# Patient Record
Sex: Female | Born: 1985 | Race: Black or African American | Hispanic: No | Marital: Married | Smoking: Never smoker
Health system: Southern US, Community
[De-identification: ages and names within clinical notes are randomized; demographics above are authoritative.]

## PROBLEM LIST (undated history)

## (undated) ENCOUNTER — Inpatient Hospital Stay (HOSPITAL_COMMUNITY): Payer: Medicaid Other

## (undated) DIAGNOSIS — E109 Type 1 diabetes mellitus without complications: Secondary | ICD-10-CM

## (undated) DIAGNOSIS — E11319 Type 2 diabetes mellitus with unspecified diabetic retinopathy without macular edema: Secondary | ICD-10-CM

## (undated) DIAGNOSIS — B999 Unspecified infectious disease: Secondary | ICD-10-CM

## (undated) DIAGNOSIS — I10 Essential (primary) hypertension: Secondary | ICD-10-CM

## (undated) DIAGNOSIS — T8859XA Other complications of anesthesia, initial encounter: Secondary | ICD-10-CM

## (undated) DIAGNOSIS — N390 Urinary tract infection, site not specified: Secondary | ICD-10-CM

## (undated) HISTORY — PX: OTHER SURGICAL HISTORY: SHX169

## (undated) HISTORY — DX: Type 2 diabetes mellitus with unspecified diabetic retinopathy without macular edema: E11.319

## (undated) HISTORY — DX: Urinary tract infection, site not specified: N39.0

---

## 2016-11-23 HISTORY — PX: FOOT SURGERY: SHX648

## 2017-11-23 HISTORY — PX: HAND SURGERY: SHX662

## 2019-10-26 ENCOUNTER — Other Ambulatory Visit: Payer: Self-pay

## 2019-10-26 ENCOUNTER — Encounter (HOSPITAL_COMMUNITY): Payer: Self-pay | Admitting: Emergency Medicine

## 2019-10-26 ENCOUNTER — Emergency Department (HOSPITAL_COMMUNITY)
Admission: EM | Admit: 2019-10-26 | Discharge: 2019-10-26 | Disposition: A | Discharge status: Home / Self Care | Payer: Self-pay | Attending: Emergency Medicine | Admitting: Emergency Medicine

## 2019-10-26 DIAGNOSIS — N3000 Acute cystitis without hematuria: Secondary | ICD-10-CM | POA: Insufficient documentation

## 2019-10-26 DIAGNOSIS — R739 Hyperglycemia, unspecified: Secondary | ICD-10-CM

## 2019-10-26 DIAGNOSIS — E1065 Type 1 diabetes mellitus with hyperglycemia: Secondary | ICD-10-CM | POA: Insufficient documentation

## 2019-10-26 DIAGNOSIS — R112 Nausea with vomiting, unspecified: Secondary | ICD-10-CM | POA: Insufficient documentation

## 2019-10-26 HISTORY — DX: Type 1 diabetes mellitus without complications: E10.9

## 2019-10-26 LAB — CBC
HCT: 48.7 % — ABNORMAL HIGH (ref 36.0–46.0)
Hemoglobin: 16 g/dL — ABNORMAL HIGH (ref 12.0–15.0)
MCH: 28.4 pg (ref 26.0–34.0)
MCHC: 32.9 g/dL (ref 30.0–36.0)
MCV: 86.3 fL (ref 80.0–100.0)
Platelets: 271 10*3/uL (ref 150–400)
RBC: 5.64 MIL/uL — ABNORMAL HIGH (ref 3.87–5.11)
RDW: 12.9 % (ref 11.5–15.5)
WBC: 5.4 10*3/uL (ref 4.0–10.5)
nRBC: 0 % (ref 0.0–0.2)

## 2019-10-26 LAB — URINALYSIS, ROUTINE W REFLEX MICROSCOPIC
Bilirubin Urine: NEGATIVE
Glucose, UA: >=500 mg/dL — AB
Ketones, ur: 80 mg/dL — AB
Nitrite: POSITIVE — AB
Protein, ur: 30 mg/dL — AB
Specific Gravity, Urine: 1.036 — ABNORMAL HIGH (ref 1.005–1.030)
pH: 5 (ref 5.0–8.0)

## 2019-10-26 LAB — COMPREHENSIVE METABOLIC PANEL
ALT: 13 U/L (ref 0–44)
AST: 12 U/L — ABNORMAL LOW (ref 15–41)
Albumin: 3.8 g/dL (ref 3.5–5.0)
Alkaline Phosphatase: 112 U/L (ref 38–126)
Anion gap: 14 (ref 5–15)
BUN: 12 mg/dL (ref 6–20)
CO2: 20 mmol/L — ABNORMAL LOW (ref 22–32)
Calcium: 9.2 mg/dL (ref 8.9–10.3)
Chloride: 102 mmol/L (ref 98–111)
Creatinine, Ser: 0.55 mg/dL (ref 0.44–1.00)
GFR calc Af Amer: >60 mL/min (ref >60)
GFR calc non Af Amer: >60 mL/min (ref >60)
Glucose, Bld: 386 mg/dL — ABNORMAL HIGH (ref 70–99)
Potassium: 3.8 mmol/L (ref 3.5–5.1)
Sodium: 136 mmol/L (ref 135–145)
Total Bilirubin: 0.8 mg/dL (ref 0.3–1.2)
Total Protein: 7.9 g/dL (ref 6.5–8.1)

## 2019-10-26 LAB — CBG MONITORING, ED
Glucose-Capillary: 365 mg/dL — ABNORMAL HIGH (ref 70–99)
Glucose-Capillary: 315 mg/dL — ABNORMAL HIGH (ref 70–99)
Glucose-Capillary: 263 mg/dL — ABNORMAL HIGH (ref 70–99)

## 2019-10-26 LAB — I-STAT BETA HCG BLOOD, ED (MC, WL, AP ONLY): I-stat hCG, quantitative: <5 m[IU]/mL (ref <5)

## 2019-10-26 LAB — LIPASE, BLOOD: Lipase: 17 U/L (ref 11–51)

## 2019-10-26 MED ORDER — METOCLOPRAMIDE HCL 10 MG PO TABS: 10.0000 mg | ORAL_TABLET | Freq: Four times a day (QID) | ORAL | 0 refills | Status: DC

## 2019-10-26 MED ORDER — ONDANSETRON HCL 4 MG/2ML IJ SOLN
4.0000 mg | Freq: Once | INTRAMUSCULAR | Status: AC
  Administered 2019-10-26: 4 mg via INTRAVENOUS
  Filled 2019-10-26: qty 2

## 2019-10-26 MED ORDER — METOCLOPRAMIDE HCL 5 MG/ML IJ SOLN
10.0000 mg | Freq: Once | INTRAMUSCULAR | Status: AC
  Administered 2019-10-26: 10 mg via INTRAVENOUS
  Filled 2019-10-26: qty 2

## 2019-10-26 MED ORDER — INSULIN ASPART 100 UNIT/ML ~~LOC~~ SOLN
8.0000 [IU] | Freq: Once | SUBCUTANEOUS | Status: AC
  Administered 2019-10-26: 8 [IU] via SUBCUTANEOUS

## 2019-10-26 MED ORDER — SODIUM CHLORIDE 0.9 % IV BOLUS
1000.0000 mL | Freq: Once | INTRAVENOUS | Status: AC
  Administered 2019-10-26: 1000 mL via INTRAVENOUS

## 2019-10-26 MED ORDER — CEPHALEXIN 500 MG PO CAPS: ORAL_CAPSULE | ORAL | 0 refills | Status: DC

## 2019-10-26 NOTE — ED Notes (Signed)
cbg - 263 

## 2019-10-26 NOTE — ED Triage Notes (Signed)
Pt arrives POV for eval of vomiting since last night. Pt reports she is type I diabetic and noted that she started feeling badly last night, endorses dizziness and malaise, denies pain. Reports nearly continuous vomiting since that time. CBG 365 in triage, states she takes subQ insulin, does not wear a pump

## 2019-10-26 NOTE — ED Provider Notes (Signed)
[facility] EMERGENCY DEPARTMENT Provider Note   CSN: [id number] Arrival date & time: 10/26/19  1159     History   Chief Complaint Chief Complaint  Patient presents with  . Emesis    HPI Kristin Clay is a 33 y.o. female presenting for evaluation of nausea and vomiting.   History obtained with [facility] interpretive services as patient speaks Arabic.  Patient states she has had nausea and vomiting for the past 3 days.  Initially, she was having a few episodes of emesis in the morning, but none in the afternoon.  Over the past 24 hours, she has had persistent nausea and vomiting, has not been able to tolerate any p.o.  She denies fevers, chills, chest pain, shortness breath, cough, urinary symptoms, abnormal bowel movements.  She then however states that she is urinating more frequently and does feel very thirsty.  She has not checked her blood sugar in about a day and a half, it was 250 last time she checked.  She is not taken insulin for the past couple days, as she was vomiting and not able to tolerate p.o.  She does have a history of type 1 diabetes, takes insulin for this.  She has no other medical problems, takes no other medications daily.  She denies sick contacts.     HPI  Past Medical History:  Diagnosis Date  . Type I diabetes mellitus (HCC)     There are no active problems to display for this patient.   History reviewed. No pertinent surgical history.   OB History   No obstetric history on file.      Home Medications    Prior to Admission medications   Not on File    Family History History reviewed. No pertinent family history.  Social History Social History   Tobacco Use  . Smoking status: Never Smoker  . Smokeless tobacco: Never Used  Substance Use Topics  . Alcohol use: Never    Frequency: Never  . Drug use: Never     Allergies   Patient has no known allergies.   Review of Systems Review of Systems   Gastrointestinal: Positive for nausea and vomiting.  Endocrine: Positive for polydipsia and polyuria.  All other systems reviewed and are negative.    Physical Exam Updated Vital Signs BP 133/86 (BP Location: Right Arm)   Pulse (!) 105   Temp 98.1 F (36.7 C) (Oral)   Resp 17   Ht 4\' 11"  (1.499 m)   Wt 50 kg   SpO2 100%   BMI 22.26 kg/m   Physical Exam Vitals signs and nursing note reviewed.  Constitutional:      General: She is not in acute distress.    Appearance: She is well-developed.     Comments: Appears tired, but nontoxic.  HENT:     Head: Normocephalic and atraumatic.     Mouth/Throat:     Mouth: Mucous membranes are dry.     Comments: MM dry Eyes:     Extraocular Movements: Extraocular movements intact.     Conjunctiva/sclera: Conjunctivae normal.     Pupils: Pupils are equal, round, and reactive to light.  Neck:     Musculoskeletal: Normal range of motion and neck supple.  Cardiovascular:     Rate and Rhythm: Regular rhythm. Tachycardia present.     Pulses: Normal pulses.     Comments: Tachycardic around 110 Pulmonary:     Effort: Pulmonary effort is normal. No respiratory distress.  Breath sounds: Normal breath sounds. No wheezing.  Abdominal:     General: There is no distension.     Palpations: Abdomen is soft. There is no mass.     Tenderness: There is abdominal tenderness. There is no guarding or rebound.     Comments: ttp of epigastric abd. No rigidity, guarding, distention.  Negative rebound.  No peritonitis.  Musculoskeletal: Normal range of motion.  Skin:    General: Skin is warm and dry.     Capillary Refill: Capillary refill takes less than 2 seconds.  Neurological:     Mental Status: She is alert and oriented to person, place, and time.      ED Treatments / Results  Labs (all labs ordered are listed, but only abnormal results are displayed) Labs Reviewed  COMPREHENSIVE METABOLIC PANEL - Abnormal; Notable for the following  components:      Result Value   CO2 20 (*)    Glucose, Bld 386 (*)    AST 12 (*)    All other components within normal limits  CBC - Abnormal; Notable for the following components:   RBC 5.64 (*)    Hemoglobin 16.0 (*)    HCT 48.7 (*)    All other components within normal limits  CBG MONITORING, ED - Abnormal; Notable for the following components:   Glucose-Capillary 365 (*)    All other components within normal limits  LIPASE, BLOOD  URINALYSIS, ROUTINE W REFLEX MICROSCOPIC  I-STAT BETA HCG BLOOD, ED ([facility], [facility], [facility] ONLY)    EKG None  Radiology No results found.  Procedures Procedures (including critical care time)  Medications Ordered in ED Medications  metoCLOPramide (REGLAN) injection 10 mg (has no administration in time range)  sodium chloride 0.9 % bolus 1,000 mL (1,000 mLs Intravenous New Bag/Given 10/26/19 1252)  ondansetron (ZOFRAN) injection 4 mg (4 mg Intravenous Given 10/26/19 1254)  insulin aspart (novoLOG) injection 8 Units (8 Units Subcutaneous Given 10/26/19 1432)     Initial Impression / Assessment and Plan / ED Course  I have reviewed the triage vital signs and the nursing notes.  Pertinent labs & imaging results that were available during my care of the patient were reviewed by me and considered in my medical decision making (see chart for details).        Pt presenting for evaluation of nausea and vomiting.  Physical exam shows patient who appears nontoxic.  She does appear slightly dehydrated, and is tachycardic.  As she has type 1 diabetes and has not been checking her blood sugar regularly, consider possible DKA.  Also consider HHS.  Consider pancreatitis.  Consider gastroenteritis.  Will obtain labs, give Zofran and fluids for symptom control.  As patient is without significant abdominal pain except with palpation, will hold on imaging at this time.  Labs overall reassuring.  Glucose elevated at 380, but no DKA.  No leukocytosis.  Lipase normal.  I do  not believe she needs CT imaging at this time.  On reassessment, patient continues to have emesis.  Will give Reglan and continue to hydrate.  Insulin given, will recheck cbg in 1 hour.  Repeat CBG improving, 315.  Patient reports improvement with Reglan.  Requesting something to drink, will give water for p.o. challenge.  Tolerating water without difficulty. UA pending. Will likely be able to d/c pt when urine returns and after fluid is finished.   Oncoming team made aware pt's plan for d/c pending UA. Discussed findings and plan with pt, who  agrees to plan.   Final Clinical Impressions(s) / ED Diagnoses   Final diagnoses:  None    ED Discharge Orders    None       Alveria ApleyCaccavale, Nakyiah Kuck, PA-C 10/26/19 1615    Margarita Grizzleay, Danielle, MD 10/27/19 [phone]

## 2019-10-26 NOTE — ED Provider Notes (Signed)
  Patient taken at shift handoff from PA Caccavale Here for n/v Type 1 diabetic. Awaiting urine and repeat cbg.  Patient's labs returned show urinary tract infection.  Urine sent for culture.  Patient being discharged with Reglan.  We will add Keflex for coverage.  No evidence of pyelonephritis.  Patient appears appropriate for discharge at this time.  She is having no active vomiting and feels improved.  Discussed return precautions.  Translator utilized   Margarita Mail, PA-C 10/26/19 2244    Davonna Belling, MD 10/26/19 [phone]

## 2019-10-26 NOTE — ED Notes (Signed)
Patient verbalizes understanding of discharge instructions. Opportunity for questioning and answers were provided. Armband removed by staff, pt discharged from ED ambulatory.   

## 2019-10-26 NOTE — Discharge Instructions (Addendum)
Continue taking insulin as prescribed. Use Reglan as needed for nausea and vomiting. Make sure you are staying hydrated with water. Follow-up with your primary care doctor early next week for reevaluation of your symptoms. Return to the emergency room if you develop persistent vomiting, severe worsening abdominal pain, or any new, worsening, or concerning symptoms.  Contact a health care provider if: Your symptoms do not get better after 1-2 days. Your symptoms go away and then return. Get help right away if you have: Severe pain in your back or your lower abdomen. A fever. Nausea or vomiting.

## 2019-10-27 ENCOUNTER — Other Ambulatory Visit: Payer: Self-pay

## 2019-10-27 ENCOUNTER — Inpatient Hospital Stay (HOSPITAL_COMMUNITY)
Admission: EM | Admit: 2019-10-27 | Discharge: 2019-10-29 | DRG: 638 | Disposition: A | Discharge status: Home / Self Care | Payer: Self-pay | Attending: Family Medicine | Admitting: Family Medicine

## 2019-10-27 ENCOUNTER — Encounter (HOSPITAL_COMMUNITY): Payer: Self-pay | Admitting: Student

## 2019-10-27 DIAGNOSIS — N898 Other specified noninflammatory disorders of vagina: Secondary | ICD-10-CM | POA: Diagnosis present

## 2019-10-27 DIAGNOSIS — E1110 Type 2 diabetes mellitus with ketoacidosis without coma: Secondary | ICD-10-CM | POA: Diagnosis present

## 2019-10-27 DIAGNOSIS — B9620 Unspecified Escherichia coli [E. coli] as the cause of diseases classified elsewhere: Secondary | ICD-10-CM | POA: Diagnosis present

## 2019-10-27 DIAGNOSIS — Y92009 Unspecified place in unspecified non-institutional (private) residence as the place of occurrence of the external cause: Secondary | ICD-10-CM

## 2019-10-27 DIAGNOSIS — Z23 Encounter for immunization: Secondary | ICD-10-CM

## 2019-10-27 DIAGNOSIS — Z794 Long term (current) use of insulin: Secondary | ICD-10-CM

## 2019-10-27 DIAGNOSIS — T383X6A Underdosing of insulin and oral hypoglycemic [antidiabetic] drugs, initial encounter: Secondary | ICD-10-CM | POA: Diagnosis present

## 2019-10-27 DIAGNOSIS — Z91128 Patient's intentional underdosing of medication regimen for other reason: Secondary | ICD-10-CM

## 2019-10-27 DIAGNOSIS — R55 Syncope and collapse: Secondary | ICD-10-CM | POA: Insufficient documentation

## 2019-10-27 DIAGNOSIS — E1043 Type 1 diabetes mellitus with diabetic autonomic (poly)neuropathy: Secondary | ICD-10-CM | POA: Diagnosis present

## 2019-10-27 DIAGNOSIS — Z91018 Allergy to other foods: Secondary | ICD-10-CM

## 2019-10-27 DIAGNOSIS — E860 Dehydration: Secondary | ICD-10-CM | POA: Diagnosis present

## 2019-10-27 DIAGNOSIS — R112 Nausea with vomiting, unspecified: Secondary | ICD-10-CM | POA: Diagnosis present

## 2019-10-27 DIAGNOSIS — N390 Urinary tract infection, site not specified: Secondary | ICD-10-CM | POA: Diagnosis present

## 2019-10-27 DIAGNOSIS — T3696XA Underdosing of unspecified systemic antibiotic, initial encounter: Secondary | ICD-10-CM | POA: Diagnosis present

## 2019-10-27 DIAGNOSIS — E1010 Type 1 diabetes mellitus with ketoacidosis without coma: Principal | ICD-10-CM | POA: Diagnosis present

## 2019-10-27 DIAGNOSIS — Z20828 Contact with and (suspected) exposure to other viral communicable diseases: Secondary | ICD-10-CM | POA: Diagnosis present

## 2019-10-27 DIAGNOSIS — K3184 Gastroparesis: Secondary | ICD-10-CM | POA: Diagnosis present

## 2019-10-27 LAB — POCT I-STAT EG7
Acid-base deficit: 8 mmol/L — ABNORMAL HIGH (ref 0.0–2.0)
Bicarbonate: 15.1 mmol/L — ABNORMAL LOW (ref 20.0–28.0)
Calcium, Ion: 1.07 mmol/L — ABNORMAL LOW (ref 1.15–1.40)
HCT: 46 % (ref 36.0–46.0)
Hemoglobin: 15.6 g/dL — ABNORMAL HIGH (ref 12.0–15.0)
O2 Saturation: 99 %
Potassium: 3.7 mmol/L (ref 3.5–5.1)
Sodium: 140 mmol/L (ref 135–145)
TCO2: 16 mmol/L — ABNORMAL LOW (ref 22–32)
pCO2, Ven: 25.7 mmHg — ABNORMAL LOW (ref 44.0–60.0)
pH, Ven: 7.376 (ref 7.250–7.430)
pO2, Ven: 159 mmHg — ABNORMAL HIGH (ref 32.0–45.0)

## 2019-10-27 LAB — COMPREHENSIVE METABOLIC PANEL
ALT: 14 U/L (ref 0–44)
AST: 16 U/L (ref 15–41)
Albumin: 3.8 g/dL (ref 3.5–5.0)
Alkaline Phosphatase: 106 U/L (ref 38–126)
Anion gap: 18 — ABNORMAL HIGH (ref 5–15)
BUN: 11 mg/dL (ref 6–20)
CO2: 13 mmol/L — ABNORMAL LOW (ref 22–32)
Calcium: 8.4 mg/dL — ABNORMAL LOW (ref 8.9–10.3)
Chloride: 105 mmol/L (ref 98–111)
Creatinine, Ser: 0.49 mg/dL (ref 0.44–1.00)
GFR calc Af Amer: >60 mL/min (ref >60)
GFR calc non Af Amer: >60 mL/min (ref >60)
Glucose, Bld: 289 mg/dL — ABNORMAL HIGH (ref 70–99)
Potassium: 3.7 mmol/L (ref 3.5–5.1)
Sodium: 136 mmol/L (ref 135–145)
Total Bilirubin: 1 mg/dL (ref 0.3–1.2)
Total Protein: 7.7 g/dL (ref 6.5–8.1)

## 2019-10-27 LAB — CBC WITH DIFFERENTIAL/PLATELET
Abs Immature Granulocytes: 0.09 10*3/uL — ABNORMAL HIGH (ref 0.00–0.07)
Basophils Absolute: 0 10*3/uL (ref 0.0–0.1)
Basophils Relative: 0 %
Eosinophils Absolute: 0 10*3/uL (ref 0.0–0.5)
Eosinophils Relative: 0 %
HCT: 46.2 % — ABNORMAL HIGH (ref 36.0–46.0)
Hemoglobin: 15.4 g/dL — ABNORMAL HIGH (ref 12.0–15.0)
Immature Granulocytes: 1 %
Lymphocytes Relative: 9 %
Lymphs Abs: 0.8 10*3/uL (ref 0.7–4.0)
MCH: 28.7 pg (ref 26.0–34.0)
MCHC: 33.3 g/dL (ref 30.0–36.0)
MCV: 86.2 fL (ref 80.0–100.0)
Monocytes Absolute: 0.2 10*3/uL (ref 0.1–1.0)
Monocytes Relative: 3 %
Neutro Abs: 7.6 10*3/uL (ref 1.7–7.7)
Neutrophils Relative %: 87 %
Platelets: 287 10*3/uL (ref 150–400)
RBC: 5.36 MIL/uL — ABNORMAL HIGH (ref 3.87–5.11)
RDW: 13 % (ref 11.5–15.5)
WBC: 8.7 10*3/uL (ref 4.0–10.5)
nRBC: 0 % (ref 0.0–0.2)

## 2019-10-27 LAB — URINALYSIS, ROUTINE W REFLEX MICROSCOPIC
Bilirubin Urine: NEGATIVE
Glucose, UA: >=500 mg/dL — AB
Hgb urine dipstick: NEGATIVE
Ketones, ur: 80 mg/dL — AB
Leukocytes,Ua: NEGATIVE
Nitrite: NEGATIVE
Protein, ur: 100 mg/dL — AB
Specific Gravity, Urine: 1.028 (ref 1.005–1.030)
pH: 5 (ref 5.0–8.0)

## 2019-10-27 LAB — I-STAT BETA HCG BLOOD, ED (MC, WL, AP ONLY): I-stat hCG, quantitative: <5 m[IU]/mL (ref <5)

## 2019-10-27 LAB — CBG MONITORING, ED
Glucose-Capillary: 259 mg/dL — ABNORMAL HIGH (ref 70–99)
Glucose-Capillary: 238 mg/dL — ABNORMAL HIGH (ref 70–99)
Glucose-Capillary: 216 mg/dL — ABNORMAL HIGH (ref 70–99)

## 2019-10-27 LAB — LIPASE, BLOOD: Lipase: 19 U/L (ref 11–51)

## 2019-10-27 MED ORDER — ENOXAPARIN SODIUM 40 MG/0.4ML ~~LOC~~ SOLN: 40.0000 mg | SUBCUTANEOUS | Status: DC

## 2019-10-27 MED ORDER — ENOXAPARIN SODIUM 40 MG/0.4ML ~~LOC~~ SOLN: 40.0000 mg | Freq: Every day | SUBCUTANEOUS | Status: DC

## 2019-10-27 MED ORDER — DEXTROSE 50 % IV SOLN: 0.0000 mL | INTRAVENOUS | Status: DC | PRN

## 2019-10-27 MED ORDER — FAMOTIDINE IN NACL 20-0.9 MG/50ML-% IV SOLN
20.0000 mg | Freq: Once | INTRAVENOUS | Status: AC
  Administered 2019-10-27: 20 mg via INTRAVENOUS
  Filled 2019-10-27: qty 50

## 2019-10-27 MED ORDER — SODIUM CHLORIDE 0.9 % IV SOLN: 1.0000 g | INTRAVENOUS | Status: DC

## 2019-10-27 MED ORDER — SODIUM CHLORIDE 0.9 % IV SOLN
1.0000 g | Freq: Once | INTRAVENOUS | Status: AC
  Administered 2019-10-27: 22:00:00 1 g via INTRAVENOUS
  Filled 2019-10-27: qty 10

## 2019-10-27 MED ORDER — DEXTROSE-NACL 5-0.45 % IV SOLN
INTRAVENOUS | Status: DC
  Administered 2019-10-27: 75 mL/h via INTRAVENOUS

## 2019-10-27 MED ORDER — METOCLOPRAMIDE HCL 5 MG/ML IJ SOLN
10.0000 mg | Freq: Once | INTRAMUSCULAR | Status: AC
  Administered 2019-10-27: 10 mg via INTRAVENOUS
  Filled 2019-10-27: qty 2

## 2019-10-27 MED ORDER — POTASSIUM CHLORIDE 10 MEQ/100ML IV SOLN
10.0000 meq | INTRAVENOUS | Status: AC
  Administered 2019-10-27 (×2): 10 meq via INTRAVENOUS
  Filled 2019-10-27 (×2): qty 100

## 2019-10-27 MED ORDER — ONDANSETRON HCL 4 MG/2ML IJ SOLN
4.0000 mg | Freq: Once | INTRAMUSCULAR | Status: AC
  Administered 2019-10-27: 4 mg via INTRAVENOUS
  Filled 2019-10-27: qty 2

## 2019-10-27 MED ORDER — INSULIN REGULAR(HUMAN) IN NACL 100-0.9 UT/100ML-% IV SOLN
INTRAVENOUS | Status: DC
  Administered 2019-10-27: 22:00:00 4.6 [IU]/h via INTRAVENOUS
  Filled 2019-10-27: qty 100

## 2019-10-27 MED ORDER — SODIUM CHLORIDE 0.9 % IV BOLUS
1000.0000 mL | Freq: Once | INTRAVENOUS | Status: AC
  Administered 2019-10-27: 1000 mL via INTRAVENOUS

## 2019-10-27 MED ORDER — INSULIN REGULAR(HUMAN) IN NACL 100-0.9 UT/100ML-% IV SOLN
INTRAVENOUS | Status: AC
  Administered 2019-10-28: 08:00:00 0.9 [IU]/h via INTRAVENOUS

## 2019-10-27 MED ORDER — SODIUM CHLORIDE 0.9 % IV SOLN
INTRAVENOUS | Status: DC
  Administered 2019-10-27: 22:00:00 via INTRAVENOUS

## 2019-10-27 MED ORDER — DEXTROSE-NACL 5-0.45 % IV SOLN
INTRAVENOUS | Status: DC
  Administered 2019-10-28 (×2): via INTRAVENOUS

## 2019-10-27 MED ORDER — SODIUM CHLORIDE 0.9 % IV SOLN: INTRAVENOUS | Status: DC

## 2019-10-27 NOTE — ED Provider Notes (Signed)
[place] EMERGENCY DEPARTMENT Provider Note   CSN: [id number] Arrival date & time: 10/27/19  1816     History   Chief Complaint Chief Complaint  Patient presents with  . Loss of Consciousness  . Emesis    HPI Kristin Clay is a 33 y.o. female with a hx of T1DM who presents to the ED via EMS for evaluation of N/V x 3 days with 2 syncopal episodes this evening shortly PTA. Patient states she has had TNTC episodes of emesis over the past 3 days, she has been unable to keep anything down. She reports she has some mild upper abdominal soreness with this that is worse with vomiting, no alleviating factors. Has noted increased thirst with dysuria. Seen in the ED yesterday, found to have UTI, not in DKA, discharged home with reglan & keflex. She has not filled/taken these prescriptions yet. She felt a bit better this morning then started vomiting again. She has felt generally weak/fatigued. After vomiting and feeling very nauseous she became lightheaded & had a syncopal episode. She went downstairs and began to feel lightheaded and had a second syncope episode. Each episodes of LOC were brief. Her significant other is at bedside and states he witnessed these events and she did not have any head injury. Per EMS patient was initially responsive but nonverbal, on my assessment she is communicating appropriately. She denies fever, URI sxs, cough, dyspnea, chest pain, diarrhea, melena, hematochezia, constipation, urinary frequency, or vaginal discharge.   Interpretor utilized throughout encounter   HPI  Past Medical History:  Diagnosis Date  . Type I diabetes mellitus (HCC)     There are no active problems to display for this patient.   No past surgical history on file.   OB History   No obstetric history on file.      Home Medications    Prior to Admission medications   Medication Sig Start Date End Date Taking? Authorizing Provider  cephALEXin  (KEFLEX) 500 MG capsule 2 caps po bid x 7 days 10/26/19   Margarita Mail, PA-C  metoCLOPramide (REGLAN) 10 MG tablet Take 1 tablet (10 mg total) by mouth every 6 (six) hours. 10/26/19   Caccavale, Sophia, PA-C    Family History No family history on file.  Social History Social History   Tobacco Use  . Smoking status: Never Smoker  . Smokeless tobacco: Never Used  Substance Use Topics  . Alcohol use: Never    Frequency: Never  . Drug use: Never     Allergies   Patient has no known allergies.   Review of Systems Review of Systems  Constitutional: Positive for fatigue. Negative for fever.  HENT: Negative for congestion, ear pain and sore throat.   Eyes: Negative for photophobia.  Respiratory: Negative for cough and shortness of breath.   Cardiovascular: Negative for chest pain.  Gastrointestinal: Positive for abdominal pain, nausea and vomiting. Negative for anal bleeding, blood in stool, constipation and diarrhea.  Endocrine: Positive for polydipsia.  Genitourinary: Positive for dysuria. Negative for frequency, vaginal bleeding and vaginal discharge.  Neurological: Positive for syncope, weakness (generalized) and light-headedness. Negative for seizures and headaches.  All other systems reviewed and are negative.    Physical Exam Updated Vital Signs BP (!) 144/97   Physical Exam Vitals signs and nursing note reviewed.  Constitutional:      General: She is not in acute distress.    Appearance: She is well-developed. She is ill-appearing. She is not toxic-appearing.  HENT:     Head: Normocephalic and atraumatic.     Comments: No raccoon eyes or battle sign.     Mouth/Throat:     Mouth: Mucous membranes are dry.  Eyes:     General:        Right eye: No discharge.        Left eye: No discharge.     Extraocular Movements: Extraocular movements intact.     Conjunctiva/sclera: Conjunctivae normal.     Pupils: Pupils are equal, round, and reactive to light.  Neck:      Musculoskeletal: Neck supple.  Cardiovascular:     Rate and Rhythm: Regular rhythm. Tachycardia present.  Pulmonary:     Effort: Tachypnea present. No respiratory distress.     Breath sounds: Normal breath sounds. No wheezing, rhonchi or rales.  Abdominal:     General: There is no distension.     Palpations: Abdomen is soft.     Tenderness: There is abdominal tenderness (diffuse upper abdomen, most prominent in the epigastrium). There is no guarding or rebound. Negative signs include Murphy's sign and McBurney's sign.  Skin:    General: Skin is warm and dry.     Findings: No rash.  Neurological:     Mental Status: She is alert.     Comments: Clear speech. CN III-XII grossly intact. Moving all extremities.   Psychiatric:        Behavior: Behavior normal.    ED Treatments / Results  Labs (all labs ordered are listed, but only abnormal results are displayed) Labs Reviewed  COMPREHENSIVE METABOLIC PANEL - Abnormal; Notable for the following components:      Result Value   CO2 13 (*)    Glucose, Bld 289 (*)    Calcium 8.4 (*)    Anion gap 18 (*)    All other components within normal limits  CBC WITH DIFFERENTIAL/PLATELET - Abnormal; Notable for the following components:   RBC 5.36 (*)    Hemoglobin 15.4 (*)    HCT 46.2 (*)    Abs Immature Granulocytes 0.09 (*)    All other components within normal limits  CBG MONITORING, ED - Abnormal; Notable for the following components:   Glucose-Capillary 259 (*)    All other components within normal limits  POCT I-STAT EG7 - Abnormal; Notable for the following components:   pCO2, Ven 25.7 (*)    pO2, Ven 159.0 (*)    Bicarbonate 15.1 (*)    TCO2 16 (*)    Acid-base deficit 8.0 (*)    Calcium, Ion 1.07 (*)    Hemoglobin 15.6 (*)    All other components within normal limits  URINE CULTURE  SARS CORONAVIRUS 2 (TAT 6-24 HRS)  LIPASE, BLOOD  URINALYSIS, ROUTINE W REFLEX MICROSCOPIC  BETA-HYDROXYBUTYRIC ACID  BETA-HYDROXYBUTYRIC ACID   I-STAT BETA HCG BLOOD, ED ([facility], [facility], [facility] ONLY)  CBG MONITORING, ED    EKG EKG Interpretation  Date/Time:  Friday October 27 2019 19:28:42 EST Ventricular Rate:  90 PR Interval:    QRS Duration: 86 QT Interval:  366 QTC Calculation: 448 R Axis:   54 Text Interpretation: Sinus rhythm RSR' in V1 or V2, right VCD or RVH No previous ECGs available Confirmed by Glynn Octave [phone]) on 10/27/2019 7:36:19 PM   Radiology No results found.  Procedures .Critical Care Performed by: Cherly Anderson, PA-C Authorized by: Cherly Anderson, PA-C    CRITICAL CARE Performed by: Harvie Heck   Total critical care time: 30 minutes  Critical care time was exclusive of separately billable procedures and treating other patients.  Critical care was necessary to treat or prevent imminent or life-threatening deterioration.  Critical care was time spent personally by me on the following activities: development of treatment plan with patient and/or surrogate as well as nursing, discussions with consultants, evaluation of patient's response to treatment, examination of patient, obtaining history from patient or surrogate, ordering and performing treatments and interventions, ordering and review of laboratory studies, ordering and review of radiographic studies, pulse oximetry and re-evaluation of patient's condition.    (including critical care time)  Medications Ordered in ED Medications  cefTRIAXone (ROCEPHIN) 1 g in sodium chloride 0.9 % 100 mL IVPB (1 g Intravenous New Bag/Given 10/27/19 2145)  insulin regular, human (MYXREDLIN) 100 units/ 100 mL infusion (has no administration in time range)  0.9 %  sodium chloride infusion ( Intravenous New Bag/Given 10/27/19 2152)  dextrose 5 %-0.45 % sodium chloride infusion (has no administration in time range)  dextrose 50 % solution 0-50 mL (has no administration in time range)  potassium chloride 10 mEq in 100 mL IVPB (has no  administration in time range)  sodium chloride 0.9 % bolus 1,000 mL (0 mLs Intravenous Stopped 10/27/19 2059)  famotidine (PEPCID) IVPB 20 mg premix (0 mg Intravenous Stopped 10/27/19 1956)  ondansetron (ZOFRAN) injection 4 mg (4 mg Intravenous Given 10/27/19 1921)     Initial Impression / Assessment and Plan / ED Course  I have reviewed the triage vital signs and the nursing notes.  Pertinent labs & imaging results that were available during my care of the patient were reviewed by me and considered in my medical decision making (see chart for details).   Patient returns to the ED for continued N/V & 2 syncopal episodes.  Patient is tachycardic and mildly tachypneic but does not appear in respiratory distress.  She has some epigastric abdominal tenderness without peritoneal signs.  Concern for DKA plan for labs, fluids, antiemetics, and antiacid.  CBC: No leukocytosis or leukopenia.  No anemia.  Hgb/HCT elevated likely hemoconcentration. CMP: Hyperglycemia at 289 bicarb is low at 13, anion gap is elevated at 18.  Renal function WNL.  LFTs WNL. Lipase: WNL VBG: pH is normal, however bicarb and CO2 are both low. Pregnancy test: Negative Urinalysis: Pending--> reviewed urinalysis from yesterday's visit, consistent with infection, she also had 80 ketones in her urine yesterday. EKG: No STEMI.   Given patient's hyperglycemia, low bicarb, and elevated anion gap with ketonuria yesterday there is concern for DKA.  Possibly secondary to UTI.  Will start glucose stabilizer consult hospitalist service for admission.  Findings and plan of care discussed with supervising physician Dr. Manus Gunningancour who has evaluated the patient & is in agreement.   21:49: CONSULT: Discussed with hospitalist Dr. Toniann FailKakrakandy who accepts admission.   Kristin ibrahim [place] [facility] was evaluated in Emergency Department on 10/27/2019 for the symptoms described in the history of present illness. He/she was evaluated in the context of the  global COVID-19 pandemic, which necessitated consideration that the patient might be at risk for infection with the SARS-CoV-2 virus that causes COVID-19. Institutional protocols and algorithms that pertain to the evaluation of patients at risk for COVID-19 are in a state of rapid change based on information released by regulatory bodies including the CDC and federal and state organizations. These policies and algorithms were followed during the patient's care in the ED.  Final Clinical Impressions(s) / ED Diagnoses   Final diagnoses:  Diabetic  ketoacidosis without coma associated with type 1 diabetes mellitus (HCC)  Syncope, unspecified syncope type    ED Discharge Orders    None       Cherly Anderson, PA-C 10/27/19 2200    Glynn Octave, MD 10/28/19 0126

## 2019-10-27 NOTE — H&P (Addendum)
History and Physical    Kristin StanfordFatima ibrahim H Clay WUJ:[id number]:[id number] DOB: 02/22/1986 DOA: 10/27/2019  PCP: Patient, No Pcp Per  Patient coming from: Home.  Print production plannerArabic translator used.  Chief Complaint: Nausea vomiting epigastric pain.  HPI: Kristin StanfordFatima ibrahim H Clay is a 33 y.o. female with history of diabetes mellitus type 1 has been on insulin for last 13 years presents to the ER because of persistent nausea vomiting over the last 3 days.  Come to the ER 3 days ago was diagnosed with UTI and sent home on antibiotics.  Patient has been not able to take it because of persistent vomiting.  Pain is mostly in epigastric area.  Patient states she has been recently married and over the last 2 months has been having off-and-on some vaginal discharge and last few days has been having dysuria.  No pelvic pain.  No fever chills.  Since patient has benign persistent vomiting patient has not taken her insulin last 3 days.  Denies any diarrhea.  Pain is mostly dull pain nonradiating in the epigastrium denies any chest pain or shortness of breath.  Patient states she also passed out twice after throwing up.  Did not have any seizure-like activities did not have any incontinence of urine.  ED Course: In the ER on exam patient has mild epigastric tenderness UA shows features concerning for UTI with ketosis.  Recent UA done showed cultures of E. coli.  Sensitivities are pending.  Labs show bicarb of 13 with anion gap of 18 concerning for diabetic ketoacidosis for which patient was started on IV fluids and IV insulin.  IV ceftriaxone for UTI.  Given the abdominal pain and persistent vomiting CT abdomen pelvis has been ordered along with chlamydia and gonorrhea probes.  Review of Systems: As per HPI, rest all negative.   Past Medical History:  Diagnosis Date  . Type I diabetes mellitus (HCC)     History reviewed. No pertinent surgical history.   reports that she has never smoked. She has never used smokeless  tobacco. She reports that she does not drink alcohol or use drugs.  No Known Allergies  Family History  Family history unknown: Yes    Prior to Admission medications   Medication Sig Start Date End Date Taking? Authorizing Provider  insulin detemir (LEVEMIR) 100 UNIT/ML injection Inject 20-40 Units into the skin See admin instructions. 40 units every morning and 20 units every night   Yes [provider]  metoCLOPramide (REGLAN) 10 MG tablet Take 1 tablet (10 mg total) by mouth every 6 (six) hours. 10/26/19  Yes Caccavale, Sophia, PA-C    Physical Exam: Constitutional: Moderately built and nourished.  Temperature was 97.4.  Respiration and pulse was 80/min. Vitals:   10/27/19 2215 10/27/19 2230 10/27/19 2245 10/27/19 2300  BP: (!) 144/92 124/69 134/82 (!) 139/104  Pulse:      Resp: 17 16 18 16   Temp:      TempSrc:      SpO2:       Eyes: Anicteric no pallor. ENMT: No discharge from the ears eyes nose or mouth. Neck: No mass felt.  No neck rigidity. Respiratory: No rhonchi or crepitations. Cardiovascular: S1-S2 heard. Abdomen: Soft mild epigastric tenderness no guarding or rigidity. Musculoskeletal: No edema. Skin: No rash. Neurologic: Alert awake oriented to time place and person.  Moves all extremities. Psychiatric: Appears normal per normal affect.   Labs on Admission: I have personally reviewed following labs and imaging studies  CBC: Recent Labs  Lab 10/26/19 1216 10/27/19 1922 10/27/19 1936  WBC 5.4 8.7  --   NEUTROABS  --  7.6  --   HGB 16.0* 15.4* 15.6*  HCT 48.7* 46.2* 46.0  MCV 86.3 86.2  --   PLT 271 287  --    Basic Metabolic Panel: Recent Labs  Lab 10/26/19 1216 10/27/19 1922 10/27/19 1936  NA 136 136 140  K 3.8 3.7 3.7  CL 102 105  --   CO2 20* 13*  --   GLUCOSE 386* 289*  --   BUN 12 11  --   CREATININE 0.55 0.49  --   CALCIUM 9.2 8.4*  --    GFR: Estimated Creatinine Clearance: 67.6 mL/min (by C-G formula based on SCr of 0.49  mg/dL). Liver Function Tests: Recent Labs  Lab 10/26/19 1216 10/27/19 1922  AST 12* 16  ALT 13 14  ALKPHOS 112 106  BILITOT 0.8 1.0  PROT 7.9 7.7  ALBUMIN 3.8 3.8   Recent Labs  Lab 10/26/19 1216 10/27/19 1922  LIPASE 17 19   No results for input(s): AMMONIA in the last 168 hours. Coagulation Profile: No results for input(s): INR, PROTIME in the last 168 hours. Cardiac Enzymes: No results for input(s): CKTOTAL, CKMB, CKMBINDEX, TROPONINI in the last 168 hours. BNP (last 3 results) No results for input(s): PROBNP in the last 8760 hours. HbA1C: No results for input(s): HGBA1C in the last 72 hours. CBG: Recent Labs  Lab 10/26/19 1208 10/26/19 1520 10/26/19 1625 10/27/19 1932 10/27/19 2202  GLUCAP 365* 315* 263* 259* 238*   Lipid Profile: No results for input(s): CHOL, HDL, LDLCALC, TRIG, CHOLHDL, LDLDIRECT in the last 72 hours. Thyroid Function Tests: No results for input(s): TSH, T4TOTAL, FREET4, T3FREE, THYROIDAB in the last 72 hours. Anemia Panel: No results for input(s): VITAMINB12, FOLATE, FERRITIN, TIBC, IRON, RETICCTPCT in the last 72 hours. Urine analysis:    Component Value Date/Time   COLORURINE YELLOW 10/27/2019 2142   APPEARANCEUR HAZY (A) 10/27/2019 2142   LABSPEC 1.028 10/27/2019 2142   PHURINE 5.0 10/27/2019 2142   GLUCOSEU >=500 (A) 10/27/2019 2142   HGBUR NEGATIVE 10/27/2019 2142   BILIRUBINUR NEGATIVE 10/27/2019 2142   KETONESUR 80 (A) 10/27/2019 2142   PROTEINUR 100 (A) 10/27/2019 2142   NITRITE NEGATIVE 10/27/2019 2142   LEUKOCYTESUR NEGATIVE 10/27/2019 2142   Sepsis Labs: @LABRCNTIP (procalcitonin:4,lacticidven:4) ) Recent Results (from the past 240 hour(s))  Urine culture     Status: Abnormal (Preliminary result)   Collection Time: 10/26/19  5:12 PM   Specimen: Urine, Random  Result Value Ref Range Status   Specimen Description URINE, RANDOM  Final   Special Requests Normal  Final   Culture (A)  Final    80,000 COLONIES/mL  ESCHERICHIA COLI SUSCEPTIBILITIES TO FOLLOW Performed at [facility], [address] [place], [place]    Report Status PENDING  Incomplete     Radiological Exams on Admission: No results found.  EKG: Independently reviewed.  Normal sinus rhythm.  Assessment/Plan Principal Problem:   DKA (diabetic ketoacidoses) (HCC) Active Problems:   Nausea & vomiting   Acute lower UTI    1. Diabetic ketoacidosis likely precipitated by patient not taking her medication due to vomiting.  Presently patient is on IV insulin infusion and IV fluids.  Change long-acting insulin once anion gap is corrected.  Hemoglobin A1c is pending. 2. Intractable nausea vomiting with epigastric pain likely could be either from UTI or gastroparesis.  Will follow CT abdomen pelvis. 3.  Syncope likely could be from vasovagal after vomiting.  EKG does not show any prolonged QT.  Closely monitoring telemetry. 4. UTI with dysuria and mild vaginal discharge for which I have placed patient on ceftriaxone recent urine culture done 2 days ago showed E. coli.  Sensitivities are pending.  Check chlamydia gonorrhea probe and for CAT scan.   COVID-19 test was negative.  Addendum -CT of the abdomen pelvis came unremarkable.  Discussed with on-call OB/GYN Dr. Rosana Hoes about patient's vaginal discharge and dysuria.  Since patient's CAT scan does not show any acute changes and patient is afebrile and no pelvic pain Dr. Rosana Hoes advised that patient symptoms are unlikely to be PID.  DVT prophylaxis: Lovenox. Code Status: Full code. Family Communication: Discussed with patient. Disposition Plan: Home. Consults called: Discussed with gynecologist. Admission status: Observation.   Rise Patience MD [facility] Pager [phone].  If 7PM-7AM, please contact night-coverage [email or web address] Password [email or web address]  10/27/2019, 11:22 PM

## 2019-10-27 NOTE — ED Triage Notes (Addendum)
Arrives by EMS. EMS reports pt was found on floor by neighbors. Pt was responsive but non verbal.  Pt did sit up on own and shove her finger down her throat in an attempt to make herself vomit. During transport pt became alert and began removing all monitoring devices. CBG 336 Bp: 146/80 HR 100 100% RA RR 24

## 2019-10-28 ENCOUNTER — Encounter (HOSPITAL_COMMUNITY): Payer: Self-pay | Admitting: Radiology

## 2019-10-28 ENCOUNTER — Observation Stay (HOSPITAL_COMMUNITY): Payer: Self-pay

## 2019-10-28 DIAGNOSIS — R42 Dizziness and giddiness: Secondary | ICD-10-CM

## 2019-10-28 DIAGNOSIS — R1013 Epigastric pain: Secondary | ICD-10-CM

## 2019-10-28 DIAGNOSIS — K3184 Gastroparesis: Secondary | ICD-10-CM

## 2019-10-28 DIAGNOSIS — N898 Other specified noninflammatory disorders of vagina: Secondary | ICD-10-CM

## 2019-10-28 DIAGNOSIS — E1143 Type 2 diabetes mellitus with diabetic autonomic (poly)neuropathy: Secondary | ICD-10-CM

## 2019-10-28 DIAGNOSIS — B9620 Unspecified Escherichia coli [E. coli] as the cause of diseases classified elsewhere: Secondary | ICD-10-CM

## 2019-10-28 LAB — HEMOGLOBIN A1C
Hgb A1c MFr Bld: 13 % — ABNORMAL HIGH (ref 4.8–5.6)
Mean Plasma Glucose: 326.4 mg/dL

## 2019-10-28 LAB — BASIC METABOLIC PANEL
Anion gap: 12 (ref 5–15)
Anion gap: 8 (ref 5–15)
Anion gap: 9 (ref 5–15)
Anion gap: 12 (ref 5–15)
Anion gap: 10 (ref 5–15)
BUN: 8 mg/dL (ref 6–20)
BUN: 8 mg/dL (ref 6–20)
BUN: 7 mg/dL (ref 6–20)
BUN: <5 mg/dL — ABNORMAL LOW (ref 6–20)
BUN: <5 mg/dL — ABNORMAL LOW (ref 6–20)
CO2: 14 mmol/L — ABNORMAL LOW (ref 22–32)
CO2: 17 mmol/L — ABNORMAL LOW (ref 22–32)
CO2: 18 mmol/L — ABNORMAL LOW (ref 22–32)
CO2: 18 mmol/L — ABNORMAL LOW (ref 22–32)
CO2: 19 mmol/L — ABNORMAL LOW (ref 22–32)
Calcium: 7.5 mg/dL — ABNORMAL LOW (ref 8.9–10.3)
Calcium: 6.9 mg/dL — ABNORMAL LOW (ref 8.9–10.3)
Calcium: 7.5 mg/dL — ABNORMAL LOW (ref 8.9–10.3)
Calcium: 8 mg/dL — ABNORMAL LOW (ref 8.9–10.3)
Calcium: 7.5 mg/dL — ABNORMAL LOW (ref 8.9–10.3)
Chloride: 111 mmol/L (ref 98–111)
Chloride: 113 mmol/L — ABNORMAL HIGH (ref 98–111)
Chloride: 110 mmol/L (ref 98–111)
Chloride: 106 mmol/L (ref 98–111)
Chloride: 104 mmol/L (ref 98–111)
Creatinine, Ser: 0.45 mg/dL (ref 0.44–1.00)
Creatinine, Ser: 0.38 mg/dL — ABNORMAL LOW (ref 0.44–1.00)
Creatinine, Ser: 0.45 mg/dL (ref 0.44–1.00)
Creatinine, Ser: 0.39 mg/dL — ABNORMAL LOW (ref 0.44–1.00)
Creatinine, Ser: 0.36 mg/dL — ABNORMAL LOW (ref 0.44–1.00)
GFR calc Af Amer: >60 mL/min (ref >60)
GFR calc Af Amer: >60 mL/min (ref >60)
GFR calc Af Amer: >60 mL/min (ref >60)
GFR calc Af Amer: >60 mL/min (ref >60)
GFR calc Af Amer: >60 mL/min (ref >60)
GFR calc non Af Amer: >60 mL/min (ref >60)
GFR calc non Af Amer: >60 mL/min (ref >60)
GFR calc non Af Amer: >60 mL/min (ref >60)
GFR calc non Af Amer: >60 mL/min (ref >60)
GFR calc non Af Amer: >60 mL/min (ref >60)
Glucose, Bld: 135 mg/dL — ABNORMAL HIGH (ref 70–99)
Glucose, Bld: 184 mg/dL — ABNORMAL HIGH (ref 70–99)
Glucose, Bld: 189 mg/dL — ABNORMAL HIGH (ref 70–99)
Glucose, Bld: 199 mg/dL — ABNORMAL HIGH (ref 70–99)
Glucose, Bld: 193 mg/dL — ABNORMAL HIGH (ref 70–99)
Potassium: 3.5 mmol/L (ref 3.5–5.1)
Potassium: 4.7 mmol/L (ref 3.5–5.1)
Potassium: 3.7 mmol/L (ref 3.5–5.1)
Potassium: 3.1 mmol/L — ABNORMAL LOW (ref 3.5–5.1)
Potassium: 3.4 mmol/L — ABNORMAL LOW (ref 3.5–5.1)
Sodium: 137 mmol/L (ref 135–145)
Sodium: 138 mmol/L (ref 135–145)
Sodium: 137 mmol/L (ref 135–145)
Sodium: 136 mmol/L (ref 135–145)
Sodium: 133 mmol/L — ABNORMAL LOW (ref 135–145)

## 2019-10-28 LAB — URINE CULTURE
Culture: 80000 — AB
Special Requests: NORMAL

## 2019-10-28 LAB — CBC
HCT: 45.5 % (ref 36.0–46.0)
Hemoglobin: 14.5 g/dL (ref 12.0–15.0)
MCH: 28.4 pg (ref 26.0–34.0)
MCHC: 31.9 g/dL (ref 30.0–36.0)
MCV: 89.2 fL (ref 80.0–100.0)
Platelets: 197 10*3/uL (ref 150–400)
RBC: 5.1 MIL/uL (ref 3.87–5.11)
RDW: 13.2 % (ref 11.5–15.5)
WBC: 9.3 10*3/uL (ref 4.0–10.5)
nRBC: 0 % (ref 0.0–0.2)

## 2019-10-28 LAB — CBG MONITORING, ED
Glucose-Capillary: 122 mg/dL — ABNORMAL HIGH (ref 70–99)
Glucose-Capillary: 128 mg/dL — ABNORMAL HIGH (ref 70–99)
Glucose-Capillary: 128 mg/dL — ABNORMAL HIGH (ref 70–99)
Glucose-Capillary: 148 mg/dL — ABNORMAL HIGH (ref 70–99)
Glucose-Capillary: 161 mg/dL — ABNORMAL HIGH (ref 70–99)
Glucose-Capillary: 172 mg/dL — ABNORMAL HIGH (ref 70–99)
Glucose-Capillary: 180 mg/dL — ABNORMAL HIGH (ref 70–99)
Glucose-Capillary: 231 mg/dL — ABNORMAL HIGH (ref 70–99)
Glucose-Capillary: 232 mg/dL — ABNORMAL HIGH (ref 70–99)

## 2019-10-28 LAB — GLUCOSE, CAPILLARY
Glucose-Capillary: 191 mg/dL — ABNORMAL HIGH (ref 70–99)
Glucose-Capillary: 181 mg/dL — ABNORMAL HIGH (ref 70–99)
Glucose-Capillary: 187 mg/dL — ABNORMAL HIGH (ref 70–99)
Glucose-Capillary: 208 mg/dL — ABNORMAL HIGH (ref 70–99)
Glucose-Capillary: 243 mg/dL — ABNORMAL HIGH (ref 70–99)
Glucose-Capillary: 200 mg/dL — ABNORMAL HIGH (ref 70–99)
Glucose-Capillary: 178 mg/dL — ABNORMAL HIGH (ref 70–99)
Glucose-Capillary: 173 mg/dL — ABNORMAL HIGH (ref 70–99)
Glucose-Capillary: 143 mg/dL — ABNORMAL HIGH (ref 70–99)

## 2019-10-28 LAB — PHOSPHORUS: Phosphorus: 1.3 mg/dL — ABNORMAL LOW (ref 2.5–4.6)

## 2019-10-28 LAB — HIV ANTIBODY (ROUTINE TESTING W REFLEX): HIV Screen 4th Generation wRfx: NONREACTIVE

## 2019-10-28 LAB — MAGNESIUM: Magnesium: 2 mg/dL (ref 1.7–2.4)

## 2019-10-28 LAB — MRSA PCR SCREENING: MRSA by PCR: NEGATIVE

## 2019-10-28 LAB — SARS CORONAVIRUS 2 (TAT 6-24 HRS): SARS Coronavirus 2: NEGATIVE

## 2019-10-28 LAB — BETA-HYDROXYBUTYRIC ACID
Beta-Hydroxybutyric Acid: 1.88 mmol/L — ABNORMAL HIGH (ref 0.05–0.27)
Beta-Hydroxybutyric Acid: 2.59 mmol/L — ABNORMAL HIGH (ref 0.05–0.27)
Beta-Hydroxybutyric Acid: 1.41 mmol/L — ABNORMAL HIGH (ref 0.05–0.27)

## 2019-10-28 IMAGING — CT CT ABD-PELV W/ CM
2 of 4 series · 15 of 46 positions shown, 17 images · IV contrast (omnipaque)
Comparison: None.

CLINICAL DATA: Initial evaluation for acute epigastric abdominal
pain with nausea and vomiting.

EXAM:
CT ABDOMEN AND PELVIS WITH CONTRAST
TECHNIQUE: Multidetector CT imaging of the abdomen and pelvis was performed
using the standard protocol following bolus administration of
intravenous contrast.
CONTRAST:  80mL OMNIPAQUE IOHEXOL 300 MG/ML  SOLN

[Series 3: abdomen 5.0 · axial · 0.70mm/px · z∈[+880,+1255]mm · 12 of 89 slices shown, 14 images]
[im 7/89  soft-tissue]
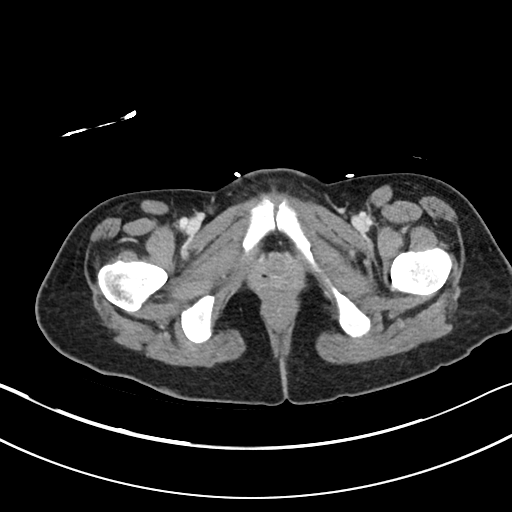
[im 7/89  bone]
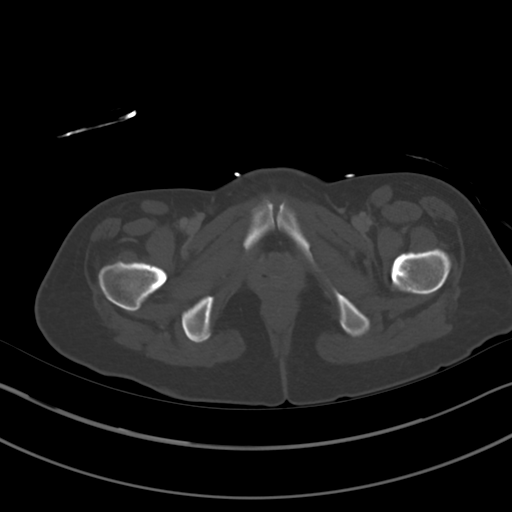
[im 13/89  soft-tissue]
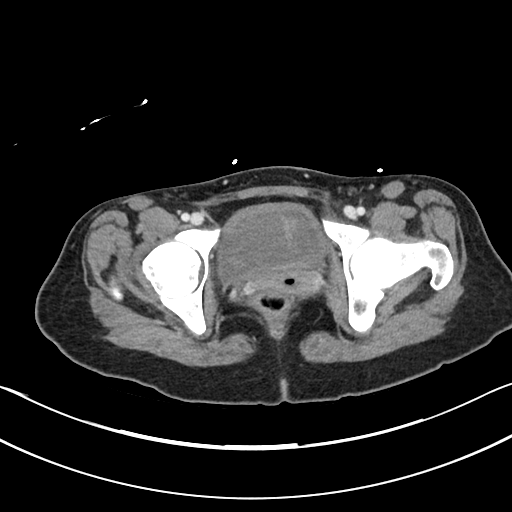
[im 19/89  soft-tissue]
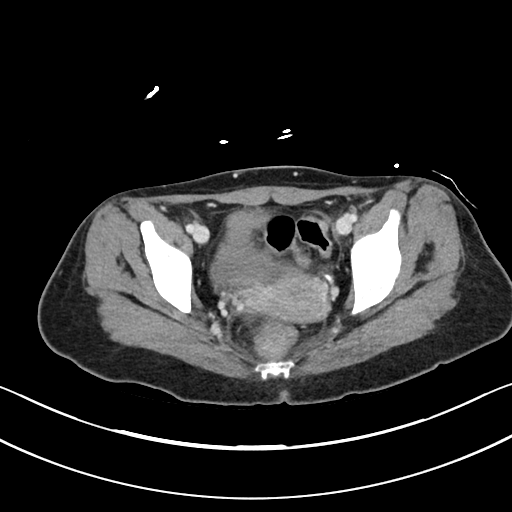
[im 26/89  soft-tissue]
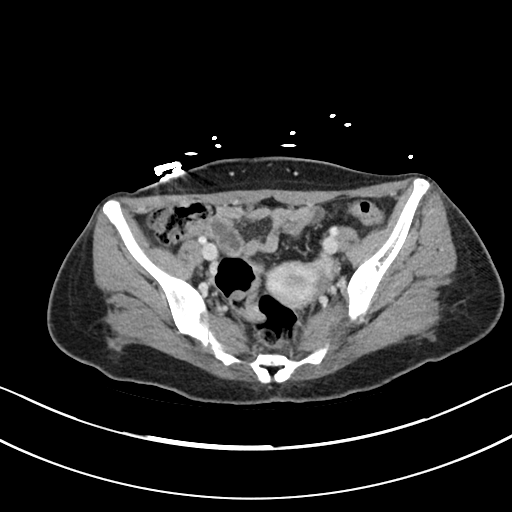
[im 32/89  soft-tissue]
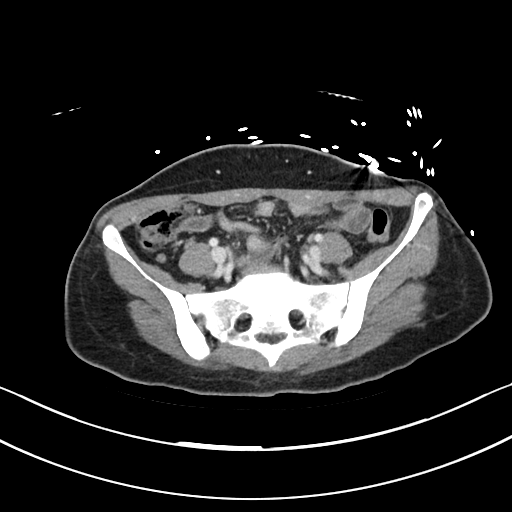
[im 38/89  soft-tissue]
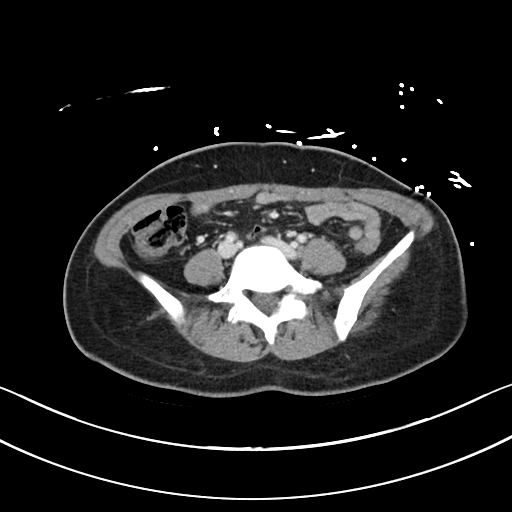
[im 51/89  soft-tissue]
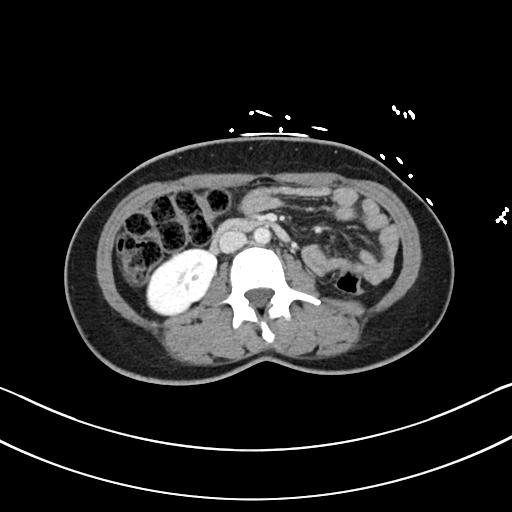
[im 57/89  soft-tissue]
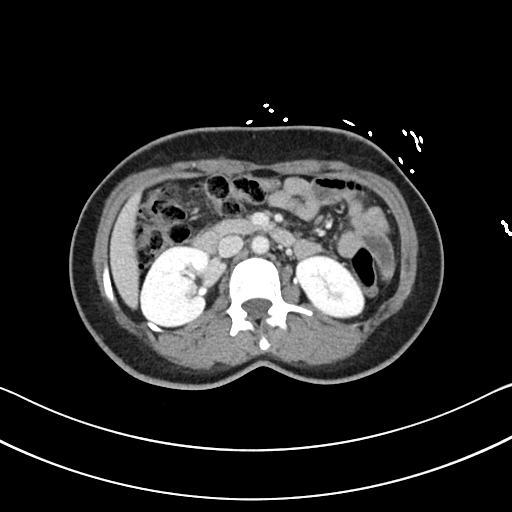
[im 63/89  soft-tissue]
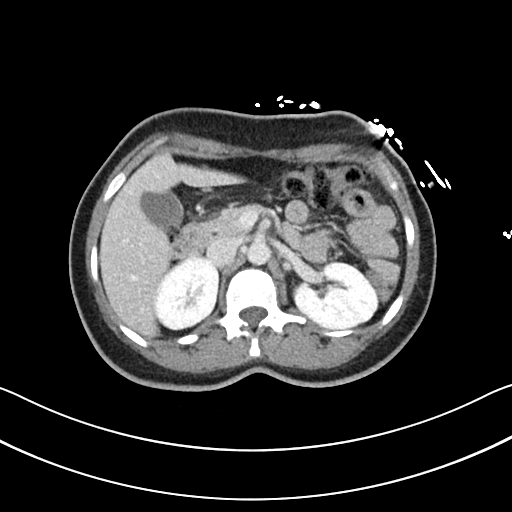
[im 63/89  bone]
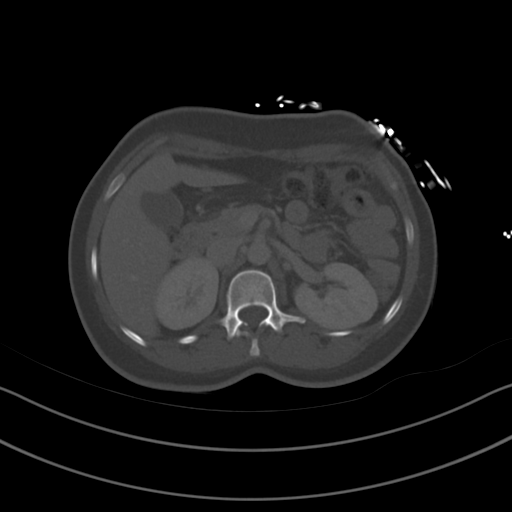
[im 70/89  soft-tissue]
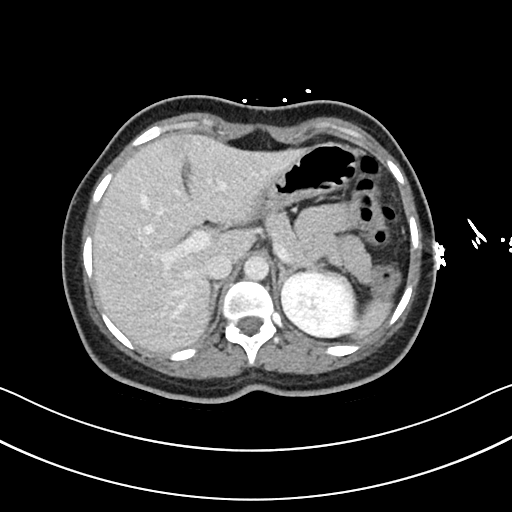
[im 76/89  soft-tissue]
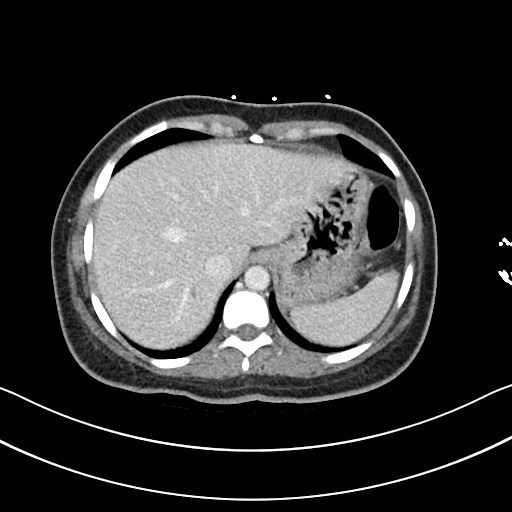
[im 82/89  soft-tissue]
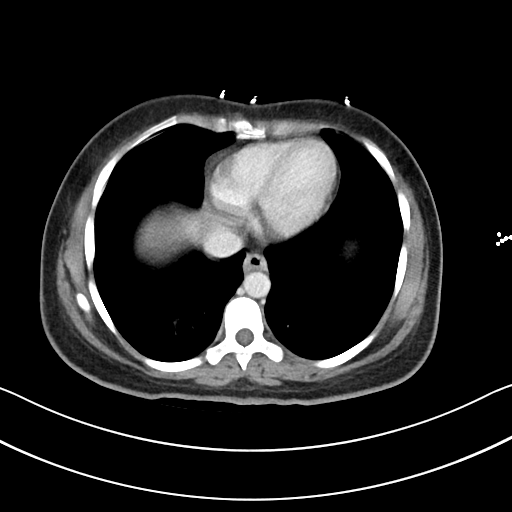

[Series 6: abdomen 3.0 mpr cor · coronal · 0.57mm/px · 3 of 68 slices shown]
[im 23/68  soft-tissue]
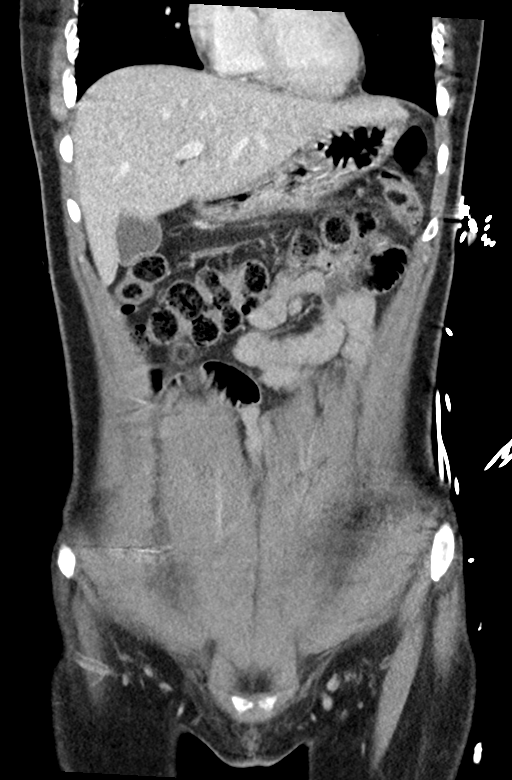
[im 30/68  soft-tissue]
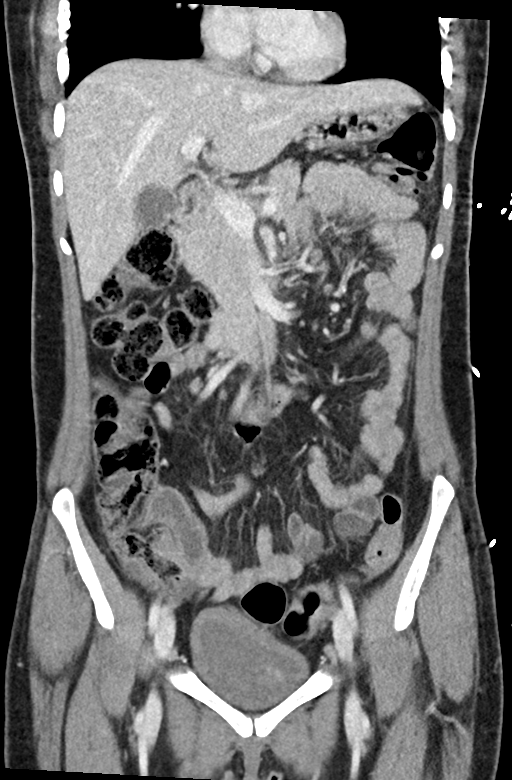
[im 38/68  soft-tissue]
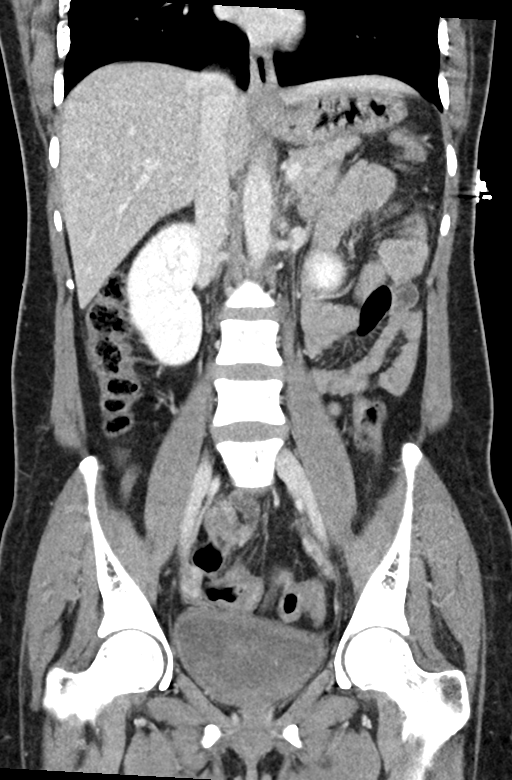

[15 of 46 positions shown; findings below may reference images not displayed]

FINDINGS: Lower chest: Partially visualized lung bases are clear.

Hepatobiliary: Subcentimeter hypodensity noted within the inferior
right hepatic lobe, too small the characterize, but could reflect a
small cyst. Finding of doubtful significance. Liver otherwise
unremarkable. Gallbladder within normal limits. No biliary
dilatation.

Pancreas: Pancreas within normal limits.

Spleen: Spleen within normal limits.

Adrenals/Urinary Tract: Adrenal glands are normal. Kidneys equal
size with symmetric enhancement. No nephrolithiasis, hydronephrosis,
or focal enhancing renal mass. No hydroureter. Partially distended
bladder within normal limits.

Stomach/Bowel: Stomach within normal limits. No evidence for bowel
obstruction. Normal appendix. No acute plan [REDACTED] changes seen
about the bowels.

Vascular/Lymphatic: Normal intravascular enhancement seen throughout
the intra-abdominal aorta. Mesenteric vessels patent proximally. No
adenopathy.

Reproductive: Uterus within normal limits. Left ovary normal. 2.3 cm
right ovarian cyst, indeterminate, but could reflect a small
physiologic follicular cyst or possibly hemorrhagic cyst.

Other: No free air or fluid.

Musculoskeletal: No acute osseous abnormality. No discrete lytic or
blastic osseous lesions.
IMPRESSION: 1. No CT evidence for acute intra-abdominal or pelvic process.
2. Normal appendix.
3. 2.3 cm right ovarian cyst, indeterminate, but could reflect a
small physiologic follicular cyst or possibly hemorrhagic cyst.
Finding could be further assessed with dedicated pelvic ultrasound
as clinically warranted.

## 2019-10-28 MED ORDER — BOOST / RESOURCE BREEZE PO LIQD CUSTOM
1.0000 | Freq: Three times a day (TID) | ORAL | Status: DC
  Administered 2019-10-28 – 2019-10-29 (×2): 1 via ORAL

## 2019-10-28 MED ORDER — PNEUMOCOCCAL VAC POLYVALENT 25 MCG/0.5ML IJ INJ
0.5000 mL | INJECTION | INTRAMUSCULAR | Status: AC
  Administered 2019-10-29: 0.5 mL via INTRAMUSCULAR
  Filled 2019-10-28: qty 0.5

## 2019-10-28 MED ORDER — INSULIN ASPART 100 UNIT/ML ~~LOC~~ SOLN
3.0000 [IU] | Freq: Three times a day (TID) | SUBCUTANEOUS | Status: DC
  Administered 2019-10-28 – 2019-10-29 (×3): 3 [IU] via SUBCUTANEOUS

## 2019-10-28 MED ORDER — INSULIN ASPART 100 UNIT/ML ~~LOC~~ SOLN
0.0000 [IU] | Freq: Three times a day (TID) | SUBCUTANEOUS | Status: DC
  Administered 2019-10-28: 3 [IU] via SUBCUTANEOUS
  Administered 2019-10-29: 5 [IU] via SUBCUTANEOUS
  Administered 2019-10-29: 8 [IU] via SUBCUTANEOUS

## 2019-10-28 MED ORDER — INFLUENZA VAC SPLIT QUAD 0.5 ML IM SUSY
0.5000 mL | PREFILLED_SYRINGE | INTRAMUSCULAR | Status: AC
  Administered 2019-10-29: 0.5 mL via INTRAMUSCULAR
  Filled 2019-10-28: qty 0.5

## 2019-10-28 MED ORDER — ALUM & MAG HYDROXIDE-SIMETH 200-200-20 MG/5ML PO SUSP
30.0000 mL | Freq: Three times a day (TID) | ORAL | Status: DC | PRN
  Administered 2019-10-28: 10:00:00 30 mL via ORAL
  Filled 2019-10-28: qty 30

## 2019-10-28 MED ORDER — POTASSIUM CHLORIDE 10 MEQ/100ML IV SOLN: 10.0000 meq | INTRAVENOUS | Status: DC

## 2019-10-28 MED ORDER — LIDOCAINE 5 % EX PTCH
1.0000 | MEDICATED_PATCH | CUTANEOUS | Status: DC
  Administered 2019-10-28 – 2019-10-29 (×2): 1 via TRANSDERMAL
  Filled 2019-10-28 (×2): qty 1

## 2019-10-28 MED ORDER — ONDANSETRON HCL 4 MG/2ML IJ SOLN
4.0000 mg | Freq: Once | INTRAMUSCULAR | Status: AC
  Administered 2019-10-28: 4 mg via INTRAVENOUS
  Filled 2019-10-28: qty 2

## 2019-10-28 MED ORDER — IOHEXOL 300 MG/ML  SOLN
80.0000 mL | Freq: Once | INTRAMUSCULAR | Status: AC | PRN
  Administered 2019-10-28: 80 mL via INTRAVENOUS

## 2019-10-28 MED ORDER — INSULIN DETEMIR 100 UNIT/ML ~~LOC~~ SOLN
12.0000 [IU] | Freq: Two times a day (BID) | SUBCUTANEOUS | Status: DC
  Administered 2019-10-28 – 2019-10-29 (×3): 12 [IU] via SUBCUTANEOUS
  Filled 2019-10-28 (×4): qty 0.12

## 2019-10-28 MED ORDER — INSULIN ASPART 100 UNIT/ML ~~LOC~~ SOLN: 0.0000 [IU] | Freq: Every day | SUBCUTANEOUS | Status: DC

## 2019-10-28 MED ORDER — PANTOPRAZOLE SODIUM 40 MG PO TBEC
40.0000 mg | DELAYED_RELEASE_TABLET | Freq: Every day | ORAL | Status: DC
  Administered 2019-10-28 – 2019-10-29 (×2): 40 mg via ORAL
  Filled 2019-10-28 (×2): qty 1

## 2019-10-28 MED ORDER — CEFAZOLIN SODIUM-DEXTROSE 1-4 GM/50ML-% IV SOLN
1.0000 g | Freq: Three times a day (TID) | INTRAVENOUS | Status: DC
  Administered 2019-10-28 – 2019-10-29 (×2): 1 g via INTRAVENOUS
  Filled 2019-10-28 (×5): qty 50

## 2019-10-28 MED ORDER — POTASSIUM CHLORIDE CRYS ER 20 MEQ PO TBCR
40.0000 meq | EXTENDED_RELEASE_TABLET | ORAL | Status: AC
  Administered 2019-10-28 (×3): 40 meq via ORAL
  Filled 2019-10-28 (×3): qty 2

## 2019-10-28 MED ORDER — POTASSIUM CHLORIDE IN NACL 20-0.9 MEQ/L-% IV SOLN
INTRAVENOUS | Status: DC
  Administered 2019-10-28: 16:00:00 via INTRAVENOUS
  Filled 2019-10-28: qty 1000

## 2019-10-28 MED ORDER — METOCLOPRAMIDE HCL 5 MG/ML IJ SOLN
5.0000 mg | Freq: Three times a day (TID) | INTRAMUSCULAR | Status: DC
  Administered 2019-10-28 – 2019-10-29 (×4): 5 mg via INTRAVENOUS
  Filled 2019-10-28 (×4): qty 2

## 2019-10-28 NOTE — Progress Notes (Signed)
Chaplain responded to consult for Muslim spiritual rites. Chaplain is working on getting appropriate spiritual care provider for [place]. Chaplain left a prayer shawl and Koran in [place] with the NT to be given to [place]. Chaplains remain available for support as needs arise.   Chaplain Resident, Evelene Croon, [place] [phone]

## 2019-10-28 NOTE — ED Notes (Signed)
ED TO INPATIENT HANDOFF REPORT  ED Nurse Name and Phone #: Caryn Bee RN  S Name/Age/Gender Kristin Clay 33 y.o. female Room/Bed: [phone]  Code Status   Code Status: Full Code  Home/SNF/Other Home Patient oriented to: self, place, time and situation Is this baseline? Yes   Triage Complete: Triage complete  Chief Complaint AMS  Triage Note Arrives by EMS. EMS reports pt was found on floor by neighbors. Pt was responsive but non verbal.  Pt did sit up on own and shove her finger down her throat in an attempt to make herself vomit. During transport pt became alert and began removing all monitoring devices. CBG 336 Bp: 146/80 HR 100 100% RA RR 24   Allergies No Known Allergies  Level of Care/Admitting Diagnosis ED Disposition    ED Disposition Condition Comment   Admit  Hospital Area: [facility] [[id number]]  Level of Care: Progressive [102]  Admit to Progressive based on following criteria: MULTISYSTEM THREATS such as stable sepsis, metabolic/electrolyte imbalance with or without encephalopathy that is responding to early treatment.  Covid Evaluation: Asymptomatic Screening Protocol (No Symptoms)  Diagnosis: DKA (diabetic ketoacidoses) [facility]) [[id number]]  Admitting Physician: Eduard Clos [phone]  Attending Physician: Almon Hercules [[id number]]  Estimated length of stay: past midnight tomorrow  Certification:: I certify this patient will need inpatient services for at least 2 midnights  PT Class (Do Not Modify): Inpatient [101]  PT Acc Code (Do Not Modify): Private [1]       B Medical/Surgery History Past Medical History:  Diagnosis Date  . Type I diabetes mellitus (HCC)    History reviewed. No pertinent surgical history.   A IV Location/Drains/Wounds Patient Lines/Drains/Airways Status   Active Line/Drains/Airways    Name:   Placement date:   Placement time:   Site:   Days:   Peripheral IV 10/27/19 Left Antecubital   10/27/19     1820    Antecubital   1          Intake/Output Last 24 hours  Intake/Output Summary (Last 24 hours) at 10/28/2019 1055 Last data filed at 10/28/2019 0754 Gross per 24 hour  Intake 2570.5 ml  Output -  Net 2570.5 ml    Labs/Imaging Results for orders placed or performed during the hospital encounter of 10/27/19 (from the past 48 hour(s))  Comprehensive metabolic panel     Status: Abnormal   Collection Time: 10/27/19  7:22 PM  Result Value Ref Range   Sodium 136 135 - 145 mmol/L   Potassium 3.7 3.5 - 5.1 mmol/L   Chloride 105 98 - 111 mmol/L   CO2 13 (L) 22 - 32 mmol/L   Glucose, Bld 289 (H) 70 - 99 mg/dL   BUN 11 6 - 20 mg/dL   Creatinine, Ser 4.01 0.44 - 1.00 mg/dL   Calcium 8.4 (L) 8.9 - 10.3 mg/dL   Total Protein 7.7 6.5 - 8.1 g/dL   Albumin 3.8 3.5 - 5.0 g/dL   AST 16 15 - 41 U/L   ALT 14 0 - 44 U/L   Alkaline Phosphatase 106 38 - 126 U/L   Total Bilirubin 1.0 0.3 - 1.2 mg/dL   GFR calc non Af Amer >60 >60 mL/min   GFR calc Af Amer >60 >60 mL/min   Anion gap 18 (H) 5 - 15    Comment: Performed at [facility], [address], [place], [place] [address]  Lipase, blood     Status: None  Collection Time: 10/27/19  7:22 PM  Result Value Ref Range   Lipase 19 11 - 51 U/L    Comment: Performed at [facility], [place] [address], [place], [place] [address]  CBC with Differential     Status: Abnormal   Collection Time: 10/27/19  7:22 PM  Result Value Ref Range   WBC 8.7 4.0 - 10.5 K/uL   RBC 5.36 (H) 3.87 - 5.11 MIL/uL   Hemoglobin 15.4 (H) 12.0 - 15.0 g/dL   HCT 46.2 (H) 36.0 - 46.0 %   MCV 86.2 80.0 - 100.0 fL   MCH 28.7 26.0 - 34.0 pg   MCHC 33.3 30.0 - 36.0 g/dL   RDW 13.0 11.5 - 15.5 %   Platelets 287 150 - 400 K/uL   nRBC 0.0 0.0 - 0.2 %   Neutrophils Relative % 87 %   Neutro Abs 7.6 1.7 - 7.7 K/uL   Lymphocytes Relative 9 %   Lymphs Abs 0.8 0.7 - 4.0 K/uL   Monocytes Relative 3 %   Monocytes Absolute 0.2 0.1 - 1.0 K/uL   Eosinophils Relative 0  %   Eosinophils Absolute 0.0 0.0 - 0.5 K/uL   Basophils Relative 0 %   Basophils Absolute 0.0 0.0 - 0.1 K/uL   Immature Granulocytes 1 %   Abs Immature Granulocytes 0.09 (H) 0.00 - 0.07 K/uL    Comment: Performed at [facility] [address] [place], [place] [address]  POC CBG, ED     Status: Abnormal   Collection Time: 10/27/19  7:32 PM  Result Value Ref Range   Glucose-Capillary 259 (H) 70 - 99 mg/dL  I-Stat beta hCG blood, ED     Status: None   Collection Time: 10/27/19  7:33 PM  Result Value Ref Range   I-stat hCG, quantitative <5.0 <5 mIU/mL   Comment 3            Comment:   GEST. AGE      CONC.  (mIU/mL)   <=1 WEEK        5 - 50     2 WEEKS       50 - 500     3 WEEKS       100 - 10,000     4 WEEKS     1,000 - 30,000        FEMALE AND NON-PREGNANT FEMALE:     LESS THAN 5 mIU/mL   POCT I-Stat EG7     Status: Abnormal   Collection Time: 10/27/19  7:36 PM  Result Value Ref Range   pH, Ven 7.376 7.250 - 7.430   pCO2, Ven 25.7 (L) 44.0 - 60.0 mmHg   pO2, Ven 159.0 (H) 32.0 - 45.0 mmHg   Bicarbonate 15.1 (L) 20.0 - 28.0 mmol/L   TCO2 16 (L) 22 - 32 mmol/L   O2 Saturation 99.0 %   Acid-base deficit 8.0 (H) 0.0 - 2.0 mmol/L   Sodium 140 135 - 145 mmol/L   Potassium 3.7 3.5 - 5.1 mmol/L   Calcium, Ion 1.07 (L) 1.15 - 1.40 mmol/L   HCT 46.0 36.0 - 46.0 %   Hemoglobin 15.6 (H) 12.0 - 15.0 g/dL   Patient temperature HIDE    Collection site BRACHIAL ARTERY    Sample type VENOUS   SARS CORONAVIRUS 2 (TAT 6-24 HRS) Nasopharyngeal Nasopharyngeal Swab     Status: None   Collection Time: 10/27/19  9:20 PM   Specimen: Nasopharyngeal Swab  Result Value Ref Range   SARS Coronavirus 2 NEGATIVE NEGATIVE    Comment: (NOTE) SARS-CoV-2 target nucleic acids are NOT DETECTED. The SARS-CoV-2 RNA is generally detectable in upper and lower respiratory specimens during the acute phase of infection. Negative results do not preclude SARS-CoV-2 infection, do not rule out co-infections  with other pathogens, and should not be used as the sole basis for treatment or other patient management decisions. Negative results must be combined with clinical observations, patient history, and epidemiological information. The expected result is Negative. Fact Sheet for Patients: HairSlick.no Fact Sheet for Healthcare Providers: quierodirigir.com This test is not yet approved or cleared by the [place] FDA and  has been authorized for detection and/or diagnosis of SARS-CoV-2 by FDA under an Emergency Use Authorization (EUA). This EUA will remain  in effect (meaning this test can be used) for the duration of the COVID-19 declaration under Section 56 4(b)(1) of the Act, 21 U.S.C. section 360bbb-3(b)(1), unless the authorization is terminated or revoked sooner. Performed at [facility], [address] [place], [place] [address]   Urinalysis, Routine w reflex microscopic     Status: Abnormal   Collection Time: 10/27/19  9:42 PM  Result Value Ref Range   Color, Urine YELLOW YELLOW   APPearance HAZY (A) CLEAR   Specific Gravity, Urine 1.028 1.005 - 1.030   pH 5.0 5.0 - 8.0   Glucose, UA >=500 (A) NEGATIVE mg/dL   Hgb urine dipstick NEGATIVE NEGATIVE   Bilirubin Urine NEGATIVE NEGATIVE   Ketones, ur 80 (A) NEGATIVE mg/dL   Protein, ur 366 (A) NEGATIVE mg/dL   Nitrite NEGATIVE NEGATIVE   Leukocytes,Ua NEGATIVE NEGATIVE   RBC / HPF 0-5 0 - 5 RBC/hpf   WBC, UA 0-5 0 - 5 WBC/hpf   Bacteria, UA RARE (A) NONE SEEN   Squamous Epithelial / LPF 0-5 0 - 5    Comment: Performed at [facility], [address] [place], [place] [address]  CBG monitoring, ED     Status: Abnormal   Collection Time: 10/27/19 10:02 PM  Result Value Ref Range   Glucose-Capillary 238 (H) 70 - 99 mg/dL  CBG monitoring, ED     Status: Abnormal   Collection Time: 10/27/19 11:05 PM  Result Value Ref Range   Glucose-Capillary 216 (H) 70 - 99  mg/dL  CBG monitoring, ED     Status: Abnormal   Collection Time: 10/28/19 12:19 AM  Result Value Ref Range   Glucose-Capillary 128 (H) 70 - 99 mg/dL  CBG monitoring, ED     Status: Abnormal   Collection Time: 10/28/19  1:36 AM  Result Value Ref Range   Glucose-Capillary 122 (H) 70 - 99 mg/dL  Beta-hydroxybutyric acid     Status: Abnormal   Collection Time: 10/28/19  2:30 AM  Result Value Ref Range   Beta-Hydroxybutyric Acid 1.88 (H) 0.05 - 0.27 mmol/L    Comment: Performed at [facility], [address], [place], [place] [address]  CBC     Status: None   Collection Time: 10/28/19  2:30 AM  Result Value Ref Range   WBC 9.3 4.0 - 10.5 K/uL   RBC 5.10 3.87 - 5.11 MIL/uL   Hemoglobin 14.5 12.0 - 15.0 g/dL   HCT 35.4 65.6 - 81.2 %   MCV 89.2 80.0 - 100.0 fL   MCH 28.4 26.0 - 34.0 pg   MCHC 31.9 30.0 - 36.0 g/dL   RDW 75.1 70.0 - 17.4 %  Platelets 197 150 - 400 K/uL    Comment: REPEATED TO VERIFY PLATELET COUNT CONFIRMED BY SMEAR SPECIMEN CHECKED FOR CLOTS    nRBC 0.0 0.0 - 0.2 %    Comment: Performed at [facility], [address], [place], [place] [address]  Basic metabolic panel     Status: Abnormal   Collection Time: 10/28/19  2:30 AM  Result Value Ref Range   Sodium 137 135 - 145 mmol/L   Potassium 3.5 3.5 - 5.1 mmol/L   Chloride 111 98 - 111 mmol/L   CO2 14 (L) 22 - 32 mmol/L   Glucose, Bld 135 (H) 70 - 99 mg/dL   BUN 8 6 - 20 mg/dL   Creatinine, Ser 1.91 0.44 - 1.00 mg/dL   Calcium 7.5 (L) 8.9 - 10.3 mg/dL   GFR calc non Af Amer >60 >60 mL/min   GFR calc Af Amer >60 >60 mL/min   Anion gap 12 5 - 15    Comment: Performed at [facility], [address], [place], [place] [address]  Hemoglobin A1c     Status: Abnormal   Collection Time: 10/28/19  2:30 AM  Result Value Ref Range   Hgb A1c MFr Bld 13.0 (H) 4.8 - 5.6 %    Comment: (NOTE) Pre diabetes:          5.7%-6.4% Diabetes:              >6.4% Glycemic control for   <7.0% adults with  diabetes    Mean Plasma Glucose 326.4 mg/dL    Comment: Performed at [facility], [address], [place], [place] [address]  CBG monitoring, ED     Status: Abnormal   Collection Time: 10/28/19  3:08 AM  Result Value Ref Range   Glucose-Capillary 128 (H) 70 - 99 mg/dL  CBG monitoring, ED     Status: Abnormal   Collection Time: 10/28/19  4:08 AM  Result Value Ref Range   Glucose-Capillary 148 (H) 70 - 99 mg/dL  Beta-hydroxybutyric acid     Status: Abnormal   Collection Time: 10/28/19  4:22 AM  Result Value Ref Range   Beta-Hydroxybutyric Acid 2.59 (H) 0.05 - 0.27 mmol/L    Comment: Performed at [facility], [address], [place], [place] [address]  Basic metabolic panel     Status: Abnormal   Collection Time: 10/28/19  4:22 AM  Result Value Ref Range   Sodium 138 135 - 145 mmol/L   Potassium 4.7 3.5 - 5.1 mmol/L    Comment: SPECIMEN HEMOLYZED. HEMOLYSIS MAY AFFECT INTEGRITY OF RESULTS.   Chloride 113 (H) 98 - 111 mmol/L   CO2 17 (L) 22 - 32 mmol/L   Glucose, Bld 184 (H) 70 - 99 mg/dL   BUN 8 6 - 20 mg/dL   Creatinine, Ser 8.46 (L) 0.44 - 1.00 mg/dL   Calcium 6.9 (L) 8.9 - 10.3 mg/dL   GFR calc non Af Amer >60 >60 mL/min   GFR calc Af Amer >60 >60 mL/min   Anion gap 8 5 - 15    Comment: Performed at [facility], [address], [place], [place] [address]  CBG monitoring, ED     Status: Abnormal   Collection Time: 10/28/19  5:30 AM  Result Value Ref Range   Glucose-Capillary 172 (H) 70 - 99 mg/dL  CBG monitoring, ED     Status: Abnormal   Collection Time: 10/28/19  6:24 AM  Result  Value Ref Range   Glucose-Capillary 232 (H) 70 - 99 mg/dL  CBG monitoring, ED     Status: Abnormal   Collection Time: 10/28/19  7:42 AM  Result Value Ref Range   Glucose-Capillary 161 (H) 70 - 99 mg/dL  CBG monitoring, ED     Status: Abnormal   Collection Time: 10/28/19  8:51 AM  Result Value Ref Range   Glucose-Capillary 180 (H) 70 - 99 mg/dL  Basic metabolic panel      Status: Abnormal   Collection Time: 10/28/19  8:59 AM  Result Value Ref Range   Sodium 137 135 - 145 mmol/L   Potassium 3.7 3.5 - 5.1 mmol/L   Chloride 110 98 - 111 mmol/L   CO2 18 (L) 22 - 32 mmol/L   Glucose, Bld 189 (H) 70 - 99 mg/dL   BUN 7 6 - 20 mg/dL   Creatinine, Ser 1.610.45 0.44 - 1.00 mg/dL   Calcium 7.5 (L) 8.9 - 10.3 mg/dL   GFR calc non Af Amer >60 >60 mL/min   GFR calc Af Amer >60 >60 mL/min   Anion gap 9 5 - 15    Comment: Performed at [facility], [address] [place], [place] [address]  CBG monitoring, ED     Status: Abnormal   Collection Time: 10/28/19 10:05 AM  Result Value Ref Range   Glucose-Capillary 231 (H) 70 - 99 mg/dL   Ct Abdomen Pelvis W Contrast  Result Date: 10/28/2019 CLINICAL DATA:  Initial evaluation for acute epigastric abdominal pain with nausea and vomiting. EXAM: CT ABDOMEN AND PELVIS WITH CONTRAST TECHNIQUE: Multidetector CT imaging of the abdomen and pelvis was performed using the standard protocol following bolus administration of intravenous contrast. CONTRAST:  80mL OMNIPAQUE IOHEXOL 300 MG/ML  SOLN COMPARISON:  None. FINDINGS: Lower chest: Partially visualized lung bases are clear. Hepatobiliary: Subcentimeter hypodensity noted within the inferior right hepatic lobe, too small the characterize, but could reflect a small cyst. Finding of doubtful significance. Liver otherwise unremarkable. Gallbladder within normal limits. No biliary dilatation. Pancreas: Pancreas within normal limits. Spleen: Spleen within normal limits. Adrenals/Urinary Tract: Adrenal glands are normal. Kidneys equal size with symmetric enhancement. No nephrolithiasis, hydronephrosis, or focal enhancing renal mass. No hydroureter. Partially distended bladder within normal limits. Stomach/Bowel: Stomach within normal limits. No evidence for bowel obstruction. Normal appendix. No acute plan a tori changes seen about the bowels. Vascular/Lymphatic: Normal intravascular enhancement  seen throughout the intra-abdominal aorta. Mesenteric vessels patent proximally. No adenopathy. Reproductive: Uterus within normal limits. Left ovary normal. 2.3 cm right ovarian cyst, indeterminate, but could reflect a small physiologic follicular cyst or possibly hemorrhagic cyst. Other: No free air or fluid. Musculoskeletal: No acute osseous abnormality. No discrete lytic or blastic osseous lesions. IMPRESSION: 1. No CT evidence for acute intra-abdominal or pelvic process. 2. Normal appendix. 3. 2.3 cm right ovarian cyst, indeterminate, but could reflect a small physiologic follicular cyst or possibly hemorrhagic cyst. Finding could be further assessed with dedicated pelvic ultrasound as clinically warranted. Electronically Signed   By: Rise MuBenjamin  McClintock M.D.   On: 10/28/2019 02:41    Pending Labs Unresulted Labs (From admission, onward)    Start     Ordered   11/03/19 0500  Creatinine, serum  (enoxaparin (LOVENOX)    CrCl >/= 30 ml/min)  Weekly,   R    Comments: while on enoxaparin therapy    10/27/19 2322   10/29/19 0500  Lipid 5am  Tomorrow morning,   R  10/28/19 1012   10/28/19 1015  Wet prep, genital  Once,   STAT    Comments: Self swab   Question:  Patient immune status  Answer:  Normal   10/28/19 1015   10/27/19 2322  Basic metabolic panel  (Diabetes Ketoacidosis (DKA))  STAT Now then every 4 hours ,   STAT     10/27/19 2322   10/27/19 2321  HIV Antibody (routine testing w rflx)  (HIV Antibody (Routine testing w reflex) panel)  Once,   STAT     10/27/19 2322   10/27/19 2038  Beta-hydroxybutyric acid  (Diabetes Ketoacidosis (DKA))  Now then every 8 hours,   STAT     10/27/19 2043   10/27/19 1858  Urine culture  ONCE - STAT,   STAT     10/27/19 1900          Vitals/Pain Today's Vitals   10/28/19 0915 10/28/19 0945 10/28/19 0959 10/28/19 1045  BP:    140/84  Pulse:      Resp: Temp:      TempSrc:      SpO2:    98%  PainSc:   Asleep     Isolation  Precautions No active isolations  Medications Medications  insulin regular, human (MYXREDLIN) 100 units/ 100 mL infusion (3.2 Units/hr Intravenous Rate/Dose Change 10/28/19 1006)  0.9 %  sodium chloride infusion ( Intravenous Canceled Entry 10/28/19 0704)  dextrose 5 %-0.45 % sodium chloride infusion ( Intravenous New Bag/Given 10/28/19 0754)  dextrose 50 % solution 0-50 mL (has no administration in time range)  enoxaparin (LOVENOX) injection 40 mg (has no administration in time range)  pantoprazole (PROTONIX) EC tablet 40 mg (40 mg Oral Given 10/28/19 1008)  metoCLOPramide (REGLAN) injection 5 mg (5 mg Intravenous Given 10/28/19 1007)  alum & mag hydroxide-simeth (MAALOX/MYLANTA) 200-200-20 MG/5ML suspension 30 mL (30 mLs Oral Given 10/28/19 1007)  lidocaine (LIDODERM) 5 % 1 patch (1 patch Transdermal Patch Applied 10/28/19 1007)  ceFAZolin (ANCEF) IVPB 1 g/50 mL premix (has no administration in time range)  sodium chloride 0.9 % bolus 1,000 mL (0 mLs Intravenous Stopped 10/27/19 2059)  famotidine (PEPCID) IVPB 20 mg premix (0 mg Intravenous Stopped 10/27/19 1956)  ondansetron (ZOFRAN) injection 4 mg (4 mg Intravenous Given 10/27/19 1921)  cefTRIAXone (ROCEPHIN) 1 g in sodium chloride 0.9 % 100 mL IVPB (0 g Intravenous Stopped 10/27/19 2215)  potassium chloride 10 mEq in 100 mL IVPB ( Intravenous Stopped 10/28/19 0017)  metoCLOPramide (REGLAN) injection 10 mg (10 mg Intravenous Given 10/27/19 2311)  iohexol (OMNIPAQUE) 300 MG/ML solution 80 mL (80 mLs Intravenous Contrast Given 10/28/19 0207)  ondansetron (ZOFRAN) injection 4 mg (4 mg Intravenous Given 10/28/19 0921)    Mobility walks Moderate fall risk   Focused Assessments Cardiac Assessment Handoff:  Cardiac Rhythm: Normal sinus rhythm No results found for: CKTOTAL, CKMB, CKMBINDEX, TROPONINI No results found for: DDIMER Does the Patient currently have chest pain? No      R Recommendations: See Admitting Provider Note  Report given  to:   Additional Notes: DKA

## 2019-10-28 NOTE — ED Notes (Signed)
SDU ordered bfast 

## 2019-10-28 NOTE — Progress Notes (Signed)
CSW called Translation Services at [phone]. They do not provide Arabic translation services. CSW will explore other ways to communicate with the patient and complete the assessment.   CSW will continue to follow and assist as needed.   Domenic Schwab, MSW, [place] Worker [facility]  [phone]

## 2019-10-28 NOTE — ED Notes (Signed)
Pt ambulated to the restroom independently without difficulty.  

## 2019-10-28 NOTE — Progress Notes (Signed)
Spoke with CM/SW [place] r/t some of pt concerns and pt perceived need for support. Informed of translation tablet available for use.

## 2019-10-28 NOTE — Progress Notes (Signed)
Pt with projectile vomiting.  Reports epigastric pain 8/10, previously 2/10.  CBG checked, 143.  Paged [place], NP, care orders given to administer scheduled Reglan.  Will continue to monitor.  Care communicated with pt using [place] and Anip, NT, who speaks pt's language.

## 2019-10-28 NOTE — Progress Notes (Addendum)
Inpatient Diabetes Program Recommendations  AACE/ADA: New Consensus Statement on Inpatient Glycemic Control (2015)  Target Ranges:  Prepandial:   less than 140 mg/dL      Peak postprandial:   less than 180 mg/dL (1-2 hours)      Critically ill patients:  140 - 180 mg/dL   Lab Results  Component Value Date   GLUCAP 231 (H) 10/28/2019   HGBA1C 13.0 (H) 10/28/2019    Review of Glycemic Control Results for KARENE, BRACKEN (MRN [id number]) as of 10/28/2019 10:28  Ref. Range 10/28/2019 07:42 10/28/2019 08:51 10/28/2019 10:05  Glucose-Capillary Latest Ref Range: 70 - 99 mg/dL 161 (H) 180 (H) 231 (H)   Diabetes history: DM 1 Outpatient Diabetes medications: Levemir 40 units AM and 20 units q PM Current orders for Inpatient glycemic control:  IV insulin/DKA order set Inpatient Diabetes Program Recommendations:    Note admit with DKA and A1C=13%.  Based on patients weight/diagnosis, Levemir doses seem high at home.   When patient is ready for transition off insulin drip, consider Levemir 10 units bid.  She will also need Novolog prescribed for home.  Consider "very sensitive" correction starting at 151 mg/dL and Novolog 3 units with each meal.  Likely needs re-education regarding DM, foods, insulin, etc.  Will attempt to see patient on 12/7 or call her using interpreter on 12/6.    Thanks  Adah Perl, RN, BC-ADM Inpatient Diabetes Coordinator Pager [phone] (8a-5p)

## 2019-10-28 NOTE — ED Notes (Signed)
Pt woke up and vomited again, MD paged and ordered zofran.

## 2019-10-28 NOTE — ED Notes (Signed)
Dr Cyndia Skeeters at bedside along with husband.

## 2019-10-28 NOTE — ED Notes (Signed)
Pt now resting

## 2019-10-28 NOTE — Progress Notes (Signed)
Communication sent to MD that Lovenox is pork derivative. Pt does not utilized pork. Another method to be determined.

## 2019-10-28 NOTE — Progress Notes (Signed)
PROGRESS NOTE  Kristin Clay GYI:[id number] DOB: 1986/10/21   PCP: Patient, No Pcp Per  Patient is from: Home  DOA: 10/27/2019 LOS: 0  Brief Narrative / Interim history: 33 year old female with history of DM-1 and recent untreated E. coli UTI presented 10/27/2019 with persistent nausea, vomiting, epigastric pain and vaginal discharge and admitted for DKA, E. coli UTI, intractable nausea and vomiting and vasovagal syncope.  Missed insulin dose this and did not take antibiotic due to nausea and vomiting.  Initial labs consistent with DKA.  Started on insulin drip and IV ceftriaxone.  Subjective: No major events overnight of this morning.  Continues to endorse nausea and emesis.  She had 2 episodes of emesis this morning.  Emesis was nonbloody.  She also reports epigastric pain, dizziness and weakness.  She describes the dizziness as things spinning around her.  She denies headache, vision change, focal weakness, numbness or tingling.  She denies diarrhea.  She states she has some dysuria prior to arrival.  Husband at bedside.  Objective: Vitals:   10/28/19 0800 10/28/19 0900 10/28/19 0915 10/28/19 0945  BP: 136/87 (!) 147/84    Pulse:      Resp: 13 (!) 21 14 15   Temp:      TempSrc:      SpO2:        Intake/Output Summary (Last 24 hours) at 10/28/2019 0955 Last data filed at 10/28/2019 0754 Gross per 24 hour  Intake 2570.5 ml  Output --  Net 2570.5 ml   There were no vitals filed for this visit.  Examination:  GENERAL: No acute distress.  Looks tired. HEENT: MMM.  Vision and hearing grossly intact.  NECK: Supple.  No apparent JVD.  RESP:  No IWOB. Good air movement bilaterally. CVS: Tachycardic to 100s.  Regular rhythm.  Heart sounds normal.  ABD/GI/GU: Bowel sounds present. Soft.  Epigastric and left middle back tenderness MSK/EXT:  No apparent deformity or edema. Moves extremities. SKIN: no apparent skin lesion or wound NEURO: Awake, alert and oriented  appropriately.  Cranial is grossly intact.  No nystagmus.  Finger-to-nose intact.  Motor light sensation intact. PSYCH: Calm. Normal affect.  Looks tired.  Procedures:  None  Assessment & Plan: DKA/poorly controlled DM-1: DKA likely due to missed insulin, dehydration and UTI.  A1c 13.0%.  Still with metabolic acidosis despite anion gap closure.  BHA up from 1.88-2.6. -Continue insulin drip, IV fluids and labs per protocol -Start Reglan for nausea and vomiting -Transition when DKA and GI symptoms resolved. -Check lipid panel -Consult diabetic coordinator  Intractable nausea/vomiting/epigastric pain: could be due to DKA, gastroparesis or GERD.  CT abdomen and pelvis reassuring. -[place] DKA as above -Start IV Reglan, Protonix and as needed GI cocktail  Vasovagal syncope: had a syncopal episode x2 after vomiting prior to arrival.  EKG without abnormal intervals.  No events on telemetry.  -Monitor GI symptoms as above -Monitor electrolytes and replenish aggressively -Continue monitoring on telemetry  Pansensitive E. coli UTI: POA.  Reports dysuria.  Urine culture on 12/3 with pansensitive E. Coli.  Seems to have left CVA tenderness but no acute finding on CT abdomen and pelvis. -Change ceftriaxone to Ancef -Follow repeat culture here. -Lidoderm patch for left lower back pain  Vaginal discharge: CT abdomen pelvis without significant finding.  Per discussion between admitting physician and Gyn low suspicion for PID.  She has no suprapubic or lower abdominal tenderness. -Follow GC/CT -Check wet prep-at high risk for yeast infection due to glucosuria.  COVID-19 test was negative.  Elevated blood pressure without diagnosis of hypertension-likely due to stress and IV fluid. -Continue monitoring  Vertigo: Likely due to DKA.  Neuro exam within normal.  -Continue hydration.                DVT prophylaxis: Subcu Lovenox Code Status: Full code Family Communication: Updated  patient's husband at bedside. Disposition Plan: Remains inpatient due to unresolved DKA and intractable nausea and vomiting Consultants: Diabetic coordinator   Microbiology summarized: 12/4-COVID-19 negative. 12/3-urine culture with pansensitive E. coli  Sch Meds:  Scheduled Meds:  enoxaparin (LOVENOX) injection  40 mg Subcutaneous Daily   lidocaine  1 patch Transdermal Q24H   metoCLOPramide (REGLAN) injection  5 mg Intravenous Q8H   pantoprazole  40 mg Oral Daily   Continuous Infusions:  sodium chloride     cefTRIAXone (ROCEPHIN)  IV     dextrose 5 % and 0.45% NaCl 100 mL/hr at 10/28/19 0754   insulin 1.9 Units/hr (10/28/19 0906)   PRN Meds:.alum & mag hydroxide-simeth, dextrose  Antimicrobials: Anti-infectives (From admission, onward)   Start     Dose/Rate Route Frequency Ordered Stop   10/28/19 2200  cefTRIAXone (ROCEPHIN) 1 g in sodium chloride 0.9 % 100 mL IVPB     1 g 200 mL/hr over 30 Minutes Intravenous Every 24 hours 10/27/19 2323     10/27/19 2045  cefTRIAXone (ROCEPHIN) 1 g in sodium chloride 0.9 % 100 mL IVPB     1 g 200 mL/hr over 30 Minutes Intravenous  Once 10/27/19 2043 10/27/19 2215       I have personally reviewed the following labs and images: CBC: Recent Labs  Lab 10/26/19 1216 10/27/19 1922 10/27/19 1936 10/28/19 0230  WBC 5.4 8.7  --  9.3  NEUTROABS  --  7.6  --   --   HGB 16.0* 15.4* 15.6* 14.5  HCT 48.7* 46.2* 46.0 45.5  MCV 86.3 86.2  --  89.2  PLT 271 287  --  197   BMP &GFR Recent Labs  Lab 10/26/19 1216 10/27/19 1922 10/27/19 1936 10/28/19 0230 10/28/19 0422  NA 136 136 140 137 138  K 3.8 3.7 3.7 3.5 4.7  CL 102 105  --  111 113*  CO2 20* 13*  --  14* 17*  GLUCOSE 386* 289*  --  135* 184*  BUN 12 11  --  8 8  CREATININE 0.55 0.49  --  0.45 0.38*  CALCIUM 9.2 8.4*  --  7.5* 6.9*   Estimated Creatinine Clearance: 67.6 mL/min (A) (by C-G formula based on SCr of 0.38 mg/dL (L)). Liver & Pancreas: Recent Labs  Lab  10/26/19 1216 10/27/19 1922  AST 12* 16  ALT 13 14  ALKPHOS 112 106  BILITOT 0.8 1.0  PROT 7.9 7.7  ALBUMIN 3.8 3.8   Recent Labs  Lab 10/26/19 1216 10/27/19 1922  LIPASE 17 19   No results for input(s): AMMONIA in the last 168 hours. Diabetic: Recent Labs    10/28/19 0230  HGBA1C 13.0*   Recent Labs  Lab 10/28/19 0408 10/28/19 0530 10/28/19 0624 10/28/19 0742 10/28/19 0851  GLUCAP 148* 172* 232* 161* 180*   Cardiac Enzymes: No results for input(s): CKTOTAL, CKMB, CKMBINDEX, TROPONINI in the last 168 hours. No results for input(s): PROBNP in the last 8760 hours. Coagulation Profile: No results for input(s): INR, PROTIME in the last 168 hours. Thyroid Function Tests: No results for input(s): TSH, T4TOTAL, FREET4, T3FREE, THYROIDAB in the last 72  hours. Lipid Profile: No results for input(s): CHOL, HDL, LDLCALC, TRIG, CHOLHDL, LDLDIRECT in the last 72 hours. Anemia Panel: No results for input(s): VITAMINB12, FOLATE, FERRITIN, TIBC, IRON, RETICCTPCT in the last 72 hours. Urine analysis:    Component Value Date/Time   COLORURINE YELLOW 10/27/2019 2142   APPEARANCEUR HAZY (A) 10/27/2019 2142   LABSPEC 1.028 10/27/2019 2142   PHURINE 5.0 10/27/2019 2142   GLUCOSEU >=500 (A) 10/27/2019 2142   HGBUR NEGATIVE 10/27/2019 2142   BILIRUBINUR NEGATIVE 10/27/2019 2142   KETONESUR 80 (A) 10/27/2019 2142   PROTEINUR 100 (A) 10/27/2019 2142   NITRITE NEGATIVE 10/27/2019 2142   LEUKOCYTESUR NEGATIVE 10/27/2019 2142   Sepsis Labs: Invalid input(s): PROCALCITONIN, [place]  Microbiology: Recent Results (from the past 240 hour(s))  Urine culture     Status: Abnormal   Collection Time: 10/26/19  5:12 PM   Specimen: Urine, Random  Result Value Ref Range Status   Specimen Description URINE, RANDOM  Final   Special Requests   Final    Normal Performed at [facility], [address] [place], [place] [address]    Culture 80,000 COLONIES/mL ESCHERICHIA COLI (A)   Final   Report Status 10/28/2019 FINAL  Final   Organism ID, Bacteria ESCHERICHIA COLI (A)  Final      Susceptibility   Escherichia coli - MIC*    AMPICILLIN <=2 SENSITIVE Sensitive     CEFAZOLIN <=4 SENSITIVE Sensitive     CEFTRIAXONE <=1 SENSITIVE Sensitive     CIPROFLOXACIN <=0.25 SENSITIVE Sensitive     GENTAMICIN <=1 SENSITIVE Sensitive     IMIPENEM <=0.25 SENSITIVE Sensitive     NITROFURANTOIN <=16 SENSITIVE Sensitive     TRIMETH/SULFA <=20 SENSITIVE Sensitive     AMPICILLIN/SULBACTAM <=2 SENSITIVE Sensitive     PIP/TAZO <=4 SENSITIVE Sensitive     Extended ESBL NEGATIVE Sensitive     * 80,000 COLONIES/mL ESCHERICHIA COLI  SARS CORONAVIRUS 2 (TAT 6-24 HRS) Nasopharyngeal Nasopharyngeal Swab     Status: None   Collection Time: 10/27/19  9:20 PM   Specimen: Nasopharyngeal Swab  Result Value Ref Range Status   SARS Coronavirus 2 NEGATIVE NEGATIVE Final    Comment: (NOTE) SARS-CoV-2 target nucleic acids are NOT DETECTED. The SARS-CoV-2 RNA is generally detectable in upper and lower respiratory specimens during the acute phase of infection. Negative results do not preclude SARS-CoV-2 infection, do not rule out co-infections with other pathogens, and should not be used as the sole basis for treatment or other patient management decisions. Negative results must be combined with clinical observations, patient history, and epidemiological information. The expected result is Negative. Fact Sheet for Patients: SugarRoll.be Fact Sheet for Healthcare Providers: https://www.woods-mathews.com/ This test is not yet approved or cleared by the [place] FDA and  has been authorized for detection and/or diagnosis of SARS-CoV-2 by FDA under an Emergency Use Authorization (EUA). This EUA will remain  in effect (meaning this test can be used) for the duration of the COVID-19 declaration under Section 56 4(b)(1) of the Act, 21 U.S.C. section  360bbb-3(b)(1), unless the authorization is terminated or revoked sooner. Performed at [facility], [place] [address] [place], [place] [address]     Radiology Studies: Ct Abdomen Pelvis W Contrast  Result Date: 10/28/2019 CLINICAL DATA:  Initial evaluation for acute epigastric abdominal pain with nausea and vomiting. EXAM: CT ABDOMEN AND PELVIS WITH CONTRAST TECHNIQUE: Multidetector CT imaging of the abdomen and pelvis was performed using the standard protocol following bolus administration  of intravenous contrast. CONTRAST:  80mL OMNIPAQUE IOHEXOL 300 MG/ML  SOLN COMPARISON:  None. FINDINGS: Lower chest: Partially visualized lung bases are clear. Hepatobiliary: Subcentimeter hypodensity noted within the inferior right hepatic lobe, too small the characterize, but could reflect a small cyst. Finding of doubtful significance. Liver otherwise unremarkable. Gallbladder within normal limits. No biliary dilatation. Pancreas: Pancreas within normal limits. Spleen: Spleen within normal limits. Adrenals/Urinary Tract: Adrenal glands are normal. Kidneys equal size with symmetric enhancement. No nephrolithiasis, hydronephrosis, or focal enhancing renal mass. No hydroureter. Partially distended bladder within normal limits. Stomach/Bowel: Stomach within normal limits. No evidence for bowel obstruction. Normal appendix. No acute plan a tori changes seen about the bowels. Vascular/Lymphatic: Normal intravascular enhancement seen throughout the intra-abdominal aorta. Mesenteric vessels patent proximally. No adenopathy. Reproductive: Uterus within normal limits. Left ovary normal. 2.3 cm right ovarian cyst, indeterminate, but could reflect a small physiologic follicular cyst or possibly hemorrhagic cyst. Other: No free air or fluid. Musculoskeletal: No acute osseous abnormality. No discrete lytic or blastic osseous lesions. IMPRESSION: 1. No CT evidence for acute intra-abdominal or pelvic process. 2. Normal  appendix. 3. 2.3 cm right ovarian cyst, indeterminate, but could reflect a small physiologic follicular cyst or possibly hemorrhagic cyst. Finding could be further assessed with dedicated pelvic ultrasound as clinically warranted. Electronically Signed   By: Rise MuBenjamin  McClintock M.D.   On: 10/28/2019 02:41    35 minutes with more than 50% spent in reviewing records, counseling patient/family and coordinating care.   Helem Reesor T. Micha Dosanjh [facility]  If 7PM-7AM, please contact night-coverage [email or web address] Password [email or web address] 10/28/2019, 9:55 AM

## 2019-10-28 NOTE — ED Notes (Signed)
Pt had episode of vomiting. MD sent a message for nausea medication .

## 2019-10-28 NOTE — Progress Notes (Signed)
Spoke with patient and husband using translation services tablet. Pt spoke about wanting to go home because she felt better, however pt still noted with some nausea. Provided benefits and risk to pt related to going/staying. Pt mentioned concerns about billing as well.

## 2019-10-29 ENCOUNTER — Telehealth: Payer: Self-pay | Admitting: Emergency Medicine

## 2019-10-29 LAB — CBC
HCT: 41.4 % (ref 36.0–46.0)
Hemoglobin: 14 g/dL (ref 12.0–15.0)
MCH: 28.5 pg (ref 26.0–34.0)
MCHC: 33.8 g/dL (ref 30.0–36.0)
MCV: 84.3 fL (ref 80.0–100.0)
Platelets: 255 10*3/uL (ref 150–400)
RBC: 4.91 MIL/uL (ref 3.87–5.11)
RDW: 12.9 % (ref 11.5–15.5)
WBC: 8.3 10*3/uL (ref 4.0–10.5)
nRBC: 0 % (ref 0.0–0.2)

## 2019-10-29 LAB — BASIC METABOLIC PANEL
Anion gap: 8 (ref 5–15)
BUN: 5 mg/dL — ABNORMAL LOW (ref 6–20)
CO2: 19 mmol/L — ABNORMAL LOW (ref 22–32)
Calcium: 7.6 mg/dL — ABNORMAL LOW (ref 8.9–10.3)
Chloride: 106 mmol/L (ref 98–111)
Creatinine, Ser: 0.42 mg/dL — ABNORMAL LOW (ref 0.44–1.00)
GFR calc Af Amer: >60 mL/min (ref >60)
GFR calc non Af Amer: >60 mL/min (ref >60)
Glucose, Bld: 272 mg/dL — ABNORMAL HIGH (ref 70–99)
Potassium: 3.9 mmol/L (ref 3.5–5.1)
Sodium: 133 mmol/L — ABNORMAL LOW (ref 135–145)

## 2019-10-29 LAB — GLUCOSE, CAPILLARY
Glucose-Capillary: 274 mg/dL — ABNORMAL HIGH (ref 70–99)
Glucose-Capillary: 205 mg/dL — ABNORMAL HIGH (ref 70–99)

## 2019-10-29 LAB — LIPID PANEL
Cholesterol: 179 mg/dL (ref 0–200)
HDL: 35 mg/dL — ABNORMAL LOW (ref >40)
LDL Cholesterol: 129 mg/dL — ABNORMAL HIGH (ref 0–99)
Total CHOL/HDL Ratio: 5.1 RATIO
Triglycerides: 77 mg/dL (ref <150)
VLDL: 15 mg/dL (ref 0–40)

## 2019-10-29 LAB — PHOSPHORUS: Phosphorus: 1.3 mg/dL — ABNORMAL LOW (ref 2.5–4.6)

## 2019-10-29 LAB — MAGNESIUM: Magnesium: 2.1 mg/dL (ref 1.7–2.4)

## 2019-10-29 LAB — HCG, SERUM, QUALITATIVE: Preg, Serum: NEGATIVE

## 2019-10-29 LAB — URINE CULTURE: Culture: NO GROWTH

## 2019-10-29 MED ORDER — K PHOS MONO-SOD PHOS DI & MONO 155-852-130 MG PO TABS
250.0000 mg | ORAL_TABLET | Freq: Once | ORAL | Status: AC
  Administered 2019-10-29: 250 mg via ORAL
  Filled 2019-10-29: qty 1

## 2019-10-29 MED ORDER — ONDANSETRON HCL 4 MG/2ML IJ SOLN
4.0000 mg | Freq: Four times a day (QID) | INTRAMUSCULAR | Status: DC | PRN
  Administered 2019-10-29: 4 mg via INTRAVENOUS
  Filled 2019-10-29: qty 2

## 2019-10-29 NOTE — Discharge Summary (Signed)
Physician Discharge Summary  Kristin Clay WUJ:[id number] DOB: [birth year] DOA: 10/27/2019  PCP: Patient, No Pcp Per  Admit date: 10/27/2019 Discharge date: 10/29/2019  Admitted From: Home Disposition: Home  Recommendations for Outpatient Follow-up:  1. Follow up with PCP in 1-2 weeks 2. Please obtain BMP/CBC in one week 3. Please follow up on the following pending results:  Home Health: None Equipment/Devices: None  Discharge Condition: Stable CODE STATUS: Full code Diet recommendation: Diabetic  Subjective: Seen and examined.  Husband at bedside.  She feels much better.  No nausea vomiting and tolerating diet.  Brief/Interim Summary: 33 year old female with history of DM-1 and recent untreated E. coli UTI presented 10/27/2019 with persistent nausea, vomiting, epigastric pain and vaginal discharge and admitted for DKA, E. coli UTI, intractable nausea and vomiting and vasovagal syncope.  Missed insulin dose and did not take antibiotic due to nausea and vomiting.  Initial labs consistent with DKA.  Started on insulin drip, IV fluids and IV ceftriaxone for UTI.  Subsequently, her DKA resolved, her insulin drip was stopped and she was started on sliding scale insulin and long-acting insulin.  Her Rocephin was switched to IV cefazolin.  Patient seen and examined by myself today for the first time.  Husband at bedside.  She has no complaints.  She has been tolerating regular diet.  No more abdominal pain.  No abdominal tenderness.  CT abdomen was done which was unremarkable.  She did not complain of any vaginal discharge either.  She has received 3 days of IV cephalosporins and her UA showed only 80,000 of E. coli.  Picture not fully impressive.  Since she has received 3 days of IV antibiotics, I do not think further antibiotics are warranted at this point in time.  She is stable and feeling to go home and her husband was at the bedside is also in agreement.  She is going to be discharged in  stable condition.  Of note, she was also started on Reglan here with resumption of gastroparesis however I think her symptoms are likely secondary to DKA which has resolved now.  She will need formal gastric emptying study as outpatient.  Discharge Diagnoses:  Principal Problem:   DKA (diabetic ketoacidoses) (HCC) Active Problems:   Nausea & vomiting   Acute lower UTI    Discharge Instructions  Discharge Instructions    Discharge patient   Complete by: As directed    Discharge disposition: 01-Home or Self Care   Discharge patient date: 10/29/2019     Allergies as of 10/29/2019      Reactions   Pork-derived Products    Pt does not want any pork products      Medication List    TAKE these medications   insulin detemir 100 UNIT/ML injection Commonly known as: LEVEMIR Inject 20-40 Units into the skin See admin instructions. 40 units every morning and 20 units every night   metoCLOPramide 10 MG tablet Commonly known as: REGLAN Take 1 tablet (10 mg total) by mouth every 6 (six) hours.       Allergies  Allergen Reactions  . Pork-Derived Products     Pt does not want any pork products    Consultations: None   Procedures/Studies: Ct Abdomen Pelvis W Contrast  Result Date: 10/28/2019 CLINICAL DATA:  Initial evaluation for acute epigastric abdominal pain with nausea and vomiting. EXAM: CT ABDOMEN AND PELVIS WITH CONTRAST TECHNIQUE: Multidetector CT imaging of the abdomen and pelvis was performed using the standard protocol following  bolus administration of intravenous contrast. CONTRAST:  80mL OMNIPAQUE IOHEXOL 300 MG/ML  SOLN COMPARISON:  None. FINDINGS: Lower chest: Partially visualized lung bases are clear. Hepatobiliary: Subcentimeter hypodensity noted within the inferior right hepatic lobe, too small the characterize, but could reflect a small cyst. Finding of doubtful significance. Liver otherwise unremarkable. Gallbladder within normal limits. No biliary dilatation.  Pancreas: Pancreas within normal limits. Spleen: Spleen within normal limits. Adrenals/Urinary Tract: Adrenal glands are normal. Kidneys equal size with symmetric enhancement. No nephrolithiasis, hydronephrosis, or focal enhancing renal mass. No hydroureter. Partially distended bladder within normal limits. Stomach/Bowel: Stomach within normal limits. No evidence for bowel obstruction. Normal appendix. No acute plan a tori changes seen about the bowels. Vascular/Lymphatic: Normal intravascular enhancement seen throughout the intra-abdominal aorta. Mesenteric vessels patent proximally. No adenopathy. Reproductive: Uterus within normal limits. Left ovary normal. 2.3 cm right ovarian cyst, indeterminate, but could reflect a small physiologic follicular cyst or possibly hemorrhagic cyst. Other: No free air or fluid. Musculoskeletal: No acute osseous abnormality. No discrete lytic or blastic osseous lesions. IMPRESSION: 1. No CT evidence for acute intra-abdominal or pelvic process. 2. Normal appendix. 3. 2.3 cm right ovarian cyst, indeterminate, but could reflect a small physiologic follicular cyst or possibly hemorrhagic cyst. Finding could be further assessed with dedicated pelvic ultrasound as clinically warranted. Electronically Signed   By: Rise Mu M.D.   On: 10/28/2019 02:41      Discharge Exam: Vitals:   10/29/19 0813 10/29/19 1116  BP: (!) 146/95 136/88  Pulse: 86 77  Resp: 17 13  Temp: 99.2 F (37.3 C) 99.5 F (37.5 C)  SpO2: 100% 100%   Vitals:   10/29/19 0604 10/29/19 0634 10/29/19 0813 10/29/19 1116  BP: (!) 148/98 (!) 162/99 (!) 146/95 136/88  Pulse: 85 72 86 77  Resp: Temp:   99.2 F (37.3 C) 99.5 F (37.5 C)  TempSrc:   Oral Oral  SpO2: 100% 100% 100% 100%  Height:        General: Pt is alert, awake, not in acute distress Cardiovascular: RRR, S1/S2 +, no rubs, no gallops Respiratory: CTA bilaterally, no wheezing, no rhonchi Abdominal: Soft, NT, ND,  bowel sounds + Extremities: no edema, no cyanosis    The results of significant diagnostics from this hospitalization (including imaging, microbiology, ancillary and laboratory) are listed below for reference.     Microbiology: Recent Results (from the past 240 hour(s))  Urine culture     Status: Abnormal   Collection Time: 10/26/19  5:12 PM   Specimen: Urine, Random  Result Value Ref Range Status   Specimen Description URINE, RANDOM  Final   Special Requests   Final    Normal Performed at [facility], [address] [place], [place] [address]    Culture 80,000 COLONIES/mL ESCHERICHIA COLI (A)  Final   Report Status 10/28/2019 FINAL  Final   Organism ID, Bacteria ESCHERICHIA COLI (A)  Final      Susceptibility   Escherichia coli - MIC*    AMPICILLIN <=2 SENSITIVE Sensitive     CEFAZOLIN <=4 SENSITIVE Sensitive     CEFTRIAXONE <=1 SENSITIVE Sensitive     CIPROFLOXACIN <=0.25 SENSITIVE Sensitive     GENTAMICIN <=1 SENSITIVE Sensitive     IMIPENEM <=0.25 SENSITIVE Sensitive     NITROFURANTOIN <=16 SENSITIVE Sensitive     TRIMETH/SULFA <=20 SENSITIVE Sensitive     AMPICILLIN/SULBACTAM <=2 SENSITIVE Sensitive     PIP/TAZO <=4 SENSITIVE Sensitive  Extended ESBL NEGATIVE Sensitive     * 80,000 COLONIES/mL ESCHERICHIA COLI  Urine culture     Status: None   Collection Time: 10/27/19  7:00 PM   Specimen: Urine, Random  Result Value Ref Range Status   Specimen Description URINE, RANDOM  Final   Special Requests NONE  Final   Culture   Final    NO GROWTH Performed at [facility], [address], [place], [place] [address]    Report Status 10/29/2019 FINAL  Final  SARS CORONAVIRUS 2 (TAT 6-24 HRS) Nasopharyngeal Nasopharyngeal Swab     Status: None   Collection Time: 10/27/19  9:20 PM   Specimen: Nasopharyngeal Swab  Result Value Ref Range Status   SARS Coronavirus 2 NEGATIVE NEGATIVE Final    Comment: (NOTE) SARS-CoV-2 target nucleic acids are NOT  DETECTED. The SARS-CoV-2 RNA is generally detectable in upper and lower respiratory specimens during the acute phase of infection. Negative results do not preclude SARS-CoV-2 infection, do not rule out co-infections with other pathogens, and should not be used as the sole basis for treatment or other patient management decisions. Negative results must be combined with clinical observations, patient history, and epidemiological information. The expected result is Negative. Fact Sheet for Patients: HairSlick.no Fact Sheet for Healthcare Providers: quierodirigir.com This test is not yet approved or cleared by the [place] FDA and  has been authorized for detection and/or diagnosis of SARS-CoV-2 by FDA under an Emergency Use Authorization (EUA). This EUA will remain  in effect (meaning this test can be used) for the duration of the COVID-19 declaration under Section 56 4(b)(1) of the Act, 21 U.S.C. section 360bbb-3(b)(1), unless the authorization is terminated or revoked sooner. Performed at [facility], [address], [place], [place] [address]   MRSA PCR Screening     Status: None   Collection Time: 10/28/19 12:07 PM   Specimen: Nasopharyngeal  Result Value Ref Range Status   MRSA by PCR NEGATIVE NEGATIVE Final    Comment:        The GeneXpert MRSA Assay (FDA approved for NASAL specimens only), is one component of a comprehensive MRSA colonization surveillance program. It is not intended to diagnose MRSA infection nor to guide or monitor treatment for MRSA infections. Performed at [facility], [address], [place], [place] [address]      Labs: BNP (last 3 results) No results for input(s): BNP in the last 8760 hours. Basic Metabolic Panel: Recent Labs  Lab 10/28/19 0422 10/28/19 0859 10/28/19 1158 10/28/19 1804 10/29/19 0243  NA 138 137 136 133* 133*  K 4.7 3.7 3.1* 3.4* 3.9  CL 113* 110  106 104 106  CO2 17* 18* 18* 19* 19*  GLUCOSE 184* 189* 199* 193* 272*  BUN 8 7 <5* <5* 5*  CREATININE 0.38* 0.45 0.39* 0.36* 0.42*  CALCIUM 6.9* 7.5* 8.0* 7.5* 7.6*  MG  --   --  2.0  --  2.1  PHOS  --   --  1.3*  --  1.3*   Liver Function Tests: Recent Labs  Lab 10/26/19 1216 10/27/19 1922  AST 12* 16  ALT 13 14  ALKPHOS 112 106  BILITOT 0.8 1.0  PROT 7.9 7.7  ALBUMIN 3.8 3.8   Recent Labs  Lab 10/26/19 1216 10/27/19 1922  LIPASE 17 19   No results for input(s): AMMONIA in the last 168 hours. CBC: Recent Labs  Lab 10/26/19 1216 10/27/19 1922 10/27/19 1936 10/28/19 0230  10/29/19 0243  WBC 5.4 8.7  --  9.3 8.3  NEUTROABS  --  7.6  --   --   --   HGB 16.0* 15.4* 15.6* 14.5 14.0  HCT 48.7* 46.2* 46.0 45.5 41.4  MCV 86.3 86.2  --  89.2 84.3  PLT 271 287  --  197 255   Cardiac Enzymes: No results for input(s): CKTOTAL, CKMB, CKMBINDEX, TROPONINI in the last 168 hours. BNP: Invalid input(s): POCBNP CBG: Recent Labs  Lab 10/28/19 1736 10/28/19 1839 10/28/19 2158 10/29/19 0747 10/29/19 1114  GLUCAP 178* 173* 143* 274* 205*   D-Dimer No results for input(s): DDIMER in the last 72 hours. Hgb A1c Recent Labs    10/28/19 0230  HGBA1C 13.0*   Lipid Profile Recent Labs    10/29/19 0243  CHOL 179  HDL 35*  LDLCALC 129*  TRIG 77  CHOLHDL 5.1   Thyroid function studies No results for input(s): TSH, T4TOTAL, T3FREE, THYROIDAB in the last 72 hours.  Invalid input(s): FREET3 Anemia work up No results for input(s): VITAMINB12, FOLATE, FERRITIN, TIBC, IRON, RETICCTPCT in the last 72 hours. Urinalysis    Component Value Date/Time   COLORURINE YELLOW 10/27/2019 2142   APPEARANCEUR HAZY (A) 10/27/2019 2142   LABSPEC 1.028 10/27/2019 2142   PHURINE 5.0 10/27/2019 2142   GLUCOSEU >=500 (A) 10/27/2019 2142   HGBUR NEGATIVE 10/27/2019 2142   BILIRUBINUR NEGATIVE 10/27/2019 2142   KETONESUR 80 (A) 10/27/2019 2142   PROTEINUR 100 (A) 10/27/2019 2142    NITRITE NEGATIVE 10/27/2019 2142   LEUKOCYTESUR NEGATIVE 10/27/2019 2142   Sepsis Labs Invalid input(s): PROCALCITONIN,  WBC,  LACTICIDVEN Microbiology Recent Results (from the past 240 hour(s))  Urine culture     Status: Abnormal   Collection Time: 10/26/19  5:12 PM   Specimen: Urine, Random  Result Value Ref Range Status   Specimen Description URINE, RANDOM  Final   Special Requests   Final    Normal Performed at [facility], [address] [place], [place] [address]    Culture 80,000 COLONIES/mL ESCHERICHIA COLI (A)  Final   Report Status 10/28/2019 FINAL  Final   Organism ID, Bacteria ESCHERICHIA COLI (A)  Final      Susceptibility   Escherichia coli - MIC*    AMPICILLIN <=2 SENSITIVE Sensitive     CEFAZOLIN <=4 SENSITIVE Sensitive     CEFTRIAXONE <=1 SENSITIVE Sensitive     CIPROFLOXACIN <=0.25 SENSITIVE Sensitive     GENTAMICIN <=1 SENSITIVE Sensitive     IMIPENEM <=0.25 SENSITIVE Sensitive     NITROFURANTOIN <=16 SENSITIVE Sensitive     TRIMETH/SULFA <=20 SENSITIVE Sensitive     AMPICILLIN/SULBACTAM <=2 SENSITIVE Sensitive     PIP/TAZO <=4 SENSITIVE Sensitive     Extended ESBL NEGATIVE Sensitive     * 80,000 COLONIES/mL ESCHERICHIA COLI  Urine culture     Status: None   Collection Time: 10/27/19  7:00 PM   Specimen: Urine, Random  Result Value Ref Range Status   Specimen Description URINE, RANDOM  Final   Special Requests NONE  Final   Culture   Final    NO GROWTH Performed at [facility], [address] [place], [place] [id number]    Report Status 10/29/2019 FINAL  Final  SARS CORONAVIRUS 2 (TAT 6-24 HRS) Nasopharyngeal Nasopharyngeal Swab     Status: None   Collection Time: 10/27/19  9:20 PM   Specimen: Nasopharyngeal Swab  Result Value Ref Range Status   SARS  Coronavirus 2 NEGATIVE NEGATIVE Final    Comment: (NOTE) SARS-CoV-2 target nucleic acids are NOT DETECTED. The SARS-CoV-2 RNA is generally detectable in upper and lower respiratory  specimens during the acute phase of infection. Negative results do not preclude SARS-CoV-2 infection, do not rule out co-infections with other pathogens, and should not be used as the sole basis for treatment or other patient management decisions. Negative results must be combined with clinical observations, patient history, and epidemiological information. The expected result is Negative. Fact Sheet for Patients: SugarRoll.be Fact Sheet for Healthcare Providers: https://www.woods-mathews.com/ This test is not yet approved or cleared by the [place] FDA and  has been authorized for detection and/or diagnosis of SARS-CoV-2 by FDA under an Emergency Use Authorization (EUA). This EUA will remain  in effect (meaning this test can be used) for the duration of the COVID-19 declaration under Section 56 4(b)(1) of the Act, 21 U.S.C. section 360bbb-3(b)(1), unless the authorization is terminated or revoked sooner. Performed at [facility], [place] [address] [place], [place] [address]   MRSA PCR Screening     Status: None   Collection Time: 10/28/19 12:07 PM   Specimen: Nasopharyngeal  Result Value Ref Range Status   MRSA by PCR NEGATIVE NEGATIVE Final    Comment:        The GeneXpert MRSA Assay (FDA approved for NASAL specimens only), is one component of a comprehensive MRSA colonization surveillance program. It is not intended to diagnose MRSA infection nor to guide or monitor treatment for MRSA infections. Performed at [facility], [place] [address] [place], [place] [address]      Time coordinating discharge: Over 30 minutes  SIGNED:   Darliss Cheney, MD  [facility] 10/29/2019, 11:44 AM  If 7PM-7AM, please contact night-coverage [email or web address] Password [email or web address]

## 2019-10-29 NOTE — Telephone Encounter (Signed)
Post ED Visit - Positive Culture Follow-up  Culture report reviewed by antimicrobial stewardship pharmacist: [place] Team []  Elenor Quinones, Pharm.D. []  Heide Guile, Pharm.D., BCPS AQ-ID []  Parks Neptune, Pharm.D., BCPS []  Alycia Rossetti, Pharm.D., BCPS []  [place], Pharm.D., BCPS, AAHIVP []  Legrand Como, Pharm.D., BCPS, AAHIVP [x]  Salome Arnt, PharmD, BCPS []  Johnnette Gourd, PharmD, BCPS []  Hughes Better, PharmD, BCPS []  Leeroy Cha, PharmD []  Laqueta Linden, PharmD, BCPS []  Albertina Parr, PharmD  [place] Team []  Leodis Sias, PharmD []  Lindell Spar, PharmD []  Royetta Asal, PharmD []  Graylin Shiver, Rph []  Rema Fendt) Glennon Mac, PharmD []  Arlyn Dunning, PharmD []  Netta Cedars, PharmD []  Dia Sitter, PharmD []  Leone Haven, PharmD []  Gretta Arab, PharmD []  Theodis Shove, PharmD []  Peggyann Juba, PharmD []  Reuel Boom, PharmD   Positive urine culture Treated with Cepahelxin, organism sensitive to the same and no further patient follow-up is required at this time.  Sandi Raveling Wilkin Lippy 10/29/2019, 1:41 PM

## 2019-10-29 NOTE — Progress Notes (Signed)
Inpatient Diabetes Program Recommendations  AACE/ADA: New Consensus Statement on Inpatient Glycemic Control (2015)  Target Ranges:  Prepandial:   less than 140 mg/dL      Peak postprandial:   less than 180 mg/dL (1-2 hours)      Critically ill patients:  140 - 180 mg/dL   Lab Results  Component Value Date   GLUCAP 205 (H) 10/29/2019   HGBA1C 13.0 (H) 10/28/2019    Review of Glycemic Control Results for Kristin Clay, Kristin Clay (MRN [id number]) as of 10/29/2019 16:15  Ref. Range 10/28/2019 17:36 10/28/2019 18:39 10/28/2019 21:58 10/29/2019 07:47 10/29/2019 11:14  Glucose-Capillary Latest Ref Range: 70 - 99 mg/dL 178 (H) 173 (H) 143 (H) 274 (H) 205 (H)   Diabetes history: DM 1 Outpatient Diabetes medications:  Novolin 70/30 60 units in the AM and 20 units in the PM Current orders for Inpatient glycemic control:  Levemir 12 units bid, Novolog 3 units Novolog moderate tid with meals and HS  Inpatient Diabetes Program Recommendations:    Called and spoke to patient using interpreter.  She states that she has only been in [place] 6 months (previously from [place] where she got her insulin).  She has had diabetes for 13 years and states that her blood sugars usually run between 250-400.  WE discussed importance of glycemic control to prevent complications.  Discussed A1C as well and goal A1C.  Patient admits that she sometimes forgets her evening dose of insulin when she is working.  We discussed importance of taking insulin consistently.  She states she has had a low blood sugar in the and usually treats with Honey.  We discussed that she can also use juice or regular soda.  Patient seems to need LOTS of education regarding DM and how to manage.  It seems that she thinks she is doing well as long as her blood sugars are 250 or so.  Tried to explain that this is not good and detrimental to her body/health.  Encouraged follow-up with MD. Also told her that 70/30 can be purchased from [facility] for 25$.   Reminded her that insulin vial is only good for 28 days and then needs a new vial.  She states she has lots of insulin at home that she got from [place].    Thanks,  Adah Perl, RN, BC-ADM Inpatient Diabetes Coordinator Pager [phone] (8a-5p)

## 2019-10-29 NOTE — Progress Notes (Signed)
CSW provided medicaid application and DV resources to the RN to provide to the patient.   CSW will continue to follow and assist with discharge planning.   Domenic Schwab, MSW, [place] Worker [facility]  [phone]

## 2019-10-29 NOTE — Progress Notes (Signed)
MD paged d/t pt wanting an influenza and pneumonia vaccination and patient stating there is a possibility of her being pregnant. Request for pregnancy test.

## 2019-10-29 NOTE — Progress Notes (Signed)
Notified provider that BP is 143/100, HR 77.  Pt resting quietly.  In no acute distress.   Neutra-Phos due at 0600 has not arrived from pharmacy yet.  Spoke to pharmacy tech who states he will check into it.  Adjusted time to 0700 so med will not be overlooked.   Nausea seems intermittent.  Stable at this time.

## 2019-10-29 NOTE — Plan of Care (Signed)
  Problem: Clinical Measurements: Goal: Diagnostic test results will improve Outcome: Progressing   Problem: Activity: Goal: Risk for activity intolerance will decrease Outcome: Progressing   Problem: Nutrition: Goal: Adequate nutrition will be maintained Outcome: Not Progressing

## 2019-10-29 NOTE — Progress Notes (Signed)
CSW met with patient, spouse and RN at bedside. CSW spoke with patient via stratus,  translation services. Patient feels safe at home. She has already started a Insurance underwriter. CSW informed her to follow up with her worker to make sure she doesn't need to submit anymore paperwork. Patient needed a PCP. CSW will arrange PCP appointment for [facility]. Patient can afford her medications at home. Patient is set to discharge today. CSW provided medicaid application and counseling resources per consult note.   CSW signing off. Please re-consult if needed.   Domenic Schwab, MSW, [place] Worker [facility]  [phone]

## 2019-10-29 NOTE — Progress Notes (Signed)
BP 163/99  Provider paged.  Pt also with vomiting that started after this last BP reading.  Provider notified of this also.   Awaiting orders.  Pt does not appear to be in distress.  Communication obtained with Anip, NT who speaks pt's language.

## 2019-10-29 NOTE — Discharge Instructions (Signed)
Diabetic Ketoacidosis °Diabetic ketoacidosis is a serious complication of diabetes. This condition develops when there is not enough insulin in the body. Insulin is an hormone that regulates blood sugar levels in the body. Normally, insulin allows glucose to enter the cells in the body. The cells break down glucose for energy. Without enough insulin, the body cannot break down glucose, so it breaks down fats instead. This leads to high blood glucose levels in the body and the production of acids that are called ketones. Ketones are poisonous at high levels. °If diabetic ketoacidosis is not treated, it can cause severe dehydration and can lead to a coma or death. °What are the causes? °This condition develops when a lack of insulin causes the body to break down fats instead of glucose. This may be triggered by: °· Stress on the body. This stress is brought on by an illness. °· Infection. °· Medicines that raise blood glucose levels. °· Not taking diabetes medicine. °· New onset of type 1 diabetes mellitus. °What are the signs or symptoms? °Symptoms of this condition include: °· Fatigue. °· Weight loss. °· Excessive thirst. °· Light-headedness. °· Fruity or sweet-smelling breath. °· Excessive urination. °· Vision changes. °· Confusion or irritability. °· Nausea. °· Vomiting. °· Rapid breathing. °· Abdominal pain. °· Feeling flushed. °How is this diagnosed? °This condition is diagnosed based on your medical history, a physical exam, and blood tests. You may also have a urine test to check for ketones. °How is this treated? °This condition may be treated with: °· Fluid replacement. This may be done to correct dehydration. °· Insulin injections. These may be given through the skin or through an IV tube. °· Electrolyte replacement. Electrolytes are minerals in your blood. Electrolytes such as potassium and sodium may be given in pill form or through an IV tube. °· Antibiotic medicines. These may be prescribed if your  condition was caused by an infection. °Diabetic ketoacidosis is a serious medical condition. You may need emergency treatment in the hospital to monitor your condition. °Follow these instructions at home: °Eating and drinking °· Drink enough fluids to keep your urine clear or pale yellow. °· If you are not able to eat, drink clear fluids in small amounts as you are able. Clear fluids include water, ice chips, fruit juice with water added (diluted), and low-calorie sports drinks. You may also have sugar-free jello or popsicles. °· If you are able to eat, follow your usual diet and drink sugar-free liquids, such as water. °Medicines °· Take over-the-counter and prescription medicines only as told by your health care provider. °· Continue to take insulin and other diabetes medicines as told by your health care provider. °· If you were prescribed an antibiotic, take it as told by your health care provider. Do not stop taking the antibiotic even if you start to feel better. °General instructions ° °· Check your urine for ketones when you are ill and as told by your health care provider. °? If your blood glucose is 240 mg/dL (13.3 mmol/L) or higher, check your urine ketones every 4-6 hours. °· Check your blood glucose every day, as often as told by your health care provider. °? If your blood glucose is high, drink plenty of fluids. This helps to flush out ketones. °? If your blood glucose is above your target for 2 tests in a row, contact your health care provider. °· Carry a medical alert card or wear medical alert jewelry that says that you have diabetes. °· Rest   and exercise only as told by your health care provider. Do not exercise when your blood glucose is high and you have ketones in your urine. °· If you get sick, call your health care provider and begin treatment quickly. Your body often needs extra insulin to fight an illness. Check your blood glucose every 4-6 hours when you are sick. °· Keep all follow-up  visits as told by your health care provider. This is important. °Contact a health care provider if: °· Your blood glucose level is higher than 240 mg/dL (13.3 mmol/L) for 2 days in a row. °· You have moderate or large ketones in your urine. °· You have a fever. °· You cannot eat or drink without vomiting. °· You have been vomiting for more than 2 hours. °· You continue to have symptoms of diabetic ketoacidosis. °· You develop new symptoms. °Get help right away if: °· Your blood glucose monitor reads “high” even when you are taking insulin. °· You faint. °· You have chest pain. °· You have trouble breathing. °· You have sudden trouble speaking or swallowing. °· You have vomiting or diarrhea that gets worse after 3 hours. °· You are unable to stay awake. °· You have trouble thinking. °· You are severely dehydrated. Symptoms of severe dehydration include: °? Extreme thirst. °? Dry mouth. °? Rapid breathing. °These symptoms may represent a serious problem that is an emergency. Do not wait to see if the symptoms will go away. Get medical help right away. Call your local emergency services (911 in the [place] Do not drive yourself to the hospital. °Summary °· Diabetic ketoacidosis is a serious complication of diabetes. This condition develops when there is not enough insulin in the body. °· This condition is diagnosed based on your medical history, a physical exam, and blood tests. You may also have a urine test to check for ketones. °· Diabetic ketoacidosis is a serious medical condition. You may need emergency treatment in the hospital to monitor your condition. °· Contact your health care provider if your blood glucose is higher than 240 mg/dl for 2 days in a row or if you have moderate or large ketones in your urine. °This information is not intended to replace advice given to you by your health care provider. Make sure you discuss any questions you have with your health care provider. °Document Released: 11/06/2000  Document Revised: 12/25/2016 Document Reviewed: 12/14/2016 °Elsevier Patient Education © 2020 [employer] ° °

## 2019-10-30 LAB — GC/CHLAMYDIA PROBE AMP (~~LOC~~) NOT AT ARMC
Chlamydia: NEGATIVE
Neisseria Gonorrhea: NEGATIVE

## 2020-01-10 ENCOUNTER — Inpatient Hospital Stay (HOSPITAL_COMMUNITY): Payer: 59

## 2020-01-10 ENCOUNTER — Other Ambulatory Visit: Payer: Self-pay

## 2020-01-10 ENCOUNTER — Inpatient Hospital Stay (HOSPITAL_COMMUNITY)
Admission: AD | Admit: 2020-01-10 | Discharge: 2020-01-10 | Disposition: A | Discharge status: Home / Self Care | Payer: 59 | Attending: Obstetrics and Gynecology | Admitting: Obstetrics and Gynecology

## 2020-01-10 ENCOUNTER — Encounter (HOSPITAL_COMMUNITY): Payer: Self-pay | Admitting: Obstetrics and Gynecology

## 2020-01-10 ENCOUNTER — Other Ambulatory Visit (INDEPENDENT_AMBULATORY_CARE_PROVIDER_SITE_OTHER): Payer: 59 | Admitting: Obstetrics and Gynecology

## 2020-01-10 DIAGNOSIS — Z3A01 Less than 8 weeks gestation of pregnancy: Secondary | ICD-10-CM | POA: Insufficient documentation

## 2020-01-10 DIAGNOSIS — O418X10 Other specified disorders of amniotic fluid and membranes, first trimester, not applicable or unspecified: Secondary | ICD-10-CM

## 2020-01-10 DIAGNOSIS — O468X1 Other antepartum hemorrhage, first trimester: Secondary | ICD-10-CM

## 2020-01-10 DIAGNOSIS — O24011 Pre-existing diabetes mellitus, type 1, in pregnancy, first trimester: Secondary | ICD-10-CM | POA: Diagnosis not present

## 2020-01-10 DIAGNOSIS — E109 Type 1 diabetes mellitus without complications: Secondary | ICD-10-CM | POA: Insufficient documentation

## 2020-01-10 DIAGNOSIS — Z789 Other specified health status: Secondary | ICD-10-CM | POA: Diagnosis present

## 2020-01-10 DIAGNOSIS — Z79899 Other long term (current) drug therapy: Secondary | ICD-10-CM | POA: Diagnosis not present

## 2020-01-10 DIAGNOSIS — Z3490 Encounter for supervision of normal pregnancy, unspecified, unspecified trimester: Secondary | ICD-10-CM

## 2020-01-10 DIAGNOSIS — Z758 Other problems related to medical facilities and other health care: Secondary | ICD-10-CM | POA: Diagnosis present

## 2020-01-10 DIAGNOSIS — O209 Hemorrhage in early pregnancy, unspecified: Secondary | ICD-10-CM | POA: Diagnosis present

## 2020-01-10 DIAGNOSIS — Z794 Long term (current) use of insulin: Secondary | ICD-10-CM | POA: Diagnosis not present

## 2020-01-10 DIAGNOSIS — O208 Other hemorrhage in early pregnancy: Secondary | ICD-10-CM | POA: Diagnosis present

## 2020-01-10 DIAGNOSIS — O418X90 Other specified disorders of amniotic fluid and membranes, unspecified trimester, not applicable or unspecified: Secondary | ICD-10-CM | POA: Diagnosis present

## 2020-01-10 DIAGNOSIS — O24019 Pre-existing diabetes mellitus, type 1, in pregnancy, unspecified trimester: Secondary | ICD-10-CM | POA: Diagnosis present

## 2020-01-10 DIAGNOSIS — N90810 Female genital mutilation status, unspecified: Secondary | ICD-10-CM | POA: Diagnosis present

## 2020-01-10 DIAGNOSIS — O468X9 Other antepartum hemorrhage, unspecified trimester: Secondary | ICD-10-CM | POA: Diagnosis present

## 2020-01-10 LAB — ABO/RH: ABO/RH(D): A NEG

## 2020-01-10 LAB — WET PREP, GENITAL
Clue Cells Wet Prep HPF POC: NONE SEEN
Sperm: NONE SEEN
Trich, Wet Prep: NONE SEEN
Yeast Wet Prep HPF POC: NONE SEEN

## 2020-01-10 LAB — HCG, QUANTITATIVE, PREGNANCY: hCG, Beta Chain, Quant, S: 5356 m[IU]/mL — ABNORMAL HIGH (ref <5)

## 2020-01-10 LAB — CBC
HCT: 42.4 % (ref 36.0–46.0)
Hemoglobin: 14.2 g/dL (ref 12.0–15.0)
MCH: 28.5 pg (ref 26.0–34.0)
MCHC: 33.5 g/dL (ref 30.0–36.0)
MCV: 85.1 fL (ref 80.0–100.0)
Platelets: 229 10*3/uL (ref 150–400)
RBC: 4.98 MIL/uL (ref 3.87–5.11)
RDW: 12.5 % (ref 11.5–15.5)
WBC: 4.3 10*3/uL (ref 4.0–10.5)
nRBC: 0 % (ref 0.0–0.2)

## 2020-01-10 LAB — URINALYSIS, ROUTINE W REFLEX MICROSCOPIC
Bacteria, UA: NONE SEEN
Bilirubin Urine: NEGATIVE
Glucose, UA: >=500 mg/dL — AB
Ketones, ur: NEGATIVE mg/dL
Nitrite: NEGATIVE
Protein, ur: NEGATIVE mg/dL
Specific Gravity, Urine: 1.032 — ABNORMAL HIGH (ref 1.005–1.030)
pH: 5 (ref 5.0–8.0)

## 2020-01-10 LAB — HEMOGLOBIN A1C
Hgb A1c MFr Bld: 10.7 % — ABNORMAL HIGH (ref 4.8–5.6)
Mean Plasma Glucose: 260.39 mg/dL

## 2020-01-10 LAB — POCT PREGNANCY, URINE: Preg Test, Ur: POSITIVE — AB

## 2020-01-10 LAB — HIV ANTIBODY (ROUTINE TESTING W REFLEX): HIV Screen 4th Generation wRfx: NONREACTIVE

## 2020-01-10 LAB — GLUCOSE, CAPILLARY: Glucose-Capillary: 292 mg/dL — ABNORMAL HIGH (ref 70–99)

## 2020-01-10 IMAGING — US US OB < 14 WEEKS - US OB TV
1 series · 15 of 28 positions shown · non-contrast
Comparison: None.

CLINICAL DATA: Positive pregnancy test with vaginal bleeding for 2
days.

EXAM:
OBSTETRIC <14 WK US AND TRANSVAGINAL OB US
TECHNIQUE: Both transabdominal and transvaginal ultrasound examinations were
performed for complete evaluation of the gestation as well as the
maternal uterus, adnexal regions, and pelvic cul-de-sac.
Transvaginal technique was performed to assess early pregnancy.

[Series 1: us ob < 14 weeks - us ob tv · 15 of 106 slices shown]
[im 1/106]
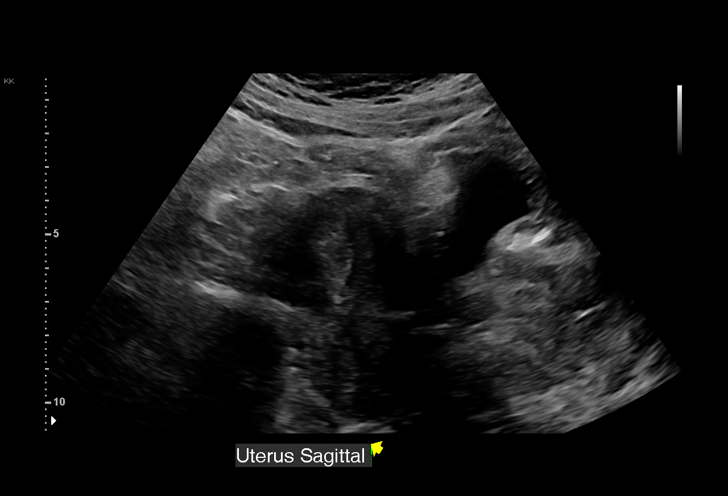
[im 8/106]
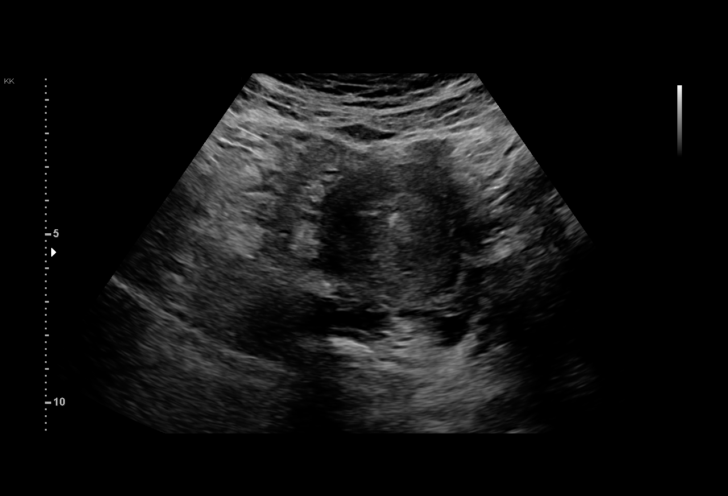
[im 16/106]
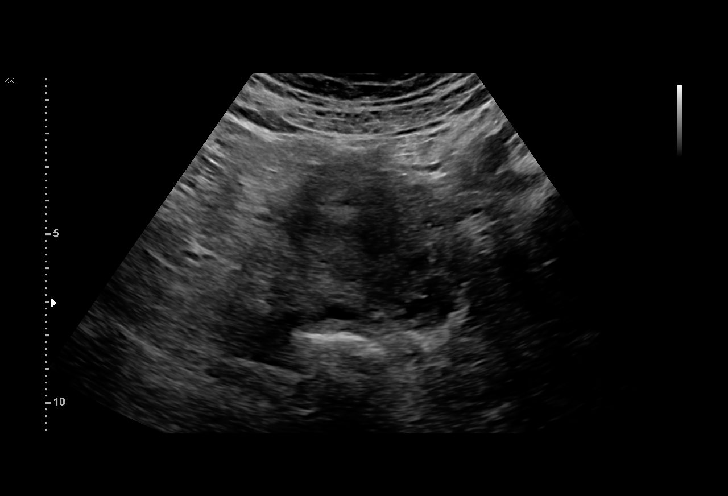
[im 24/106]
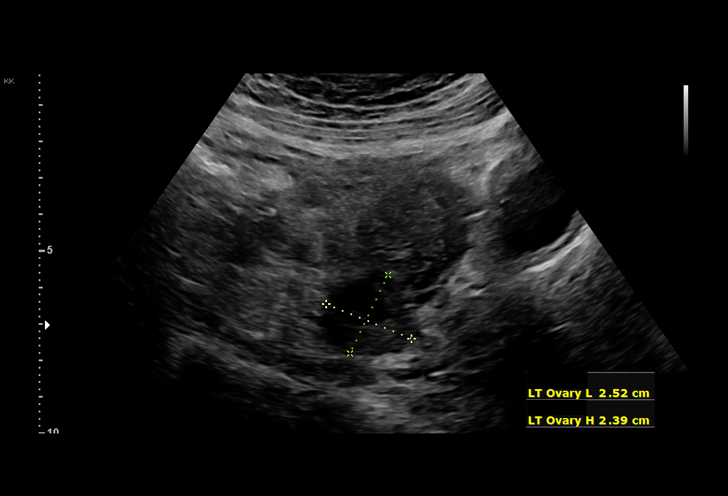
[im 32/106]
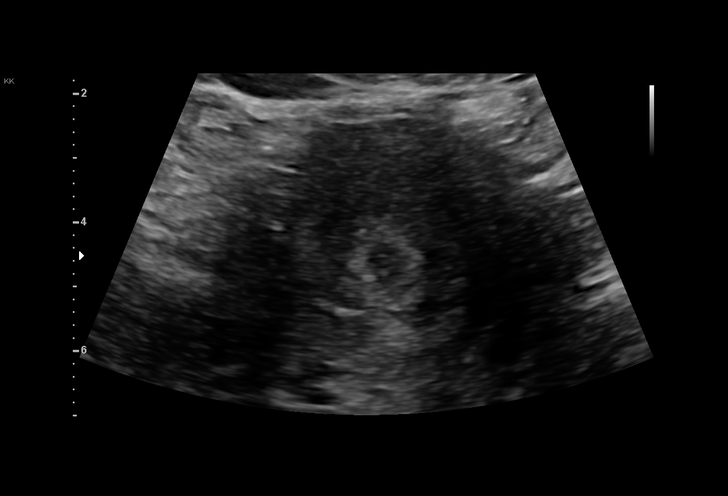
[im 39/106]
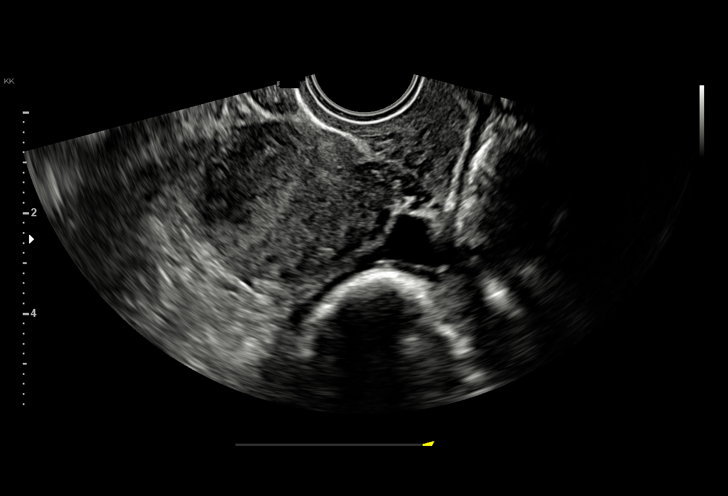
[im 47/106]
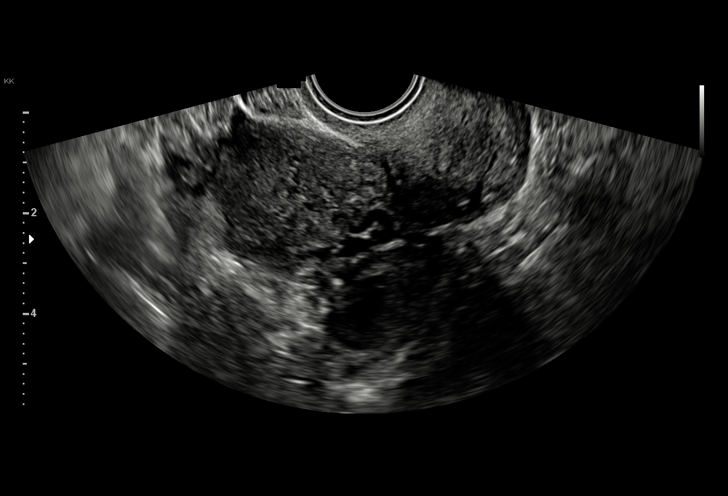
[im 55/106]
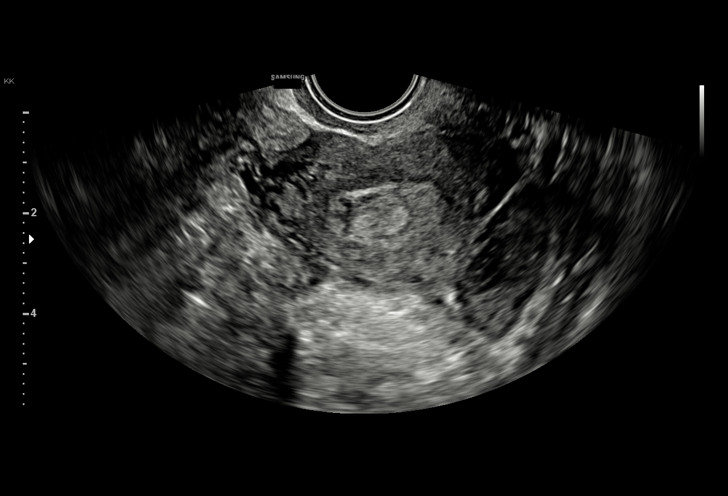
[im 59/106]
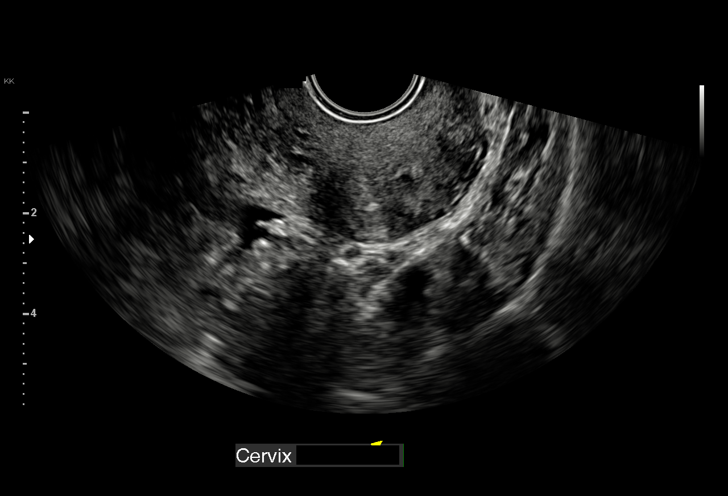
[im 67/106]
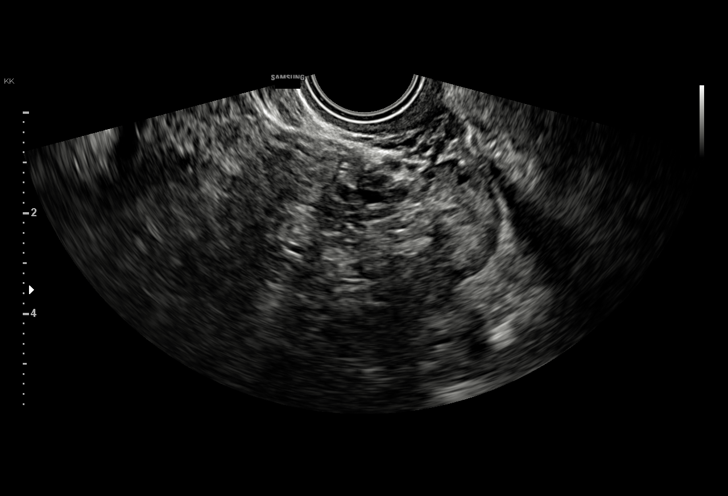
[im 74/106]
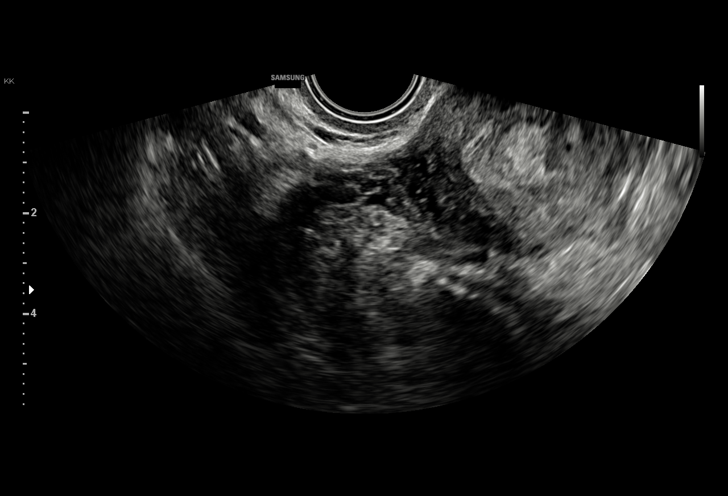
[im 82/106]
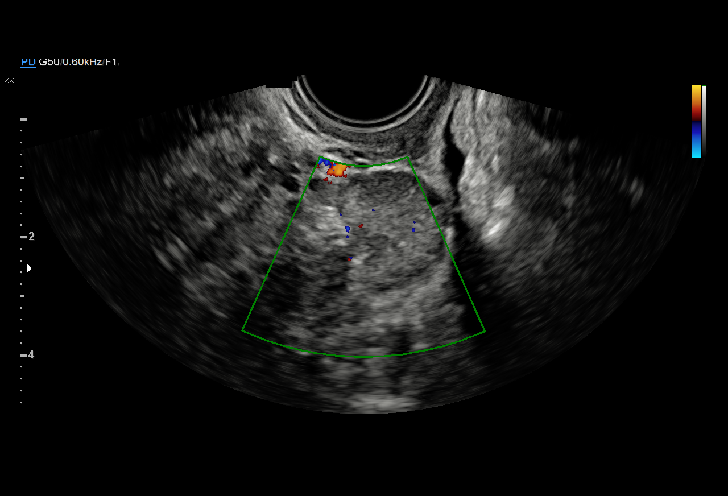
[im 90/106]
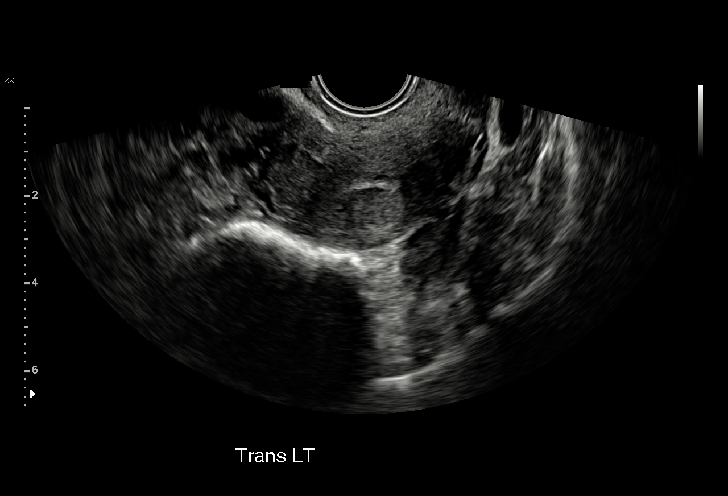
[im 98/106]
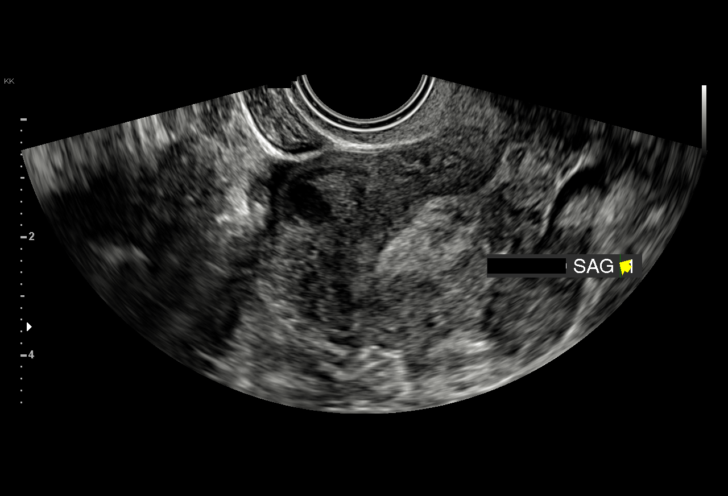
[im 106/106]
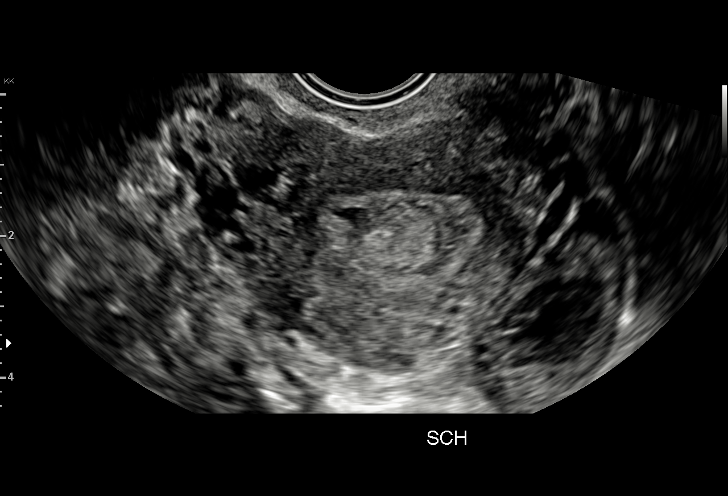

[15 of 28 positions shown; findings below may reference images not displayed]

FINDINGS: Intrauterine gestational sac: Present

Yolk sac:  Present

Embryo:  None

Cardiac Activity: N/A

Heart Rate: N/A bpm

MSD: 5.1 mm   5 w   2 d

Subchorionic hemorrhage:  Small to moderate

Maternal uterus/adnexae: Normal ovaries. Corpus luteum cyst noted on
the left.
IMPRESSION: Intrauterine gestational sac estimated at 5 weeks and 2 days
gestation. There is a yolk sac but no embryonic pole. Recommend
correlation with serial quantitative beta HCG levels and follow-up
ultrasound examination in 10 days to 2 weeks.

Small to moderate subchorionic hemorrhage is noted.

Normal ovaries.

## 2020-01-10 MED ORDER — RHO D IMMUNE GLOBULIN 1500 UNIT/2ML IJ SOSY
300.0000 ug | PREFILLED_SYRINGE | Freq: Once | INTRAMUSCULAR | Status: AC
  Administered 2020-01-10: 15:00:00 300 ug via INTRAMUSCULAR
  Filled 2020-01-10: qty 2

## 2020-01-10 NOTE — MAU Provider Note (Signed)
History     CSN: [id number]  Arrival date and time: 01/10/20 1048   First Provider Initiated Contact with Patient 01/10/20 1214      Chief Complaint  Patient presents with  . Vaginal Bleeding   HPI  Ms.  Kristin Clay is a 34 y.o. year old G1P0 female at [redacted]w[redacted]d weeks gestation by certain LMP who presents to MAU reporting (+) HPT and "worsening" vaginal bleeding. She reports that the vaginal bleeding started yesterday 01/09/2020, but it is worse today. She reports the bleeding is only with wiping after using the bathroom. She denies any pain. She is Type 1 Diabetic on insulin. She was hospitalized for DKA in December 2020. She also reports having surgery on her toes and hands, because of her diabetes.   Past Medical History:  Diagnosis Date  . Type I diabetes mellitus (HCC)     Past Surgical History:  Procedure Laterality Date  . FOOT SURGERY  2018  . HAND SURGERY  2019    Family History  Family history unknown: Yes    Social History   Tobacco Use  . Smoking status: Never Smoker  . Smokeless tobacco: Never Used  Substance Use Topics  . Alcohol use: Never  . Drug use: Never    Allergies:  Allergies  Allergen Reactions  . Pork-Derived Products     Pt does not want any pork products    Medications Prior to Admission  Medication Sig Dispense Refill Last Dose  . insulin detemir (LEVEMIR) 100 UNIT/ML injection Inject 20-40 Units into the skin See admin instructions. 40 units every morning and 20 units every night   01/09/2020 at Unknown time  . metoCLOPramide (REGLAN) 10 MG tablet Take 1 tablet (10 mg total) by mouth every 6 (six) hours. 20 tablet 0 Unknown at Unknown time    Review of Systems  Constitutional: Negative.   HENT: Negative.   Eyes: Negative.   Respiratory: Negative.   Cardiovascular: Negative.   Gastrointestinal: Negative.   Endocrine: Negative.   Genitourinary: Positive for vaginal bleeding.  Musculoskeletal: Negative.   Skin:  Negative.   Allergic/Immunologic: Negative.   Neurological: Negative.   Hematological: Negative.   Psychiatric/Behavioral: Negative.    Physical Exam   Blood pressure 136/74, pulse 81, temperature 98 F (36.7 C), temperature source Oral, resp. rate 20, weight 50.4 kg, last menstrual period 11/16/2019, SpO2 100 %.  Physical Exam  Nursing note and vitals reviewed. Constitutional: She is oriented to person, place, and time. She appears well-developed and well-nourished.  HENT:  Head: Normocephalic and atraumatic.  Eyes: Pupils are equal, round, and reactive to light.  Cardiovascular: Normal rate.  Respiratory: Effort normal.  GI: Soft.  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 Small Pederson with moderate pain level from patient, but verbal consent given from patient to continue with exam; SE: cervix is smooth, pink, no lesions, small amt of thick, red blood -- WP, GC/CT done, closed/long/firm, no CMT or friability, no adnexal tenderness    Musculoskeletal:        General: Normal range of motion.     Cervical back: Normal range of motion.  Neurological: She is alert and oriented to person, place, and time.  Skin: Skin is warm and dry.  Psychiatric: She has a normal mood and affect. Her behavior is normal. Judgment and thought content normal.    MAU Course  Procedures  MDM CCUA UPT CBC ABO/Rh HCG Rhophylac W/U Rhophylac 300 mcg IM injection  Wet Prep GC/CT -- pending HIV -- pending OB < 14 wks [place] with TV  Results for orders placed or performed during the hospital encounter of 01/10/20 (from the past 48 hour(s))  Pregnancy, urine POC     Status: Abnormal   Collection Time: 01/10/20 11:09 AM  Result Value Ref Range   Preg Test, Ur POSITIVE (A) NEGATIVE    Comment:        THE SENSITIVITY OF THIS METHODOLOGY IS >24  mIU/mL   Urinalysis, Routine w reflex microscopic     Status: Abnormal   Collection Time: 01/10/20 11:11 AM  Result Value Ref Range   Color, Urine YELLOW YELLOW   APPearance HAZY (A) CLEAR   Specific Gravity, Urine 1.032 (H) 1.005 - 1.030   pH 5.0 5.0 - 8.0   Glucose, UA >=500 (A) NEGATIVE mg/dL   Hgb urine dipstick LARGE (A) NEGATIVE   Bilirubin Urine NEGATIVE NEGATIVE   Ketones, ur NEGATIVE NEGATIVE mg/dL   Protein, ur NEGATIVE NEGATIVE mg/dL   Nitrite NEGATIVE NEGATIVE   Leukocytes,Ua MODERATE (A) NEGATIVE   RBC / HPF 0-5 0 - 5 RBC/hpf   WBC, UA 0-5 0 - 5 WBC/hpf   Bacteria, UA NONE SEEN NONE SEEN   Squamous Epithelial / LPF 0-5 0 - 5   Mucus PRESENT     Comment: Performed at [facility], [address], [place], [place] [address]  Glucose, capillary     Status: Abnormal   Collection Time: 01/10/20 12:20 PM  Result Value Ref Range   Glucose-Capillary 292 (H) 70 - 99 mg/dL  GC/Chlamydia probe amp ([place])not at [facility]     Status: None   Collection Time: 01/10/20 12:21 PM  Result Value Ref Range   Neisseria Gonorrhea Negative    Chlamydia Negative    Comment Normal Reference Ranger Chlamydia - Negative    Comment      Normal Reference Range Neisseria Gonorrhea - Negative  HIV Antibody (routine testing w rflx)     Status: None   Collection Time: 01/10/20 12:25 PM  Result Value Ref Range   HIV Screen 4th Generation wRfx NON REACTIVE NON REACTIVE    Comment: Performed at [facility], [address], [place], [place] [address]  CBC     Status: None   Collection Time: 01/10/20 12:25 PM  Result Value Ref Range   WBC 4.3 4.0 - 10.5 K/uL   RBC 4.98 3.87 - 5.11 MIL/uL   Hemoglobin 14.2 12.0 - 15.0 g/dL   HCT 97.3 53.2 - 99.2 %   MCV 85.1 80.0 - 100.0 fL   MCH 28.5 26.0 - 34.0 pg   MCHC 33.5 30.0 - 36.0 g/dL   RDW 42.6 83.4 - 19.6 %   Platelets 229 150 - 400 K/uL   nRBC 0.0 0.0 - 0.2 %    Comment: Performed at [facility], [address],  [place], [place] [address]  ABO/Rh     Status: None   Collection Time: 01/10/20 12:25 PM  Result Value Ref Range   ABO/RH(D) A NEG   hCG, quantitative, pregnancy     Status: Abnormal   Collection Time: 01/10/20 12:25 PM  Result Value Ref Range   hCG, Beta Chain, Quant, S 5,356 (H) <5 mIU/mL    Comment:          GEST. AGE  CONC.  (mIU/mL)   <=1 WEEK        5 - 50     2 WEEKS       50 - 500     3 WEEKS       100 - 10,000     4 WEEKS     1,000 - 30,000     5 WEEKS     3,500 - 115,000   6-8 WEEKS     12,000 - 270,000    12 WEEKS     15,000 - 220,000        FEMALE AND NON-PREGNANT FEMALE:     LESS THAN 5 mIU/mL Performed at [facility], [place] [address], [place], [place] [address]   Rh IG workup (includes ABO/Rh)     Status: None   Collection Time: 01/10/20 12:25 PM  Result Value Ref Range   Gestational Age(Wks) 7.6    ABO/RH(D) A NEG    Antibody Screen NEG    Unit Number [id number]    Blood Component Type RHIG    Unit division 00    Status of Unit ISSUED,FINAL    Transfusion Status      OK TO TRANSFUSE Performed at [facility], [place] [address], [place], [place] [address]   Wet prep, genital     Status: Abnormal   Collection Time: 01/10/20 12:41 PM  Result Value Ref Range   Yeast Wet Prep HPF POC NONE SEEN NONE SEEN   Trich, Wet Prep NONE SEEN NONE SEEN   Clue Cells Wet Prep HPF POC NONE SEEN NONE SEEN   WBC, Wet Prep HPF POC MANY (A) NONE SEEN   Sperm NONE SEEN     Comment: Performed at [facility], [place] [address], [place], [place] [address]  Hemoglobin A1c     Status: Abnormal   Collection Time: 01/10/20  2:33 PM  Result Value Ref Range   Hgb A1c MFr Bld 10.7 (H) 4.8 - 5.6 %    Comment: (NOTE) Pre diabetes:          5.7%-6.4% Diabetes:              >6.4% Glycemic control for   <7.0% adults with diabetes    Mean Plasma Glucose 260.39 mg/dL    Comment: Performed at [facility] [address] [place], [place] [address]    US OB LESS THAN  14 WEEKS WITH [place] TRANSVAGINAL  Result Date: 01/10/2020 CLINICAL DATA:  Positive pregnancy test with vaginal bleeding for 2 days. EXAM: OBSTETRIC <14 WK [place] AND TRANSVAGINAL OB US TECHNIQUE: Both transabdominal and transvaginal ultrasound examinations were performed for complete evaluation of the gestation as well as the maternal uterus, adnexal regions, and pelvic cul-de-sac. Transvaginal technique was performed to assess early pregnancy. COMPARISON:  None. FINDINGS: Intrauterine gestational sac: Present Yolk sac:  Present Embryo:  None Cardiac Activity: N/A Heart Rate: N/A bpm MSD: 5.1 mm   5 w   2 d Subchorionic hemorrhage:  Small to moderate Maternal uterus/adnexae: Normal ovaries. Corpus luteum cyst noted on the left. IMPRESSION: Intrauterine gestational sac estimated at 5 weeks and 2 days gestation. There is a yolk sac but no embryonic pole. Recommend correlation with serial quantitative beta HCG levels and follow-up ultrasound examination in 10 days to 2 weeks. Small to moderate subchorionic hemorrhage is noted. Normal ovaries. Electronically Signed   By: Marijo Sanes M.D.   On: 01/10/2020 13:48  Assessment and Plan  Subchorionic hematoma in first trimester, single or unspecified fetus - Information provided on [facility] and threatened miscarriage - Advised of threatened miscarriage being a higher risk - Return to MAU:  If you have heavier bleeding that soaks through more that 2 pads per hour for an hour or more  If you bleed so much that you feel like you might pass out or you do pass out  If you have significant abdominal pain that is not improved with Tylenol   If you develop a fever > 100.5  Vaginal bleeding affecting early pregnancy  - Bleeding precautions discussed as above  Intrauterine pregnancy  - Appts scheduled for DM education/nutritionist, RN Intake and NOB at [facility]  Type 1 diabetes mellitus during pregnancy in first trimester  - Advised to continue to take insulin as  instructed - Discussed increased risk for fetal loss, fetal anomalies, fetal macrosomia, and neonatal death  - Discharge patient   *AMN Language video  interpreter Samir (Unknown ID) used for results, plan of care and discharge instructions  Raelyn Mora, MSN, CNM 01/10/2020, 12:14 PM

## 2020-01-10 NOTE — Discharge Instructions (Signed)

## 2020-01-10 NOTE — MAU Note (Signed)
Pt presents with c/o VB that began yesterday and has worsened today, bleeding only with wiping.  LMP 11/16/2019.  +HPT.

## 2020-01-11 DIAGNOSIS — Z789 Other specified health status: Secondary | ICD-10-CM | POA: Diagnosis present

## 2020-01-11 LAB — RH IG WORKUP (INCLUDES ABO/RH)
ABO/RH(D): A NEG
Antibody Screen: NEGATIVE
Gestational Age(Wks): 7.6
Unit division: 0

## 2020-01-11 LAB — GC/CHLAMYDIA PROBE AMP (~~LOC~~) NOT AT ARMC
Chlamydia: NEGATIVE
Comment: NEGATIVE
Comment: NORMAL
Neisseria Gonorrhea: NEGATIVE

## 2020-01-18 ENCOUNTER — Other Ambulatory Visit: Payer: 59

## 2020-01-23 ENCOUNTER — Ambulatory Visit: Payer: 59 | Admitting: Registered"

## 2020-01-23 ENCOUNTER — Encounter: Payer: 59 | Attending: Obstetrics and Gynecology | Admitting: Registered"

## 2020-01-23 ENCOUNTER — Other Ambulatory Visit: Payer: Self-pay

## 2020-01-23 DIAGNOSIS — O24111 Pre-existing diabetes mellitus, type 2, in pregnancy, first trimester: Secondary | ICD-10-CM | POA: Diagnosis present

## 2020-01-23 NOTE — Progress Notes (Signed)
Patient was seen on 01/23/2020 for Type 1 diabetes in pregnancy education. In-person interpreter, [facility], present for visit.  EDD 08/22/20; New OB visit scheduled 02/08/20  Assessment:   Patient does not have a PCP who manages her diabetes. Patient reports she has lived in the [place] for 1 year. Patient reports diagnosis of Type 1 DM at age 34 and has been getting her insulin prescription from her doctor in [place]. Patient has not had insurance until today [facility]). RD called patient's insurance for insurance coverage on glucose meter. Representative could not give gauranteed info without CPT code but stated for in-network they need pre-authorization then 80% covered for in-network and aver $1500 deductible, 100% covered. Patient has applied for Medicaid. Next appointment will discuss options for CGM or standard glucose meter.  Current HbA1c: 10.7% Patient checks blood sugar infrequently, last week FBS 290 mg/dL  MEDICATIONS:  Patient reports current medication is Novolin 70/30. Levemir in Epic, likely from hospital admission for DKA in December.  Pt states in November she was also taking Janumet 3 mg BID. Janumet does not come in 3 mg dose. RD asked patient to bring medication to next appointment. RD suggested patient have her diabetes care moved locally where she can be assessed for the type of diabetes that she has.  Novolin 70/30 technique/storage assessment: Patient states rotation of sites, mostly in legs, is afraid to inject in stomach. Patient pulls needle out right after injection. Patient reports burning sensation. Patient reports that she keeps her opened and unopened pens in the refrigerator.  DIETARY INTAKE:  24-hr recall:  B (11 AM): small glass of milk, egg sandwich  L (2 PM): salad, beef, bread D (12 AM): fish or chicken, 1 c milk with 3 Tbs sugar, juice Snk (various times) orange Beverages: water, milk with sugar, juice   Usual physical  activity: not assessed  Intervention:  Nutrition counseling provided.  Discussed diabetes disease process and treatment options.   Discussed physiology of diabetes and the different types of diabetes.  Discussed role of medications and diet in glucose control  Reviewed patient medications.  Discussed role of medication on blood glucose  Discussed insulin storage and injection techniques.  Plan reviewed with/approved by Dr. Gigi Gin Constant:  Insulin: for improved technique leave insulin needle in skin for a count of 5-10 after injection. Continue injecting 40 units of Novolin 70/30 with first meal and 20 units with last meal.  Reduce sugar sweetened beverages and juice  Check blood sugar 4x day; Fasting and 2 hrs post prandial  Return next week for follow up visit and bring medications and blood sugar log.  We will review what insurance you have and what glucose meter is covered.  Handouts given during visit include:  none  Barriers to learning/adherence to lifestyle change: language barrier  Monitoring/Evaluation:  Dietary intake, exercise, blood sugar log, medications in 1 week.

## 2020-01-30 ENCOUNTER — Other Ambulatory Visit: Payer: Self-pay | Admitting: Obstetrics & Gynecology

## 2020-01-30 ENCOUNTER — Other Ambulatory Visit: Payer: Self-pay

## 2020-01-30 ENCOUNTER — Encounter: Payer: 59 | Admitting: Registered"

## 2020-01-30 ENCOUNTER — Ambulatory Visit: Payer: 59 | Admitting: Registered"

## 2020-01-30 VITALS — Ht 61.0 in | Wt 120.8 lb

## 2020-01-30 DIAGNOSIS — O24111 Pre-existing diabetes mellitus, type 2, in pregnancy, first trimester: Secondary | ICD-10-CM | POA: Diagnosis not present

## 2020-01-30 DIAGNOSIS — O24011 Pre-existing diabetes mellitus, type 1, in pregnancy, first trimester: Secondary | ICD-10-CM

## 2020-01-30 MED ORDER — INSULIN REGULAR HUMAN 100 UNIT/ML IJ SOLN: INTRAMUSCULAR | 11 refills | Status: DC

## 2020-01-30 MED ORDER — "INSULIN SYRINGE-NEEDLE U-100 30G X 15/64"" 0.5 ML MISC": 1.0000 | Freq: Two times a day (BID) | 5 refills | Status: DC

## 2020-01-30 MED ORDER — INSULIN NPH (HUMAN) (ISOPHANE) 100 UNIT/ML ~~LOC~~ SUSP: SUBCUTANEOUS | 3 refills | Status: DC

## 2020-01-30 MED ORDER — ONETOUCH ULTRASOFT LANCETS MISC: 12 refills | Status: DC

## 2020-01-30 MED ORDER — ONETOUCH ULTRA MINI W/DEVICE KIT: 1.0000 | PACK | Freq: Once | 0 refills | Status: DC

## 2020-01-30 MED ORDER — GLUCOSE BLOOD VI STRP: ORAL_STRIP | 12 refills | Status: DC

## 2020-01-30 NOTE — Progress Notes (Signed)
Patient was seen on 01/30/2020 for Type 1 diabetes in pregnancy education. In-person interpreter, [facility], present for visit.  EDD 08/22/20; New OB visit scheduled 02/08/20  Assessment:   Per Patient Type 1 DM diagnosis at age 34. Patient does not have a PCP who manages her diabetes.   MEDICATIONS: Novolin 70/30 (has insulin from [place])  Patient reports injections before breakfast 40 units and before dinner 20 units. Levemir in medication list in Epic, likely from hospital admission for DKA in December 2020. Patient states not taking  Pt states in November Janumet 3 mg BID was discontinued. Janumet does not come in 3 mg dose. Patient does not have bottle to confirm what she was taking. Pt reported type of medication and dose doesn't make sense because she has Type 1 DM and also Janumet does not come in 3 mg dose. It's possible patient has insulin resistance and would benefit from getting established with a PCP or endocrinologist. RD discussed with Pt first visit.  Patient has applied for Medicaid, currently has [employer]. During first visit RD called patient's insurance, Rep could not give guarantee coverage of glucometer without CPT code but stated for in-network they need pre-authorization then 80% covered for in-network and after $1500 deductible, 100% covered.  Current HbA1c: 10.7%  Patient states she has cut back on sweetened beverages, no longer adding sugar to milk.  Patient is checking her blood sugar 4 times per day; before and 2 hours after breakfast and dinner. Patterns has been consistent during the past week with Fasting and pre-meal 220-250 mg/dL; 2 hrs post prandial: 675-916 mg/dL  Intervention:  Discussed types of insulin and cost for Relion. Patient is agreeable and will pick up prescription today and return for mixing insulin instruction at [facility] tomorrow 01/31/20.  Plan reviewed with/approved by Dr. Macon Large  Insulin:  AM: Relion NPH 30 units/  Relion Regular 15 units PM: Relion NPH 20 units/Relion Regular 10 units   Return tomorrow for insulin mixing instruction  Handouts given during visit include:  none  Barriers to learning/adherence to lifestyle change: language barrier  Monitoring/Evaluation:  Dietary intake, exercise, blood sugar log, medications & insulin instruction in 1 day.

## 2020-01-31 ENCOUNTER — Ambulatory Visit (INDEPENDENT_AMBULATORY_CARE_PROVIDER_SITE_OTHER): Payer: 59 | Admitting: Obstetrics & Gynecology

## 2020-01-31 ENCOUNTER — Other Ambulatory Visit: Payer: Self-pay | Admitting: Obstetrics & Gynecology

## 2020-01-31 ENCOUNTER — Ambulatory Visit (HOSPITAL_COMMUNITY)
Admission: RE | Admit: 2020-01-31 | Discharge: 2020-01-31 | Disposition: A | Discharge status: Home / Self Care | Payer: 59 | Source: Ambulatory Visit | Attending: Obstetrics and Gynecology | Admitting: Obstetrics and Gynecology

## 2020-01-31 ENCOUNTER — Ambulatory Visit: Payer: 59 | Admitting: Registered"

## 2020-01-31 ENCOUNTER — Telehealth: Payer: Self-pay | Admitting: *Deleted

## 2020-01-31 ENCOUNTER — Other Ambulatory Visit: Payer: Self-pay | Admitting: General Practice

## 2020-01-31 DIAGNOSIS — O24011 Pre-existing diabetes mellitus, type 1, in pregnancy, first trimester: Secondary | ICD-10-CM

## 2020-01-31 DIAGNOSIS — O418X10 Other specified disorders of amniotic fluid and membranes, first trimester, not applicable or unspecified: Secondary | ICD-10-CM | POA: Diagnosis present

## 2020-01-31 DIAGNOSIS — O020 Blighted ovum and nonhydatidiform mole: Secondary | ICD-10-CM | POA: Diagnosis not present

## 2020-01-31 DIAGNOSIS — O209 Hemorrhage in early pregnancy, unspecified: Secondary | ICD-10-CM | POA: Diagnosis present

## 2020-01-31 DIAGNOSIS — Z789 Other specified health status: Secondary | ICD-10-CM

## 2020-01-31 DIAGNOSIS — O468X1 Other antepartum hemorrhage, first trimester: Secondary | ICD-10-CM | POA: Diagnosis present

## 2020-01-31 DIAGNOSIS — Z3490 Encounter for supervision of normal pregnancy, unspecified, unspecified trimester: Secondary | ICD-10-CM | POA: Insufficient documentation

## 2020-01-31 DIAGNOSIS — E109 Type 1 diabetes mellitus without complications: Secondary | ICD-10-CM

## 2020-01-31 IMAGING — US US OB TRANSVAGINAL
1 series · 16 of 28 positions shown · non-contrast
Comparison: [DATE]

CLINICAL DATA: Threatened abortion

EXAM:
TRANSVAGINAL OB ULTRASOUND
TECHNIQUE: Transvaginal ultrasound was performed for complete evaluation of the
gestation as well as the maternal uterus, adnexal regions, and
pelvic cul-de-sac.

[Series 1: us ob transvaginal · 30 acquisitions, 16 frames shown]
[im 1/30]
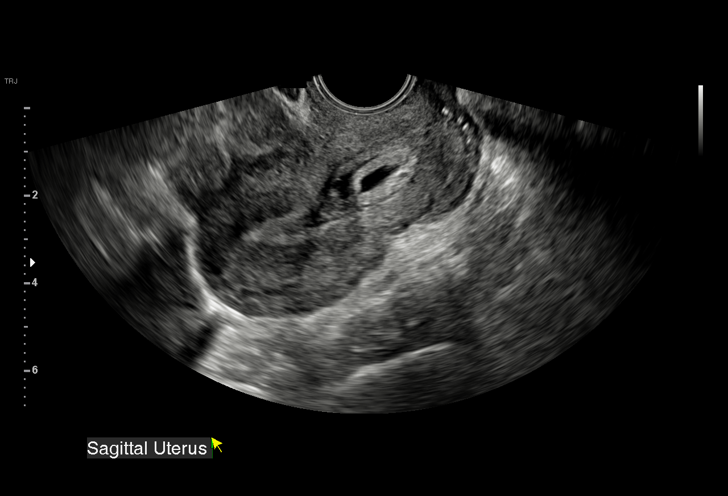
[im 3/30]
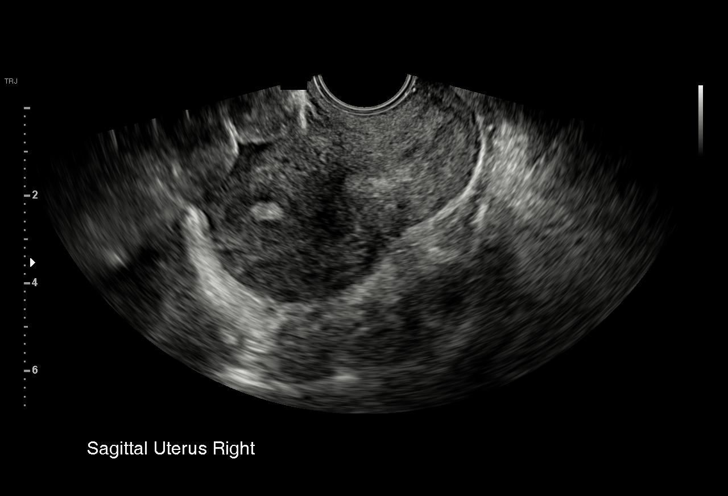
[im 5/30]
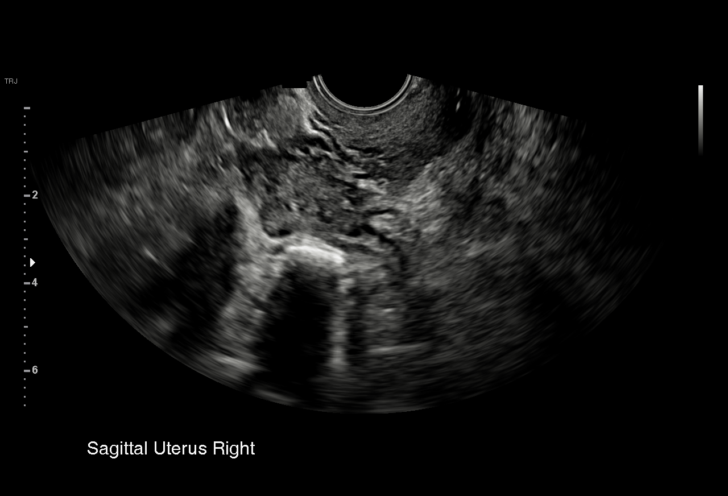
[im 7/30]
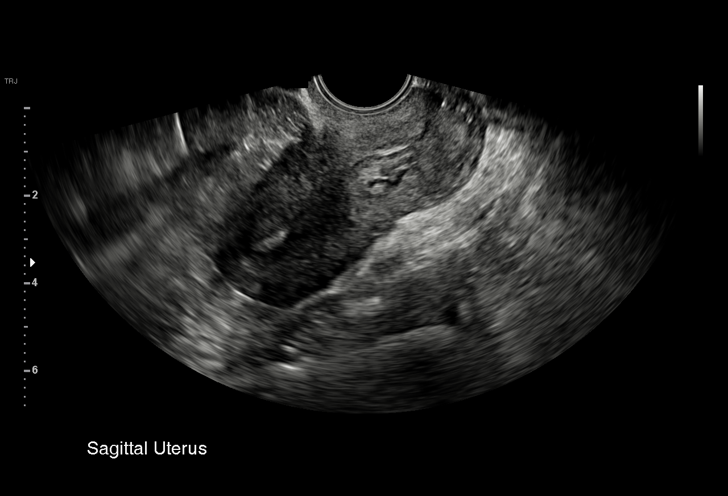
[im 8/30]
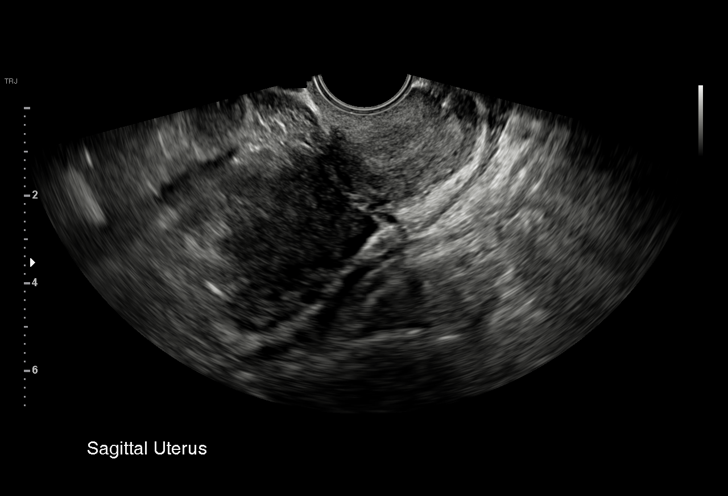
[im 10/30]
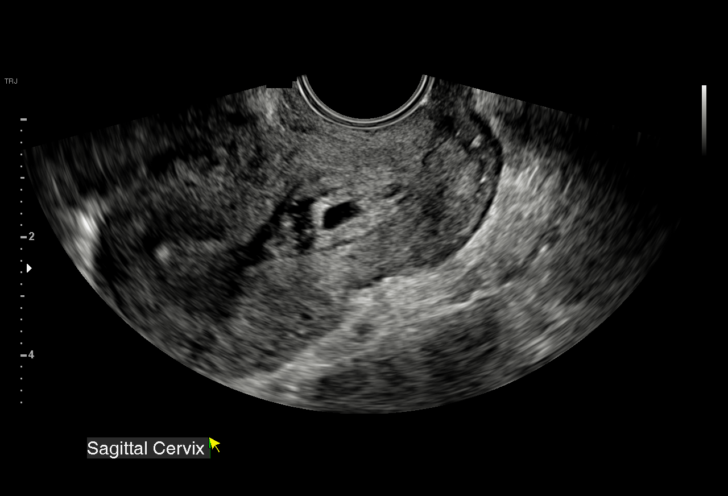
[im 12/30]
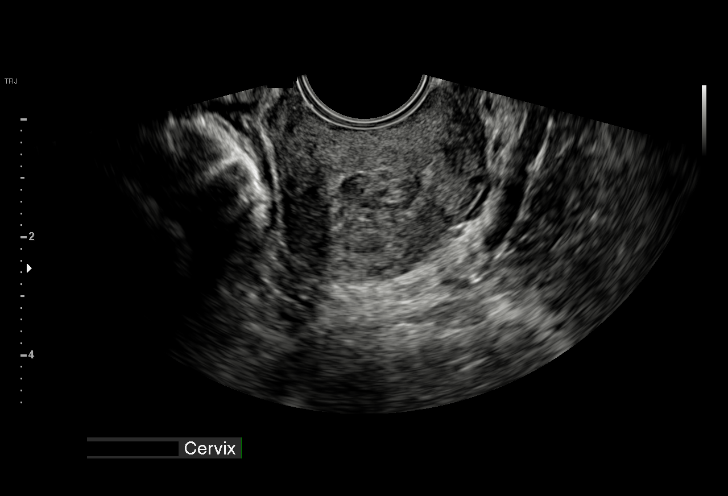
[im 14/30]
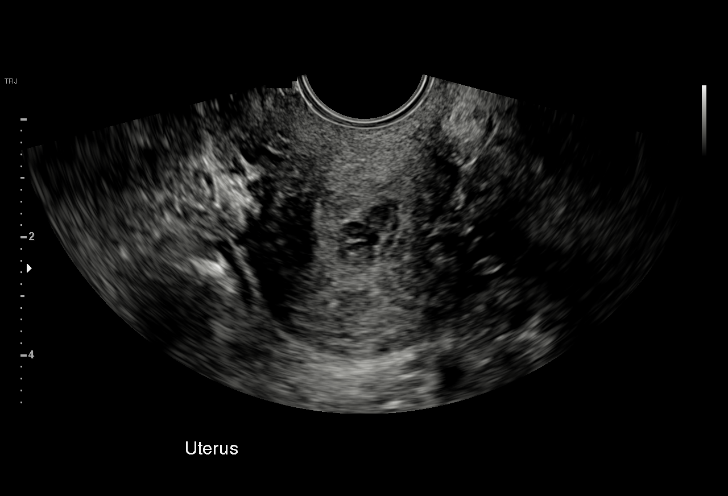
[im 16/30]
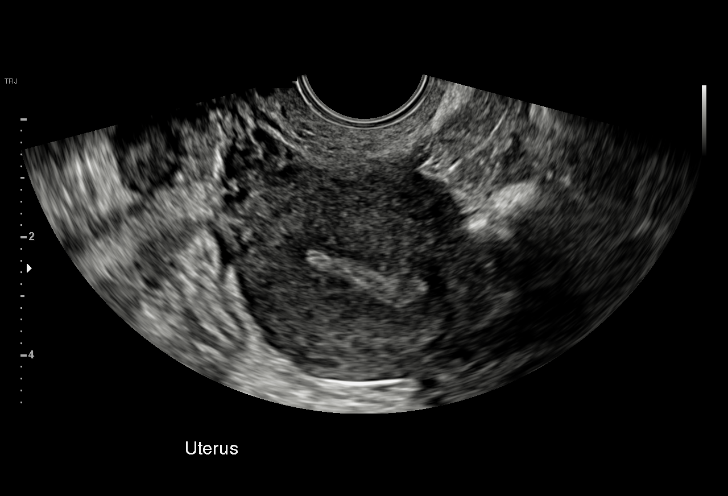
[im 18/30]
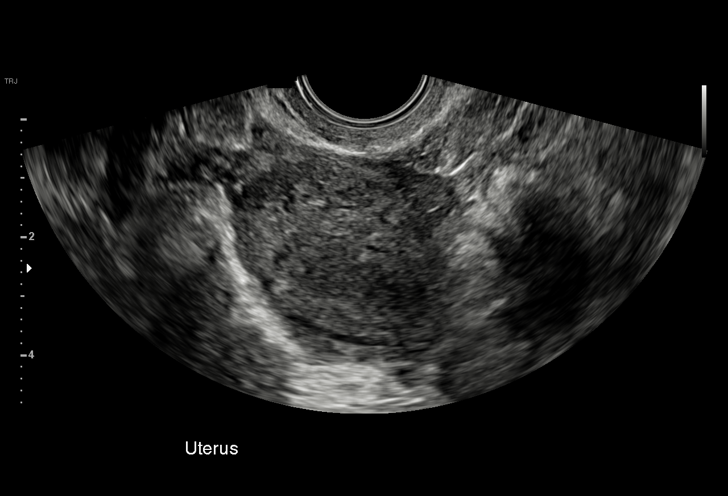
[im 20/30]
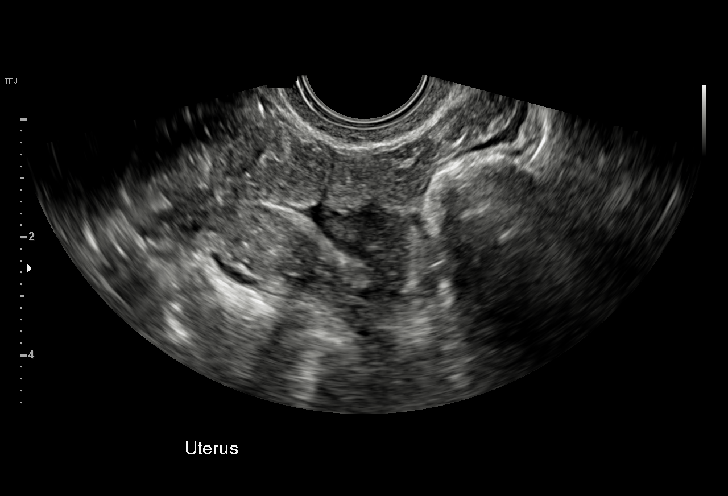
[im 22/30]
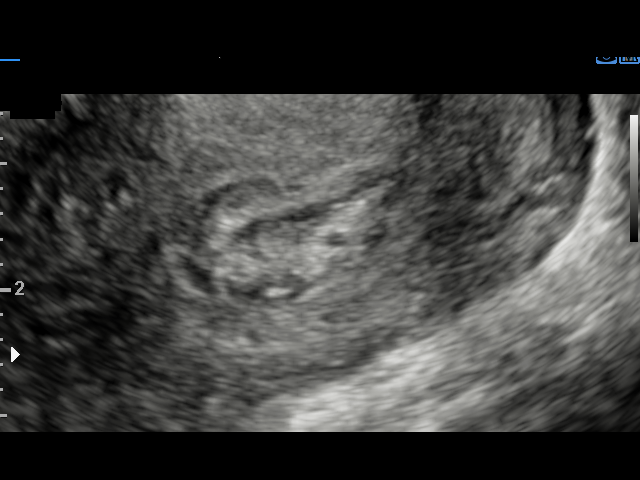
[im 23/30]
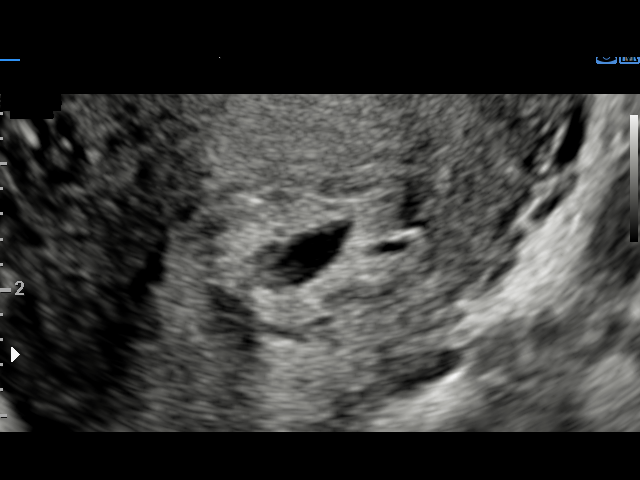
[im 25/30]
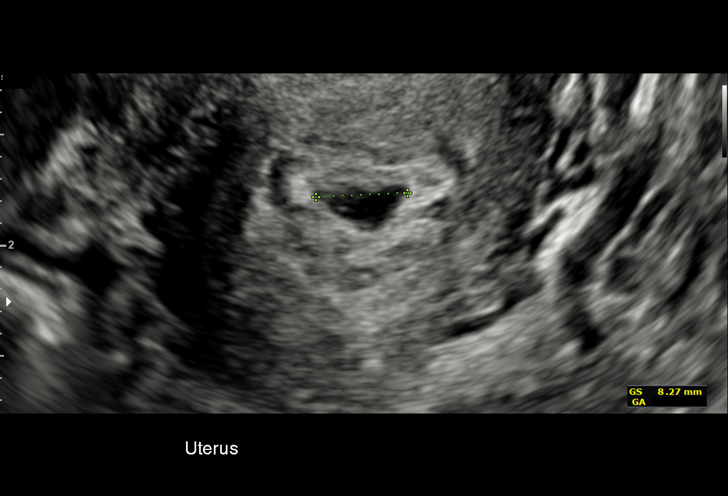
[im 27/30]
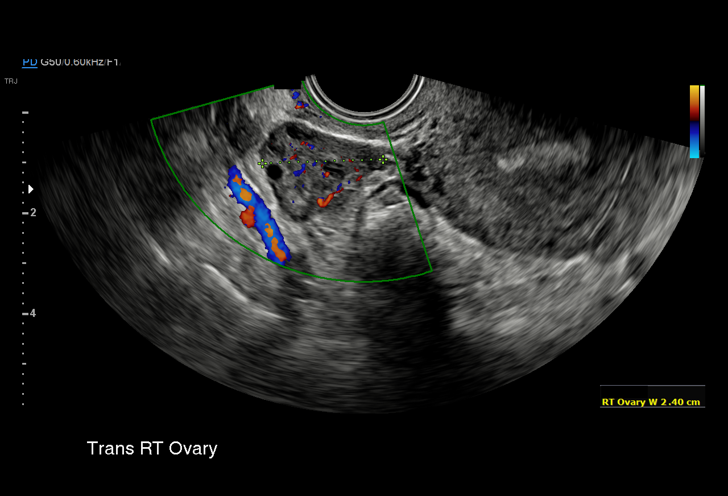
[im 30/30]
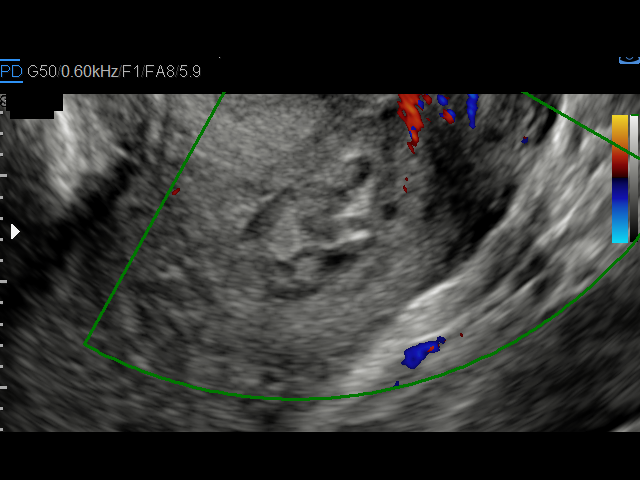

[16 of 28 positions shown; findings below may reference images not displayed]

FINDINGS: Intrauterine gestational sac: Single, in the lower uterine segment

Yolk sac:  Not visualized

Embryo:  Not visualized

Cardiac Activity: Not visualized

Heart Rate:  bpm

MSD:   mm    w     d

CRL:     mm    w  d                  US EDC:

Subchorionic hemorrhage:  None visualized.

Maternal uterus/adnexae: No adnexal mass or free fluid.
IMPRESSION: Single intrauterine gestational sac in the lower uterine segment. No
yolk sac or fetal pole visualized. Findings meet definitive criteria
for failed pregnancy. This follows SRU consensus guidelines:
Diagnostic Criteria for Nonviable Pregnancy Early in the First
Trimester. N Engl J Med [O1];[DATE].

## 2020-01-31 MED ORDER — ONETOUCH ULTRA 2 W/DEVICE KIT: 1.0000 | PACK | Freq: Once | 0 refills | Status: AC

## 2020-01-31 NOTE — Telephone Encounter (Addendum)
Notified that follow up with diabetes educator cancelled because reason for appointment was for DM in pregnancy and patient not pregnant now.  We discsussed that patient does not have a PCP here and was still getting insulin from doctor she saw in [place]. Has been in [place] about a year.  We will make a referral to [facility] for PCP . Will discuss with provider re: plan.  Discussed with Dr. Macon Large and will refer to PCP so they can manage insulin orders and refer her to Diabetes education if they feel is needed.  Ilsa Bonello,RN

## 2020-01-31 NOTE — Telephone Encounter (Addendum)
Called [place] with [employer] to discuss with her if she has a PCP and that we have made a  to referral to [facility]- if not needed she can cancel. .  I left a message we were calling with some information and will call again tomorrow.( she will need to be told to go to [facility] or call them for appointment) Jaylin Roundy,RN

## 2020-01-31 NOTE — Patient Instructions (Signed)
mic Miscarriage A miscarriage is the loss of an unborn baby (fetus) before the 20th week of pregnancy. Follow these instructions at home: Medicines   Take over-the-counter and prescription medicines only as told by your doctor.  If you were prescribed antibiotic medicine, take it as told by your doctor. Do not stop taking the antibiotic even if you start to feel better.  Do not take NSAIDs unless your doctor says that this is safe for you. NSAIDs include aspirin and ibuprofen. These medicines can cause bleeding. Activity  Rest as directed. Ask your doctor what activities are safe for you.  Have someone help you at home during this time. General instructions  Write down how many pads you use each day and how soaked they are.  Watch the amount of tissue or clumps of blood (blood clots) that you pass from your vagina. Save any large amounts of tissue for your doctor.  Do not use tampons, douche, or have sex until your doctor approves.  To help you and your partner with the process of grieving, talk with your doctor or seek counseling.  When you are ready, meet with your doctor to talk about steps you should take for your health. Also, talk with your doctor about steps to take to have a healthy pregnancy in the future.  Keep all follow-up visits as told by your doctor. This is important. Contact a doctor if:  You have a fever or chills.  You have vaginal discharge that smells bad.  You have more bleeding. Get help right away if:  You have very bad cramps or pain in your back or belly.  You pass clumps of blood that are walnut-sized or larger from your vagina.  You pass tissue that is walnut-sized or larger from your vagina.  You soak more than 1 regular pad in an hour.  You get light-headed or weak.  You faint (pass out).  You have feelings of sadness that do not go away, or you have thoughts of hurting yourself. Summary  A miscarriage is the loss of an unborn baby  before the 20th week of pregnancy.  Follow your doctor's instructions for home care. Keep all follow-up appointments.  To help you and your partner with the process of grieving, talk with your doctor or seek counseling. This information is not intended to replace advice given to you by your health care provider. Make sure you discuss any questions you have with your health care provider. Document Revised: 03/03/2019 Document Reviewed: 12/15/2016 Elsevier Patient Education  2020 [employer].

## 2020-01-31 NOTE — Progress Notes (Signed)
Here for [place] results. Placed in room for Dr. Debroah Loop to review with patient. Kristin Clay

## 2020-01-31 NOTE — Progress Notes (Signed)
Ultrasounds Results Note  SUBJECTIVE HPI:  [name] is a 34 y.o. G1P0 at 25w6dby LMP [facility]. The patient denies abdominal pain or vaginal bleeding.  Upon review of the patient's records, patient was first seen in [facility] on 2/17 for bleeding.   BHCG on that day was 5356.  Ultrasound showed gestational sac no embryo.  Repeat ultrasound was performed earlier today.   Past Medical History:  Diagnosis Date  . Type I diabetes mellitus ([facility]    Past Surgical History:  Procedure Laterality Date  . female circumcision Bilateral    clitorectomy as well  . FOOT SURGERY  2018  . HAND SURGERY  2019   Social History   Socioeconomic History  . Marital status: Married    Spouse name: Not on file  . Number of children: Not on file  . Years of education: Not on file  . Highest education level: Not on file  Occupational History  . Not on file  Tobacco Use  . Smoking status: Never Smoker  . Smokeless tobacco: Never Used  Substance and Sexual Activity  . Alcohol use: Never  . Drug use: Never  . Sexual activity: Not Currently  Other Topics Concern  . Not on file  Social History Narrative  . Not on file   Social Determinants of Health   Financial Resource Strain:   . Difficulty of Paying Living Expenses: Not on file  Food Insecurity:   . Worried About RCharity fundraiserin the Last Year: Not on file  . Ran Out of Food in the Last Year: Not on file  Transportation Needs:   . Lack of Transportation (Medical): Not on file  . Lack of Transportation (Non-Medical): Not on file  Physical Activity:   . Days of Exercise per Week: Not on file  . Minutes of Exercise per Session: Not on file  Stress:   . Feeling of Stress : Not on file  Social Connections:   . Frequency of Communication with Friends and Family: Not on file  . Frequency of Social Gatherings with Friends and Family: Not on file  . Attends  Religious Services: Not on file  . Active Member of Clubs or Organizations: Not on file  . Attends CArchivistMeetings: Not on file  . Marital Status: Not on file  Intimate Partner Violence:   . Fear of Current or Ex-Partner: Not on file  . Emotionally Abused: Not on file  . Physically Abused: Not on file  . Sexually Abused: Not on file   Current Outpatient Medications on File Prior to Visit  Medication Sig Dispense Refill  . Blood Glucose Monitoring Suppl (ONE TOUCH ULTRA MINI) w/Device KIT USE 1  TO CHECK GLUCOSE ONCE FOR 1 DOSE 1 kit 0  . folic acid (FOLVITE) 1 MG tablet Take 1 mg by mouth daily.    .Marland Kitchenglucose blood test strip Use as instructed QID 100 each 12  . insulin detemir (LEVEMIR) 100 UNIT/ML injection Inject 20-40 Units into the skin See admin instructions. 40 units every morning and 20 units every night    . insulin NPH Human (NOVOLIN N) 100 UNIT/ML injection 30 units before breakfast & 20 before dinner 10 mL 3  . insulin regular (NOVOLIN R) 100 units/mL injection 15 units before breakfast & 10 units before dinner 10 mL 11  . Insulin Syringe-Needle U-100 30G X 15/64" 0.5 ML MISC 1 Device  by Does not apply route 2 (two) times daily. 100 each 5  . Lancets (ONETOUCH ULTRASOFT) lancets Use as instructed QID 100 each 12  . metoCLOPramide (REGLAN) 10 MG tablet Take 1 tablet (10 mg total) by mouth every 6 (six) hours. 20 tablet 0   No current facility-administered medications on file prior to visit.   Allergies  Allergen Reactions  . Pork-Derived Products     Pt does not want any pork products    I have reviewed patient's Past Medical Hx, Surgical Hx, Family Hx, Social Hx, medications and allergies.   Review of Systems Review of Systems  Constitutional: Negative for fever and chills.  Gastrointestinal: Negative for nausea, vomiting, abdominal pain, diarrhea and constipation.  Genitourinary: Negative for dysuria.  Musculoskeletal: Negative for back pain.   Neurological: Negative for dizziness and weakness.    Physical Exam  LMP 11/16/2019   GENERAL: Well-developed, well-nourished female in no acute distress.  HEENT: Normocephalic, atraumatic.   LUNGS: Effort normal ABDOMEN: soft, non-tender HEART: Regular rate  SKIN: Warm, dry and without erythema PSYCH: Normal mood and affect NEURO: Alert and oriented x 4  LAB RESULTS No results found for this or any previous visit (from the past 24 hour(s)).  IMAGING US OB Transvaginal  Result Date: 01/31/2020 CLINICAL DATA:  Threatened abortion EXAM: TRANSVAGINAL OB ULTRASOUND TECHNIQUE: Transvaginal ultrasound was performed for complete evaluation of the gestation as well as the maternal uterus, adnexal regions, and pelvic cul-de-sac. COMPARISON:  01/10/2020 FINDINGS: Intrauterine gestational sac: Single, in the lower uterine segment Yolk sac:  Not visualized Embryo:  Not visualized Cardiac Activity: Not visualized Heart Rate:  bpm MSD:   mm    w     d CRL:     mm    w  d                  [place] EDC: Subchorionic hemorrhage:  None visualized. Maternal uterus/adnexae: No adnexal mass or free fluid. IMPRESSION: Single intrauterine gestational sac in the lower uterine segment. No yolk sac or fetal pole visualized. Findings meet definitive criteria for failed pregnancy. This follows SRU consensus guidelines: Diagnostic Criteria for Nonviable Pregnancy Early in the First Trimester. Kristin Clay J Med [phone]. Electronically Signed   By: Kristin Baptise M.D.   On: 01/31/2020 09:03   US OB LESS THAN 14 WEEKS WITH OB TRANSVAGINAL  Result Date: 01/10/2020 CLINICAL DATA:  Positive pregnancy test with vaginal bleeding for 2 days. EXAM: OBSTETRIC <14 WK [place] AND TRANSVAGINAL OB US TECHNIQUE: Both transabdominal and transvaginal ultrasound examinations were performed for complete evaluation of the gestation as well as the maternal uterus, adnexal regions, and pelvic cul-de-sac. Transvaginal technique was performed to assess  early pregnancy. COMPARISON:  None. FINDINGS: Intrauterine gestational sac: Present Yolk sac:  Present Embryo:  None Cardiac Activity: N/A Heart Rate: N/A bpm MSD: 5.1 mm   5 w   2 d Subchorionic hemorrhage:  Small to moderate Maternal uterus/adnexae: Normal ovaries. Corpus luteum cyst noted on the left. IMPRESSION: Intrauterine gestational sac estimated at 5 weeks and 2 days gestation. There is a yolk sac but no embryonic pole. Recommend correlation with serial quantitative beta HCG levels and follow-up ultrasound examination in 10 days to 2 weeks. Small to moderate subchorionic hemorrhage is noted. Normal ovaries. Electronically Signed   By: Kristin Sanes M.D.   On: 01/10/2020 13:48    ASSESSMENT 1. Language barrier   2. Blighted ovum     PLAN Discharge home in stable  condition Option of expectant management, Cytotec, D&C discussed with interpreter, will elected to wait and RTC 1 week, precautions given  Kristin Mode, MD  01/31/2020  9:25 AM

## 2020-02-01 ENCOUNTER — Other Ambulatory Visit: Payer: Self-pay | Admitting: Lactation Services

## 2020-02-01 MED ORDER — ONETOUCH DELICA LANCETS 33G MISC: 1.0000 | Freq: Four times a day (QID) | 2 refills | Status: DC

## 2020-02-01 NOTE — Progress Notes (Signed)
Ordered new lancets at request to [facility] to fit meter patient has. Office is working on assisting patient with follow up by PCP.

## 2020-02-02 ENCOUNTER — Other Ambulatory Visit: Payer: Self-pay

## 2020-02-06 ENCOUNTER — Encounter: Payer: Self-pay | Admitting: *Deleted

## 2020-02-06 NOTE — Telephone Encounter (Addendum)
I called Margaretmary Lombard with [employer] [phone] and we heard a message " voicemail is full and cannot accept messages. ". I will send a letter and also make a note on  Her upcoming appointment.  Tywan Siever,RN

## 2020-02-09 ENCOUNTER — Encounter: Payer: Self-pay | Admitting: Obstetrics & Gynecology

## 2020-02-09 ENCOUNTER — Encounter: Payer: 59 | Admitting: Obstetrics & Gynecology

## 2020-02-09 ENCOUNTER — Ambulatory Visit (INDEPENDENT_AMBULATORY_CARE_PROVIDER_SITE_OTHER): Payer: 59 | Admitting: Obstetrics & Gynecology

## 2020-02-09 ENCOUNTER — Other Ambulatory Visit: Payer: Self-pay

## 2020-02-09 VITALS — BP 111/78 | HR 88 | Wt 210.9 lb

## 2020-02-09 DIAGNOSIS — O020 Blighted ovum and nonhydatidiform mole: Secondary | ICD-10-CM

## 2020-02-09 DIAGNOSIS — E1069 Type 1 diabetes mellitus with other specified complication: Secondary | ICD-10-CM | POA: Diagnosis not present

## 2020-02-09 DIAGNOSIS — Z794 Long term (current) use of insulin: Secondary | ICD-10-CM

## 2020-02-09 NOTE — Progress Notes (Signed)
GYNECOLOGY OFFICE VISIT NOTE  History:   Kristin Clay is a 34 y.o. G1P0010 here today for follow up after recent diagnosis of SAB/blighted ovum.  Arabic interpreter present for this encounter.  After diagnosis on 01/31/20, patient had minimal bleeding. No abdominal pain.  She denies any abnormal vaginal discharge, bleeding, pelvic pain or other concerns.    Past Medical History:  Diagnosis Date  . Type I diabetes mellitus ([facility])     Past Surgical History:  Procedure Laterality Date  . female circumcision Bilateral    clitorectomy as well  . FOOT SURGERY  2018  . HAND SURGERY  2019    The following portions of the patient's history were reviewed and updated as appropriate: allergies, current medications, past family history, past medical history, past social history, past surgical history and problem list.    Review of Systems:  Pertinent items noted in HPI and remainder of comprehensive ROS otherwise negative.  Physical Exam:  BP 111/78   Pulse 88   Wt 210 lb 14.4 oz (95.7 kg)   BMI 39.85 kg/m  CONSTITUTIONAL: Well-developed, well-nourished female in no acute distress.  MUSCULOSKELETAL: Normal range of motion. No edema noted. NEUROLOGIC: Alert and oriented to person, place, and time. Normal muscle tone coordination. No cranial nerve deficit noted. PSYCHIATRIC: Normal mood and affect. Normal behavior. Normal judgment and thought content. CARDIOVASCULAR: Normal heart rate noted RESPIRATORY: Effort and breath sounds normal, no problems with respiration noted ABDOMEN: No masses noted. No other overt distention noted.   PELVIC: Deferred  Labs and Imaging No results found for this or any previous visit (from the past 168 hour(s)). US OB Transvaginal  Result Date: 01/31/2020 CLINICAL DATA:  Threatened abortion EXAM: TRANSVAGINAL OB ULTRASOUND TECHNIQUE: Transvaginal ultrasound was performed for complete evaluation of the gestation as well as the maternal uterus,  adnexal regions, and pelvic cul-de-sac. COMPARISON:  01/10/2020 FINDINGS: Intrauterine gestational sac: Single, in the lower uterine segment Yolk sac:  Not visualized Embryo:  Not visualized Cardiac Activity: Not visualized Heart Rate:  bpm MSD:   mm    w     d CRL:     mm    w  d                  [place] EDC: Subchorionic hemorrhage:  None visualized. Maternal uterus/adnexae: No adnexal mass or free fluid. IMPRESSION: Single intrauterine gestational sac in the lower uterine segment. No yolk sac or fetal pole visualized. Findings meet definitive criteria for failed pregnancy. This follows SRU consensus guidelines: Diagnostic Criteria for Nonviable Pregnancy Early in the First Trimester. Macy Mis J Med [phone]. Electronically Signed   By: Charlett Nose M.D.   On: 01/31/2020 09:03   US OB LESS THAN 14 WEEKS WITH OB TRANSVAGINAL  Result Date: 01/10/2020 CLINICAL DATA:  Positive pregnancy test with vaginal bleeding for 2 days. EXAM: OBSTETRIC <14 WK [place] AND TRANSVAGINAL OB US TECHNIQUE: Both transabdominal and transvaginal ultrasound examinations were performed for complete evaluation of the gestation as well as the maternal uterus, adnexal regions, and pelvic cul-de-sac. Transvaginal technique was performed to assess early pregnancy. COMPARISON:  None. FINDINGS: Intrauterine gestational sac: Present Yolk sac:  Present Embryo:  None Cardiac Activity: N/A Heart Rate: N/A bpm MSD: 5.1 mm   5 w   2 d Subchorionic hemorrhage:  Small to moderate Maternal uterus/adnexae: Normal ovaries. Corpus luteum cyst noted on the left. IMPRESSION: Intrauterine gestational sac estimated at 5 weeks and 2 days gestation. There  is a yolk sac but no embryonic pole. Recommend correlation with serial quantitative beta HCG levels and follow-up ultrasound examination in 10 days to 2 weeks. Small to moderate subchorionic hemorrhage is noted. Normal ovaries. Electronically Signed   By: Marijo Sanes M.D.   On: 01/10/2020 13:48      Assessment  and Plan:    1. Blighted ovum Unsure if patient has passed this tissue, will obtain another scan.  If still present, will offer misoprostol. Bleeding precautions reviewed. - US OB Transvaginal; Future  2. Type 1 diabetes mellitus with other specified complication (HCC) Referred for management of her T1DM. - Ambulatory referral to [facility]  Routine preventative health maintenance measures emphasized. Please refer to After Visit Summary for other counseling recommendations.   Return for any gynecologic concerns.    Total face-to-face time with patient: 10 minutes.  Over 50% of encounter was spent on counseling and coordination of care.   Verita Schneiders, MD, [place] for [employer], [place]

## 2020-02-09 NOTE — Patient Instructions (Signed)
Return to clinic for any scheduled appointments or for any gynecologic concerns as needed.   

## 2020-02-19 ENCOUNTER — Ambulatory Visit (HOSPITAL_COMMUNITY): Admission: RE | Admit: 2020-02-19 | Payer: 59 | Source: Ambulatory Visit

## 2020-02-22 ENCOUNTER — Other Ambulatory Visit: Payer: Self-pay

## 2020-02-22 ENCOUNTER — Emergency Department (HOSPITAL_COMMUNITY): Payer: 59

## 2020-02-22 ENCOUNTER — Encounter: Payer: Self-pay | Admitting: Adult Health Nurse Practitioner

## 2020-02-22 ENCOUNTER — Ambulatory Visit (INDEPENDENT_AMBULATORY_CARE_PROVIDER_SITE_OTHER): Payer: 59 | Admitting: Adult Health Nurse Practitioner

## 2020-02-22 ENCOUNTER — Emergency Department (HOSPITAL_COMMUNITY)
Admission: EM | Admit: 2020-02-22 | Discharge: 2020-02-22 | Disposition: A | Discharge status: Home / Self Care | Payer: 59 | Attending: Emergency Medicine | Admitting: Emergency Medicine

## 2020-02-22 ENCOUNTER — Encounter (HOSPITAL_COMMUNITY): Payer: Self-pay | Admitting: Emergency Medicine

## 2020-02-22 VITALS — BP 128/85 | HR 89 | Temp 98.4°F | Ht 63.0 in | Wt 121.0 lb

## 2020-02-22 DIAGNOSIS — E1010 Type 1 diabetes mellitus with ketoacidosis without coma: Secondary | ICD-10-CM

## 2020-02-22 DIAGNOSIS — R519 Headache, unspecified: Secondary | ICD-10-CM | POA: Diagnosis not present

## 2020-02-22 DIAGNOSIS — Z794 Long term (current) use of insulin: Secondary | ICD-10-CM | POA: Diagnosis not present

## 2020-02-22 DIAGNOSIS — Z758 Other problems related to medical facilities and other health care: Secondary | ICD-10-CM

## 2020-02-22 DIAGNOSIS — R112 Nausea with vomiting, unspecified: Secondary | ICD-10-CM | POA: Insufficient documentation

## 2020-02-22 DIAGNOSIS — E1065 Type 1 diabetes mellitus with hyperglycemia: Secondary | ICD-10-CM | POA: Diagnosis present

## 2020-02-22 DIAGNOSIS — Z789 Other specified health status: Secondary | ICD-10-CM | POA: Diagnosis not present

## 2020-02-22 DIAGNOSIS — R739 Hyperglycemia, unspecified: Secondary | ICD-10-CM

## 2020-02-22 HISTORY — DX: Type 1 diabetes mellitus with ketoacidosis without coma: E10.10

## 2020-02-22 LAB — BASIC METABOLIC PANEL
Anion gap: 9 (ref 5–15)
BUN: 17 mg/dL (ref 6–20)
CO2: 21 mmol/L — ABNORMAL LOW (ref 22–32)
Calcium: 9.2 mg/dL (ref 8.9–10.3)
Chloride: 105 mmol/L (ref 98–111)
Creatinine, Ser: 0.49 mg/dL (ref 0.44–1.00)
GFR calc Af Amer: >60 mL/min (ref >60)
GFR calc non Af Amer: >60 mL/min (ref >60)
Glucose, Bld: 309 mg/dL — ABNORMAL HIGH (ref 70–99)
Potassium: 4.1 mmol/L (ref 3.5–5.1)
Sodium: 135 mmol/L (ref 135–145)

## 2020-02-22 LAB — POCT URINALYSIS DIP (CLINITEK)
Bilirubin, UA: NEGATIVE
Glucose, UA: 500 mg/dL — AB
Ketones, POC UA: NEGATIVE mg/dL
Nitrite, UA: NEGATIVE
POC PROTEIN,UA: NEGATIVE
Spec Grav, UA: 1.025 (ref 1.010–1.025)
Urobilinogen, UA: 0.2 E.U./dL
pH, UA: 5.5 (ref 5.0–8.0)

## 2020-02-22 LAB — URINALYSIS, ROUTINE W REFLEX MICROSCOPIC
Bacteria, UA: NONE SEEN
Bilirubin Urine: NEGATIVE
Glucose, UA: >=500 mg/dL — AB
Ketones, ur: NEGATIVE mg/dL
Leukocytes,Ua: NEGATIVE
Nitrite: NEGATIVE
Protein, ur: NEGATIVE mg/dL
Specific Gravity, Urine: 1.028 (ref 1.005–1.030)
pH: 5 (ref 5.0–8.0)

## 2020-02-22 LAB — POCT URINE PREGNANCY: Preg Test, Ur: NEGATIVE

## 2020-02-22 LAB — CBC
HCT: 42.8 % (ref 36.0–46.0)
Hemoglobin: 13.9 g/dL (ref 12.0–15.0)
MCH: 28.1 pg (ref 26.0–34.0)
MCHC: 32.5 g/dL (ref 30.0–36.0)
MCV: 86.5 fL (ref 80.0–100.0)
Platelets: 253 10*3/uL (ref 150–400)
RBC: 4.95 MIL/uL (ref 3.87–5.11)
RDW: 12.3 % (ref 11.5–15.5)
WBC: 5.6 10*3/uL (ref 4.0–10.5)
nRBC: 0 % (ref 0.0–0.2)

## 2020-02-22 LAB — CBG MONITORING, ED
Glucose-Capillary: 311 mg/dL — ABNORMAL HIGH (ref 70–99)
Glucose-Capillary: 239 mg/dL — ABNORMAL HIGH (ref 70–99)

## 2020-02-22 LAB — GLUCOSE, POCT (MANUAL RESULT ENTRY): POC Glucose: 361 mg/dl — AB (ref 70–99)

## 2020-02-22 LAB — I-STAT BETA HCG BLOOD, ED (MC, WL, AP ONLY): I-stat hCG, quantitative: <5 m[IU]/mL (ref <5)

## 2020-02-22 IMAGING — CT CT HEAD W/O CM
4 series · 17 of 47 positions shown, 19 images · non-contrast
Comparison: None.

CLINICAL DATA: Headache

EXAM:
CT HEAD WITHOUT CONTRAST
TECHNIQUE: Contiguous axial images were obtained from the base of the skull
through the vertex without intravenous contrast.

[Series 2: head wo · axial · 0.41mm/px · z∈[+1234,+1354]mm · 7 of 32 slices shown, 9 images]
[im 4/32  brain]
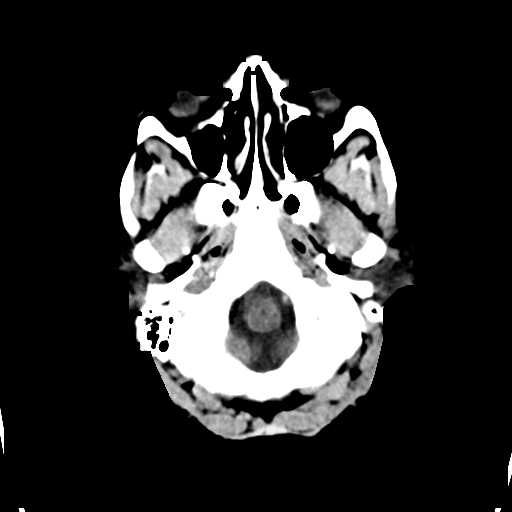
[im 4/32  bone]
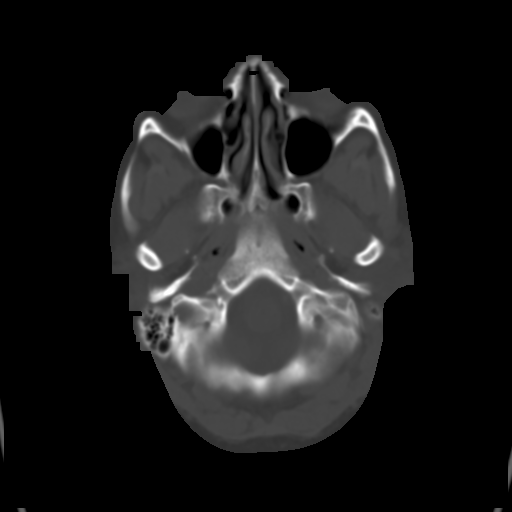
[im 8/32  brain]
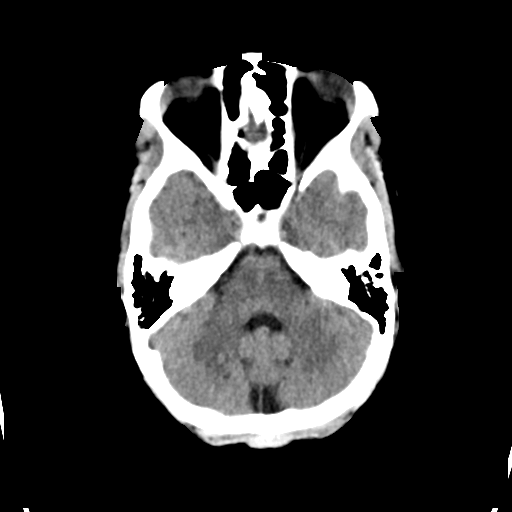
[im 12/32  brain]
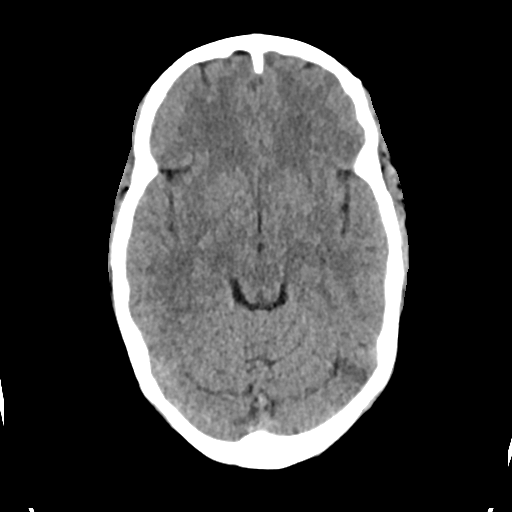
[im 16/32  brain]
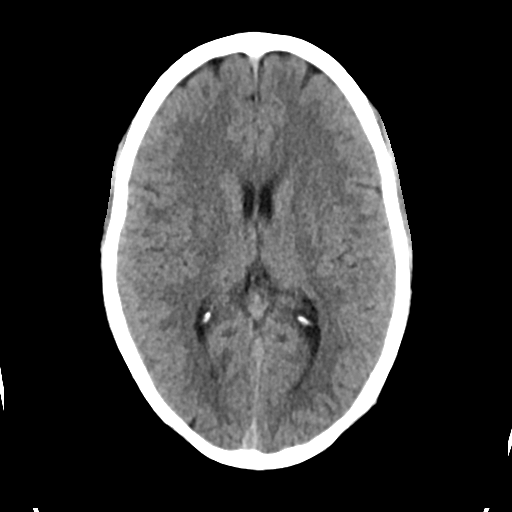
[im 20/32  brain]
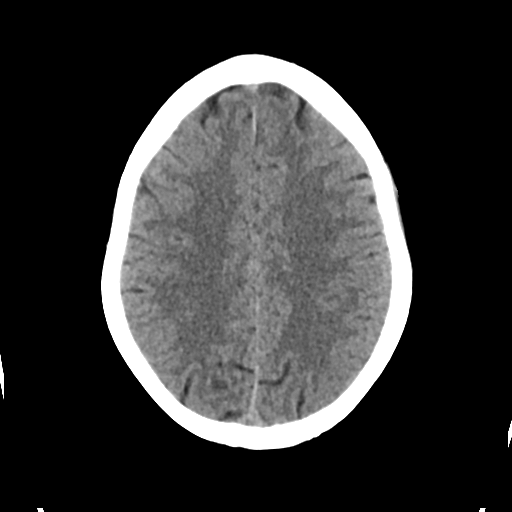
[im 20/32  bone]
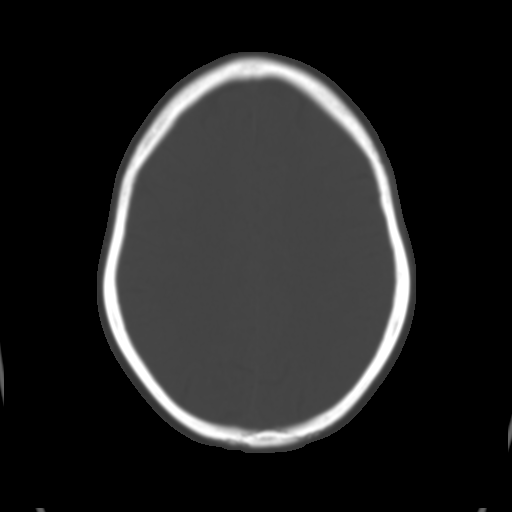
[im 24/32  brain]
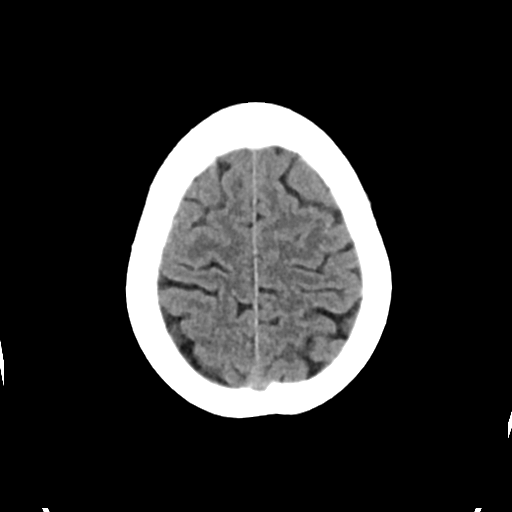
[im 28/32  brain]
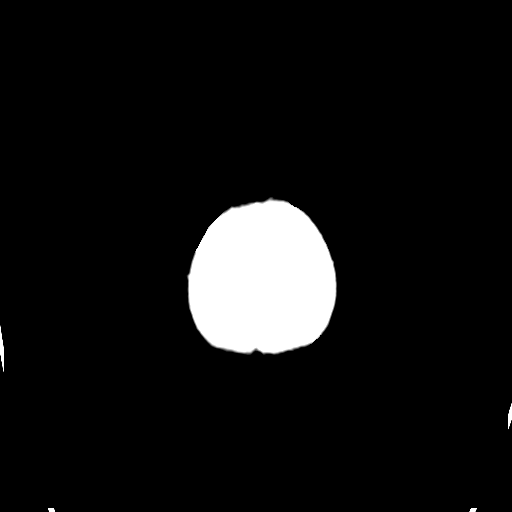

[Series 3: head bone · axial · 0.41mm/px · z∈[+1232,+1288]mm · 4 of 79 slices shown]
[im 8/79  bone]
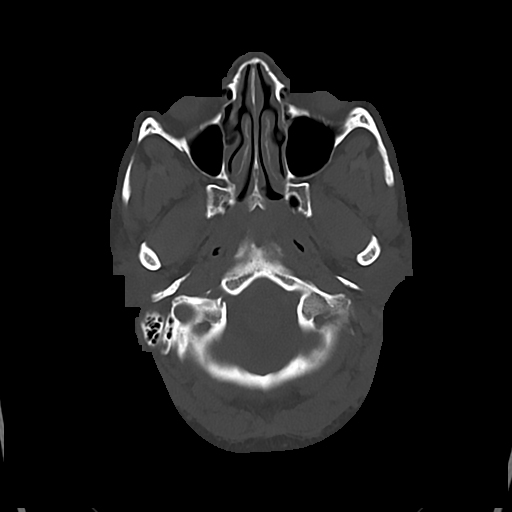
[im 16/79  bone]
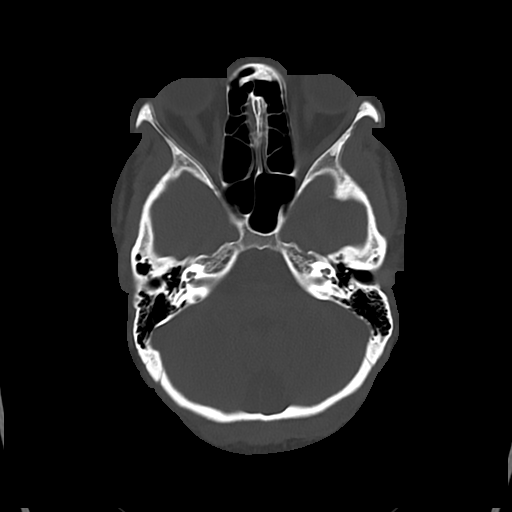
[im 24/79  bone]
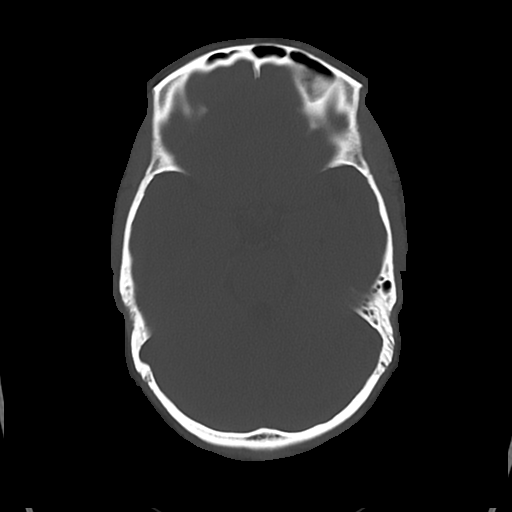
[im 36/79  bone]
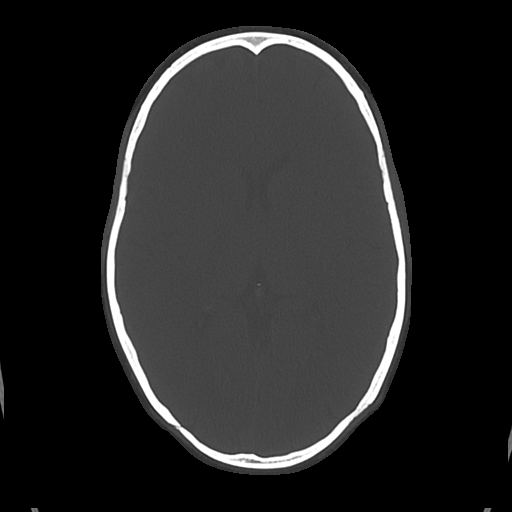

[Series 4: cor soft · coronal · 0.32mm/px · 3 of 66 slices shown]
[im 22/66  brain]
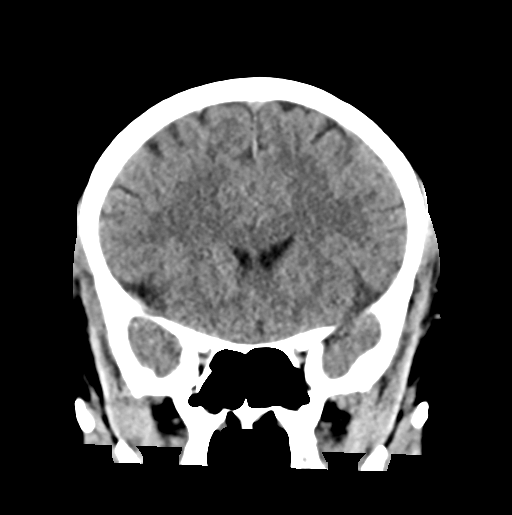
[im 29/66  brain]
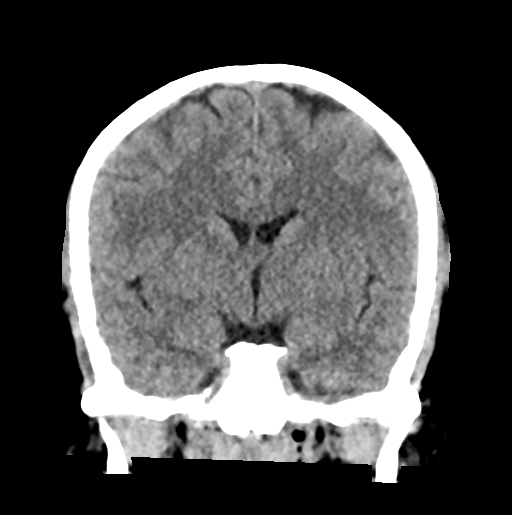
[im 37/66  brain]
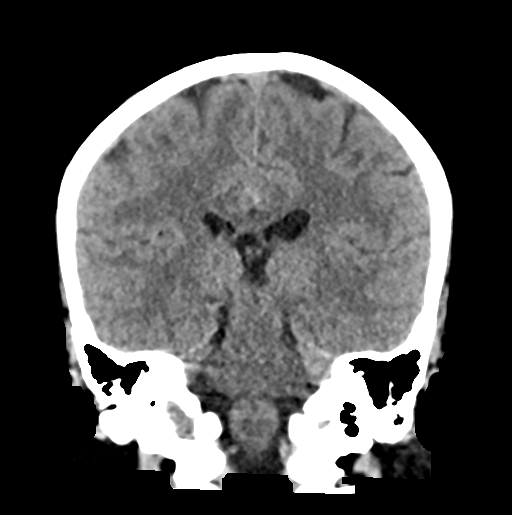

[Series 5: sag soft · sagittal · 0.32mm/px · 3 of 54 slices shown]
[im 18/54  brain]
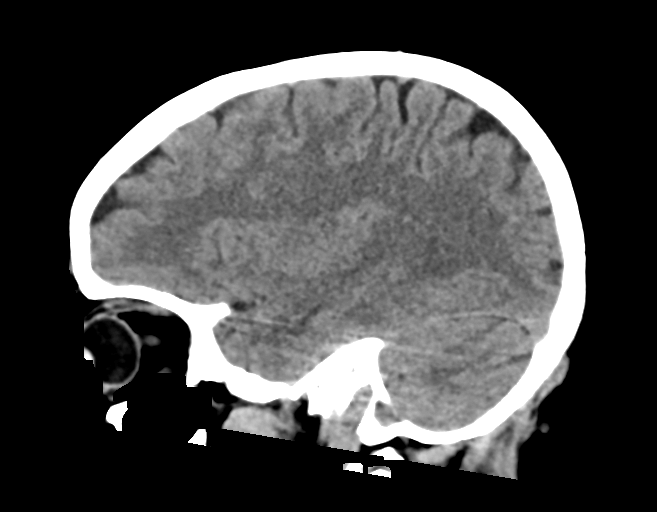
[im 27/54  brain]
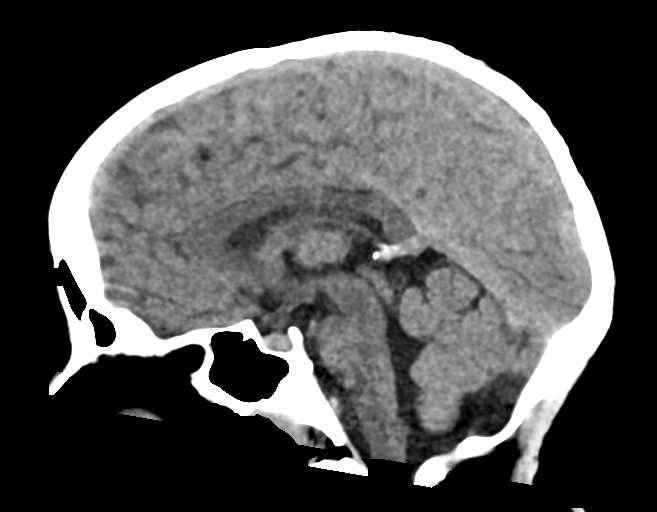
[im 36/54  brain]
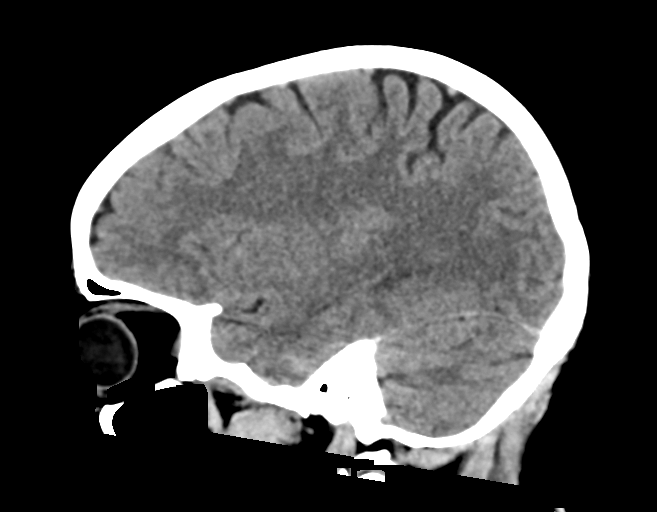

[17 of 47 positions shown; findings below may reference images not displayed]

FINDINGS: Brain: No evidence of acute infarction, hemorrhage, hydrocephalus,
extra-axial collection or mass lesion/mass effect.

Vascular: No hyperdense vessel or unexpected calcification.

Skull: Normal. Negative for fracture or focal lesion.

Sinuses/Orbits: No acute finding.

Other: None.
IMPRESSION: No acute intracranial process.

## 2020-02-22 MED ORDER — BLOOD GLUCOSE METER KIT: PACK | 0 refills | Status: DC

## 2020-02-22 MED ORDER — SODIUM CHLORIDE 0.9 % IV BOLUS
1000.0000 mL | Freq: Once | INTRAVENOUS | Status: AC
  Administered 2020-02-22: 1000 mL via INTRAVENOUS

## 2020-02-22 MED ORDER — PROMETHAZINE HCL 25 MG/ML IJ SOLN
12.5000 mg | Freq: Once | INTRAMUSCULAR | Status: AC
  Administered 2020-02-22: 12.5 mg via INTRAVENOUS
  Filled 2020-02-22: qty 1

## 2020-02-22 MED ORDER — ONDANSETRON 4 MG PO TBDP: 4.0000 mg | ORAL_TABLET | Freq: Three times a day (TID) | ORAL | 0 refills | Status: DC | PRN

## 2020-02-22 NOTE — ED Triage Notes (Signed)
Pt went to her primary MD today and told him she has been vomiting and her CBG has been running high. She states it was 300's at the MD today. He advised her to come here. Pt hasn't missed any doses of her insulin except this morning.

## 2020-02-22 NOTE — ED Notes (Signed)
Pt in CT.

## 2020-02-22 NOTE — ED Provider Notes (Signed)
[facility] EMERGENCY DEPARTMENT Provider Note   CSN: [id number] Arrival date & time: 02/22/20  1602     History Chief Complaint  Patient presents with   Hyperglycemia    Kristin Clay is a 34 y.o. female. Patient has a Optometrist at the bedside. HPI Patient presents with hyperglycemia.  Went to PCP today and found her sugar 300.  Has had nausea and vomiting.  Reported 2 to 3 days of feeling bad.  Also has a headache.  Headache came first.  Is dull and pounding over most of her head.  She is on insulin but has been out of testing supplies so not checked it.  Feels of her sugars been high for about a week however.  Denies pregnancy.  Patient has not tend to get headaches.    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   DKA (diabetic ketoacidoses) ([place]) 10/27/2019   Nausea & vomiting 10/27/2019   Acute lower UTI 10/27/2019   Syncope     Past Surgical History:  Procedure Laterality Date   female circumcision Bilateral    clitorectomy as well   FOOT SURGERY  2018   HAND SURGERY  2019     OB History    Gravida  1   Para      Term      Preterm      AB  1   Living        SAB  1   TAB      Ectopic      Multiple      Live Births              Family History  Family history unknown: Yes    Social History   Tobacco Use   Smoking status: Never Smoker   Smokeless tobacco: Never Used  Substance Use Topics   Alcohol use: Never   Drug use: Never    Home Medications Prior to Admission medications   Medication Sig Start Date End Date Taking? Authorizing Provider  folic acid (FOLVITE) 1 MG tablet Take 1 mg by mouth daily.   Yes [provider]  insulin detemir (LEVEMIR) 100  UNIT/ML injection Inject 20-40 Units into the skin See admin instructions. 40 units every morning and 20 units every night   Yes [provider]  blood glucose meter kit and supplies Dispense based on patient and insurance preference. Use up to four times daily as directed. (FOR ICD-10 E10.9, E11.9). 02/22/20   Wendall Mola, NP  glucose blood test strip Use as instructed QID 01/30/20   Anyanwu, Sallyanne Havers, MD  insulin NPH Human (NOVOLIN N) 100 UNIT/ML injection 30 units before breakfast & 20 before dinner Patient not taking: Reported on 02/22/2020 01/30/20   Anyanwu, Sallyanne Havers, MD  insulin regular (NOVOLIN R) 100 units/mL injection 15 units before breakfast & 10 units before dinner Patient not taking: Reported on 02/22/2020 01/30/20   Osborne Oman, MD  Insulin Syringe-Needle U-100 30G X 15/64" 0.5 ML MISC 1 Device by Does not apply route 2 (two) times daily. 01/30/20   Anyanwu, Sallyanne Havers, MD  metoCLOPramide (REGLAN) 10 MG tablet Take 1 tablet (10 mg total) by mouth every 6 (six) hours. Patient not taking:  Reported on 02/09/2020 10/26/19   Caccavale, Sophia, PA-C  ondansetron (ZOFRAN-ODT) 4 MG disintegrating tablet Take 1 tablet (4 mg total) by mouth every 8 (eight) hours as needed for nausea or vomiting. 02/22/20   Davonna Belling, MD  OneTouch Delica Lancets 46T MISC 1 Device by Does not apply route in the morning, at noon, in the evening, and at bedtime. 02/01/20   Osborne Oman, MD    Allergies    Pork-derived products  Review of Systems   Review of Systems  Constitutional: Negative for appetite change.  HENT: Negative for congestion.   Respiratory: Negative for shortness of breath.   Cardiovascular: Negative for chest pain.  Gastrointestinal: Positive for nausea and vomiting. Negative for abdominal pain.  Genitourinary: Negative for flank pain.  Musculoskeletal: Negative for back pain.  Skin: Negative for rash.  Neurological: Positive for headaches.  Psychiatric/Behavioral:  Negative for confusion.    Physical Exam Updated Vital Signs BP 133/84    Pulse 92    Temp 98.7 F (37.1 C) (Oral)    Resp 12    Ht '5\' 3"'  (1.6 m)    Wt 54.9 kg    SpO2 100%    BMI 21.43 kg/m   Physical Exam Vitals and nursing note reviewed.  HENT:     Head: Atraumatic.  Eyes:     Pupils: Pupils are equal, round, and reactive to light.  Cardiovascular:     Rate and Rhythm: Normal rate.  Pulmonary:     Breath sounds: Normal breath sounds.  Abdominal:     Tenderness: There is no abdominal tenderness.  Musculoskeletal:        General: No tenderness.     Cervical back: Neck supple.  Skin:    General: Skin is warm.     Capillary Refill: Capillary refill takes less than 2 seconds.  Neurological:     Mental Status: She is alert and oriented to person, place, and time.  Psychiatric:        Mood and Affect: Mood normal.     ED Results / Procedures / Treatments   Labs (all labs ordered are listed, but only abnormal results are displayed) Labs Reviewed  BASIC METABOLIC PANEL - Abnormal; Notable for the following components:      Result Value   CO2 21 (*)    Glucose, Bld 309 (*)    All other components within normal limits  URINALYSIS, ROUTINE W REFLEX MICROSCOPIC - Abnormal; Notable for the following components:   Color, Urine STRAW (*)    Glucose, UA >=500 (*)    Hgb urine dipstick SMALL (*)    All other components within normal limits  CBG MONITORING, ED - Abnormal; Notable for the following components:   Glucose-Capillary 311 (*)    All other components within normal limits  CBG MONITORING, ED - Abnormal; Notable for the following components:   Glucose-Capillary 239 (*)    All other components within normal limits  CBC  I-STAT BETA HCG BLOOD, ED ([facility], [facility], [facility] ONLY)  CBG MONITORING, ED    EKG None  Radiology CT Head Wo Contrast  Result Date: 02/22/2020 CLINICAL DATA:  Headache EXAM: CT HEAD WITHOUT CONTRAST TECHNIQUE: Contiguous axial images were obtained from the  base of the skull through the vertex without intravenous contrast. COMPARISON:  None. FINDINGS: Brain: No evidence of acute infarction, hemorrhage, hydrocephalus, extra-axial collection or mass lesion/mass effect. Vascular: No hyperdense vessel or unexpected calcification. Skull: Normal. Negative for fracture or focal lesion. Sinuses/Orbits: No  acute finding. Other: None. IMPRESSION: No acute intracranial process. Electronically Signed   By: Zerita Boers M.D.   On: 02/22/2020 18:48    Procedures Procedures (including critical care time)  Medications Ordered in ED Medications  sodium chloride 0.9 % bolus 1,000 mL (1,000 mLs Intravenous New Bag/Given 02/22/20 1751)  promethazine (PHENERGAN) injection 12.5 mg (12.5 mg Intravenous Given 02/22/20 1752)    ED Course  I have reviewed the triage vital signs and the nursing notes.  Pertinent labs & imaging results that were available during my care of the patient were reviewed by me and considered in my medical decision making (see chart for details).    MDM Rules/Calculators/A&P                     Patient with nausea vomiting headache and mild hyperglycemia.  Sugar improved.  Fluids given.  Has insulin at home and has a new meter now so she can follow her sugar.  Head CT done due to headaches nausea vomiting.  Head CT reassuring.  Feels better after treatment.  Will discharge home.  PCP follow-up Final Clinical Impression(s) / ED Diagnoses Final diagnoses:  Hyperglycemia  Nonintractable headache, unspecified chronicity pattern, unspecified headache type  Nausea and vomiting, intractability of vomiting not specified, unspecified vomiting type    Rx / DC Orders ED Discharge Orders         Ordered    ondansetron (ZOFRAN-ODT) 4 MG disintegrating tablet  Every 8 hours PRN     02/22/20 1959           Davonna Belling, MD 02/22/20 2006

## 2020-02-22 NOTE — Progress Notes (Signed)
  Chief Complaint  Patient presents with  . Establish Care    Pt needs to get under dr care to manage Diabetes and she has been vomiting for the pass 3 days.    HPI   Patient is a very pleasant 34 year old female from [place].  She works at [employer].  She has come to the [place] over the past year and has a history of type 1 diabetes diagnosed at 19.  She is married.  Recently had a miscarriage, and was hospitalized in December for DKA.  Chart review was extensive and I spent approximately 20 minutes discussing the patient's past and her current regimen with the translator.    Patient does not check her blood sugar regularly because she does not have a glucometer that was affordable.  She last took 20 units of Lantus last night before bed.  Last meal was chicken pizza at  11 am.  Last insulin dose was 20units before bed yesterday.   When questioned, she admits to headache, blurry vision, nausea, vomiting x 3 days, minimal po intake, dizziness, pain in her abdomen and flank, and extreme fatigue.  She endorses polyuria but no polydipsia.    She was hospitalized in December 2020 in DKA.  She suffered a miscarriage at that time.  She was also treated for pyelonephritis.  She does not want to go back to the hospital because she stayed so long last time.  She also has very little education or understanding surrounding her type 1 diabetes.  She lastly, she does not check her blood sugars because she does not have a glucometer.  In the context of her blood sugar, her worsening symptoms, and her lack of p.o. intake with insulin and recent hospitalization for DKA, I have advised her to go to the emergency room.  I have already made her appointment for Tuesday morning to follow-up with me if she is discharged by then but I cannot safely medically treat her in an outpatient setting on a Friday afternoon before a 4-day weekend.  She needs fluid resuscitation, and medical treatment for her blood sugar and her  vomiting.   A total of 50 minutes was spent with the patient, greater than 50% of which was in counseling/coordination of care regarding chronic medical problems, review of most recent office visit notes, review of most recent blood work results, review of all medications, review of new medication and side effects, need to follow-up with psychiatry for generalized anxiety disorder, diet and nutrition, discussion related to work note, prognosis and need for follow-up.   1. Nausea and vomiting, intractability of vomiting not specified, unspecified vomiting type   2. Diabetic ketoacidosis without coma associated with type 1 diabetes mellitus (HCC)     I have expressed my concern for her safety and to the translator will accompany her to the ER at [facility].  We will follow-up once discharged.   Patient verbalized understanding.  Elyse Jarvis, NP

## 2020-02-22 NOTE — Patient Instructions (Signed)
° ° ° °  If you have lab work done today you will be contacted with your lab results within the next 2 weeks.  If you have not heard from us then please contact us. The fastest way to get your results is to register for My Chart. ° ° °IF you received an x-ray today, you will receive an invoice from [facility]. Please contact [facility] at [phone] with questions or concerns regarding your invoice.  ° °IF you received labwork today, you will receive an invoice from [facility]. Please contact [facility] at [phone] with questions or concerns regarding your invoice.  ° °Our billing staff will not be able to assist you with questions regarding bills from these companies. ° °You will be contacted with the lab results as soon as they are available. The fastest way to get your results is to activate your My Chart account. Instructions are located on the last page of this paperwork. If you have not heard from us regarding the results in 2 weeks, please contact this office. °  ° ° ° °

## 2020-02-27 ENCOUNTER — Ambulatory Visit: Payer: 59 | Admitting: Adult Health Nurse Practitioner

## 2020-02-28 ENCOUNTER — Encounter: Payer: Self-pay | Admitting: Adult Health Nurse Practitioner

## 2020-10-23 DIAGNOSIS — N12 Tubulo-interstitial nephritis, not specified as acute or chronic: Secondary | ICD-10-CM

## 2020-10-23 DIAGNOSIS — E1110 Type 2 diabetes mellitus with ketoacidosis without coma: Secondary | ICD-10-CM

## 2020-10-23 HISTORY — DX: Tubulo-interstitial nephritis, not specified as acute or chronic: N12

## 2020-10-23 HISTORY — DX: Type 2 diabetes mellitus with ketoacidosis without coma: E11.10

## 2020-10-24 ENCOUNTER — Other Ambulatory Visit: Payer: Self-pay

## 2020-10-24 ENCOUNTER — Inpatient Hospital Stay (HOSPITAL_COMMUNITY)
Admission: EM | Admit: 2020-10-24 | Discharge: 2020-10-28 | DRG: 689 | Disposition: A | Discharge status: Home / Self Care | Payer: 59 | Attending: Internal Medicine | Admitting: Internal Medicine

## 2020-10-24 ENCOUNTER — Emergency Department (HOSPITAL_COMMUNITY): Payer: 59

## 2020-10-24 ENCOUNTER — Other Ambulatory Visit: Payer: 59

## 2020-10-24 ENCOUNTER — Encounter (HOSPITAL_COMMUNITY): Payer: Self-pay

## 2020-10-24 DIAGNOSIS — E1010 Type 1 diabetes mellitus with ketoacidosis without coma: Secondary | ICD-10-CM | POA: Diagnosis present

## 2020-10-24 DIAGNOSIS — Z794 Long term (current) use of insulin: Secondary | ICD-10-CM

## 2020-10-24 DIAGNOSIS — A419 Sepsis, unspecified organism: Secondary | ICD-10-CM

## 2020-10-24 DIAGNOSIS — R112 Nausea with vomiting, unspecified: Secondary | ICD-10-CM | POA: Diagnosis not present

## 2020-10-24 DIAGNOSIS — I10 Essential (primary) hypertension: Secondary | ICD-10-CM | POA: Diagnosis present

## 2020-10-24 DIAGNOSIS — Z20822 Contact with and (suspected) exposure to covid-19: Secondary | ICD-10-CM | POA: Diagnosis present

## 2020-10-24 DIAGNOSIS — N10 Acute tubulo-interstitial nephritis: Principal | ICD-10-CM

## 2020-10-24 DIAGNOSIS — E876 Hypokalemia: Secondary | ICD-10-CM | POA: Diagnosis present

## 2020-10-24 DIAGNOSIS — E109 Type 1 diabetes mellitus without complications: Secondary | ICD-10-CM | POA: Diagnosis present

## 2020-10-24 DIAGNOSIS — N12 Tubulo-interstitial nephritis, not specified as acute or chronic: Secondary | ICD-10-CM

## 2020-10-24 HISTORY — DX: Type 1 diabetes mellitus without complications: E10.9

## 2020-10-24 LAB — CBC WITH DIFFERENTIAL/PLATELET
Abs Immature Granulocytes: 0.03 10*3/uL (ref 0.00–0.07)
Basophils Absolute: 0 10*3/uL (ref 0.0–0.1)
Basophils Relative: 0 %
Eosinophils Absolute: 0 10*3/uL (ref 0.0–0.5)
Eosinophils Relative: 0 %
HCT: 45.5 % (ref 36.0–46.0)
Hemoglobin: 14.9 g/dL (ref 12.0–15.0)
Immature Granulocytes: 1 %
Lymphocytes Relative: 6 %
Lymphs Abs: 0.4 10*3/uL — ABNORMAL LOW (ref 0.7–4.0)
MCH: 27.8 pg (ref 26.0–34.0)
MCHC: 32.7 g/dL (ref 30.0–36.0)
MCV: 84.9 fL (ref 80.0–100.0)
Monocytes Absolute: 0.3 10*3/uL (ref 0.1–1.0)
Monocytes Relative: 5 %
Neutro Abs: 5.7 10*3/uL (ref 1.7–7.7)
Neutrophils Relative %: 88 %
Platelets: 231 10*3/uL (ref 150–400)
RBC: 5.36 MIL/uL — ABNORMAL HIGH (ref 3.87–5.11)
RDW: 12.6 % (ref 11.5–15.5)
WBC: 6.4 10*3/uL (ref 4.0–10.5)
nRBC: 0 % (ref 0.0–0.2)

## 2020-10-24 LAB — CBG MONITORING, ED
Glucose-Capillary: 343 mg/dL — ABNORMAL HIGH (ref 70–99)
Glucose-Capillary: 307 mg/dL — ABNORMAL HIGH (ref 70–99)
Glucose-Capillary: 252 mg/dL — ABNORMAL HIGH (ref 70–99)

## 2020-10-24 LAB — URINALYSIS, COMPLETE (UACMP) WITH MICROSCOPIC
Bilirubin Urine: NEGATIVE
Glucose, UA: >=500 mg/dL — AB
Ketones, ur: 80 mg/dL — AB
Nitrite: NEGATIVE
Protein, ur: 100 mg/dL — AB
Specific Gravity, Urine: 1.027 (ref 1.005–1.030)
pH: 5 (ref 5.0–8.0)

## 2020-10-24 LAB — I-STAT BETA HCG BLOOD, ED (MC, WL, AP ONLY): I-stat hCG, quantitative: <5 m[IU]/mL (ref <5)

## 2020-10-24 LAB — BLOOD GAS, VENOUS
Acid-base deficit: 8.7 mmol/L — ABNORMAL HIGH (ref 0.0–2.0)
Bicarbonate: 14.9 mmol/L — ABNORMAL LOW (ref 20.0–28.0)
O2 Saturation: 74.7 %
Patient temperature: 98.6
pCO2, Ven: 26.5 mmHg — ABNORMAL LOW (ref 44.0–60.0)
pH, Ven: 7.368 (ref 7.250–7.430)
pO2, Ven: 45.4 mmHg — ABNORMAL HIGH (ref 32.0–45.0)

## 2020-10-24 LAB — I-STAT CHEM 8, ED
BUN: 13 mg/dL (ref 6–20)
Calcium, Ion: 1.22 mmol/L (ref 1.15–1.40)
Chloride: 104 mmol/L (ref 98–111)
Creatinine, Ser: 0.2 mg/dL — ABNORMAL LOW (ref 0.44–1.00)
Glucose, Bld: 397 mg/dL — ABNORMAL HIGH (ref 70–99)
HCT: 46 % (ref 36.0–46.0)
Hemoglobin: 15.6 g/dL — ABNORMAL HIGH (ref 12.0–15.0)
Potassium: 3.8 mmol/L (ref 3.5–5.1)
Sodium: 139 mmol/L (ref 135–145)
TCO2: 20 mmol/L — ABNORMAL LOW (ref 22–32)

## 2020-10-24 LAB — RAPID URINE DRUG SCREEN, HOSP PERFORMED
Amphetamines: NOT DETECTED
Barbiturates: NOT DETECTED
Benzodiazepines: NOT DETECTED
Cocaine: NOT DETECTED
Opiates: NOT DETECTED
Tetrahydrocannabinol: NOT DETECTED

## 2020-10-24 LAB — LACTIC ACID, PLASMA: Lactic Acid, Venous: 1.4 mmol/L (ref 0.5–1.9)

## 2020-10-24 LAB — COMPREHENSIVE METABOLIC PANEL
ALT: 12 U/L (ref 0–44)
AST: 14 U/L — ABNORMAL LOW (ref 15–41)
Albumin: 3.8 g/dL (ref 3.5–5.0)
Alkaline Phosphatase: 92 U/L (ref 38–126)
Anion gap: 17 — ABNORMAL HIGH (ref 5–15)
BUN: 14 mg/dL (ref 6–20)
CO2: 18 mmol/L — ABNORMAL LOW (ref 22–32)
Calcium: 9.3 mg/dL (ref 8.9–10.3)
Chloride: 101 mmol/L (ref 98–111)
Creatinine, Ser: 0.64 mg/dL (ref 0.44–1.00)
GFR, Estimated: >60 mL/min (ref >60)
Glucose, Bld: 374 mg/dL — ABNORMAL HIGH (ref 70–99)
Potassium: 4.2 mmol/L (ref 3.5–5.1)
Sodium: 136 mmol/L (ref 135–145)
Total Bilirubin: 1 mg/dL (ref 0.3–1.2)
Total Protein: 8.5 g/dL — ABNORMAL HIGH (ref 6.5–8.1)

## 2020-10-24 LAB — LIPASE, BLOOD: Lipase: 19 U/L (ref 11–51)

## 2020-10-24 LAB — BETA-HYDROXYBUTYRIC ACID: Beta-Hydroxybutyric Acid: 4.07 mmol/L — ABNORMAL HIGH (ref 0.05–0.27)

## 2020-10-24 LAB — RESP PANEL BY RT-PCR (FLU A&B, COVID) ARPGX2
Influenza A by PCR: NEGATIVE
Influenza B by PCR: NEGATIVE
SARS Coronavirus 2 by RT PCR: NEGATIVE

## 2020-10-24 MED ORDER — INSULIN REGULAR(HUMAN) IN NACL 100-0.9 UT/100ML-% IV SOLN
INTRAVENOUS | Status: DC
  Administered 2020-10-24: 9 [IU]/h via INTRAVENOUS
  Administered 2020-10-25: 5.5 [IU]/h via INTRAVENOUS
  Filled 2020-10-24 (×2): qty 100

## 2020-10-24 MED ORDER — LACTATED RINGERS IV BOLUS
1000.0000 mL | Freq: Once | INTRAVENOUS | Status: AC
  Administered 2020-10-24: 1000 mL via INTRAVENOUS

## 2020-10-24 MED ORDER — DEXTROSE IN LACTATED RINGERS 5 % IV SOLN: INTRAVENOUS | Status: DC

## 2020-10-24 MED ORDER — DEXTROSE 50 % IV SOLN: 0.0000 mL | INTRAVENOUS | Status: DC | PRN

## 2020-10-24 MED ORDER — LACTATED RINGERS IV SOLN: INTRAVENOUS | Status: DC

## 2020-10-24 MED ORDER — METOCLOPRAMIDE HCL 5 MG/ML IJ SOLN
10.0000 mg | Freq: Once | INTRAMUSCULAR | Status: AC
  Administered 2020-10-24: 10 mg via INTRAVENOUS
  Filled 2020-10-24: qty 2

## 2020-10-24 MED ORDER — ENOXAPARIN SODIUM 40 MG/0.4ML ~~LOC~~ SOLN
40.0000 mg | SUBCUTANEOUS | Status: DC
  Administered 2020-10-25 – 2020-10-27 (×4): 40 mg via SUBCUTANEOUS
  Filled 2020-10-24 (×4): qty 0.4

## 2020-10-24 MED ORDER — SODIUM CHLORIDE 0.9 % IV SOLN
1.0000 g | INTRAVENOUS | Status: DC
  Administered 2020-10-25 – 2020-10-26 (×3): 1 g via INTRAVENOUS
  Filled 2020-10-24 (×3): qty 10

## 2020-10-24 MED ORDER — FENTANYL CITRATE (PF) 100 MCG/2ML IJ SOLN: 12.5000 ug | INTRAMUSCULAR | Status: DC | PRN

## 2020-10-24 MED ORDER — IOHEXOL 300 MG/ML  SOLN
100.0000 mL | Freq: Once | INTRAMUSCULAR | Status: AC | PRN
  Administered 2020-10-24: 100 mL via INTRAVENOUS

## 2020-10-24 MED ORDER — DIPHENHYDRAMINE HCL 50 MG/ML IJ SOLN
25.0000 mg | Freq: Once | INTRAMUSCULAR | Status: AC
  Administered 2020-10-24: 25 mg via INTRAVENOUS
  Filled 2020-10-24: qty 1

## 2020-10-24 MED ORDER — POTASSIUM CHLORIDE 10 MEQ/100ML IV SOLN
10.0000 meq | INTRAVENOUS | Status: AC
  Administered 2020-10-24 (×2): 10 meq via INTRAVENOUS
  Filled 2020-10-24 (×2): qty 100

## 2020-10-24 MED ORDER — LACTATED RINGERS IV SOLN: INTRAVENOUS | Status: AC

## 2020-10-24 NOTE — ED Provider Notes (Signed)
[facility]-EMERGENCY DEPT Provider Note   CSN: [id number] Arrival date & time: 10/24/20  1903     History Chief Complaint  Patient presents with  . Fatigue  . Emesis    Kristin Clay is a 34 y.o. female with a past medical history of DM 1 who presents today for evaluation of multiple symptoms. Patient reports that 3 days ago she began vomiting and developing fevers. She reports that before that she had diarrhea and cough however those have both improved. She states that overall her symptoms started a week ago. She has been vomiting every 15 minutes. She denies any blood in her vomit. She states that she has been unable to tolerate p.o. intake for the past 3 days and is giving herself a reduced dose of insulin. She took a Covid test this morning however does not have results back yet. She reports that for about a week she has had dysuria, denies frequency urgency. She reports that her entire back hurts from her shoulders down to her hips. Additionally she reports abdominal pain. No chest pain. She denies shortness of breath. No known sick contacts.  No alleviating symptoms noted.   She states that she has never had any prior abdominal surgeries. No abnormal vaginal discharge. History obtained through professional Arabic speaking medical interpreter.  HPI     Past Medical History:  Diagnosis Date  . Diabetes mellitus type 1 (HCC)     There are no problems to display for this patient.   Past Surgical History:  Procedure Laterality Date  . FOOT SURGERY    . HAND SURGERY       OB History   No obstetric history on file.     Family History  Family history unknown: Yes    Social History   Tobacco Use  . Smoking status: Never Smoker  Substance Use Topics  . Alcohol use: Never  . Drug use: Never    Home Medications Prior to Admission medications   Not on File    Allergies    Patient has no known allergies.  Review of Systems    Review of Systems  Constitutional: Positive for chills, fatigue and fever.  HENT: Negative for congestion.   Respiratory: Positive for cough. Negative for shortness of breath.   Cardiovascular: Negative for chest pain, palpitations and leg swelling.  Gastrointestinal: Positive for abdominal pain, diarrhea, nausea and vomiting.  Genitourinary: Positive for dysuria. Negative for flank pain, hematuria, pelvic pain, vaginal bleeding and vaginal discharge.  Musculoskeletal: Positive for arthralgias, back pain and myalgias. Negative for neck pain.  Skin: Negative for color change and rash.  Neurological: Positive for weakness. Negative for speech difficulty and headaches.  Psychiatric/Behavioral: Negative for confusion.  All other systems reviewed and are negative.   Physical Exam Updated Vital Signs BP 134/79   Pulse (!) 104   Temp 99.4 F (37.4 C) (Rectal)   Resp (!) 21   Ht  (1.6 m)   Wt 61.2 kg   LMP  (LMP Unknown)   SpO2 100%   BMI 23.91 kg/m   Physical Exam Vitals and nursing note reviewed.  Constitutional:      Appearance: She is well-developed. She is ill-appearing.     Comments: Dry heaving in room.   HENT:     Head: Normocephalic and atraumatic.  Eyes:     General: No scleral icterus.       Right eye: No discharge.  Left eye: No discharge.     Conjunctiva/sclera: Conjunctivae normal.  Cardiovascular:     Rate and Rhythm: Regular rhythm. Tachycardia present.     Pulses: Normal pulses.     Heart sounds: Normal heart sounds.  Pulmonary:     Effort: Pulmonary effort is normal. No respiratory distress.     Breath sounds: No stridor.  Abdominal:     General: There is no distension.     Tenderness: There is abdominal tenderness ([place], worst over epigastric). There is no right CVA tenderness or left CVA tenderness.  Musculoskeletal:        General: No deformity.     Cervical back: Normal range of motion and neck supple.     Right lower leg: No edema.      Left lower leg: No edema.  Skin:    General: Skin is warm and dry.  Neurological:     General: No focal deficit present.     Mental Status: She is alert.     Cranial Nerves: No cranial nerve deficit.     Motor: No abnormal muscle tone.     Coordination: Coordination normal.  Psychiatric:        Mood and Affect: Mood normal.        Behavior: Behavior normal.     ED Results / Procedures / Treatments   Labs (all labs ordered are listed, but only abnormal results are displayed) Labs Reviewed  BLOOD GAS, VENOUS - Abnormal; Notable for the following components:      Result Value   pCO2, Ven 26.5 (*)    pO2, Ven 45.4 (*)    Bicarbonate 14.9 (*)    Acid-base deficit 8.7 (*)    All other components within normal limits  COMPREHENSIVE METABOLIC PANEL - Abnormal; Notable for the following components:   CO2 18 (*)    Glucose, Bld 374 (*)    Total Protein 8.5 (*)    AST 14 (*)    Anion gap 17 (*)    All other components within normal limits  CBC WITH DIFFERENTIAL/PLATELET - Abnormal; Notable for the following components:   RBC 5.36 (*)    Lymphs Abs 0.4 (*)    All other components within normal limits  URINALYSIS, COMPLETE (UACMP) WITH MICROSCOPIC - Abnormal; Notable for the following components:   Glucose, UA >=500 (*)    Hgb urine dipstick MODERATE (*)    Ketones, ur 80 (*)    Protein, ur 100 (*)    Leukocytes,Ua SMALL (*)    Bacteria, UA RARE (*)    All other components within normal limits  BETA-HYDROXYBUTYRIC ACID - Abnormal; Notable for the following components:   Beta-Hydroxybutyric Acid 4.07 (*)    All other components within normal limits  I-STAT CHEM 8, ED - Abnormal; Notable for the following components:   Creatinine, Ser 0.20 (*)    Glucose, Bld 397 (*)    TCO2 20 (*)    Hemoglobin 15.6 (*)    All other components within normal limits  CBG MONITORING, ED - Abnormal; Notable for the following components:   Glucose-Capillary 343 (*)    All other components  within normal limits  CBG MONITORING, ED - Abnormal; Notable for the following components:   Glucose-Capillary 307 (*)    All other components within normal limits  CBG MONITORING, ED - Abnormal; Notable for the following components:   Glucose-Capillary 252 (*)    All other components within normal limits  RESP PANEL BY RT-PCR (FLU  A&B, COVID) ARPGX2  CULTURE, BLOOD (ROUTINE X 2)  CULTURE, BLOOD (ROUTINE X 2)  URINE CULTURE  LIPASE, BLOOD  LACTIC ACID, PLASMA  RAPID URINE DRUG SCREEN, HOSP PERFORMED  BASIC METABOLIC PANEL  BASIC METABOLIC PANEL  BASIC METABOLIC PANEL  BETA-HYDROXYBUTYRIC ACID  LACTIC ACID, PLASMA  I-STAT BETA HCG BLOOD, ED ([facility], [facility], [facility] ONLY)    EKG EKG Interpretation  Date/Time:  Thursday October 24 2020 20:29:45 EST Ventricular Rate:  102 PR Interval:    QRS Duration: 78 QT Interval:  328 QTC Calculation: 428 R Axis:   60 Text Interpretation: Sinus tachycardia Ventricular premature complex Right atrial enlargement Consider left ventricular hypertrophy Confirmed by Kennis Carina [id number]) on 10/24/2020 9:08:47 PM   Radiology CT Abdomen Pelvis W Contrast  Result Date: 10/24/2020 CLINICAL DATA:  34 year old female with abdominal pain, nausea vomiting. EXAM: CT ABDOMEN AND PELVIS WITH CONTRAST TECHNIQUE: Multidetector CT imaging of the abdomen and pelvis was performed using the standard protocol following bolus administration of intravenous contrast. CONTRAST:  OMNIPAQUE IOHEXOL 300 MG/ML  SOLN COMPARISON:  None. FINDINGS: Lower chest: The visualized lung bases are clear. No intra-abdominal free air or free fluid. Hepatobiliary: No focal liver abnormality is seen. No gallstones, gallbladder wall thickening, or biliary dilatation. Pancreas: Unremarkable. No pancreatic ductal dilatation or surrounding inflammatory changes. Spleen: Normal in size without focal abnormality. Adrenals/Urinary Tract: The adrenal glands unremarkable. There is heterogeneous enhancement  of the right renal parenchyma most consistent with pyelonephritis. Correlation with urinalysis recommended. No drainable fluid collection or abscess. The left kidney is unremarkable. There is no hydronephrosis on either side. The visualized ureters and urinary bladder appear unremarkable. Stomach/Bowel: There is no bowel obstruction or active inflammation. The appendix is normal. Vascular/Lymphatic: The abdominal aorta and IVC unremarkable. No portal venous gas. There is no adenopathy. Reproductive: The uterus and ovaries are grossly unremarkable. No pelvic mass. Other: None Musculoskeletal: No acute or significant osseous findings. IMPRESSION: 1. Right-sided pyelonephritis. Correlation with urinalysis recommended. No drainable fluid collection or abscess. 2. No bowel obstruction. Normal appendix. Electronically Signed   By: Elgie Collard M.D.   On: 10/24/2020 22:39   DG Chest Portable 1 View  Result Date: 10/24/2020 CLINICAL DATA:  Cough EXAM: PORTABLE CHEST 1 VIEW COMPARISON:  None. FINDINGS: The heart size and mediastinal contours are within normal limits. Both lungs are clear. The visualized skeletal structures are unremarkable. IMPRESSION: No active disease. Electronically Signed   By: Deatra Robinson M.D.   On: 10/24/2020 21:48    Procedures .Critical Care Performed by: Cristina Gong, PA-C Authorized by: Cristina Gong, PA-C   Critical care provider statement:    Critical care time (minutes):  45   Critical care time was exclusive of:  Teaching time and separately billable procedures and treating other patients   Critical care was necessary to treat or prevent imminent or life-threatening deterioration of the following conditions:  Endocrine crisis and sepsis   Critical care was time spent personally by me on the following activities:  Discussions with consultants, evaluation of patient's response to treatment, examination of patient, ordering and performing treatments and  interventions, ordering and review of laboratory studies, ordering and review of radiographic studies, pulse oximetry, re-evaluation of patient's condition, obtaining history from patient or surrogate and review of old charts   (including critical care time)  Medications Ordered in ED Medications  insulin regular, human (MYXREDLIN) 100 units/ 100 mL infusion (6.5 Units/hr Intravenous Rate/Dose Change 10/24/20 2257)  lactated ringers infusion ( Intravenous  New Bag/Given 10/24/20 2312)  dextrose 5 % in lactated ringers infusion (has no administration in time range)  dextrose 50 % solution 0-50 mL (has no administration in time range)  potassium chloride 10 mEq in 100 mL IVPB (10 mEq Intravenous New Bag/Given 10/24/20 2302)  lactated ringers infusion (has no administration in time range)  cefTRIAXone (ROCEPHIN) 1 g in sodium chloride 0.9 % 100 mL IVPB (has no administration in time range)  lactated ringers bolus 1,000 mL (0 mLs Intravenous Stopped 10/24/20 2256)  lactated ringers bolus 1,000 mL (0 mLs Intravenous Stopped 10/24/20 2257)  metoCLOPramide (REGLAN) injection 10 mg (10 mg Intravenous Given 10/24/20 2248)  diphenhydrAMINE (BENADRYL) injection 25 mg (25 mg Intravenous Given 10/24/20 2249)  iohexol (OMNIPAQUE) 300 MG/ML solution 100 mL (100 mLs Intravenous Contrast Given 10/24/20 2221)    ED Course  I have reviewed the triage vital signs and the nursing notes.  Pertinent labs & imaging results that were available during my care of the patient were reviewed by me and considered in my medical decision making (see chart for details).  Clinical Course as of Oct 25 2323  Thu Oct 24, 2020  2114 Given that patient has a significant base deficit with a bicarb of 14.9 on VBG and a PCO2 of only 26.5 with acid base deficit of 8.7 after discussions with Dr. Pilar Plate will start patient on IV insulin.   [EH]    Clinical Course User Index [EH] Cristina Gong, PA-C   MDM Rules/Calculators/A&P                          Dossie Arbour Marenda Accardi is a 34 year old Arabic speaking woman who presents today for evaluation of fatigue and emesis. She has type 1 diabetes and has been unable to reportedly tolerate p.o. intake for the past 3 days. Prior to her fatigue and emesis she had diarrhea and cough. Additionally she reports fevers.  CBC without leukocytosis, anemia or other acute abnormalities.  CMP shows hyperglycemia with a sugar of 374.  She has low CO2 at 18 with an anion gap of 17. Her VBG shows PCO2 of 26 with acid base deficit of 8.7 and a bicarb of 14.9 raising concern for acid-base abnormalities.  She is treated with Reglan and Benadryl for her nausea and vomiting.  She is given 2 L of IV fluids after which she felt slightly better.  Due to her increased anion gap she is given IV insulin.    She does report dysuria, her UDS is negative, UA shows 6-10 red cells, 6-10 white cells and rare bacteria with over 500 glucose. CT abdomen pelvis is obtained showing concern for right-sided pyelonephritis which fits with her back pain, and overall symptoms.  Her beta hydroxybutyric acid is elevated at 4.  Given concern for DKA in addition to concern for pyelonephritis and vomiting patient will require admission into the hospital.  Here Covid Covid test is negative.  I spoke with Dr. Julian Reil who will see patient for admission.  All interactions with patient performed through professional Arabic speaking medical interpreter.  Note: Portions of this report may have been transcribed using voice recognition software. Every effort was made to ensure accuracy; however, inadvertent computerized transcription errors may be present   Final Clinical Impression(s) / ED Diagnoses Final diagnoses:  Pyelonephritis  Sepsis without acute organ dysfunction, due to unspecified organism [facility])  Type 1 diabetes mellitus with ketoacidosis without coma (HCC)    Rx /  DC Orders ED Discharge Orders    None        Norman ClayHammond, Hermina Barnard W, PA-C 10/24/20 2324    Sabas SousBero, Michael M, MD 10/28/20 [phone]

## 2020-10-24 NOTE — ED Triage Notes (Signed)
Pts family member reports pt has been vomiting x3 days. Pt is very lethargic and currently vomiting.

## 2020-10-24 NOTE — H&P (Signed)
History and Physical    Kristin Clay BMW:[id number] DOB: [birth year] DOA: 10/24/2020  PCP: Patient, No Pcp Per  Patient coming from: Home  I have personally briefly reviewed patient's old medical records in [facility]  Chief Complaint: emesis, flank pain  HPI: Kristin Clay is a 34 y.o. female with medical history significant of DM1.  Pt with vomiting and poor PO intake for past 3 days.  Dysuria, R flank and back pain.  abd pain.  Symptoms persistent and worsening.  Symptoms severe.  No known sick contacts.  Reduced insulin for past 3 days due to poor PO intake.  No CP.   ED Course: COVID neg.  Has mild DKA.  Has pyelonephritis of R kidney on CT abd/pelvis.   Review of Systems: As per HPI, otherwise all review of systems negative.  Past Medical History:  Diagnosis Date  . Diabetes mellitus type 1 (HCC)     Past Surgical History:  Procedure Laterality Date  . FOOT SURGERY    . HAND SURGERY       reports that she has never smoked. She does not have any smokeless tobacco history on file. She reports that she does not drink alcohol and does not use drugs.  No Known Allergies  Family History  Family history unknown: Yes     Prior to Admission medications   Not on File    Physical Exam: Vitals:   10/24/20 2053 10/24/20 2100 10/24/20 2130 10/24/20 2200  BP:  (!) 152/102 (!) 154/96 134/79  Pulse:  (!) 108 (!) 106 (!) 104  Resp:  19 (!) 27 (!) 21  Temp: 99.4 F (37.4 C)     TempSrc: Rectal     SpO2:  100% 100% 100%  Weight:      Height:        Constitutional: Ill appearing Eyes: PERRL, lids and conjunctivae normal ENMT: Mucous membranes are moist. Posterior pharynx clear of any exudate or lesions.Normal dentition.  Neck: normal, supple, no masses, no thyromegaly Respiratory: clear to auscultation bilaterally, no wheezing, no crackles. Normal respiratory effort. No accessory muscle use.  Cardiovascular: Tachycardia,  no murmurs  / rubs / gallops. No extremity edema. 2+ pedal pulses. No carotid bruits.  Abdomen: Diffuse abd TTP. Musculoskeletal: no clubbing / cyanosis. No joint deformity upper and lower extremities. Good ROM, no contractures. Normal muscle tone.  Skin: no rashes, lesions, ulcers. No induration Neurologic: CN 2-12 grossly intact. Sensation intact, DTR normal. Strength 5/5 in all 4.  Psychiatric: Normal judgment and insight. Alert and oriented x 3. Normal mood.    Labs on Admission: I have personally reviewed following labs and imaging studies  CBC: Recent Labs  Lab 10/24/20 1954 10/24/20 2036  WBC 6.4  --   NEUTROABS 5.7  --   HGB 14.9 15.6*  HCT 45.5 46.0  MCV 84.9  --   PLT 231  --    Basic Metabolic Panel: Recent Labs  Lab 10/24/20 1954 10/24/20 2036  NA 136 139  K 4.2 3.8  CL 101 104  CO2 18*  --   GLUCOSE 374* 397*  BUN 14 13  CREATININE 0.64 0.20*  CALCIUM 9.3  --    GFR: Estimated Creatinine Clearance: 81.2 mL/min (A) (by C-G formula based on SCr of 0.2 mg/dL (L)). Liver Function Tests: Recent Labs  Lab 10/24/20 1954  AST 14*  ALT 12  ALKPHOS 92  BILITOT 1.0  PROT 8.5*  ALBUMIN 3.8   Recent Labs  Lab 10/24/20 1954  LIPASE 19   No results for input(s): AMMONIA in the last 168 hours. Coagulation Profile: No results for input(s): INR, PROTIME in the last 168 hours. Cardiac Enzymes: No results for input(s): CKTOTAL, CKMB, CKMBINDEX, TROPONINI in the last 168 hours. BNP (last 3 results) No results for input(s): PROBNP in the last 8760 hours. HbA1C: No results for input(s): HGBA1C in the last 72 hours. CBG: Recent Labs  Lab 10/24/20 2049 10/24/20 2146 10/24/20 2256  GLUCAP 343* 307* 252*   Lipid Profile: No results for input(s): CHOL, HDL, LDLCALC, TRIG, CHOLHDL, LDLDIRECT in the last 72 hours. Thyroid Function Tests: No results for input(s): TSH, T4TOTAL, FREET4, T3FREE, THYROIDAB in the last 72 hours. Anemia Panel: No results for input(s):  VITAMINB12, FOLATE, FERRITIN, TIBC, IRON, RETICCTPCT in the last 72 hours. Urine analysis:    Component Value Date/Time   COLORURINE YELLOW 10/24/2020 1954   APPEARANCEUR CLEAR 10/24/2020 1954   LABSPEC 1.027 10/24/2020 1954   PHURINE 5.0 10/24/2020 1954   GLUCOSEU >=500 (A) 10/24/2020 1954   HGBUR MODERATE (A) 10/24/2020 1954   BILIRUBINUR NEGATIVE 10/24/2020 1954   KETONESUR 80 (A) 10/24/2020 1954   PROTEINUR 100 (A) 10/24/2020 1954   NITRITE NEGATIVE 10/24/2020 1954   LEUKOCYTESUR SMALL (A) 10/24/2020 1954    Radiological Exams on Admission: CT Abdomen Pelvis W Contrast  Result Date: 10/24/2020 CLINICAL DATA:  34 year old female with abdominal pain, nausea vomiting. EXAM: CT ABDOMEN AND PELVIS WITH CONTRAST TECHNIQUE: Multidetector CT imaging of the abdomen and pelvis was performed using the standard protocol following bolus administration of intravenous contrast. CONTRAST:  OMNIPAQUE IOHEXOL 300 MG/ML  SOLN COMPARISON:  None. FINDINGS: Lower chest: The visualized lung bases are clear. No intra-abdominal free air or free fluid. Hepatobiliary: No focal liver abnormality is seen. No gallstones, gallbladder wall thickening, or biliary dilatation. Pancreas: Unremarkable. No pancreatic ductal dilatation or surrounding inflammatory changes. Spleen: Normal in size without focal abnormality. Adrenals/Urinary Tract: The adrenal glands unremarkable. There is heterogeneous enhancement of the right renal parenchyma most consistent with pyelonephritis. Correlation with urinalysis recommended. No drainable fluid collection or abscess. The left kidney is unremarkable. There is no hydronephrosis on either side. The visualized ureters and urinary bladder appear unremarkable. Stomach/Bowel: There is no bowel obstruction or active inflammation. The appendix is normal. Vascular/Lymphatic: The abdominal aorta and IVC unremarkable. No portal venous gas. There is no adenopathy. Reproductive: The uterus and  ovaries are grossly unremarkable. No pelvic mass. Other: None Musculoskeletal: No acute or significant osseous findings. IMPRESSION: 1. Right-sided pyelonephritis. Correlation with urinalysis recommended. No drainable fluid collection or abscess. 2. No bowel obstruction. Normal appendix. Electronically Signed   By: Elgie Collard M.D.   On: 10/24/2020 22:39   DG Chest Portable 1 View  Result Date: 10/24/2020 CLINICAL DATA:  Cough EXAM: PORTABLE CHEST 1 VIEW COMPARISON:  None. FINDINGS: The heart size and mediastinal contours are within normal limits. Both lungs are clear. The visualized skeletal structures are unremarkable. IMPRESSION: No active disease. Electronically Signed   By: Deatra Robinson M.D.   On: 10/24/2020 21:48    EKG: Independently reviewed.  Assessment/Plan Principal Problem:   DKA, type 1 (HCC) Active Problems:   Acute pyelonephritis    1. DKA1 - 1. DKA pathway 2. Insulin gtt 3. IVF per pathway 4. BMP Q4H 5. BHB pending 6. Has 80 keytones in urine 7. AG 17 initially 2. Acute pyelo - 1. Rocephin 2. zofran PRN N/V  DVT prophylaxis: Lovenox Code Status: Full Family  Communication: No Family in room Disposition Plan: Home after DKA resolved and pyelonephritis improved Consults called: None Admission status: Admit to inpatient  Severity of Illness: The appropriate patient status for this patient is INPATIENT. Inpatient status is judged to be reasonable and necessary in order to provide the required intensity of service to ensure the patient's safety. The patient's presenting symptoms, physical exam findings, and initial radiographic and laboratory data in the context of their chronic comorbidities is felt to place them at high risk for further clinical deterioration. Furthermore, it is not anticipated that the patient will be medically stable for discharge from the hospital within 2 midnights of admission. The following factors support the patient status of inpatient.    IP status for treatment of DKA.   * I certify that at the point of admission it is my clinical judgment that the patient will require inpatient hospital care spanning beyond 2 midnights from the point of admission due to high intensity of service, high risk for further deterioration and high frequency of surveillance required.*    Doron Shake M. DO [facility]  How to contact the [facility] Attending or Consulting provider 7A - 7P or covering provider during after hours 7P -7A, for this patient?  1. Check the care team in [facility] and look for a) attending/consulting [facility] provider listed and b) the [facility] team listed 2. Log into [email or web address]  [employer] Physician Scheduling and messaging for groups and whole hospitals  On call and physician scheduling software for group practices, residents, hospitalists and other medical providers for call, clinic, rotation and shift schedules. On[employer] is a hospital-wide system for scheduling doctors and paging doctors on call. EasyPlot is for scientific plotting and data analysis.  [email or web address]  and use [facility]'s universal password to access. If you do not have the password, please contact the hospital operator.  3. Locate the [facility] provider you are looking for under [facility] and page to a number that you can be directly reached. 4. If you still have difficulty reaching the provider, please page the [facility] (Director on Call) for the Hospitalists listed on amion for assistance.  10/24/2020, 11:28 PM

## 2020-10-24 NOTE — ED Notes (Signed)
Rectal temp was 99.4 

## 2020-10-25 ENCOUNTER — Encounter (HOSPITAL_COMMUNITY): Payer: Self-pay

## 2020-10-25 DIAGNOSIS — E1010 Type 1 diabetes mellitus with ketoacidosis without coma: Secondary | ICD-10-CM | POA: Diagnosis not present

## 2020-10-25 DIAGNOSIS — N10 Acute tubulo-interstitial nephritis: Secondary | ICD-10-CM | POA: Diagnosis not present

## 2020-10-25 LAB — BASIC METABOLIC PANEL
Anion gap: 10 (ref 5–15)
Anion gap: 11 (ref 5–15)
Anion gap: 12 (ref 5–15)
Anion gap: 15 (ref 5–15)
Anion gap: 16 — ABNORMAL HIGH (ref 5–15)
Anion gap: 9 (ref 5–15)
BUN: 11 mg/dL (ref 6–20)
BUN: 6 mg/dL (ref 6–20)
BUN: 6 mg/dL (ref 6–20)
BUN: 8 mg/dL (ref 6–20)
BUN: 8 mg/dL (ref 6–20)
BUN: <5 mg/dL — ABNORMAL LOW (ref 6–20)
CO2: 17 mmol/L — ABNORMAL LOW (ref 22–32)
CO2: 18 mmol/L — ABNORMAL LOW (ref 22–32)
CO2: 21 mmol/L — ABNORMAL LOW (ref 22–32)
CO2: 22 mmol/L (ref 22–32)
CO2: 22 mmol/L (ref 22–32)
CO2: 24 mmol/L (ref 22–32)
Calcium: 8.3 mg/dL — ABNORMAL LOW (ref 8.9–10.3)
Calcium: 8.4 mg/dL — ABNORMAL LOW (ref 8.9–10.3)
Calcium: 8.4 mg/dL — ABNORMAL LOW (ref 8.9–10.3)
Calcium: 8.5 mg/dL — ABNORMAL LOW (ref 8.9–10.3)
Calcium: 8.5 mg/dL — ABNORMAL LOW (ref 8.9–10.3)
Calcium: 8.7 mg/dL — ABNORMAL LOW (ref 8.9–10.3)
Chloride: 102 mmol/L (ref 98–111)
Chloride: 105 mmol/L (ref 98–111)
Chloride: 105 mmol/L (ref 98–111)
Chloride: 105 mmol/L (ref 98–111)
Chloride: 105 mmol/L (ref 98–111)
Chloride: 106 mmol/L (ref 98–111)
Creatinine, Ser: 0.3 mg/dL — ABNORMAL LOW (ref 0.44–1.00)
Creatinine, Ser: 0.35 mg/dL — ABNORMAL LOW (ref 0.44–1.00)
Creatinine, Ser: 0.39 mg/dL — ABNORMAL LOW (ref 0.44–1.00)
Creatinine, Ser: 0.41 mg/dL — ABNORMAL LOW (ref 0.44–1.00)
Creatinine, Ser: 0.48 mg/dL (ref 0.44–1.00)
Creatinine, Ser: 0.49 mg/dL (ref 0.44–1.00)
GFR, Estimated: >60 mL/min (ref >60)
GFR, Estimated: >60 mL/min (ref >60)
GFR, Estimated: >60 mL/min (ref >60)
GFR, Estimated: >60 mL/min (ref >60)
GFR, Estimated: >60 mL/min (ref >60)
GFR, Estimated: >60 mL/min (ref >60)
Glucose, Bld: 179 mg/dL — ABNORMAL HIGH (ref 70–99)
Glucose, Bld: 184 mg/dL — ABNORMAL HIGH (ref 70–99)
Glucose, Bld: 201 mg/dL — ABNORMAL HIGH (ref 70–99)
Glucose, Bld: 226 mg/dL — ABNORMAL HIGH (ref 70–99)
Glucose, Bld: 231 mg/dL — ABNORMAL HIGH (ref 70–99)
Glucose, Bld: 248 mg/dL — ABNORMAL HIGH (ref 70–99)
Potassium: 3.1 mmol/L — ABNORMAL LOW (ref 3.5–5.1)
Potassium: 3.1 mmol/L — ABNORMAL LOW (ref 3.5–5.1)
Potassium: 3.3 mmol/L — ABNORMAL LOW (ref 3.5–5.1)
Potassium: 3.4 mmol/L — ABNORMAL LOW (ref 3.5–5.1)
Potassium: 3.6 mmol/L (ref 3.5–5.1)
Potassium: 3.6 mmol/L (ref 3.5–5.1)
Sodium: 135 mmol/L (ref 135–145)
Sodium: 137 mmol/L (ref 135–145)
Sodium: 138 mmol/L (ref 135–145)
Sodium: 138 mmol/L (ref 135–145)
Sodium: 138 mmol/L (ref 135–145)
Sodium: 139 mmol/L (ref 135–145)

## 2020-10-25 LAB — GLUCOSE, CAPILLARY
Glucose-Capillary: 178 mg/dL — ABNORMAL HIGH (ref 70–99)
Glucose-Capillary: 219 mg/dL — ABNORMAL HIGH (ref 70–99)
Glucose-Capillary: 246 mg/dL — ABNORMAL HIGH (ref 70–99)
Glucose-Capillary: 253 mg/dL — ABNORMAL HIGH (ref 70–99)
Glucose-Capillary: 163 mg/dL — ABNORMAL HIGH (ref 70–99)
Glucose-Capillary: 167 mg/dL — ABNORMAL HIGH (ref 70–99)
Glucose-Capillary: 176 mg/dL — ABNORMAL HIGH (ref 70–99)
Glucose-Capillary: 168 mg/dL — ABNORMAL HIGH (ref 70–99)
Glucose-Capillary: 168 mg/dL — ABNORMAL HIGH (ref 70–99)
Glucose-Capillary: 179 mg/dL — ABNORMAL HIGH (ref 70–99)
Glucose-Capillary: 180 mg/dL — ABNORMAL HIGH (ref 70–99)
Glucose-Capillary: 151 mg/dL — ABNORMAL HIGH (ref 70–99)
Glucose-Capillary: 206 mg/dL — ABNORMAL HIGH (ref 70–99)
Glucose-Capillary: 196 mg/dL — ABNORMAL HIGH (ref 70–99)
Glucose-Capillary: 188 mg/dL — ABNORMAL HIGH (ref 70–99)
Glucose-Capillary: 164 mg/dL — ABNORMAL HIGH (ref 70–99)
Glucose-Capillary: 166 mg/dL — ABNORMAL HIGH (ref 70–99)
Glucose-Capillary: 182 mg/dL — ABNORMAL HIGH (ref 70–99)
Glucose-Capillary: 145 mg/dL — ABNORMAL HIGH (ref 70–99)
Glucose-Capillary: 159 mg/dL — ABNORMAL HIGH (ref 70–99)

## 2020-10-25 LAB — BETA-HYDROXYBUTYRIC ACID
Beta-Hydroxybutyric Acid: 3.44 mmol/L — ABNORMAL HIGH (ref 0.05–0.27)
Beta-Hydroxybutyric Acid: 0.11 mmol/L (ref 0.05–0.27)

## 2020-10-25 LAB — HEMOGLOBIN A1C
Hgb A1c MFr Bld: 11.9 % — ABNORMAL HIGH (ref 4.8–5.6)
Mean Plasma Glucose: 294.83 mg/dL

## 2020-10-25 LAB — MAGNESIUM: Magnesium: 1.7 mg/dL (ref 1.7–2.4)

## 2020-10-25 LAB — HIV ANTIBODY (ROUTINE TESTING W REFLEX): HIV Screen 4th Generation wRfx: NONREACTIVE

## 2020-10-25 LAB — LACTIC ACID, PLASMA: Lactic Acid, Venous: 1.6 mmol/L (ref 0.5–1.9)

## 2020-10-25 LAB — CBG MONITORING, ED
Glucose-Capillary: 136 mg/dL — ABNORMAL HIGH (ref 70–99)
Glucose-Capillary: 162 mg/dL — ABNORMAL HIGH (ref 70–99)

## 2020-10-25 LAB — PHOSPHORUS: Phosphorus: 2.1 mg/dL — ABNORMAL LOW (ref 2.5–4.6)

## 2020-10-25 LAB — MRSA PCR SCREENING: MRSA by PCR: NEGATIVE

## 2020-10-25 MED ORDER — MAGNESIUM OXIDE 400 (241.3 MG) MG PO TABS
400.0000 mg | ORAL_TABLET | Freq: Two times a day (BID) | ORAL | Status: DC
  Administered 2020-10-25 – 2020-10-28 (×6): 400 mg via ORAL
  Filled 2020-10-25 (×6): qty 1

## 2020-10-25 MED ORDER — POTASSIUM CHLORIDE CRYS ER 20 MEQ PO TBCR
40.0000 meq | EXTENDED_RELEASE_TABLET | Freq: Two times a day (BID) | ORAL | Status: AC
  Administered 2020-10-25 – 2020-10-26 (×3): 40 meq via ORAL
  Filled 2020-10-25 (×4): qty 2

## 2020-10-25 MED ORDER — ONDANSETRON HCL 4 MG/2ML IJ SOLN
4.0000 mg | Freq: Four times a day (QID) | INTRAMUSCULAR | Status: DC | PRN
  Administered 2020-10-25 – 2020-10-26 (×4): 4 mg via INTRAVENOUS
  Filled 2020-10-25 (×4): qty 2

## 2020-10-25 MED ORDER — PROCHLORPERAZINE EDISYLATE 10 MG/2ML IJ SOLN
10.0000 mg | Freq: Four times a day (QID) | INTRAMUSCULAR | Status: DC | PRN
  Administered 2020-10-25 – 2020-10-26 (×4): 10 mg via INTRAVENOUS
  Filled 2020-10-25 (×4): qty 2

## 2020-10-25 MED ORDER — POTASSIUM PHOSPHATES 15 MMOLE/5ML IV SOLN
20.0000 mmol | Freq: Once | INTRAVENOUS | Status: AC
  Administered 2020-10-25: 20 mmol via INTRAVENOUS
  Filled 2020-10-25: qty 6.67

## 2020-10-25 MED ORDER — CHLORHEXIDINE GLUCONATE CLOTH 2 % EX PADS
6.0000 | MEDICATED_PAD | Freq: Every day | CUTANEOUS | Status: DC
  Administered 2020-10-25 – 2020-10-27 (×3): 6 via TOPICAL

## 2020-10-25 MED ORDER — LABETALOL HCL 5 MG/ML IV SOLN
10.0000 mg | Freq: Once | INTRAVENOUS | Status: AC
  Administered 2020-10-25: 10 mg via INTRAVENOUS
  Filled 2020-10-25: qty 4

## 2020-10-25 MED ORDER — ENALAPRILAT 1.25 MG/ML IV SOLN
0.6250 mg | Freq: Once | INTRAVENOUS | Status: AC
  Administered 2020-10-25: 0.625 mg via INTRAVENOUS
  Filled 2020-10-25: qty 1

## 2020-10-25 MED ORDER — HYDRALAZINE HCL 25 MG PO TABS
25.0000 mg | ORAL_TABLET | Freq: Four times a day (QID) | ORAL | Status: DC | PRN
  Administered 2020-10-25: 25 mg via ORAL
  Filled 2020-10-25: qty 1

## 2020-10-25 NOTE — Progress Notes (Signed)
Inpatient Diabetes Program Recommendations  AACE/ADA: New Consensus Statement on Inpatient Glycemic Control (2015)  Target Ranges:  Prepandial:   less than 140 mg/dL      Peak postprandial:   less than 180 mg/dL (1-2 hours)      Critically ill patients:  140 - 180 mg/dL   Lab Results  Component Value Date   GLUCAP 151 (H) 10/25/2020   HGBA1C 11.9 (H) 10/25/2020    Review of Glycemic Control  Diabetes history: DM1 Outpatient Diabetes medications: Novolin 70/30 28 units in am and 14 units QPM Current orders for Inpatient glycemic control: IV insulin per [employer] for DKA  HgbA1C - 11.9% - poor glycemic control DKA is cleared.  Inpatient Diabetes Program Recommendations:    Lantus 14 units Q24H (half home basal dose) Novolog 0-9 units tidwc and hs Novolog 4 units tidwc for meal coverage insulin if pt eats > 50% meal.  Speak with pt/family about HgbA1C of 11.9%. Make certain pt has access to meter and supplies. Will need PCP to manage her diabetes.  Thank you. Ailene Ards, RD, LDN, CDE Inpatient Diabetes Coordinator [phone]

## 2020-10-25 NOTE — TOC Initial Note (Signed)
Transition of Care [facility]) - Initial/Assessment Note    Patient Details  Name: Kristin Clay MRN: [id number] Date of Birth: March 03, 1986  [facility]) CM/SW Contact:    Golda Acre, RN Phone Number: 10/25/2020, 8:18 AM  Clinical Narrative:                 34 y.o. female with medical history significant of DM1.  Pt with vomiting and poor PO intake for past 3 days.  Dysuria, R flank and back pain.  abd pain.  Symptoms persistent and worsening.  Symptoms severe.  No known sick contacts.  Reduced insulin for past 3 days due to poor PO intake.  No CP.   ED Course: COVID neg.  Has mild DKA.  Has pyelonephritis of R kidney on CT abd/pelvis. PLAN: to return to home with husband Progression: room air, iv rocephin, d5lr at 125cc/hr,iv lr at 125cc/hr, iv insulin, BHA=3.44, Anion Gap is closed at 12. bld glucose 231 Expected Discharge Plan: Home/Self Care Barriers to Discharge: No Barriers Identified   Patient Goals and CMS Choice Patient states their goals for this hospitalization and ongoing recovery are:: to go home CMS Medicare.gov Compare Post Acute Care list provided to:: Patient    Expected Discharge Plan and Services Expected Discharge Plan: Home/Self Care   Discharge Planning Services: CM Consult   Living arrangements for the past 2 months: Apartment                                      Prior Living Arrangements/Services Living arrangements for the past 2 months: Apartment Lives with:: Spouse Patient language and need for interpreter reviewed:: Yes Do you feel safe going back to the place where you live?: Yes      Need for Family Participation in Patient Care: Yes (Comment) Care giver support system in place?: Yes (comment)   Criminal Activity/Legal Involvement Pertinent to Current Situation/Hospitalization: No - Comment as needed  Activities of Daily Living      Permission Sought/Granted                  Emotional  Assessment Appearance:: Appears stated age Attitude/Demeanor/Rapport: Engaged Affect (typically observed): Calm Orientation: : Oriented to  Time, Oriented to Situation, Oriented to Place, Oriented to Self Alcohol / Substance Use: Not Applicable Psych Involvement: No (comment)  Admission diagnosis:  Pyelonephritis [N12] DKA, type 1 (HCC) [E10.10] Type 1 diabetes mellitus with ketoacidosis without coma (HCC) [E10.10] Sepsis without acute organ dysfunction, due to unspecified organism [facility]) [A41.9] Patient Active Problem List   Diagnosis Date Noted  . DKA, type 1 (HCC) 10/24/2020  . Acute pyelonephritis 10/24/2020   PCP:  Patient, No Pcp Per Pharmacy:   [id number] - [place], [place] - [address] AT [facility] [address] [place] [address] Phone: [phone] Fax: [phone]     Social Determinants of Health (SDOH) Interventions    Readmission Risk Interventions No flowsheet data found.

## 2020-10-25 NOTE — Progress Notes (Signed)
PROGRESS NOTE    Analy Clay  [id number]:[id number] DOB: 1986-03-19 DOA: 10/24/2020 PCP: Patient, No Pcp Per    Brief Narrative:  34 year old female with history of type 1 diabetes on Novolin 70/30 at home that she takes over-the-counter, no maintenance health care presents to the ER with about 1 week of not feeling well, 3 days of nausea vomiting.  In the emergency room found to have active nausea, abnormal urine, right-sided pyelonephritis and DKA.   Assessment & Plan:   Principal Problem:   DKA, type 1 (HCC) Active Problems:   Acute pyelonephritis  DKA in a patient with type 1 diabetes, probably aggravated by infection. Patient remains on insulin drip, anion gap closed but patient is still nauseated. Continue insulin infusion, change to dextrose if blood sugars less than 250.  Will challenge with clear liquid diet, if she is able to tolerate regular diet by afternoon, will resume subcu insulin. Hemoglobin A1c is 11. Patient does not have any healthcare coverage or maintenance health care.  She takes over-the-counter Novolin from [facility]. We will titrate her doses while she is in the hospital.  Case management to help with setting up PCP follow-up.  Hypokalemia: Replace and monitor levels.  Check magnesium and phosphorus.  Acute pyelonephritis: Currently on Rocephin.  No evidence of abscess.  Continue Rocephin until final cultures.   DVT prophylaxis: enoxaparin (LOVENOX) injection 40 mg Start: 10/24/20 2330   Code Status: Full code Family Communication: None Disposition Plan: Status is: Inpatient  Remains inpatient appropriate because:Inpatient level of care appropriate due to severity of illness   Dispo: The patient is from: Home              Anticipated d/c is to: Home              Anticipated d/c date is: 2 days              Patient currently is not medically stable to d/c.         Consultants:   None  Procedures:   None  Antimicrobials:    Rocephin, 12/2---   Subjective: Patient seen and examined.  No overnight events.  Still has some nausea but feels hungry and wants to try some liquids.  Blood sugars are already well controlled.  Has diffuse abdominal pain but that is very mild as compared to her admission. She is a scheduled to travel to [place] tomorrow and was nervous about not making it. Afebrile overnight.  Objective: Vitals:   10/25/20 0700 10/25/20 0800 10/25/20 0900 10/25/20 1020  BP: 140/66 (!) 145/78 125/77 (!) 146/76  Pulse: 98 95 99 94  Resp: (!) 21 19 17 18   Temp:  98.1 F (36.7 C)    TempSrc:  Oral    SpO2: 100% 100% 100% 100%  Weight:      Height:        Intake/Output Summary (Last 24 hours) at 10/25/2020 1125 Last data filed at 10/25/2020 0800 Gross per 24 hour  Intake 3189.99 ml  Output 200 ml  Net 2989.99 ml   Filed Weights   10/24/20 2032  Weight: 61.2 kg    Examination:  General exam: Appears calm and comfortable  She is fairly comfortably sitting.  She has some nausea and dry cough.  Slightly anxious. Respiratory system: Clear to auscultation. Respiratory effort normal. Cardiovascular system: S1 & S2 heard, RRR. No JVD, murmurs, rubs, gallops or clicks. No pedal edema. Gastrointestinal system: Abdomen is nondistended, soft and nontender. No  organomegaly or masses felt. Normal bowel sounds heard. Central nervous system: Alert and oriented. No focal neurological deficits. Extremities: Symmetric 5 x 5 power. Skin: No rashes, lesions or ulcers Psychiatry: Judgement and insight appear normal. Mood & affect appropriate.     Data Reviewed: I have personally reviewed following labs and imaging studies  CBC: Recent Labs  Lab 10/24/20 1954 10/24/20 2036  WBC 6.4  --   NEUTROABS 5.7  --   HGB 14.9 15.6*  HCT 45.5 46.0  MCV 84.9  --   PLT 231  --    Basic Metabolic Panel: Recent Labs  Lab 10/24/20 1954 10/24/20 2036 10/24/20 2345 10/25/20 0301 10/25/20 0921  NA 136 139  135 138  138 139  K 4.2 3.8 3.4* 3.6  3.6 3.1*  CL 101 104 102 105  105 106  CO2 18*  --  18* 21*  17* 22  GLUCOSE 374* 397* 248* 231*  226* 179*  BUN 14 13 11 8  8 6   CREATININE 0.64 0.20* 0.48 0.49  0.41* 0.30*  CALCIUM 9.3  --  8.7* 8.5*  8.5* 8.3*   GFR: Estimated Creatinine Clearance: 81.2 mL/min (A) (by C-G formula based on SCr of 0.3 mg/dL (L)). Liver Function Tests: Recent Labs  Lab 10/24/20 1954  AST 14*  ALT 12  ALKPHOS 92  BILITOT 1.0  PROT 8.5*  ALBUMIN 3.8   Recent Labs  Lab 10/24/20 1954  LIPASE 19   No results for input(s): AMMONIA in the last 168 hours. Coagulation Profile: No results for input(s): INR, PROTIME in the last 168 hours. Cardiac Enzymes: No results for input(s): CKTOTAL, CKMB, CKMBINDEX, TROPONINI in the last 168 hours. BNP (last 3 results) No results for input(s): PROBNP in the last 8760 hours. HbA1C: Recent Labs    10/25/20 0301  HGBA1C 11.9*   CBG: Recent Labs  Lab 10/25/20 0624 10/25/20 0740 10/25/20 0846 10/25/20 1000 10/25/20 1040  GLUCAP 163* 167* 176* 168* 168*   Lipid Profile: No results for input(s): CHOL, HDL, LDLCALC, TRIG, CHOLHDL, LDLDIRECT in the last 72 hours. Thyroid Function Tests: No results for input(s): TSH, T4TOTAL, FREET4, T3FREE, THYROIDAB in the last 72 hours. Anemia Panel: No results for input(s): VITAMINB12, FOLATE, FERRITIN, TIBC, IRON, RETICCTPCT in the last 72 hours. Sepsis Labs: Recent Labs  Lab 10/24/20 1954 10/24/20 2324  LATICACIDVEN 1.4 1.6    Recent Results (from the past 240 hour(s))  Culture, blood (routine x 2)     Status: None (Preliminary result)   Collection Time: 10/24/20  7:54 PM   Specimen: BLOOD  Result Value Ref Range Status   Specimen Description   Final    BLOOD RIGHT ANTECUBITAL Performed at [facility], [address], [place], [place]    Special Requests   Final    BOTTLES DRAWN AEROBIC AND ANAEROBIC Blood Culture adequate  volume Performed at [facility], [address], [place], [place]    Culture   Final    NO GROWTH < 12 HOURS Performed at [facility], [address] [place], [place]    Report Status PENDING  Incomplete  Culture, blood (routine x 2)     Status: None (Preliminary result)   Collection Time: 10/24/20  7:54 PM   Specimen: BLOOD  Result Value Ref Range Status   Specimen Description   Final    BLOOD LEFT ANTECUBITAL Performed at [facility], [address] [place],  [place] [address]    Special Requests   Final    BOTTLES DRAWN AEROBIC AND ANAEROBIC Blood Culture adequate volume Performed at [facility], [address] [place], [place] [address]    Culture   Final    NO GROWTH < 12 HOURS Performed at [facility], [address] [place], [place] [address]    Report Status PENDING  Incomplete  Resp Panel by RT-PCR (Flu A&B, Covid) Nasopharyngeal Swab     Status: None   Collection Time: 10/24/20  7:54 PM   Specimen: Nasopharyngeal Swab; Nasopharyngeal(NP) swabs in vial transport medium  Result Value Ref Range Status   SARS Coronavirus 2 by RT PCR NEGATIVE NEGATIVE Final    Comment: (NOTE) SARS-CoV-2 target nucleic acids are NOT DETECTED.  The SARS-CoV-2 RNA is generally detectable in upper respiratory specimens during the acute phase of infection. The lowest concentration of SARS-CoV-2 viral copies this assay can detect is 138 copies/mL. A negative result does not preclude SARS-Cov-2 infection and should not be used as the sole basis for treatment or other patient management decisions. A negative result may occur with  improper specimen collection/handling, submission of specimen other than nasopharyngeal swab, presence of viral mutation(s) within the areas targeted by this assay, and inadequate number of viral copies(<138 copies/mL). A negative result must be combined with clinical  observations, patient history, and epidemiological information. The expected result is Negative.  Fact Sheet for Patients:  BloggerCourse.comhttps://www.fda.gov/media/152166/download  Fact Sheet for Healthcare Providers:  SeriousBroker.ithttps://www.fda.gov/media/152162/download  This test is no t yet approved or cleared by the [place] FDA and  has been authorized for detection and/or diagnosis of SARS-CoV-2 by FDA under an Emergency Use Authorization (EUA). This EUA will remain  in effect (meaning this test can be used) for the duration of the COVID-19 declaration under Section 564(b)(1) of the Act, 21 U.S.C.section 360bbb-3(b)(1), unless the authorization is terminated  or revoked sooner.       Influenza A by PCR NEGATIVE NEGATIVE Final   Influenza B by PCR NEGATIVE NEGATIVE Final    Comment: (NOTE) The Xpert Xpress SARS-CoV-2/FLU/RSV plus assay is intended as an aid in the diagnosis of influenza from Nasopharyngeal swab specimens and should not be used as a sole basis for treatment. Nasal washings and aspirates are unacceptable for Xpert Xpress SARS-CoV-2/FLU/RSV testing.  Fact Sheet for Patients: BloggerCourse.comhttps://www.fda.gov/media/152166/download  Fact Sheet for Healthcare Providers: SeriousBroker.ithttps://www.fda.gov/media/152162/download  This test is not yet approved or cleared by the [place] FDA and has been authorized for detection and/or diagnosis of SARS-CoV-2 by FDA under an Emergency Use Authorization (EUA). This EUA will remain in effect (meaning this test can be used) for the duration of the COVID-19 declaration under Section 564(b)(1) of the Act, 21 U.S.C. section 360bbb-3(b)(1), unless the authorization is terminated or revoked.  Performed at [facility], [address] [place], [place] [address]   MRSA PCR Screening     Status: None   Collection Time: 10/25/20  2:17 AM   Specimen: Nasopharyngeal  Result Value Ref Range Status   MRSA by PCR NEGATIVE NEGATIVE Final     Comment:        The GeneXpert MRSA Assay (FDA approved for NASAL specimens only), is one component of a comprehensive MRSA colonization surveillance program. It is not intended to diagnose MRSA infection nor to guide or monitor treatment for MRSA infections. Performed at [facility], [address] [place], [place] [address]  Radiology Studies: CT Abdomen Pelvis W Contrast  Result Date: 10/24/2020 CLINICAL DATA:  34 year old female with abdominal pain, nausea vomiting. EXAM: CT ABDOMEN AND PELVIS WITH CONTRAST TECHNIQUE: Multidetector CT imaging of the abdomen and pelvis was performed using the standard protocol following bolus administration of intravenous contrast. CONTRAST:  OMNIPAQUE IOHEXOL 300 MG/ML  SOLN COMPARISON:  None. FINDINGS: Lower chest: The visualized lung bases are clear. No intra-abdominal free air or free fluid. Hepatobiliary: No focal liver abnormality is seen. No gallstones, gallbladder wall thickening, or biliary dilatation. Pancreas: Unremarkable. No pancreatic ductal dilatation or surrounding inflammatory changes. Spleen: Normal in size without focal abnormality. Adrenals/Urinary Tract: The adrenal glands unremarkable. There is heterogeneous enhancement of the right renal parenchyma most consistent with pyelonephritis. Correlation with urinalysis recommended. No drainable fluid collection or abscess. The left kidney is unremarkable. There is no hydronephrosis on either side. The visualized ureters and urinary bladder appear unremarkable. Stomach/Bowel: There is no bowel obstruction or active inflammation. The appendix is normal. Vascular/Lymphatic: The abdominal aorta and IVC unremarkable. No portal venous gas. There is no adenopathy. Reproductive: The uterus and ovaries are grossly unremarkable. No pelvic mass. Other: None Musculoskeletal: No acute or significant osseous findings. IMPRESSION: 1. Right-sided pyelonephritis. Correlation  with urinalysis recommended. No drainable fluid collection or abscess. 2. No bowel obstruction. Normal appendix. Electronically Signed   By: Elgie Collard M.D.   On: 10/24/2020 22:39   DG Chest Portable 1 View  Result Date: 10/24/2020 CLINICAL DATA:  Cough EXAM: PORTABLE CHEST 1 VIEW COMPARISON:  None. FINDINGS: The heart size and mediastinal contours are within normal limits. Both lungs are clear. The visualized skeletal structures are unremarkable. IMPRESSION: No active disease. Electronically Signed   By: Deatra Robinson M.D.   On: 10/24/2020 21:48        Scheduled Meds: . Chlorhexidine Gluconate Cloth  6 each Topical Daily  . enoxaparin (LOVENOX) injection  40 mg Subcutaneous Q24H  . potassium chloride  40 mEq Oral BID   Continuous Infusions: . cefTRIAXone (ROCEPHIN)  IV Stopped (10/25/20 0108)  . dextrose 5% lactated ringers 125 mL/hr at 10/25/20 0800  . insulin 3.6 mL/hr at 10/25/20 0800  . lactated ringers Stopped (10/25/20 0046)  . lactated ringers Stopped (10/25/20 0046)     LOS: 1 day    Time spent: 35 minutes    Dorcas Carrow, MD [facility] Pager [phone]

## 2020-10-26 ENCOUNTER — Inpatient Hospital Stay (HOSPITAL_COMMUNITY): Payer: 59

## 2020-10-26 DIAGNOSIS — E1010 Type 1 diabetes mellitus with ketoacidosis without coma: Secondary | ICD-10-CM | POA: Diagnosis not present

## 2020-10-26 DIAGNOSIS — N10 Acute tubulo-interstitial nephritis: Secondary | ICD-10-CM | POA: Diagnosis not present

## 2020-10-26 LAB — BASIC METABOLIC PANEL
Anion gap: 10 (ref 5–15)
BUN: <5 mg/dL — ABNORMAL LOW (ref 6–20)
CO2: 22 mmol/L (ref 22–32)
Calcium: 8.4 mg/dL — ABNORMAL LOW (ref 8.9–10.3)
Chloride: 104 mmol/L (ref 98–111)
Creatinine, Ser: 0.37 mg/dL — ABNORMAL LOW (ref 0.44–1.00)
GFR, Estimated: >60 mL/min (ref >60)
Glucose, Bld: 170 mg/dL — ABNORMAL HIGH (ref 70–99)
Potassium: 3.1 mmol/L — ABNORMAL LOW (ref 3.5–5.1)
Sodium: 136 mmol/L (ref 135–145)

## 2020-10-26 LAB — GLUCOSE, CAPILLARY
Glucose-Capillary: 134 mg/dL — ABNORMAL HIGH (ref 70–99)
Glucose-Capillary: 142 mg/dL — ABNORMAL HIGH (ref 70–99)
Glucose-Capillary: 150 mg/dL — ABNORMAL HIGH (ref 70–99)
Glucose-Capillary: 161 mg/dL — ABNORMAL HIGH (ref 70–99)
Glucose-Capillary: 168 mg/dL — ABNORMAL HIGH (ref 70–99)
Glucose-Capillary: 169 mg/dL — ABNORMAL HIGH (ref 70–99)
Glucose-Capillary: 180 mg/dL — ABNORMAL HIGH (ref 70–99)
Glucose-Capillary: 185 mg/dL — ABNORMAL HIGH (ref 70–99)
Glucose-Capillary: 186 mg/dL — ABNORMAL HIGH (ref 70–99)
Glucose-Capillary: 187 mg/dL — ABNORMAL HIGH (ref 70–99)
Glucose-Capillary: 187 mg/dL — ABNORMAL HIGH (ref 70–99)
Glucose-Capillary: 191 mg/dL — ABNORMAL HIGH (ref 70–99)
Glucose-Capillary: 201 mg/dL — ABNORMAL HIGH (ref 70–99)
Glucose-Capillary: 202 mg/dL — ABNORMAL HIGH (ref 70–99)
Glucose-Capillary: 203 mg/dL — ABNORMAL HIGH (ref 70–99)
Glucose-Capillary: 232 mg/dL — ABNORMAL HIGH (ref 70–99)
Glucose-Capillary: 235 mg/dL — ABNORMAL HIGH (ref 70–99)

## 2020-10-26 LAB — NOVEL CORONAVIRUS, NAA: SARS-CoV-2, NAA: NOT DETECTED

## 2020-10-26 LAB — SARS-COV-2, NAA 2 DAY TAT

## 2020-10-26 MED ORDER — PROMETHAZINE HCL 25 MG/ML IJ SOLN
12.5000 mg | INTRAMUSCULAR | Status: DC | PRN
  Administered 2020-10-26 – 2020-10-27 (×4): 12.5 mg via INTRAVENOUS
  Filled 2020-10-26 (×4): qty 1

## 2020-10-26 MED ORDER — POLYETHYLENE GLYCOL 3350 17 G PO PACK
17.0000 g | PACK | Freq: Every day | ORAL | Status: DC
  Administered 2020-10-26: 17 g via ORAL
  Filled 2020-10-26 (×2): qty 1

## 2020-10-26 MED ORDER — INSULIN (MYXREDLIN) INFUSION FOR HYPERTRIGLYCERIDEMIA
2.0000 [IU]/h | INTRAVENOUS | Status: DC
  Administered 2020-10-26: 2 [IU]/h via INTRAVENOUS
  Filled 2020-10-26: qty 100

## 2020-10-26 MED ORDER — ACETAMINOPHEN 325 MG PO TABS: 650.0000 mg | ORAL_TABLET | Freq: Four times a day (QID) | ORAL | Status: DC | PRN

## 2020-10-26 MED ORDER — HYDRALAZINE HCL 20 MG/ML IJ SOLN
10.0000 mg | Freq: Four times a day (QID) | INTRAMUSCULAR | Status: DC | PRN
  Administered 2020-10-26 – 2020-10-27 (×4): 10 mg via INTRAVENOUS
  Filled 2020-10-26 (×4): qty 1

## 2020-10-26 MED ORDER — SODIUM CHLORIDE 0.9 % IV BOLUS
1000.0000 mL | Freq: Once | INTRAVENOUS | Status: AC
  Administered 2020-10-27: 1000 mL via INTRAVENOUS

## 2020-10-26 NOTE — Progress Notes (Signed)
PROGRESS NOTE    Kristin Clay  YBO:[id number] DOB: 08/25/1986 DOA: 10/24/2020 PCP: Patient, No Pcp Per    Brief Narrative:  34 year old female with history of type 1 diabetes on Novolin 70/30 at home that she takes over-the-counter, no maintenance health care presents to the ER with about 1 week of not feeling well, 3 days of nausea vomiting.  In the emergency room found to have active nausea, abnormal urine, right-sided pyelonephritis and DKA.   Assessment & Plan:   Principal Problem:   DKA, type 1 (HCC) Active Problems:   Acute pyelonephritis  DKA in a patient with type 1 diabetes, probably aggravated by infection. Patient remains on insulin drip, anion gap closed but patient is still nauseated. Continue insulin infusion, change to dextrose if blood sugars less than 250. Patient did not tolerate advancement of diet, will keep on IV insulin today.  Hemoglobin A1c is 11. Patient does not have any healthcare coverage or maintenance health care.  She takes over-the-counter Novolin from [place]. We will titrate her doses while she is in the hospital.  Case management to help with setting up PCP follow-up.  Hypokalemia: Replace and monitor levels.  Check magnesium and phosphorus.  Acute pyelonephritis: Currently on Rocephin.  No evidence of abscess.  Continue Rocephin until final cultures.  Hypertension: Probably stress reaction.  Hydralazine as needed.  Persistent nausea and vomiting: Patient continues to have nausea vomiting.  No bowel movement for 7 days.  CT scan with no evidence of bowel obstruction.  Will treat with Phenergan, will start on bowel regimen.   DVT prophylaxis: enoxaparin (LOVENOX) injection 40 mg Start: 10/24/20 2330   Code Status: Full code Family Communication: None Disposition Plan: Status is: Inpatient  Remains inpatient appropriate because:Inpatient level of care appropriate due to severity of illness   Dispo: The patient is from: Home               Anticipated d/c is to: Home              Anticipated d/c date is: 2 days              Patient currently is not medically stable to d/c.  Patient is not yet able to take any oral intake.   Consultants:   None  Procedures:   None  Antimicrobials:   Rocephin, 12/2---   Subjective: Patient seen and examined.  Overnight continues to have nausea and she is not even tolerating liquids.  Anion gap closed.  Blood sugars better.  However she is not able to eat. Continues to have dull abdominal pain.  Passing flatus but she has not have any bowel movement for last 7 days.  Objective: Vitals:   10/26/20 0600 10/26/20 0700 10/26/20 0800 10/26/20 0818  BP: (!) 152/88 (!) 152/82 (!) 196/105   Pulse: (!) 115 97 (!) 115   Resp: 20 20 14    Temp:    98.3 F (36.8 C)  TempSrc:    Oral  SpO2: 100% 100% 100%   Weight:      Height:        Intake/Output Summary (Last 24 hours) at 10/26/2020 1121 Last data filed at 10/26/2020 0800 Gross per 24 hour  Intake 3130.85 ml  Output 500 ml  Net 2630.85 ml   Filed Weights   10/24/20 2032  Weight: 61.2 kg    Examination:  General exam: Appears calm and comfortable  She is anxious.  In mild distress. Respiratory system: Clear to auscultation.  Respiratory effort normal. Cardiovascular system: S1 & S2 heard, RRR. No JVD, murmurs, rubs, gallops or clicks. No pedal edema. Gastrointestinal system: Abdomen is nondistended, soft and nontender. No organomegaly or masses felt. Normal bowel sounds heard.  Mild tender. Central nervous system: Alert and oriented. No focal neurological deficits. Extremities: Symmetric 5 x 5 power. Skin: No rashes, lesions or ulcers Psychiatry: Judgement and insight appear normal. Mood & affect anxious.    Data Reviewed: I have personally reviewed following labs and imaging studies  CBC: Recent Labs  Lab 10/24/20 1954 10/24/20 2036  WBC 6.4  --   NEUTROABS 5.7  --   HGB 14.9 15.6*  HCT 45.5 46.0   MCV 84.9  --   PLT 231  --    Basic Metabolic Panel: Recent Labs  Lab 10/25/20 0301 10/25/20 0921 10/25/20 1229 10/25/20 1624 10/26/20 0912  NA 138  138 139 138 137 136  K 3.6  3.6 3.1* 3.1* 3.3* 3.1*  CL 105  105 106 105 105 104  CO2 21*  17* 22 24 22 22   GLUCOSE 231*  226* 179* 184* 201* 170*  BUN 8  8 6 6  <5* <5*  CREATININE 0.49  0.41* 0.30* 0.35* 0.39* 0.37*  CALCIUM 8.5*  8.5* 8.3* 8.4* 8.4* 8.4*  MG  --   --  1.7  --   --   PHOS  --   --  2.1*  --   --    GFR: Estimated Creatinine Clearance: 81.2 mL/min (A) (by C-G formula based on SCr of 0.37 mg/dL (L)). Liver Function Tests: Recent Labs  Lab 10/24/20 1954  AST 14*  ALT 12  ALKPHOS 92  BILITOT 1.0  PROT 8.5*  ALBUMIN 3.8   Recent Labs  Lab 10/24/20 1954  LIPASE 19   No results for input(s): AMMONIA in the last 168 hours. Coagulation Profile: No results for input(s): INR, PROTIME in the last 168 hours. Cardiac Enzymes: No results for input(s): CKTOTAL, CKMB, CKMBINDEX, TROPONINI in the last 168 hours. BNP (last 3 results) No results for input(s): PROBNP in the last 8760 hours. HbA1C: Recent Labs    10/25/20 0301  HGBA1C 11.9*   CBG: Recent Labs  Lab 10/26/20 0708 10/26/20 0806 10/26/20 0910 10/26/20 1011 10/26/20 1111  GLUCAP 191* 134* 161* 202* 203*   Lipid Profile: No results for input(s): CHOL, HDL, LDLCALC, TRIG, CHOLHDL, LDLDIRECT in the last 72 hours. Thyroid Function Tests: No results for input(s): TSH, T4TOTAL, FREET4, T3FREE, THYROIDAB in the last 72 hours. Anemia Panel: No results for input(s): VITAMINB12, FOLATE, FERRITIN, TIBC, IRON, RETICCTPCT in the last 72 hours. Sepsis Labs: Recent Labs  Lab 10/24/20 1954 10/24/20 2324  LATICACIDVEN 1.4 1.6    Recent Results (from the past 240 hour(s))  Culture, blood (routine x 2)     Status: None (Preliminary result)   Collection Time: 10/24/20  7:54 PM   Specimen: BLOOD  Result Value Ref Range Status   Specimen  Description   Final    BLOOD RIGHT ANTECUBITAL Performed at [facility], [address] [place], [place] [address]    Special Requests   Final    BOTTLES DRAWN AEROBIC AND ANAEROBIC Blood Culture adequate volume Performed at [facility], [address] [place], [place] [address]    Culture   Final    NO GROWTH 2 DAYS Performed at [facility], [address] [place], [place] [address]    Report Status PENDING  Incomplete  Culture, blood (routine x 2)     Status: None (Preliminary result)   Collection Time: 10/24/20  7:54 PM   Specimen: BLOOD  Result Value Ref Range Status   Specimen Description   Final    BLOOD LEFT ANTECUBITAL Performed at [facility], [address], [place], [place] [address]    Special Requests   Final    BOTTLES DRAWN AEROBIC AND ANAEROBIC Blood Culture adequate volume Performed at [facility], [address], [place], [place] [address]    Culture   Final    NO GROWTH 2 DAYS Performed at [facility], [address], [place], [place] [address]    Report Status PENDING  Incomplete  Urine culture     Status: Abnormal (Preliminary result)   Collection Time: 10/24/20  7:54 PM   Specimen: Urine, Random  Result Value Ref Range Status   Specimen Description   Final    URINE, RANDOM Performed at [facility], [address], [place], [place] [address]    Special Requests   Final    NONE Performed at [facility], [address], [place], [place] [address]    Culture 60,000 COLONIES/mL GRAM NEGATIVE RODS (A)  Final   Report Status PENDING  Incomplete  Resp Panel by RT-PCR (Flu A&B, Covid) Nasopharyngeal Swab     Status: None   Collection Time: 10/24/20  7:54 PM   Specimen: Nasopharyngeal Swab; Nasopharyngeal(NP) swabs in vial transport medium  Result Value Ref Range Status   SARS Coronavirus 2 by RT PCR NEGATIVE NEGATIVE  Final    Comment: (NOTE) SARS-CoV-2 target nucleic acids are NOT DETECTED.  The SARS-CoV-2 RNA is generally detectable in upper respiratory specimens during the acute phase of infection. The lowest concentration of SARS-CoV-2 viral copies this assay can detect is 138 copies/mL. A negative result does not preclude SARS-Cov-2 infection and should not be used as the sole basis for treatment or other patient management decisions. A negative result may occur with  improper specimen collection/handling, submission of specimen other than nasopharyngeal swab, presence of viral mutation(s) within the areas targeted by this assay, and inadequate number of viral copies(<138 copies/mL). A negative result must be combined with clinical observations, patient history, and epidemiological information. The expected result is Negative.  Fact Sheet for Patients:  BloggerCourse.com  Fact Sheet for Healthcare Providers:  SeriousBroker.it  This test is no t yet approved or cleared by the [place] FDA and  has been authorized for detection and/or diagnosis of SARS-CoV-2 by FDA under an Emergency Use Authorization (EUA). This EUA will remain  in effect (meaning this test can be used) for the duration of the COVID-19 declaration under Section 564(b)(1) of the Act, 21 U.S.C.section 360bbb-3(b)(1), unless the authorization is terminated  or revoked sooner.       Influenza A by PCR NEGATIVE NEGATIVE Final   Influenza B by PCR NEGATIVE NEGATIVE Final    Comment: (NOTE) The Xpert Xpress SARS-CoV-2/FLU/RSV plus assay is intended as an aid in the diagnosis of influenza from Nasopharyngeal swab specimens and should not be used as a sole basis for treatment. Nasal washings and aspirates are unacceptable for Xpert Xpress SARS-CoV-2/FLU/RSV testing.  Fact Sheet for Patients: BloggerCourse.com  Fact Sheet for Healthcare  Providers: SeriousBroker.it  This test is not yet approved or cleared by the [place] FDA and has been authorized for detection and/or diagnosis of SARS-CoV-2 by FDA under an Emergency Use Authorization (EUA).  This EUA will remain in effect (meaning this test can be used) for the duration of the COVID-19 declaration under Section 564(b)(1) of the Act, 21 U.S.C. section 360bbb-3(b)(1), unless the authorization is terminated or revoked.  Performed at [facility], [address] [place], [place] [address]   MRSA PCR Screening     Status: None   Collection Time: 10/25/20  2:17 AM   Specimen: Nasopharyngeal  Result Value Ref Range Status   MRSA by PCR NEGATIVE NEGATIVE Final    Comment:        The GeneXpert MRSA Assay (FDA approved for NASAL specimens only), is one component of a comprehensive MRSA colonization surveillance program. It is not intended to diagnose MRSA infection nor to guide or monitor treatment for MRSA infections. Performed at [facility], [address], [place], [place] [address]          Radiology Studies: DG Abd 1 View  Result Date: 10/26/2020 CLINICAL DATA:  Intractable nausea and vomiting EXAM: ABDOMEN - 1 VIEW COMPARISON:  10/24/2020 FINDINGS: Scattered large and small bowel gas is noted. No obstructive changes are seen. No free air is noted. No bony abnormality is seen. IMPRESSION: No acute abnormality noted. Electronically Signed   By: Alcide Clever M.D.   On: 10/26/2020 11:04   CT Abdomen Pelvis W Contrast  Result Date: 10/24/2020 CLINICAL DATA:  34 year old female with abdominal pain, nausea vomiting. EXAM: CT ABDOMEN AND PELVIS WITH CONTRAST TECHNIQUE: Multidetector CT imaging of the abdomen and pelvis was performed using the standard protocol following bolus administration of intravenous contrast. CONTRAST:  OMNIPAQUE IOHEXOL 300 MG/ML  SOLN COMPARISON:  None. FINDINGS:  Lower chest: The visualized lung bases are clear. No intra-abdominal free air or free fluid. Hepatobiliary: No focal liver abnormality is seen. No gallstones, gallbladder wall thickening, or biliary dilatation. Pancreas: Unremarkable. No pancreatic ductal dilatation or surrounding inflammatory changes. Spleen: Normal in size without focal abnormality. Adrenals/Urinary Tract: The adrenal glands unremarkable. There is heterogeneous enhancement of the right renal parenchyma most consistent with pyelonephritis. Correlation with urinalysis recommended. No drainable fluid collection or abscess. The left kidney is unremarkable. There is no hydronephrosis on either side. The visualized ureters and urinary bladder appear unremarkable. Stomach/Bowel: There is no bowel obstruction or active inflammation. The appendix is normal. Vascular/Lymphatic: The abdominal aorta and IVC unremarkable. No portal venous gas. There is no adenopathy. Reproductive: The uterus and ovaries are grossly unremarkable. No pelvic mass. Other: None Musculoskeletal: No acute or significant osseous findings. IMPRESSION: 1. Right-sided pyelonephritis. Correlation with urinalysis recommended. No drainable fluid collection or abscess. 2. No bowel obstruction. Normal appendix. Electronically Signed   By: Elgie Collard M.D.   On: 10/24/2020 22:39   DG Chest Portable 1 View  Result Date: 10/24/2020 CLINICAL DATA:  Cough EXAM: PORTABLE CHEST 1 VIEW COMPARISON:  None. FINDINGS: The heart size and mediastinal contours are within normal limits. Both lungs are clear. The visualized skeletal structures are unremarkable. IMPRESSION: No active disease. Electronically Signed   By: Deatra Robinson M.D.   On: 10/24/2020 21:48        Scheduled Meds: . Chlorhexidine Gluconate Cloth  6 each Topical Daily  . enoxaparin (LOVENOX) injection  40 mg Subcutaneous Q24H  . magnesium oxide  400 mg Oral BID  . polyethylene glycol  17 g Oral Daily  . potassium  chloride  40 mEq Oral BID   Continuous Infusions: . cefTRIAXone (ROCEPHIN)  IV Stopped (10/25/20 2322)  . dextrose 5%  lactated ringers Stopped (10/26/20 0759)  . insulin Stopped (10/26/20 0759)  . lactated ringers Stopped (10/25/20 0046)     LOS: 2 days    Time spent: 35 minutes    Dorcas Carrow, MD [facility] Pager [phone]

## 2020-10-27 DIAGNOSIS — N10 Acute tubulo-interstitial nephritis: Secondary | ICD-10-CM | POA: Diagnosis not present

## 2020-10-27 DIAGNOSIS — E1010 Type 1 diabetes mellitus with ketoacidosis without coma: Secondary | ICD-10-CM | POA: Diagnosis not present

## 2020-10-27 LAB — URINE CULTURE: Culture: 60000 — AB

## 2020-10-27 LAB — CBC WITH DIFFERENTIAL/PLATELET
Abs Immature Granulocytes: 0.04 10*3/uL (ref 0.00–0.07)
Basophils Absolute: 0 10*3/uL (ref 0.0–0.1)
Basophils Relative: 0 %
Eosinophils Absolute: 0 10*3/uL (ref 0.0–0.5)
Eosinophils Relative: 0 %
HCT: 42.3 % (ref 36.0–46.0)
Hemoglobin: 13.8 g/dL (ref 12.0–15.0)
Immature Granulocytes: 1 %
Lymphocytes Relative: 19 %
Lymphs Abs: 1 10*3/uL (ref 0.7–4.0)
MCH: 27.8 pg (ref 26.0–34.0)
MCHC: 32.6 g/dL (ref 30.0–36.0)
MCV: 85.3 fL (ref 80.0–100.0)
Monocytes Absolute: 0.5 10*3/uL (ref 0.1–1.0)
Monocytes Relative: 9 %
Neutro Abs: 3.7 10*3/uL (ref 1.7–7.7)
Neutrophils Relative %: 71 %
Platelets: 238 10*3/uL (ref 150–400)
RBC: 4.96 MIL/uL (ref 3.87–5.11)
RDW: 12.6 % (ref 11.5–15.5)
WBC: 5.2 10*3/uL (ref 4.0–10.5)
nRBC: 0 % (ref 0.0–0.2)

## 2020-10-27 LAB — COMPREHENSIVE METABOLIC PANEL
ALT: 9 U/L (ref 0–44)
AST: 10 U/L — ABNORMAL LOW (ref 15–41)
Albumin: 2.6 g/dL — ABNORMAL LOW (ref 3.5–5.0)
Alkaline Phosphatase: 69 U/L (ref 38–126)
Anion gap: 10 (ref 5–15)
BUN: <5 mg/dL — ABNORMAL LOW (ref 6–20)
CO2: 21 mmol/L — ABNORMAL LOW (ref 22–32)
Calcium: 7.9 mg/dL — ABNORMAL LOW (ref 8.9–10.3)
Chloride: 105 mmol/L (ref 98–111)
Creatinine, Ser: 0.38 mg/dL — ABNORMAL LOW (ref 0.44–1.00)
GFR, Estimated: >60 mL/min (ref >60)
Glucose, Bld: 237 mg/dL — ABNORMAL HIGH (ref 70–99)
Potassium: 3.3 mmol/L — ABNORMAL LOW (ref 3.5–5.1)
Sodium: 136 mmol/L (ref 135–145)
Total Bilirubin: 0.4 mg/dL (ref 0.3–1.2)
Total Protein: 6.2 g/dL — ABNORMAL LOW (ref 6.5–8.1)

## 2020-10-27 LAB — GLUCOSE, CAPILLARY
Glucose-Capillary: 137 mg/dL — ABNORMAL HIGH (ref 70–99)
Glucose-Capillary: 142 mg/dL — ABNORMAL HIGH (ref 70–99)
Glucose-Capillary: 177 mg/dL — ABNORMAL HIGH (ref 70–99)
Glucose-Capillary: 194 mg/dL — ABNORMAL HIGH (ref 70–99)
Glucose-Capillary: 203 mg/dL — ABNORMAL HIGH (ref 70–99)
Glucose-Capillary: 212 mg/dL — ABNORMAL HIGH (ref 70–99)
Glucose-Capillary: 220 mg/dL — ABNORMAL HIGH (ref 70–99)
Glucose-Capillary: 222 mg/dL — ABNORMAL HIGH (ref 70–99)
Glucose-Capillary: 222 mg/dL — ABNORMAL HIGH (ref 70–99)
Glucose-Capillary: 245 mg/dL — ABNORMAL HIGH (ref 70–99)
Glucose-Capillary: 271 mg/dL — ABNORMAL HIGH (ref 70–99)

## 2020-10-27 LAB — PHOSPHORUS: Phosphorus: 2.1 mg/dL — ABNORMAL LOW (ref 2.5–4.6)

## 2020-10-27 LAB — MAGNESIUM: Magnesium: 1.7 mg/dL (ref 1.7–2.4)

## 2020-10-27 MED ORDER — CEFAZOLIN SODIUM-DEXTROSE 1-4 GM/50ML-% IV SOLN
1.0000 g | Freq: Three times a day (TID) | INTRAVENOUS | Status: DC
  Administered 2020-10-27 – 2020-10-28 (×3): 1 g via INTRAVENOUS
  Filled 2020-10-27 (×3): qty 50

## 2020-10-27 MED ORDER — INSULIN ASPART 100 UNIT/ML ~~LOC~~ SOLN
0.0000 [IU] | SUBCUTANEOUS | Status: DC
  Administered 2020-10-27: 2 [IU] via SUBCUTANEOUS
  Administered 2020-10-27: 8 [IU] via SUBCUTANEOUS
  Administered 2020-10-27: 2 [IU] via SUBCUTANEOUS
  Administered 2020-10-28 (×2): 3 [IU] via SUBCUTANEOUS
  Administered 2020-10-28: 2 [IU] via SUBCUTANEOUS

## 2020-10-27 MED ORDER — INSULIN ASPART 100 UNIT/ML ~~LOC~~ SOLN
3.0000 [IU] | Freq: Three times a day (TID) | SUBCUTANEOUS | Status: DC
  Administered 2020-10-28 (×2): 3 [IU] via SUBCUTANEOUS

## 2020-10-27 MED ORDER — POTASSIUM CHLORIDE CRYS ER 20 MEQ PO TBCR
40.0000 meq | EXTENDED_RELEASE_TABLET | Freq: Two times a day (BID) | ORAL | Status: DC
  Administered 2020-10-27 – 2020-10-28 (×3): 40 meq via ORAL
  Filled 2020-10-27 (×2): qty 2

## 2020-10-27 MED ORDER — ONDANSETRON HCL 4 MG/2ML IJ SOLN
4.0000 mg | Freq: Four times a day (QID) | INTRAMUSCULAR | Status: DC | PRN
  Administered 2020-10-28: 4 mg via INTRAVENOUS
  Filled 2020-10-27: qty 2

## 2020-10-27 MED ORDER — INSULIN GLARGINE 100 UNIT/ML ~~LOC~~ SOLN
15.0000 [IU] | Freq: Every day | SUBCUTANEOUS | Status: DC
  Administered 2020-10-27 – 2020-10-28 (×2): 15 [IU] via SUBCUTANEOUS
  Filled 2020-10-27 (×2): qty 0.15

## 2020-10-27 NOTE — Progress Notes (Signed)
PROGRESS NOTE    Charna ArcherFatima Har Ibrahim Dejarnett  RUE:[id number]:[id number] DOB: 03/20/1986 DOA: 10/24/2020 PCP: Patient, No Pcp Per    Brief Narrative:  34 year old female with history of type 1 diabetes on Novolin 70/30 at home (not on med list) that she takes over-the-counter, no maintenance health care presented to the ER with about 1 week of not feeling well, 3 days of nausea vomiting.  In the emergency room, patient was found to have active nausea, abnormal urine, right-sided pyelonephritis and DKA.  Patient was then admitted to hospital for further evaluation and treatment.  Assessment & Plan:   Principal Problem:   DKA, type 1 (HCC) Active Problems:   Acute pyelonephritis  Diabetic ketoacidosis with type 1 diabetes.  Likely exacerbated by urinary tract infection infection.  On insulin drip.  Does not feel nauseated today. we will advance diet as tolerated.  Hemoglobin A1c of 11.  Patient is taking over-the-counter Novolin from [employer].  Transition of care to assist with PCP follow-up .  Blood cultures negative in 3 days.  Will add long-acting Lantus and sliding scale insulin once the insulin drip is stopped.  Communicated with the nursing staff.  Hypokalemia  Continue to replenish.  Check BMP in a.m.  Acute pyelonephritis: On IV Rocephin.  Urine culture showing 60,000 colonies of E. coli.  We will continue IV antibiotic for now.  Hypertension: Currently on as needed hydralazine.  Her blood pressure has been consistently high.  Will need oral antihypertensives on discharge.  Persistent nausea and vomiting: improved at this time.  DVT prophylaxis: enoxaparin (LOVENOX) injection 40 mg Start: 10/24/20 2330   Code Status: Full code  Family Communication:  None  Disposition Plan: Status is: Inpatient  Remains inpatient appropriate because:Inpatient level of care appropriate due to severity of illness, poor oral tolerance, on insulin drip   Dispo: The patient is from: Home               Anticipated d/c is to: Home              Anticipated d/c date is: 1 to 2 days.  Okay to transfer out of the stepdown unit once insulin drip is discontinued.              Patient currently is not medically stable to d/c.   Consultants:   None  Procedures:   None  Antimicrobials:   Rocephin, 12/2->   Subjective: Today, patient was seen and examined at bedside.  Patient states that she feels better with nausea today has not had vomiting.  Denies any abdominal pain.  Willing to eat.   Objective: Vitals:   10/27/20 0600 10/27/20 0700 10/27/20 0800 10/27/20 0811  BP: (!) 149/71 (!) 165/92 (!) 157/96   Pulse: (!) 110 (!) 102 (!) 101   Resp: 19 20 (!) 21   Temp:    99 F (37.2 C)  TempSrc:    Oral  SpO2: 100% 100% 100%   Weight:      Height:        Intake/Output Summary (Last 24 hours) at 10/27/2020 0944 Last data filed at 10/26/2020 1800 Gross per 24 hour  Intake 1267.89 ml  Output 100 ml  Net 1167.89 ml   Filed Weights   10/24/20 2032  Weight: 61.2 kg    Physical examination:  General:  Average built, not in obvious distress, alert awake and communicative HENT:   No scleral pallor or icterus noted. Oral mucosa is moist.  Chest:  Clear breath  sounds.  Diminished breath sounds bilaterally. No crackles or wheezes.  CVS: S1 &S2 heard. No murmur.  Regular rate and rhythm. Abdomen: Soft, nontender, bowel sounds are present. nontender, nondistended.  Bowel sounds are heard.   Extremities: No cyanosis, clubbing or edema.  Peripheral pulses are palpable. Psych: Alert, awake and oriented, normal mood CNS:  No cranial nerve deficits.  Power equal in all extremities.   Skin: Warm and dry.  No rashes noted.  Data Reviewed: I have personally reviewed following labs and imaging studies  CBC: Recent Labs  Lab 10/24/20 1954 10/24/20 2036 10/27/20 0302  WBC 6.4  --  5.2  NEUTROABS 5.7  --  3.7  HGB 14.9 15.6* 13.8  HCT 45.5 46.0 42.3  MCV 84.9  --  85.3  PLT 231  --   238   Basic Metabolic Panel: Recent Labs  Lab 10/25/20 0921 10/25/20 1229 10/25/20 1624 10/26/20 0912 10/27/20 0302  NA 139 138 137 136 136  K 3.1* 3.1* 3.3* 3.1* 3.3*  CL 106 105 105 104 105  CO2 22 24 22 22  21*  GLUCOSE 179* 184* 201* 170* 237*  BUN 6 6 <5* <5* <5*  CREATININE 0.30* 0.35* 0.39* 0.37* 0.38*  CALCIUM 8.3* 8.4* 8.4* 8.4* 7.9*  MG  --  1.7  --   --  1.7  PHOS  --  2.1*  --   --  2.1*   GFR: Estimated Creatinine Clearance: 81.2 mL/min (A) (by C-G formula based on SCr of 0.38 mg/dL (L)). Liver Function Tests: Recent Labs  Lab 10/24/20 1954 10/27/20 0302  AST 14* 10*  ALT 12 9  ALKPHOS 92 69  BILITOT 1.0 0.4  PROT 8.5* 6.2*  ALBUMIN 3.8 2.6*   Recent Labs  Lab 10/24/20 1954  LIPASE 19   No results for input(s): AMMONIA in the last 168 hours. Coagulation Profile: No results for input(s): INR, PROTIME in the last 168 hours. Cardiac Enzymes: No results for input(s): CKTOTAL, CKMB, CKMBINDEX, TROPONINI in the last 168 hours. BNP (last 3 results) No results for input(s): PROBNP in the last 8760 hours. HbA1C: Recent Labs    10/25/20 0301  HGBA1C 11.9*   CBG: Recent Labs  Lab 10/26/20 2229 10/27/20 0030 10/27/20 0323 10/27/20 0532 10/27/20 0810  GLUCAP 235* 245* 203* 220* 222*   Lipid Profile: No results for input(s): CHOL, HDL, LDLCALC, TRIG, CHOLHDL, LDLDIRECT in the last 72 hours. Thyroid Function Tests: No results for input(s): TSH, T4TOTAL, FREET4, T3FREE, THYROIDAB in the last 72 hours. Anemia Panel: No results for input(s): VITAMINB12, FOLATE, FERRITIN, TIBC, IRON, RETICCTPCT in the last 72 hours. Sepsis Labs: Recent Labs  Lab 10/24/20 1954 10/24/20 2324  LATICACIDVEN 1.4 1.6    Recent Results (from the past 240 hour(s))  Culture, blood (routine x 2)     Status: None (Preliminary result)   Collection Time: 10/24/20  7:54 PM   Specimen: BLOOD  Result Value Ref Range Status   Specimen Description   Final    BLOOD RIGHT  ANTECUBITAL Performed at [facility], [address] [place], [place]    Special Requests   Final    BOTTLES DRAWN AEROBIC AND ANAEROBIC Blood Culture adequate volume Performed at [facility], [address] [place], [place]    Culture   Final    NO GROWTH 3 DAYS Performed at [facility], [address], [place], [place]    Report Status PENDING  Incomplete  Culture, blood (routine x 2)     Status: None (Preliminary result)   Collection Time: 10/24/20  7:54 PM   Specimen: BLOOD  Result Value Ref Range Status   Specimen Description   Final    BLOOD LEFT ANTECUBITAL Performed at [facility], [address], [place], [place] [address]    Special Requests   Final    BOTTLES DRAWN AEROBIC AND ANAEROBIC Blood Culture adequate volume Performed at [facility], [address], [place], [place] [address]    Culture   Final    NO GROWTH 3 DAYS Performed at [facility], [address], [place], [place] [address]    Report Status PENDING  Incomplete  Urine culture     Status: Abnormal   Collection Time: 10/24/20  7:54 PM   Specimen: Urine, Random  Result Value Ref Range Status   Specimen Description   Final    URINE, RANDOM Performed at [facility], [address] [place], [place] [address]    Special Requests   Final    NONE Performed at [facility], [address], [place], [place] [address]    Culture 60,000 COLONIES/mL ESCHERICHIA COLI (A)  Final   Report Status 10/27/2020 FINAL  Final   Organism ID, Bacteria ESCHERICHIA COLI (A)  Final      Susceptibility   Escherichia coli - MIC*    AMPICILLIN <=2 SENSITIVE Sensitive     CEFAZOLIN <=4 SENSITIVE Sensitive     CEFEPIME <=0.12 SENSITIVE Sensitive     CEFTRIAXONE <=0.25 SENSITIVE Sensitive     CIPROFLOXACIN <=0.25 SENSITIVE Sensitive     GENTAMICIN <=1 SENSITIVE  Sensitive     IMIPENEM <=0.25 SENSITIVE Sensitive     NITROFURANTOIN <=16 SENSITIVE Sensitive     TRIMETH/SULFA <=20 SENSITIVE Sensitive     AMPICILLIN/SULBACTAM <=2 SENSITIVE Sensitive     PIP/TAZO <=4 SENSITIVE Sensitive     * 60,000 COLONIES/mL ESCHERICHIA COLI  Resp Panel by RT-PCR (Flu A&B, Covid) Nasopharyngeal Swab     Status: None   Collection Time: 10/24/20  7:54 PM   Specimen: Nasopharyngeal Swab; Nasopharyngeal(NP) swabs in vial transport medium  Result Value Ref Range Status   SARS Coronavirus 2 by RT PCR NEGATIVE NEGATIVE Final    Comment: (NOTE) SARS-CoV-2 target nucleic acids are NOT DETECTED.  The SARS-CoV-2 RNA is generally detectable in upper respiratory specimens during the acute phase of infection. The lowest concentration of SARS-CoV-2 viral copies this assay can detect is 138 copies/mL. A negative result does not preclude SARS-Cov-2 infection and should not be used as the sole basis for treatment or other patient management decisions. A negative result may occur with  improper specimen collection/handling, submission of specimen other than nasopharyngeal swab, presence of viral mutation(s) within the areas targeted by this assay, and inadequate number of viral copies(<138 copies/mL). A negative result must be combined with clinical observations, patient history, and epidemiological information. The expected result is Negative.  Fact Sheet for Patients:  BloggerCourse.com  Fact Sheet for Healthcare Providers:  SeriousBroker.it  This test is no t yet approved or cleared by the [place] FDA and  has been authorized for detection and/or diagnosis of SARS-CoV-2 by FDA under an Emergency Use Authorization (EUA). This EUA will remain  in effect (meaning this test can be used) for the duration of the COVID-19 declaration under Section 564(b)(1) of the Act, 21 U.S.C.section 360bbb-3(b)(1), unless the  authorization is terminated  or  revoked sooner.       Influenza A by PCR NEGATIVE NEGATIVE Final   Influenza B by PCR NEGATIVE NEGATIVE Final    Comment: (NOTE) The Xpert Xpress SARS-CoV-2/FLU/RSV plus assay is intended as an aid in the diagnosis of influenza from Nasopharyngeal swab specimens and should not be used as a sole basis for treatment. Nasal washings and aspirates are unacceptable for Xpert Xpress SARS-CoV-2/FLU/RSV testing.  Fact Sheet for Patients: BloggerCourse.com  Fact Sheet for Healthcare Providers: SeriousBroker.it  This test is not yet approved or cleared by the [place] FDA and has been authorized for detection and/or diagnosis of SARS-CoV-2 by FDA under an Emergency Use Authorization (EUA). This EUA will remain in effect (meaning this test can be used) for the duration of the COVID-19 declaration under Section 564(b)(1) of the Act, 21 U.S.C. section 360bbb-3(b)(1), unless the authorization is terminated or revoked.  Performed at [facility], [address] [place], [place] [address]   MRSA PCR Screening     Status: None   Collection Time: 10/25/20  2:17 AM   Specimen: Nasopharyngeal  Result Value Ref Range Status   MRSA by PCR NEGATIVE NEGATIVE Final    Comment:        The GeneXpert MRSA Assay (FDA approved for NASAL specimens only), is one component of a comprehensive MRSA colonization surveillance program. It is not intended to diagnose MRSA infection nor to guide or monitor treatment for MRSA infections. Performed at [facility], [address], [place], [place] [address]        Radiology Studies: DG Abd 1 View  Result Date: 10/26/2020 CLINICAL DATA:  Intractable nausea and vomiting EXAM: ABDOMEN - 1 VIEW COMPARISON:  10/24/2020 FINDINGS: Scattered large and small bowel gas is noted. No obstructive changes are seen. No free air is noted. No bony  abnormality is seen. IMPRESSION: No acute abnormality noted. Electronically Signed   By: Alcide Clever M.D.   On: 10/26/2020 11:04       Scheduled Meds: . Chlorhexidine Gluconate Cloth  6 each Topical Daily  . enoxaparin (LOVENOX) injection  40 mg Subcutaneous Q24H  . magnesium oxide  400 mg Oral BID  . polyethylene glycol  17 g Oral Daily  . potassium chloride  40 mEq Oral BID   Continuous Infusions: . cefTRIAXone (ROCEPHIN)  IV Stopped (10/26/20 2358)  . dextrose 5% lactated ringers 125 mL/hr at 10/27/20 0823  . insulin 2 Units/hr (10/26/20 1903)  . lactated ringers Stopped (10/25/20 0046)     LOS: 3 days   Joycelyn Das, MD [facility]

## 2020-10-28 DIAGNOSIS — E876 Hypokalemia: Secondary | ICD-10-CM

## 2020-10-28 DIAGNOSIS — N10 Acute tubulo-interstitial nephritis: Secondary | ICD-10-CM | POA: Diagnosis not present

## 2020-10-28 DIAGNOSIS — E1010 Type 1 diabetes mellitus with ketoacidosis without coma: Secondary | ICD-10-CM | POA: Diagnosis not present

## 2020-10-28 DIAGNOSIS — R112 Nausea with vomiting, unspecified: Secondary | ICD-10-CM

## 2020-10-28 DIAGNOSIS — I10 Essential (primary) hypertension: Secondary | ICD-10-CM

## 2020-10-28 LAB — COMPREHENSIVE METABOLIC PANEL
ALT: 9 U/L (ref 0–44)
AST: 10 U/L — ABNORMAL LOW (ref 15–41)
Albumin: 3 g/dL — ABNORMAL LOW (ref 3.5–5.0)
Alkaline Phosphatase: 74 U/L (ref 38–126)
Anion gap: 11 (ref 5–15)
BUN: <5 mg/dL — ABNORMAL LOW (ref 6–20)
CO2: 23 mmol/L (ref 22–32)
Calcium: 8.4 mg/dL — ABNORMAL LOW (ref 8.9–10.3)
Chloride: 104 mmol/L (ref 98–111)
Creatinine, Ser: 0.42 mg/dL — ABNORMAL LOW (ref 0.44–1.00)
GFR, Estimated: >60 mL/min (ref >60)
Glucose, Bld: 157 mg/dL — ABNORMAL HIGH (ref 70–99)
Potassium: 3.7 mmol/L (ref 3.5–5.1)
Sodium: 138 mmol/L (ref 135–145)
Total Bilirubin: 0.6 mg/dL (ref 0.3–1.2)
Total Protein: 6.7 g/dL (ref 6.5–8.1)

## 2020-10-28 LAB — CBC
HCT: 43.6 % (ref 36.0–46.0)
Hemoglobin: 13.9 g/dL (ref 12.0–15.0)
MCH: 27.5 pg (ref 26.0–34.0)
MCHC: 31.9 g/dL (ref 30.0–36.0)
MCV: 86.2 fL (ref 80.0–100.0)
Platelets: 272 10*3/uL (ref 150–400)
RBC: 5.06 MIL/uL (ref 3.87–5.11)
RDW: 12.7 % (ref 11.5–15.5)
WBC: 4.3 10*3/uL (ref 4.0–10.5)
nRBC: 0 % (ref 0.0–0.2)

## 2020-10-28 LAB — MAGNESIUM: Magnesium: 2 mg/dL (ref 1.7–2.4)

## 2020-10-28 LAB — GLUCOSE, CAPILLARY
Glucose-Capillary: 165 mg/dL — ABNORMAL HIGH (ref 70–99)
Glucose-Capillary: 122 mg/dL — ABNORMAL HIGH (ref 70–99)
Glucose-Capillary: 154 mg/dL — ABNORMAL HIGH (ref 70–99)

## 2020-10-28 LAB — PHOSPHORUS: Phosphorus: 3.4 mg/dL (ref 2.5–4.6)

## 2020-10-28 MED ORDER — NOVOLIN 70/30 (70-30) 100 UNIT/ML ~~LOC~~ SUSP: SUBCUTANEOUS | 3 refills | Status: DC

## 2020-10-28 MED ORDER — LISINOPRIL 20 MG PO TABS: 20.0000 mg | ORAL_TABLET | Freq: Every day | ORAL | 2 refills | Status: DC

## 2020-10-28 MED ORDER — PROMETHAZINE HCL 25 MG/ML IJ SOLN: 12.5000 mg | INTRAMUSCULAR | Status: DC | PRN

## 2020-10-28 MED ORDER — LISINOPRIL 10 MG PO TABS
20.0000 mg | ORAL_TABLET | Freq: Every day | ORAL | Status: DC
  Administered 2020-10-28: 20 mg via ORAL
  Filled 2020-10-28: qty 2

## 2020-10-28 MED ORDER — CEPHALEXIN 250 MG PO CAPS: 500.0000 mg | ORAL_CAPSULE | Freq: Three times a day (TID) | ORAL | 0 refills | Status: AC

## 2020-10-28 NOTE — TOC Progression Note (Signed)
Transition of Care [facility]) - Progression Note    Patient Details  Name: Kristin Clay MRN: [id number] Date of Birth: 1986/11/17  Transition of [facility]) CM/SW Contact  Golda Acre, RN Phone Number: 10/28/2020, 1:58 PM  Clinical Narrative:    Pt has appointment with dr. Rose Phi Clung DEcember 29,2021 at 1430pm.  Will inform patient of appointment.   Expected Discharge Plan: Home/Self Care Barriers to Discharge: No Barriers Identified  Expected Discharge Plan and Services Expected Discharge Plan: Home/Self Care   Discharge Planning Services: CM Consult   Living arrangements for the past 2 months: Apartment Expected Discharge Date: 10/28/20                                     Social Determinants of Health (SDOH) Interventions    Readmission Risk Interventions No flowsheet data found.

## 2020-10-28 NOTE — Progress Notes (Signed)
Patient has been discharged. Patient was picked up by husband at 1535. Patient received discharge instructions and paperwork and verbalized understanding.

## 2020-10-28 NOTE — Discharge Summary (Addendum)
Physician Discharge Summary  Kristin Clay YNW:[id number] DOB: [birth year] DOA: 10/24/2020  PCP: Kristin Clay, No Pcp Per  Admit date: 10/24/2020 Discharge date: 10/28/2020  Admitted From: Home  Discharge disposition: Home   Recommendations for Outpatient Follow-Up:   . Follow up with your primary care provider in one week.  . Check CBC, BMP, magnesium in the next visit . She has been increased on insulin dose and lisinopril has been initiated in the hospital.  Please adjust doses in the next visit   Discharge Diagnosis:   Principal Problem:   DKA, type 1 (HCC) Active Problems:   Acute pyelonephritis   Discharge Condition: Improved.  Diet recommendation:  Carbohydrate-modified  Wound care: None.  Code status: Full.   History of Present Illness:   34 year old female with history of type 1 diabetes on Novolin 70/30 at home (not on med list) that she takes over-the-counter, no maintenance health care presented to the ER with about 1 week of not feeling well, 3 days of nausea vomiting.  In the emergency room, Kristin Clay was found to have active nausea, abnormal urine, right-sided pyelonephritis and DKA.  Kristin Clay was then admitted to hospital for further evaluation and treatment.  Hospital Course:   Following conditions were addressed during hospitalization as listed below,  Diabetic ketoacidosis with type 1 diabetes.  Likely exacerbated by urinary tract infection/acute pyelonephritis.  Kristin Clay was initially put on insulin drip.  Gradually diet was advanced.  Initially was nauseated.  Hemoglobin A1c of 11. Kristin Clay is taking over-the-counter Novolin from [employer].    Seen by diabetic coordinator during hospitalization.  At this time dose of insulin has been increased on discharge.  Kristin Clay will need to follow-up with primary care physician after discharge.  Blood glucose levels have significantly improved at this time.  With negative in 4 days.  Hypokalemia  Replenished  and Improved.  Potassium prior to discharge was 3.7  Acute pyelonephritis:  sepsis ruled out.  Received IV Rocephin.  Urine culture showing 60,000 colonies of E. coli.  Sensitive to multiple antibiotic.  Will consider Keflex 500 mg 3 times daily for next 5 days to complete 7-day course on discharge  Hypertension:  Kristin Clay has been started on lisinopril 20 mg daily at this time.  Will need closer monitoring and adjustment of blood pressure medication with her primary care physician  Persistent nausea and vomiting: improved at this time.  Has tolerated oral diet.  Disposition.  At this time, Kristin Clay is stable for disposition home.  Spoke with the Kristin Clay's spouse at bedside regarding the plan for follow-up and need for using insulin  Medical Consultants:    None.  Procedures:    None Subjective:   Today, Kristin Clay is Referral denies any nausea vomiting abdominal pain.  Wants to go home.  Discharge Exam:   Vitals:   10/28/20 1423 10/28/20 1500  BP: (!) 164/88 (!) 185/103  Pulse:    Resp:  17  Temp:    SpO2:     Vitals:   10/28/20 1300 10/28/20 1400 10/28/20 1423 10/28/20 1500  BP: (!) 188/103 (!) 189/102 (!) 164/88 (!) 185/103  Pulse:      Resp: Temp:      TempSrc:      SpO2:      Weight:      Height:       Body mass index is 23.91 kg/m.  General: Alert awake, not in obvious distress, average built HENT: pupils equally reacting to light,  No scleral pallor or icterus noted. Oral mucosa is moist.  Chest:  Clear breath sounds.  Diminished breath sounds bilaterally. No crackles or wheezes.  CVS: S1 &S2 heard. No murmur.  Regular rate and rhythm. Abdomen: Soft, nontender, nondistended.  Bowel sounds are heard.   Extremities: No cyanosis, clubbing or edema.  Peripheral pulses are palpable. Psych: Alert, awake and oriented, normal mood CNS:  No cranial nerve deficits.  Power equal in all extremities.   Skin: Warm and dry.  No rashes noted.  The results of  significant diagnostics from this hospitalization (including imaging, microbiology, ancillary and laboratory) are listed below for reference.     Diagnostic Studies:   CT Abdomen Pelvis W Contrast  Result Date: 10/24/2020 CLINICAL DATA:  34 year old female with abdominal pain, nausea vomiting. EXAM: CT ABDOMEN AND PELVIS WITH CONTRAST TECHNIQUE: Multidetector CT imaging of the abdomen and pelvis was performed using the standard protocol following bolus administration of intravenous contrast. CONTRAST:  OMNIPAQUE IOHEXOL 300 MG/ML  SOLN COMPARISON:  None. FINDINGS: Lower chest: The visualized lung bases are clear. No intra-abdominal free air or free fluid. Hepatobiliary: No focal liver abnormality is seen. No gallstones, gallbladder wall thickening, or biliary dilatation. Pancreas: Unremarkable. No pancreatic ductal dilatation or surrounding inflammatory changes. Spleen: Normal in size without focal abnormality. Adrenals/Urinary Tract: The adrenal glands unremarkable. There is heterogeneous enhancement of the right renal parenchyma most consistent with pyelonephritis. Correlation with urinalysis recommended. No drainable fluid collection or abscess. The left kidney is unremarkable. There is no hydronephrosis on either side. The visualized ureters and urinary bladder appear unremarkable. Stomach/Bowel: There is no bowel obstruction or active inflammation. The appendix is normal. Vascular/Lymphatic: The abdominal aorta and IVC unremarkable. No portal venous gas. There is no adenopathy. Reproductive: The uterus and ovaries are grossly unremarkable. No pelvic mass. Other: None Musculoskeletal: No acute or significant osseous findings. IMPRESSION: 1. Right-sided pyelonephritis. Correlation with urinalysis recommended. No drainable fluid collection or abscess. 2. No bowel obstruction. Normal appendix. Electronically Signed   By: Elgie Collard M.D.   On: 10/24/2020 22:39   DG Chest Portable 1  View  Result Date: 10/24/2020 CLINICAL DATA:  Cough EXAM: PORTABLE CHEST 1 VIEW COMPARISON:  None. FINDINGS: The heart size and mediastinal contours are within normal limits. Both lungs are clear. The visualized skeletal structures are unremarkable. IMPRESSION: No active disease. Electronically Signed   By: Deatra Robinson M.D.   On: 10/24/2020 21:48     Labs:   Basic Metabolic Panel: Recent Labs  Lab 10/25/20 1229 10/25/20 1229 10/25/20 1624 10/25/20 1624 10/26/20 0912 10/26/20 0912 10/27/20 0302 10/28/20 0228  NA 138  --  137  --  136  --  136 138  K 3.1*   < > 3.3*   < > 3.1*   < > 3.3* 3.7  CL 105  --  105  --  104  --  105 104  CO2 24  --  22  --  22  --  21* 23  GLUCOSE 184*  --  201*  --  170*  --  237* 157*  BUN 6  --  <5*  --  <5*  --  <5* <5*  CREATININE 0.35*  --  0.39*  --  0.37*  --  0.38* 0.42*  CALCIUM 8.4*  --  8.4*  --  8.4*  --  7.9* 8.4*  MG 1.7  --   --   --   --   --  1.7 2.0  PHOS 2.1*  --   --   --   --   --  2.1* 3.4   < > = values in this interval not displayed.   GFR Estimated Creatinine Clearance: 81.2 mL/min (A) (by C-G formula based on SCr of 0.42 mg/dL (L)). Liver Function Tests: Recent Labs  Lab 10/24/20 1954 10/27/20 0302 10/28/20 0228  AST 14* 10* 10*  ALT 12 9 9   ALKPHOS 92 69 74  BILITOT 1.0 0.4 0.6  PROT 8.5* 6.2* 6.7  ALBUMIN 3.8 2.6* 3.0*   Recent Labs  Lab 10/24/20 1954  LIPASE 19   No results for input(s): AMMONIA in the last 168 hours. Coagulation profile No results for input(s): INR, PROTIME in the last 168 hours.  CBC: Recent Labs  Lab 10/24/20 1954 10/24/20 2036 10/27/20 0302 10/28/20 0228  WBC 6.4  --  5.2 4.3  NEUTROABS 5.7  --  3.7  --   HGB 14.9 15.6* 13.8 13.9  HCT 45.5 46.0 42.3 43.6  MCV 84.9  --  85.3 86.2  PLT 231  --  238 272   Cardiac Enzymes: No results for input(s): CKTOTAL, CKMB, CKMBINDEX, TROPONINI in the last 168 hours. BNP: Invalid input(s): POCBNP CBG: Recent Labs  Lab  10/27/20 2003 10/27/20 2332 10/28/20 0339 10/28/20 0739 10/28/20 1221  GLUCAP 142* 137* 165* 122* 154*   D-Dimer No results for input(s): DDIMER in the last 72 hours. Hgb A1c No results for input(s): HGBA1C in the last 72 hours. Lipid Profile No results for input(s): CHOL, HDL, LDLCALC, TRIG, CHOLHDL, LDLDIRECT in the last 72 hours. Thyroid function studies No results for input(s): TSH, T4TOTAL, T3FREE, THYROIDAB in the last 72 hours.  Invalid input(s): FREET3 Anemia work up No results for input(s): VITAMINB12, FOLATE, FERRITIN, TIBC, IRON, RETICCTPCT in the last 72 hours. Microbiology Recent Results (from the past 240 hour(s))  Culture, blood (routine x 2)     Status: None (Preliminary result)   Collection Time: 10/24/20  7:54 PM   Specimen: BLOOD  Result Value Ref Range Status   Specimen Description   Final    BLOOD RIGHT ANTECUBITAL Performed at [facility], [address], [place], [place]    Special Requests   Final    BOTTLES DRAWN AEROBIC AND ANAEROBIC Blood Culture adequate volume Performed at [facility], [address], [place], [place]    Culture   Final    NO GROWTH 4 DAYS Performed at [facility], [address], [place], [place]    Report Status PENDING  Incomplete  Culture, blood (routine x 2)     Status: None (Preliminary result)   Collection Time: 10/24/20  7:54 PM   Specimen: BLOOD  Result Value Ref Range Status   Specimen Description   Final    BLOOD LEFT ANTECUBITAL Performed at [facility], [address], [place], [place]    Special Requests   Final    BOTTLES DRAWN AEROBIC AND ANAEROBIC Blood Culture adequate volume Performed at [facility], [address], [place], [place]    Culture   Final    NO GROWTH 4 DAYS Performed at [facility], [address], [place], [place]    Report Status  PENDING  Incomplete  Urine culture     Status: Abnormal   Collection Time: 10/24/20  7:54 PM   Specimen: Urine, Random  Result Value Ref Range  Status   Specimen Description   Final    URINE, RANDOM Performed at [facility], [address] [place], [place] [address]    Special Requests   Final    NONE Performed at [facility], [address] [place], [place] [address]    Culture 60,000 COLONIES/mL ESCHERICHIA COLI (A)  Final   Report Status 10/27/2020 FINAL  Final   Organism ID, Bacteria ESCHERICHIA COLI (A)  Final      Susceptibility   Escherichia coli - MIC*    AMPICILLIN <=2 SENSITIVE Sensitive     CEFAZOLIN <=4 SENSITIVE Sensitive     CEFEPIME <=0.12 SENSITIVE Sensitive     CEFTRIAXONE <=0.25 SENSITIVE Sensitive     CIPROFLOXACIN <=0.25 SENSITIVE Sensitive     GENTAMICIN <=1 SENSITIVE Sensitive     IMIPENEM <=0.25 SENSITIVE Sensitive     NITROFURANTOIN <=16 SENSITIVE Sensitive     TRIMETH/SULFA <=20 SENSITIVE Sensitive     AMPICILLIN/SULBACTAM <=2 SENSITIVE Sensitive     PIP/TAZO <=4 SENSITIVE Sensitive     * 60,000 COLONIES/mL ESCHERICHIA COLI  Resp Panel by RT-PCR (Flu A&B, Covid) Nasopharyngeal Swab     Status: None   Collection Time: 10/24/20  7:54 PM   Specimen: Nasopharyngeal Swab; Nasopharyngeal(NP) swabs in vial transport medium  Result Value Ref Range Status   SARS Coronavirus 2 by RT PCR NEGATIVE NEGATIVE Final    Comment: (NOTE) SARS-CoV-2 target nucleic acids are NOT DETECTED.  The SARS-CoV-2 RNA is generally detectable in upper respiratory specimens during the acute phase of infection. The lowest concentration of SARS-CoV-2 viral copies this assay can detect is 138 copies/mL. A negative result does not preclude SARS-Cov-2 infection and should not be used as the sole basis for treatment or other Kristin Clay management decisions. A negative result may occur with  improper specimen collection/handling, submission of  specimen other than nasopharyngeal swab, presence of viral mutation(s) within the areas targeted by this assay, and inadequate number of viral copies(<138 copies/mL). A negative result must be combined with clinical observations, Kristin Clay history, and epidemiological information. The expected result is Negative.  Fact Sheet for Patients:  BloggerCourse.com  Fact Sheet for Healthcare Providers:  SeriousBroker.it  This test is no t yet approved or cleared by the [place] FDA and  has been authorized for detection and/or diagnosis of SARS-CoV-2 by FDA under an Emergency Use Authorization (EUA). This EUA will remain  in effect (meaning this test can be used) for the duration of the COVID-19 declaration under Section 564(b)(1) of the Act, 21 U.S.C.section 360bbb-3(b)(1), unless the authorization is terminated  or revoked sooner.       Influenza A by PCR NEGATIVE NEGATIVE Final   Influenza B by PCR NEGATIVE NEGATIVE Final    Comment: (NOTE) The Xpert Xpress SARS-CoV-2/FLU/RSV plus assay is intended as an aid in the diagnosis of influenza from Nasopharyngeal swab specimens and should not be used as a sole basis for treatment. Nasal washings and aspirates are unacceptable for Xpert Xpress SARS-CoV-2/FLU/RSV testing.  Fact Sheet for Patients: BloggerCourse.com  Fact Sheet for Healthcare Providers: SeriousBroker.it  This test is not yet approved or cleared by the [place] FDA and has been authorized for detection and/or diagnosis of SARS-CoV-2 by FDA under an Emergency Use Authorization (EUA). This EUA will remain in effect (meaning this test can be used) for the duration of the COVID-19 declaration under Section 564(b)(1) of the Act, 21 U.S.C. section 360bbb-3(b)(1), unless the authorization is terminated or revoked.  Performed at [facility], [address] [place], [place] [address]   MRSA PCR Screening     Status: None   Collection Time: 10/25/20  2:17 AM   Specimen: Nasopharyngeal  Result Value Ref Range Status   MRSA by PCR NEGATIVE NEGATIVE Final    Comment:        The GeneXpert MRSA Assay (FDA approved for NASAL specimens only), is one component of a comprehensive MRSA colonization surveillance program. It is not intended to diagnose MRSA infection nor to guide or monitor treatment for MRSA infections. Performed at [facility], [address] [place], [place] [address]      Discharge Instructions:   Discharge Instructions    Call MD for:  persistant nausea and vomiting   Complete by: As directed    Call MD for:  temperature >100.4   Complete by: As directed    Diet - low sodium heart healthy   Complete by: As directed    Diet Carb Modified   Complete by: As directed    Discharge instructions   Complete by: As directed    Take insulin as prescribed without interruption. Follow up with your primary care provider in one week.   Increase activity slowly   Complete by: As directed      Allergies as of 10/28/2020   No Known Allergies     Medication List    TAKE these medications   acetaminophen 500 MG tablet Commonly known as: TYLENOL Take 1,000 mg by mouth every 6 (six) hours as needed for mild pain.   cephALEXin 250 MG capsule Commonly known as: KEFLEX Take 2 capsules (500 mg total) by mouth 3 (three) times daily for 5 days.   lisinopril 20 MG tablet Commonly known as: ZESTRIL Take 1 tablet (20 mg total) by mouth daily.   NovoLIN 70/30 (70-30) 100 UNIT/ML injection Generic drug: insulin NPH-regular Human 42 units subq Q Am and 22 units subq Q PM          Follow-up Information    Anders SimmondsMcClung, Angela M, PA-C. Go on 11/20/2020.   Specialty: Family Medicine Why: appointment is at 230 pm on 122921/please be there 15 monutes early to fill out paperwork. Contact  information: [address] [place] [address] [phone]                Time coordinating discharge: 39 minutes  Signed:  Vasiliy Mccarry  [facility] 10/28/2020, 4:12 PM

## 2020-10-28 NOTE — Progress Notes (Signed)
Inpatient Diabetes Program Recommendations  AACE/ADA: New Consensus Statement on Inpatient Glycemic Control (2015)  Target Ranges:  Prepandial:   less than 140 mg/dL      Peak postprandial:   less than 180 mg/dL (1-2 hours)      Critically ill patients:  140 - 180 mg/dL   Lab Results  Component Value Date   GLUCAP 154 (H) 10/28/2020   HGBA1C 11.9 (H) 10/25/2020    Review of Glycemic Control  Diabetes history: Type 1 DM Outpatient Diabetes medications: Novolin 70/30 40 units in am and 20 units QPM Current orders for Inpatient glycemic control: Lantus 15 units QD, Novolog 0-15 imots Q4H + 3 units tidwc  HgbA1C - 11.9% - uncontrolled  Inpatient Diabetes Program Recommendations:     Novolin 70/30 42 units in am and 22 units QPM  Long discussion with pt regarding her diabetes management PTA. Pt states she gets her insulin from [employer] and takes 70/30 40 units in am and 20 units QPM. Pt states she checks her blood sugars several times/day and it's always 250 - 300s. Discussed HgbA1C of 11.9% and importance of getting it down to approx 7% to avoid long and short-term complications. Pt states her diet is good, eats healthy food. Weight has gone up approx 10 pounds in past year. Checks blood sugars approx 2-3x/day. Does not have PCP, needs MD to manage her diabetes. Spoke with TOC and appt being made with [facility] prior to discharge. Pt's blood sugars have been controlled for past 24H on significantly less insulin than pt is taking at home. Insists she's not eating too much and leaves off sweets. Appreciative of assistance with obtaining PCP. Pt said she never has hypos at home, discussed s/s and treatment. Reviewed diet principles and importance of exercise. Pt voices understanding.  Secure text to MD and spoke with RN and TOC regarding this pt.  Thank you. Ailene Ards, RD, LDN, CDE Inpatient Diabetes Coordinator [phone]

## 2020-10-28 NOTE — Plan of Care (Signed)
  Problem: Clinical Measurements: Goal: Cardiovascular complication will be avoided Outcome: Progressing   Problem: Nutrition: Goal: Adequate nutrition will be maintained Outcome: Progressing   Problem: Coping: Goal: Level of anxiety will decrease Outcome: Progressing   Problem: Elimination: Goal: Will not experience complications related to urinary retention Outcome: Progressing   Problem: Pain Managment: Goal: General experience of comfort will improve Outcome: Progressing   Problem: Health Behavior/Discharge Planning: Goal: Ability to manage health-related needs will improve 10/28/2020 0218 by Julio Alm, RN Outcome: Progressing 10/28/2020 0211 by Julio Alm, RN Outcome: Progressing 10/28/2020 0209 by Julio Alm, RN Outcome: Progressing   Problem: Fluid Volume: Goal: Ability to achieve a balanced intake and output will improve 10/28/2020 0218 by Julio Alm, RN Outcome: Progressing 10/28/2020 0211 by Julio Alm, RN Outcome: Progressing 10/28/2020 0209 by Julio Alm, RN Outcome: Progressing   Problem: Metabolic: Goal: Ability to maintain appropriate glucose levels will improve 10/28/2020 0218 by Julio Alm, RN Outcome: Progressing 10/28/2020 0211 by Julio Alm, RN Outcome: Progressing 10/28/2020 0209 by Julio Alm, RN Outcome: Progressing   Problem: Nutritional: Goal: Maintenance of adequate nutrition will improve 10/28/2020 0218 by Julio Alm, RN Outcome: Progressing 10/28/2020 0211 by Julio Alm, RN Outcome: Progressing

## 2020-10-29 ENCOUNTER — Encounter: Payer: Self-pay | Admitting: Adult Health Nurse Practitioner

## 2020-10-29 LAB — CULTURE, BLOOD (ROUTINE X 2)
Culture: NO GROWTH
Culture: NO GROWTH
Special Requests: ADEQUATE
Special Requests: ADEQUATE

## 2020-11-13 NOTE — Progress Notes (Deleted)
Patient ID: Kristin Clay, female   DOB: 1985-12-28, 34 y.o.   MRN: [id number]  After hospitalization 12/2-12/04/2020 with pyelonephritis   Follow up with your primary care provider in one week.   Check CBC, BMP, magnesium in the next visit  She has been increased on insulin dose and lisinopril has been initiated in the hospital.  Please adjust doses in the next visit   Diabetic ketoacidosis with type 1 diabetes. Likely exacerbated by urinary tract infection/acute pyelonephritis.  Patient was initially put on insulin drip.  Gradually diet was advanced.  Initially was nauseated.  Hemoglobin A1c of 11.Patient is taking over-the-counter Novolin from [employer].   Seen by diabetic coordinator during hospitalization.  At this time dose of insulin has been increased on discharge.  Patient will need to follow-up with primary care physician after discharge.  Blood glucose levels have significantly improved at this time.  With negative in 4 days.  Hypokalemia  Replenished and Improved.  Potassium prior to discharge was 3.7  Acute pyelonephritis: sepsis ruled out.  Received IV Rocephin. Urine culture showing 60,000 colonies of E. coli.  Sensitive to multiple antibiotic.  Will consider Keflex 500 mg 3 times daily for next 5 days to complete 7-day course on discharge  Hypertension: Patient has been started on lisinopril 20 mg daily at this time.  Will need closer monitoring and adjustment of blood pressure medication with her primary care physician  Persistent nausea and vomiting:improved at this time.  Has tolerated oral diet.

## 2020-11-20 ENCOUNTER — Inpatient Hospital Stay: Payer: 59 | Admitting: Physician Assistant

## 2020-11-23 NOTE — L&D Delivery Note (Addendum)
OB/GYN Faculty Practice Delivery Note  Kristin Clay is a 35 y.o. [phone] s/p low forceps assisted vaginal delivery at [redacted]w[redacted]d. She was admitted for cHTN with superimposed pre-eclampsia with severe features as well as type  1 diabetes and FGR  ROM: 26h 85m with clear fluid GBS Status: Positive Maximum Maternal Temperature: 97.52F  Labor Progress: Patient was transferred from antepartum to L&D for IOL for cHTN with SI pre-Eclampsia and Type 1 DM. She received 5 doses of cytotec and had a cooks balloon as well as pitocin during her induction. She ruptured on her own but then had multiple forebags that were ruptured. Ultimately she progressed to fully.   Delivery Date/Time: 1348 on 06/29/2021 Delivery: Called to room and patient was complete and pushing. Despite pushing efforts head did not deliver and fetal heart rate prolonged deceleration to the 70s and there was evidence of maternal fatigue/poor maternal effort.  Given EFW, unable to do vacuum, after SVE showed +3, direct OA.   Mother verbally consented for forceps assisted delivery with assistance of interpreter.   Foley removed and decision was made to cut a right mediolateral episiotomy and fenestrated Simpson forceps applied without difficulty after epidural bolus with anesthesia.  With next contraction, patient easily delivered baby with forceps disarticulated prior to full delivery of fetal head. No nuchal cord present. Shoulder and body delivered in usual fashion. Infant with spontaneous cry, placed on mother's abdomen, dried and stimulated. Cord clamped x 2 and cut Infant was handed over to NICU team. Cord blood drawn but unfortunately clotted so no gases could be run. Placenta delivered spontaneously with gentle cord traction. Fundus firm with massage and Pitocin. Labia, perineum, vagina, and cervix inspected.   Rectal done and 3c perineal laceration noted with a moderately long right sulcul laceration at the area of the  episiotomy. Internal anal sphincter intact. Sulcal repaired in usual fashion with 2-0 vicryl and end to end repair of external anal sphincter  with 1-0 vicryl then done. Repeat recal exam confirmatory. Also, well as a superficial abrasion above patient's previous infibulation scar which were all repaired with 2-0 and 3-0 vicryl  Placenta: delivered intact, 3V cord, sent to pathology  Lacerations: 3c perineal laceration, right mediolateral episiotomy with sulcal laceration  EBL: Analgesia: epidural   Infant: Female  APGARs 7,9  1860g  Warner Mccreedy, MD, MPH OB Fellow, Faculty Practice Center for [facility], [facility] 06/29/2021, 6:13 PM   Agree with above. I was present for the entire procedure.   Cornelia Copa MD Attending Center for [employer] [facility])

## 2021-01-14 ENCOUNTER — Encounter: Payer: Self-pay | Admitting: Adult Health Nurse Practitioner

## 2021-04-11 ENCOUNTER — Inpatient Hospital Stay (HOSPITAL_COMMUNITY)
Admission: AD | Admit: 2021-04-11 | Discharge: 2021-04-13 | Disposition: A | Discharge status: Home / Self Care | Payer: Medicaid Other | Attending: Obstetrics & Gynecology | Admitting: Obstetrics & Gynecology

## 2021-04-11 ENCOUNTER — Other Ambulatory Visit: Payer: Self-pay

## 2021-04-11 ENCOUNTER — Encounter (HOSPITAL_COMMUNITY): Payer: Self-pay | Admitting: Obstetrics & Gynecology

## 2021-04-11 ENCOUNTER — Inpatient Hospital Stay (HOSPITAL_BASED_OUTPATIENT_CLINIC_OR_DEPARTMENT_OTHER): Payer: Medicaid Other

## 2021-04-11 DIAGNOSIS — O0932 Supervision of pregnancy with insufficient antenatal care, second trimester: Secondary | ICD-10-CM

## 2021-04-11 DIAGNOSIS — E109 Type 1 diabetes mellitus without complications: Secondary | ICD-10-CM

## 2021-04-11 DIAGNOSIS — O26892 Other specified pregnancy related conditions, second trimester: Secondary | ICD-10-CM | POA: Diagnosis not present

## 2021-04-11 DIAGNOSIS — Z20822 Contact with and (suspected) exposure to covid-19: Secondary | ICD-10-CM | POA: Insufficient documentation

## 2021-04-11 DIAGNOSIS — R109 Unspecified abdominal pain: Secondary | ICD-10-CM | POA: Diagnosis not present

## 2021-04-11 DIAGNOSIS — O212 Late vomiting of pregnancy: Secondary | ICD-10-CM | POA: Insufficient documentation

## 2021-04-11 DIAGNOSIS — Z794 Long term (current) use of insulin: Secondary | ICD-10-CM | POA: Insufficient documentation

## 2021-04-11 DIAGNOSIS — O24012 Pre-existing diabetes mellitus, type 1, in pregnancy, second trimester: Secondary | ICD-10-CM | POA: Insufficient documentation

## 2021-04-11 DIAGNOSIS — Z3A24 24 weeks gestation of pregnancy: Secondary | ICD-10-CM

## 2021-04-11 DIAGNOSIS — Z363 Encounter for antenatal screening for malformations: Secondary | ICD-10-CM

## 2021-04-11 DIAGNOSIS — E1065 Type 1 diabetes mellitus with hyperglycemia: Secondary | ICD-10-CM | POA: Insufficient documentation

## 2021-04-11 DIAGNOSIS — O365920 Maternal care for other known or suspected poor fetal growth, second trimester, not applicable or unspecified: Secondary | ICD-10-CM

## 2021-04-11 HISTORY — DX: Unspecified infectious disease: B99.9

## 2021-04-11 HISTORY — DX: Other complications of anesthesia, initial encounter: T88.59XA

## 2021-04-11 LAB — CBC WITH DIFFERENTIAL/PLATELET
Abs Immature Granulocytes: 0.02 K/uL (ref 0.00–0.07)
Basophils Absolute: 0 K/uL (ref 0.0–0.1)
Basophils Relative: 0 %
Eosinophils Absolute: 0 K/uL (ref 0.0–0.5)
Eosinophils Relative: 1 %
HCT: 41.4 % (ref 36.0–46.0)
Hemoglobin: 13.7 g/dL (ref 12.0–15.0)
Immature Granulocytes: 1 %
Lymphocytes Relative: 22 %
Lymphs Abs: 0.8 K/uL (ref 0.7–4.0)
MCH: 28.4 pg (ref 26.0–34.0)
MCHC: 33.1 g/dL (ref 30.0–36.0)
MCV: 85.9 fL (ref 80.0–100.0)
Monocytes Absolute: 0.2 K/uL (ref 0.1–1.0)
Monocytes Relative: 6 %
Neutro Abs: 2.7 K/uL (ref 1.7–7.7)
Neutrophils Relative %: 70 %
Platelets: 273 K/uL (ref 150–400)
RBC: 4.82 MIL/uL (ref 3.87–5.11)
RDW: 13.6 % (ref 11.5–15.5)
WBC: 3.8 K/uL — ABNORMAL LOW (ref 4.0–10.5)
nRBC: 0 % (ref 0.0–0.2)

## 2021-04-11 LAB — BLOOD GAS, ARTERIAL
Acid-base deficit: 3.7 mmol/L — ABNORMAL HIGH (ref 0.0–2.0)
Bicarbonate: 19 mmol/L — ABNORMAL LOW (ref 20.0–28.0)
Drawn by: 12507
FIO2: 0.21
O2 Saturation: 100 %
pCO2 arterial: 29.1 mmHg — ABNORMAL LOW (ref 32.0–48.0)
pH, Arterial: 7.431 (ref 7.350–7.450)
pO2, Arterial: 103 mmHg (ref 83.0–108.0)

## 2021-04-11 LAB — COMPREHENSIVE METABOLIC PANEL WITH GFR
ALT: 9 U/L (ref 0–44)
AST: 11 U/L — ABNORMAL LOW (ref 15–41)
Albumin: 2.9 g/dL — ABNORMAL LOW (ref 3.5–5.0)
Alkaline Phosphatase: 65 U/L (ref 38–126)
Anion gap: 8 (ref 5–15)
BUN: 7 mg/dL (ref 6–20)
CO2: 20 mmol/L — ABNORMAL LOW (ref 22–32)
Calcium: 8.7 mg/dL — ABNORMAL LOW (ref 8.9–10.3)
Chloride: 107 mmol/L (ref 98–111)
Creatinine, Ser: 0.39 mg/dL — ABNORMAL LOW (ref 0.44–1.00)
GFR, Estimated: >60 mL/min
Glucose, Bld: 208 mg/dL — ABNORMAL HIGH (ref 70–99)
Potassium: 3.4 mmol/L — ABNORMAL LOW (ref 3.5–5.1)
Sodium: 135 mmol/L (ref 135–145)
Total Bilirubin: 0.6 mg/dL (ref 0.3–1.2)
Total Protein: 6.7 g/dL (ref 6.5–8.1)

## 2021-04-11 LAB — URINALYSIS, ROUTINE W REFLEX MICROSCOPIC
Bilirubin Urine: NEGATIVE
Glucose, UA: >=500 mg/dL — AB
Ketones, ur: NEGATIVE mg/dL
Nitrite: NEGATIVE
Protein, ur: 30 mg/dL — AB
Specific Gravity, Urine: 1.033 — ABNORMAL HIGH (ref 1.005–1.030)
WBC, UA: >50 WBC/hpf — ABNORMAL HIGH (ref 0–5)
pH: 5 (ref 5.0–8.0)

## 2021-04-11 LAB — GLUCOSE, CAPILLARY
Glucose-Capillary: 207 mg/dL — ABNORMAL HIGH (ref 70–99)
Glucose-Capillary: 113 mg/dL — ABNORMAL HIGH (ref 70–99)
Glucose-Capillary: 117 mg/dL — ABNORMAL HIGH (ref 70–99)
Glucose-Capillary: 124 mg/dL — ABNORMAL HIGH (ref 70–99)
Glucose-Capillary: 122 mg/dL — ABNORMAL HIGH (ref 70–99)
Glucose-Capillary: 96 mg/dL (ref 70–99)
Glucose-Capillary: 115 mg/dL — ABNORMAL HIGH (ref 70–99)
Glucose-Capillary: 123 mg/dL — ABNORMAL HIGH (ref 70–99)
Glucose-Capillary: 78 mg/dL (ref 70–99)

## 2021-04-11 LAB — RESP PANEL BY RT-PCR (FLU A&B, COVID) ARPGX2
Influenza A by PCR: NEGATIVE
Influenza B by PCR: NEGATIVE
SARS Coronavirus 2 by RT PCR: NEGATIVE

## 2021-04-11 LAB — TYPE AND SCREEN
ABO/RH(D): A NEG
Antibody Screen: NEGATIVE

## 2021-04-11 LAB — HEMOGLOBIN A1C
Hgb A1c MFr Bld: 9.8 % — ABNORMAL HIGH (ref 4.8–5.6)
Mean Plasma Glucose: 234.56 mg/dL

## 2021-04-11 LAB — LIPASE, BLOOD: Lipase: 21 U/L (ref 11–51)

## 2021-04-11 IMAGING — US US MFM OB DETAIL+14 WK
1 series · 13 of 28 positions shown · non-contrast
Comparison: none

[Series 1: us mfm ob detail+14 wk · 90 acquisitions, 13 frames shown]
[im 4/90]
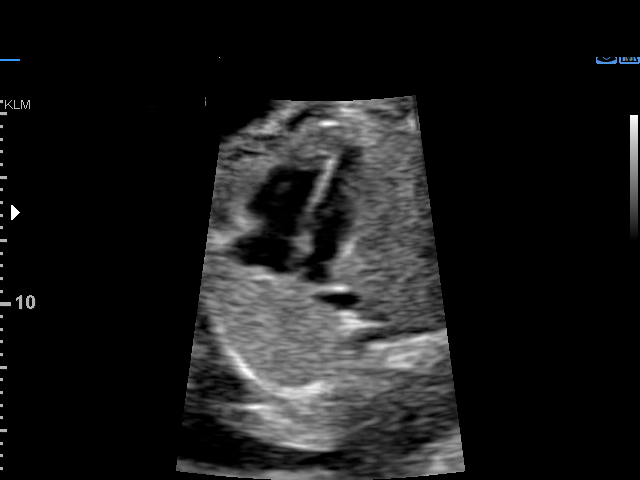
[im 10/90]
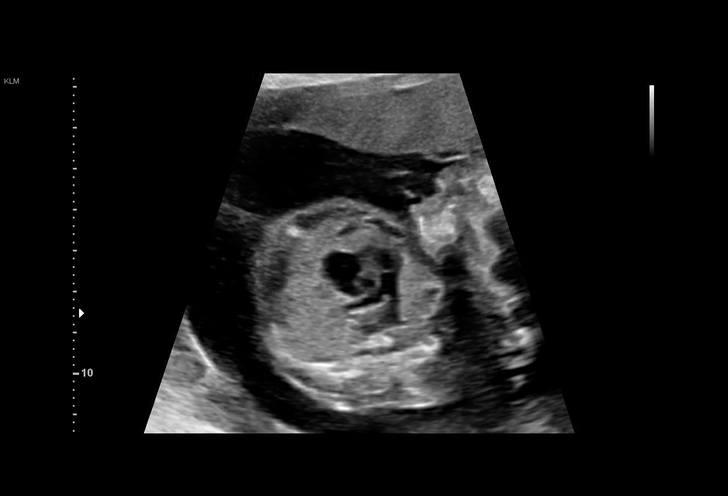
[im 17/90]
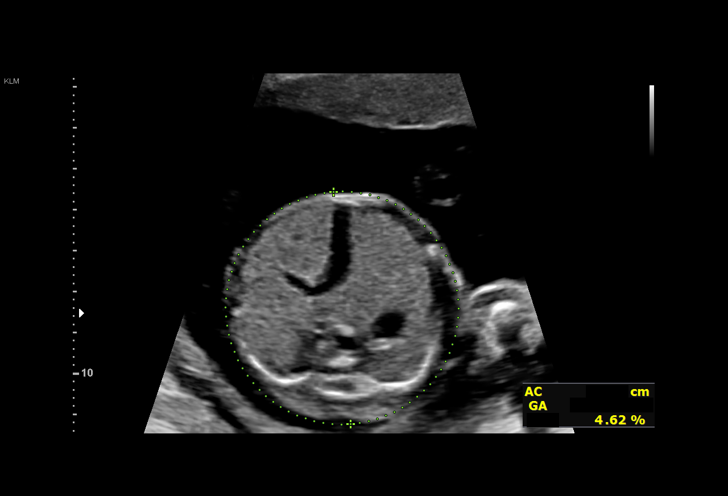
[im 24/90]
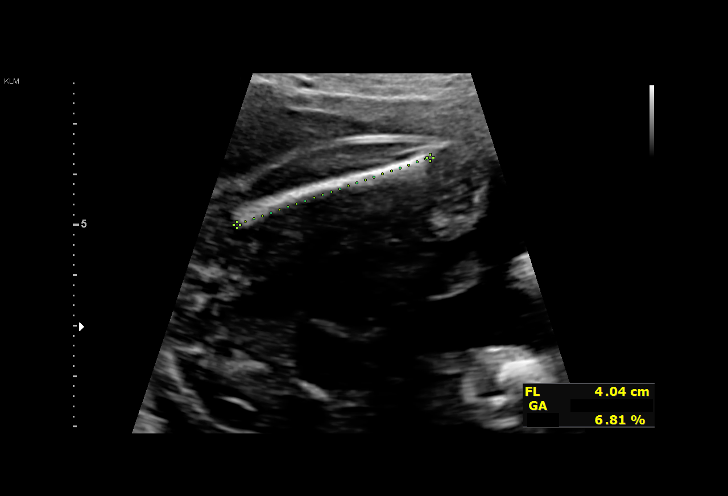
[im 30/90]
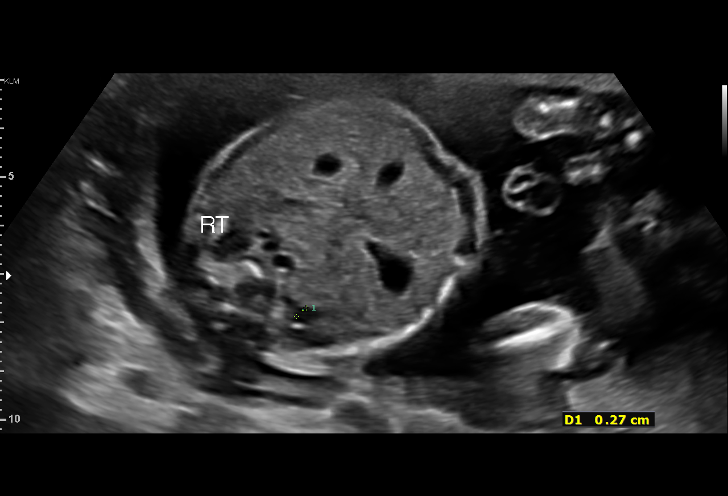
[im 37/90]
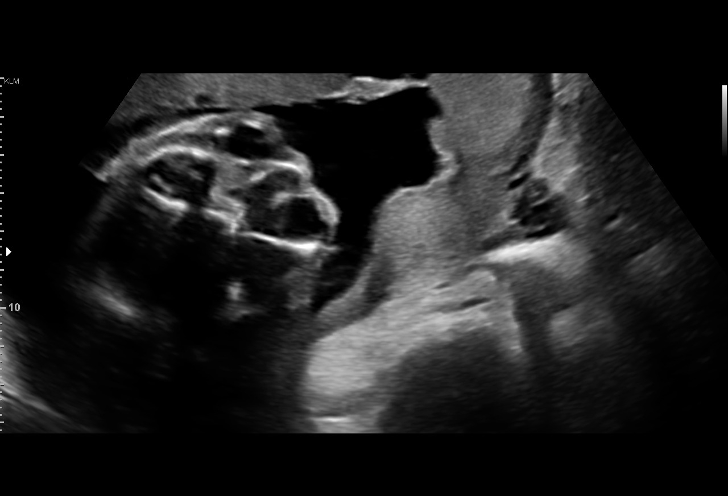
[im 47/90]
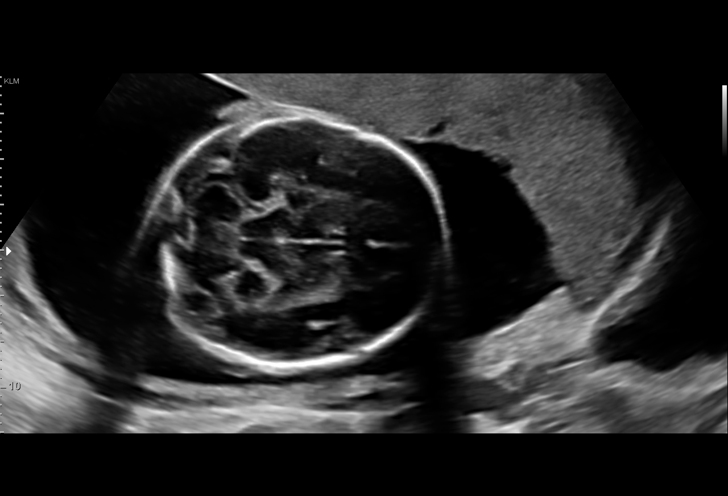
[im 53/90]
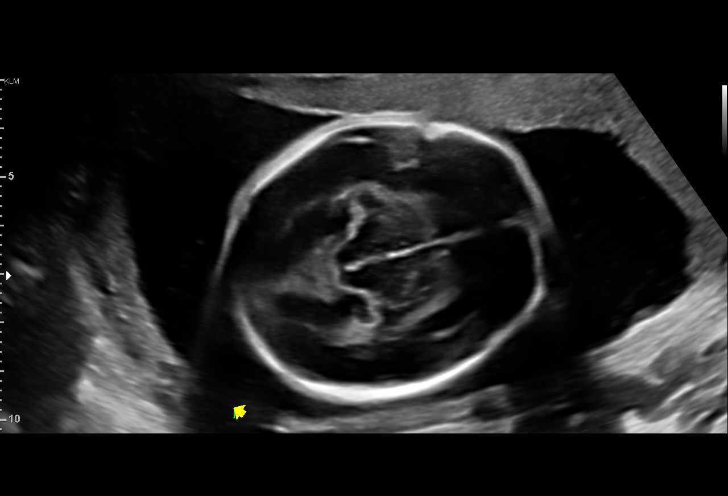
[im 60/90]
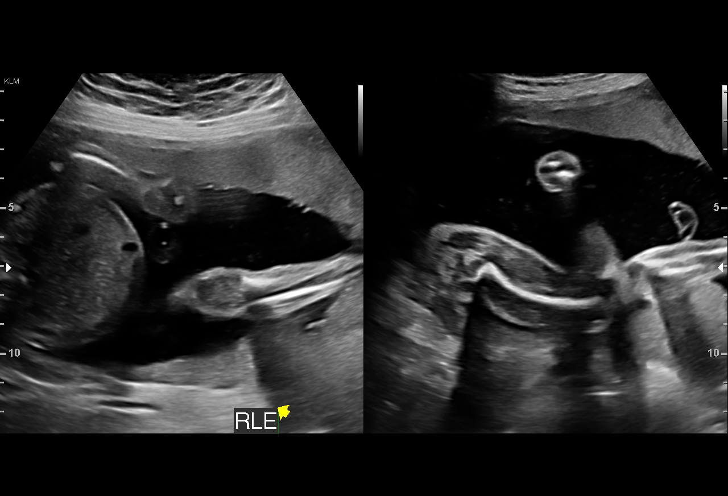
[im 66/90]
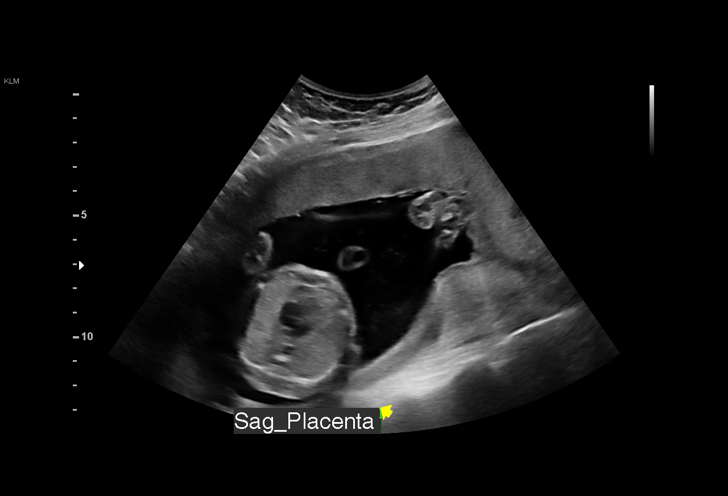
[im 73/90]
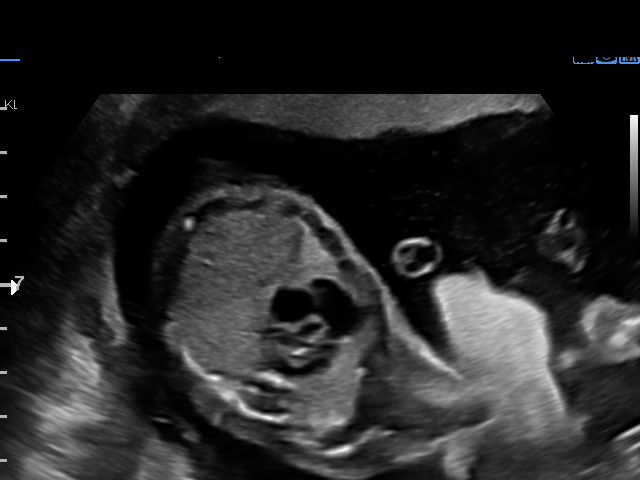
[im 80/90]
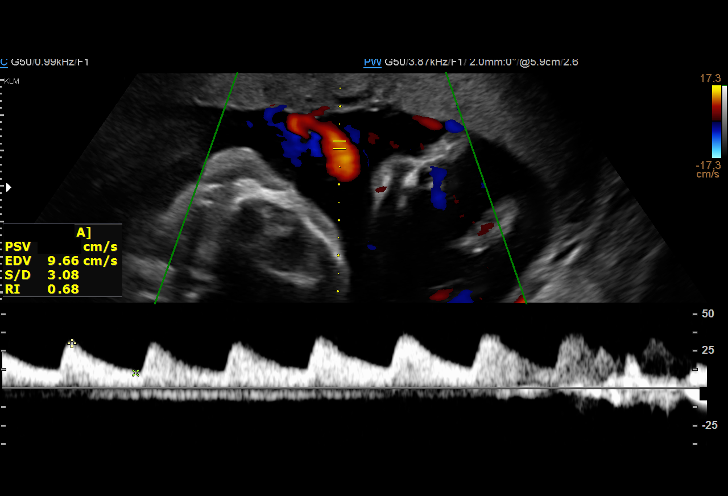
[im 86/90]
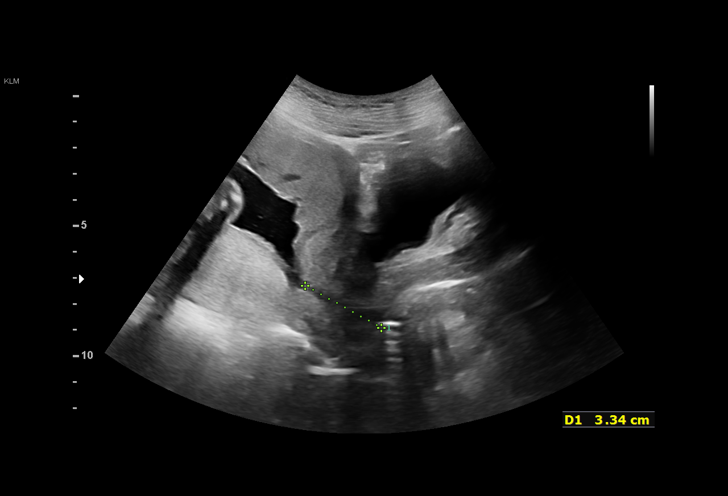

[13 of 28 positions shown; findings below may reference images not displayed]

VELES

                                                            GYN
                                                            Care
                   VELES DO                                  [HOSPITAL]

Indications

 Pre-existing diabetes, type 1, in pregnancy,   [2Y]
 second trimester
 Maternal care for known or suspected poor      [2Y]
 fetal growth, second trimester, not applicable
 or unspecified IUGR
 24 weeks gestation of pregnancy
 Encounter for antenatal screening for          [2Y]
 malformations
 Insufficient Prenatal Care (transferred from   [2Y]
 [place]
Fetal Evaluation

 Num Of Fetuses:         1
 Cardiac Activity:       Observed
 Presentation:           Cephalic
 Placenta:               Anterior
 P. Cord Insertion:      Visualized

 Amniotic Fluid
 AFI FV:      Within normal limits
Biometry

 BPD:      55.3  mm     G. Age:  22w 6d        4.2  %    CI:        76.17   %    70 - 86
                                                         FL/HC:      19.8   %    18.7 -
 HC:      200.8  mm     G. Age:  22w 2d        < 1  %    HC/AC:      1.14        1.05 -
 AC:      175.7  mm     G. Age:  22w 3d        3.1  %    FL/BPD:     72.0   %    71 - 87
 FL:       39.8  mm     G. Age:  22w 6d        4.4  %    FL/AC:      22.7   %    20 - 24
 HUM:      36.9  mm     G. Age:  22w 6d          5  %
 CER:        26  mm     G. Age:  23w 4d         35  %
 Est. FW:     519  gm      1 lb 2 oz    1.5  %
OB History

 Gravidity:    2         Term:   0        Prem:   0        SAB:   1
 TOP:          0       Ectopic:  0        Living: 0
Gestational Age

 LMP:           24w 3d        Date:  [DATE]                 EDD:   [DATE]
 U/S Today:     22w 4d                                        EDD:   [DATE]
 Best:          24w 3d     Det. By:  LMP  ([DATE])          EDD:   [DATE]
Anatomy

 Cranium:               Appears normal         LVOT:                   Appears normal
 Cavum:                 Appears normal         Aortic Arch:            Appears normal
 Ventricles:            Appears normal         Diaphragm:              Appears normal
 Choroid Plexus:        Appears normal         Stomach:                Appears normal, left
                                                                       sided
 Cerebellum:            Appears normal         Abdomen:                Appears normal
 Posterior Fossa:       Appears normal         Abdominal Wall:         Appears nml (cord
                                                                       insert, abd wall)
 Nuchal Fold:           Not applicable (>20    Cord Vessels:           Appears normal (3
                        wks GA)                                        vessel cord)
 Face:                  Appears normal         Kidneys:                Appear normal
                        (orbits and profile)
 Lips:                  Appears normal         Bladder:                Appears normal
 Thoracic:              Appears normal         Spine:                  Not well visualized
 Heart:                 Appears normal         Upper Extremities:      Appears normal
                        (4CH, axis, and
                        situs)
 RVOT:                  Appears normal         Lower Extremities:      Appears normal

 Other:  Fetus appears to be female. Heels visualized. Nasal bone visualized.
         Hands not well visualized. Technically difficult due to fetal position.
Doppler - Fetal Vessels

 Umbilical Artery
  S/D     %tile      RI    %tile      PI    %tile            ADFV    RDFV
  2.83       20    0.65       23    0.87        7               No      No

Cervix Uterus Adnexa

 Cervix
 Not visualized (advanced GA >[2Y])

 Adnexa
 No abnormality visualized.
Comments

 Single intrauterine pregnancy here for a detailed anatomy
 due late transfer of care with dating based on ultrasound from
 [place]
 Normal anatomy with measurements consistent with dates.
 Today ultrasound was 13 day difference from LMP which is
 sure.

 There is good fetal movement and amniotic fluid volume
 Suboptimal views of the fetal anatomy were obtained
 secondary to fetal position and advanced gestational age.

 The PA appears prominent which is just below the 95th% for
 this gestational age.

 At this time we recommend follow up growth and dating in 4
 weeks. Reassess the pulmonary artery if still enlarged
 consider fetal echocardiogram.

 Secondy if dates are more consistent with today's
 examination consider adjusting EDD to today's exam.

## 2021-04-11 MED ORDER — FAMOTIDINE IN NACL 20-0.9 MG/50ML-% IV SOLN
20.0000 mg | Freq: Two times a day (BID) | INTRAVENOUS | Status: DC
  Administered 2021-04-11 – 2021-04-12 (×3): 20 mg via INTRAVENOUS
  Filled 2021-04-11 (×5): qty 50

## 2021-04-11 MED ORDER — METOCLOPRAMIDE HCL 5 MG/ML IJ SOLN
10.0000 mg | Freq: Four times a day (QID) | INTRAMUSCULAR | Status: DC
  Administered 2021-04-11 – 2021-04-13 (×7): 10 mg via INTRAVENOUS
  Filled 2021-04-11 (×7): qty 2

## 2021-04-11 MED ORDER — LACTATED RINGERS IV BOLUS
1000.0000 mL | Freq: Once | INTRAVENOUS | Status: AC
  Administered 2021-04-11: 1000 mL via INTRAVENOUS

## 2021-04-11 MED ORDER — SODIUM CHLORIDE 0.9 % IV SOLN
INTRAVENOUS | Status: DC | PRN
  Administered 2021-04-11: 250 mL via INTRAVENOUS

## 2021-04-11 MED ORDER — DEXTROSE 50 % IV SOLN: 0.0000 mL | INTRAVENOUS | Status: DC | PRN

## 2021-04-11 MED ORDER — INSULIN ASPART 100 UNIT/ML IJ SOLN: 0.0000 [IU] | Freq: Three times a day (TID) | INTRAMUSCULAR | Status: DC

## 2021-04-11 MED ORDER — DEXTROSE IN LACTATED RINGERS 5 % IV SOLN: INTRAVENOUS | Status: DC

## 2021-04-11 MED ORDER — ONDANSETRON HCL 4 MG/2ML IJ SOLN
4.0000 mg | Freq: Once | INTRAMUSCULAR | Status: AC
  Administered 2021-04-11: 4 mg via INTRAVENOUS
  Filled 2021-04-11: qty 2

## 2021-04-11 MED ORDER — CALCIUM CARBONATE ANTACID 500 MG PO CHEW
2.0000 | CHEWABLE_TABLET | ORAL | Status: DC | PRN
  Administered 2021-04-11: 400 mg via ORAL
  Filled 2021-04-11: qty 2

## 2021-04-11 MED ORDER — ACETAMINOPHEN 325 MG PO TABS
650.0000 mg | ORAL_TABLET | ORAL | Status: DC | PRN
  Administered 2021-04-11: 650 mg via ORAL
  Filled 2021-04-11: qty 2

## 2021-04-11 MED ORDER — PRENATAL MULTIVITAMIN CH
1.0000 | ORAL_TABLET | Freq: Every day | ORAL | Status: DC
  Administered 2021-04-12 – 2021-04-13 (×2): 1 via ORAL
  Filled 2021-04-11 (×2): qty 1

## 2021-04-11 MED ORDER — INSULIN ASPART 100 UNIT/ML IJ SOLN
3.0000 [IU] | Freq: Three times a day (TID) | INTRAMUSCULAR | Status: DC
  Administered 2021-04-12: 3 [IU] via SUBCUTANEOUS

## 2021-04-11 MED ORDER — INSULIN NPH (HUMAN) (ISOPHANE) 100 UNIT/ML ~~LOC~~ SUSP
6.0000 [IU] | Freq: Two times a day (BID) | SUBCUTANEOUS | Status: DC
  Administered 2021-04-11 – 2021-04-13 (×4): 6 [IU] via SUBCUTANEOUS
  Filled 2021-04-11: qty 10

## 2021-04-11 MED ORDER — LACTATED RINGERS IV SOLN: INTRAVENOUS | Status: DC

## 2021-04-11 MED ORDER — ONDANSETRON HCL 4 MG/2ML IJ SOLN
4.0000 mg | Freq: Four times a day (QID) | INTRAMUSCULAR | Status: DC | PRN
  Administered 2021-04-11: 4 mg via INTRAVENOUS
  Filled 2021-04-11: qty 2

## 2021-04-11 MED ORDER — FAMOTIDINE IN NACL 20-0.9 MG/50ML-% IV SOLN
20.0000 mg | Freq: Once | INTRAVENOUS | Status: AC
  Administered 2021-04-11: 20 mg via INTRAVENOUS
  Filled 2021-04-11: qty 50

## 2021-04-11 MED ORDER — SODIUM CHLORIDE 0.9 % IV SOLN
1.0000 g | INTRAVENOUS | Status: DC
  Administered 2021-04-11 – 2021-04-12 (×2): 1 g via INTRAVENOUS
  Filled 2021-04-11 (×2): qty 1
  Filled 2021-04-11: qty 10

## 2021-04-11 MED ORDER — DOCUSATE SODIUM 100 MG PO CAPS
100.0000 mg | ORAL_CAPSULE | Freq: Every day | ORAL | Status: DC
  Administered 2021-04-12 – 2021-04-13 (×2): 100 mg via ORAL
  Filled 2021-04-11 (×2): qty 1

## 2021-04-11 MED ORDER — INSULIN REGULAR(HUMAN) IN NACL 100-0.9 UT/100ML-% IV SOLN
INTRAVENOUS | Status: DC
  Administered 2021-04-11: 0.9 [IU]/h via INTRAVENOUS
  Filled 2021-04-11 (×2): qty 100

## 2021-04-11 NOTE — H&P (Signed)
Kristin Clay is a 35 y.o. female G2P0010 _0 .3 wks by LMP w/hx of T1DM presenting with N/V x5 days and abd pain. Reports she is unable to tolerate any food or fluids. Denies fevers, diarrhea, or sick contacts. Abd pain is in the epigastric region and constant. Rates pain 7/10. Also reports back pain, lower and upper, bilateral. Endorses dysuria but denies other urinary sx. Has not taken Insulin in 5 days. Reports last CBG was 280 about 4 days ago. Was receiving prenatal care in [place]. Admitted for DKA and pyelo last December. Denies LAP, VB, or LOF.  Dating: By LMP --->  Estimated Date of Delivery: 07/29/21  Prenatal History/Complications: -[id number] - AMA - language barrier  Past Medical History: Past Medical History:  Diagnosis Date  . Complication of anesthesia    with hand surgery, local anes did not work, tried twice- had to put her to sleep  . Diabetes mellitus type 1 ([facility])   . DKA (diabetic ketoacidosis) ([facility]) 10/2020  . Infection    UTI  . Pyelonephritis 10/2020  . Type I diabetes mellitus ([facility])    dx 69yr ago    Past Surgical History: Past Surgical History:  Procedure Laterality Date  . female circumcision Bilateral    clitorectomy as well  . FOOT SURGERY  2018  . FOOT SURGERY    . HAND SURGERY  2019  . HAND SURGERY      Obstetrical History: OB History    Gravida  2   Para  0   Term  0   Preterm  0   AB  1   Living        SAB  1   IAB  0   Ectopic  0   Multiple      Live Births              Social History: Social History   Socioeconomic History  . Marital status: Married    Spouse name: Not on file  . Number of children: Not on file  . Years of education: Not on file  . Highest education level: Not on file  Occupational History  . Not on file  Tobacco Use  . Smoking status: Never Smoker  . Smokeless tobacco: Never Used  Vaping Use  . Vaping Use: Never used  Substance and Sexual  Activity  . Alcohol use: Never  . Drug use: Never  . Sexual activity: Yes    Birth control/protection: None  Other Topics Concern  . Not on file  Social History Narrative   ** Merged History Encounter **       ** Merged History Encounter **       Social Determinants of Health   Financial Resource Strain: Not on file  Food Insecurity: Not on file  Transportation Needs: Not on file  Physical Activity: Not on file  Stress: Not on file  Social Connections: Not on file    Family History: Family History  Problem Relation Age of Onset  . Hyperlipidemia Mother   . Hypertension Mother   . Kidney disease Father   . Alzheimer's disease Father     Allergies: Allergies  Allergen Reactions  . Pork-Derived Products     Pt does not want any pork products    Medications Prior to Admission  Medication Sig Dispense Refill Last Dose  . acetaminophen (TYLENOL) 500 MG tablet Take 1,000 mg by mouth every 6 (six) hours as needed  for mild pain.   Past Week at Unknown time  . Cyanocobalamin (VITAMIN B 12 PO) Take by mouth.   04/10/2021 at Unknown time  . folic acid (FOLVITE) 1 MG tablet Take 1 mg by mouth daily.   04/10/2021 at Unknown time  . metoCLOPramide (REGLAN) 10 MG tablet Take 1 tablet (10 mg total) by mouth every 6 (six) hours. 20 tablet 0 Past Month at Unknown time  . ondansetron (ZOFRAN-ODT) 4 MG disintegrating tablet Take 1 tablet (4 mg total) by mouth every 8 (eight) hours as needed for nausea or vomiting. 8 tablet 0 Past Month at Unknown time  . Prenatal Vit-Fe Fumarate-FA (PRENATAL VITAMIN PO) Take by mouth.   04/10/2021 at Unknown time  . blood glucose meter kit and supplies Dispense based on patient and insurance preference. Use up to four times daily as directed. (FOR ICD-10 E10.9, E11.9). 1 each 0   . glucose blood test strip Use as instructed QID 100 each 12   . insulin detemir (LEVEMIR) 100 UNIT/ML injection Inject 20-40 Units into the skin See admin instructions. 40 units  every morning and 20 units every night (Patient not taking: Reported on 04/11/2021)   Not Taking at Unknown time  . insulin NPH Human (NOVOLIN N) 100 UNIT/ML injection 30 units before breakfast & 20 before dinner (Patient not taking: Reported on 04/11/2021) 10 mL 3 Not Taking at Unknown time  . insulin NPH-regular Human (NOVOLIN 70/30) (70-30) 100 UNIT/ML injection 42 units subq Q Am and 22 units subq Q PM (Patient taking differently: 25units subq Q Am and 20units subq Q PM) 10 mL 3   . insulin regular (NOVOLIN R) 100 units/mL injection 15 units before breakfast & 10 units before dinner (Patient not taking: No sig reported) 10 mL 11   . Insulin Syringe-Needle U-100 30G X 15/64" 0.5 ML MISC 1 Device by Does not apply route 2 (two) times daily. 100 each 5   . lisinopril (ZESTRIL) 20 MG tablet Take 1 tablet (20 mg total) by mouth daily. 30 tablet 2   . OneTouch Delica Lancets 15A MISC 1 Device by Does not apply route in the morning, at noon, in the evening, and at bedtime. 100 each 2    Review of Systems:  All systems reviewed and negative except as stated in HPI  PE: Blood pressure 114/75, pulse 80, temperature 98.3 F (36.8 C), temperature source Oral, resp. rate 16, weight 48.3 kg, last menstrual period 10/22/2020, SpO2 100 %, unknown if currently breastfeeding. General appearance: alert, cooperative and no distress Lungs: regular rate and effort Heart: regular rate  Abdomen: soft, non-tender; no CVAT Extremities: Homans sign is negative, no sign of DVT EFM: 145 bpm, mod variability, no accels, occ variable decels Toco: none   Prenatal Transfer Tool  Maternal Diabetes: Yes:  Diabetes Type:  Pre-pregnancy Genetic Screening: n/a Maternal Ultrasounds/Referrals: none Fetal Ultrasounds or other Referrals: none Maternal Substance Abuse:  No Significant Maternal Medications:  Meds include: Other: Insulin Significant Maternal Lab Results: None  Results for orders placed or performed during the  hospital encounter of 04/11/21 (from the past 24 hour(s))  Urinalysis, Routine w reflex microscopic Urine, Clean Catch   Collection Time: 04/11/21  9:08 AM  Result Value Ref Range   Color, Urine YELLOW YELLOW   APPearance CLOUDY (A) CLEAR   Specific Gravity, Urine 1.033 (H) 1.005 - 1.030   pH 5.0 5.0 - 8.0   Glucose, UA >=500 (A) NEGATIVE mg/dL   Hgb urine dipstick SMALL (  A) NEGATIVE   Bilirubin Urine NEGATIVE NEGATIVE   Ketones, ur NEGATIVE NEGATIVE mg/dL   Protein, ur 30 (A) NEGATIVE mg/dL   Nitrite NEGATIVE NEGATIVE   Leukocytes,Ua MODERATE (A) NEGATIVE   RBC / HPF 21-50 0 - 5 RBC/hpf   WBC, UA >50 (H) 0 - 5 WBC/hpf   Bacteria, UA RARE (A) NONE SEEN   Squamous Epithelial / LPF 6-10 0 - 5   Mucus PRESENT    Budding Yeast PRESENT    Hyphae Yeast PRESENT   Glucose, capillary   Collection Time: 04/11/21  9:55 AM  Result Value Ref Range   Glucose-Capillary 207 (H) 70 - 99 mg/dL  Blood gas, arterial   Collection Time: 04/11/21 10:30 AM  Result Value Ref Range   FIO2 0.21    pH, Arterial 7.431 7.350 - 7.450   pCO2 arterial 29.1 (L) 32.0 - 48.0 mmHg   pO2, Arterial 103 83.0 - 108.0 mmHg   Bicarbonate 19.0 (L) 20.0 - 28.0 mmol/L   Acid-base deficit 3.7 (H) 0.0 - 2.0 mmol/L   O2 Saturation 100.0 %   Collection site RADIAL    Drawn by [id number]    Sample type ARTERIAL    Allens test (pass/fail) PASS PASS  CBC with Differential/Platelet   Collection Time: 04/11/21 10:49 AM  Result Value Ref Range   WBC 3.8 (L) 4.0 - 10.5 K/uL   RBC 4.82 3.87 - 5.11 MIL/uL   Hemoglobin 13.7 12.0 - 15.0 g/dL   HCT 41.4 36.0 - 46.0 %   MCV 85.9 80.0 - 100.0 fL   MCH 28.4 26.0 - 34.0 pg   MCHC 33.1 30.0 - 36.0 g/dL   RDW 13.6 11.5 - 15.5 %   Platelets 273 150 - 400 K/uL   nRBC 0.0 0.0 - 0.2 %   Neutrophils Relative % 70 %   Neutro Abs 2.7 1.7 - 7.7 K/uL   Lymphocytes Relative 22 %   Lymphs Abs 0.8 0.7 - 4.0 K/uL   Monocytes Relative 6 %   Monocytes Absolute 0.2 0.1 - 1.0 K/uL   Eosinophils  Relative 1 %   Eosinophils Absolute 0.0 0.0 - 0.5 K/uL   Basophils Relative 0 %   Basophils Absolute 0.0 0.0 - 0.1 K/uL   Immature Granulocytes 1 %   Abs Immature Granulocytes 0.02 0.00 - 0.07 K/uL  Hemoglobin A1c   Collection Time: 04/11/21 10:49 AM  Result Value Ref Range   Hgb A1c MFr Bld 9.8 (H) 4.8 - 5.6 %   Mean Plasma Glucose 234.56 mg/dL  Type and screen [facility]   Collection Time: 04/11/21 10:49 AM  Result Value Ref Range   ABO/RH(D) PENDING    Antibody Screen PENDING    Sample Expiration      04/14/2021,2359 Performed at [facility], [address] [place], [place] [address]     Patient Active Problem List   Diagnosis Date Noted  . DKA, type 1 ([facility]) 10/24/2020  . Acute pyelonephritis 10/24/2020  . Type 1 diabetes mellitus with ketoacidosis, uncontrolled ([facility]) 02/22/2020  . Language barrier 01/11/2020  . 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 DKA (diabetic ketoacidoses) 10/27/2019  . Nausea & vomiting 10/27/2019  . Acute lower UTI 10/27/2019  . Syncope     Assessment: [redacted] weeks gestation T1DM Hyperglycemia Dysuria   Plan: Admit to [facility]SC unit Insulin Rocephin Mngt per Dr. Matilde Bash, CNM  04/11/2021, 11:35 AM

## 2021-04-11 NOTE — Plan of Care (Signed)
  Problem: Education: Goal: Knowledge of General Education information will improve Description: Including pain rating scale, medication(s)/side effects and non-pharmacologic comfort measures Outcome: Completed/Met

## 2021-04-11 NOTE — MAU Note (Signed)
Not checking blood sugar, not watching diet because of vomiting, has not taken insulin in 5 days

## 2021-04-11 NOTE — Progress Notes (Addendum)
Inpatient Diabetes Program Recommendations  AACE/ADA: New Consensus Statement on Inpatient Glycemic Control (2015)  Target Ranges:  Prepandial:   less than 140 mg/dL      Peak postprandial:   less than 180 mg/dL (1-2 hours)      Critically ill patients:  140 - 180 mg/dL   Lab Results  Component Value Date   GLUCAP 207 (H) 04/11/2021   HGBA1C 9.8 (H) 04/11/2021    Review of Glycemic Control Results for Kristin Clay, Kristin Clay (MRN [id number]) as of 04/11/2021 13:26  Ref. Range 04/11/2021 09:55  Glucose-Capillary Latest Ref Range: 70 - 99 mg/dL 546 (H)   Diabetes history:  T1DM Outpatient Diabetes medications:  70/30 25 units QAM & 20 units QPM   Spoke with patient at bedside.  She states she has type 1 diabetes and was diagnosed at age 87.  She has been in [place] for the last 5 months and [place] the states 5 [place] ago.  She states she lives with her husband.  She has not taken her insulin for 5 days.  Denies difficulty obtaining insulin.  She says she stopped taking her insulin because it makes her hungry.  She told the MAU RN she stopped taking it because she was vomiting.  It is interesting that she has type 1 DM and has not had insulin in 5 days and is not in DKA.  She has a meter at home and has not been checking her blood sugar.  Current CBG is 207.   Spoke with Dr. Charlotta Newton and Ileene Patrick will be started.  This will help with recommendations on dosing for SQ insulin.    It appears she does not have insurance.  Will place [facility] consult for assistance at DC with basal bolus insulin as 70/30 is not ideal during pregnancy.  We discussed hypoglycemia.  She is aware of signs, symptoms and treatments.  States she has only been low 1-2 times that she can remember.  Educated her on diet during pregnancy.  She rotates injection site when she administers insulin.    Will check back in a few hours and add recommendations for SQ insulin.    Addendum @ 1438:  Please consider:  1-NPH 6 units BID  (give 1st dose 1-2 hours prior to discontinuing IV insulin) 2-Novolog 0-14 units TID 2 hours after meals 3-Novolog 3 units TID with meals once eating   Will continue to follow while inpatient.  Thank you, Dulce Sellar, RN, BSN Diabetes Coordinator Inpatient Diabetes Program [phone] (team pager from 8a-5p)

## 2021-04-11 NOTE — MAU Note (Signed)
preg confirmed at [facility], has paperwork with her. Will make copy and scan for record.  Pt complaining of ongoing vomiting, every time she eats, she throws up. Having pain in upper abd, burning pain- constant.  Pt is a diabetic.eports LMP of 11/30, was receiving care in [place], just arrived 5/15

## 2021-04-12 DIAGNOSIS — Z3A24 24 weeks gestation of pregnancy: Secondary | ICD-10-CM

## 2021-04-12 DIAGNOSIS — O219 Vomiting of pregnancy, unspecified: Secondary | ICD-10-CM

## 2021-04-12 DIAGNOSIS — O24012 Pre-existing diabetes mellitus, type 1, in pregnancy, second trimester: Secondary | ICD-10-CM

## 2021-04-12 DIAGNOSIS — E1065 Type 1 diabetes mellitus with hyperglycemia: Secondary | ICD-10-CM

## 2021-04-12 LAB — BASIC METABOLIC PANEL
Anion gap: 6 (ref 5–15)
BUN: <5 mg/dL — ABNORMAL LOW (ref 6–20)
CO2: 21 mmol/L — ABNORMAL LOW (ref 22–32)
Calcium: 8 mg/dL — ABNORMAL LOW (ref 8.9–10.3)
Chloride: 108 mmol/L (ref 98–111)
Creatinine, Ser: 0.37 mg/dL — ABNORMAL LOW (ref 0.44–1.00)
GFR, Estimated: >60 mL/min (ref >60)
Glucose, Bld: 107 mg/dL — ABNORMAL HIGH (ref 70–99)
Potassium: 3.2 mmol/L — ABNORMAL LOW (ref 3.5–5.1)
Sodium: 135 mmol/L (ref 135–145)

## 2021-04-12 LAB — GLUCOSE, CAPILLARY
Glucose-Capillary: 96 mg/dL (ref 70–99)
Glucose-Capillary: 107 mg/dL — ABNORMAL HIGH (ref 70–99)
Glucose-Capillary: 75 mg/dL (ref 70–99)
Glucose-Capillary: 66 mg/dL — ABNORMAL LOW (ref 70–99)
Glucose-Capillary: 68 mg/dL — ABNORMAL LOW (ref 70–99)
Glucose-Capillary: 101 mg/dL — ABNORMAL HIGH (ref 70–99)
Glucose-Capillary: 104 mg/dL — ABNORMAL HIGH (ref 70–99)

## 2021-04-12 LAB — CULTURE, OB URINE

## 2021-04-12 MED ORDER — POTASSIUM CHLORIDE CRYS ER 20 MEQ PO TBCR
20.0000 meq | EXTENDED_RELEASE_TABLET | Freq: Two times a day (BID) | ORAL | Status: DC
  Administered 2021-04-12 – 2021-04-13 (×3): 20 meq via ORAL
  Filled 2021-04-12 (×3): qty 1

## 2021-04-12 NOTE — Progress Notes (Signed)
  Consult for diet education acknowledged. RDN will complete consult when back in the hospital on Monday 5/23  [facility] M.Odis Luster LDN Neonatal Nutrition Support Specialist/RD III

## 2021-04-12 NOTE — Progress Notes (Signed)
Patient ID: Kristin Clay, female   DOB: [birth year], 35 y.o.   MRN: [id number] [facility] ANTEPARTUM COMPREHENSIVE PROGRESS NOTE  Kristin Clay is a 35 y.o. G2P0010 at [redacted]w[redacted]d  who is admitted for glycemic control and N/v.   Fetal presentation is unsure. Length of Stay:  0  Days  Subjective: Pt reports feeling better today. Still with some N/V. Tolerating a little of an ADA diet. Patient reports good fetal movement.  She reports no uterine contractions, no bleeding and no loss of fluid per vagina.  Vitals:  Blood pressure 113/77, pulse 76, temperature 98.1 F (36.7 C), temperature source Oral, resp. rate 18, weight 48.3 kg, last menstrual period 10/22/2020, SpO2 100 %, unknown if currently breastfeeding. Physical Examination: Lungs clear Heart RRR Abd soft + BS gravid Ext non tender  Fetal Monitoring:  Baseline: 130's bpm  Labs:  Results for orders placed or performed during the hospital encounter of 04/11/21 (from the past 24 hour(s))  Glucose, capillary   Collection Time: 04/11/21  1:31 PM  Result Value Ref Range   Glucose-Capillary 117 (H) 70 - 99 mg/dL  Glucose, capillary   Collection Time: 04/11/21  2:04 PM  Result Value Ref Range   Glucose-Capillary 113 (H) 70 - 99 mg/dL  Glucose, capillary   Collection Time: 04/11/21  3:12 PM  Result Value Ref Range   Glucose-Capillary 124 (H) 70 - 99 mg/dL  Glucose, capillary   Collection Time: 04/11/21  4:22 PM  Result Value Ref Range   Glucose-Capillary 122 (H) 70 - 99 mg/dL  Glucose, capillary   Collection Time: 04/11/21  5:26 PM  Result Value Ref Range   Glucose-Capillary 96 70 - 99 mg/dL  Glucose, capillary   Collection Time: 04/11/21  6:31 PM  Result Value Ref Range   Glucose-Capillary 115 (H) 70 - 99 mg/dL  Glucose, capillary   Collection Time: 04/11/21  7:37 PM  Result Value Ref Range   Glucose-Capillary 123 (H) 70 - 99 mg/dL  Glucose, capillary   Collection Time: 04/11/21 11:26 PM  Result  Value Ref Range   Glucose-Capillary 78 70 - 99 mg/dL  Basic metabolic panel   Collection Time: 04/12/21  5:17 AM  Result Value Ref Range   Sodium 135 135 - 145 mmol/L   Potassium 3.2 (L) 3.5 - 5.1 mmol/L   Chloride 108 98 - 111 mmol/L   CO2 21 (L) 22 - 32 mmol/L   Glucose, Bld 107 (H) 70 - 99 mg/dL   BUN <5 (L) 6 - 20 mg/dL   Creatinine, Ser 4.43 (L) 0.44 - 1.00 mg/dL   Calcium 8.0 (L) 8.9 - 10.3 mg/dL   GFR, Estimated >15 >40 mL/min   Anion gap 6 5 - 15  Glucose, capillary   Collection Time: 04/12/21  6:05 AM  Result Value Ref Range   Glucose-Capillary 96 70 - 99 mg/dL  Glucose, capillary   Collection Time: 04/12/21  9:21 AM  Result Value Ref Range   Glucose-Capillary 107 (H) 70 - 99 mg/dL  Glucose, capillary   Collection Time: 04/12/21 11:30 AM  Result Value Ref Range   Glucose-Capillary 75 70 - 99 mg/dL  Glucose, capillary   Collection Time: 04/12/21 12:56 PM  Result Value Ref Range   Glucose-Capillary 66 (L) 70 - 99 mg/dL    Imaging Studies:    NA   Medications:  Scheduled . docusate sodium  100 mg Oral Daily  . insulin NPH Human  6 Units Subcutaneous BID AC &  HS  . metoCLOPramide (REGLAN) injection  10 mg Intravenous Q6H  . potassium chloride  20 mEq Oral BID  . prenatal multivitamin  1 tablet Oral Q1200   I have reviewed the patient's current medications.  ASSESSMENT: IUP 24 4/7 weeks Type 1 DM, uncontrolled N/V ? UTI  PLAN: UC still pending. Continue with Rocephin. Antiemetics as needed. Adjust insulin as need for optimal control Continue routine antenatal care.   Hermina Staggers 04/12/2021,1:19 PM

## 2021-04-12 NOTE — Progress Notes (Addendum)
CSW met with MOB to complete consult for medical assistance on medication. CSW observed MOB resting in bed while RN at bedside.CSW explained role and reason for consult. MOB was pleasant, polite and engaged with CSW.  CSW provided MOB with medical assistance program resources.   Darcus Austin, MSW, LCSW-A Clinical Social Worker [phone]

## 2021-04-12 NOTE — Progress Notes (Signed)
Hypoglycemic Event  CBG: 66  Treatment: Lunch tray given  Symptoms: none  Follow-up CBG: Time:1501 CBG Result:68- MD notified, juice given Follow up CBG 101 at 1538  Possible Reasons for Event:    Comments/MD notified: Dr. Alysia Penna in unit and notified. No new orders. Will recheck CBG 2 hours post prandial.     Tacy Dura

## 2021-04-12 NOTE — Progress Notes (Signed)
ADA Standards of Care 2022 Diabetes in Pregnancy Target Glucose Ranges:  Fasting: 60 - 90 mg/dL Preprandial: 60 - 335 mg/dL 1 hr postprandial: Less than 140mg /dL (from first bite of meal) 2 hr postprandial: Less than 120 mg/dL (from first bit of meal)  Results for Kristin Clay, Kristin Clay (MRN [facility]) as of 04/12/2021 07:30  Ref. Range 04/11/2021 17:26 04/11/2021 18:31 04/11/2021 19:37 04/11/2021 23:26 04/12/2021 06:05  Glucose-Capillary Latest Ref Range: 70 - 99 mg/dL 96 04/14/2021 (H)  6 units NPH @6 :07pm 123 (H) 78 96    Diabetes history:  T1DM Outpatient DM meds: 70/30 25 units QAM & 20 units QPM   Current Orders: NPH 6 units BID      Novolog 0-16 units TID after meals      Novolog 3 units TID with meals    It appears she does not have insurance.  Will place [facility] consult for assistance at DC with basal bolus insulin as 70/30 is not ideal during pregnancy.   MD- Note pt transitioned to SQ Insulin last PM.  CBG 96 this AM  Please note that pt did NOT get NPH 6 unit dose last night at bedtime.  Since NPH held last PM, do not recommend increase of the NPH insulin yet since she does not have the full ordered dose on board.    --Will follow patient during hospitalization--  RN, MSN, CDE Diabetes Coordinator Inpatient Glycemic Control Team Team Pager: [phone] (8a-5p)

## 2021-04-13 LAB — GLUCOSE, CAPILLARY
Glucose-Capillary: 153 mg/dL — ABNORMAL HIGH (ref 70–99)
Glucose-Capillary: 82 mg/dL (ref 70–99)

## 2021-04-13 MED ORDER — INSULIN NPH (HUMAN) (ISOPHANE) 100 UNIT/ML ~~LOC~~ SUSP
6.0000 [IU] | Freq: Two times a day (BID) | SUBCUTANEOUS | 11 refills | Status: DC
Start: 2021-04-13 — End: 2021-05-06

## 2021-04-13 NOTE — Progress Notes (Signed)
Pt's fasting CBG 153. Pt states she had juice at 0330. Provider made aware.

## 2021-04-13 NOTE — Plan of Care (Signed)
  Problem: Health Behavior/Discharge Planning: Goal: Ability to manage health-related needs will improve Outcome: Adequate for Discharge   Problem: Clinical Measurements: Goal: Ability to maintain clinical measurements within normal limits will improve Outcome: Adequate for Discharge Goal: Will remain free from infection Outcome: Adequate for Discharge Goal: Diagnostic test results will improve Outcome: Adequate for Discharge Goal: Respiratory complications will improve Outcome: Adequate for Discharge Goal: Cardiovascular complication will be avoided Outcome: Adequate for Discharge   Problem: Activity: Goal: Risk for activity intolerance will decrease Outcome: Adequate for Discharge   Problem: Nutrition: Goal: Adequate nutrition will be maintained Outcome: Adequate for Discharge   Problem: Coping: Goal: Level of anxiety will decrease Outcome: Adequate for Discharge   Problem: Elimination: Goal: Will not experience complications related to bowel motility Outcome: Adequate for Discharge Goal: Will not experience complications related to urinary retention Outcome: Adequate for Discharge   Problem: Pain Managment: Goal: General experience of comfort will improve Outcome: Adequate for Discharge   Problem: Safety: Goal: Ability to remain free from injury will improve Outcome: Adequate for Discharge   Problem: Skin Integrity: Goal: Risk for impaired skin integrity will decrease Outcome: Adequate for Discharge   Problem: Education: Goal: Knowledge of disease or condition will improve Outcome: Adequate for Discharge Goal: Knowledge of the prescribed therapeutic regimen will improve Outcome: Adequate for Discharge Goal: Individualized Educational Video(s) Outcome: Adequate for Discharge   Problem: Clinical Measurements: Goal: Complications related to the disease process, condition or treatment will be avoided or minimized Outcome: Adequate for Discharge   

## 2021-04-13 NOTE — Progress Notes (Signed)
Ambulance person. Reviewed antenatal discharge instructions with patient regarding medications, when to call MD/go to [facility], signs and symptoms of pre-term labor, highs and lows of blood sugars, when to check blood sugar before giving NPH, kick counts, signs and symptoms of pre-e, and to call on Monday to schedule a one week appointment with OB. Patient verbalized understanding of antenatal discharge instructions, asked appropriate questions,  and demonstrated teach back. Patient left ambulatory in good condition with significant other.

## 2021-04-13 NOTE — Discharge Instructions (Signed)
https://www.diabeteseducator.org/docs/default-source/living-with-diabetes/conquering-the-grocery-store-v1.pdf?[email or web address]">  Carbohydrate Counting for Diabetes Mellitus, Adult Carbohydrate counting is a method of keeping track of how many carbohydrates you eat. Eating carbohydrates naturally increases the amount of sugar (glucose) in the blood. Counting how many carbohydrates you eat improves your blood glucose control, which helps you manage your diabetes. It is important to know how many carbohydrates you can safely have in each meal. This is different for every person. A dietitian can help you make a meal plan and calculate how many carbohydrates you should have at each meal and snack. What foods contain carbohydrates? Carbohydrates are found in the following foods:  Grains, such as breads and cereals.  Dried beans and soy products.  Starchy vegetables, such as potatoes, peas, and corn.  Fruit and fruit juices.  Milk and yogurt.  Sweets and snack foods, such as cake, cookies, candy, chips, and soft drinks.   How do I count carbohydrates in foods? There are two ways to count carbohydrates in food. You can read food labels or learn standard serving sizes of foods. You can use either of the methods or a combination of both. Using the Nutrition Facts label The Nutrition Facts list is included on the labels of almost all packaged foods and beverages in the [place] It includes:  The serving size.  Information about nutrients in each serving, including the grams (g) of carbohydrate per serving. To use the Nutrition Facts:  Decide how many servings you will have.  Multiply the number of servings by the number of carbohydrates per serving.  The resulting number is the total amount of carbohydrates that you will be having. Learning the standard serving sizes of foods When you eat carbohydrate foods that are not packaged or do not include Nutrition Facts on the label, you need to measure the  servings in order to count the amount of carbohydrates.  Measure the foods that you will eat with a food scale or measuring cup, if needed.  Decide how many standard-size servings you will eat.  Multiply the number of servings by 15. For foods that contain carbohydrates, one serving equals 15 g of carbohydrates. ? For example, if you eat 2 cups or 10 oz (300 g) of strawberries, you will have eaten 2 servings and 30 g of carbohydrates (2 servings x 15 g = 30 g).  For foods that have more than one food mixed, such as soups and casseroles, you must count the carbohydrates in each food that is included. The following list contains standard serving sizes of common carbohydrate-rich foods. Each of these servings has about 15 g of carbohydrates:  1 slice of bread.  1 six-inch (15 cm) tortilla.  ? cup or 2 oz (53 g) cooked rice or pasta.   cup or 3 oz (85 g) cooked or canned, drained and rinsed beans or lentils.   cup or 3 oz (85 g) starchy vegetable, such as peas, corn, or squash.   cup or 4 oz (120 g) hot cereal.   cup or 3 oz (85 g) boiled or mashed potatoes, or  or 3 oz (85 g) of a large baked potato.   cup or 4 fl oz (118 mL) fruit juice.  1 cup or 8 fl oz (237 mL) milk.  1 small or 4 oz (106 g) apple.   or 2 oz (63 g) of a medium banana.  1 cup or 5 oz (150 g) strawberries.  3 cups or 1 oz (24 g) popped popcorn. What is an example of   carbohydrate counting? To calculate the number of carbohydrates in this sample meal, follow the steps shown below. Sample meal  3 oz (85 g) chicken breast.  ? cup or 4 oz (106 g) brown rice.   cup or 3 oz (85 g) corn.  1 cup or 8 fl oz (237 mL) milk.  1 cup or 5 oz (150 g) strawberries with sugar-free whipped topping. Carbohydrate calculation 1. Identify the foods that contain carbohydrates: ? Rice. ? Corn. ? Milk. ? Strawberries. 2. Calculate how many servings you have of each food: ? 2 servings rice. ? 1 serving  corn. ? 1 serving milk. ? 1 serving strawberries. 3. Multiply each number of servings by 15 g: ? 2 servings rice x 15 g = 30 g. ? 1 serving corn x 15 g = 15 g. ? 1 serving milk x 15 g = 15 g. ? 1 serving strawberries x 15 g = 15 g. 4. Add together all of the amounts to find the total grams of carbohydrates eaten: ? 30 g + 15 g + 15 g + 15 g = 75 g of carbohydrates total. What are tips for following this plan? Shopping  Develop a meal plan and then make a shopping list.  Buy fresh and frozen vegetables, fresh and frozen fruit, dairy, eggs, beans, lentils, and whole grains.  Look at food labels. Choose foods that have more fiber and less sugar.  Avoid processed foods and foods with added sugars. Meal planning  Aim to have the same amount of carbohydrates at each meal and for each snack time.  Plan to have regular, balanced meals and snacks. Where to find more information  American Diabetes Association: www.diabetes.org  Centers for Disease Control and Prevention: [employer].br Summary  Carbohydrate counting is a method of keeping track of how many carbohydrates you eat.  Eating carbohydrates naturally increases the amount of sugar (glucose) in the blood.  Counting how many carbohydrates you eat improves your blood glucose control, which helps you manage your diabetes.  A dietitian can help you make a meal plan and calculate how many carbohydrates you should have at each meal and snack. This information is not intended to replace advice given to you by your health care provider. Make sure you discuss any questions you have with your health care provider. Document Revised: 11/09/2019 Document Reviewed: 11/10/2019 Elsevier Patient Education  2021 Elsevier Inc.   Blood Glucose Monitoring, Adult Monitoring your blood sugar (glucose) is an important part of managing your diabetes. Blood glucose monitoring involves checking your blood glucose as often as directed and keeping a log  or record of your results over time. Checking your blood glucose regularly and keeping a blood glucose log can:  Help you and your health care provider adjust your diabetes management plan as needed, including your medicines or insulin.  Help you understand how food, exercise, illnesses, and medicines affect your blood glucose.  Let you know what your blood glucose is at any time. You can quickly find out if you have low blood glucose (hypoglycemia) or high blood glucose (hyperglycemia). Your health care provider will set individualized treatment goals for you. Your goals will be based on your age, other medical conditions you have, and how you respond to diabetes treatment. Generally, the goal of treatment is to maintain the following blood glucose levels:  Before meals (preprandial): 80-130 mg/dL (2.7-7.8 mmol/L).  After meals (postprandial): below 180 mg/dL (10 mmol/L).  A1C level: less than 7%. Supplies needed:  Blood  glucose meter.  Test strips for your meter. Each meter has its own strips. You must use the strips that came with your meter.  A needle to prick your finger (lancet). Do not use a lancet more than one time.  A device that holds the lancet (lancing device).  A journal or log book to write down your results. How to check your blood glucose Checking your blood glucose 1. Wash your hands for at least 20 seconds with soap and water. 2. Prick the side of your finger (not the tip) with the lancet. Do not use the same finger consecutively. 3. Gently rub the finger until a small drop of blood appears. 4. Follow instructions that come with your meter for inserting the test strip, applying blood to the strip, and using your blood glucose meter. 5. Write down your result and any notes in your log.   Using alternative sites Some meters allow you to use areas of your body other than your finger (alternative sites) to test your blood. The most common alternative sites are the  forearm, the thigh, and the palm of your hand. Alternative sites may not be as accurate as the fingers because blood flow is slower in those areas. This means that the result you get may be delayed, and it may be different from the result that you would get from your finger. Use the finger only, and do not use alternative sites, if:  You think you have hypoglycemia.  You sometimes do not know that your blood glucose is getting low (hypoglycemia unawareness). General tips and recommendations Blood glucose log  Every time you check your blood glucose, write down your result. Also write down any notes about things that may be affecting your blood glucose, such as your diet and exercise for the day. This information can help you and your health care provider: ? Look for patterns in your blood glucose over time. ? Adjust your diabetes management plan as needed.  Check if your meter allows you to download your records to a computer or if there is an app for the meter. Most glucose meters store a record of glucose readings in the meter.   If you have type 1 diabetes:  Check your blood glucose 4 or more times a day if you are on intensive insulin therapy with multiple daily injections (MDI) or if you are using an insulin pump. Check your blood glucose: ? Before every meal and snack. ? Before bedtime.  Also check your blood glucose: ? If you have symptoms of hypoglycemia. ? After treating low blood glucose. ? Before doing activities that create a risk for injury, like driving or using machinery. ? Before and after exercise. ? Two hours after a meal. ? Occasionally between 2:00 a.m. and 3:00 a.m., as directed.  You may need to check your blood glucose more often, 6-10 times per day, if: ? You have diabetes that is not well controlled. ? You are ill. ? You have a history of severe hypoglycemia. ? You have hypoglycemia unawareness. If you have type 2 diabetes:  Check your blood glucose 2 or  more times a day if you take insulin or other diabetes medicines.  Check your blood glucose 4 or more times a day if you are on intensive insulin therapy. Occasionally, you may also need to check your glucose between 2:00 a.m. and 3:00 a.m., as directed.  Also check your blood glucose: ? Before and after exercise. ? Before doing activities that create  a risk for injury, like driving or using machinery.  You may need to check your blood glucose more often if: ? Your medicine is being adjusted. ? Your diabetes is not well controlled. ? You are ill. General tips  Make sure you always have your supplies with you.  After you use a few boxes of test strips, adjust (calibrate) your blood glucose meter by following instructions that came with your meter.  If you have questions or need help, all blood glucose meters have a 24-hour hotline phone number available that you can call. Also contact your health care provider with questions or concerns you may have. Where to find more information  The American Diabetes Association: www.diabetes.org  The Association of Diabetes Care & Education Specialists: www.diabeteseducator.org Contact a health care provider if:  Your blood glucose is at or above 240 mg/dL (34.7 mmol/L) for 2 days in a row.  You have been sick or have had a fever for 2 days or longer, and you are not getting better.  You have any of the following problems for more than 6 hours: ? You cannot eat or drink. ? You have nausea or vomiting. ? You have diarrhea. Get help right away if:  Your blood glucose is lower than 54 mg/dL (3 mmol/L).  You become confused, or you have trouble thinking clearly.  You have difficulty breathing.  You have moderate or large ketone levels in your urine. These symptoms may represent a serious problem that is an emergency. Do not wait to see if the symptoms will go away. Get medical help right away. Call your local emergency services (911 in the  [place] Do not drive yourself to the hospital. Summary  Monitoring your blood glucose is an important part of managing your diabetes.  Blood glucose monitoring involves checking your blood glucose as often as directed and keeping a log or record of your results over time.  Your health care provider will set individualized treatment goals for you. Your goals will be based on your age, other medical conditions you have, and how you respond to diabetes treatment.  Every time you check your blood glucose, write down your result. Also, write down any notes about things that may be affecting your blood glucose, such as your diet and exercise for the day. This information is not intended to replace advice given to you by your health care provider. Make sure you discuss any questions you have with your health care provider. Document Revised: 08/07/2020 Document Reviewed: 08/07/2020 Elsevier Patient Education  2021 Elsevier Inc.   Diabetes Mellitus Action Plan Following a diabetes action plan is a way for you to manage your diabetes (diabetes mellitus) symptoms. The plan is color-coded to help you understand what actions you need to take based on any symptoms you are having.  If you have symptoms in the red zone, you need medical care right away.  If you have symptoms in the yellow zone, you are having problems.  If you have symptoms in the green zone, you are doing well. Learning about and understanding diabetes can take time. Follow the plan that you develop with your health care provider. Know the target range for your blood sugar (glucose) level, and review your treatment plan with your health care provider at each visit. The target range for my blood sugar level is __________________________ mg/dL. Red zone Get medical help right away if you have any of the following symptoms:  A blood sugar test result that is below  54 mg/dL (3 mmol/L).  A blood sugar test result that is at or above 240  mg/dL (16.1 mmol/L) for 2 days in a row.  Confusion or trouble thinking clearly.  Difficulty breathing.  Sickness or a fever for 2 or more days that is not getting better.  Moderate or large ketone levels in your urine.  Feeling tired or having no energy. If you have any red zone symptoms, do not wait to see if the symptoms will go away. Get medical help right away. Call your local emergency services (911 in the [place] Do not drive yourself to the hospital. If you have severely low blood sugar (severe hypoglycemia) and you cannot eat or drink, you may need glucagon. Make sure a family member or close friend knows how to check your blood sugar and how to give you glucagon. You may need to be treated in a hospital for this condition.   Yellow zone If you have any of the following symptoms, your diabetes is not under control and you may need to make some changes:  A blood sugar test result that is at or above 240 mg/dL (09.6 mmol/L) for 2 days in a row.  Blood sugar test results that are below 70 mg/dL (3.9 mmol/L).  Other symptoms of hypoglycemia, such as: ? Shaking or feeling light-headed. ? Confusion or irritability. ? Feeling hungry. ? Having a fast heartbeat. If you have any yellow zone symptoms:  Treat your hypoglycemia by eating or drinking 15 grams of a rapid-acting carbohydrate. Follow the 15:15 rule: ? Take 15 grams of a rapid-acting carbohydrate, such as:  1 tube of glucose gel.  4 glucose pills.  4 oz (120 mL) of fruit juice.  4 oz (120 mL) of regular (not diet) soda. ? Check your blood sugar 15 minutes after you take the carbohydrate. ? If the repeat blood sugar test is still at or below 70 mg/dL (3.9 mmol/L), take 15 grams of a carbohydrate again. ? If your blood sugar does not increase above 70 mg/dL (3.9 mmol/L) after 3 tries, get medical help right away. ? After your blood sugar returns to normal, eat a meal or a snack within 1 hour.  Keep taking your daily  medicines as told by your health care provider.  Check your blood sugar more often than you normally would. ? Write down your results. ? Call your health care provider if you have trouble keeping your blood sugar in your target range.   Green zone These signs mean you are doing well and you can continue what you are doing to manage your diabetes:  Your blood sugar is within your personal target range. For most people, a blood sugar level before a meal (preprandial) should be 80-130 mg/dL (0.4-5.4 mmol/L).  You feel well, and you are able to do daily activities. If you are in the green zone, continue to manage your diabetes as told by your health care provider. To do this:  Eat a healthy diet.  Exercise regularly.  Check your blood sugar as told by your health care provider.  Take your medicines as told by your health care provider.   Where to find more information  American Diabetes Association (ADA): diabetes.org  Association of Diabetes Care & Education Specialists (ADCES): diabeteseducator.org Summary  Following a diabetes action plan is a way for you to manage your diabetes symptoms. The plan is color-coded to help you understand what actions you need to take based on any symptoms you are having.  Follow the plan that you develop with your health care provider. Make sure you know your personal target blood sugar level.  Review your treatment plan with your health care provider at each visit. This information is not intended to replace advice given to you by your health care provider. Make sure you discuss any questions you have with your health care provider. Document Revised: 05/16/2020 Document Reviewed: 05/16/2020 Elsevier Patient Education  2021 [employer].

## 2021-04-13 NOTE — Discharge Summary (Signed)
Patient ID: Kristin Clay MRN: [id number] DOB/AGE: [birth year] 35 y.o.  Admit date: 04/11/2021 Discharge date: 04/13/2021  Admission Diagnoses: IUP 24 weeks, Type 1 DM, uncontrolled  Discharge Diagnoses: SAA  Prenatal Procedures: ultrasound  Consults: Neonatology, Maternal Fetal Medicine  Hospital Course:  This is a 35 y.o. G2P0010 with IUP at 79w5dadmitted for above DX.See admit H & P for additional information Her insulin regiment was adjusted and she was seen by DM educator, Her CBG's normalized She had some problems with N/V and was started on antiemetics with a good response..Marland Kitchen She was observed, fetal heart rate monitoring remained reassuring, and she had no signs/symptoms of progressing preterm labor or other maternal-fetal concerns.    She was deemed stable for discharge to home with outpatient follow up.  Discharge Exam: Temp:  [97.9 F (36.6 C)-98.2 F (36.8 C)] 97.9 F (36.6 C) (05/22 0752) Pulse Rate:  [74-83] 83 (05/22 0752) Resp:  [17-18] 17 (05/22 0752) BP: (107-124)/(55-77) 110/70 (05/22 0752) SpO2:  [100 %] 100 % (05/22 0752)   Physical Examination: Lungs clear Heart RRR Abd soft, + BS, gravid Ext non tender  Fetal monitoring: FHR: 120-130's bpm, Variability: moderate, Accelerations: Present, Decelerations: Absent  Uterine activity: no contractions per hour  Significant Diagnostic Studies:  Results for orders placed or performed during the hospital encounter of 04/11/21 (from the past 168 hour(s))  Culture, OB Urine   Collection Time: 04/11/21  9:08 AM   Specimen: Urine, Random  Result Value Ref Range   Specimen Description URINE, RANDOM    Special Requests NONE    Culture (A)     MULTIPLE SPECIES PRESENT, SUGGEST RECOLLECTION NO GROUP B STREP (S.AGALACTIAE) ISOLATED Performed at [facility] [address] [place] [id number]   Report Status 04/12/2021 FINAL   Urinalysis, Routine w reflex microscopic Urine, Clean Catch    Collection Time: 04/11/21  9:08 AM  Result Value Ref Range   Color, Urine YELLOW YELLOW   APPearance CLOUDY (A) CLEAR   Specific Gravity, Urine 1.033 (H) 1.005 - 1.030   pH 5.0 5.0 - 8.0   Glucose, UA >=500 (A) NEGATIVE mg/dL   Hgb urine dipstick SMALL (A) NEGATIVE   Bilirubin Urine NEGATIVE NEGATIVE   Ketones, ur NEGATIVE NEGATIVE mg/dL   Protein, ur 30 (A) NEGATIVE mg/dL   Nitrite NEGATIVE NEGATIVE   Leukocytes,Ua MODERATE (A) NEGATIVE   RBC / HPF 21-50 0 - 5 RBC/hpf   WBC, UA >50 (H) 0 - 5 WBC/hpf   Bacteria, UA RARE (A) NONE SEEN   Squamous Epithelial / LPF 6-10 0 - 5   Mucus PRESENT    Budding Yeast PRESENT    Hyphae Yeast PRESENT   Glucose, capillary   Collection Time: 04/11/21  9:55 AM  Result Value Ref Range   Glucose-Capillary 207 (H) 70 - 99 mg/dL  Blood gas, arterial   Collection Time: 04/11/21 10:30 AM  Result Value Ref Range   FIO2 0.21    pH, Arterial 7.431 7.350 - 7.450   pCO2 arterial 29.1 (L) 32.0 - 48.0 mmHg   pO2, Arterial 103 83.0 - 108.0 mmHg   Bicarbonate 19.0 (L) 20.0 - 28.0 mmol/L   Acid-base deficit 3.7 (H) 0.0 - 2.0 mmol/L   O2 Saturation 100.0 %   Collection site RADIAL    Drawn by [id number]   Sample type ARTERIAL    Allens test (pass/fail) PASS PASS  CBC with Differential/Platelet   Collection Time: 04/11/21 10:49  AM  Result Value Ref Range   WBC 3.8 (L) 4.0 - 10.5 K/uL   RBC 4.82 3.87 - 5.11 MIL/uL   Hemoglobin 13.7 12.0 - 15.0 g/dL   HCT 41.4 36.0 - 46.0 %   MCV 85.9 80.0 - 100.0 fL   MCH 28.4 26.0 - 34.0 pg   MCHC 33.1 30.0 - 36.0 g/dL   RDW 13.6 11.5 - 15.5 %   Platelets 273 150 - 400 K/uL   nRBC 0.0 0.0 - 0.2 %   Neutrophils Relative % 70 %   Neutro Abs 2.7 1.7 - 7.7 K/uL   Lymphocytes Relative 22 %   Lymphs Abs 0.8 0.7 - 4.0 K/uL   Monocytes Relative 6 %   Monocytes Absolute 0.2 0.1 - 1.0 K/uL   Eosinophils Relative 1 %   Eosinophils Absolute 0.0 0.0 - 0.5 K/uL   Basophils Relative 0 %   Basophils Absolute 0.0 0.0 - 0.1 K/uL    Immature Granulocytes 1 %   Abs Immature Granulocytes 0.02 0.00 - 0.07 K/uL  Comprehensive metabolic panel   Collection Time: 04/11/21 10:49 AM  Result Value Ref Range   Sodium 135 135 - 145 mmol/L   Potassium 3.4 (L) 3.5 - 5.1 mmol/L   Chloride 107 98 - 111 mmol/L   CO2 20 (L) 22 - 32 mmol/L   Glucose, Bld 208 (H) 70 - 99 mg/dL   BUN 7 6 - 20 mg/dL   Creatinine, Ser 0.39 (L) 0.44 - 1.00 mg/dL   Calcium 8.7 (L) 8.9 - 10.3 mg/dL   Total Protein 6.7 6.5 - 8.1 g/dL   Albumin 2.9 (L) 3.5 - 5.0 g/dL   AST 11 (L) 15 - 41 U/L   ALT 9 0 - 44 U/L   Alkaline Phosphatase 65 38 - 126 U/L   Total Bilirubin 0.6 0.3 - 1.2 mg/dL   GFR, Estimated >60 >60 mL/min   Anion gap 8 5 - 15  Lipase, blood   Collection Time: 04/11/21 10:49 AM  Result Value Ref Range   Lipase 21 11 - 51 U/L  Hemoglobin A1c   Collection Time: 04/11/21 10:49 AM  Result Value Ref Range   Hgb A1c MFr Bld 9.8 (H) 4.8 - 5.6 %   Mean Plasma Glucose 234.56 mg/dL  Type and screen [place]   Collection Time: 04/11/21 10:49 AM  Result Value Ref Range   ABO/RH(D) A NEG    Antibody Screen NEG    Sample Expiration      04/14/2021,2359 Performed at [facility], [address] [place], [place] [address]   Resp Panel by RT-PCR (Flu A&B, Covid) Nasopharyngeal Swab   Collection Time: 04/11/21 12:36 PM   Specimen: Nasopharyngeal Swab; Nasopharyngeal(NP) swabs in vial transport medium  Result Value Ref Range   SARS Coronavirus 2 by RT PCR NEGATIVE NEGATIVE   Influenza A by PCR NEGATIVE NEGATIVE   Influenza B by PCR NEGATIVE NEGATIVE  Glucose, capillary   Collection Time: 04/11/21  1:31 PM  Result Value Ref Range   Glucose-Capillary 117 (H) 70 - 99 mg/dL  Glucose, capillary   Collection Time: 04/11/21  2:04 PM  Result Value Ref Range   Glucose-Capillary 113 (H) 70 - 99 mg/dL  Glucose, capillary   Collection Time: 04/11/21  3:12 PM  Result Value Ref Range   Glucose-Capillary 124 (H) 70 - 99 mg/dL   Glucose, capillary   Collection Time: 04/11/21  4:22 PM  Result Value Ref Range  Glucose-Capillary 122 (H) 70 - 99 mg/dL  Glucose, capillary   Collection Time: 04/11/21  5:26 PM  Result Value Ref Range   Glucose-Capillary 96 70 - 99 mg/dL  Glucose, capillary   Collection Time: 04/11/21  6:31 PM  Result Value Ref Range   Glucose-Capillary 115 (H) 70 - 99 mg/dL  Glucose, capillary   Collection Time: 04/11/21  7:37 PM  Result Value Ref Range   Glucose-Capillary 123 (H) 70 - 99 mg/dL  Glucose, capillary   Collection Time: 04/11/21 11:26 PM  Result Value Ref Range   Glucose-Capillary 78 70 - 99 mg/dL  Basic metabolic panel   Collection Time: 04/12/21  5:17 AM  Result Value Ref Range   Sodium 135 135 - 145 mmol/L   Potassium 3.2 (L) 3.5 - 5.1 mmol/L   Chloride 108 98 - 111 mmol/L   CO2 21 (L) 22 - 32 mmol/L   Glucose, Bld 107 (H) 70 - 99 mg/dL   BUN <5 (L) 6 - 20 mg/dL   Creatinine, Ser 0.37 (L) 0.44 - 1.00 mg/dL   Calcium 8.0 (L) 8.9 - 10.3 mg/dL   GFR, Estimated >60 >60 mL/min   Anion gap 6 5 - 15  Glucose, capillary   Collection Time: 04/12/21  6:05 AM  Result Value Ref Range   Glucose-Capillary 96 70 - 99 mg/dL  Glucose, capillary   Collection Time: 04/12/21  9:21 AM  Result Value Ref Range   Glucose-Capillary 107 (H) 70 - 99 mg/dL  Glucose, capillary   Collection Time: 04/12/21 11:30 AM  Result Value Ref Range   Glucose-Capillary 75 70 - 99 mg/dL  Glucose, capillary   Collection Time: 04/12/21 12:56 PM  Result Value Ref Range   Glucose-Capillary 66 (L) 70 - 99 mg/dL  Glucose, capillary   Collection Time: 04/12/21  3:01 PM  Result Value Ref Range   Glucose-Capillary 68 (L) 70 - 99 mg/dL  Glucose, capillary   Collection Time: 04/12/21  3:38 PM  Result Value Ref Range   Glucose-Capillary 101 (H) 70 - 99 mg/dL  Glucose, capillary   Collection Time: 04/12/21  9:32 PM  Result Value Ref Range   Glucose-Capillary 104 (H) 70 - 99 mg/dL  Glucose, capillary    Collection Time: 04/13/21  5:44 AM  Result Value Ref Range   Glucose-Capillary 153 (H) 70 - 99 mg/dL    Discharge Condition: Stable  Disposition: Discharge disposition: 01-Home or Self Care        Discharge Instructions    Discharge activity:  No Restrictions   Complete by: As directed    Discharge diet:   Complete by: As directed    ADA   Fetal Kick Count:  Lie on our left side for one hour after a meal, and count the number of times your baby kicks.  If it is less than 5 times, get up, move around and drink some juice.  Repeat the test 30 minutes later.  If it is still less than 5 kicks in an hour, notify your doctor.   Complete by: As directed    No sexual activity restrictions   Complete by: As directed    Notify physician for a general feeling that "something is not right"   Complete by: As directed    Notify physician for increase or change in vaginal discharge   Complete by: As directed    Notify physician for intestinal cramps, with or without diarrhea, sometimes described as "gas pain"   Complete by: As  directed    Notify physician for leaking of fluid   Complete by: As directed    Notify physician for low, dull backache, unrelieved by heat or Tylenol   Complete by: As directed    Notify physician for menstrual like cramps   Complete by: As directed    Notify physician for pelvic pressure   Complete by: As directed    Notify physician for uterine contractions.  These may be painless and feel like the uterus is tightening or the baby is  "balling up"   Complete by: As directed    Notify physician for vaginal bleeding   Complete by: As directed    PRETERM LABOR:  Includes any of the follwing symptoms that occur between 20 - [redacted] weeks gestation.  If these symptoms are not stopped, preterm labor can result in preterm delivery, placing your baby at risk   Complete by: As directed      Allergies as of 04/13/2021      Reactions   Pork-derived Products    Pt does not  want any pork products      Medication List    STOP taking these medications   acetaminophen 500 MG tablet Commonly known as: TYLENOL   insulin detemir 100 UNIT/ML injection Commonly known as: LEVEMIR   insulin regular 100 units/mL injection Commonly known as: NOVOLIN R   lisinopril 20 MG tablet Commonly known as: ZESTRIL   NovoLIN 70/30 (70-30) 100 UNIT/ML injection Generic drug: insulin NPH-regular Human     TAKE these medications   blood glucose meter kit and supplies Dispense based on patient and insurance preference. Use up to four times daily as directed. (FOR ICD-10 E10.9, E11.9).   folic acid 1 MG tablet Commonly known as: FOLVITE Take 1 mg by mouth daily.   glucose blood test strip Use as instructed QID   insulin NPH Human 100 UNIT/ML injection Commonly known as: NOVOLIN N Inject 0.06 mLs (6 Units total) into the skin 2 (two) times daily at 8 am and 10 pm. What changed:   how much to take  how to take this  when to take this  additional instructions   Insulin Syringe-Needle U-100 30G X 15/64" 0.5 ML Misc 1 Device by Does not apply route 2 (two) times daily.   metoCLOPramide 10 MG tablet Commonly known as: REGLAN Take 1 tablet (10 mg total) by mouth every 6 (six) hours.   ondansetron 4 MG disintegrating tablet Commonly known as: ZOFRAN-ODT Take 1 tablet (4 mg total) by mouth every 8 (eight) hours as needed for nausea or vomiting.   OneTouch Delica Lancets 48G Misc 1 Device by Does not apply route in the morning, at noon, in the evening, and at bedtime.   PRENATAL VITAMIN PO Take by mouth.   VITAMIN B 12 PO Take by mouth.       Follow-up Ashley for [facility]. Schedule an appointment as soon as possible for a visit in 1 week(s).   Specialty: Obstetrics and Gynecology Contact information: [place] [phone]               Signed: Chancy Milroy M.D. 04/13/2021, 10:01 AM

## 2021-05-01 ENCOUNTER — Ambulatory Visit (INDEPENDENT_AMBULATORY_CARE_PROVIDER_SITE_OTHER): Payer: Self-pay | Admitting: Obstetrics and Gynecology

## 2021-05-01 ENCOUNTER — Encounter: Payer: Self-pay | Admitting: Obstetrics and Gynecology

## 2021-05-01 ENCOUNTER — Other Ambulatory Visit: Payer: Self-pay

## 2021-05-01 ENCOUNTER — Other Ambulatory Visit (HOSPITAL_COMMUNITY)
Admission: RE | Admit: 2021-05-01 | Discharge: 2021-05-01 | Disposition: A | Discharge status: Home / Self Care | Payer: Medicaid Other | Source: Ambulatory Visit | Attending: Obstetrics and Gynecology | Admitting: Obstetrics and Gynecology

## 2021-05-01 ENCOUNTER — Encounter: Payer: Medicaid Other | Attending: Obstetrics and Gynecology | Admitting: Registered"

## 2021-05-01 ENCOUNTER — Ambulatory Visit: Payer: Self-pay | Admitting: Registered"

## 2021-05-01 VITALS — BP 113/71 | HR 82 | Wt 109.5 lb

## 2021-05-01 DIAGNOSIS — O24019 Pre-existing diabetes mellitus, type 1, in pregnancy, unspecified trimester: Secondary | ICD-10-CM | POA: Insufficient documentation

## 2021-05-01 DIAGNOSIS — Z8639 Personal history of other endocrine, nutritional and metabolic disease: Secondary | ICD-10-CM | POA: Insufficient documentation

## 2021-05-01 DIAGNOSIS — O0932 Supervision of pregnancy with insufficient antenatal care, second trimester: Secondary | ICD-10-CM | POA: Insufficient documentation

## 2021-05-01 DIAGNOSIS — O0990 Supervision of high risk pregnancy, unspecified, unspecified trimester: Secondary | ICD-10-CM | POA: Diagnosis not present

## 2021-05-01 DIAGNOSIS — O24013 Pre-existing diabetes mellitus, type 1, in pregnancy, third trimester: Secondary | ICD-10-CM | POA: Insufficient documentation

## 2021-05-01 DIAGNOSIS — Z6791 Unspecified blood type, Rh negative: Secondary | ICD-10-CM

## 2021-05-01 DIAGNOSIS — O24012 Pre-existing diabetes mellitus, type 1, in pregnancy, second trimester: Secondary | ICD-10-CM

## 2021-05-01 DIAGNOSIS — Z3A28 28 weeks gestation of pregnancy: Secondary | ICD-10-CM | POA: Insufficient documentation

## 2021-05-01 DIAGNOSIS — O26899 Other specified pregnancy related conditions, unspecified trimester: Secondary | ICD-10-CM | POA: Insufficient documentation

## 2021-05-01 DIAGNOSIS — Z3A27 27 weeks gestation of pregnancy: Secondary | ICD-10-CM

## 2021-05-01 DIAGNOSIS — Z789 Other specified health status: Secondary | ICD-10-CM

## 2021-05-01 DIAGNOSIS — N90810 Female genital mutilation status, unspecified: Secondary | ICD-10-CM

## 2021-05-01 DIAGNOSIS — O26893 Other specified pregnancy related conditions, third trimester: Secondary | ICD-10-CM

## 2021-05-01 HISTORY — DX: Other specified pregnancy related conditions, unspecified trimester: O26.899

## 2021-05-01 HISTORY — DX: Personal history of other endocrine, nutritional and metabolic disease: Z86.39

## 2021-05-01 HISTORY — DX: Unspecified blood type, rh negative: Z67.91

## 2021-05-01 MED ORDER — ULTRA FLO INSULIN PEN NEEDLES 32G X 4 MM MISC: 1.0000 | Freq: Three times a day (TID) | 4 refills | Status: DC

## 2021-05-01 MED ORDER — INSULIN ASPART 100 UNIT/ML FLEXPEN: 3.0000 [IU] | PEN_INJECTOR | Freq: Three times a day (TID) | SUBCUTANEOUS | 11 refills | Status: DC

## 2021-05-01 NOTE — Progress Notes (Signed)
Interpreter services provided by Tomie China from [facility]  Patient was scheduled to be seen on 05/01/2021 for Type 1 diabetes in pregnancy self management education. EDD 07/29/21; [redacted]w[redacted]d   Pt obstetric history is significant for miscarriage x 1, uncontrolled Type 1 DM and DKA.  Patient was not able to be seen for education today due to MD appointment needing additional time. Pt was not available to return later in the day. Patient states she is not currently testing blood sugar because she is out of supplies.    Meter provided to patient today:  [employer], Lot [id number] CBG: 133 mg/dL  Patient previously met with diabetes educator last pregnancy, March 2021, just before miscarriage. Pt does not remember meeting with educator.  The following learning objectives reviewed during follow-up visit:  States when to check blood glucose levels Demonstrates proper blood glucose monitoring techniques  Patient instructed to monitor glucose levels: FBS: 70 - 95 mg/dl 2 hour: <120 mg/dl  Patient received the following handouts: Blood Sugar Log  Patient will be seen for follow-up next available appointment.

## 2021-05-01 NOTE — Progress Notes (Signed)
INITIAL PRENATAL VISIT NOTE  Subjective:  Kristin Clay is a 35 y.o. G2P0010 at 23w2dby sure LMP being seen today for her initial prenatal visit.  She has an obstetric history significant for miscarriage x 1. She has a medical history significant for uncontrolled Type 1 DM and DKA.  Pt had recent hospital admission 5/20-5/22/2022 for diabetes management and insulin modification.  Diabetic counseling changed insulin to 6 umits NPH BID and 3 units novolog with meals.  Pt has not been taking the novolog.  Patient reports no complaints.  Contractions: Not present. Vag. Bleeding: None.  Movement: Present. Denies leaking of fluid.    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    clitorectomy as well   FOOT SURGERY  2018   FOOT SURGERY     HAND SURGERY  2019   HAND SURGERY      OB History  Gravida Para Term Preterm AB Living  2 0 0 0 1    SAB IAB Ectopic Multiple Live Births  1 0 0        # Outcome Date GA Lbr Len/2nd Weight Sex Delivery Anes PTL Lv  2 Current           1 SAB 12/2019            Social History   Socioeconomic History   Marital status: Married    Spouse name: Not on file   Number of children: Not on file   Years of education: Not on file   Highest education level: Not on file  Occupational History   Not on file  Tobacco Use   Smoking status: Never   Smokeless tobacco: Never  Vaping Use   Vaping Use: Never used  Substance and Sexual Activity   Alcohol use: Never   Drug use: Never   Sexual activity: Yes    Birth control/protection: None  Other Topics Concern   Not on file   Social History Narrative   ** Merged History Encounter **       ** Merged History Encounter **       Social Determinants of Health   Financial Resource Strain: Not on file  Food Insecurity: Not on file  Transportation Needs: Not on file  Physical Activity: Not on file  Stress: Not on file  Social Connections: Not on file    Family History  Problem Relation Age of Onset   Hyperlipidemia Mother    Hypertension Mother    Kidney disease Father    Alzheimer's disease Father      Current Outpatient Medications:    blood glucose meter kit and supplies, Dispense based on patient and insurance preference. Use up to four times daily as directed. (FOR ICD-10 E10.9, E11.9)., Disp: 1 each, Rfl: 0   Cyanocobalamin (VITAMIN B 12 PO), Take by mouth., Disp: , Rfl:    folic acid (FOLVITE) 1 MG tablet, Take 1 mg by mouth daily., Disp: , Rfl:    glucose blood test strip, Use as instructed QID, Disp: 100 each,  Rfl: 12   insulin aspart (NOVOLOG) 100 UNIT/ML FlexPen, Inject 3 Units into the skin 3 (three) times daily with meals., Disp: 15 mL, Rfl: 11   insulin NPH Human (NOVOLIN N) 100 UNIT/ML injection, Inject 0.06 mLs (6 Units total) into the skin 2 (two) times daily at 8 am and 10 pm., Disp: 10 mL, Rfl: 11   Insulin Pen Needle (ULTRA FLO INSULIN PEN NEEDLES) 32G X 4 MM MISC, 1 Device by Does not apply route 3 (three) times daily., Disp: 100 each, Rfl: 4   Insulin Syringe-Needle U-100 30G X 15/64" 0.5 ML MISC, 1 Device by Does not apply route 2 (two) times daily., Disp: 100 each, Rfl: 5   OneTouch Delica Lancets 35D MISC, 1 Device by Does not apply route in the morning, at noon, in the evening, and at bedtime., Disp: 100 each, Rfl: 2   Prenatal Vit-Fe Fumarate-FA (PRENATAL VITAMIN PO), Take by mouth., Disp: , Rfl:    metoCLOPramide (REGLAN) 10 MG tablet, Take 1 tablet (10 mg total) by mouth every 6 (six) hours. (Patient not taking: Reported on 05/01/2021), Disp: 20 tablet, Rfl: 0   ondansetron  (ZOFRAN-ODT) 4 MG disintegrating tablet, Take 1 tablet (4 mg total) by mouth every 8 (eight) hours as needed for nausea or vomiting. (Patient not taking: Reported on 05/01/2021), Disp: 8 tablet, Rfl: 0  Allergies  Allergen Reactions   Pork-Derived Products     Pt does not want any pork products    Review of Systems: Negative except for what is mentioned in HPI.  Objective:   Vitals:   05/01/21 1014  BP: 113/71  Pulse: 82  Weight: 109 lb 8 oz (49.7 kg)    Fetal Status: Fetal Heart Rate (bpm): 138   Movement: Present     Physical Exam: BP 113/71   Pulse 82   Wt 109 lb 8 oz (49.7 kg)   LMP 10/22/2020   BMI 19.40 kg/m  CONSTITUTIONAL: Well-developed, well-nourished female in no acute distress.  NEUROLOGIC: Alert and oriented to person, place, and time. Normal reflexes, muscle tone coordination. No cranial nerve deficit noted. PSYCHIATRIC: Normal mood and affect. Normal behavior. Normal judgment and thought content. SKIN: Skin is warm and dry. No rash noted. Not diaphoretic. No erythema. No pallor. HENT:  Normocephalic, atraumatic, External right and left ear normal. Oropharynx is clear and moist EYES: Conjunctivae and EOM are normal. Pupils are equal, round, and reactive to light. No scleral icterus.  NECK: Normal range of motion, supple, no masses CARDIOVASCULAR: Normal heart rate noted, regular rhythm RESPIRATORY: Effort and breath sounds normal, no problems with respiration noted BREASTS: symmetric, non-tender, no masses palpable ABDOMEN: Soft, nontender, nondistended, gravid. GU: abnormal external genitalia due to female circumcision,   nulliparous normal appearing cervix, scant white discharge in vagina, no lesions noted, extremely small and narrow introitis Bimanual: 24 weeks sized uterus, no adnexal tenderness or palpable lesions noted MUSCULOSKELETAL: Normal range of motion. EXT:  No edema and no tenderness. 2+ distal pulses.   Assessment and Plan:  Pregnancy: G2P0010  at [redacted]w[redacted]d by sureLMP  1. Supervision of high risk pregnancy, antepartum  - Comprehensive metabolic panel - Culture, OB Urine - Hemoglobin A1c - TSH - US Fetal Echocardiography; Future - ABO/Rh; Future - Antibody screen; Future - Hepatitis C antibody - Hepatitis B surface antigen - HIV Antibody (routine testing w rflx) - RPR - Rubella screen - Cytology - PAP( [place]) - Protein / creatinine ratio, urine - CBC - Cervicovaginal ancillary only( [facility]) - Antibody screen - ABO/Rh  2. Rh negative status during pregnancy in third trimester Rhogam at next visit in 1 week  3. [redacted] weeks gestation of pregnancy   4. 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, second trimester Growth scan in 2 weeks , also ? SGA, pt advised to document all blood sugars - insulin aspart (NOVOLOG) 100 UNIT/ML FlexPen; Inject 3 Units into the skin 3 (three) times daily with meals.  Dispense: 15 mL; Refill: 11 - Insulin Pen Needle (ULTRA FLO INSULIN PEN NEEDLES) 32G X 4 MM MISC; 1 Device by Does not apply route 3 (three) times daily.  Dispense: 100 each; Refill: 4 - Ambulatory referral to Endocrinology  7. History of diabetic ketoacidosis Monitor blood sugars closely, glucose sheet given  8. Late prenatal care affecting pregnancy in second trimester    Preterm labor symptoms and general obstetric precautions including but not limited to vaginal bleeding, contractions, leaking of fluid and fetal movement were reviewed in detail with the patient.  Please refer to After Visit Summary for other counseling recommendations.   Return in about 1 week (around 05/08/2021).  Griffin Basil 05/01/2021 1:19 PM

## 2021-05-01 NOTE — Patient Instructions (Signed)
AREA PEDIATRIC/FAMILY PRACTICE PHYSICIANS  [place] ([address]) [facility] Chambliss, MD; Eniola, MD; Hale, MD; Hensel, MD; McDiarmid, MD; McIntyer, MD; Brienna Bass, MD; Walden, MD [address] [place], [place] [address] [phone] Mon-Fri 8:30-12:30, 1:30-5:00 Providers come to see babies at [facility] Accepting Medicaid [facility] providers who accept newborns: Koirala, MD; Morrow, MD; Wolters, MD [address], [place], [place] [address] [phone] Mon-Fri 8:00-5:30 Babies seen by providers at [facility] Does NOT accept Medicaid Please call early in hospitalization for appointment (limited availability)  [facility] Mulberry, MD [address] [place], [place] [address] [phone] Mon, Tue, Thur, Fri 8:30-5:00, Wed 10:00-7:00 (closed 1-2pm) Babies seen by [facility] providers Accepting Medicaid Rubin - Pediatrician Rubin, MD [address], [place], [place] [address] [phone] Mon-Fri 8:30-5:00, Sat 8:30-12:00 Provider comes to see babies at [facility] Accepting Medicaid Must have been referred from current patients or contacted office prior to delivery [facility] ([facility]) Brown, MD; Chandler, MD; Ettefagh, MD; Grant, MD; Lester, MD; McCormick, MD; McQueen, MD; Prose, MD; Simha, MD; Stanley, MD; Stryffeler, NP; Tebben, NP [address], [place], [place] [address] [phone] Mon, Tue, Thur, Fri 8:30-5:30, Wed 9:30-5:30, Sat 8:30-12:30 Babies seen by [facility] providers Accepting Medicaid Only accepting infants of first-time parents or siblings of current patients Hospital discharge coordinator will make follow-up appointment Jack Amos [address], [place], [place]  [address] [phone]   Fax - [phone] [facility] [address], [place], [place]  [address] Phone - [phone]   Fax - [phone] Shilpa Gosrani [address], [place], [place]  [address] [phone]  [place] ([address]) [facility] Bates, MD; Brassfield, MD; Cooper, Cox, MD; MD; Davis, MD; Dovico, MD; Ettefaugh, MD; Little, MD; Lowe, MD; Keiffer, MD; Melvin, MD; Sumner, MD; Williams, MD [address], [place], [place] [address] [phone] Mon-Fri 8:30-5:00 (extended evenings Mon-Thur as needed), Sat-Sun 10:00-1:00 Providers come to see babies at [facility] Accepting Medicaid for families of first-time babies and families with all children in the household age 3 and under. Must register with office prior to making appointment (M-F only). [facility] Henson, NP; Knapp, MD; Lalonde, MD; Tysinger, PA [address] [place], [place] [address] [phone] Mon-Fri 8:00-5:00 Babies seen by providers at [facility] Does NOT accept Medicaid/Commercial Insurance Only [facility] ([facility])  Artis, MD; Barnes, MD; Bratton, MD; Coccaro, MD; Lockett Gardner, MD; Kramer, MD; Marshall, MD; Netherton, MD; Poleto, MD; Skinner, MD [address] [place], [place] [address] [phone] Mon-Fri 8:30-5:30, Sat (Oct.-Mar.) 9:00-1:00 Babies seen by providers at [facility] Accepting Medicaid  [place] ([address]) [facility] Reid, MD; Warner, MD [address], [place], [place] [address] [phone] Mon-Fri 8:30-5:00, Sat 8:30-12:00 Providers come to see babies at [facility] Does NOT accept Medicaid [facility] Becker, PA; Hagler, MD; Scifres, PA; Sun, MD; Swayne, MD [address], [place],  [address] [phone] Mon-Fri 8:00-5:00 Babies seen by providers at [facility] Does NOT accept Medicaid Only accepting babies of parents who  are patients Please call early in hospitalization for appointment (limited availability)  Pediatricians Clark, MD; Frye, MD; Kelleher, MD; Mack, NP; Miller, MD; O'Keller, MD; Patterson, NP; Pudlo, MD; Puzio, MD; Thomas, MD; Tucker, MD; Twiselton, MD [address], [place], [place] [address] [phone] Mon-Fri 8:00-5:00, Sat 9:00-12:00 Providers come to see babies at [facility] Does NOT accept Medicaid  [place] ([address]) [facility] providers accepting new patients: Brake, NP; Wharton, PA [address], [place], [place] [address] [phone] Mon-Fri 8:00-5:00 Babies seen by providers at [facility] Does NOT accept Medicaid Only accepting babies of parents who are patients Please call early in hospitalization for appointment (limited availability) [facility] Gay, MD; Quinlan, MD [address] [place], [place] [address] [phone] (press 1 to schedule appointment) Mon-Fri 8:00-5:00 Providers come to see babies at [facility] Does NOT accept Medicaid [facility] Mazer, MD [address] [place], [place] [address] [phone] Mon-Fri 8:30-5:00 (lunch 12:30-1:00), extended hours by appointment only Wed 5:00-6:30 Babies seen by [facility] providers Accepting Medicaid [facility] Banks, MD; Jordan, MD; Koberlein, MD [address], [place], [place] [address] [phone] Mon-Fri 8:00-5:00 Babies seen by [facility] providers Does NOT accept Medicaid [facility] Parker, MD; Hunter, MD; Wallace, DO [address] [place], [place] [address] [phone] Mon-Fri 8:00-5:00 Babies seen by [facility] providers Does NOT accept Medicaid [facility] Brandon, PA; Brecken, PA; Christy, NP; Dees, MD; DeClaire, MD; DeWeese, MD; Hansen, NP; Mills, NP; Parrish, NP; Smoot, NP; Summer, MD; Vapne,  MD [address] [place], [place] [address] [phone] Mon-Fri 8:30-5:00, Sat 10:00-1:00 Providers come to see babies at [facility] Does NOT accept Medicaid Free prenatal information session Tuesdays at 4:45pm [facility] Bouska, MD; Gordon, PA; Jeffery, PA; Weber, PA [address] [place] [address] [phone] Mon-Fri 7:30-5:30 Babies seen by [facility] providers [facility] [address], [place], [place]  [address] [phone]   Fax - [phone]  [place] ([address] & [address]) [facility] [place] [address] [place], [place] [address] [phone] Mon-Thur 8:00-6:00 Providers come to see babies at [facility] Accepting Medicaid [facility] Anderson, NP; Badger, MD; Beal, PA; Spencer, PA [address] [place], [place] [address] [phone] Mon-Thur 7:30-7:30, Fri 7:30-4:30 Babies seen by [facility] providers Accepting Medicaid [facility] Agbuya, MD; Klett, NP; Romgoolam, MD [address], [place], [place] [address] [phone] Mon-Fri 8:30-5:00, Sat 8:30-12:00 Providers come to see babies at [facility] Accepting Medicaid Must have "Meet & Greet" appointment at office prior to delivery [facility] ([facility]) McCord, MD; Wallace, MD; Wood, MD [address], [place], [place] [address] [phone] Mon-Wed 8:00-6:00, Thur-Fri 8:00-5:00, Sat 9:00-12:00 Providers come to see babies at [facility] Does NOT accept Medicaid Only accepting siblings of current patients [facility]  [address], [place], [place]  [address] [phone]   Fax - [phone] [facility] [address], [place], [place]  [address] [phone]   Fax -  [phone]  [place] ([address] & [address]) [facility] Cirigliano, DO; Matthews, DO [address] , [place] [address] [phone] Mon-Fri 7:00-5:00 Babies seen by [facility] providers Does NOT accept Medicaid [facility] Briscoe, MD; Howley, PA; Moreira, PA [address], [place], [place] [address] [phone] Mon-Fri 8:00-5:00 Babies seen by [facility] providers Accepting Medicaid [facility] Boyd, MD; Church, PA; Jones, NP; Osborn, PA [address], , [place] [address] [phone] Mon-Fri 8:00-5:00 Babies seen by providers at [facility] Accepting Medicaid  [place] ([address]) [facility] [place] Wendling, DO [address] [place], [place] [address] [phone] Mon-Fri 8:00-5:00 Babies seen by [facility] providers Does NOT accept Medicaid Limited availability, please call early in hospitalization to schedule follow-up [facility] Calderon, PA; Cummings, MD; Dillard, MD; Martin, PA; Olson, MD; VanDeven, PA [address], [place], [place] [address] [phone] Mon-Fri 8:30-5:00, Sat 9:00-12:00 Babies seen by providers at [facility] Accepting Medicaid Please register online then schedule online or call office [email or web address]) Hunter, NP; Kumar, MD; Martin Rogers, PA [address], [place], [place] [address] [phone] Mon-Fri 8:00-5:00 Babies seen by providers at [facility] Accepting Medicaid [facility] ([facility]) [place], MD; Kristi Fleenor, NP; West, MD [address], [place], [place] [address] [phone] Mon-Fri 8:00-5:30, Sat&Sun by appointment (phones open at  8:30) Babies seen by [facility] providers Accepting Medicaid Must be a first-time baby or sibling of current patient [facility] [place]  [address], [place], [place]  [address] [phone]   Fax - [phone]  [place] ([address]) [facility] Brown, PA; Cowen, PA; Rice, MD; Helton, PA; Spry, MD [address] [place], [place] [address] [phone] Mon-Thur 8:00-7:00, Fri 8:00-5:00, Sat 8:00-12:00, Sun 9:00-12:00 Babies seen by [facility] providers Accepting Medicaid [facility] Coe-Goins, MD; Marshall, MD; Pierre-Louis, MD [address], [place], [place] [address] [phone] Mon-Thur 8:00-5:00 Babies seen by providers at [facility] Accepting Medicaid [facility] Bratton, MD; Coe-Goins, MD; Hayes, MD; Lewis, MD; List, MD; Lott, MD; Marshall, MD; Moran, MD; O'Saliyah Gillin, MD; Pierre-Louis, MD; Pitonzo, MD; Scholer, MD; Spangle, MD [address] [place], [place] [address] [phone] Mon-Fri 8:00-5:30, Sat (Oct.-Mar.) 9:00-1:00 Babies seen by providers at [facility] Accepting Medicaid Must fill out new patient packet, available online at [email or web address] [facility] ([facility]) Friddle, NP; Harris, NP; Kelly, NP; Logan, MD; Melvin, PA; Poth, MD; Ramadoss, MD; Stanton, NP [address], [place], [place] [address] [phone] Mon-Thur 8:00-5:30, Fri 8:00-5:00 Babies seen by providers at [facility] Accepting Medicaid  [place] ([address]) [facility] Dixon, PA; [place], MD; Pickard, MD; Tapia, PA [address], [place], [place] [address] [phone] Mon-Fri 8:00-5:00 Babies seen by providers at [facility] Accepting Medicaid   [place] ([address]) [facility] [place] Masneri, DO; Meyers, MD; Nelson, PA [address], [place], [place] [address] [phone] Mon-Fri 8:00-5:00 Babies seen by providers at [facility] Does NOT accept Medicaid Limited appointment availability, please call early in hospitalization   [facility] [place] Kunedd, DO; McGowen, MD [address], [place], [place] [address] [phone] Mon-Fri 8:00-5:00 Babies seen by [facility] providers Does NOT accept Medicaid [facility] [place] Cameron, MD; MacDonald, MD; Michaels, PA; Nayak, MD [address] [place] [address], [place], [place] [address] [phone] Mon-Fri 8:00-5:00 After hours clinic ([address] [place], [place] [address]) [phone] Mon-Fri 5:00-8:00, Sat 12:00-6:00, Sun 10:00-4:00 Babies seen by [facility] providers Accepting Medicaid [facility] [place] [address], [place], [place]  [address] [phone]   Fax - [phone]  [place] ([address])  [facility] [place] [facility] Andy, MD [address], [place], [place] [address] [phone] Mon-Fri 8:00-5:00 Babies seen by [facility] providers Does NOT accept Medicaid [facility] [place] ([facility] [place]) Eksir, MD [address], [place], [place] [address] [phone] Mon-Thur 8:00-7:00, Fri 8:00-5:00, Sat 8:00-12:00 Babies seen by providers at [facility] Accepting Medicaid - but does not have vaccinations in office (must be received elsewhere) Limited availability, please call early in hospitalization  [place] ([address]) [facility]  Charlene Flemming, MD [address], [place] [address] [phone]  Fax [phone]  

## 2021-05-02 LAB — CERVICOVAGINAL ANCILLARY ONLY
Bacterial Vaginitis (gardnerella): POSITIVE — AB
Candida Glabrata: NEGATIVE
Candida Vaginitis: POSITIVE — AB
Chlamydia: NEGATIVE
Comment: NEGATIVE
Comment: NEGATIVE
Comment: NEGATIVE
Comment: NEGATIVE
Comment: NEGATIVE
Comment: NORMAL
Neisseria Gonorrhea: NEGATIVE
Trichomonas: NEGATIVE

## 2021-05-02 LAB — PROTEIN / CREATININE RATIO, URINE
Creatinine, Urine: 126.7 mg/dL
Protein, Ur: 30.9 mg/dL
Protein/Creat Ratio: 244 mg/g creat — ABNORMAL HIGH (ref 0–200)

## 2021-05-02 LAB — CBC
Hematocrit: 37.8 % (ref 34.0–46.6)
Hemoglobin: 12.2 g/dL (ref 11.1–15.9)
MCH: 28.5 pg (ref 26.6–33.0)
MCHC: 32.3 g/dL (ref 31.5–35.7)
MCV: 88 fL (ref 79–97)
Platelets: 236 10*3/uL (ref 150–450)
RBC: 4.28 x10E6/uL (ref 3.77–5.28)
RDW: 14 % (ref 11.7–15.4)
WBC: 3.7 10*3/uL (ref 3.4–10.8)

## 2021-05-02 LAB — HEPATITIS B SURFACE ANTIGEN: Hepatitis B Surface Ag: NEGATIVE

## 2021-05-02 LAB — COMPREHENSIVE METABOLIC PANEL
ALT: 6 IU/L (ref 0–32)
AST: 7 IU/L (ref 0–40)
Albumin/Globulin Ratio: 1.1 — ABNORMAL LOW (ref 1.2–2.2)
Albumin: 3.3 g/dL — ABNORMAL LOW (ref 3.8–4.8)
Alkaline Phosphatase: 69 IU/L (ref 44–121)
BUN/Creatinine Ratio: 15 (ref 9–23)
BUN: 6 mg/dL (ref 6–20)
Bilirubin Total: 0.3 mg/dL (ref 0.0–1.2)
CO2: 17 mmol/L — ABNORMAL LOW (ref 20–29)
Calcium: 8.4 mg/dL — ABNORMAL LOW (ref 8.7–10.2)
Chloride: 105 mmol/L (ref 96–106)
Creatinine, Ser: 0.39 mg/dL — ABNORMAL LOW (ref 0.57–1.00)
Globulin, Total: 2.9 g/dL (ref 1.5–4.5)
Glucose: 94 mg/dL (ref 65–99)
Potassium: 3.5 mmol/L (ref 3.5–5.2)
Sodium: 137 mmol/L (ref 134–144)
Total Protein: 6.2 g/dL (ref 6.0–8.5)
eGFR: 133 mL/min/{1.73_m2} (ref >59)

## 2021-05-02 LAB — ANTIBODY SCREEN: Antibody Screen: NEGATIVE

## 2021-05-02 LAB — HEMOGLOBIN A1C
Est. average glucose Bld gHb Est-mCnc: 194 mg/dL
Hgb A1c MFr Bld: 8.4 % — ABNORMAL HIGH (ref 4.8–5.6)

## 2021-05-02 LAB — RPR: RPR Ser Ql: NONREACTIVE

## 2021-05-02 LAB — CYTOLOGY - PAP
Chlamydia: NEGATIVE
Comment: NEGATIVE
Comment: NEGATIVE
Comment: NORMAL
Diagnosis: NEGATIVE
High risk HPV: NEGATIVE
Neisseria Gonorrhea: NEGATIVE

## 2021-05-02 LAB — ABO/RH: Rh Factor: NEGATIVE

## 2021-05-02 LAB — RUBELLA SCREEN: Rubella Antibodies, IGG: 5.64 index (ref >0.99)

## 2021-05-02 LAB — TSH: TSH: 2.03 u[IU]/mL (ref 0.450–4.500)

## 2021-05-02 LAB — HEPATITIS C ANTIBODY: Hep C Virus Ab: <0.1 s/co ratio (ref 0.0–0.9)

## 2021-05-02 LAB — HIV ANTIBODY (ROUTINE TESTING W REFLEX): HIV Screen 4th Generation wRfx: NONREACTIVE

## 2021-05-03 LAB — CULTURE, OB URINE

## 2021-05-03 LAB — URINE CULTURE, OB REFLEX

## 2021-05-05 ENCOUNTER — Telehealth (INDEPENDENT_AMBULATORY_CARE_PROVIDER_SITE_OTHER): Payer: Self-pay | Admitting: Pharmacist

## 2021-05-05 DIAGNOSIS — Z9114 Patient's other noncompliance with medication regimen: Secondary | ICD-10-CM

## 2021-05-05 DIAGNOSIS — O24012 Pre-existing diabetes mellitus, type 1, in pregnancy, second trimester: Secondary | ICD-10-CM

## 2021-05-05 DIAGNOSIS — O219 Vomiting of pregnancy, unspecified: Secondary | ICD-10-CM

## 2021-05-05 MED ORDER — METOCLOPRAMIDE HCL 10 MG PO TABS: 10.0000 mg | ORAL_TABLET | Freq: Four times a day (QID) | ORAL | 0 refills | Status: DC

## 2021-05-05 MED ORDER — PRENATAL VITAMIN 27-0.8 MG PO TABS: 1.0000 | ORAL_TABLET | Freq: Every day | ORAL | 3 refills | Status: DC

## 2021-05-05 NOTE — Progress Notes (Signed)
Medication Management Visit Note   Kristin Clay 35 y.o. female presents for a telephone visit to  evaluate of compliance to insulin regimen visit today. She is currently pregnant at 26w6dwith TP5KDcomplicated by pregnancy. Interpreter #[phone] via [employer]landfor Arabic translation.  Current Outpatient Medications:    blood glucose meter kit and supplies, Dispense based on patient and insurance preference. Use up to four times daily as directed. (FOR ICD-10 E10.9, E11.9)., Disp: 1 each, Rfl: 0   Cyanocobalamin (VITAMIN B 12 PO), Take by mouth., Disp: , Rfl:    folic acid (FOLVITE) 1 MG tablet, Take 1 mg by mouth daily., Disp: , Rfl:    glucose blood test strip, Use as instructed QID, Disp: 100 each, Rfl: 12   insulin aspart (NOVOLOG) 100 UNIT/ML FlexPen, Inject 3 Units into the skin 3 (three) times daily with meals., Disp: 15 mL, Rfl: 11   insulin NPH Human (NOVOLIN N) 100 UNIT/ML injection, Inject 0.06 mLs (6 Units total) into the skin 2 (two) times daily at 8 am and 10 pm., Disp: 10 mL, Rfl: 11   Insulin Pen Needle (ULTRA FLO INSULIN PEN NEEDLES) 32G X 4 MM MISC, 1 Device by Does not apply route 3 (three) times daily., Disp: 100 each, Rfl: 4   Insulin Syringe-Needle U-100 30G X 15/64" 0.5 ML MISC, 1 Device by Does not apply route 2 (two) times daily., Disp: 100 each, Rfl: 5   metoCLOPramide (REGLAN) 10 MG tablet, Take 1 tablet (10 mg total) by mouth every 6 (six) hours. (Patient not taking: Reported on 05/01/2021), Disp: 20 tablet, Rfl: 0   ondansetron (ZOFRAN-ODT) 4 MG disintegrating tablet, Take 1 tablet (4 mg total) by mouth every 8 (eight) hours as needed for nausea or vomiting. (Patient not taking: Reported on 05/01/2021), Disp: 8 tablet, Rfl: 0   OneTouch Delica Lancets 345YMISC, 1 Device by Does not apply route in the morning, at noon, in the evening, and at bedtime., Disp: 100 each, Rfl: 2   Prenatal Vit-Fe Fumarate-FA (PRENATAL VITAMIN PO), Take by mouth., Disp:  , Rfl:    Assessment/Plan Patient states she is taking 6 units of insulin BID as well as 3 units TIDWM. When prompted further she states that she has not picked up the Novolog Flexpen and has been using the NPH vials for both basal and mealtime insulin doses. She denies s/sx of hypoglycemia. She does endorse some nausea/vomiting up to 15 times daily.   Counseled patient extensively on the differences between basal and bolus insulin and how they are not interchangeable. Counseled patient on appropriate dosing and administration. Patient agreeable to plan.   Will also send in prescription refills for nausea and vomiting (metoclopramide) as well as prenatal vitamins as she is out of refills.  Would evaluate compliance with current insulin regimen in the upcoming weeks and if continued confusion with regimen, have patient switch to Novolin/Humulin 70/30 at 9 units BIDWM. This will allow for ease of administration and better understanding as only one insulin vial or pen to use daily. This regimen will provide 6.3 units of basal and 2.7 units of bolus with every injections. Patient has DM education on 6/21 with ALevada Dywho can assess compliance at that time. Please provide additional referral to pharmacy if continued noncompliance.  VLolita Rieger PharmD

## 2021-05-06 ENCOUNTER — Other Ambulatory Visit: Payer: Self-pay

## 2021-05-06 ENCOUNTER — Encounter: Payer: Medicaid Other | Admitting: Registered"

## 2021-05-06 ENCOUNTER — Other Ambulatory Visit: Payer: Self-pay | Admitting: *Deleted

## 2021-05-06 ENCOUNTER — Ambulatory Visit (INDEPENDENT_AMBULATORY_CARE_PROVIDER_SITE_OTHER): Payer: Self-pay

## 2021-05-06 ENCOUNTER — Encounter: Payer: Self-pay | Admitting: Registered"

## 2021-05-06 ENCOUNTER — Telehealth: Payer: Self-pay

## 2021-05-06 VITALS — BP 129/92 | HR 99 | Wt 103.8 lb

## 2021-05-06 DIAGNOSIS — O24012 Pre-existing diabetes mellitus, type 1, in pregnancy, second trimester: Secondary | ICD-10-CM

## 2021-05-06 DIAGNOSIS — O24013 Pre-existing diabetes mellitus, type 1, in pregnancy, third trimester: Secondary | ICD-10-CM

## 2021-05-06 DIAGNOSIS — R030 Elevated blood-pressure reading, without diagnosis of hypertension: Secondary | ICD-10-CM

## 2021-05-06 DIAGNOSIS — O0990 Supervision of high risk pregnancy, unspecified, unspecified trimester: Secondary | ICD-10-CM

## 2021-05-06 DIAGNOSIS — B3731 Acute candidiasis of vulva and vagina: Secondary | ICD-10-CM

## 2021-05-06 DIAGNOSIS — O24019 Pre-existing diabetes mellitus, type 1, in pregnancy, unspecified trimester: Secondary | ICD-10-CM

## 2021-05-06 DIAGNOSIS — N760 Acute vaginitis: Secondary | ICD-10-CM

## 2021-05-06 DIAGNOSIS — Z3A28 28 weeks gestation of pregnancy: Secondary | ICD-10-CM

## 2021-05-06 DIAGNOSIS — Z6791 Unspecified blood type, Rh negative: Secondary | ICD-10-CM

## 2021-05-06 DIAGNOSIS — O26893 Other specified pregnancy related conditions, third trimester: Secondary | ICD-10-CM

## 2021-05-06 LAB — POCT URINALYSIS DIP (DEVICE)
Bilirubin Urine: NEGATIVE
Glucose, UA: >=1000 mg/dL — AB
Ketones, ur: 15 mg/dL — AB
Nitrite: NEGATIVE
Protein, ur: 100 mg/dL — AB
Specific Gravity, Urine: >=1.03 (ref 1.005–1.030)
Urobilinogen, UA: 1 mg/dL (ref 0.0–1.0)
pH: 6 (ref 5.0–8.0)

## 2021-05-06 LAB — GLUCOSE, CAPILLARY: Glucose-Capillary: 164 mg/dL — ABNORMAL HIGH (ref 70–99)

## 2021-05-06 MED ORDER — INSULIN NPH (HUMAN) (ISOPHANE) 100 UNIT/ML ~~LOC~~ SUSP
7.0000 [IU] | Freq: Two times a day (BID) | SUBCUTANEOUS | 11 refills | Status: DC
Start: 2021-05-06 — End: 2021-05-13

## 2021-05-06 MED ORDER — TERCONAZOLE 0.4 % VA CREA: 1.0000 | TOPICAL_CREAM | Freq: Every day | VAGINAL | 0 refills | Status: DC

## 2021-05-06 MED ORDER — INSULIN ASPART 100 UNIT/ML FLEXPEN: 9.0000 [IU] | PEN_INJECTOR | Freq: Three times a day (TID) | SUBCUTANEOUS | 11 refills | Status: DC

## 2021-05-06 MED ORDER — RHO D IMMUNE GLOBULIN 1500 UNIT/2ML IJ SOSY: 300.0000 ug | PREFILLED_SYRINGE | Freq: Once | INTRAMUSCULAR | Status: DC

## 2021-05-06 MED ORDER — METRONIDAZOLE 500 MG PO TABS
500.0000 mg | ORAL_TABLET | Freq: Two times a day (BID) | ORAL | 0 refills | Status: DC
Start: 2021-05-06 — End: 2021-05-22

## 2021-05-06 NOTE — Telephone Encounter (Signed)
Called Pt using Arabic [employer]  Interpreter Robb Matar id# [phone], to go over test results showing that she tested positive for yeast & BV. Advised that rx Flagyl & Terconazole was sent to pharmacy on file. Pt verbalized understanding.

## 2021-05-06 NOTE — Progress Notes (Signed)
PRENATAL VISIT NOTE  Subjective:  Kristin Clay is a 35 y.o. G2P0010 at 60w0dbeing seen today for ongoing prenatal care.  She is currently monitored for the following issues for this high-risk pregnancy and has DKA (diabetic ketoacidoses); Nausea & vomiting; Acute lower UTI; Syncope; Female circumcision; Language barrier; Type 1 diabetes mellitus with ketoacidosis, uncontrolled ([place]; DKA, type 1 ([place]; Acute pyelonephritis; Supervision of high risk pregnancy, antepartum; Rh negative status during pregnancy; [redacted] weeks gestation of pregnancy; 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, second trimester; History of diabetic ketoacidosis; and Late prenatal care affecting pregnancy in second trimester on their problem list.  Patient reports nausea and vomiting, however is not taking her medication as she says she was told it was not available at the pharmacy. Tolerates water, but unable to keep down food per patient. Reports she has vomited 3 times this morning. Contractions: Not present. Vag. Bleeding: None.  Movement: Present. Denies leaking of fluid.   The following portions of the patient's history were reviewed and updated as appropriate: allergies, current medications, past family history, past medical history, past social history, past surgical history and problem list.   Objective:   Vitals:   05/06/21 1022  BP: (!) 129/92  Pulse: 99  Weight: 103 lb 12.8 oz (47.1 kg)    Fetal Status: Fetal Heart Rate (bpm): 150 Fundal Height: 25 cm Movement: Present     General:  Alert, oriented and cooperative. Patient is in no acute distress.  Skin: Skin is warm and dry. No rash noted.   Cardiovascular: Normal heart rate noted  Respiratory: Normal respiratory effort, no problems with respiration noted  Abdomen: Soft, gravid, appropriate for gestational age.  Pain/Pressure: Absent     Pelvic: Cervical exam deferred        Extremities: Normal range of motion.  Edema: None   Mental Status: Normal mood and affect. Normal behavior. Normal judgment and thought content.   Assessment and Plan:  Pregnancy: G2P0010 at 279w0d1. [redacted] weeks gestation of pregnancy   2. Supervision of high risk pregnancy, antepartum - N/V; able to tolerate water. Has water  bottle at the bedside that she has sipped on throughout entire visit with no vomiting. Has Reglan prescribed, refilled yesterday, but not taking as she has not picked it up - Patient is not taking medications as prescribed. Taking PNV and NPH as she reports they were the only ones available at the pharmacy for pick up. Correct pharmacy was verified and [facility] confirmed medications were available for pick up. - I have concern for possible noncompliance vs lack/availability of resources, although she denies. Patient is able to verbalize and has understanding on medications, use, importance of, etc., so I doubt language barrier/interpretation is the issue.   - Navigator available and able to speak with patient today and resources were provided to patient who agrees to have a case manager work with her - Patient states she will pick up prescriptions.   3. Elevated BP without diagnosis of hypertension - No history of elevated BP. Denies HA, vision changes, RUQ/CP pain; labs drawn. - Strict precautions given   - CBC - Comp Met (CMET) - Protein / creatinine ratio, urine  4. 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, third trimester - Did not bring log. Per patient, she has not checked BS this morning. Last checked it last night and it was 118. She reports a 2hr PP CBG range of 115-170s, but unable to  give a fasting range.  - CBG in office today 164 - Is not taking Novolog, only using NPH. Reports that it was not available at pharmacy. - Diabetes educator at bedside to speak with patient to ensure patient is taking insulin and using meter correctly. Patient has appt next week with diabetes  education as well   - Consulted with Dr. Dione Plover who recommends insulin regimen be changed to 7 units NPH BID and 9 units Novolog TID. We discussed at length how to properly take insulin - Discussed at length signs/symptoms requiring prompt evaluation and patient verbalizes understanding  - Has growth [place] scheduled on 6/20 - Will have patient return in 1 week with MD  5. Rh negative - No bleeding thus far. Precautions given.  - Rhogam ordered, however patient left prior to Rhogam administration. Needs at next appt!  6. Language barrier - Interpretor used   Preterm labor symptoms and general obstetric precautions including but not limited to vaginal bleeding, contractions, leaking of fluid and fetal movement were reviewed in detail with the patient. Please refer to After Visit Summary for other counseling recommendations.   Return in about 1 week (around 05/13/2021).  Future Appointments  Date Time Provider [place]  05/12/2021  7:15 AM [facility] NURSE [facility]  05/12/2021  7:30 AM [facility] US2 [facility]  05/13/2021  9:55 AM Woodroe Mode, MD [facility]  05/13/2021 10:15 AM [facility] [place], CNM 05/06/21 6:20 PM

## 2021-05-06 NOTE — Progress Notes (Signed)
Patient was scheduled for education on 05/01/2021 after initial OB appointment. Patient did not have time for full education and RD only had time to provide instruction for using Prodigy meter.   Patient rescheduled appointment for education for 05/13/21. While patient was here today for MD appointment 6/14, RD stepped into the room to check on understanding of insulin Rx and stated she is concerned that she is losing weight due to N/V. Pt states she had already thrown up 3x today. POC BG was 167 mg/dL Pt states she had injected Novolin today and ate cereal for breakfast.   Patient states she will go back to the pharmacy to pick up prescriptions for nausea, prenatal vitamins, and both types of insulin.

## 2021-05-07 ENCOUNTER — Telehealth: Payer: Self-pay | Admitting: Family Medicine

## 2021-05-07 ENCOUNTER — Telehealth: Payer: Self-pay

## 2021-05-07 LAB — CBC
Hematocrit: 47.3 % — ABNORMAL HIGH (ref 34.0–46.6)
Hemoglobin: 14.9 g/dL (ref 11.1–15.9)
MCH: 27.7 pg (ref 26.6–33.0)
MCHC: 31.5 g/dL (ref 31.5–35.7)
MCV: 88 fL (ref 79–97)
Platelets: 285 10*3/uL (ref 150–450)
RBC: 5.37 x10E6/uL — ABNORMAL HIGH (ref 3.77–5.28)
RDW: 13.8 % (ref 11.7–15.4)
WBC: 3.7 10*3/uL (ref 3.4–10.8)

## 2021-05-07 LAB — COMPREHENSIVE METABOLIC PANEL
ALT: 5 IU/L (ref 0–32)
AST: 7 IU/L (ref 0–40)
Albumin/Globulin Ratio: 1.1 — ABNORMAL LOW (ref 1.2–2.2)
Albumin: 4.1 g/dL (ref 3.8–4.8)
Alkaline Phosphatase: 95 IU/L (ref 44–121)
BUN/Creatinine Ratio: 16 (ref 9–23)
BUN: 9 mg/dL (ref 6–20)
Bilirubin Total: 0.6 mg/dL (ref 0.0–1.2)
CO2: 20 mmol/L (ref 20–29)
Calcium: 10.1 mg/dL (ref 8.7–10.2)
Chloride: 101 mmol/L (ref 96–106)
Creatinine, Ser: 0.56 mg/dL — ABNORMAL LOW (ref 0.57–1.00)
Globulin, Total: 3.7 g/dL (ref 1.5–4.5)
Glucose: 170 mg/dL — ABNORMAL HIGH (ref 65–99)
Potassium: 3.4 mmol/L — ABNORMAL LOW (ref 3.5–5.2)
Sodium: 141 mmol/L (ref 134–144)
Total Protein: 7.8 g/dL (ref 6.0–8.5)
eGFR: 122 mL/min/{1.73_m2} (ref >59)

## 2021-05-07 LAB — PROTEIN / CREATININE RATIO, URINE
Creatinine, Urine: 146.7 mg/dL
Protein, Ur: 100.3 mg/dL
Protein/Creat Ratio: 684 mg/g creat — ABNORMAL HIGH (ref 0–200)

## 2021-05-07 NOTE — Telephone Encounter (Signed)
Returned patients call with assistance of [employer], # [phone]. Called phone number [phone] and received message that phone no longer in service.   Called [phone] and no answer. LM for patient to call the office at her convenience.

## 2021-05-07 NOTE — Telephone Encounter (Signed)
She was in the hospital yesterday, she have questions about her medication

## 2021-05-07 NOTE — Telephone Encounter (Signed)
Mailed patient her GFE with a est patient letter and map.  

## 2021-05-08 ENCOUNTER — Other Ambulatory Visit: Payer: Self-pay

## 2021-05-08 MED ORDER — METOCLOPRAMIDE HCL 10 MG PO TABS: 10.0000 mg | ORAL_TABLET | Freq: Four times a day (QID) | ORAL | 0 refills | Status: DC

## 2021-05-08 NOTE — Addendum Note (Signed)
Addended by: Ed Blalock on: 05/08/2021 10:13 AM   Modules accepted: Orders

## 2021-05-08 NOTE — Telephone Encounter (Addendum)
Called patient with assistance of [employer] # [phone]. We were transferred to a [place] Interpreter, [phone].  Called [phone] and spoke with husband and he gave the phone to patient.   Patient reports she has sever vomiting and making her tired and weak. She is asking about her medication for N/V. Reglan was ordered recently but did not transmit to the pharmacy, reordered.   Patient reports she is going to the ED for care as she is feeling so bad for a week. Reviewed is not able to hold food or water down for almost a week. Informed her to go to the MAU at the [facility] (Entrance C) for evaluation and assistance.   Patient voiced understanding and will call with any other questions or concerns.

## 2021-05-12 ENCOUNTER — Other Ambulatory Visit: Payer: Self-pay

## 2021-05-12 ENCOUNTER — Encounter: Payer: Self-pay | Admitting: *Deleted

## 2021-05-12 ENCOUNTER — Ambulatory Visit: Payer: Medicaid Other | Attending: Obstetrics and Gynecology

## 2021-05-12 ENCOUNTER — Other Ambulatory Visit: Payer: Self-pay | Admitting: *Deleted

## 2021-05-12 ENCOUNTER — Ambulatory Visit: Payer: Medicaid Other | Admitting: *Deleted

## 2021-05-12 ENCOUNTER — Other Ambulatory Visit: Payer: Self-pay | Admitting: Obstetrics and Gynecology

## 2021-05-12 VITALS — BP 133/84 | HR 92

## 2021-05-12 DIAGNOSIS — O0990 Supervision of high risk pregnancy, unspecified, unspecified trimester: Secondary | ICD-10-CM | POA: Diagnosis not present

## 2021-05-12 DIAGNOSIS — O24012 Pre-existing diabetes mellitus, type 1, in pregnancy, second trimester: Secondary | ICD-10-CM

## 2021-05-12 IMAGING — US US MFM OB FOLLOW-UP
1 series · 13 of 28 positions shown · non-contrast
Comparison: none

[Series 1: us mfm ob follow-up · 68 acquisitions, 13 frames shown]
[im 3/68]
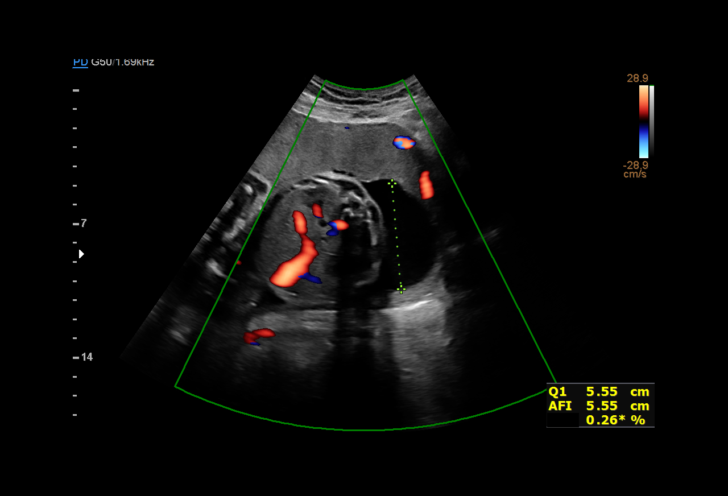
[im 8/68]
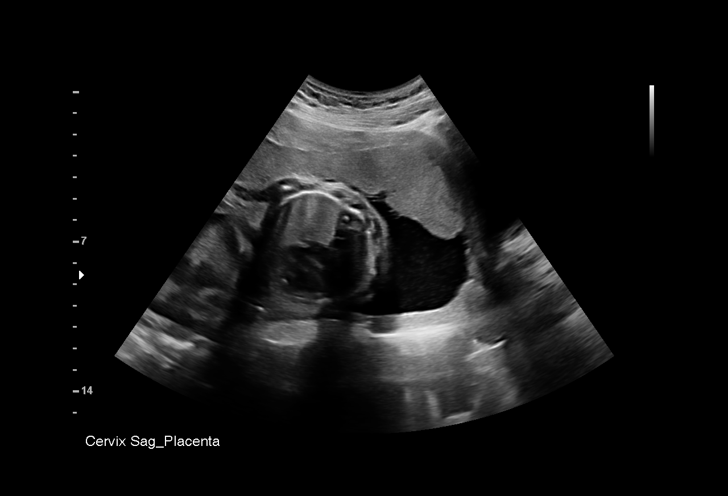
[im 13/68]
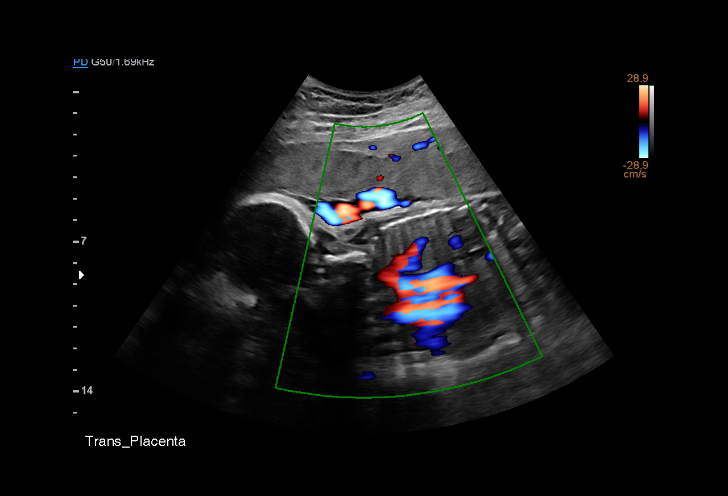
[im 18/68]
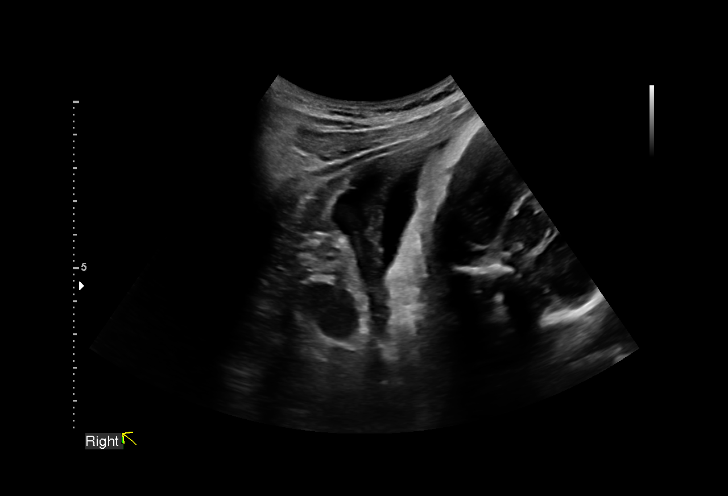
[im 23/68]
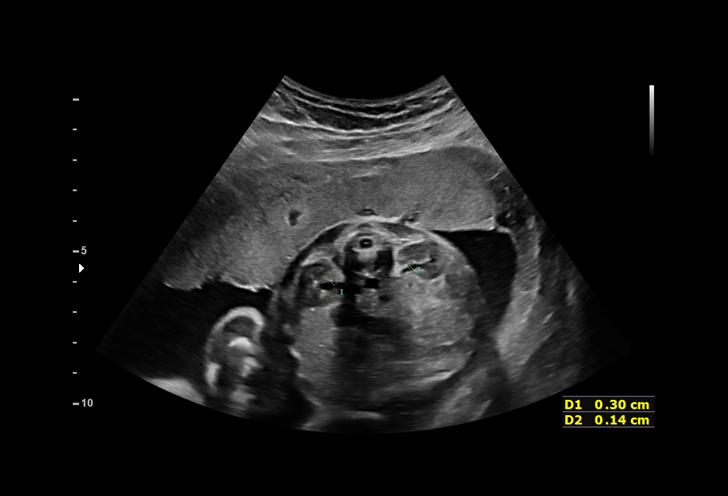
[im 28/68]
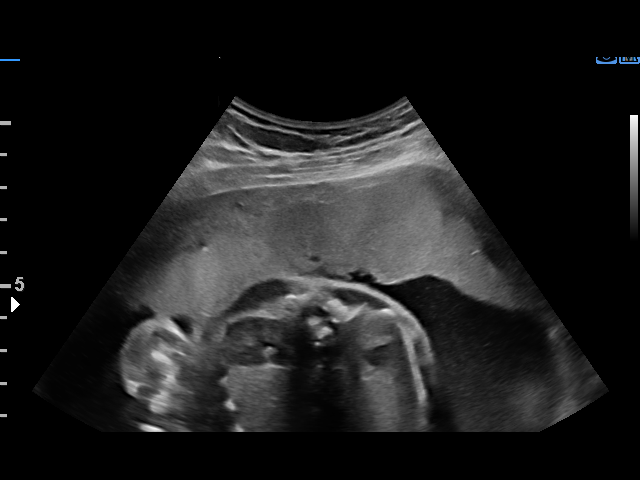
[im 35/68]
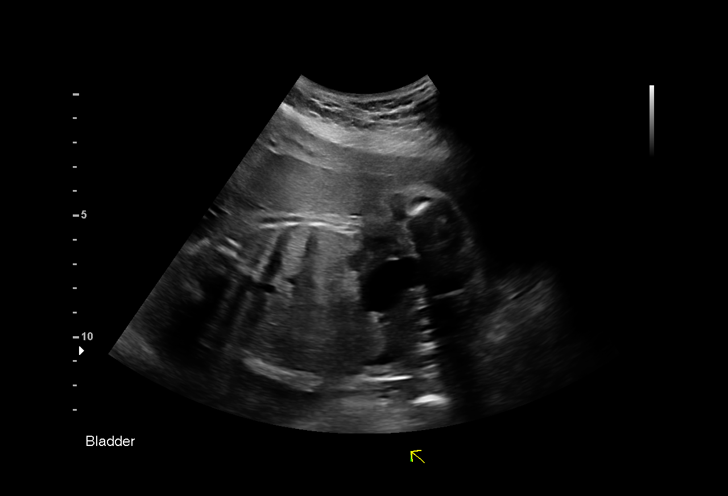
[im 40/68]
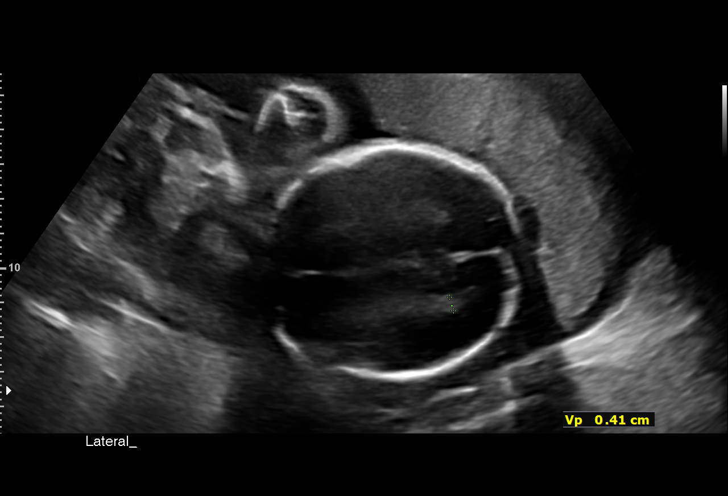
[im 45/68]
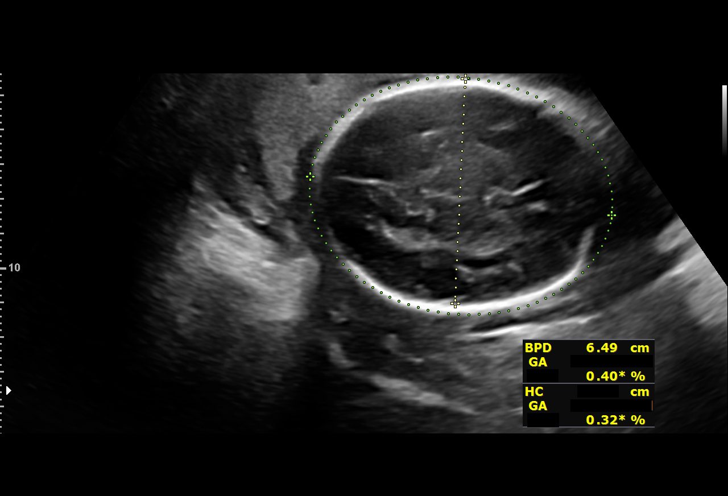
[im 50/68]
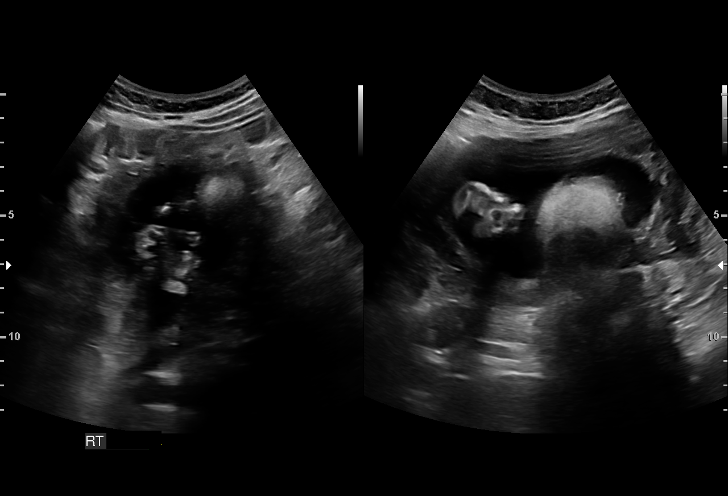
[im 55/68]
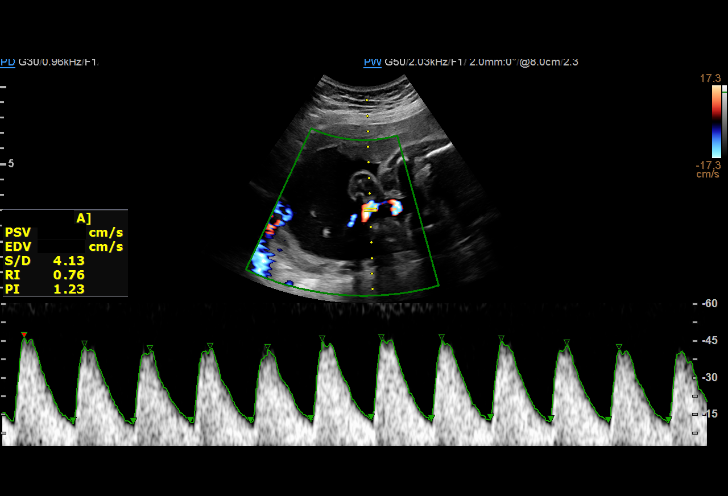
[im 60/68]
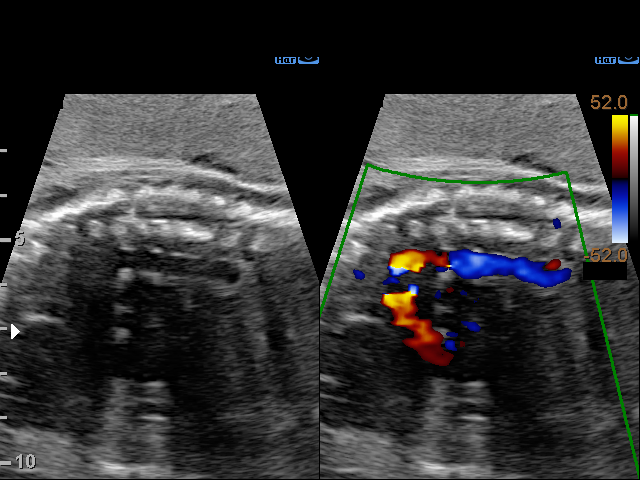
[im 65/68]
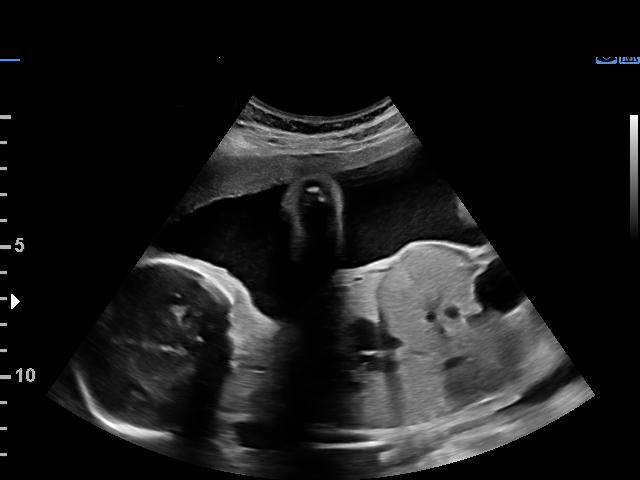

[13 of 28 positions shown; findings below may reference images not displayed]

KAPELANA

Indications

 27 weeks gestation of pregnancy
 Pre-existing diabetes, type 1, in pregnancy,   [28]
 second trimester
 Maternal care for known or suspected poor      [28]
 fetal growth, second trimester, not applicable
 or unspecified IUGR
 Insufficient Prenatal Care (transferred from   [28]
 [place])
Fetal Evaluation

 Num Of Fetuses:         1
 Fetal Heart Rate(bpm):  152
 Cardiac Activity:       Observed
 Presentation:           Cephalic
 Placenta:               Anterior
 P. Cord Insertion:      Visualized

 Amniotic Fluid
 AFI FV:      Within normal limits

 AFI Sum(cm)     %Tile       Largest Pocket(cm)
 14.6            50

 RUQ(cm)       RLQ(cm)       LUQ(cm)        LLQ(cm)

Biometry

 BPD:      65.5  mm     G. Age:  26w 3d         21  %    CI:        73.05   %    70 - 86
                                                         FL/HC:      19.8   %    18.6 -
 HC:      243.6  mm     G. Age:  26w 3d         10  %    HC/AC:      1.10        1.05 -
 AC:      222.4  mm     G. Age:  26w 5d         31  %    FL/BPD:     73.6   %    71 - 87
 FL:       48.2  mm     G. Age:  26w 1d         14  %    FL/AC:      21.7   %    20 - 24
 HUM:      44.7  mm     G. Age:  26w 4d         35  %

 LV:        4.1  mm

 Est. FW:     938  gm      2 lb 1 oz     19  %
OB History

 Gravidity:    2         Term:   0        Prem:   0        SAB:   1
 TOP:          0       Ectopic:  0        Living: 0
Gestational Age

 LMP:           28w 6d        Date:  [DATE]                 EDD:   [DATE]
 U/S Today:     26w 3d                                        EDD:   [DATE]
 Best:          27w 0d     Det. By:  U/S  ([DATE])          EDD:   [DATE]
Anatomy

 Cranium:               Appears normal         Aortic Arch:            Previously seen
 Cavum:                 Appears normal         Ductal Arch:            Appears normal
 Ventricles:            Appears normal         Diaphragm:              Appears normal
 Choroid Plexus:        Appears normal         Stomach:                Appears normal, left
                                                                       sided
 Cerebellum:            Previously seen        Abdomen:                Appears normal
 Posterior Fossa:       Previously seen        Abdominal Wall:         Previously seen
 Nuchal Fold:           Not applicable (>20    Cord Vessels:           Previously seen
                        wks GA)
 Face:                  Orbits and profile     Kidneys:                Appear normal
                        previously seen
 Lips:                  Appears normal         Bladder:                Appears normal
 Thoracic:              Appears normal         Spine:                  Appears normal
 Heart:                 Appears normal         Upper Extremities:      Previously seen
                        (4CH, axis, and
                        situs)
 RVOT:                  Previously seen        Lower Extremities:      Previously seen
 LVOT:                  Previously seen

 Other:  Female gender previously visualized. Heels previously visualized.
         Nasal bone previously visualized. Right open hand visualized.
         IVC/SVC visualized. Technically difficult due to fetal position.
Doppler - Fetal Vessels

 Umbilical Artery
  S/D     %tile      RI    %tile      PI    %tile            ADFV    RDFV
  3.69       77    0.73       78    1.16       75               No      No

Cervix Uterus Adnexa
 Cervix
 Not visualized (advanced GA >[28])

 Uterus
 No abnormality visualized.

 Right Ovary
 Not visualized.

 Left Ovary
 Within normal limits.

 Cul De Sac
 No free fluid seen.

 Adnexa
 No abnormality visualized.
Impression

 Patient returned for fetal growth assessment. Her pregnancy
 is dated by LMP date and there was a 13-day discrepancy
 (smaller biometry) on previous scan.
 Patient reports she had first-trimester ultrasound in [place] and
 has the report at home.
 She was admitted for blood glucose control last month. In
 [DATE], she had DKA that was corrected after
 admission.
 On today's ultrasound, the fetal biometry lags gestational age
 by more than 2 weeks. Amniotic fluid is normal and good fetal
 activity is seen. Umbilical artery Doppler, performed because
 of suspected fetal growth restriction, showed normal forward
 diastolic flow. Left hand could still not be assessed because
 of fetal position.
 I explained the discrepancy in fetal biometry and
 recommended that we assign her EDD based on first
 ultrasound performed at our office (EDD [DATE]).
 I advised the patient to bring her first trimester ultrasound
 report (with images if she has one) tomorrow at her prenatal
 visit (I will send a message to Dr. KAPELANA).
 If accuracy of first-trimester ultrasound can be verified, we will
 amend her EDD again. Today, we have assigned her EDD at
 [DATE] and the patient was informed of the change.
 Patient does not want a male physician to perform ultrasound.
 I explained that diabetes increases the risk of fetal congenital
 malformations and recommended fetal echocardiography. I
 had not addressed control of diabetes today.
Recommendations

 -An appointment was made for her to return in 4 weeks for
 fetal growth assessment.
 -We have requested an appointment for fetal
 echocardiography (KAPELANA).
 -Please scan her first-trimester ultrasound (if available) for us
 to review dating of her pregnancy.
                 KAPELANA

## 2021-05-13 ENCOUNTER — Ambulatory Visit (INDEPENDENT_AMBULATORY_CARE_PROVIDER_SITE_OTHER): Payer: Self-pay | Admitting: Obstetrics & Gynecology

## 2021-05-13 ENCOUNTER — Ambulatory Visit: Payer: Self-pay | Admitting: Registered"

## 2021-05-13 ENCOUNTER — Telehealth: Payer: Self-pay

## 2021-05-13 VITALS — BP 132/96 | HR 89 | Wt 106.2 lb

## 2021-05-13 DIAGNOSIS — Z3A27 27 weeks gestation of pregnancy: Secondary | ICD-10-CM

## 2021-05-13 DIAGNOSIS — O24012 Pre-existing diabetes mellitus, type 1, in pregnancy, second trimester: Secondary | ICD-10-CM

## 2021-05-13 DIAGNOSIS — Z789 Other specified health status: Secondary | ICD-10-CM

## 2021-05-13 DIAGNOSIS — E1010 Type 1 diabetes mellitus with ketoacidosis without coma: Secondary | ICD-10-CM

## 2021-05-13 DIAGNOSIS — Z6791 Unspecified blood type, Rh negative: Secondary | ICD-10-CM

## 2021-05-13 DIAGNOSIS — O0990 Supervision of high risk pregnancy, unspecified, unspecified trimester: Secondary | ICD-10-CM

## 2021-05-13 DIAGNOSIS — R112 Nausea with vomiting, unspecified: Secondary | ICD-10-CM

## 2021-05-13 DIAGNOSIS — O10919 Unspecified pre-existing hypertension complicating pregnancy, unspecified trimester: Secondary | ICD-10-CM

## 2021-05-13 DIAGNOSIS — O26893 Other specified pregnancy related conditions, third trimester: Secondary | ICD-10-CM

## 2021-05-13 MED ORDER — PANTOPRAZOLE SODIUM 40 MG PO TBEC: 40.0000 mg | DELAYED_RELEASE_TABLET | Freq: Every day | ORAL | 2 refills | Status: DC

## 2021-05-13 MED ORDER — RHO D IMMUNE GLOBULIN 1500 UNIT/2ML IJ SOSY
300.0000 ug | PREFILLED_SYRINGE | Freq: Once | INTRAMUSCULAR | Status: AC
  Administered 2021-05-13: 300 ug via INTRAMUSCULAR

## 2021-05-13 MED ORDER — INSULIN NPH (HUMAN) (ISOPHANE) 100 UNIT/ML ~~LOC~~ SUSP
10.0000 [IU] | Freq: Two times a day (BID) | SUBCUTANEOUS | 11 refills | Status: DC
Start: 2021-05-13 — End: 2021-05-22

## 2021-05-13 MED ORDER — ONDANSETRON 4 MG PO TBDP: 4.0000 mg | ORAL_TABLET | Freq: Three times a day (TID) | ORAL | 0 refills | Status: DC | PRN

## 2021-05-13 MED ORDER — LABETALOL HCL 100 MG PO TABS: 100.0000 mg | ORAL_TABLET | Freq: Two times a day (BID) | ORAL | 2 refills | Status: DC

## 2021-05-13 NOTE — Patient Instructions (Signed)
[email or web address]  ????? ?????? ?? ????? Third Trimester of Pregnancy  ???? ????? ?????? ?? ????? ?? ????? ?? ??????? 28 ??? ??????? 40. ????? ??? ?? ????? ?????? ??? ????? ??????. ?????? ?????? ?? ????? ??? ???? ??? ?????? ???? ?? ???? ??? (??????) ????? ?????. ??? ????? ????? ??????? ???? ??? ?????? 20 ???? ??????? ????6-10 ????? ???????. ??????? ????? ?? ????? ????? ?????? ?? ????? ???? ????? ?????? ?? ?????? ????? ????? ?????? ?? ?????????. ???? ?????????????? ???? ???? ???? ???? ??? ?????? ??? ????? ?????. ?????? ????? ?????? ????? ?? ???????. ????? ?? ???? ???? ?????? 25-35 ???? (11-16 ???) ?????? ?????? ??? ??? ????? ??????? ??? ????? ?????. ???? ???? ?????? ?? ??? ????? ??????? ???? ????? ???? ?????? ?????? ??? 28 ?40 ????? (?? ??? 13 ?18 ??? ???????). ??? ??? ???? ?????? ?? ????? ?????? ?????? ???? ????? ???? ?????? ?????? ??? 15 ?25 ????? (?? ??? 7 ?11 ??? ???????). ??? ???? ???? ?????? ?????? ??? ??????? ?????? ????????. ?????? ??? ?????? ??? ?????? ???? ?????. ???? ?? ????? ???? ???? ?? ????? ???? (?????). ????? ??? ?????? ????? ???? ?????? ??????. ?? ???? ???????? ?? ?????. ????? ?? ???? ??? ?????????? ????? ???? ????? ??????? ??????????? ?? ?????. ?? ????? ??? ?????? ????? ?? ????? ?????? ?? ????? ????? ?? ????? ?? ???? ???? ????? ????? ?? ??????. ??? ???? ???? ??????. ?????? ?????? ???? ???? ?? ?????? ?? ????? ?? ????????. ?????? ???? ?? ?????? ?? ???? ?? ?? ??????. ?? ?????? ????????. ?? ?????? ?????????. ?? ???? ??????? ?? ?????? ?????? (????? ??????) ?? ?????. ?? ???? ???? ????? ?? ???? ????? ?? ????? ?? ????? ?? ???????. ???? ???? ??? ????? ????? ?????? ????????? ???? ???? ??? ??????? ???????. ?? ?????? ?????? ?? ????? ?? ??????? ?? ?????? ????????? ????????. ???? ?? ?????? ???? ?? ??? ????. ?? ?????? ???? ?? ?????? ???? ???? ?????. ??????? ???? ?? ??????? ?????? ????? ??? ?????? ????? ???? ????? ????? ???  ???????. ?? ???? ???? ?????? ?? ??????? ?? ?????. ??? ???? ??? ??? ??? ????? ?????? ??? ???? ??????? ?????? ??????? ??????? ?? ?????. ?? ??????? ??? ????? "????" ?? ????? ??? ???? ????? (?????). ?? ????? ????? ?? ????????? ????????. ?? ??????? ????? ??? ???? ????? ?? ???? ?????. ????? ????????? ??????? ?? ???????: ??????? ????? ??????? ????? ??????? ?????? ????? ????? ???????. ??? ????? ?? ???? ???? ?? ?? ??? ????? ??????? ?? ????? ?????. ??? ?? ??????? ????? ????? ??? ??? ?????? ???? ??????? ??????. ?????? ????????? ?? ??? ??????? ???? ????? ??? 600 ????????? (mcg) ?? ??? ???????. ????? ?????? ????? ?????? ??????? ????? ????? ??? ????? ????? ???????? ????? ????? ?????? ???? ???????? ??? ??????? ?????? ??????? ??????? ??????? ????? ?????. ????? ?????? ?????? ???????? ??? ???????? ?? ??????? ??????? ??????. ???? ????? ????? ??? ?????? ???? ?? ???? ???? ?????. ?????? 4 ?? 5 ????? ????? ?????? ????? ?? 3 ????? ?????. ????? ??????? ??? ????? ??? ????????? ??????? ?? ???????? ?? ????? ?? ???? ?????: ????? ????? ????? ?? ??????? ???? ??? ????? ???? ??????. ?????? ??????? ?????? ???????? ??? ????????? ??????? ??????? ???????? ?????????? ???????. ???? ????? ??????? ???? ????? ??? ??? ????? ?? ?????? ????????? ??????? ??? ??????? ??????? ?? ????????. ?????? ????? ??????? ??? ??????? ???? ??????? ??????. ???? ????? ?????? ??????? ????????? ?? ????? ???? ???????? ??????? ??? ?? ????? ?????. ????? ?????? ??????? 30 ????? ??? ????? ?????? ?? 5 ???? ?? ???????. ????? ?? ?????? ???????? ??? ???? ?????? ????????? ?? ?????. ????? ?? ?????? ???????? ??? ???? ???? ?? ?????? ?? ????? ????? ?? ???? ?????. ????? ??? ??????? ???????. ?? ?????? ???????? ???????? ??? ??? ???? ????? ???? ?? ????? ???? ?? ??? ???? ?? ???? ????? ?? ??? ?????. ??? ?????? ?????? ????????? ?? ?????? ??????? ???????? ?? ?? ???? ???? ???? ??????? ?????? ??????? ??? ?????? ????. ????? ?????? ?????? ???? ???????? ??????? ??? ????? ??????? ??  ??? (?????) ?????? ??? ???? ?????? ?? ?????? ????? ?? ???? ?? ???? ?????. ??? ?????? ????? ????? ?????? ????? ?? ??????? ?????? ?? ????????. ??????? ???? ??????? ??? ???? ???? ??????? ?????? ??? ???. ????? ????? ??? ???? ??? ????? ????? ????? ?????? ???? ?? ?????. ??? ?????? ???????? ????????: ????? ??????? ??????? ??????? ??? ??????? ???? ??????? ??????. ????? ????? ????  15 ????? ?? ??? 3-4 ???? ??????. ???? ????? ?? ?????? ???????. ??????? ??????? ???? ??????? ?????? ??????? ?? ??? ????? ??????? ?????. ?? ??????? ????? ?????? ??????? ?? ??? ?????? ?? ???????. ????? ???? ?????? ???? ??? ??????? ?????? ?? ????? ????. ??????? ??????? ??????? ?????? ??????? ??? ??? ???? ??????? ??????? ????? ?? ?????? ????. ??????? ??????? ??????? ????? ????: ????? ???? ?? ??? ??????? ???? ?????? ????????? ?????? ??? ?????? ???? ??????? ??? ???. ????? ????? ???????? ???? ????? ?? ????? ???????. ????? ???? ????? ????? ????? ?? ?????? ?????? ?? ????? ????? ?????? ?? ??????. ???? ???? ????? ?? ???? ????. ????? ?? ????? ???? ??????? ?????????? ??????? ?? ???? ????? ???? ????????. ??????? ???? ????? ???????? ?? ?????? ????? ????? ??????? ????????? ?? ??? ?????. ??? ??? ??????? ???? ?????? ???? ?? ???? ???? ????? ??? ?????. ??? ??? ????? ???? ????? ?? ??? ?? ?? ???? ??? ????? ???????. ?? ??????? ???? ??????? ?? ???????? ??????? ????????. ?? ??????? ????? ?????? ???????. ?? ??????? ?? ?????? ?????? ????? ??? ????????? ?? ????? ??? ??????? ???????? ??????????? ???? ?????. ???????? ???? ??????? ?????? ??? ????? ??? ???????? ??????? ?? ???????. ?? ??????? ?????? ????? ?? ?????? ??? ??????? ?? ????? ?? ???? ???. ?????? ?? ?????????? ???????? ?? ??? ???????? ?? ??? ?????. ?? ??????? ????????? ???????? ???????. ????? ???? ???? ?? ??? ??????? ???? ????? ?????? ?? ?????. ???? ???????? ??????? ??????? ??????? (?????? ?????? ?????)? ????? ???? ??????? ?????? ??? ????? ?????? ???? ?????? ??????? ?????? ???? ???? ??????. ??????  ????? ?????? ????????. ???? ??? ???. ????? ?????? ??? ?????? ?? ?????????: ??????? ????????? ????? (American Pregnancy Association):? [email or web address] ?????? ????????? ??? ?????? ???????? [employer]):?? [email or web address] ???? ??? ?????? (Office on Women's Health):? [email or web address] ????? ???????? ??????? ?????? ??? ????? ????? ??????? ???????: ??????? ???????. ?????? ??????? ????? ??????? ?? ??? ?? ?????? ?? ??? ???? ?? ????? ??????? ?? ???? ??????. ???? ?? ?????. ??????? ?????? ????? ???????? ?? ??? ???? ???????. ???? ??? ??? ??????. ???? ???? ?? ???? ??? ?? ????? ??????. ?????? ?????? ?? ?????? ??? ???????? ?? ???? ??? ?? ???? ?????. ????? ???????? ????? ?? ??????? ???????: ???? ?????. ???? ???????? ?????? ???? ????? ??? ?? 5 ?????. ???? ????? ?? ???? ?? ??????. ?????? ???? ??? ?? ?????. ??????? ?????? ?? ??????. ?????? ???? ?? ?????. ?????? ?????? ?????. ??? ?????? ????? ????? ???????? ??? ?????? ???? ????? ??? ???? ??????? ?????? ??????? ???. ?????? ???? ???? ?? ?????? ?? ???? ?? ?????? ?? ?????? ?? ?????. ???? ???? ????? ?????? ?? ????? ????? ?? ??????? 28 ??? ??????? 40 (?? ????? ?????? ??? ????? ??????). ?? ???? ???? ?????? ?? ??????? ?? ?????. ??? ???? ??? ??? ??? ????? ?????? ??? ???? ??????? ?????? ??????? ??????? ?? ?????. ????? ???? ???? ?? ??? ??????? ???? ????? ?????? ?? ?????. ?????? ????? ?????? ????????. ???? ??? ???. ??? ????? ?? ??? ????????? ?? ???? ?????? ????????? ???? ?????? ???? ?????????????. ???? ?? ?????? ??? ????? ???? ?? ???? ?? ???? ??????? ??????.? Document Revised: 05/13/2020 Document Reviewed: 05/13/2020 Elsevier Patient Education  2022 [employer].

## 2021-05-13 NOTE — Telephone Encounter (Signed)
Mailed patient her GFE with a est patient letter and map.  

## 2021-05-13 NOTE — Progress Notes (Signed)
Pt not seen. Patient arrived at appointment time for Diabetes education but was scheduled to see MD first and not enough time left at MD visit. Pt rescheduled to be seen on a day when she could have Diabetes education prior to seeing the doctor.

## 2021-05-13 NOTE — Progress Notes (Signed)
   PRENATAL VISIT NOTE  Subjective:  Kristin Clay is a 35 y.o. G2P0010 at [redacted]w[redacted]d being seen today for ongoing prenatal care.  She is currently monitored for the following issues for this high-risk pregnancy and has DKA (diabetic ketoacidoses); Nausea & vomiting; Acute lower UTI; Syncope; Female circumcision; Language barrier; Type 1 diabetes mellitus with ketoacidosis, uncontrolled (HCC); DKA, type 1 (HCC); Acute pyelonephritis; Supervision of high risk pregnancy, antepartum; Rh negative status during pregnancy; [redacted] weeks gestation of pregnancy; 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, second trimester; History of diabetic ketoacidosis; and Late prenatal care affecting pregnancy in second trimester on their problem list.  Patient reports headache, heartburn, nausea, and vomiting.  Contractions: Not present. Vag. Bleeding: None.  Movement: Present. Denies leaking of fluid.   The following portions of the patient's history were reviewed and updated as appropriate: allergies, current medications, past family history, past medical history, past social history, past surgical history and problem list.   Objective:   Vitals:   05/13/21 1027 05/13/21 1036  BP: (!) 139/103 (!) 132/96  Pulse: 96 89  Weight: 106 lb 3.2 oz (48.2 kg)     Fetal Status: Fetal Heart Rate (bpm): 142   Movement: Present     General:  Alert, oriented and cooperative. Patient is in no acute distress.  Skin: Skin is warm and dry. No rash noted.   Cardiovascular: Normal heart rate noted  Respiratory: Normal respiratory effort, no problems with respiration noted  Abdomen: Soft, gravid, appropriate for gestational age.  Pain/Pressure: Absent     Pelvic: Cervical exam deferred        Extremities: Normal range of motion.  Edema: None  Mental Status: Normal mood and affect. Normal behavior. Normal judgment and thought content.   Assessment and Plan:  Pregnancy: G2P0010 at [redacted]w[redacted]d 1. Rh negative status  during pregnancy in third trimester  - rho (d) immune globulin (RHIG/RHOPHYLAC) injection 300 mcg  2. 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, second trimester  - insulin NPH Human (NOVOLIN N) 100 UNIT/ML injection; Inject 0.1 mLs (10 Units total) into the skin 2 (two) times daily at 8 am and 10 pm.  Dispense: 10 mL; Refill: 11 - pantoprazole (PROTONIX) 40 MG tablet; Take 1 tablet (40 mg total) by mouth daily.  Dispense: 30 tablet; Refill: 2 No record but FBS 200 and 2 hr PP 130-140's 3. Supervision of high risk pregnancy, antepartum F/u with MFM  4. [redacted] weeks gestation of pregnancy Need to improve control  5. Type 1 diabetes mellitus with ketoacidosis, uncontrolled (HCC)   6. Language barrier Arabic interpreter  7. Nausea and vomiting, intractability of vomiting not specified, unspecified vomiting type Add PPI - ondansetron (ZOFRAN-ODT) 4 MG disintegrating tablet; Take 1 tablet (4 mg total) by mouth every 8 (eight) hours as needed for nausea or vomiting.  Dispense: 8 tablet; Refill: 0 CHTN, was dx prior to pregnancy per patient but did not take the medication she was prescribed Preterm labor symptoms and general obstetric precautions including but not limited to vaginal bleeding, contractions, leaking of fluid and fetal movement were reviewed in detail with the patient. Please refer to After Visit Summary for other counseling recommendations.   Return in about 1 week (around 05/20/2021).  Future Appointments  Date Time Provider Department Center  06/09/2021  9:15 AM [facility] NURSE [facility]  06/09/2021  9:30 AM [facility] US3 [facility]  Will need to see diabetes nutritionist asap  Scheryl Darter, MD

## 2021-05-13 NOTE — Progress Notes (Signed)
PT did not bring Glucose log today. Pt also states is having a lot of nausea & vomiting.

## 2021-05-20 ENCOUNTER — Ambulatory Visit (INDEPENDENT_AMBULATORY_CARE_PROVIDER_SITE_OTHER): Payer: Self-pay | Admitting: Family Medicine

## 2021-05-20 ENCOUNTER — Other Ambulatory Visit: Payer: Self-pay

## 2021-05-20 ENCOUNTER — Encounter: Payer: Self-pay | Admitting: Family Medicine

## 2021-05-20 VITALS — BP 121/82 | HR 81 | Wt 115.6 lb

## 2021-05-20 DIAGNOSIS — Z6791 Unspecified blood type, Rh negative: Secondary | ICD-10-CM

## 2021-05-20 DIAGNOSIS — O0990 Supervision of high risk pregnancy, unspecified, unspecified trimester: Secondary | ICD-10-CM

## 2021-05-20 DIAGNOSIS — O10919 Unspecified pre-existing hypertension complicating pregnancy, unspecified trimester: Secondary | ICD-10-CM

## 2021-05-20 DIAGNOSIS — O24012 Pre-existing diabetes mellitus, type 1, in pregnancy, second trimester: Secondary | ICD-10-CM

## 2021-05-20 DIAGNOSIS — Z758 Other problems related to medical facilities and other health care: Secondary | ICD-10-CM

## 2021-05-20 DIAGNOSIS — Z789 Other specified health status: Secondary | ICD-10-CM

## 2021-05-20 DIAGNOSIS — O26893 Other specified pregnancy related conditions, third trimester: Secondary | ICD-10-CM

## 2021-05-20 DIAGNOSIS — N90810 Female genital mutilation status, unspecified: Secondary | ICD-10-CM

## 2021-05-20 NOTE — Progress Notes (Signed)
Subjective:  Kristin Clay is a 35 y.o. G2P0010 at [redacted]w[redacted]d being seen today for ongoing prenatal care.  She is currently monitored for the following issues for this high-risk pregnancy and has DKA (diabetic ketoacidoses); Nausea & vomiting; Acute lower UTI; Syncope; Female circumcision; Language barrier; Type 1 diabetes mellitus with ketoacidosis, uncontrolled (HCC); DKA, type 1 (HCC); Acute pyelonephritis; Supervision of high risk pregnancy, antepartum; Rh negative status during pregnancy; [redacted] weeks gestation of pregnancy; 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, second trimester; History of diabetic ketoacidosis; Late prenatal care affecting pregnancy in second trimester; and Chronic hypertension affecting pregnancy on their problem list.  Patient reports headache.  Contractions: Not present. Vag. Bleeding: None.  Movement: Absent. Denies leaking of fluid.   The following portions of the patient's history were reviewed and updated as appropriate: allergies, current medications, past family history, past medical history, past social history, past surgical history and problem list. Problem list updated.  Objective:   Vitals:   05/20/21 1534  BP: 121/82  Pulse: 81  Weight: 115 lb 9.6 oz (52.4 kg)    Fetal Status: Fetal Heart Rate (bpm): 140   Movement: Absent     General:  Alert, oriented and cooperative. Patient is in no acute distress.  Skin: Skin is warm and dry. No rash noted.   Cardiovascular: Normal heart rate noted  Respiratory: Normal respiratory effort, no problems with respiration noted  Abdomen: Soft, gravid, appropriate for gestational age. Pain/Pressure: Absent     Pelvic: Vag. Bleeding: None     Cervical exam deferred        Extremities: Normal range of motion.  Edema: None  Mental Status: Normal mood and affect. Normal behavior. Normal judgment and thought content.   Urinalysis:      Assessment and Plan:  Pregnancy: G2P0010 at [redacted]w[redacted]d  1.  Supervision of high risk pregnancy, antepartum BP normal, FHR normal Unfortunately reports 3 days of basically absent movement Also reports two days of headache not responsive to tylenol Given poorly controlled T1DM, DFM, and cHTN with significantly increased P:C ratio will send to MAU for NST and treatment of headache Signout given to MAU provider  2. 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, second trimester Continues to have poorly controlled sugars Massive swing in sugars between 80's-400's, though majority are in high 100's to low 200's Fastings are uniformly elevated to the low 100's Currently she reports taking NPH 10u BID correctly, but is only taking 3u novolog TID instead of 9u Reviewed lows with patient, she misunderstood and has been taking mealtime insulin even when she has not eaten Increased NPH from 10>12u and asked patient to increase mealtime insulin to 9u as prescribed, emphasized importance of not taking mealtime insulin if she is NPO but to always take NPH Follow up sugars at next visit, may need to discuss early delivery with MFM if current trend continues Last growth [place] 05/12/21, EFW 19%, EDC updated Has fetal echo scheduled early next month  3. Female circumcision Not addressed at this visit Per initial OB visit likely type 3  4. Rh negative status during pregnancy in third trimester S/p rhogam at last visit, unfortunately did not have an Ab screen at that time but was negative less than two weeks prior  5. Language barrier Patient seen with assistance of video interpreter Zahra [phone]  6. Chronic hypertension affecting pregnancy Reports headache for past two days BP normal today On labetalol 100mg  BID See above, sent to MAU  Preterm  labor symptoms and general obstetric precautions including but not limited to vaginal bleeding, contractions, leaking of fluid and fetal movement were reviewed in detail with the patient. Please refer to After Visit  Summary for other counseling recommendations.  Return in 2 weeks (on 06/03/2021) for [facility], ob visit, needs MD.   Venora Maples, MD

## 2021-05-22 ENCOUNTER — Other Ambulatory Visit: Payer: Self-pay

## 2021-05-22 ENCOUNTER — Inpatient Hospital Stay (HOSPITAL_COMMUNITY)
Admission: AD | Admit: 2021-05-22 | Discharge: 2021-05-22 | Disposition: A | Discharge status: Home / Self Care | Payer: Medicaid Other | Attending: Obstetrics & Gynecology | Admitting: Obstetrics & Gynecology

## 2021-05-22 ENCOUNTER — Encounter (HOSPITAL_COMMUNITY): Payer: Self-pay | Admitting: Obstetrics & Gynecology

## 2021-05-22 ENCOUNTER — Inpatient Hospital Stay (HOSPITAL_BASED_OUTPATIENT_CLINIC_OR_DEPARTMENT_OTHER): Payer: Medicaid Other

## 2021-05-22 DIAGNOSIS — G44209 Tension-type headache, unspecified, not intractable: Secondary | ICD-10-CM | POA: Diagnosis not present

## 2021-05-22 DIAGNOSIS — O368130 Decreased fetal movements, third trimester, not applicable or unspecified: Secondary | ICD-10-CM | POA: Diagnosis present

## 2021-05-22 DIAGNOSIS — O212 Late vomiting of pregnancy: Secondary | ICD-10-CM

## 2021-05-22 DIAGNOSIS — Z3689 Encounter for other specified antenatal screening: Secondary | ICD-10-CM

## 2021-05-22 DIAGNOSIS — Z3A28 28 weeks gestation of pregnancy: Secondary | ICD-10-CM

## 2021-05-22 DIAGNOSIS — R112 Nausea with vomiting, unspecified: Secondary | ICD-10-CM

## 2021-05-22 DIAGNOSIS — O360130 Maternal care for anti-D [Rh] antibodies, third trimester, not applicable or unspecified: Secondary | ICD-10-CM

## 2021-05-22 DIAGNOSIS — E10649 Type 1 diabetes mellitus with hypoglycemia without coma: Secondary | ICD-10-CM | POA: Insufficient documentation

## 2021-05-22 DIAGNOSIS — O10913 Unspecified pre-existing hypertension complicating pregnancy, third trimester: Secondary | ICD-10-CM | POA: Insufficient documentation

## 2021-05-22 DIAGNOSIS — O219 Vomiting of pregnancy, unspecified: Secondary | ICD-10-CM

## 2021-05-22 DIAGNOSIS — O24013 Pre-existing diabetes mellitus, type 1, in pregnancy, third trimester: Secondary | ICD-10-CM | POA: Diagnosis not present

## 2021-05-22 DIAGNOSIS — O24012 Pre-existing diabetes mellitus, type 1, in pregnancy, second trimester: Secondary | ICD-10-CM

## 2021-05-22 DIAGNOSIS — Z794 Long term (current) use of insulin: Secondary | ICD-10-CM | POA: Diagnosis not present

## 2021-05-22 DIAGNOSIS — O10919 Unspecified pre-existing hypertension complicating pregnancy, unspecified trimester: Secondary | ICD-10-CM

## 2021-05-22 DIAGNOSIS — O99353 Diseases of the nervous system complicating pregnancy, third trimester: Secondary | ICD-10-CM | POA: Diagnosis not present

## 2021-05-22 DIAGNOSIS — O26893 Other specified pregnancy related conditions, third trimester: Secondary | ICD-10-CM | POA: Diagnosis not present

## 2021-05-22 DIAGNOSIS — O09523 Supervision of elderly multigravida, third trimester: Secondary | ICD-10-CM | POA: Insufficient documentation

## 2021-05-22 DIAGNOSIS — Z79899 Other long term (current) drug therapy: Secondary | ICD-10-CM | POA: Insufficient documentation

## 2021-05-22 DIAGNOSIS — O368190 Decreased fetal movements, unspecified trimester, not applicable or unspecified: Secondary | ICD-10-CM

## 2021-05-22 DIAGNOSIS — O0990 Supervision of high risk pregnancy, unspecified, unspecified trimester: Secondary | ICD-10-CM

## 2021-05-22 DIAGNOSIS — O321XX0 Maternal care for breech presentation, not applicable or unspecified: Secondary | ICD-10-CM

## 2021-05-22 DIAGNOSIS — Z6791 Unspecified blood type, Rh negative: Secondary | ICD-10-CM

## 2021-05-22 DIAGNOSIS — E109 Type 1 diabetes mellitus without complications: Secondary | ICD-10-CM

## 2021-05-22 LAB — URINALYSIS, ROUTINE W REFLEX MICROSCOPIC
Bilirubin Urine: NEGATIVE
Glucose, UA: NEGATIVE mg/dL
Hgb urine dipstick: NEGATIVE
Ketones, ur: NEGATIVE mg/dL
Leukocytes,Ua: NEGATIVE
Nitrite: POSITIVE — AB
Protein, ur: NEGATIVE mg/dL
Specific Gravity, Urine: 1.02 (ref 1.005–1.030)
pH: 8.5 — ABNORMAL HIGH (ref 5.0–8.0)

## 2021-05-22 LAB — CBC
HCT: 36.5 % (ref 36.0–46.0)
Hemoglobin: 12.2 g/dL (ref 12.0–15.0)
MCH: 28.9 pg (ref 26.0–34.0)
MCHC: 33.4 g/dL (ref 30.0–36.0)
MCV: 86.5 fL (ref 80.0–100.0)
Platelets: 269 10*3/uL (ref 150–400)
RBC: 4.22 MIL/uL (ref 3.87–5.11)
RDW: 13.7 % (ref 11.5–15.5)
WBC: 4.4 10*3/uL (ref 4.0–10.5)
nRBC: 0 % (ref 0.0–0.2)

## 2021-05-22 LAB — WET PREP, GENITAL
Clue Cells Wet Prep HPF POC: NONE SEEN
Trich, Wet Prep: NONE SEEN
Yeast Wet Prep HPF POC: NONE SEEN

## 2021-05-22 LAB — PROTEIN / CREATININE RATIO, URINE
Creatinine, Urine: 95.98 mg/dL
Protein Creatinine Ratio: 0.19 mg/mg{Cre} — ABNORMAL HIGH (ref 0.00–0.15)
Total Protein, Urine: 18 mg/dL

## 2021-05-22 LAB — COMPREHENSIVE METABOLIC PANEL
ALT: 8 U/L (ref 0–44)
AST: 12 U/L — ABNORMAL LOW (ref 15–41)
Albumin: 2.6 g/dL — ABNORMAL LOW (ref 3.5–5.0)
Alkaline Phosphatase: 58 U/L (ref 38–126)
Anion gap: 7 (ref 5–15)
BUN: <5 mg/dL — ABNORMAL LOW (ref 6–20)
CO2: 22 mmol/L (ref 22–32)
Calcium: 8.3 mg/dL — ABNORMAL LOW (ref 8.9–10.3)
Chloride: 107 mmol/L (ref 98–111)
Creatinine, Ser: 0.35 mg/dL — ABNORMAL LOW (ref 0.44–1.00)
GFR, Estimated: >60 mL/min (ref >60)
Glucose, Bld: 65 mg/dL — ABNORMAL LOW (ref 70–99)
Potassium: 3.6 mmol/L (ref 3.5–5.1)
Sodium: 136 mmol/L (ref 135–145)
Total Bilirubin: 0.5 mg/dL (ref 0.3–1.2)
Total Protein: 5.9 g/dL — ABNORMAL LOW (ref 6.5–8.1)

## 2021-05-22 LAB — URINALYSIS, MICROSCOPIC (REFLEX)

## 2021-05-22 LAB — GLUCOSE, CAPILLARY
Glucose-Capillary: 56 mg/dL — ABNORMAL LOW (ref 70–99)
Glucose-Capillary: 72 mg/dL (ref 70–99)
Glucose-Capillary: 55 mg/dL — ABNORMAL LOW (ref 70–99)
Glucose-Capillary: 86 mg/dL (ref 70–99)

## 2021-05-22 IMAGING — US US MFM FETAL BPP W/O NON-STRESS
1 series · 13 of 26 positions shown · non-contrast
Comparison: none

[Series 1: us mfm fetal bpp w/o non-stress · 26 acquisitions, 13 frames shown]
[im 2/26]
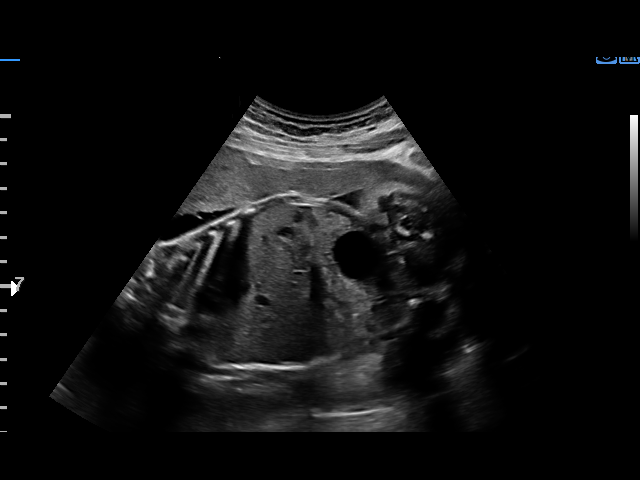
[im 4/26]
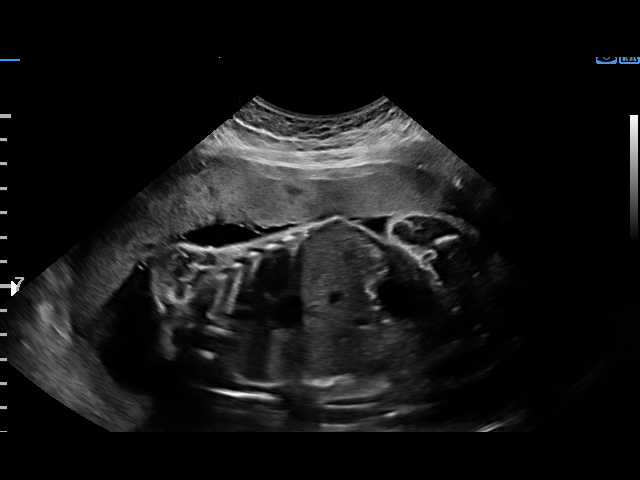
[im 6/26]
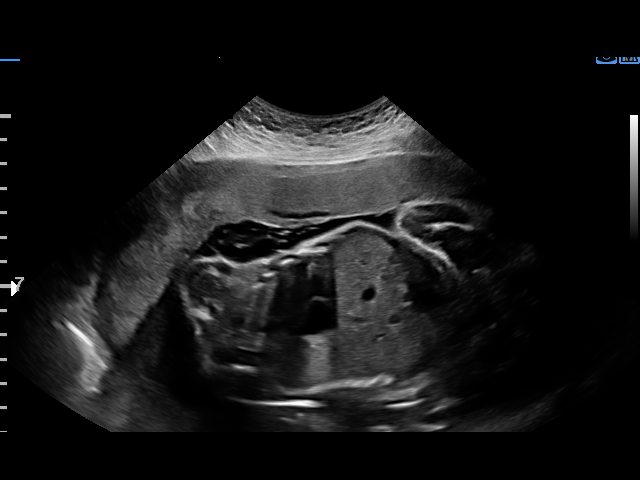
[im 8/26]
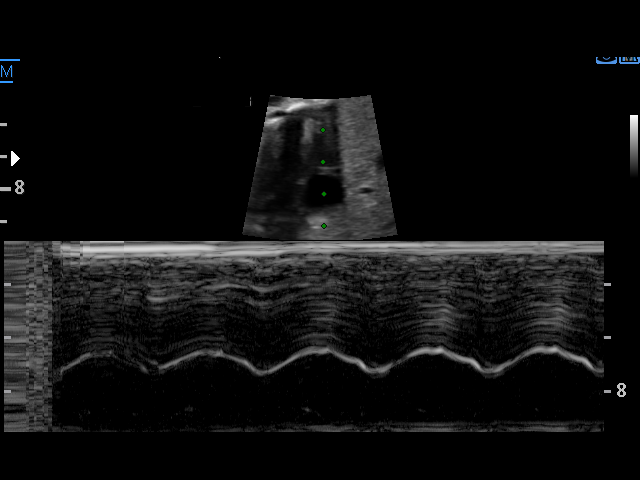
[im 10/26]
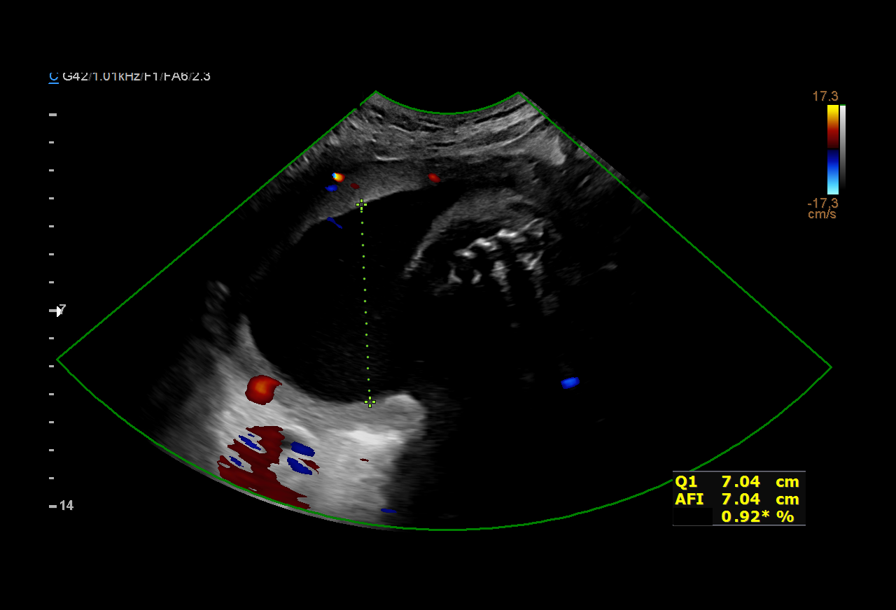
[im 12/26]
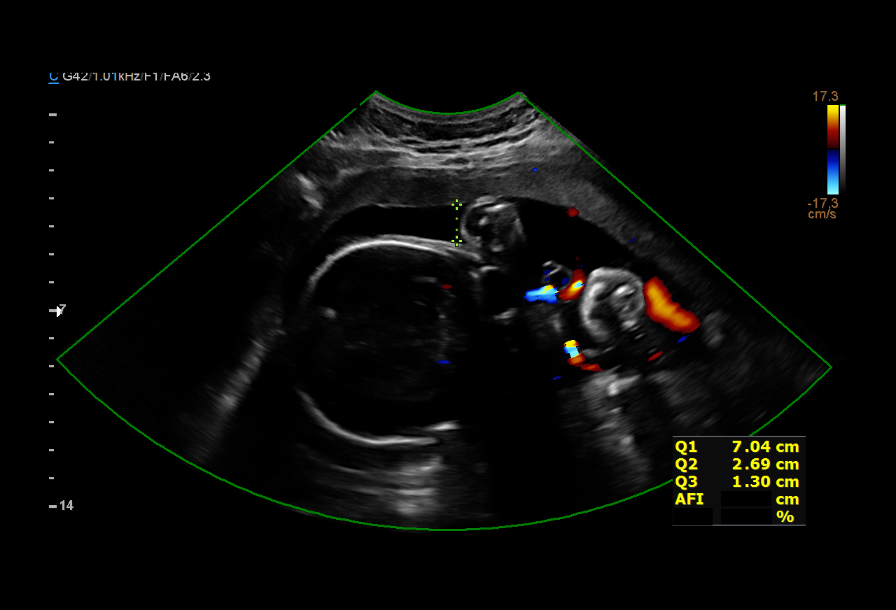
[im 14/26]
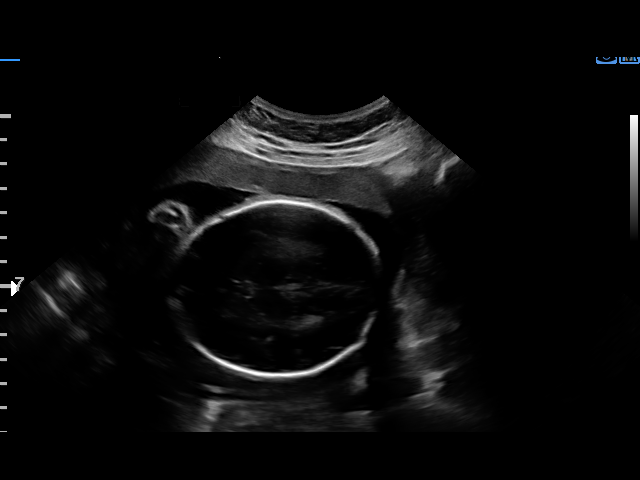
[im 16/26]
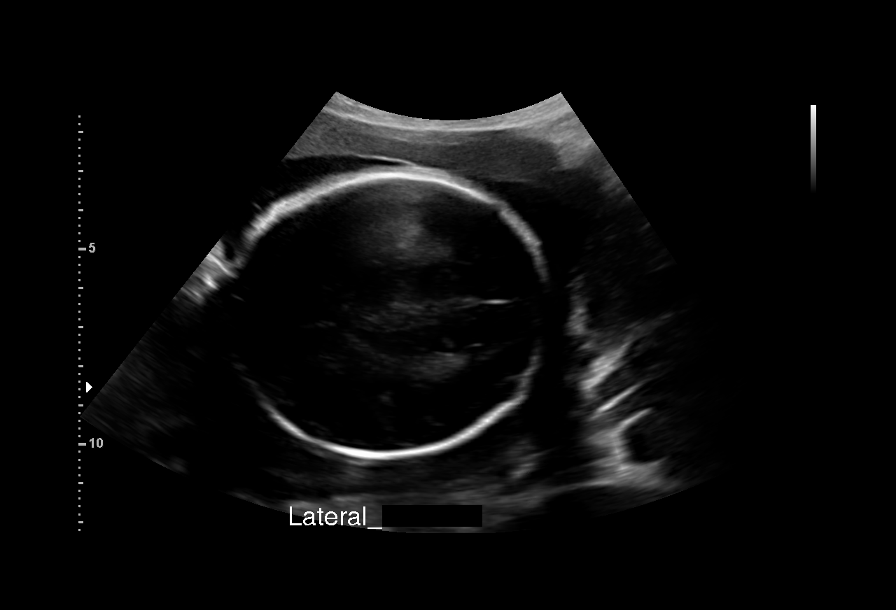
[im 18/26]
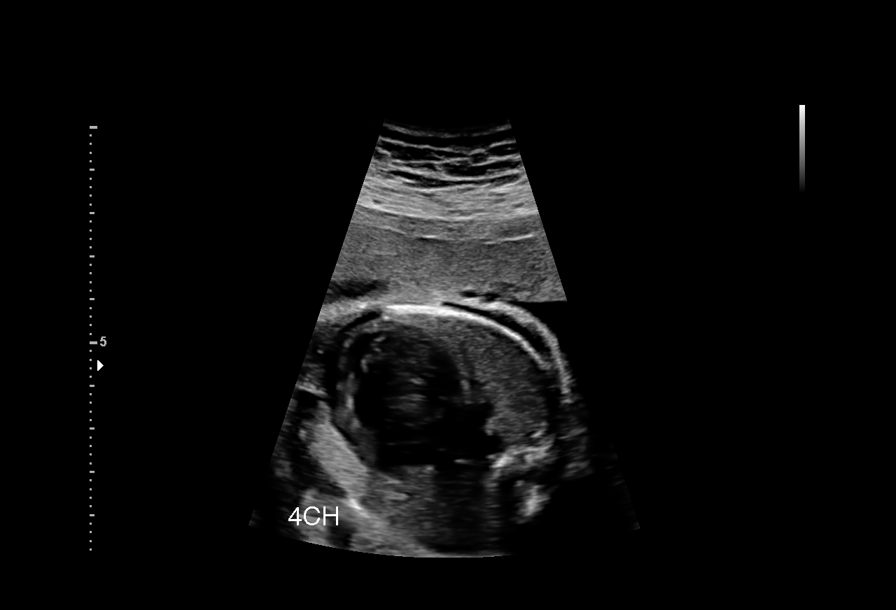
[im 20/26]
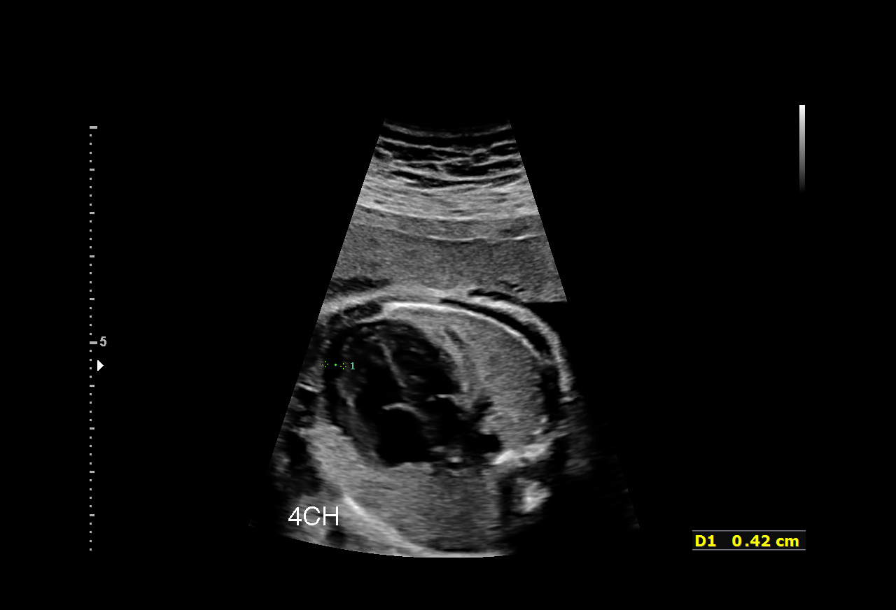
[im 22/26]
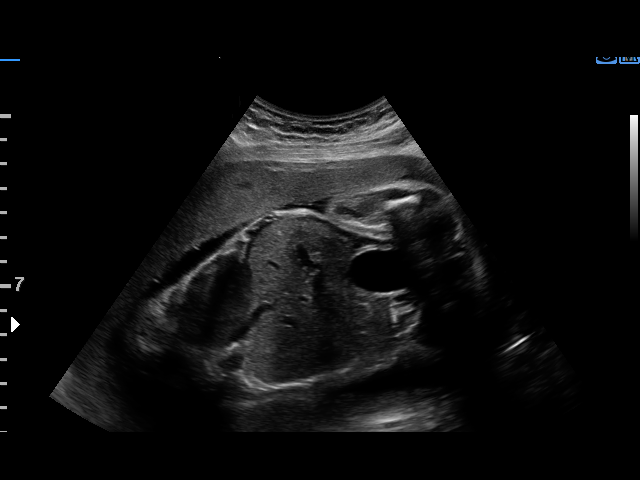
[im 24/26]
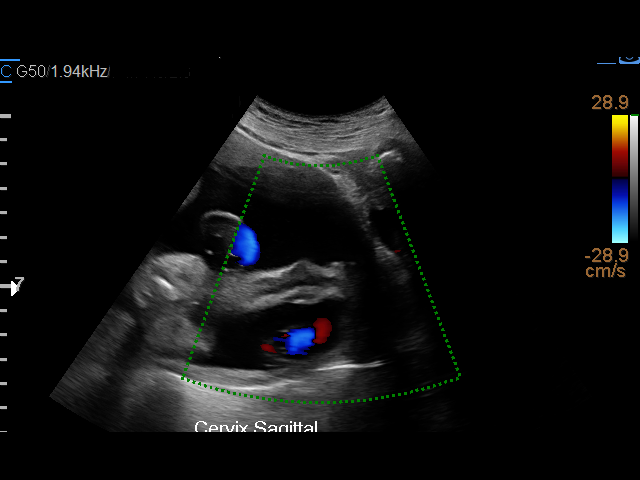
[im 26/26]
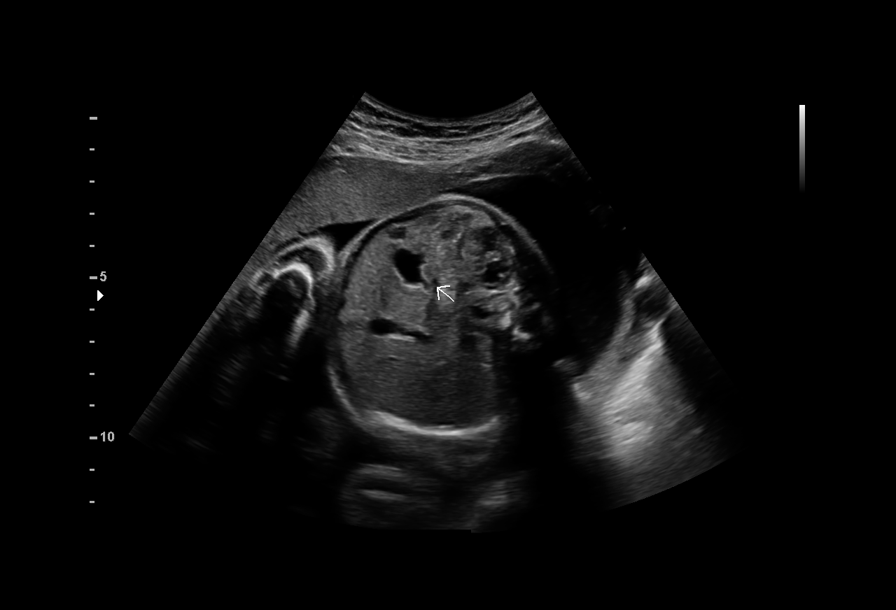

[13 of 26 positions shown; findings below may reference images not displayed]

DELGADO LOPES

 Attending:        DELGADO LOPES        Secondary Phy.:   DELGADO LOPES
                                                            DELGADO LOPES CNM
 Referred By:      [facility]U/Triage         [facility]

Indications

 28 weeks gestation of pregnancy
 Decreased fetal movements, third trimester,    [IN]
 unspecified
Fetal Evaluation

 Num Of Fetuses:         1
 Fetal Heart Rate(bpm):  135
 Cardiac Activity:       Observed
 Presentation:           Breech
 Placenta:               Anterior
 P. Cord Insertion:      Previously Visualized

 Amniotic Fluid
 AFI FV:      Within normal limits

 AFI Sum(cm)     %Tile       Largest Pocket(cm)
 16.9            63          7.

 RUQ(cm)       RLQ(cm)       LUQ(cm)        LLQ(cm)
 7
Biophysical Evaluation

 Amniotic F.V:   Pocket => 2 cm             F. Tone:        Observed
 F. Movement:    Observed                   Score:          [DATE]
 F. Breathing:   Observed
Biometry

 LV:        4.1  mm
OB History

 Gravidity:    2         Term:   0        Prem:   0        SAB:   1
 TOP:          0       Ectopic:  0        Living: 0
Gestational Age

 LMP:           30w 2d        Date:  [DATE]                 EDD:   [DATE]
 Best:          28w 3d     Det. By:  U/S  ([DATE])          EDD:   [DATE]
Impression

 Patient was evaluated for c/o decreased fetal movements .
 Amniotic fluid is normal and good fetal activity is seen
 .Antenatal testing is reassuring. BPP [DATE].
 A free loop of umbilical cord is seen overlying the cervix (no
 vasa previa). If it persists, we will perform transvaginal
 ultrasound at her next visit.
                 DELGADO LOPES

## 2021-05-22 MED ORDER — INSULIN ASPART 100 UNIT/ML FLEXPEN: 5.0000 [IU] | PEN_INJECTOR | Freq: Three times a day (TID) | SUBCUTANEOUS | 11 refills | Status: DC

## 2021-05-22 MED ORDER — INSULIN NPH (HUMAN) (ISOPHANE) 100 UNIT/ML ~~LOC~~ SUSP: 12.0000 [IU] | Freq: Two times a day (BID) | SUBCUTANEOUS | 11 refills | Status: DC

## 2021-05-22 MED ORDER — LACTATED RINGERS IV BOLUS
1000.0000 mL | Freq: Once | INTRAVENOUS | Status: AC
  Administered 2021-05-22: 1000 mL via INTRAVENOUS

## 2021-05-22 MED ORDER — DIPHENHYDRAMINE HCL 50 MG/ML IJ SOLN
25.0000 mg | Freq: Once | INTRAMUSCULAR | Status: AC
  Administered 2021-05-22: 25 mg via INTRAVENOUS
  Filled 2021-05-22: qty 1

## 2021-05-22 MED ORDER — METOCLOPRAMIDE HCL 5 MG/ML IJ SOLN
10.0000 mg | Freq: Once | INTRAMUSCULAR | Status: AC
  Administered 2021-05-22: 10 mg via INTRAVENOUS
  Filled 2021-05-22: qty 2

## 2021-05-22 NOTE — MAU Note (Signed)
Patient blood sugar 56. Gave pt apple juice.

## 2021-05-22 NOTE — MAU Note (Signed)
...  Kristin Clay is a 35 y.o. at [redacted]w[redacted]d here in MAU reporting: No fetal movement since yesterday morning. She is also endorsing pain in her middle lower back that is intermittent. No VB or LOF.   FHT: 145 initial

## 2021-05-22 NOTE — MAU Provider Note (Addendum)
History     CSN: [id number]  Arrival date and time: 05/22/21 1434   None     Chief Complaint  Patient presents with   Decreased Fetal Movement   HPI  Patient is a 35 year old female G2P0 28 weeks 3 days gestation presenting to MAU for evaluation of decreased fetal movement, headache, low back pain, and emesis. Today she states that she has not felt fetal movement for two days. Per note by Dr. Dione Plover from patient's OB visit on 6/28, patient stated that she had not felt fetal movement for three days. Denies vaginal bleeding, vaginal discharge, and leaking of fluid. Also reports 2 day history of low back pain that she describes as "menstrual pain" and rates 5/10. Denies abdominal pain and contractions. Temporal headache started 5 days ago, currently 7/10. Headache is constant and can be described as throbbing. Reports associated symptom of blurred vision and has significant history of chronic hypertension well controlled with labetalol 133m QD. Denies photophobia and has not taken any medication for pain.  Reports nausea and vomiting for 2 months. Last episode of emesis today at 0930. Denies any food/fluid intake since last emesis episode. Takes Reglan for nausea at home and states it helps her symptoms, but also states she vomits everyday, especially after she eats high calorie foods. History of type 1 diabetes that is poorly controlled with insulin. Blood sugar was as low as 65 and as high as 265 yesterday. Taking insulin as prescribed. Reports low blood sugar after novolog insulin administration. Currently feels shaky and like her blood sugar is low.    OB History     Gravida  2   Para  0   Term  0   Preterm  0   AB  1   Living         SAB  1   IAB  0   Ectopic  0   Multiple      Live Births               Past Medical History:  Diagnosis Date   Complication of anesthesia    with hand surgery, local anes did not work, tried twice- had to put her to sleep    Diabetes [place] type 1 [place]    D[place] (diabetic [place]acidosis) [place] 10/2020   Infection    UTI   Pyelonephritis 10/2020   [place]    dx [place]    Past Surgical History:  Procedure Laterality Date   female circumcision Bilateral    clitorectomy as well   FOOT SURGERY  2018   FOOT SURGERY     HAND SURGERY  2019   HAND SURGERY      Family History  Problem Relation Age of Onset   Hyperlipidemia Mother    Hypertension Mother    Kidney disease Father    Alzheimer's disease Father      Social History   Tobacco Use   Smoking status: Never   Smokeless tobacco: Never  Vaping Use   Vaping Use: Never used  Substance Use Topics   Alcohol use: Never   Drug use: Never    Allergies:  Allergies  Allergen Reactions   Pork-Derived Products     Pt does not want any pork products    Facility-Administered Medications Prior to Admission  Medication Dose Route Frequency Provider Last Rate Last Admin   rho (d) immune globulin (RHIG/RHOPHYLAC) injection 300 mcg  300 mcg Intramuscular Once  Renee Harder, CNM       Medications Prior to Admission  Medication Sig Dispense Refill Last Dose   metoCLOPramide (REGLAN) 10 MG tablet Take 1 tablet (10 mg total) by mouth every 6 (six) hours. 20 tablet 0 05/22/2021 at 0730   blood glucose meter kit and supplies Dispense based on patient and insurance preference. Use up to four times daily as directed. (FOR ICD-10 E10.9, E11.9). 1 each 0    Cyanocobalamin (VITAMIN B 12 PO) Take by mouth. (Patient not taking: No sig reported)      folic acid (FOLVITE) 1 MG tablet Take 1 mg by mouth daily. (Patient not taking: No sig reported)      glucose blood test strip Use as instructed QID 100 each 12    insulin aspart (NOVOLOG) 100 UNIT/ML FlexPen Inject 9 Units into the skin 3 (three) times daily with meals. 15 mL 11    insulin NPH Human (NOVOLIN N) 100 UNIT/ML injection Inject 0.1 mLs (10 Units total) into the skin 2 (two)  times daily at 8 am and 10 pm. 10 mL 11    Insulin Pen Needle (ULTRA FLO INSULIN PEN NEEDLES) 32G X 4 MM MISC 1 Device by Does not apply route 3 (three) times daily. 100 each 4    Insulin Syringe-Needle U-100 30G X 15/64" 0.5 ML MISC 1 Device by Does not apply route 2 (two) times daily. 100 each 5    labetalol (NORMODYNE) 100 MG tablet Take 1 tablet (100 mg total) by mouth 2 (two) times daily. 60 tablet 2    metroNIDAZOLE (FLAGYL) 500 MG tablet Take 1 tablet (500 mg total) by mouth 2 (two) times daily. 14 tablet 0    ondansetron (ZOFRAN-ODT) 4 MG disintegrating tablet Take 1 tablet (4 mg total) by mouth every 8 (eight) hours as needed for nausea or vomiting. 8 tablet 0    OneTouch Delica Lancets 51Z MISC 1 Device by Does not apply route in the morning, at noon, in the evening, and at bedtime. 100 each 2    pantoprazole (PROTONIX) 40 MG tablet Take 1 tablet (40 mg total) by mouth daily. 30 tablet 2    Prenatal Vit-Fe Fumarate-FA (PRENATAL VITAMIN) 27-0.8 MG TABS Take 1 tablet by mouth daily. (Patient not taking: No sig reported) 30 tablet 3    terconazole (TERAZOL 7) 0.4 % vaginal cream Place 1 applicator vaginally at bedtime. 45 g 0     Review of Systems  Constitutional:  Negative for chills and fever.  Eyes:  Positive for visual disturbance. Negative for photophobia.       Reports blurry vision associated with headache   Respiratory:  Negative for chest tightness and shortness of breath.   Cardiovascular:  Negative for palpitations.  Gastrointestinal:  Positive for nausea and vomiting. Negative for abdominal pain and diarrhea.  Genitourinary:  Negative for pelvic pain, vaginal bleeding and vaginal discharge.  Musculoskeletal:  Positive for back pain.  Neurological:  Positive for light-headedness and headaches. Negative for weakness and numbness.  Physical Exam   Blood pressure 105/68, resp. rate 17, last menstrual period 10/22/2020, SpO2 100 %, unknown if currently breastfeeding.  Physical  Exam Constitutional:      General: She is not in acute distress.    Appearance: Normal appearance. She is not toxic-appearing.  Eyes:     General: No visual field deficit.    Pupils: Pupils are equal, round, and reactive to light.  Cardiovascular:     Rate and Rhythm: Normal  rate.  Pulmonary:     Effort: Pulmonary effort is normal.  Abdominal:     Palpations: Abdomen is soft.  Skin:    General: Skin is warm and dry.  Neurological:     General: No focal deficit present.     Mental Status: She is alert and oriented to person, place, and time. Mental status is at baseline.     Cranial Nerves: Cranial nerves are intact. No cranial nerve deficit, dysarthria or facial asymmetry.     Sensory: Sensation is intact. No sensory deficit.     Motor: Motor function is intact. No weakness or pronator drift.     Coordination: Coordination is intact. Coordination normal.  Psychiatric:        Behavior: Behavior is cooperative.    MAU Course  Procedures  Results for orders placed or performed during the hospital encounter of 05/22/21 (from the past 24 hour(s))  Glucose, capillary     Status: Abnormal   Collection Time: 05/22/21  4:18 PM  Result Value Ref Range   Glucose-Capillary 56 (L) 70 - 99 mg/dL  Comprehensive metabolic panel     Status: Abnormal   Collection Time: 05/22/21  4:28 PM  Result Value Ref Range   Sodium 136 135 - 145 mmol/L   Potassium 3.6 3.5 - 5.1 mmol/L   Chloride 107 98 - 111 mmol/L   CO2 22 22 - 32 mmol/L   Glucose, Bld 65 (L) 70 - 99 mg/dL   BUN <5 (L) 6 - 20 mg/dL   Creatinine, Ser 0.35 (L) 0.44 - 1.00 mg/dL   Calcium 8.3 (L) 8.9 - 10.3 mg/dL   Total Protein 5.9 (L) 6.5 - 8.1 g/dL   Albumin 2.6 (L) 3.5 - 5.0 g/dL   AST 12 (L) 15 - 41 U/L   ALT 8 0 - 44 U/L   Alkaline Phosphatase 58 38 - 126 U/L   Total Bilirubin 0.5 0.3 - 1.2 mg/dL   GFR, Estimated >60 >60 mL/min   Anion gap 7 5 - 15  CBC     Status: None   Collection Time: 05/22/21  4:28 PM  Result Value  Ref Range   WBC 4.4 4.0 - 10.5 K/uL   RBC 4.22 3.87 - 5.11 MIL/uL   Hemoglobin 12.2 12.0 - 15.0 g/dL   HCT 36.5 36.0 - 46.0 %   MCV 86.5 80.0 - 100.0 fL   MCH 28.9 26.0 - 34.0 pg   MCHC 33.4 30.0 - 36.0 g/dL   RDW 13.7 11.5 - 15.5 %   Platelets 269 150 - 400 K/uL   nRBC 0.0 0.0 - 0.2 %  Wet prep, genital     Status: Abnormal   Collection Time: 05/22/21  4:40 PM   Specimen: Vaginal  Result Value Ref Range   Yeast Wet Prep HPF POC NONE SEEN NONE SEEN   Trich, Wet Prep NONE SEEN NONE SEEN   Clue Cells Wet Prep HPF POC NONE SEEN NONE SEEN   WBC, Wet Prep HPF POC MODERATE (A) NONE SEEN   Sperm PRESENT   Glucose, capillary     Status: None   Collection Time: 05/22/21  4:44 PM  Result Value Ref Range   Glucose-Capillary 72 70 - 99 mg/dL  Protein / creatinine ratio, urine     Status: Abnormal   Collection Time: 05/22/21  5:10 PM  Result Value Ref Range   Creatinine, Urine 95.98 mg/dL   Total Protein, Urine 18 mg/dL   Protein Creatinine Ratio 0.19 (  H) 0.00 - 0.15 mg/mg[Cre]  Urinalysis, Routine w reflex microscopic     Status: Abnormal   Collection Time: 05/22/21  5:10 PM  Result Value Ref Range   Color, Urine YELLOW YELLOW   APPearance HAZY (A) CLEAR   Specific Gravity, Urine 1.020 1.005 - 1.030   pH 8.5 (H) 5.0 - 8.0   Glucose, UA NEGATIVE NEGATIVE mg/dL   Hgb urine dipstick NEGATIVE NEGATIVE   Bilirubin Urine NEGATIVE NEGATIVE   Ketones, ur NEGATIVE NEGATIVE mg/dL   Protein, ur NEGATIVE NEGATIVE mg/dL   Nitrite POSITIVE (A) NEGATIVE   Leukocytes,Ua NEGATIVE NEGATIVE  Urinalysis, Microscopic (reflex)     Status: Abnormal   Collection Time: 05/22/21  5:10 PM  Result Value Ref Range   RBC / HPF 0-5 0 - 5 RBC/hpf   WBC, UA 0-5 0 - 5 WBC/hpf   Bacteria, UA MANY (A) NONE SEEN   Squamous Epithelial / LPF 6-10 0 - 5   Mucus PRESENT    Sperm, UA PRESENT   Glucose, capillary     Status: Abnormal   Collection Time: 05/22/21  6:20 PM  Result Value Ref Range   Glucose-Capillary  55 (L) 70 - 99 mg/dL  Glucose, capillary     Status: None   Collection Time: 05/22/21  6:51 PM  Result Value Ref Range   Glucose-Capillary 86 70 - 99 mg/dL   MDM Decreased Fetal Movement:  - BPP 8/8 - Patient able to feel fetal movement near time of discharge   Headache: - Benadryl 73m IV, Reglan 113mIV, and 1 liter LR fluid bolus given (decadron held d/t poorly controlled type 1 diabetes mellitus) - patient reports headache improvement post cocktail administration  Blood sugar: - CBG 52 initially, given juice, re-check 15 minutes later CBG 72 - CBG in 50s again, given juice again and patient ordered dinner tray, CBG 70s 15 minutes later - Novolog insulin changed to 5 units TID instead of 9 units TID  Urinalysis: bacteruria, nitrites in urine - urine culture sent - patient denies urinary symptoms (pain with urination and urinary frequency) - will order antibiotics if culture results positive  Assessment and Plan     ICD-10-CM   1. Supervision of high risk pregnancy, antepartum  O09.90     2. Rh negative status during pregnancy in third trimester  O26.893    Z67.91     3. Decreased fetal movement  O36.8190 [place] FETAL BPP WO NON STRESS    [place] FETAL BPP WO NON STRESS    4. [redacted] weeks gestation of pregnancy  Z3A.28     5. Chronic hypertension affecting pregnancy  O10.919     6. Type 1 diabetes mellitus during pregnancy in third trimester  O24.013     7. NST (non-stress test) reactive  Z36.89     8. Acute non intractable tension-type headache  G44.209     9. Nausea/vomiting in pregnancy  O21.9      Plan Patient discharged home in stable condition Return precautions discussed  Follow-up with routine prenatal care Prescription for Zofran (patient already has- instructed to pick up from pharmacy)   CaSantina Evans/30/2022, 4:12 PM

## 2021-05-22 NOTE — MAU Provider Note (Addendum)
History     CSN: [id number]  Arrival date and time: 05/22/21 1434   Event Date/Time   First Provider Initiated Contact with Patient 05/22/21 1625      Chief Complaint  Patient presents with   Decreased Fetal Movement   35 y.o. G2P0010 _0 .3 wks presenting with decreased FM, HA, LBP, and N/V. Reports no FM for last 2 days. Reports 2 day hx of LBP that she describes as menstrual pain, rates 5/10. Denies VB, LOF, and ctx. Denies urinary sx. HA started 5 days ago, temporal and throbbing, rates 7/10. Associated sx are blurry vision. Hx of CHTN on Labetalol. Reports N/V for the last 2 mos which she uses Reglan for but has emesis daily. States the medicine helps. Hx of T1DM poorly controlled on Insulin. Of note, she was seen in the office 2 days ago and reported decreased FM x3 days, blood sugars were noted to be high 100s to low 200s. She was instructed to come to MAU that day.   OB History     Gravida  2   Para  0   Term  0   Preterm  0   AB  1   Living         SAB  1   IAB  0   Ectopic  0   Multiple      Live Births              Past Medical History:  Diagnosis Date   Complication of anesthesia    with hand surgery, local anes did not work, tried twice- had to put her to sleep   Diabetes mellitus type 1 ([place])    DKA (diabetic ketoacidosis) ([place]) 10/2020   Infection    UTI   Pyelonephritis 10/2020   Type I diabetes mellitus ([place])    dx 52yr ago    Past Surgical History:  Procedure Laterality Date   female circumcision Bilateral    clitorectomy as well   FOOT SURGERY  2018   FOOT SURGERY     HAND SURGERY  2019   HAND SURGERY      Family History  Problem Relation Age of Onset   Hyperlipidemia Mother    Hypertension Mother    Kidney disease Father    Alzheimer's disease Father     Social History   Tobacco Use   Smoking status: Never   Smokeless tobacco: Never  Vaping Use   Vaping Use: Never used  Substance Use Topics   Alcohol use:  Never   Drug use: Never    Allergies:  Allergies  Allergen Reactions   Pork-Derived Products     Pt does not want any pork products    Facility-Administered Medications Prior to Admission  Medication Dose Route Frequency Provider Last Rate Last Admin   rho (d) immune globulin (RHIG/RHOPHYLAC) injection 300 mcg  300 mcg Intramuscular Once SRenee Harder CNM       Medications Prior to Admission  Medication Sig Dispense Refill Last Dose   metoCLOPramide (REGLAN) 10 MG tablet Take 1 tablet (10 mg total) by mouth every 6 (six) hours. 20 tablet 0 05/22/2021 at 0730   blood glucose meter kit and supplies Dispense based on patient and insurance preference. Use up to four times daily as directed. (FOR ICD-10 E10.9, E11.9). 1 each 0    Cyanocobalamin (VITAMIN B 12 PO) Take by mouth. (Patient not taking: No sig reported)      folic acid (FOLVITE) 1 MG  tablet Take 1 mg by mouth daily. (Patient not taking: No sig reported)      glucose blood test strip Use as instructed QID 100 each 12    insulin aspart (NOVOLOG) 100 UNIT/ML FlexPen Inject 9 Units into the skin 3 (three) times daily with meals. 15 mL 11    insulin NPH Human (NOVOLIN N) 100 UNIT/ML injection Inject 0.1 mLs (10 Units total) into the skin 2 (two) times daily at 8 am and 10 pm. 10 mL 11    Insulin Pen Needle (ULTRA FLO INSULIN PEN NEEDLES) 32G X 4 MM MISC 1 Device by Does not apply route 3 (three) times daily. 100 each 4    Insulin Syringe-Needle U-100 30G X 15/64" 0.5 ML MISC 1 Device by Does not apply route 2 (two) times daily. 100 each 5    labetalol (NORMODYNE) 100 MG tablet Take 1 tablet (100 mg total) by mouth 2 (two) times daily. 60 tablet 2    metroNIDAZOLE (FLAGYL) 500 MG tablet Take 1 tablet (500 mg total) by mouth 2 (two) times daily. 14 tablet 0    ondansetron (ZOFRAN-ODT) 4 MG disintegrating tablet Take 1 tablet (4 mg total) by mouth every 8 (eight) hours as needed for nausea or vomiting. 8 tablet 0    OneTouch Delica  Lancets 35W MISC 1 Device by Does not apply route in the morning, at noon, in the evening, and at bedtime. 100 each 2    pantoprazole (PROTONIX) 40 MG tablet Take 1 tablet (40 mg total) by mouth daily. 30 tablet 2    Prenatal Vit-Fe Fumarate-FA (PRENATAL VITAMIN) 27-0.8 MG TABS Take 1 tablet by mouth daily. (Patient not taking: No sig reported) 30 tablet 3    terconazole (TERAZOL 7) 0.4 % vaginal cream Place 1 applicator vaginally at bedtime. 45 g 0     Review of Systems  Eyes:  Positive for visual disturbance. Negative for photophobia.  Respiratory:  Negative for shortness of breath.   Cardiovascular:  Negative for chest pain.  Gastrointestinal:  Positive for nausea and vomiting. Negative for abdominal pain.  Genitourinary:  Negative for dysuria, frequency, hematuria, urgency, vaginal bleeding and vaginal discharge.  Musculoskeletal:  Positive for back pain.  Neurological:  Positive for light-headedness and headaches.  Physical Exam   Blood pressure 105/68, resp. rate 17, last menstrual period 10/22/2020, SpO2 100 %, unknown if currently breastfeeding. Patient Vitals for the past 24 hrs:  BP Temp src Pulse Resp SpO2  05/22/21 1757 127/81 -- 74 -- --  05/22/21 1532 105/68 -- -- -- 100 %  05/22/21 1525 123/71 Oral -- 17 100 %    Physical Exam Vitals and nursing note reviewed. Exam conducted with a chaperone present.  Constitutional:      General: She is not in acute distress.    Appearance: Normal appearance.  HENT:     Head: Normocephalic and atraumatic.  Cardiovascular:     Rate and Rhythm: Normal rate.  Pulmonary:     Effort: Pulmonary effort is normal. No respiratory distress.  Abdominal:     Palpations: Abdomen is soft.     Tenderness: There is no abdominal tenderness.  Genitourinary:    Comments: VE: closed/thick Musculoskeletal:        General: Normal range of motion.     Cervical back: Normal range of motion.  Skin:    General: Skin is warm and dry.  Neurological:      General: No focal deficit present.     Mental Status:  She is alert and oriented to person, place, and time.  Psychiatric:        Mood and Affect: Mood normal.        Behavior: Behavior normal.   EFM: 135 bpm, mod variability, + accels, no decels Toco: UI  Results for orders placed or performed during the hospital encounter of 05/22/21 (from the past 24 hour(s))  Glucose, capillary     Status: Abnormal   Collection Time: 05/22/21  4:18 PM  Result Value Ref Range   Glucose-Capillary 56 (L) 70 - 99 mg/dL  Comprehensive metabolic panel     Status: Abnormal   Collection Time: 05/22/21  4:28 PM  Result Value Ref Range   Sodium 136 135 - 145 mmol/L   Potassium 3.6 3.5 - 5.1 mmol/L   Chloride 107 98 - 111 mmol/L   CO2 22 22 - 32 mmol/L   Glucose, Bld 65 (L) 70 - 99 mg/dL   BUN <5 (L) 6 - 20 mg/dL   Creatinine, Ser 0.35 (L) 0.44 - 1.00 mg/dL   Calcium 8.3 (L) 8.9 - 10.3 mg/dL   Total Protein 5.9 (L) 6.5 - 8.1 g/dL   Albumin 2.6 (L) 3.5 - 5.0 g/dL   AST 12 (L) 15 - 41 U/L   ALT 8 0 - 44 U/L   Alkaline Phosphatase 58 38 - 126 U/L   Total Bilirubin 0.5 0.3 - 1.2 mg/dL   GFR, Estimated >60 >60 mL/min   Anion gap 7 5 - 15  CBC     Status: None   Collection Time: 05/22/21  4:28 PM  Result Value Ref Range   WBC 4.4 4.0 - 10.5 K/uL   RBC 4.22 3.87 - 5.11 MIL/uL   Hemoglobin 12.2 12.0 - 15.0 g/dL   HCT 36.5 36.0 - 46.0 %   MCV 86.5 80.0 - 100.0 fL   MCH 28.9 26.0 - 34.0 pg   MCHC 33.4 30.0 - 36.0 g/dL   RDW 13.7 11.5 - 15.5 %   Platelets 269 150 - 400 K/uL   nRBC 0.0 0.0 - 0.2 %  Wet prep, genital     Status: Abnormal   Collection Time: 05/22/21  4:40 PM   Specimen: Vaginal  Result Value Ref Range   Yeast Wet Prep HPF POC NONE SEEN NONE SEEN   Trich, Wet Prep NONE SEEN NONE SEEN   Clue Cells Wet Prep HPF POC NONE SEEN NONE SEEN   WBC, Wet Prep HPF POC MODERATE (A) NONE SEEN   Sperm PRESENT   Glucose, capillary     Status: None   Collection Time: 05/22/21  4:44 PM  Result  Value Ref Range   Glucose-Capillary 72 70 - 99 mg/dL  Protein / creatinine ratio, urine     Status: Abnormal   Collection Time: 05/22/21  5:10 PM  Result Value Ref Range   Creatinine, Urine 95.98 mg/dL   Total Protein, Urine 18 mg/dL   Protein Creatinine Ratio 0.19 (H) 0.00 - 0.15 mg/mg[Cre]  Urinalysis, Routine w reflex microscopic     Status: Abnormal   Collection Time: 05/22/21  5:10 PM  Result Value Ref Range   Color, Urine YELLOW YELLOW   APPearance HAZY (A) CLEAR   Specific Gravity, Urine 1.020 1.005 - 1.030   pH 8.5 (H) 5.0 - 8.0   Glucose, UA NEGATIVE NEGATIVE mg/dL   Hgb urine dipstick NEGATIVE NEGATIVE   Bilirubin Urine NEGATIVE NEGATIVE   Ketones, ur NEGATIVE NEGATIVE mg/dL   Protein, ur NEGATIVE  NEGATIVE mg/dL   Nitrite POSITIVE (A) NEGATIVE   Leukocytes,Ua NEGATIVE NEGATIVE  Urinalysis, Microscopic (reflex)     Status: Abnormal   Collection Time: 05/22/21  5:10 PM  Result Value Ref Range   RBC / HPF 0-5 0 - 5 RBC/hpf   WBC, UA 0-5 0 - 5 WBC/hpf   Bacteria, UA MANY (A) NONE SEEN   Squamous Epithelial / LPF 6-10 0 - 5   Mucus PRESENT    Sperm, UA PRESENT   Glucose, capillary     Status: Abnormal   Collection Time: 05/22/21  6:20 PM  Result Value Ref Range   Glucose-Capillary 55 (L) 70 - 99 mg/dL  Glucose, capillary     Status: None   Collection Time: 05/22/21  6:51 PM  Result Value Ref Range   Glucose-Capillary 86 70 - 99 mg/dL   [place] MFM FETAL BPP WO NON STRESS  Result Date: 05/22/2021 ----------------------------------------------------------------------  OBSTETRICS REPORT                       (Signed Final 05/22/2021 06:03 pm) ---------------------------------------------------------------------- Patient Info  ID #:       [id number]                          D.O.B.:  Nov 16, 1986 (34 yrs)  Name:       Kristin Clay                Visit Date: 05/22/2021 05:16 pm              [place] ---------------------------------------------------------------------- Performed By   Attending:        Tama High MD        Secondary Phy.:   Graciela Husbands CNM  Performed By:     Georgie Chard        Address:          [address]                    [place], [place]  [address]  Referred By:      [facility] MAU/Triage         [facility]                                                             [place] ---------------------------------------------------------------------- Orders  #  Description                           Code        Ordered By  1  [place] MFM FETAL BPP WO NON               76819.01    Damilola Flamm     STRESS ----------------------------------------------------------------------  #  Order #                     Accession #                Episode #  1  [id number]                   [id number]                 [id number] ---------------------------------------------------------------------- Indications  [redacted] weeks gestation of pregnancy                Z3A.28  Decreased fetal movements, third trimester,    O36.8130  unspecified ---------------------------------------------------------------------- Fetal Evaluation  Num Of Fetuses:         1  Fetal Heart Rate(bpm):  135  Cardiac Activity:       Observed  Presentation:           Breech  Placenta:               Anterior  P. Cord Insertion:      Previously Visualized  Amniotic Fluid  AFI FV:      Within normal limits  AFI Sum(cm)     %Tile       Largest Pocket(cm)  16.9            63          7.  RUQ(cm)       RLQ(cm)       LUQ(cm)        LLQ(cm)  7             5.9           2.7            1.3 ---------------------------------------------------------------------- Biophysical Evaluation  Amniotic F.V:   Pocket => 2 cm             F. Tone:        Observed  F. Movement:     Observed                   Score:          8/8  F. Breathing:   Observed ---------------------------------------------------------------------- Biometry  LV:        4.1  mm ---------------------------------------------------------------------- OB History  Gravidity:    2         Term:   0        Prem:  0        SAB:   1  TOP:          0       Ectopic:  0        Living: 0 ---------------------------------------------------------------------- Gestational Age  LMP:           30w 2d        Date:  10/22/20                 EDD:   07/29/21  Best:          Timothy Lasso 3d     Det. By:  U/S  (04/11/21)          EDD:   08/11/21 ---------------------------------------------------------------------- Impression  Patient was evaluated for c/o decreased fetal movements .  Amniotic fluid is normal and good fetal activity is seen  .Antenatal testing is reassuring. BPP 8/8.  A free loop of umbilical cord is seen overlying the cervix (no  vasa previa). If it persists, we will perform transvaginal  ultrasound at her next visit. ----------------------------------------------------------------------                  Tama High, MD Electronically Signed Final Report   05/22/2021 06:03 pm ----------------------------------------------------------------------   MAU Course  Procedures LR Reglan Benadryl  MDM Labs and [place] ordered and reviewed. Hypoglycemia x2 episodes while in MAU pt symptomatic, treated with juice and BS improved. Consult with Dr. Dione Plover, will decrease mealtime Insulin to 5 units, no change to NPH. HA improved with cocktail. Back pain improved. No signs of PTL. No signs of PEC. Recommend Zofran to better control her N/V which in turn should improve labile BS. BPP 8/8, NST reactive, feeling FM near time of discharge. UA with bacteria and nitrites, asymptomatic, UC ordered. Stable for discharge.  Assessment and Plan   1. Supervision of high risk pregnancy, antepartum   2. Rh negative status during pregnancy in third  trimester   3. Decreased fetal movement   4. [redacted] weeks gestation of pregnancy   5. Chronic hypertension affecting pregnancy   6. Type 1 diabetes mellitus during pregnancy in third trimester   7. NST (non-stress test) reactive   8. Acute non intractable tension-type headache   9. Nausea/vomiting in pregnancy   10. 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, second trimester   11. Nausea and vomiting, intractability of vomiting not specified, unspecified vomiting type    Discharge home Follow up at [facility] as scheduled Return precautions Rx Zofran (already has) PTL precautions FMCs  Allergies as of 05/22/2021       Reactions   Pork-derived Products    Pt does not want any pork products        Medication List     STOP taking these medications    folic acid 1 MG tablet Commonly known as: FOLVITE   metroNIDAZOLE 500 MG tablet Commonly known as: FLAGYL   Prenatal Vitamin 27-0.8 MG Tabs   terconazole 0.4 % vaginal cream Commonly known as: TERAZOL 7   VITAMIN B 12 PO       TAKE these medications    blood glucose meter kit and supplies Dispense based on patient and insurance preference. Use up to four times daily as directed. (FOR ICD-10 E10.9, E11.9).   glucose blood test strip Use as instructed QID   insulin aspart 100 UNIT/ML FlexPen Commonly known as: NOVOLOG Inject 5 Units into the skin 3 (three) times daily with meals. What changed: how much  to take   insulin NPH Human 100 UNIT/ML injection Commonly known as: NOVOLIN N Inject 0.12 mLs (12 Units total) into the skin 2 (two) times daily at 8 am and 10 pm. What changed: how much to take   Insulin Syringe-Needle U-100 30G X 15/64" 0.5 ML Misc 1 Device by Does not apply route 2 (two) times daily.   labetalol 100 MG tablet Commonly known as: NORMODYNE Take 1 tablet (100 mg total) by mouth 2 (two) times daily.   metoCLOPramide 10 MG tablet Commonly known as: REGLAN Take 1 tablet (10 mg total) by  mouth every 6 (six) hours.   ondansetron 4 MG disintegrating tablet Commonly known as: ZOFRAN-ODT Take 1 tablet (4 mg total) by mouth every 8 (eight) hours as needed for nausea or vomiting.   OneTouch Delica Lancets 73U Misc 1 Device by Does not apply route in the morning, at noon, in the evening, and at bedtime.   pantoprazole 40 MG tablet Commonly known as: Protonix Take 1 tablet (40 mg total) by mouth daily.   Ultra Flo Insulin Pen Needles 32G X 4 MM Misc Generic drug: Insulin Pen Needle 1 Device by Does not apply route 3 (three) times daily.       Arabic interpreter used for all interactions  Julianne Handler 05/22/2021, 5:02 PM

## 2021-05-23 LAB — GC/CHLAMYDIA PROBE AMP (~~LOC~~) NOT AT ARMC
Chlamydia: NEGATIVE
Comment: NEGATIVE
Comment: NORMAL
Neisseria Gonorrhea: NEGATIVE

## 2021-05-25 ENCOUNTER — Telehealth: Payer: Self-pay | Admitting: Certified Nurse Midwife

## 2021-05-25 DIAGNOSIS — O2343 Unspecified infection of urinary tract in pregnancy, third trimester: Secondary | ICD-10-CM

## 2021-05-25 DIAGNOSIS — O2340 Unspecified infection of urinary tract in pregnancy, unspecified trimester: Secondary | ICD-10-CM | POA: Insufficient documentation

## 2021-05-25 LAB — CULTURE, OB URINE: Culture: >=100000 — AB

## 2021-05-25 MED ORDER — NITROFURANTOIN MONOHYD MACRO 100 MG PO CAPS: 100.0000 mg | ORAL_CAPSULE | Freq: Two times a day (BID) | ORAL | 0 refills | Status: DC

## 2021-05-25 NOTE — Telephone Encounter (Signed)
Attempted to call pt regarding +UTI, no answer, message left to all back. [place] interpreter used.

## 2021-05-25 NOTE — Telephone Encounter (Signed)
Pt notified of UTI, Rx sent to pharmacy. Verbalizes understanding.

## 2021-05-28 DIAGNOSIS — O24913 Unspecified diabetes mellitus in pregnancy, third trimester: Secondary | ICD-10-CM | POA: Diagnosis not present

## 2021-05-29 ENCOUNTER — Encounter: Payer: Medicaid Other | Attending: Obstetrics and Gynecology | Admitting: Registered"

## 2021-05-29 ENCOUNTER — Other Ambulatory Visit: Payer: Self-pay

## 2021-05-29 ENCOUNTER — Ambulatory Visit: Payer: Self-pay | Admitting: Registered"

## 2021-05-29 DIAGNOSIS — O24019 Pre-existing diabetes mellitus, type 1, in pregnancy, unspecified trimester: Secondary | ICD-10-CM | POA: Insufficient documentation

## 2021-05-29 DIAGNOSIS — O24012 Pre-existing diabetes mellitus, type 1, in pregnancy, second trimester: Secondary | ICD-10-CM

## 2021-05-30 NOTE — Progress Notes (Signed)
Patient was seen for T1DM in pregnancy self-management on 05/29/2021  Start time 1315 and End time 1415   Estimated due date: 08/11/21; [redacted]w[redacted]d  Clinical: Medications: Novolin 12 u am/pm; Novolog pens taking 5 units with meals Medical History: DKA and pyelonephritis, severe N/V, miscarriage last pregnancy 01/30/20 Labs: OGTT n/a, A1c 8.4% on 6/9 and 11.9% 10/25/20  Dietary and Lifestyle History: Pt states she has applied for Medicaid but has not heard back yet. Renea Ee provided Patient with the case manager's card and asked patient to contact her to find out the status of the application.   Pt states she avoids going to doctor's appointments because she does not want to add to her debt, $16,000 bill from the hospital. Patient also stated that she paid $300 for insulin and wanted a different option. Patient has 5 Novolog pens and 1 vial Novolin left.  Patient reports that her vision has worsened.   Hyper/Hypoglycemia Pt states once recently she got a "HI" reading on glucometer and her MD in [place] told her to double meal insulin to treat so she took 10 units of insulin and 2 hrs late was shaky and BG was 93 mg/dL. Ate 1/2 pita with eggs and felt better within an hour.Pt reports one reading of 54 last week. Pt state she now only takes Novolog if she is eating.  24 hr Recall: Patient reports mostly relying on carbohydrates for her diet and during pregnancy she has had protein aversions. Only protein she tolerated is eggs.   NUTRITION INTERVENTION  Nutrition education (E-1) on the following topics:   Initial Follow-up  [x]  []  Case Manager role in financial concerns/ Medicaid  follow-up [x]  []  Macro nutrient categories, sources of protein  [x]  []  Importance of Eye exams  Patient already has a meter, is sometimes testing pre breakfast and 2 hours after each meal. Did not bring log FBS: 93 Postprandial: Breakfast: 110-115; Lunch 175-265; Dinner 119  Patient states she is very hungry at lunch and  eats more fruit at this time.  Patient received handouts: none  Patient will be seen for follow-up asin 2 weeks.

## 2021-06-02 ENCOUNTER — Telehealth: Payer: Self-pay

## 2021-06-02 NOTE — Telephone Encounter (Signed)
FETAL ECHO SCHEDULED FOR 05/28/2021.

## 2021-06-05 ENCOUNTER — Encounter: Payer: Self-pay | Admitting: Obstetrics and Gynecology

## 2021-06-05 ENCOUNTER — Other Ambulatory Visit (HOSPITAL_COMMUNITY)
Admission: RE | Admit: 2021-06-05 | Discharge: 2021-06-05 | Disposition: A | Discharge status: Home / Self Care | Payer: Medicaid Other | Source: Ambulatory Visit | Attending: Obstetrics and Gynecology | Admitting: Obstetrics and Gynecology

## 2021-06-05 ENCOUNTER — Other Ambulatory Visit: Payer: Self-pay

## 2021-06-05 ENCOUNTER — Ambulatory Visit (INDEPENDENT_AMBULATORY_CARE_PROVIDER_SITE_OTHER): Payer: Self-pay | Admitting: Obstetrics and Gynecology

## 2021-06-05 VITALS — BP 150/96 | HR 77 | Wt 121.8 lb

## 2021-06-05 DIAGNOSIS — O24013 Pre-existing diabetes mellitus, type 1, in pregnancy, third trimester: Secondary | ICD-10-CM

## 2021-06-05 DIAGNOSIS — E1010 Type 1 diabetes mellitus with ketoacidosis without coma: Secondary | ICD-10-CM

## 2021-06-05 DIAGNOSIS — O0990 Supervision of high risk pregnancy, unspecified, unspecified trimester: Secondary | ICD-10-CM

## 2021-06-05 DIAGNOSIS — Z3A30 30 weeks gestation of pregnancy: Secondary | ICD-10-CM

## 2021-06-05 DIAGNOSIS — O368130 Decreased fetal movements, third trimester, not applicable or unspecified: Secondary | ICD-10-CM | POA: Insufficient documentation

## 2021-06-05 DIAGNOSIS — O4700 False labor before 37 completed weeks of gestation, unspecified trimester: Secondary | ICD-10-CM | POA: Insufficient documentation

## 2021-06-05 DIAGNOSIS — G8929 Other chronic pain: Secondary | ICD-10-CM

## 2021-06-05 DIAGNOSIS — M25512 Pain in left shoulder: Secondary | ICD-10-CM

## 2021-06-05 DIAGNOSIS — O0932 Supervision of pregnancy with insufficient antenatal care, second trimester: Secondary | ICD-10-CM

## 2021-06-05 DIAGNOSIS — O26893 Other specified pregnancy related conditions, third trimester: Secondary | ICD-10-CM

## 2021-06-05 DIAGNOSIS — O10919 Unspecified pre-existing hypertension complicating pregnancy, unspecified trimester: Secondary | ICD-10-CM

## 2021-06-05 DIAGNOSIS — Z6791 Unspecified blood type, Rh negative: Secondary | ICD-10-CM

## 2021-06-05 DIAGNOSIS — M25511 Pain in right shoulder: Secondary | ICD-10-CM

## 2021-06-05 LAB — OB RESULTS CONSOLE GBS: GBS: POSITIVE

## 2021-06-05 MED ORDER — LABETALOL HCL 100 MG PO TABS: 200.0000 mg | ORAL_TABLET | Freq: Two times a day (BID) | ORAL | 2 refills | Status: DC

## 2021-06-05 MED ORDER — CYCLOBENZAPRINE HCL 5 MG PO TABS: 5.0000 mg | ORAL_TABLET | Freq: Every evening | ORAL | 0 refills | Status: DC | PRN

## 2021-06-05 MED ORDER — PRENATAL VITAMIN 27-0.8 MG PO TABS: 1.0000 | ORAL_TABLET | Freq: Every day | ORAL | 2 refills | Status: DC

## 2021-06-05 NOTE — Progress Notes (Signed)
PRENATAL VISIT NOTE  Subjective:  Kristin Clay is a 35 y.o. G2P0010 at [redacted]w[redacted]d being seen today for ongoing prenatal care.  She is currently monitored for the following issues for this high-risk pregnancy and has DKA (diabetic ketoacidoses); Nausea & vomiting; Acute lower UTI; Syncope; Female circumcision; Language barrier; Type 1 diabetes mellitus with ketoacidosis, uncontrolled (HCC); DKA, type 1 (HCC); Acute pyelonephritis; Supervision of high risk pregnancy, antepartum; Rh negative status during pregnancy; [redacted] weeks gestation of pregnancy; 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, second trimester; History of diabetic ketoacidosis; Late prenatal care affecting pregnancy in second trimester; Chronic hypertension affecting pregnancy; UTI (urinary tract infection) during pregnancy; and Decreased fetal movements in third trimester on their problem list.  Patient reports  concern of decreased fetal movement and intermittent tightening of abdomen .  Contractions: Not present. Vag. Bleeding: None.  Movement: (!) Decreased. Denies leaking of fluid. Reports good adherence to insulin but blood sugars continue to swing (multiple highs in addition to symptomatic lows).  The following portions of the patient's history were reviewed and updated as appropriate: allergies, current medications, past family history, past medical history, past social history, past surgical history and problem list.   Objective:   Vitals:   06/05/21 0911  BP: (!) 150/96  Pulse: 77  Weight: 121 lb 12.8 oz (55.2 kg)    Fetal Status: Fetal Heart Rate (bpm): 141   Movement: (!) Decreased     General:  Alert, oriented and cooperative. Patient is in no acute distress.  Skin: Skin is warm and dry. No rash noted.   Cardiovascular: Normal heart rate noted  Respiratory: Normal respiratory effort, no problems with respiration noted  Abdomen: Soft, gravid, appropriate for gestational age.  Pain/Pressure:  Present     Pelvic: Cervical exam performed in the presence of a chaperone. Closed/thick/high.        Extremities: Normal range of motion.  Edema: Trace  Mental Status: Normal mood and affect. Normal behavior. Normal judgment and thought content.   Non-stress test: baseline 135/moderate variability/multiple accels/no decels  Assessment and Plan:  Pregnancy: G2P0010 at [redacted]w[redacted]d 1. Supervision of high risk pregnancy, antepartum, [redacted] weeks gestational age: Pt reports decrease fetal movement in setting of uncontrolled Type 1 diabetes despite good adherence to BG checks and insulin regimen. Mild range blood pressure today. Normal FHTs and reactive NST. Appropriate fundal height. EFW 19% at [redacted]w[redacted]d. Pt very nervous of whether she will need to have a Cesarean delivery--discussed plan for vaginal delivery unless concern for fetal distress or other maternal concerns. - instructed pt to present to HD for Tdap given no current insurance - RTC in 2 weeks for f/u prenatal appt or sooner as needed for return precautions - plan for delivery by 37w or sooner per MFM  Chronic hypertension affecting pregnancy: BP 150/96 today in clinic, but improved to 141/91 on recheck. Pt reports checking blood pressure regularly at home with measurements typically in 150s systolic.  - increased labetalol to 200mg  BID - continue growth ultrasounds as noted below - strict return precautions for worsening BPs and signs/symptoms of preeclampsia  2. Type 1 diabetes mellitus with ketoacidosis, uncontrolled (HCC): Uncontrolled with notable swings from extreme highs to symptomatic lows. Pt checking BG consistently QID. Fasting BG levels range from 86 to 184; however, pt reports that when her fasting BG levels have NOT been at goal she has had a early morning snack so not a true fasting measure. Postprandial levels range from 64 to 282; often  associated with intake of fruit. Pt reports good adherence to insulin (taking NPH 12u in AM & PM with  mealtime Novolog 5u TID). S/p normal fetal echo at [facility] on 05/28/21. - continue weekly BPP with q3 week growth ultrasounds (next appt 7/18) - given concern of multiple symptomatic low BG levels, will defer increase in insulin at this time. Discussed at length importance of diet changes to eliminate sugary drinks and other foods with high glycemic index - continue NPH 12u in AM & PM, Novolog 5u TID AC (again counseled to NOT take mealtime insulin if not eating) - plan for close f/u with diabetes educator for further pt counseling - MFM follow-up on 7/18  3. Female Circumcision, Type 3: Discussed options for repair s/p vaginal delivery. - pt desires repair s/p vaginal delivery  4. Rh negative status during pregnancy in third trimester - s/p rhogam on 6/21  5. Late prenatal care affecting pregnancy in second trimester: Pt received initial prenatal care in [place].  6. Decreased Fetal Movement: Pt reports decreased fetal movement today. Reassuringly, NST reactive in clinic today. - provided written instruction for kick counts and discussed with pt - strict return precautions to present to MAU or call clinic if recurrent concerns  7. Bilateral shoulder pain: Pt reports pain as chronic. Not associated with abdominal pain. Pt reports using voltaren gel prn, previously prescribed in [place]. - discussed importance of discontinuing voltaren gel - instructed use of tylenol prn for pain - prescribed short course of flexeril 5mg  qHS prn   8. Language Barrier: in person Arabic interpreter for visit today  Preterm labor symptoms and general obstetric precautions including but not limited to vaginal bleeding, contractions, leaking of fluid and fetal movement were reviewed in detail with the patient. Please refer to After Visit Summary for other counseling recommendations.   Return in about 2 weeks (around 06/19/2021).  Future Appointments  Date Time Provider Department Center  06/09/2021  9:15 AM [facility]  NURSE [facility]  06/09/2021  9:30 AM [facility] US3 [facility]  06/12/2021  9:15 AM [facility] NST [facility]  06/12/2021 10:15 AM [facility]  06/17/2021 10:15 AM [facility] NST [facility]  06/19/2021  9:35 AM 06/21/2021, MD [facility]  06/19/2021 10:15 AM [facility]    06/21/2021, MD

## 2021-06-05 NOTE — Progress Notes (Signed)
Patient complains of shoulder pain. Patient wants to know if she will have a vaginal delivery or c-section

## 2021-06-06 ENCOUNTER — Other Ambulatory Visit: Payer: Self-pay

## 2021-06-06 LAB — PROTEIN / CREATININE RATIO, URINE
Creatinine, Urine: 84.5 mg/dL
Protein, Ur: 31.6 mg/dL
Protein/Creat Ratio: 374 mg/g creat — ABNORMAL HIGH (ref 0–200)

## 2021-06-06 LAB — COMPREHENSIVE METABOLIC PANEL
ALT: 5 IU/L (ref 0–32)
AST: 6 IU/L (ref 0–40)
Albumin/Globulin Ratio: 1.3 (ref 1.2–2.2)
Albumin: 3.3 g/dL — ABNORMAL LOW (ref 3.8–4.8)
Alkaline Phosphatase: 87 IU/L (ref 44–121)
BUN/Creatinine Ratio: 12 (ref 9–23)
BUN: 5 mg/dL — ABNORMAL LOW (ref 6–20)
Bilirubin Total: 0.3 mg/dL (ref 0.0–1.2)
CO2: 16 mmol/L — ABNORMAL LOW (ref 20–29)
Calcium: 8 mg/dL — ABNORMAL LOW (ref 8.7–10.2)
Chloride: 103 mmol/L (ref 96–106)
Creatinine, Ser: 0.42 mg/dL — ABNORMAL LOW (ref 0.57–1.00)
Globulin, Total: 2.6 g/dL (ref 1.5–4.5)
Glucose: 210 mg/dL — ABNORMAL HIGH (ref 65–99)
Potassium: 3.5 mmol/L (ref 3.5–5.2)
Sodium: 138 mmol/L (ref 134–144)
Total Protein: 5.9 g/dL — ABNORMAL LOW (ref 6.0–8.5)
eGFR: 131 mL/min/{1.73_m2} (ref >59)

## 2021-06-06 LAB — CBC
Hematocrit: 37.7 % (ref 34.0–46.6)
Hemoglobin: 12.4 g/dL (ref 11.1–15.9)
MCH: 29 pg (ref 26.6–33.0)
MCHC: 32.9 g/dL (ref 31.5–35.7)
MCV: 88 fL (ref 79–97)
Platelets: 203 10*3/uL (ref 150–450)
RBC: 4.28 x10E6/uL (ref 3.77–5.28)
RDW: 14.1 % (ref 11.7–15.4)
WBC: 3 10*3/uL — ABNORMAL LOW (ref 3.4–10.8)

## 2021-06-06 LAB — HIV ANTIBODY (ROUTINE TESTING W REFLEX): HIV Screen 4th Generation wRfx: NONREACTIVE

## 2021-06-06 LAB — CERVICOVAGINAL ANCILLARY ONLY
Bacterial Vaginitis (gardnerella): POSITIVE — AB
Candida Glabrata: NEGATIVE
Candida Vaginitis: POSITIVE — AB
Comment: NEGATIVE
Comment: NEGATIVE
Comment: NEGATIVE

## 2021-06-06 LAB — RPR: RPR Ser Ql: NONREACTIVE

## 2021-06-08 DIAGNOSIS — Z3A30 30 weeks gestation of pregnancy: Secondary | ICD-10-CM | POA: Insufficient documentation

## 2021-06-09 ENCOUNTER — Encounter: Payer: Self-pay | Admitting: Obstetrics and Gynecology

## 2021-06-09 ENCOUNTER — Telehealth: Payer: Self-pay | Admitting: *Deleted

## 2021-06-09 ENCOUNTER — Other Ambulatory Visit: Payer: Self-pay | Admitting: Obstetrics and Gynecology

## 2021-06-09 ENCOUNTER — Ambulatory Visit: Payer: Medicaid Other | Admitting: *Deleted

## 2021-06-09 ENCOUNTER — Encounter: Payer: Self-pay | Admitting: *Deleted

## 2021-06-09 ENCOUNTER — Other Ambulatory Visit: Payer: Self-pay

## 2021-06-09 ENCOUNTER — Telehealth: Payer: Self-pay | Admitting: Obstetrics and Gynecology

## 2021-06-09 ENCOUNTER — Ambulatory Visit: Payer: Medicaid Other | Attending: Obstetrics and Gynecology

## 2021-06-09 VITALS — BP 137/89 | HR 85

## 2021-06-09 DIAGNOSIS — B3731 Acute candidiasis of vulva and vagina: Secondary | ICD-10-CM

## 2021-06-09 DIAGNOSIS — R8271 Bacteriuria: Secondary | ICD-10-CM

## 2021-06-09 DIAGNOSIS — O24012 Pre-existing diabetes mellitus, type 1, in pregnancy, second trimester: Secondary | ICD-10-CM | POA: Insufficient documentation

## 2021-06-09 DIAGNOSIS — O0990 Supervision of high risk pregnancy, unspecified, unspecified trimester: Secondary | ICD-10-CM | POA: Diagnosis present

## 2021-06-09 DIAGNOSIS — B373 Candidiasis of vulva and vagina: Secondary | ICD-10-CM

## 2021-06-09 DIAGNOSIS — O10913 Unspecified pre-existing hypertension complicating pregnancy, third trimester: Secondary | ICD-10-CM

## 2021-06-09 DIAGNOSIS — E109 Type 1 diabetes mellitus without complications: Secondary | ICD-10-CM

## 2021-06-09 DIAGNOSIS — Z3A31 31 weeks gestation of pregnancy: Secondary | ICD-10-CM

## 2021-06-09 DIAGNOSIS — O113 Pre-existing hypertension with pre-eclampsia, third trimester: Secondary | ICD-10-CM

## 2021-06-09 DIAGNOSIS — O0933 Supervision of pregnancy with insufficient antenatal care, third trimester: Secondary | ICD-10-CM

## 2021-06-09 DIAGNOSIS — B9689 Other specified bacterial agents as the cause of diseases classified elsewhere: Secondary | ICD-10-CM

## 2021-06-09 DIAGNOSIS — O24013 Pre-existing diabetes mellitus, type 1, in pregnancy, third trimester: Secondary | ICD-10-CM

## 2021-06-09 DIAGNOSIS — O365930 Maternal care for other known or suspected poor fetal growth, third trimester, not applicable or unspecified: Secondary | ICD-10-CM

## 2021-06-09 DIAGNOSIS — N760 Acute vaginitis: Secondary | ICD-10-CM

## 2021-06-09 HISTORY — DX: Bacteriuria: R82.71

## 2021-06-09 LAB — CULTURE, OB URINE

## 2021-06-09 LAB — URINE CULTURE, OB REFLEX

## 2021-06-09 IMAGING — US US MFM OB FOLLOW-UP
1 series · 13 of 28 positions shown · non-contrast
Comparison: none

[Series 1: us mfm ob follow-up · 57 acquisitions, 13 frames shown]
[im 3/57]
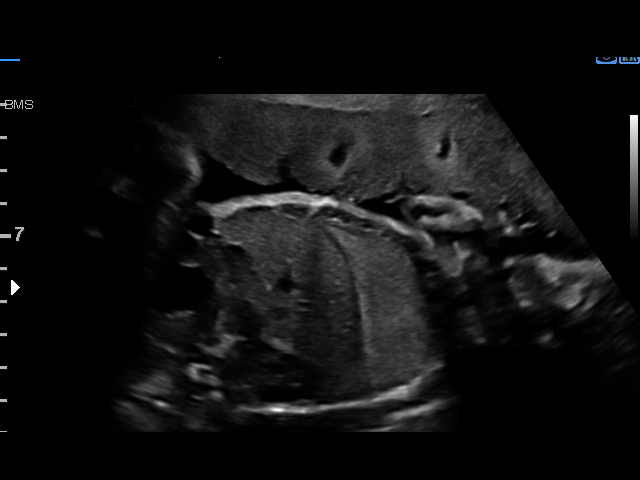
[im 7/57]
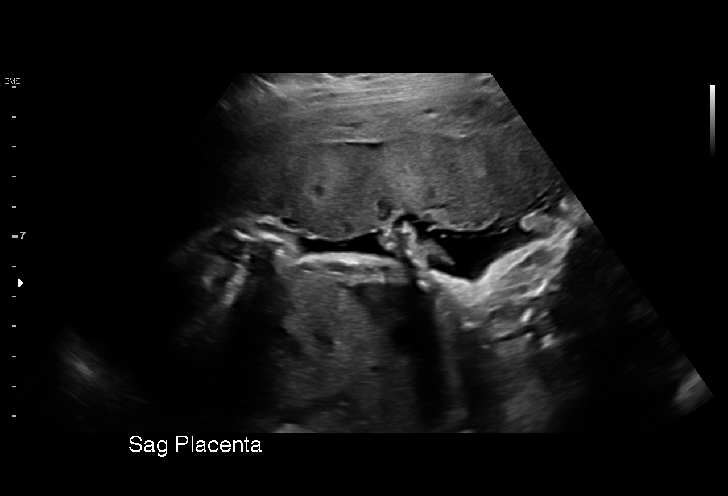
[im 11/57]
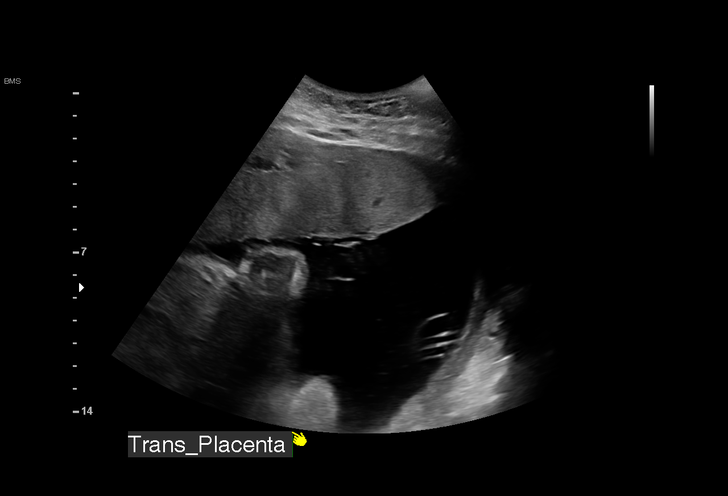
[im 15/57]
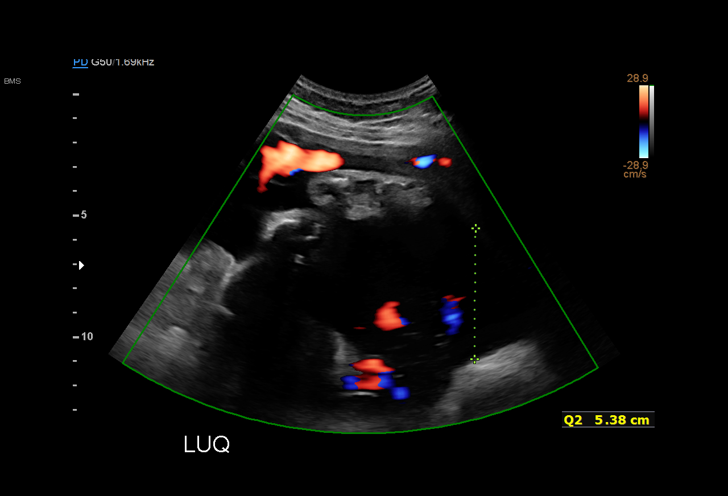
[im 19/57]
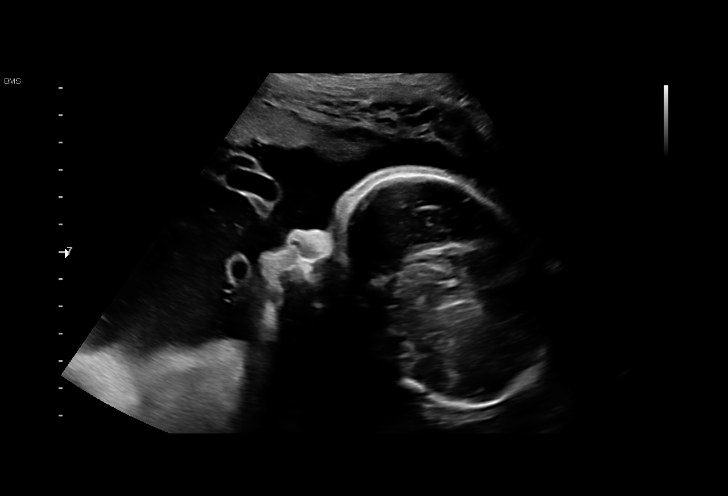
[im 23/57]
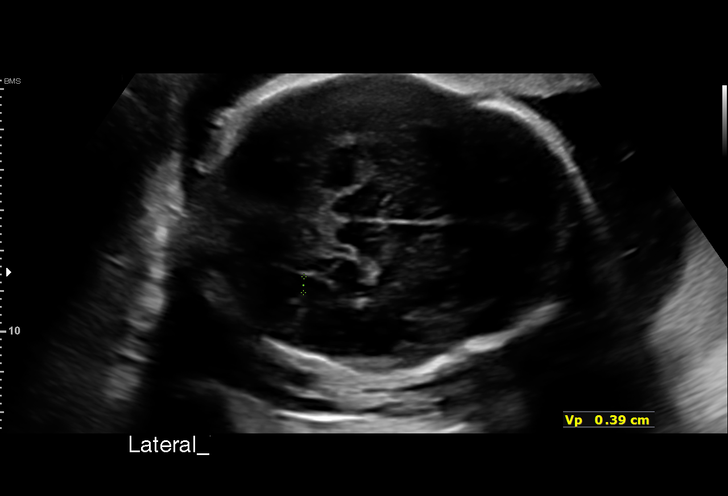
[im 30/57]
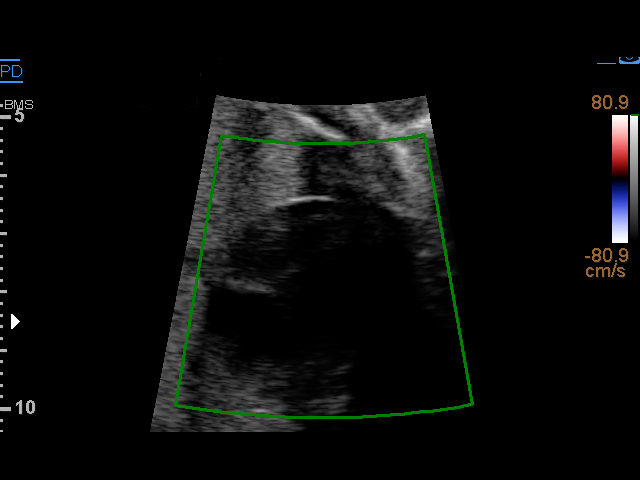
[im 34/57]
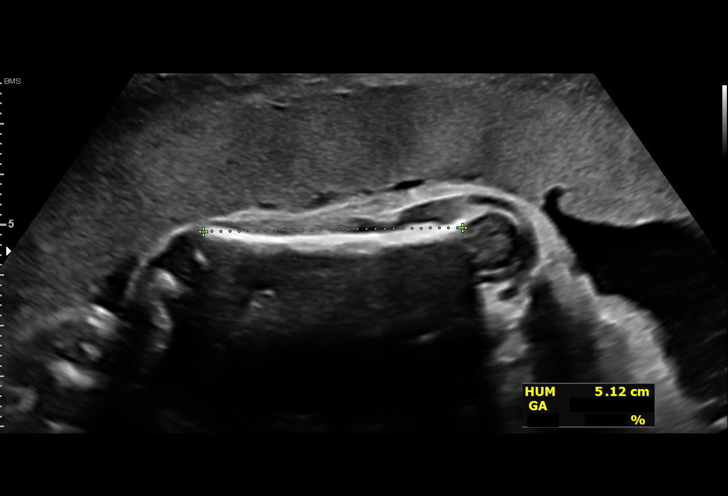
[im 38/57]
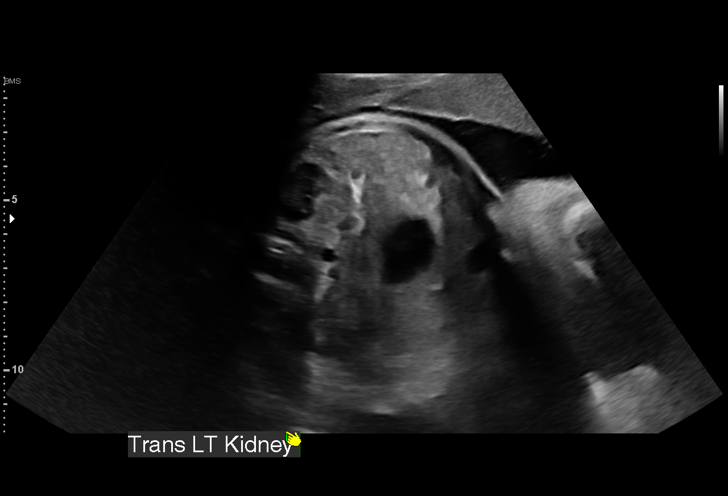
[im 42/57]
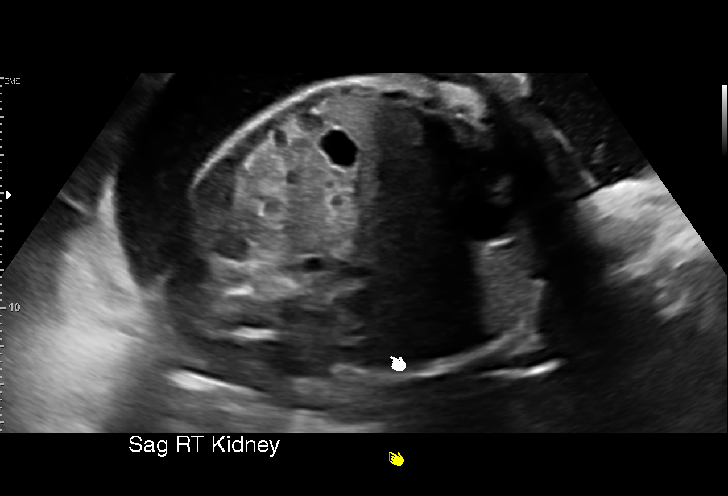
[im 46/57]
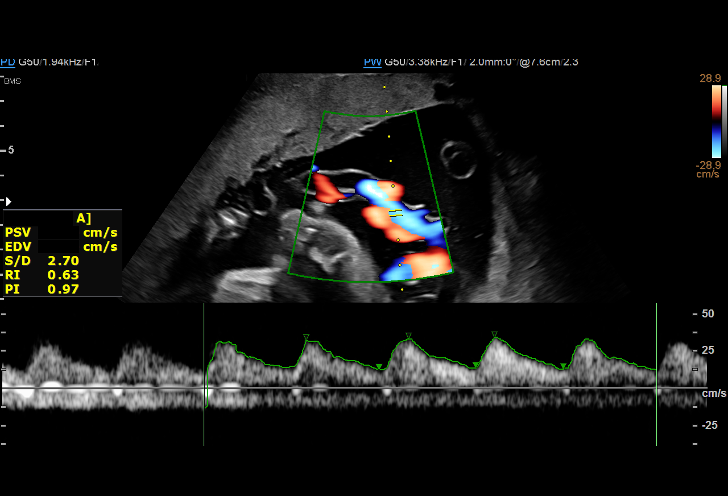
[im 50/57]
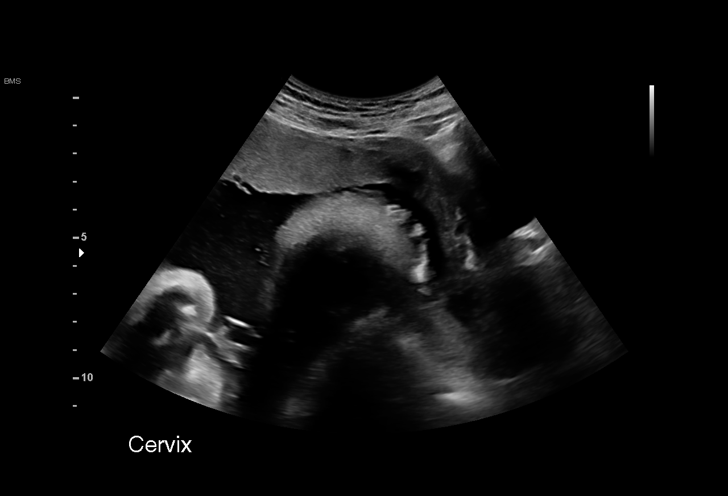
[im 54/57]
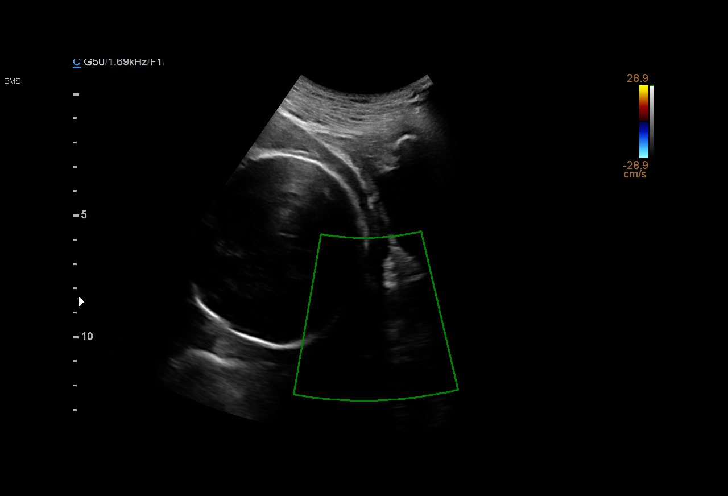

[13 of 28 positions shown; findings below may reference images not displayed]

RONLOR

 Attending:        RONLOR        Secondary Phy.:   RONLOR
                                                            RONLOR CNM

Indications

 Pre-existing diabetes, type 1, in pregnancy,   [JW]
 second trimester
 Maternal care for known or suspected poor      [JW]
 fetal growth, second trimester, not applicable
 or unspecified IUGR
 Insufficient Prenatal Care (transferred from   [JW]
 [place])
 31 weeks gestation of pregnancy
 Hypertension - Chronic with superimposed       [JW] [JW]
 preeclampsia (Labetalol)
Fetal Evaluation

 Num Of Fetuses:         1
 Fetal Heart Rate(bpm):  143
 Cardiac Activity:       Observed
 Presentation:           Cephalic
 Placenta:               Anterior
 P. Cord Insertion:      Visualized, central

 Amniotic Fluid
 AFI FV:      Within normal limits

 AFI Sum(cm)     %Tile       Largest Pocket(cm)
 21.32           83
 RUQ(cm)       RLQ(cm)       LUQ(cm)        LLQ(cm)

Biometry

 BPD:      77.6  mm     G. Age:  31w 1d         43  %    CI:        79.96   %    70 - 86
                                                         FL/HC:      20.1   %    19.3 -
 HC:      274.2  mm     G. Age:  30w 0d        3.1  %    HC/AC:      1.06        0.96 -
 AC:      257.8  mm     G. Age:  30w 0d         18  %    FL/BPD:     71.1   %    71 - 87
 FL:       55.2  mm     G. Age:  29w 1d        3.6  %    FL/AC:      21.4   %    20 - 24
 HUM:      51.2  mm     G. Age:  30w 0d         30  %

 LV:        3.9  mm

 Est. FW:    [JW]  gm      3 lb 3 oz      9  %
OB History

 Gravidity:    2         Term:   0        Prem:   0        SAB:   1
 TOP:          0       Ectopic:  0        Living: 0
Gestational Age

 LMP:           32w 6d        Date:  [DATE]                 EDD:   [DATE]
 U/S Today:     30w 1d                                        EDD:   [DATE]
 Best:          31w 0d     Det. By:  U/S  ([DATE])          EDD:   [DATE]
Anatomy

 Cranium:               Appears normal         Aortic Arch:            Previously seen
 Cavum:                 Appears normal         Ductal Arch:            Previously seen
 Ventricles:            Appears normal         Diaphragm:              Appears normal
 Choroid Plexus:        Previously seen        Stomach:                Appears normal, left
                                                                       sided
 Cerebellum:            Previously seen        Abdomen:                Previously seen
 Posterior Fossa:       Previously seen        Abdominal Wall:         Previously seen
 Nuchal Fold:           Not applicable (>20    Cord Vessels:           Previously seen
                        wks GA)
 Face:                  Orbits and profile     Kidneys:                Appear normal
                        previously seen
 Lips:                  Previously seen        Bladder:                Appears normal
 Thoracic:              Previously seen        Spine:                  Previously seen
 Heart:                 Appears normal         Upper Extremities:      Previously seen
                        (4CH, axis, and
                        situs)
 RVOT:                  Previously seen        Lower Extremities:      Previously seen
 LVOT:                  Previously seen

 Other:  Female gender previously visualized. Heels previously visualized.
         Nasal bone previously visualized. Right open hand visualized.
         IVC/SVC prev visualized. Technically difficult due to fetal position.
Doppler - Fetal Vessels

 Umbilical Artery
  S/D     %tile      RI    %tile      PI    %tile            ADFV    RDFV
  2.55       37    0.61       40    0.89       41               No      No
Cervix Uterus Adnexa

 Cervix
 Normal appearance by transabdominal scan.

 Uterus
 No abnormality visualized.
Impression

 Patient returned for fetal growth assessment.
 She has type 1 diabetes.  She takes Novolin 12 units in the
 morning and 12 units at night and NovoLog 5 units with each
 meals.  She reports her fasting levels are between 95 and
 100 mg/DL.
 Fetal echocardiography performed earlier was reported as
 normal.
 Chronic hypertension.  Well-controlled on labetalol.  Blood
 pressure today at her office is 137/89 mmHg.
 Her pregnancy is dated by first ultrasound performed at our
 office at 22 weeks gestation.
 On today's ultrasound, the estimated fetal weight is at the 9th
 percentile.  Abdominal circumference measurement is at the
 18th percentile.  Amniotic fluid is normal and good fetal
 activity seen.  Cephalic presentation.  Placenta is anterior.
 There is no free-floating umbilical cord below the presenting
 part (fetal head).
 Umbilical artery Doppler showed normal forward diastolic flow
 I explained the finding of fetal growth restriction that is difficult
 to differentiate from a constitutionally small fetus.  I explained
 our ultrasound protocol of weekly fetal monitoring.
 I encouraged the patient to bring her blood glucose log at her
 ultrasound visits.  I advised her to increase bedtime Novolin
 to 14 units.
Recommendations

 -Appointments were made for weekly BPP, UA Doppler and
 NST.
 -Fetal growth in 3 weeks.
 -Patient to bring her blood glucose log at her ultrasound visits.
                 RONLOR

## 2021-06-09 MED ORDER — MICONAZOLE NITRATE 100 MG VA SUPP: 100.0000 mg | Freq: Every day | VAGINAL | 0 refills | Status: DC

## 2021-06-09 MED ORDER — METRONIDAZOLE 500 MG PO TABS: 500.0000 mg | ORAL_TABLET | Freq: Two times a day (BID) | ORAL | 0 refills | Status: DC

## 2021-06-09 NOTE — Telephone Encounter (Signed)
Called pt's cell phone w/[employer] interpreter [phone] and she did not answer. Detailed message was left stating that 2 of her appointments in our office have been cancelled. They were on 7/21 @ 9:15 am and 7/26 @ 10:15 am. This is because her weekly ultrasounds will need to be performed on 2nd floor (MFM). Pt will be contacted to notify her of new appt dates and times. She should keep all other scheduled appts.

## 2021-06-09 NOTE — Telephone Encounter (Signed)
Scripts sent to pt's preferred pharmacy for treatment of yeast and bacterial vaginosis. Message sent to clinical pool to call pt regarding need to start medications.  Sheila Oats, MD OB Fellow, Faculty Practice 06/09/2021 10:06 PM

## 2021-06-10 ENCOUNTER — Other Ambulatory Visit: Payer: Self-pay | Admitting: *Deleted

## 2021-06-10 DIAGNOSIS — O365990 Maternal care for other known or suspected poor fetal growth, unspecified trimester, not applicable or unspecified: Secondary | ICD-10-CM

## 2021-06-11 ENCOUNTER — Other Ambulatory Visit: Payer: Self-pay | Admitting: Obstetrics and Gynecology

## 2021-06-11 ENCOUNTER — Telehealth: Payer: Self-pay | Admitting: General Practice

## 2021-06-11 NOTE — Telephone Encounter (Signed)
-----   Message from Sheila Oats, MD sent at 06/09/2021 10:00 PM EDT ----- Regarding: Please call pt re: recent lab results (needs interpreter) 1. I have sent prescriptions for medications given bacterial vaginosis and yeast to her preferred pharmacy. 2. She has a new diagnosis of preeclampsia. Please continue your labetalol as prescribed (200mg  twice daily). 3. Please continue your insulin regimen as prescribed.

## 2021-06-11 NOTE — Telephone Encounter (Signed)
Called patient with [employer] interpreter [phone] & informed her of BV and yeast infection as well as medications sent to pharmacy. Patient verbalized understanding and states her husband picked up two prescriptions yesterday, one was a cream and the other was pills and she isn't sure what this is for. Told patient that is for the two infections we just talked about. Reviewed the pills are for BV and the cream is for a yeast infection. Patient verbalized understanding. Informed her of Pre-E diagnosis and discussed importance of continuing labetalol Rx. Told patient the provider can go over this more at her next appt. Patient verbalized understanding. Reviewed with patient importance of continuing insulin regimen as prescribed as well. Patient verbalized understanding and states she has been really nauseous and throwing up the past 3 days. Patient is requesting refill on Reglan. Reglan refill sent to pharmacy per Dr Donavan Foil. Patient informed. Patient had no other questions.

## 2021-06-12 ENCOUNTER — Ambulatory Visit: Payer: Self-pay | Admitting: Registered"

## 2021-06-12 ENCOUNTER — Other Ambulatory Visit: Payer: Self-pay

## 2021-06-12 ENCOUNTER — Encounter: Payer: Medicaid Other | Admitting: Registered"

## 2021-06-12 DIAGNOSIS — O24012 Pre-existing diabetes mellitus, type 1, in pregnancy, second trimester: Secondary | ICD-10-CM

## 2021-06-12 DIAGNOSIS — O24019 Pre-existing diabetes mellitus, type 1, in pregnancy, unspecified trimester: Secondary | ICD-10-CM | POA: Diagnosis not present

## 2021-06-12 NOTE — Progress Notes (Signed)
Interpreter [place] Levo [phone] from [employer]   Patient was seen for T1DM in pregnancy self-management on 06/12/2021  Start time 0930 and End time 1000   Estimated due date: 08/11/21; [redacted]w[redacted]d  Clinical: Medications:  Novolin 12 u am/ 14 u pm;  Novolog pens 5 units with meals (including when eating during the night) Medical History: DKA and pyelonephritis, severe N/V, miscarriage last pregnancy 01/30/20 Labs: OGTT n/a, A1c 8.4% on 6/9 and 11.9% 10/25/20  Dietary and Lifestyle History: Last visit patient had financial concerns and does not know the status of her Medicaid application. [facility] navigator came into the visit to discuss options for support to help get services she needs. Phone numbers and contacts names were written down for her to reach out for more resources.   Pt states sometimes she gets hungry during the night and might eat a cheese sandwich or some corn flakes but does not inject insulin.   Patient provided glucose log. Readings for last week: FBS 98-153 mg/dL PPBG: 44-034 (1x 742) mg/dL  Breakfast and dinner look mostly WNL. Lunch is higher.   Patient states she gets hungry after lunch and often will eat fruit without taking additional insulin.   Pt states she has enough insulin at this time.  NUTRITION INTERVENTION  Nutrition education (E-1) on the following topics:   Initial Follow-up  [x]  [x]  Case Manager role in financial concerns/ Medicaid  follow-up [x]  []  Macro nutrient categories, sources of protein  [x]  []  Importance of Eye exams  []  [x]  Importance of injecting insulin with all meals or carbohydrate snacks.  Plan: Choose snacks after lunch that are higher in protein, lower in carbs. When eating before bedtime or during the night, inject 5 units of Novolog, this should get your fasting blood sugar in the target range.  Patient received handouts: none  Patient will be seen for follow-up prn.

## 2021-06-16 ENCOUNTER — Ambulatory Visit: Payer: Medicaid Other | Admitting: *Deleted

## 2021-06-16 ENCOUNTER — Other Ambulatory Visit: Payer: Self-pay

## 2021-06-16 ENCOUNTER — Ambulatory Visit: Payer: Medicaid Other | Attending: Obstetrics and Gynecology

## 2021-06-16 ENCOUNTER — Ambulatory Visit: Payer: Self-pay

## 2021-06-16 ENCOUNTER — Encounter: Payer: Self-pay | Admitting: *Deleted

## 2021-06-16 VITALS — BP 160/88 | HR 75

## 2021-06-16 DIAGNOSIS — O0990 Supervision of high risk pregnancy, unspecified, unspecified trimester: Secondary | ICD-10-CM

## 2021-06-16 DIAGNOSIS — O365990 Maternal care for other known or suspected poor fetal growth, unspecified trimester, not applicable or unspecified: Secondary | ICD-10-CM | POA: Insufficient documentation

## 2021-06-16 DIAGNOSIS — O365930 Maternal care for other known or suspected poor fetal growth, third trimester, not applicable or unspecified: Secondary | ICD-10-CM | POA: Diagnosis present

## 2021-06-16 IMAGING — US US MFM UA CORD DOPPLER
1 series · 14 of 26 positions shown · non-contrast
Comparison: none

[Series 1: us mfm ua cord doppler · 26 acquisitions, 14 frames shown]
[im 1/26]
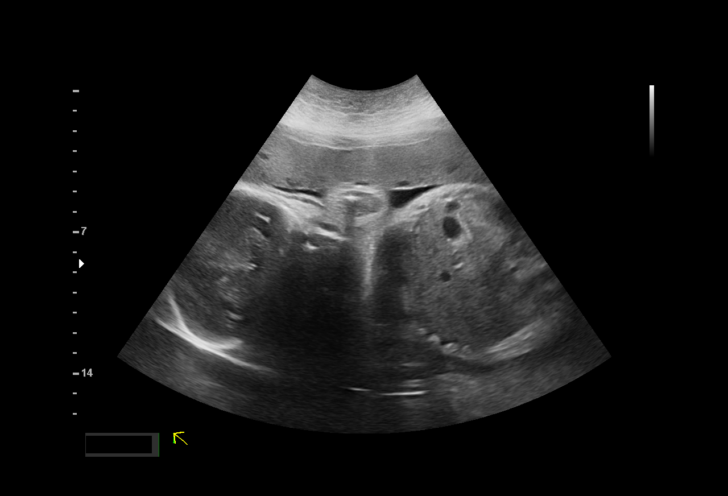
[im 3/26]
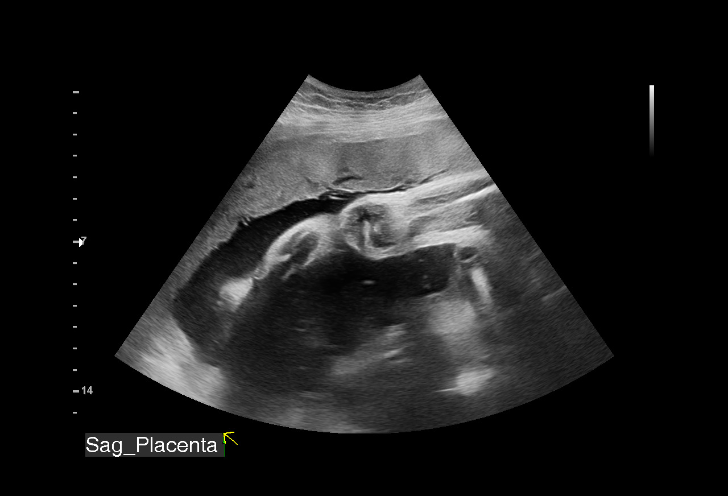
[im 5/26]
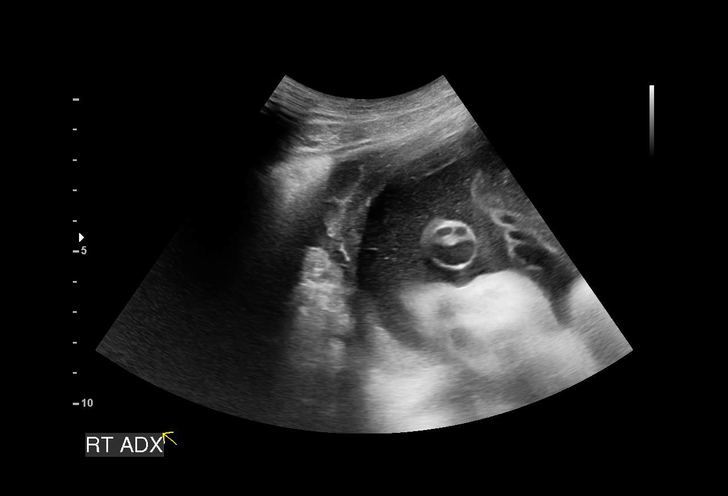
[im 7/26]
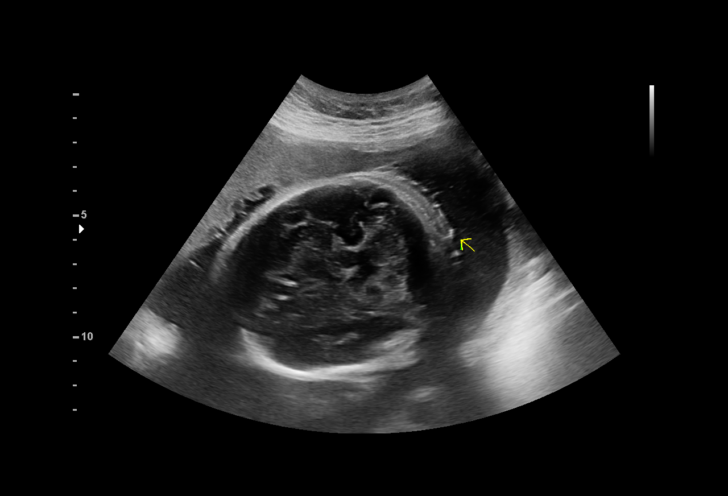
[im 9/26]
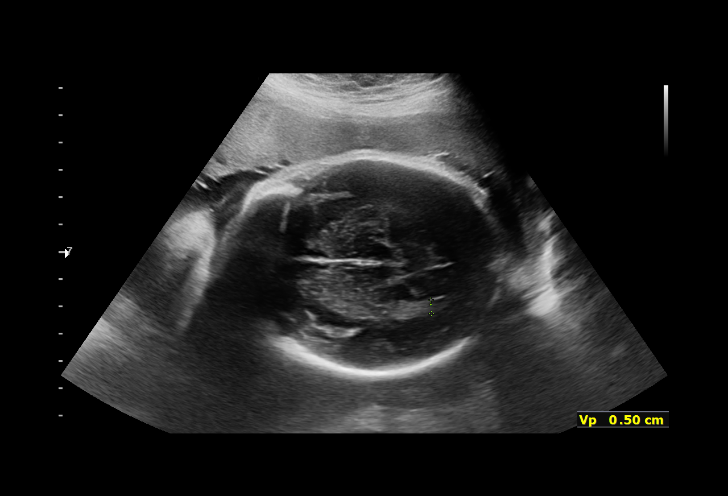
[im 11/26]
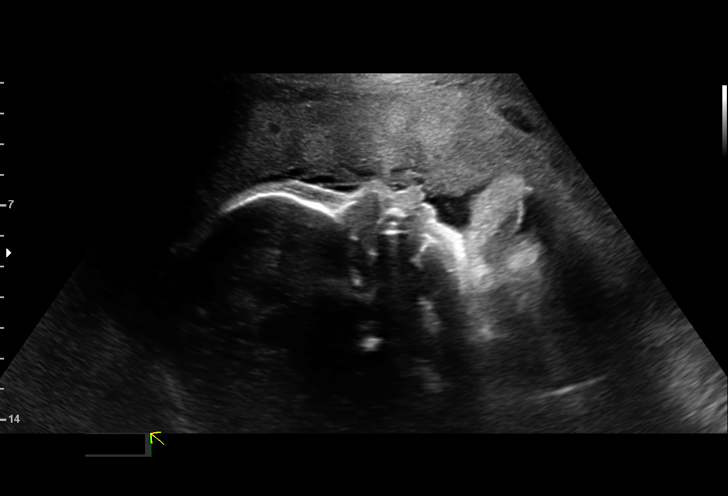
[im 13/26]
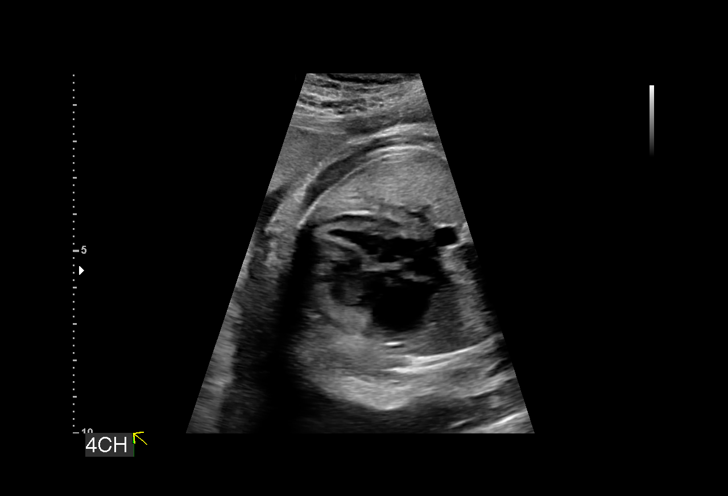
[im 14/26]
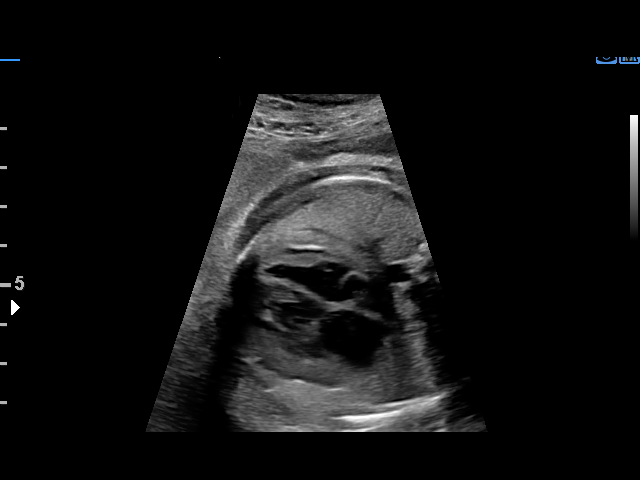
[im 16/26]
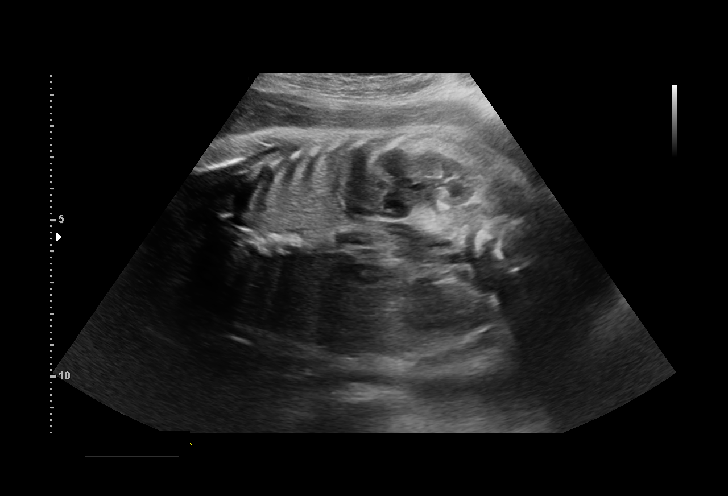
[im 18/26]
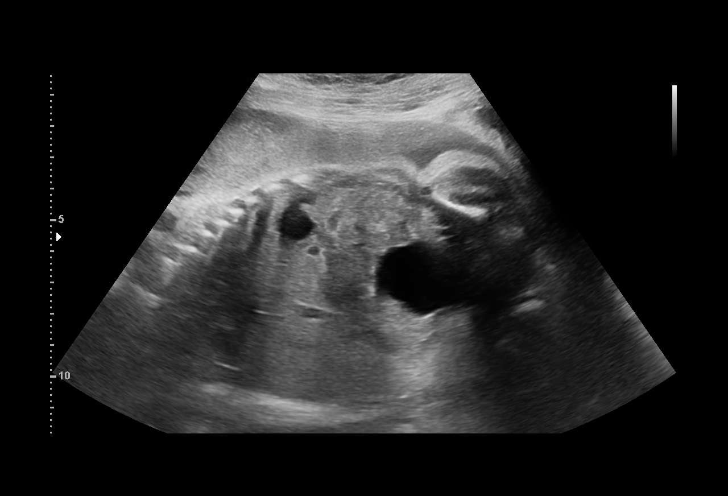
[im 20/26]
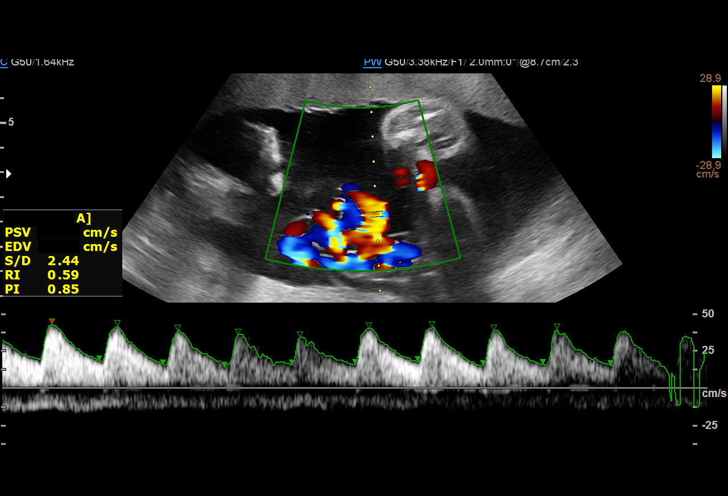
[im 22/26]
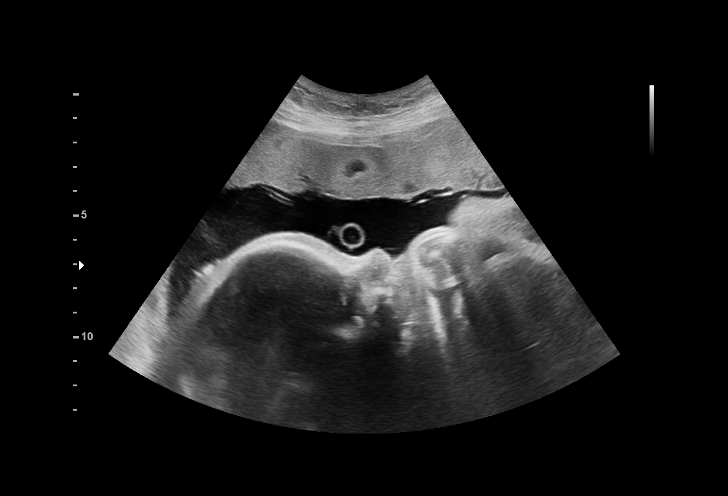
[im 24/26]
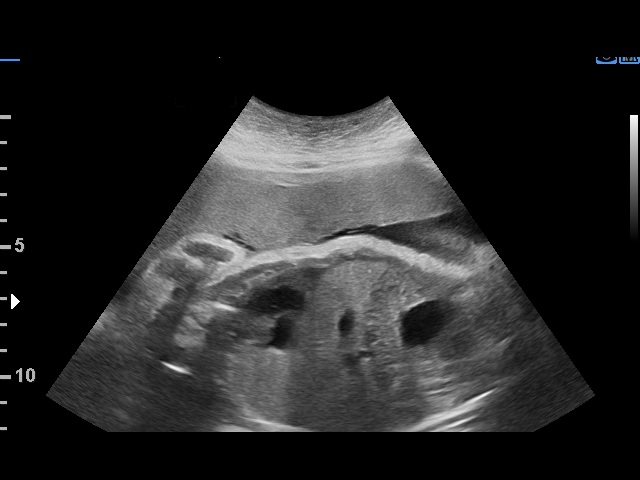
[im 26/26]
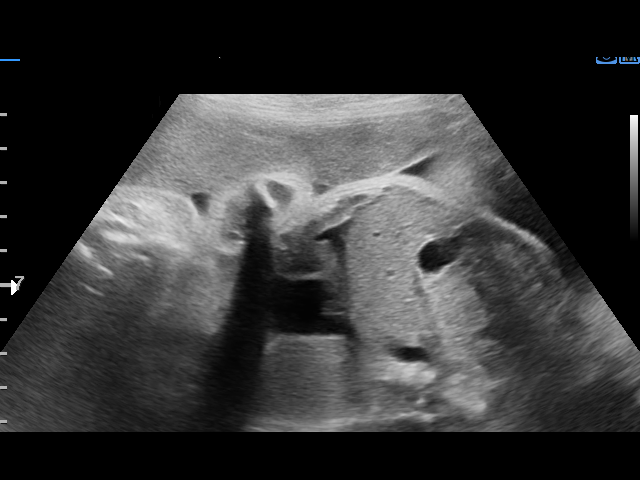

[14 of 26 positions shown; findings below may reference images not displayed]

[facility]

 Attending:        TINA        Secondary Phy.:   [facility]INA
                                                            [facility] CNM

    W/NONSTRESS

Indications

 32 weeks gestation of pregnancy
 Pre-existing diabetes, type 1, in pregnancy,   [IP]
 third trimester
 Maternal care for known or suspected poor      [IP]
 fetal growth, third trimester, not applicable or
 unspecified IUGR
 Insufficient Prenatal Care (transferred from   [IP]
 [place])
 Hypertension - Chronic with superimposed       [IP] [IP]
 preeclampsia (Labetalol)
 Advanced maternal age multigravida 35+,        [IP]
 third trimester
Fetal Evaluation

 Num Of Fetuses:         1
 Fetal Heart Rate(bpm):  138
 Cardiac Activity:       Observed
 Presentation:           Breech
 Placenta:               Anterior
 P. Cord Insertion:      Previously Visualized
 Amniotic Fluid
 AFI FV:      Within normal limits

 AFI Sum(cm)     %Tile       Largest Pocket(cm)
 22.91           89

 RUQ(cm)       RLQ(cm)       LUQ(cm)        LLQ(cm)
 5.07          5
Biophysical Evaluation

 Amniotic F.V:   Within normal limits       F. Tone:        Observed
 F. Movement:    Observed                   N.S.T:          Reactive
 F. Breathing:   Observed                   Score:          [DATE]
Biometry

 LV:          5  mm
OB History

 Gravidity:    2         Term:   0        Prem:   0        SAB:   1
 TOP:          0       Ectopic:  0        Living: 0
Gestational Age

 LMP:           33w 6d        Date:  [DATE]                 EDD:   [DATE]
 Best:          32w 0d     Det. By:  U/S  ([DATE])          EDD:   [DATE]
Anatomy

 Ventricles:            Appears normal         Kidneys:                Appear normal
 Heart:                 Appears normal         Bladder:                Appears normal
                        (4CH, axis, and
                        situs)
 Stomach:               Appears normal, left
                        sided
Doppler - Fetal Vessels

 Umbilical Artery
  S/D     %tile      RI    %tile      PI    %tile            ADFV    RDFV
  2.48       38     0.6       45    0.91       52               No      No

Cervix Uterus Adnexa

 Cervix
 Not visualized (advanced GA >[IP])

 Right Ovary
 Not visualized.

 Left Ovary
 Not visualized.
Impression

 Fetal growth restriction.  On ultrasound performed last week,
 the estimated fetal weight was at the 9th percentile.
 Patient has type 1 diabetes and takes insulin for control.
 Fetal echocardiography performed earlier was normal.
 She has chronic hypertension and takes labetalol.  Patient
 did not take her morning dose yet.  Blood pressure is today at
 her office was 160/88 mmHg and repeat 146/84 mmHg.  She
 reports she has been having intermittent headaches.  No

 Amniotic fluid is normal good fetal activity seen.  Antenatal
 testing is reassuring.  Cephalic presentation.  Umbilical artery
 Doppler showed normal forward diastolic flow.  NST is
 reactive.  BPP [DATE].
 I counseled the patient with help of language interpreter
 present in the room.  I encouraged her to go to the [facility] if
 she has severe headaches or visual disturbances or right
 upper quadrant pain.
Recommendations

 -Continue weekly BPP and UA Doppler till delivery.
                 TINA

## 2021-06-16 NOTE — Procedures (Signed)
Kristin Clay Kristin Clay Nov 19, 1986 [redacted]w[redacted]d  Fetus A Non-Stress Test Interpretation for 06/16/21  Indication: IUGR  Fetal Heart Rate A Mode: External Baseline Rate (A): 135 bpm Variability: Moderate Accelerations: 15 x 15 Decelerations: None Multiple birth?: No  Uterine Activity Mode: Palpation, Toco Contraction Frequency (min): UI Contraction Duration (sec): 10-30 Contraction Quality: Mild Resting Tone Palpated: Relaxed Resting Time: Adequate  Interpretation (Fetal Testing) Nonstress Test Interpretation: Reactive Comments: Dr. Judeth Cornfield reviewed tracing.

## 2021-06-17 ENCOUNTER — Other Ambulatory Visit: Payer: Self-pay

## 2021-06-19 ENCOUNTER — Ambulatory Visit (INDEPENDENT_AMBULATORY_CARE_PROVIDER_SITE_OTHER): Payer: Self-pay | Admitting: Family Medicine

## 2021-06-19 ENCOUNTER — Encounter: Payer: Medicaid Other | Attending: Obstetrics and Gynecology | Admitting: Registered"

## 2021-06-19 ENCOUNTER — Ambulatory Visit: Payer: Self-pay | Admitting: Registered"

## 2021-06-19 ENCOUNTER — Other Ambulatory Visit: Payer: Self-pay

## 2021-06-19 VITALS — BP 137/93 | HR 89 | Wt 118.8 lb

## 2021-06-19 DIAGNOSIS — O0990 Supervision of high risk pregnancy, unspecified, unspecified trimester: Secondary | ICD-10-CM

## 2021-06-19 DIAGNOSIS — O10919 Unspecified pre-existing hypertension complicating pregnancy, unspecified trimester: Secondary | ICD-10-CM

## 2021-06-19 DIAGNOSIS — O24019 Pre-existing diabetes mellitus, type 1, in pregnancy, unspecified trimester: Secondary | ICD-10-CM | POA: Diagnosis not present

## 2021-06-19 DIAGNOSIS — Z6791 Unspecified blood type, Rh negative: Secondary | ICD-10-CM

## 2021-06-19 DIAGNOSIS — O26893 Other specified pregnancy related conditions, third trimester: Secondary | ICD-10-CM

## 2021-06-19 DIAGNOSIS — O365939 Maternal care for other known or suspected poor fetal growth, third trimester, other fetus: Secondary | ICD-10-CM

## 2021-06-19 DIAGNOSIS — Z23 Encounter for immunization: Secondary | ICD-10-CM

## 2021-06-19 DIAGNOSIS — Z3A32 32 weeks gestation of pregnancy: Secondary | ICD-10-CM

## 2021-06-19 DIAGNOSIS — Z789 Other specified health status: Secondary | ICD-10-CM

## 2021-06-19 DIAGNOSIS — R8271 Bacteriuria: Secondary | ICD-10-CM

## 2021-06-19 DIAGNOSIS — O24012 Pre-existing diabetes mellitus, type 1, in pregnancy, second trimester: Secondary | ICD-10-CM

## 2021-06-19 HISTORY — DX: Maternal care for other known or suspected poor fetal growth, third trimester, other fetus: O36.5939

## 2021-06-19 MED ORDER — ASPIRIN 81 MG PO CHEW: 81.0000 mg | CHEWABLE_TABLET | Freq: Every day | ORAL | 1 refills | Status: DC

## 2021-06-19 NOTE — Progress Notes (Signed)
PRENATAL VISIT NOTE  Subjective:  Kristin Clay is a 35 y.o. G2P0010 at [redacted]w[redacted]d being seen today for ongoing prenatal care.  She is currently monitored for the following issues for this high-risk pregnancy and has Nausea & vomiting; Syncope; Female circumcision; Language barrier; Type 1 diabetes mellitus with ketoacidosis, uncontrolled (HCC); Acute pyelonephritis; Supervision of high risk pregnancy, antepartum; Rh negative status during pregnancy; 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, second trimester; History of diabetic ketoacidosis; Late prenatal care affecting pregnancy in second trimester; Chronic hypertension affecting pregnancy; UTI (urinary tract infection) during pregnancy; Decreased fetal movements in third trimester; GBS bacteriuria; and Poor fetal growth complicating pregnancy, antepartum, third trimester, other fetus on their problem list.  Patient reports no complaints.  Contractions: Irregular. Vag. Bleeding: None.  Movement: Present. Denies leaking of fluid.   The following portions of the patient's history were reviewed and updated as appropriate: allergies, current medications, past family history, past medical history, past social history, past surgical history and problem list.   Objective:   Vitals:   06/19/21 0946  BP: (!) 137/93  Pulse: 89  Weight: 118 lb 12.8 oz (53.9 kg)    Fetal Status: Fetal Heart Rate (bpm): 145 Fundal Height: 30 cm Movement: Present     General:  Alert, oriented and cooperative. Patient is in no acute distress.  Skin: Skin is warm and dry. No rash noted.   Cardiovascular: Normal heart rate noted  Respiratory: Normal respiratory effort, no problems with respiration noted  Abdomen: Soft, gravid, appropriate for gestational age.  Pain/Pressure: Present     Pelvic: Cervical exam deferred        Extremities: Normal range of motion.  Edema: Trace  Mental Status: Normal mood and affect. Normal behavior. Normal judgment  and thought content.   Assessment and Plan:  Pregnancy: G2P0010 at [redacted]w[redacted]d 1. Supervision of high risk pregnancy, antepartum TDaP today - Tdap vaccine greater than or equal to 7yo IM  2. Chronic hypertension affecting pregnancy BP is ok today 137/93 Continue Labetalol and ASA added - aspirin 81 MG chewable tablet; Chew 1 tablet (81 mg total) by mouth daily.  Dispense: 90 tablet; Refill: 1  3. GBS bacteriuria Will need treatment in labor  4. Language barrier Arabic interpreter: in person used  5. Rh negative status during pregnancy in third trimester S/p Rhogam  6. 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, second trimester No book, reports FBS 91-94 2 hours pp 106-111-120 Per her recall-->to see Diabetes educator today Has peripheral neuropathy and no eye exam since 2018 - Ambulatory referral to Ophthalmology - aspirin 81 MG chewable tablet; Chew 1 tablet (81 mg total) by mouth daily.  Dispense: 90 tablet; Refill: 1  7. Poor fetal growth complicating pregnancy, antepartum, third trimester, other fetus Last EFW 3 lb 3 oz (9%), vtx, AFI WNL - Nml Dopplers In testing  Preterm labor symptoms and general obstetric precautions including but not limited to vaginal bleeding, contractions, leaking of fluid and fetal movement were reviewed in detail with the patient. Please refer to After Visit Summary for other counseling recommendations.   Return in 2 weeks (on 07/03/2021) for [facility], needs MD, referral.  Future Appointments  Date Time Provider Department Center  06/23/2021  7:15 AM [facility] NURSE [facility]  06/23/2021  7:30 AM [facility] US2 [facility]  06/23/2021  8:45 AM [facility] NST [facility]  06/30/2021  7:15 AM [facility] NURSE [facility]  06/30/2021  7:30 AM [facility] US2 [facility]  06/30/2021  8:45 AM [facility] NST [facility]  07/07/2021  7:15 AM [facility] NURSE [facility]  07/07/2021  7:30 AM [facility] US2 [facility]  07/07/2021  9:45 AM [facility] NST [facility]  07/07/2021  10:35 AM Reva Bores, MD [facility]  07/17/2021  9:15 AM Constant, Gigi Gin, MD [facility]    Reva Bores, MD

## 2021-06-19 NOTE — Patient Instructions (Signed)
https://www.nichd.nih.gov/health/topics/labor-delivery/Pages/default.aspx">  Third Trimester of Pregnancy  The third trimester of pregnancy is from week 28 through week 40. This is months 7 through 9. The third trimester is a time when the unborn baby (fetus) is growing rapidly. At the end of the ninth month, the fetus is about 20inches long and weighs 6-10 pounds. Body changes during your third trimester During the third trimester, your body will continue to go through many changes.The changes vary and generally return to normal after your baby is born. Physical changes Your weight will continue to increase. You can expect to gain 25-35 pounds (11-16 kg) by the end of the pregnancy if you begin pregnancy at a normal weight. If you are underweight, you can expect to gain 28-40 lb (about 13-18 kg), and if you are overweight, you can expect to gain 15-25 lb (about 7-11 kg). You may begin to get stretch marks on your hips, abdomen, and breasts. Your breasts will continue to grow and may hurt. A yellow fluid (colostrum) may leak from your breasts. This is the first milk you are producing for your baby. You may have changes in your hair. These can include thickening of your hair, rapid growth, and changes in texture. Some people also have hair loss during or after pregnancy, or hair that feels dry or thin. Your belly button may stick out. You may notice more swelling in your hands, face, or ankles. Health changes You may have heartburn. You may have constipation. You may develop hemorrhoids. You may develop swollen, bulging veins (varicose veins) in your legs. You may have increased body aches in the pelvis, back, or thighs. This is due to weight gain and increased hormones that are relaxing your joints. You may have increased tingling or numbness in your hands, arms, and legs. The skin on your abdomen may also feel numb. You may feel short of breath because of your expanding uterus. Other  changes You may urinate more often because the fetus is moving lower into your pelvis and pressing on your bladder. You may have more problems sleeping. This may be caused by the size of your abdomen, an increased need to urinate, and an increase in your body's metabolism. You may notice the fetus "dropping," or moving lower in your abdomen (lightening). You may have increased vaginal discharge. You may notice that you have pain around your pelvic bone as your uterus distends. Follow these instructions at home: Medicines Follow your health care provider's instructions regarding medicine use. Specific medicines may be either safe or unsafe to take during pregnancy. Do not take any medicines unless approved by your health care provider. Take a prenatal vitamin that contains at least 600 micrograms (mcg) of folic acid. Eating and drinking Eat a healthy diet that includes fresh fruits and vegetables, whole grains, good sources of protein such as meat, eggs, or tofu, and low-fat dairy products. Avoid raw meat and unpasteurized juice, milk, and cheese. These carry germs that can harm you and your baby. Eat 4 or 5 small meals rather than 3 large meals a day. You may need to take these actions to prevent or treat constipation: Drink enough fluid to keep your urine pale yellow. Eat foods that are high in fiber, such as beans, whole grains, and fresh fruits and vegetables. Limit foods that are high in fat and processed sugars, such as fried or sweet foods. Activity Exercise only as directed by your health care provider. Most people can continue their usual exercise routine during pregnancy. Try   to exercise for 30 minutes at least 5 days a week. Stop exercising if you experience contractions in the uterus. Stop exercising if you develop pain or cramping in the lower abdomen or lower back. Avoid heavy lifting. Do not exercise if it is very hot or humid or if you are at a high altitude. If you choose to,  you may continue to have sex unless your health care provider tells you not to. Relieving pain and discomfort Take frequent breaks and rest with your legs raised (elevated) if you have leg cramps or low back pain. Take warm sitz baths to soothe any pain or discomfort caused by hemorrhoids. Use hemorrhoid cream if your health care provider approves. Wear a supportive bra to prevent discomfort from breast tenderness. If you develop varicose veins: Wear support hose as told by your health care provider. Elevate your feet for 15 minutes, 3-4 times a day. Limit salt in your diet. Safety Talk to your health care provider before traveling far distances. Do not use hot tubs, steam rooms, or saunas. Wear your seat belt at all times when driving or riding in a car. Talk with your health care provider if someone is verbally or physically abusive to you. Preparing for birth To prepare for the arrival of your baby: Take prenatal classes to understand, practice, and ask questions about labor and delivery. Visit the hospital and tour the maternity area. Purchase a rear-facing car seat and make sure you know how to install it in your car. Prepare the baby's room or sleeping area. Make sure to remove all pillows and stuffed animals from the baby's crib to prevent suffocation. General instructions Avoid cat litter boxes and soil used by cats. These carry germs that can cause birth defects in the baby. If you have a cat, ask someone to clean the litter box for you. Do not douche or use tampons. Do not use scented sanitary pads. Do not use any products that contain nicotine or tobacco, such as cigarettes, e-cigarettes, and chewing tobacco. If you need help quitting, ask your health care provider. Do not use any herbal remedies, illegal drugs, or medicines that were not prescribed to you. Chemicals in these products can harm your baby. Do not drink alcohol. You will have more frequent prenatal exams during the  third trimester. During a routine prenatal visit, your health care provider will do a physical exam, perform tests, and discuss your overall health. Keep all follow-up visits. This is important. Where to find more information American Pregnancy Association: americanpregnancy.org American College of Obstetricians and Gynecologists: acog.org/en/Womens%20Health/Pregnancy Office on Women's Health: womenshealth.gov/pregnancy Contact a health care provider if you have: A fever. Mild pelvic cramps, pelvic pressure, or nagging pain in your abdominal area or lower back. Vomiting or diarrhea. Bad-smelling vaginal discharge or foul-smelling urine. Pain when you urinate. A headache that does not go away when you take medicine. Visual changes or see spots in front of your eyes. Get help right away if: Your water breaks. You have regular contractions less than 5 minutes apart. You have spotting or bleeding from your vagina. You have severe abdominal pain. You have difficulty breathing. You have chest pain. You have fainting spells. You have not felt your baby move for the time period told by your health care provider. You have new or increased pain, swelling, or redness in an arm or leg. Summary The third trimester of pregnancy is from week 28 through week 40 (months 7 through 9). You may have more problems   sleeping. This can be caused by the size of your abdomen, an increased need to urinate, and an increase in your body's metabolism. You will have more frequent prenatal exams during the third trimester. Keep all follow-up visits. This is important. This information is not intended to replace advice given to you by your health care provider. Make sure you discuss any questions you have with your healthcare provider. Document Revised: 04/17/2020 Document Reviewed: 02/22/2020 Elsevier Patient Education  2022 Elsevier Inc.  

## 2021-06-19 NOTE — Progress Notes (Signed)
In-person Interpreter Enayat from [facility]   Patient was seen for T1DM in pregnancy self-management on 06/19/2021  Start time 1020 and End time 1050   Estimated due date: 08/11/21; [redacted]w[redacted]d  Clinical: Medications:  Novolin 12 u am/ 14 u pm;  Novolog pens 5 units with meals, 2 units with snacks Medical History: DKA and pyelonephritis, severe N/V, miscarriage last pregnancy 01/30/20 Labs: OGTT n/a, A1c 8.4% on 6/9 and 11.9% 10/25/20  Dietary and Lifestyle History: Last visit patient had financial concerns and does not know the status of her Medicaid application. Pt reports she went to [facility] to fill out application 06/06/21.   Pt states since last visit she has started injecting insulin when she eats during the night. Patient states stopped eating snacks after lunch even though she is hungry. RD recommended she take 2 units of Novolog if she wants to eat a snack.  Patient forgot glucose log. Reported readings for last week: FBS 91-97 mg/dL PPBG: 914-782 mg/dL  Breakfast and dinner look mostly WNL. Lunch is higher.   Hypoglycemia: Patient states uses chocolate or watermelon to raise blood sugar. Pt reports the pharmacist told her she can eat 1-2 glucose tablets to raise blood sugar.  Patient had question about surgery on hand where diabetes has affected it. Advised to ask MD at her next visit.  NUTRITION INTERVENTION  Nutrition education (E-1) on the following topics:   Initial Follow-up  [x]  [x]  Case Manager role in financial concerns/ Medicaid  follow-up [x]  []  Macro nutrient categories, sources of protein  [x]  []  Importance of Eye exams  []  [x]  Importance of injecting insulin with all meals or carbohydrate snacks. (7/21)  []  [x]  Difference between type 1 and type 2 diabetes and reason for using insulin for snacks (7/28)  Plan: Eat snacks when hungry and inject 2 units of insulin to cover. Contact me when your Medicaid is approved so we can look into getting a CGM and insulin  pump.  Patient received handouts: none  Patient will be seen for follow-up prn.

## 2021-06-23 ENCOUNTER — Encounter: Payer: Self-pay | Admitting: *Deleted

## 2021-06-23 ENCOUNTER — Other Ambulatory Visit: Payer: Self-pay

## 2021-06-23 ENCOUNTER — Encounter (HOSPITAL_COMMUNITY): Payer: Self-pay | Admitting: Obstetrics and Gynecology

## 2021-06-23 ENCOUNTER — Inpatient Hospital Stay (HOSPITAL_COMMUNITY)
Admission: AD | Admit: 2021-06-23 | Discharge: 2021-07-02 | DRG: 768 | Disposition: A | Discharge status: Home / Self Care | Payer: 59 | Attending: Obstetrics & Gynecology | Admitting: Obstetrics & Gynecology

## 2021-06-23 ENCOUNTER — Ambulatory Visit (HOSPITAL_BASED_OUTPATIENT_CLINIC_OR_DEPARTMENT_OTHER): Payer: 59 | Admitting: Obstetrics

## 2021-06-23 ENCOUNTER — Ambulatory Visit: Payer: 59 | Admitting: *Deleted

## 2021-06-23 ENCOUNTER — Ambulatory Visit (HOSPITAL_BASED_OUTPATIENT_CLINIC_OR_DEPARTMENT_OTHER): Payer: 59

## 2021-06-23 VITALS — BP 157/87

## 2021-06-23 VITALS — BP 165/95 | HR 88

## 2021-06-23 DIAGNOSIS — O1493 Unspecified pre-eclampsia, third trimester: Secondary | ICD-10-CM | POA: Insufficient documentation

## 2021-06-23 DIAGNOSIS — O2343 Unspecified infection of urinary tract in pregnancy, third trimester: Secondary | ICD-10-CM | POA: Insufficient documentation

## 2021-06-23 DIAGNOSIS — O365939 Maternal care for other known or suspected poor fetal growth, third trimester, other fetus: Secondary | ICD-10-CM | POA: Insufficient documentation

## 2021-06-23 DIAGNOSIS — Z3A33 33 weeks gestation of pregnancy: Secondary | ICD-10-CM | POA: Insufficient documentation

## 2021-06-23 DIAGNOSIS — O365990 Maternal care for other known or suspected poor fetal growth, unspecified trimester, not applicable or unspecified: Secondary | ICD-10-CM

## 2021-06-23 DIAGNOSIS — O321XX0 Maternal care for breech presentation, not applicable or unspecified: Secondary | ICD-10-CM | POA: Diagnosis present

## 2021-06-23 DIAGNOSIS — O24012 Pre-existing diabetes mellitus, type 1, in pregnancy, second trimester: Secondary | ICD-10-CM

## 2021-06-23 DIAGNOSIS — O0990 Supervision of high risk pregnancy, unspecified, unspecified trimester: Secondary | ICD-10-CM

## 2021-06-23 DIAGNOSIS — O10913 Unspecified pre-existing hypertension complicating pregnancy, third trimester: Secondary | ICD-10-CM | POA: Insufficient documentation

## 2021-06-23 DIAGNOSIS — Z6791 Unspecified blood type, Rh negative: Secondary | ICD-10-CM

## 2021-06-23 DIAGNOSIS — O114 Pre-existing hypertension with pre-eclampsia, complicating childbirth: Principal | ICD-10-CM | POA: Diagnosis present

## 2021-06-23 DIAGNOSIS — Z20822 Contact with and (suspected) exposure to covid-19: Secondary | ICD-10-CM | POA: Diagnosis present

## 2021-06-23 DIAGNOSIS — Z794 Long term (current) use of insulin: Secondary | ICD-10-CM

## 2021-06-23 DIAGNOSIS — O2402 Pre-existing diabetes mellitus, type 1, in childbirth: Secondary | ICD-10-CM | POA: Diagnosis present

## 2021-06-23 DIAGNOSIS — O368130 Decreased fetal movements, third trimester, not applicable or unspecified: Secondary | ICD-10-CM | POA: Diagnosis present

## 2021-06-23 DIAGNOSIS — N90810 Female genital mutilation status, unspecified: Secondary | ICD-10-CM | POA: Diagnosis present

## 2021-06-23 DIAGNOSIS — O1002 Pre-existing essential hypertension complicating childbirth: Secondary | ICD-10-CM | POA: Diagnosis present

## 2021-06-23 DIAGNOSIS — O99824 Streptococcus B carrier state complicating childbirth: Secondary | ICD-10-CM | POA: Diagnosis present

## 2021-06-23 DIAGNOSIS — O26893 Other specified pregnancy related conditions, third trimester: Secondary | ICD-10-CM | POA: Diagnosis present

## 2021-06-23 DIAGNOSIS — O26899 Other specified pregnancy related conditions, unspecified trimester: Secondary | ICD-10-CM

## 2021-06-23 DIAGNOSIS — O99284 Endocrine, nutritional and metabolic diseases complicating childbirth: Secondary | ICD-10-CM | POA: Diagnosis present

## 2021-06-23 DIAGNOSIS — O24113 Pre-existing diabetes mellitus, type 2, in pregnancy, third trimester: Secondary | ICD-10-CM | POA: Insufficient documentation

## 2021-06-23 DIAGNOSIS — O1490 Unspecified pre-eclampsia, unspecified trimester: Secondary | ICD-10-CM

## 2021-06-23 DIAGNOSIS — E10649 Type 1 diabetes mellitus with hypoglycemia without coma: Secondary | ICD-10-CM | POA: Diagnosis present

## 2021-06-23 DIAGNOSIS — Z7982 Long term (current) use of aspirin: Secondary | ICD-10-CM

## 2021-06-23 DIAGNOSIS — E1010 Type 1 diabetes mellitus with ketoacidosis without coma: Secondary | ICD-10-CM | POA: Diagnosis present

## 2021-06-23 DIAGNOSIS — O24013 Pre-existing diabetes mellitus, type 1, in pregnancy, third trimester: Secondary | ICD-10-CM | POA: Diagnosis present

## 2021-06-23 DIAGNOSIS — O119 Pre-existing hypertension with pre-eclampsia, unspecified trimester: Secondary | ICD-10-CM

## 2021-06-23 DIAGNOSIS — O113 Pre-existing hypertension with pre-eclampsia, third trimester: Secondary | ICD-10-CM | POA: Diagnosis not present

## 2021-06-23 DIAGNOSIS — Z789 Other specified health status: Secondary | ICD-10-CM | POA: Diagnosis present

## 2021-06-23 DIAGNOSIS — O365930 Maternal care for other known or suspected poor fetal growth, third trimester, not applicable or unspecified: Secondary | ICD-10-CM | POA: Diagnosis present

## 2021-06-23 HISTORY — DX: Pre-existing hypertension with pre-eclampsia, unspecified trimester: O11.9

## 2021-06-23 HISTORY — DX: Essential (primary) hypertension: I10

## 2021-06-23 LAB — CBC
HCT: 39.9 % (ref 36.0–46.0)
Hemoglobin: 13 g/dL (ref 12.0–15.0)
MCH: 28.4 pg (ref 26.0–34.0)
MCHC: 32.6 g/dL (ref 30.0–36.0)
MCV: 87.1 fL (ref 80.0–100.0)
Platelets: 249 10*3/uL (ref 150–400)
RBC: 4.58 MIL/uL (ref 3.87–5.11)
RDW: 14.3 % (ref 11.5–15.5)
WBC: 3.6 10*3/uL — ABNORMAL LOW (ref 4.0–10.5)
nRBC: 0 % (ref 0.0–0.2)

## 2021-06-23 LAB — URINALYSIS, ROUTINE W REFLEX MICROSCOPIC
Bilirubin Urine: NEGATIVE
Glucose, UA: 150 mg/dL — AB
Hgb urine dipstick: NEGATIVE
Ketones, ur: 5 mg/dL — AB
Leukocytes,Ua: NEGATIVE
Nitrite: NEGATIVE
Protein, ur: 100 mg/dL — AB
Specific Gravity, Urine: 1.019 (ref 1.005–1.030)
pH: 6 (ref 5.0–8.0)

## 2021-06-23 LAB — COMPREHENSIVE METABOLIC PANEL
ALT: 9 U/L (ref 0–44)
AST: 12 U/L — ABNORMAL LOW (ref 15–41)
Albumin: 2.5 g/dL — ABNORMAL LOW (ref 3.5–5.0)
Alkaline Phosphatase: 90 U/L (ref 38–126)
Anion gap: 7 (ref 5–15)
BUN: <5 mg/dL — ABNORMAL LOW (ref 6–20)
CO2: 21 mmol/L — ABNORMAL LOW (ref 22–32)
Calcium: 8.2 mg/dL — ABNORMAL LOW (ref 8.9–10.3)
Chloride: 108 mmol/L (ref 98–111)
Creatinine, Ser: 0.34 mg/dL — ABNORMAL LOW (ref 0.44–1.00)
GFR, Estimated: >60 mL/min (ref >60)
Glucose, Bld: 151 mg/dL — ABNORMAL HIGH (ref 70–99)
Potassium: 3.3 mmol/L — ABNORMAL LOW (ref 3.5–5.1)
Sodium: 136 mmol/L (ref 135–145)
Total Bilirubin: 0.6 mg/dL (ref 0.3–1.2)
Total Protein: 6.1 g/dL — ABNORMAL LOW (ref 6.5–8.1)

## 2021-06-23 LAB — GLUCOSE, CAPILLARY
Glucose-Capillary: 164 mg/dL — ABNORMAL HIGH (ref 70–99)
Glucose-Capillary: 66 mg/dL — ABNORMAL LOW (ref 70–99)
Glucose-Capillary: 68 mg/dL — ABNORMAL LOW (ref 70–99)
Glucose-Capillary: 184 mg/dL — ABNORMAL HIGH (ref 70–99)

## 2021-06-23 LAB — RESP PANEL BY RT-PCR (FLU A&B, COVID) ARPGX2
Influenza A by PCR: NEGATIVE
Influenza B by PCR: NEGATIVE
SARS Coronavirus 2 by RT PCR: NEGATIVE

## 2021-06-23 LAB — TYPE AND SCREEN
ABO/RH(D): A NEG
Antibody Screen: POSITIVE

## 2021-06-23 IMAGING — US US MFM UA CORD DOPPLER
1 series · 12 of 28 positions shown · non-contrast
Comparison: none

[Series 1: us mfm ua cord doppler · 34 acquisitions, 12 frames shown]
[im 2/34]
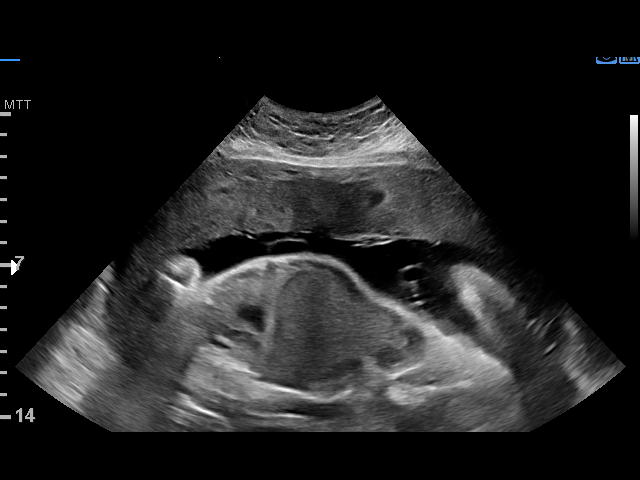
[im 4/34]
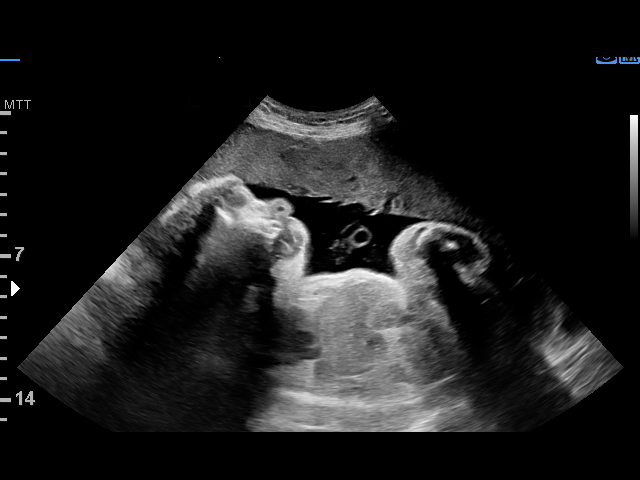
[im 7/34]
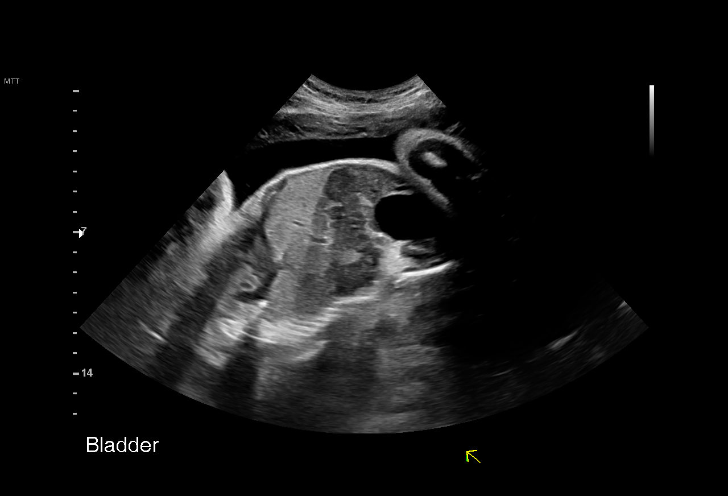
[im 10/34]
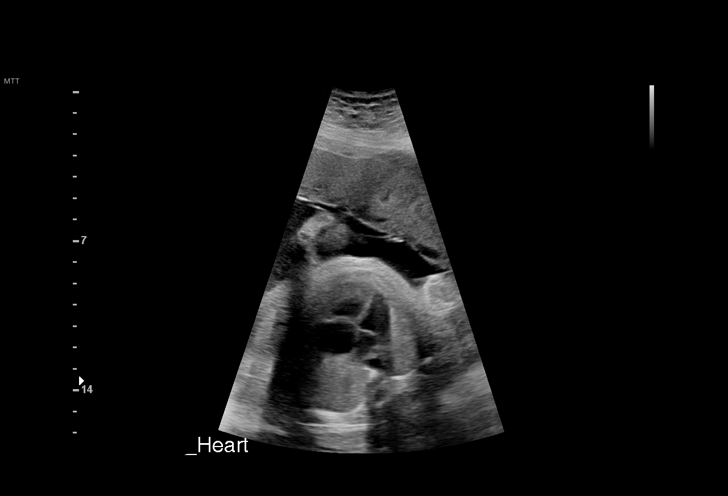
[im 13/34]
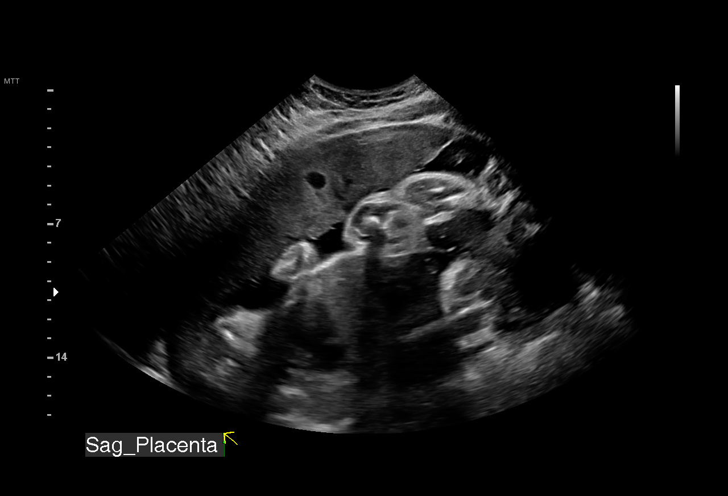
[im 15/34]
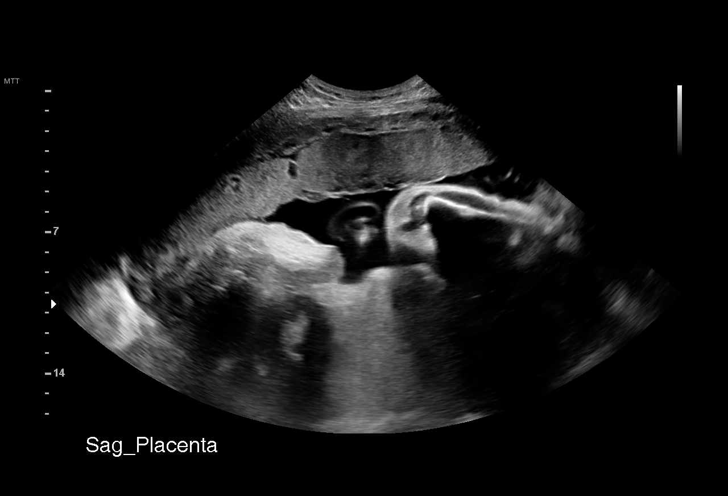
[im 19/34]
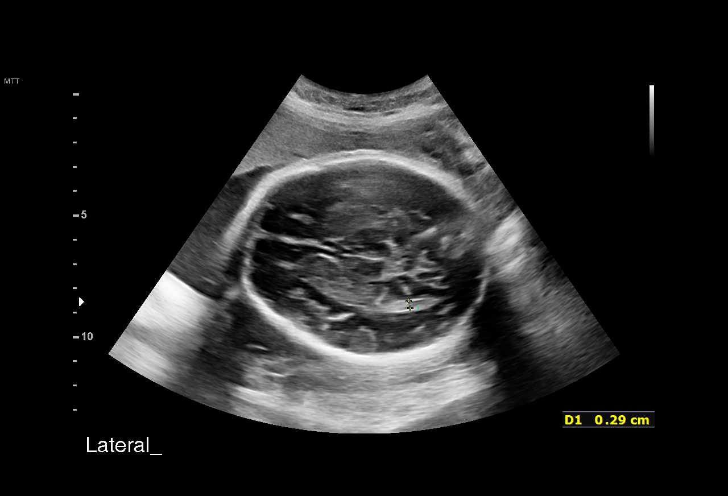
[im 21/34]
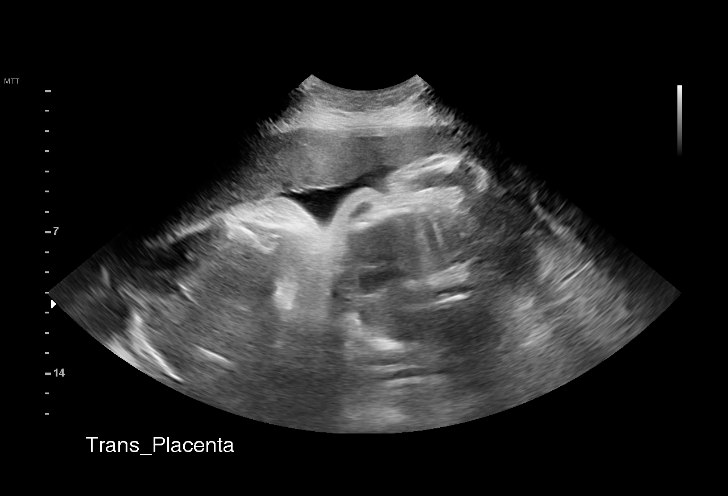
[im 24/34]
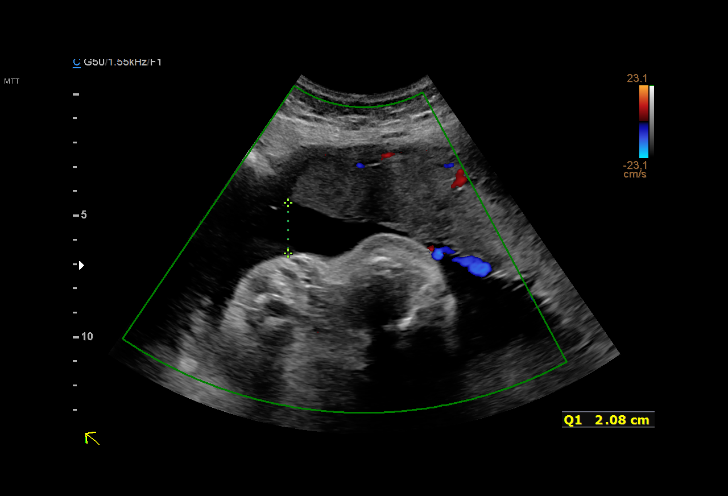
[im 27/34]
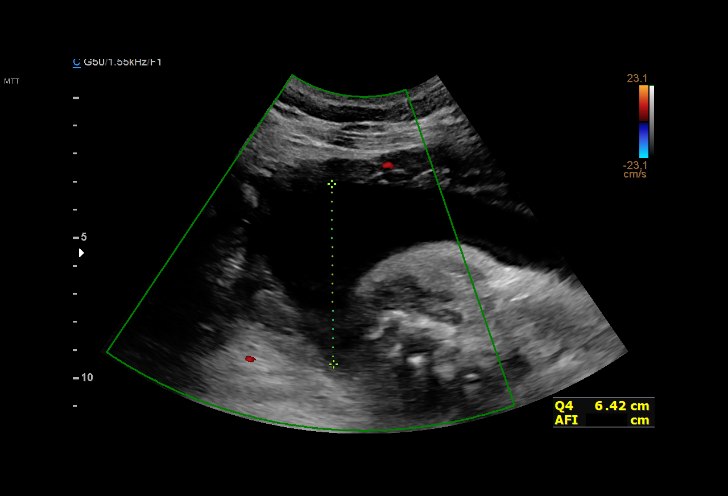
[im 30/34]
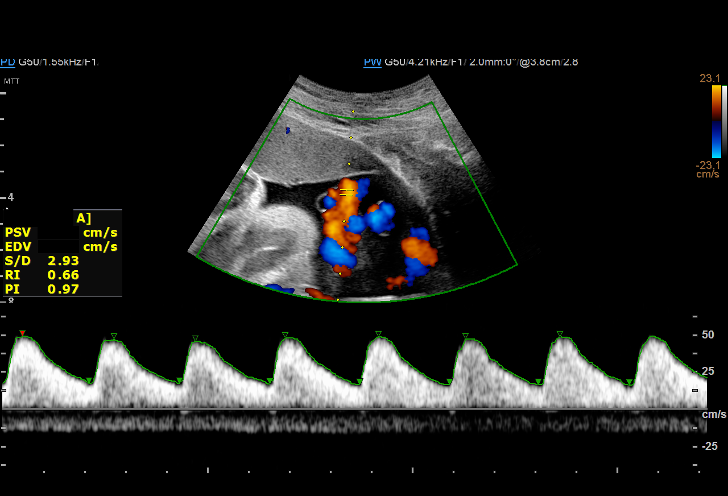
[im 32/34]
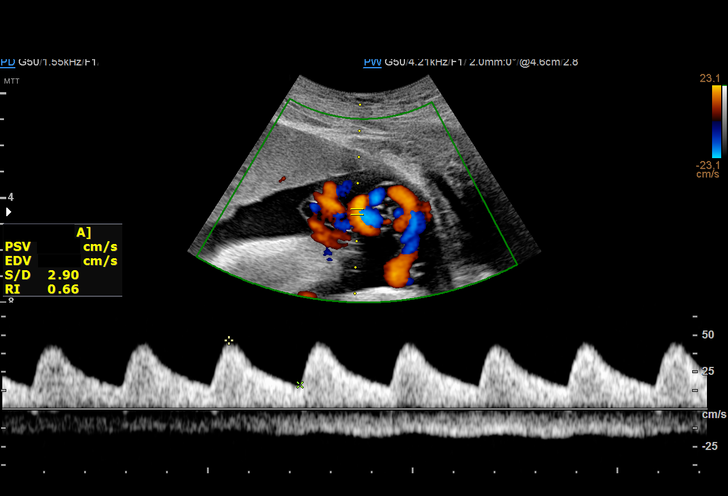

[12 of 28 positions shown; findings below may reference images not displayed]

SORIN

                                                            SORIN CNM
                   [FQ]

    W/NONSTRESS

Indications

 Maternal care for known or suspected poor      [FQ]
 fetal growth, third trimester, not applicable or
 unspecified IUGR
 Advanced maternal age multigravida 35+,        [FQ]
 third trimester
 Hypertension - Chronic with superimposed       [FQ] [FQ]
 preeclampsia (Labetalol)
 Pre-existing diabetes, type 1, in pregnancy,   [FQ]
 third trimester
 33 weeks gestation of pregnancy
 Insufficient Prenatal Care (transferred from   [FQ]
 [place])
Fetal Evaluation

 Num Of Fetuses:         1
 Fetal Heart Rate(bpm):  131
 Cardiac Activity:       Observed
 Presentation:           Breech
 Placenta:               Anterior
 P. Cord Insertion:      Previously Visualized
 Amniotic Fluid
 AFI FV:      Within normal limits

 AFI Sum(cm)     %Tile       Largest Pocket(cm)
 19.8            74

 RUQ(cm)       RLQ(cm)       LUQ(cm)        LLQ(cm)
 2.1           6.4           7.3            4
Biophysical Evaluation

 Amniotic F.V:   Pocket => 2 cm             F. Tone:        Observed
 F. Movement:    Observed                   N.S.T:          Nonreactive
 F. Breathing:   Observed                   Score:          [DATE]
OB History

 Gravidity:    2         Term:   0        Prem:   0        SAB:   1
 TOP:          0       Ectopic:  0        Living: 0
Gestational Age

 LMP:           34w 6d        Date:  [DATE]                 EDD:   [DATE]
 Best:          33w 0d     Det. By:  U/S  ([DATE])          EDD:   [DATE]
Anatomy

 Cranium:               Previously seen        Aortic Arch:            Previously seen
 Cavum:                 Previously seen        Ductal Arch:            Previously seen
 Ventricles:            Previously seen        Diaphragm:              Previously seen
 Choroid Plexus:        Previously seen        Stomach:                Previously Seen
 Cerebellum:            Previously seen        Abdomen:                Previously seen
 Posterior Fossa:       Previously seen        Abdominal Wall:         Previously seen
 Nuchal Fold:           Not applicable (>20    Cord Vessels:           Previously seen
                        wks GA)
 Face:                  Orbits and profile     Kidneys:                Previously seen
                        previously seen
 Lips:                  Previously seen        Bladder:                Previously seen
 Thoracic:              Previously seen        Spine:                  Previously seen
 Heart:                 Previously seen        Upper Extremities:      Previously seen
 RVOT:                  Previously seen        Lower Extremities:      Previously seen
 LVOT:                  Previously seen

 Other:  Female gender previously visualized. Heels, Nasal bone, Right open
         hand, and IVC/SVC prev. visualized. Technically difficult due to fetal
         position.
Doppler - Fetal Vessels

 Umbilical Artery
  S/D     %tile      RI    %tile      PI    %tile            ADFV    RDFV
  3.06       76    0.67       78    1.05       80               No      No

Cervix Uterus Adnexa
 Cervix
 Not visualized (advanced GA >[FQ])

 Uterus
 No abnormality visualized.

 Cul De Sac
 No free fluid seen.

 Adnexa
 No abnormality visualized.
Comments

 SORIN was seen for a biophysical profile due to
 chronic hypertension treated with labetalol and pregestational
 diabetes treated with insulin.  She was diagnosed with
 preeclampsia few weeks ago when her P/C ratio indicated
 new onset proteinuria.  IUGR has also been noted in her
 current pregnancy.  Her last growth scan two weeks ago
 showed an EFW of 3 pounds 3 ounces (9th percentile for her
 gestational age).

 The patient reports that she has experienced a headache and
 upper abdominal pain for the past 3 days.  Her blood
 pressures in our office today were 163/92 and 162/95.

 A biophysical profile performed today was [DATE].  She
 also had a nonreactive nonstress test making her total
 biophysical profile score [DATE].  There was normal
 amniotic fluid noted today.

 Doppler studies of the umbilical arteries performed today
 showed a normal S/D ratio of 3.06.  There were no signs of
 absent or reversed diastolic flow.

 Due to her elevated blood pressures and symptoms along
 with the fact that she has been diagnosed with preeclampsia,
 the patient was sent to the hospital for admission to receive a
 complete course of antenatal corticosteroids and
 observation/delivery.

 Due to preeclampsia with fetal growth restriction, I would
 recommend inpatient management until delivery.  She may
 continue the antihypertensive medications (labetalol) for
 blood pressure control.
 As she is complaining of a headache,I would recommend that
 she be continued on magnesium sulfate for maternal seizure
 prophylaxis until her antenatal corticosteroid course is
 completed.
 The goal for her delivery would be 34 weeks.
 Delivery prior to 34 weeks would be recommended if her
 blood pressures remain persistently greater than 160/100
 despite medication treatment, should she require IV push
 antihypertensive medication for blood pressure control,
 should she complain of a persistent severe headache, should
 her liver function tests increase or platelet counts decrease,
 or should there be nonreassuring fetal status.
 All conversations were held with the patient today with the
 help of an Arabic interpreter.
 Recommendations:
 Inpatient management until delivery
 Continue magnesium sulfate for maternal seizure prophylaxis
 until her antenatal corticosteroid course is completed
 Continue labetalol for blood pressure control
 Delivery at 34 weeks
 Delivery prior to 34 weeks would be indicated:
          Should her blood pressures be persistently greater
 than 160/100 despite treatment
          Should she require IV push medication for blood
 pressure control
          Should she complain of any signs and symptoms of
 severe preeclampsia
          Should her PIH labs be abnormal
          At any time for nonreassuring fetal status
 A total of 30 minutes was spent counseling and coordinating
 the care for this patient.

## 2021-06-23 MED ORDER — PRENATAL MULTIVITAMIN CH
1.0000 | ORAL_TABLET | Freq: Every day | ORAL | Status: DC
  Administered 2021-06-24 – 2021-06-27 (×4): 1 via ORAL
  Filled 2021-06-23 (×4): qty 1

## 2021-06-23 MED ORDER — LABETALOL HCL 200 MG PO TABS
200.0000 mg | ORAL_TABLET | Freq: Two times a day (BID) | ORAL | Status: DC
  Administered 2021-06-23 – 2021-06-24 (×3): 200 mg via ORAL
  Filled 2021-06-23 (×3): qty 1

## 2021-06-23 MED ORDER — INSULIN NPH (HUMAN) (ISOPHANE) 100 UNIT/ML ~~LOC~~ SUSP
12.0000 [IU] | Freq: Two times a day (BID) | SUBCUTANEOUS | Status: DC
  Administered 2021-06-23 – 2021-06-26 (×6): 12 [IU] via SUBCUTANEOUS
  Filled 2021-06-23: qty 10

## 2021-06-23 MED ORDER — PRENATAL MULTIVITAMIN CH: 1.0000 | ORAL_TABLET | Freq: Every day | ORAL | Status: DC

## 2021-06-23 MED ORDER — DOCUSATE SODIUM 100 MG PO CAPS
100.0000 mg | ORAL_CAPSULE | Freq: Every day | ORAL | Status: DC
  Administered 2021-06-24 – 2021-06-27 (×4): 100 mg via ORAL
  Filled 2021-06-23 (×4): qty 1

## 2021-06-23 MED ORDER — MAGNESIUM SULFATE 40 GM/1000ML IV SOLN
2.0000 g/h | INTRAVENOUS | Status: AC
  Administered 2021-06-23 – 2021-06-24 (×2): 2 g/h via INTRAVENOUS
  Filled 2021-06-23: qty 1000

## 2021-06-23 MED ORDER — MAGNESIUM SULFATE BOLUS VIA INFUSION
4.0000 g | Freq: Once | INTRAVENOUS | Status: AC
  Administered 2021-06-23: 4 g via INTRAVENOUS
  Filled 2021-06-23: qty 1000

## 2021-06-23 MED ORDER — CYCLOBENZAPRINE HCL 5 MG PO TABS
10.0000 mg | ORAL_TABLET | Freq: Once | ORAL | Status: AC
  Administered 2021-06-23: 10 mg via ORAL
  Filled 2021-06-23: qty 2

## 2021-06-23 MED ORDER — PANTOPRAZOLE SODIUM 40 MG PO TBEC
40.0000 mg | DELAYED_RELEASE_TABLET | Freq: Every day | ORAL | Status: DC
  Administered 2021-06-24 – 2021-07-02 (×8): 40 mg via ORAL
  Filled 2021-06-23 (×8): qty 1

## 2021-06-23 MED ORDER — ACETAMINOPHEN 325 MG PO TABS
650.0000 mg | ORAL_TABLET | ORAL | Status: DC | PRN
  Administered 2021-06-26: 650 mg via ORAL
  Filled 2021-06-23: qty 2

## 2021-06-23 MED ORDER — LACTATED RINGERS IV SOLN: INTRAVENOUS | Status: DC

## 2021-06-23 MED ORDER — NIFEDIPINE 10 MG PO CAPS
20.0000 mg | ORAL_CAPSULE | Freq: Once | ORAL | Status: AC
  Administered 2021-06-23: 20 mg via ORAL
  Filled 2021-06-23: qty 2

## 2021-06-23 MED ORDER — MAGNESIUM SULFATE 40 GM/1000ML IV SOLN
INTRAVENOUS | Status: AC
  Filled 2021-06-23: qty 1000

## 2021-06-23 MED ORDER — INSULIN ASPART 100 UNIT/ML ~~LOC~~ SOLN
5.0000 [IU] | Freq: Three times a day (TID) | SUBCUTANEOUS | Status: DC
  Administered 2021-06-23 – 2021-06-25 (×6): 5 [IU] via SUBCUTANEOUS

## 2021-06-23 MED ORDER — ACETAMINOPHEN 500 MG PO TABS
1000.0000 mg | ORAL_TABLET | Freq: Once | ORAL | Status: AC
  Administered 2021-06-23: 1000 mg via ORAL
  Filled 2021-06-23: qty 2

## 2021-06-23 MED ORDER — ASPIRIN 81 MG PO CHEW
81.0000 mg | CHEWABLE_TABLET | Freq: Every day | ORAL | Status: DC
  Administered 2021-06-24 – 2021-06-28 (×5): 81 mg via ORAL
  Filled 2021-06-23 (×5): qty 1

## 2021-06-23 MED ORDER — CALCIUM CARBONATE ANTACID 500 MG PO CHEW: 2.0000 | CHEWABLE_TABLET | ORAL | Status: DC | PRN

## 2021-06-23 MED ORDER — ZOLPIDEM TARTRATE 5 MG PO TABS: 5.0000 mg | ORAL_TABLET | Freq: Every evening | ORAL | Status: DC | PRN

## 2021-06-23 MED ORDER — BETAMETHASONE SOD PHOS & ACET 6 (3-3) MG/ML IJ SUSP
12.0000 mg | INTRAMUSCULAR | Status: AC
  Administered 2021-06-23 – 2021-06-24 (×2): 12 mg via INTRAMUSCULAR
  Filled 2021-06-23: qty 5

## 2021-06-23 NOTE — Procedures (Signed)
Kristin Clay Kristin Clay Kristin Clay May 02, 1986 [redacted]w[redacted]d  Fetus A Non-Stress Test Interpretation for 06/23/21  Indication: T1DM, CHTN, AMA, IUGR, late to care  Fetal Heart Rate A Mode: External Baseline Rate (A): 145 bpm Variability: Moderate Accelerations: 10 x 10 Decelerations: None Multiple birth?: No  Uterine Activity Mode: Palpation, Toco Contraction Frequency (min): UI Contraction Quality: Mild Resting Tone Palpated: Relaxed Resting Time: Adequate  Interpretation (Fetal Testing) Nonstress Test Interpretation: Non-reactive Overall Impression: Reassuring for gestational age Comments: Dr. Parke Poisson reviewed tracing.

## 2021-06-23 NOTE — H&P (Addendum)
FACULTY PRACTICE ANTEPARTUM ADMISSION HISTORY AND PHYSICAL NOTE   History of Present Illness: Kristin Clay is a 35 y.o. G2P0010 at 6w0dadmitted for chronic hypertension with superimposed preeclampsia with severe features. She was sent to MAU by MFM due to severe range BPs today. She was diagnosed with chronic hypertension during a hospital admission in 10/2020. She was diagnosed with preeclampsia last month due to new onset proteinuria. She is currently taking labetalol 200 mg BID & hasn't missed doses. Presented to MAU with a 10/10 headache that was treated successfully with oral medications. Denies any symptoms.  She is type I diabetic, has not had her insulin today.  Does have a history of DKA.  Also reports decreased fetal movement for the last month and had not felt baby move at all in the last 2 days.  Started to feel baby move somewhat in MAU.  Previously diagnosed with IUGR, last growth was 9th percentile.  Patient reports the fetal movement as decreased . Patient reports uterine contraction  activity as none. Patient reports  vaginal bleeding as none. Patient describes fluid per vagina as None. Fetal presentation is  unsure, MFM ultrasound results pending from today .  Patient Active Problem List   Diagnosis Date Noted   Poor fetal growth complicating pregnancy, antepartum, third trimester, other fetus 06/19/2021   GBS bacteriuria 06/09/2021   Decreased fetal movements in third trimester 06/05/2021   UTI (urinary tract infection) during pregnancy 05/25/2021   Chronic hypertension affecting pregnancy 05/20/2021   Supervision of high risk pregnancy, antepartum 05/01/2021   Rh negative status during pregnancy 05/01/2021   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, second trimester 05/01/2021   History of diabetic ketoacidosis 05/01/2021   Late prenatal care affecting pregnancy in second trimester 05/01/2021   Type 1 diabetes mellitus with ketoacidosis,  uncontrolled ([place] 02/22/2020   Language barrier 01/11/2020   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    Past Medical History:  Diagnosis Date   Chronic hypertension    Complication of anesthesia    with hand surgery, local anes did not work, tried twice- had to put her to sleep   Diabetes mellitus type 1 ([place]    DKA (diabetic [place]acidosis) ([place] 10/2020   Infection    UTI   Pyelonephritis 10/2020   Type I diabetes mellitus [place]    dx [place]    Past Surgical History:  Procedure Laterality Date   female circumcision Bilateral    clitorectomy as well   FOOT SURGERY  2018   HAND SURGERY  2019    OB History  Gravida Para Term Preterm AB Living  2 0 0 0 1    SAB IAB Ectopic Multiple Live Births  1 0 0        # Outcome Date GA Lbr Len/2nd Weight Sex Delivery Anes PTL Lv  2 Current           1 SAB 12/2019            Social History   Socioeconomic History   Marital status: Married    Spouse name: Not on file   Number of children: Not on file   Years of education: Not on file   Highest education level: Not on file  Occupational History   Not on file  Tobacco Use   Smoking status: Never   Smokeless tobacco: Never  Vaping Use   Vaping Use: Never used  Substance and Sexual Activity   Alcohol use: Never   Drug  use: Never   Sexual activity: Yes    Birth control/protection: None  Other Topics Concern   Not on file  Social History Narrative   ** Merged History Encounter **       ** Merged History Encounter **       Social Determinants of Health   Financial Resource Strain: High Risk   Difficulty of Paying Living Expenses: Very hard  Food Insecurity: No Food Insecurity   Worried About Charity fundraiser in the Last Year: Never true   Arboriculturist in the Last Year: Never true  Transportation Needs: No Transportation Needs   Lack of Transportation (Medical): No   Lack of Transportation (Non-Medical): No  Physical Activity: Not on file  Stress: Not on file   Social Connections: Not on file    Family History  Problem Relation Age of Onset   Hyperlipidemia Mother    Hypertension Mother    Kidney disease Father    Alzheimer's disease Father     Allergies  Allergen Reactions   Pork-Derived Products     Pt does not want any pork products    Medications Prior to Admission  Medication Sig Dispense Refill Last Dose   aspirin 81 MG chewable tablet Chew 1 tablet (81 mg total) by mouth daily. 90 tablet 1 06/22/2021   cyclobenzaprine (FLEXERIL) 5 MG tablet Take 1 tablet (5 mg total) by mouth at bedtime as needed for muscle spasms. 10 tablet 0 06/22/2021   insulin aspart (NOVOLOG) 100 UNIT/ML FlexPen Inject 5 Units into the skin 3 (three) times daily with meals. 15 mL 11 06/22/2021   insulin NPH Human (NOVOLIN N) 100 UNIT/ML injection Inject 0.12 mLs (12 Units total) into the skin 2 (two) times daily at 8 am and 10 pm. 10 mL 11 06/22/2021   labetalol (NORMODYNE) 100 MG tablet Take 2 tablets (200 mg total) by mouth 2 (two) times daily. 120 tablet 2 06/23/2021 at 0700   pantoprazole (PROTONIX) 40 MG tablet Take 1 tablet (40 mg total) by mouth daily. 30 tablet 2 06/22/2021   blood glucose meter kit and supplies Dispense based on patient and insurance preference. Use up to four times daily as directed. (FOR ICD-10 E10.9, E11.9). 1 each 0    glucose blood test strip Use as instructed QID 100 each 12    Insulin Pen Needle (ULTRA FLO INSULIN PEN NEEDLES) 32G X 4 MM MISC 1 Device by Does not apply route 3 (three) times daily. 100 each 4    Insulin Syringe-Needle U-100 30G X 15/64" 0.5 ML MISC 1 Device by Does not apply route 2 (two) times daily. 100 each 5    OneTouch Delica Lancets 24P MISC 1 Device by Does not apply route in the morning, at noon, in the evening, and at bedtime. 100 each 2    Prenatal Vit-Fe Fumarate-FA (PRENATAL VITAMIN) 27-0.8 MG TABS Take 1 tablet by mouth daily. (Patient not taking: Reported on 06/19/2021) 90 tablet 2     Review of Systems -  Negative except headache & decreased fetal movement  Vitals:  BP 109/68   Pulse 96   Temp 98.3 F (36.8 C) (Oral)   Resp 15   LMP 10/22/2020   SpO2 100%  Patient Vitals for the past 24 hrs:  BP Temp Temp src Pulse Resp SpO2  06/23/21 1330 109/68 -- -- 96 -- 100 %  06/23/21 1315 111/69 -- -- 94 -- --  06/23/21 1300 (!) 158/92 -- -- 83 -- --  06/23/21 1245 (!) 168/96 -- -- 79 -- 100 %  06/23/21 1230 (!) 162/87 -- -- 83 -- 100 %  06/23/21 1215 (!) 163/93 -- -- 85 -- --  06/23/21 1200 (!) 154/79 -- -- 76 -- --  06/23/21 1145 (!) 146/78 -- -- 78 -- 100 %  06/23/21 1130 (!) 153/79 -- -- 81 -- 100 %  06/23/21 1117 (!) 147/86 -- -- 79 -- --  06/23/21 1115 (!) 173/97 -- -- 86 -- 100 %  06/23/21 1100 (!) 156/101 -- -- 88 -- --  06/23/21 1046 (!) 145/85 -- -- 86 -- --  06/23/21 1030 122/87 -- -- 100 -- 100 %  06/23/21 1014 127/82 98.3 F (36.8 C) Oral 99 15 --    Physical Examination: CONSTITUTIONAL: Well-developed, well-nourished female in no acute distress.  HENT:  Normocephalic, atraumatic, External right and left ear normal. Oropharynx is clear and moist EYES: Conjunctivae and EOM are normal. Pupils are equal, round, and reactive to light. No scleral icterus.  NECK: Normal range of motion, supple, no masses SKIN: Skin is warm and dry. No rash noted. Not diaphoretic. No erythema. No pallor. [place]: Alert and oriented to person, place, and time. Normal reflexes, muscle tone coordination. No cranial nerve deficit noted. PSYCHIATRIC: Normal mood and affect. Normal behavior. Normal judgment and thought content. CARDIOVASCULAR: Normal heart rate noted, regular rhythm RESPIRATORY: Effort and breath sounds normal, no problems with respiration noted ABDOMEN: Soft, nontender, nondistended, gravid. MUSCULOSKELETAL: Normal range of motion. No edema and no tenderness. 2+ distal pulses.  Cervix:  exam not indicated Membranes:intact Fetal Monitoring:Baseline: 135 bpm, Variability: Good {> 6  bpm), Accelerations: Reactive, and Decelerations: Absent Tocometer: Flat  Labs:  Results for orders placed or performed during the hospital encounter of 06/23/21 (from the past 24 hour(s))  Urinalysis, Routine w reflex microscopic Urine, Clean Catch   Collection Time: 06/23/21 10:23 AM  Result Value Ref Range   Color, Urine AMBER (A) YELLOW   APPearance CLOUDY (A) CLEAR   Specific Gravity, Urine 1.019 1.005 - 1.030   pH 6.0 5.0 - 8.0   Glucose, UA 150 (A) NEGATIVE mg/dL   Hgb urine dipstick NEGATIVE NEGATIVE   Bilirubin Urine NEGATIVE NEGATIVE   Ketones, ur 5 (A) NEGATIVE mg/dL   Protein, ur 100 (A) NEGATIVE mg/dL   Nitrite NEGATIVE NEGATIVE   Leukocytes,Ua NEGATIVE NEGATIVE   RBC / HPF 0-5 0 - 5 RBC/hpf   WBC, UA 11-20 0 - 5 WBC/hpf   Bacteria, UA RARE (A) NONE SEEN   Squamous Epithelial / LPF 11-20 0 - 5   Mucus PRESENT   Glucose, capillary   Collection Time: 06/23/21 10:24 AM  Result Value Ref Range   Glucose-Capillary 164 (H) 70 - 99 mg/dL  CBC   Collection Time: 06/23/21 11:21 AM  Result Value Ref Range   WBC 3.6 (L) 4.0 - 10.5 K/uL   RBC 4.58 3.87 - 5.11 MIL/uL   Hemoglobin 13.0 12.0 - 15.0 g/dL   HCT 39.9 36.0 - 46.0 %   MCV 87.1 80.0 - 100.0 fL   MCH 28.4 26.0 - 34.0 pg   MCHC 32.6 30.0 - 36.0 g/dL   RDW 14.3 11.5 - 15.5 %   Platelets 249 150 - 400 K/uL   nRBC 0.0 0.0 - 0.2 %  Comprehensive metabolic panel   Collection Time: 06/23/21 11:21 AM  Result Value Ref Range   Sodium 136 135 - 145 mmol/L   Potassium 3.3 (L) 3.5 - 5.1 mmol/L  Chloride 108 98 - 111 mmol/L   CO2 21 (L) 22 - 32 mmol/L   Glucose, Bld 151 (H) 70 - 99 mg/dL   BUN <5 (L) 6 - 20 mg/dL   Creatinine, Ser 0.34 (L) 0.44 - 1.00 mg/dL   Calcium 8.2 (L) 8.9 - 10.3 mg/dL   Total Protein 6.1 (L) 6.5 - 8.1 g/dL   Albumin 2.5 (L) 3.5 - 5.0 g/dL   AST 12 (L) 15 - 41 U/L   ALT 9 0 - 44 U/L   Alkaline Phosphatase 90 38 - 126 U/L   Total Bilirubin 0.6 0.3 - 1.2 mg/dL   GFR, Estimated >60 >60 mL/min    Anion gap 7 5 - 15    Imaging Studies: No results found.   Assessment and Plan: 1. Chronic hypertension with superimposed preeclampsia   2. 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, second trimester   3. [redacted] weeks gestation of pregnancy   -admit to [facility] unit -Mag sulfate 4/2 for preeclampsia -BMZ  - will monitor blood sugar closely  Jorje Guild, NP 06/23/2021 1:35 PM   OB Attending Pt seen and examined. IUP 33 weeks CHTN with SPEC by severe BP Will admit for Magnesium and celestone Continue with current insulin regiment, adjust accordingly due to celestone NICU consult Delivery at 34 weeks or for maternal/fetal indications POC reviewed with pt.  Arlina Robes, MD

## 2021-06-23 NOTE — Progress Notes (Signed)
MFM Note  Kristin Clay was seen for a biophysical profile due to chronic hypertension treated with labetalol and pregestational diabetes treated with insulin.  She was diagnosed with preeclampsia few weeks ago when her P/C ratio indicated new onset proteinuria.  IUGR has also been noted in her current pregnancy.  Her last growth scan two weeks ago showed an EFW of 3 pounds 3 ounces (9th percentile for her gestational age).  The patient reports that she has experienced a headache and upper abdominal pain for the past 3 days.  Her blood pressures in our office today were 163/92 and 162/95.  A biophysical profile performed today was 8 out of 8.  She also had a nonreactive nonstress test making her total biophysical profile score 8 out of 10.  There was normal amniotic fluid noted today.   Doppler studies of the umbilical arteries performed today showed a normal S/D ratio of 3.06.  There were no signs of absent or reversed diastolic flow.  Due to her elevated blood pressures and symptoms along with the fact that she has been diagnosed with preeclampsia, the patient was sent to the hospital for admission to receive a complete course of antenatal corticosteroids and observation/delivery.  Due to preeclampsia with fetal growth restriction, I would recommend inpatient management until delivery.  She may continue the antihypertensive medications (labetalol) for blood pressure control.   As she is complaining of a headache,I would recommend that she be continued on magnesium sulfate for maternal seizure prophylaxis until her antenatal corticosteroid course is completed.  The goal for her delivery would be 34 weeks.    Delivery prior to 34 weeks would be recommended if her blood pressures remain persistently greater than 160/100 despite medication treatment, should she require IV push antihypertensive medication for blood pressure control, should she complain of a persistent severe headache, should her  liver function tests increase or platelet counts decrease, or should there be nonreassuring fetal status.  All conversations were held with the patient today with the help of an Arabic interpreter.  Recommendations:  Inpatient management until delivery Continue magnesium sulfate for maternal seizure prophylaxis until her antenatal corticosteroid course is completed Continue labetalol for blood pressure control  Delivery at 34 weeks Delivery prior to 34 weeks would be indicated:   Should her blood pressures be persistently greater than 160/100 despite treatment  Should she require IV push medication for blood pressure control  Should she complain of any signs and symptoms of severe preeclampsia  Should her PIH labs be abnormal  At any time for nonreassuring fetal status  A total of 30 minutes was spent counseling and coordinating the care for this patient.

## 2021-06-23 NOTE — MAU Note (Signed)
.  Kristin Clay is a 35 y.o. at [redacted]w[redacted]d here in MAU reporting: sent from MFM for severe range BP. Also has not felt her baby move x2 days. Having a headache she rates 7/10, has not taken any medication for relief. Took her labetalol this morning at 0700. Denies VB or LOF. Type 1 diabetic, has not checked her glucose today.   Pain score: 7 Vitals:   06/23/21 1014  BP: 127/82  Pulse: 99  Resp: 15  Temp: 98.3 F (36.8 C)     FHT:138 Lab orders placed from triage:  UA

## 2021-06-23 NOTE — Progress Notes (Signed)
Patient C/O HA, sharp epigastric pain, decreased FM. NST reassuring, but nonreactive. Patient sent to MAU by Dr. Parke Poisson for further evaluation. MAU notified.

## 2021-06-24 ENCOUNTER — Encounter (HOSPITAL_COMMUNITY): Payer: Self-pay | Admitting: Obstetrics and Gynecology

## 2021-06-24 DIAGNOSIS — O24012 Pre-existing diabetes mellitus, type 1, in pregnancy, second trimester: Secondary | ICD-10-CM

## 2021-06-24 DIAGNOSIS — O365990 Maternal care for other known or suspected poor fetal growth, unspecified trimester, not applicable or unspecified: Secondary | ICD-10-CM

## 2021-06-24 LAB — GLUCOSE, CAPILLARY
Glucose-Capillary: 189 mg/dL — ABNORMAL HIGH (ref 70–99)
Glucose-Capillary: 156 mg/dL — ABNORMAL HIGH (ref 70–99)
Glucose-Capillary: 190 mg/dL — ABNORMAL HIGH (ref 70–99)
Glucose-Capillary: 173 mg/dL — ABNORMAL HIGH (ref 70–99)
Glucose-Capillary: 175 mg/dL — ABNORMAL HIGH (ref 70–99)
Glucose-Capillary: 155 mg/dL — ABNORMAL HIGH (ref 70–99)

## 2021-06-24 MED ORDER — LACTATED RINGERS IV BOLUS
500.0000 mL | Freq: Once | INTRAVENOUS | Status: AC
  Administered 2021-06-24: 500 mL via INTRAVENOUS

## 2021-06-24 MED ORDER — INSULIN ASPART 100 UNIT/ML IJ SOLN
0.0000 [IU] | Freq: Three times a day (TID) | INTRAMUSCULAR | Status: DC
  Administered 2021-06-24 – 2021-06-25 (×5): 3 [IU] via SUBCUTANEOUS
  Administered 2021-06-26: 1 [IU] via SUBCUTANEOUS

## 2021-06-24 NOTE — Progress Notes (Signed)
Patient ID: Kristin Clay, female   DOB: 06-11-1986, 35 y.o.   MRN: [id number] ACULTY PRACTICE ANTEPARTUM COMPREHENSIVE PROGRESS NOTE  Kristin Clay is a 35 y.o. G2P0010 at [redacted]w[redacted]d  who is admitted for [facility] with SPEC.   Fetal presentation is breech. Length of Stay:  1  Days  Subjective: Denies HA or visual changes this morning Patient reports good fetal movement.  She reports no uterine contractions, no bleeding and no loss of fluid per vagina.  Vitals:  Blood pressure 119/79, pulse 89, temperature 97.7 F (36.5 C), temperature source Oral, resp. rate 16, last menstrual period 10/22/2020, SpO2 100 %, unknown if currently breastfeeding. Physical Examination: Lungs clear Heart RRR Abd soft, + BS, gravid Ext non tender  Fetal Monitoring:  Baseline: 120 bpm, + accels, no ut ctx  Labs:  Results for orders placed or performed during the hospital encounter of 06/23/21 (from the past 24 hour(s))  Urinalysis, Routine w reflex microscopic Urine, Clean Catch   Collection Time: 06/23/21 10:23 AM  Result Value Ref Range   Color, Urine AMBER (A) YELLOW   APPearance CLOUDY (A) CLEAR   Specific Gravity, Urine 1.019 1.005 - 1.030   pH 6.0 5.0 - 8.0   Glucose, UA 150 (A) NEGATIVE mg/dL   Hgb urine dipstick NEGATIVE NEGATIVE   Bilirubin Urine NEGATIVE NEGATIVE   Ketones, ur 5 (A) NEGATIVE mg/dL   Protein, ur 509 (A) NEGATIVE mg/dL   Nitrite NEGATIVE NEGATIVE   Leukocytes,Ua NEGATIVE NEGATIVE   RBC / HPF 0-5 0 - 5 RBC/hpf   WBC, UA 11-20 0 - 5 WBC/hpf   Bacteria, UA RARE (A) NONE SEEN   Squamous Epithelial / LPF 11-20 0 - 5   Mucus PRESENT   Glucose, capillary   Collection Time: 06/23/21 10:24 AM  Result Value Ref Range   Glucose-Capillary 164 (H) 70 - 99 mg/dL  CBC   Collection Time: 06/23/21 11:21 AM  Result Value Ref Range   WBC 3.6 (L) 4.0 - 10.5 K/uL   RBC 4.58 3.87 - 5.11 MIL/uL   Hemoglobin 13.0 12.0 - 15.0 g/dL   HCT 32.6 71.2 - 45.8 %   MCV 87.1 80.0 -  100.0 fL   MCH 28.4 26.0 - 34.0 pg   MCHC 32.6 30.0 - 36.0 g/dL   RDW 09.9 83.3 - 82.5 %   Platelets 249 150 - 400 K/uL   nRBC 0.0 0.0 - 0.2 %  Comprehensive metabolic panel   Collection Time: 06/23/21 11:21 AM  Result Value Ref Range   Sodium 136 135 - 145 mmol/L   Potassium 3.3 (L) 3.5 - 5.1 mmol/L   Chloride 108 98 - 111 mmol/L   CO2 21 (L) 22 - 32 mmol/L   Glucose, Bld 151 (H) 70 - 99 mg/dL   BUN <5 (L) 6 - 20 mg/dL   Creatinine, Ser 0.53 (L) 0.44 - 1.00 mg/dL   Calcium 8.2 (L) 8.9 - 10.3 mg/dL   Total Protein 6.1 (L) 6.5 - 8.1 g/dL   Albumin 2.5 (L) 3.5 - 5.0 g/dL   AST 12 (L) 15 - 41 U/L   ALT 9 0 - 44 U/L   Alkaline Phosphatase 90 38 - 126 U/L   Total Bilirubin 0.6 0.3 - 1.2 mg/dL   GFR, Estimated >97 >67 mL/min   Anion gap 7 5 - 15  Resp Panel by RT-PCR (Flu A&B, Covid) Nasopharyngeal Swab   Collection Time: 06/23/21  2:00 PM   Specimen: Nasopharyngeal Swab;  Nasopharyngeal(NP) swabs in vial transport medium  Result Value Ref Range   SARS Coronavirus 2 by RT PCR NEGATIVE NEGATIVE   Influenza A by PCR NEGATIVE NEGATIVE   Influenza B by PCR NEGATIVE NEGATIVE  Glucose, capillary   Collection Time: 06/23/21  5:43 PM  Result Value Ref Range   Glucose-Capillary 66 (L) 70 - 99 mg/dL  Glucose, capillary   Collection Time: 06/23/21  6:00 PM  Result Value Ref Range   Glucose-Capillary 68 (L) 70 - 99 mg/dL  Type and screen [facility]   Collection Time: 06/23/21  8:15 PM  Result Value Ref Range   ABO/RH(D) A NEG    Antibody Screen POS    Sample Expiration 06/26/2021,2359    Antibody Identification      PASSIVELY ACQUIRED ANTI-D Performed at [facility], [address], [place], [place] [address]   Glucose, capillary   Collection Time: 06/23/21 10:06 PM  Result Value Ref Range   Glucose-Capillary 184 (H) 70 - 99 mg/dL  Glucose, capillary   Collection Time: 06/24/21  5:11 AM  Result Value Ref Range   Glucose-Capillary 189 (H) 70 - 99 mg/dL   Glucose, capillary   Collection Time: 06/24/21  8:09 AM  Result Value Ref Range   Glucose-Capillary 156 (H) 70 - 99 mg/dL   Comment 1 Notify RN    Comment 2 Document in Chart     Imaging Studies:    Growth 9 % on 7/18   Medications:  Scheduled  aspirin  81 mg Oral Daily   betamethasone acetate-betamethasone sodium phosphate  12 mg Intramuscular Q24H   docusate sodium  100 mg Oral Daily   insulin aspart  5 Units Subcutaneous TID WC   insulin NPH Human  12 Units Subcutaneous BID AC & HS   labetalol  200 mg Oral BID   pantoprazole  40 mg Oral Daily   prenatal multivitamin  1 tablet Oral Q1200   I have reviewed the patient's current medications.  ASSESSMENT: IUP 33 1/7 weeks CHTN with SPEC by BP DM, Type 1 Malpresentation IUGR  PLAN: BP stable. Continue with Labetalol. Will complete 24 hrs magnesium today. BMZ x 2. CBG's stable. Continue with current insulin regiment. Consented for c section due to malpresentation. Delivery at 34 weeks or for worsen SPEC or fetal indications. Fetal well being reassuring at present.  Continue routine antenatal care.  The risks of cesarean section discussed with the patient included but were not limited to: bleeding which may require transfusion or reoperation; infection which may require antibiotics; injury to bowel, bladder, ureters or other surrounding organs; injury to the fetus; need for additional procedures including hysterectomy in the event of a life-threatening hemorrhage; placental abnormalities wth subsequent pregnancies, incisional problems, thromboembolic phenomenon and other postoperative/anesthesia complications. The patient concurred with the proposed plan, giving informed written consent for the procedure.     Hermina Staggers 06/24/2021,9:45 AM

## 2021-06-24 NOTE — Progress Notes (Signed)
Ordered pt food tray.

## 2021-06-24 NOTE — Progress Notes (Signed)
Fasting glucose to be taken this morning. RN did a CBG with a result of 189. Though, Patient had eaten nutrition bag overnight. Raelyn Ensign, RN

## 2021-06-24 NOTE — Progress Notes (Signed)
Inpatient Diabetes Program Recommendations  AACE/ADA: New Consensus Statement on Inpatient Glycemic Control (2015)  Target Ranges:  Prepandial:   less than 140 mg/dL      Peak postprandial:   less than 180 mg/dL (1-2 hours)      Critically ill patients:  140 - 180 mg/dL   Lab Results  Component Value Date   GLUCAP 190 (H) 06/24/2021   HGBA1C 8.4 (H) 05/01/2021    Review of Glycemic Control Results for BHUMI, GODBEY (MRN [id number]) as of 06/24/2021 11:18  Ref. Range 06/23/2021 22:06 06/24/2021 05:11 06/24/2021 08:09 06/24/2021 10:09  Glucose-Capillary Latest Ref Range: 70 - 99 mg/dL 932 (H) 671 (H) 245 (H) 190 (H)   Diabetes history: Type 1 DM Outpatient Diabetes medications: NPH 12 units BID, Novolog 5 units TID Current orders for Inpatient glycemic control: NPH 12 units BID, Novolog 5 units TID BMZ x 1 (2)  Inpatient Diabetes Program Recommendations:    Consider: -adding Novolog 0-14 units TID & HS (under Diabetes Treatment in Pregnancy order set) - In the setting of steroids, increase Novolog to 7 units TID (Assuming patient is consuming >50% of meals)  -When in transferred to L&D would consider IV insulin per Endotool  Thanks, Lujean Rave, MSN, RNC-OB Diabetes Coordinator [phone] (8a-5p)

## 2021-06-25 DIAGNOSIS — O365939 Maternal care for other known or suspected poor fetal growth, third trimester, other fetus: Secondary | ICD-10-CM | POA: Diagnosis not present

## 2021-06-25 DIAGNOSIS — Z3A33 33 weeks gestation of pregnancy: Secondary | ICD-10-CM

## 2021-06-25 DIAGNOSIS — O24013 Pre-existing diabetes mellitus, type 1, in pregnancy, third trimester: Secondary | ICD-10-CM

## 2021-06-25 DIAGNOSIS — O119 Pre-existing hypertension with pre-eclampsia, unspecified trimester: Secondary | ICD-10-CM | POA: Diagnosis not present

## 2021-06-25 LAB — COMPREHENSIVE METABOLIC PANEL
ALT: 9 U/L (ref 0–44)
AST: 12 U/L — ABNORMAL LOW (ref 15–41)
Albumin: 2.4 g/dL — ABNORMAL LOW (ref 3.5–5.0)
Alkaline Phosphatase: 78 U/L (ref 38–126)
Anion gap: 12 (ref 5–15)
BUN: 8 mg/dL (ref 6–20)
CO2: 19 mmol/L — ABNORMAL LOW (ref 22–32)
Calcium: 8 mg/dL — ABNORMAL LOW (ref 8.9–10.3)
Chloride: 104 mmol/L (ref 98–111)
Creatinine, Ser: 0.37 mg/dL — ABNORMAL LOW (ref 0.44–1.00)
GFR, Estimated: >60 mL/min (ref >60)
Glucose, Bld: 185 mg/dL — ABNORMAL HIGH (ref 70–99)
Potassium: 3.5 mmol/L (ref 3.5–5.1)
Sodium: 135 mmol/L (ref 135–145)
Total Bilirubin: 0.7 mg/dL (ref 0.3–1.2)
Total Protein: 5.7 g/dL — ABNORMAL LOW (ref 6.5–8.1)

## 2021-06-25 LAB — CBC
HCT: 36 % (ref 36.0–46.0)
Hemoglobin: 12.2 g/dL (ref 12.0–15.0)
MCH: 29.3 pg (ref 26.0–34.0)
MCHC: 33.9 g/dL (ref 30.0–36.0)
MCV: 86.3 fL (ref 80.0–100.0)
Platelets: 236 10*3/uL (ref 150–400)
RBC: 4.17 MIL/uL (ref 3.87–5.11)
RDW: 14.5 % (ref 11.5–15.5)
WBC: 5.7 10*3/uL (ref 4.0–10.5)
nRBC: 0 % (ref 0.0–0.2)

## 2021-06-25 LAB — GLUCOSE, CAPILLARY
Glucose-Capillary: 143 mg/dL — ABNORMAL HIGH (ref 70–99)
Glucose-Capillary: 152 mg/dL — ABNORMAL HIGH (ref 70–99)
Glucose-Capillary: 198 mg/dL — ABNORMAL HIGH (ref 70–99)
Glucose-Capillary: 178 mg/dL — ABNORMAL HIGH (ref 70–99)

## 2021-06-25 LAB — CULTURE, BETA STREP (GROUP B ONLY)

## 2021-06-25 MED ORDER — INSULIN ASPART 100 UNIT/ML ~~LOC~~ SOLN
7.0000 [IU] | Freq: Three times a day (TID) | SUBCUTANEOUS | Status: DC
  Administered 2021-06-25 – 2021-06-26 (×2): 7 [IU] via SUBCUTANEOUS

## 2021-06-25 MED ORDER — LABETALOL HCL 200 MG PO TABS
400.0000 mg | ORAL_TABLET | Freq: Two times a day (BID) | ORAL | Status: DC
  Administered 2021-06-25 (×2): 400 mg via ORAL
  Filled 2021-06-25 (×2): qty 2

## 2021-06-25 MED ORDER — HYDRALAZINE HCL 20 MG/ML IJ SOLN
10.0000 mg | INTRAMUSCULAR | Status: DC | PRN
  Administered 2021-06-26: 10 mg via INTRAVENOUS
  Filled 2021-06-25: qty 1

## 2021-06-25 MED ORDER — INSULIN ASPART 100 UNIT/ML ~~LOC~~ SOLN
6.0000 [IU] | Freq: Three times a day (TID) | SUBCUTANEOUS | Status: DC
  Filled 2021-06-25: qty 0.06

## 2021-06-25 MED ORDER — LABETALOL HCL 5 MG/ML IV SOLN: 40.0000 mg | INTRAVENOUS | Status: DC | PRN

## 2021-06-25 MED ORDER — LABETALOL HCL 5 MG/ML IV SOLN: 20.0000 mg | INTRAVENOUS | Status: DC | PRN

## 2021-06-25 MED ORDER — HYDRALAZINE HCL 20 MG/ML IJ SOLN
5.0000 mg | INTRAMUSCULAR | Status: DC | PRN
  Administered 2021-06-25 – 2021-06-27 (×2): 5 mg via INTRAVENOUS
  Filled 2021-06-25 (×2): qty 1

## 2021-06-25 NOTE — Progress Notes (Signed)
FACULTY PRACTICE ANTEPARTUM(COMPREHENSIVE) NOTE  Kristin Clay is a 35 y.o. G2P0010 with Estimated Date of Delivery: 08/11/21   By  best clinical estimate [redacted]w[redacted]d  who is admitted for preeclampsia with severe features.    Fetal presentation is breech. Length of Stay:  2  Days  Date of admission:06/23/2021  Subjective: Pt resting comfortably, no acute complaints Patient reports the fetal movement as active. Patient reports uterine contraction  activity as none. Patient reports  vaginal bleeding as none. Patient describes fluid per vagina as None.  Vitals:  Blood pressure (!) 150/80, pulse 97, temperature 98.7 F (37.1 C), temperature source Oral, resp. rate 16, height 5' (1.524 m), weight 49.7 kg, last menstrual period 10/22/2020, SpO2 100 %, unknown if currently breastfeeding. Vitals:   06/25/21 0313 06/25/21 0319 06/25/21 0741 06/25/21 1118  BP: (!) 152/110 (!) 148/87 (!) 162/89 (!) 150/80  Pulse: 87 75 85 97  Resp: Temp: 98.1 F (36.7 C)  98.1 F (36.7 C) 98.7 F (37.1 C)  TempSrc: Oral  Oral Oral  SpO2: 100% 100% 100% 100%  Weight:      Height:       Physical Examination:  General appearance - alert, well appearing, and in no distress Mental status - normal mood, behavior, speech, dress, motor activity, and thought processes Chest - clear to auscultation, no wheezes, rales or rhonchi, symmetric air entry Heart - normal rate and regular rhythm Abdomen - gravid, soft and non-tender Extremities - no calf tenderness Skin - warm and dry   Fetal Monitoring:  Baseline: 120 bpm, Variability: moderate, Accelerations: +15x15 accels x2, and Decelerations: Absent    reactive  Labs:  Results for orders placed or performed during the hospital encounter of 06/23/21 (from the past 24 hour(s))  Glucose, capillary   Collection Time: 06/24/21  4:11 PM  Result Value Ref Range   Glucose-Capillary 173 (H) 70 - 99 mg/dL  Culture, beta strep (group b only)    Collection Time: 06/24/21  4:32 PM   Specimen: Vaginal/Rectal; Genital  Result Value Ref Range   Specimen Description VAGINAL/RECTAL    Special Requests NONE    Culture (A)     GROUP B STREP(S.AGALACTIAE)ISOLATED TESTING AGAINST S. AGALACTIAE NOT ROUTINELY PERFORMED DUE TO PREDICTABILITY OF AMP/PEN/VAN SUSCEPTIBILITY. Performed at [facility], [address], [place], [place] [address]    Report Status 06/25/2021 FINAL   Glucose, capillary   Collection Time: 06/24/21  7:53 PM  Result Value Ref Range   Glucose-Capillary 175 (H) 70 - 99 mg/dL   Comment 1 Notify RN   Glucose, capillary   Collection Time: 06/24/21  9:28 PM  Result Value Ref Range   Glucose-Capillary 155 (H) 70 - 99 mg/dL  CBC   Collection Time: 06/25/21  5:24 AM  Result Value Ref Range   WBC 5.7 4.0 - 10.5 K/uL   RBC 4.17 3.87 - 5.11 MIL/uL   Hemoglobin 12.2 12.0 - 15.0 g/dL   HCT 54.0 98.1 - 19.1 %   MCV 86.3 80.0 - 100.0 fL   MCH 29.3 26.0 - 34.0 pg   MCHC 33.9 30.0 - 36.0 g/dL   RDW 47.8 29.5 - 62.1 %   Platelets 236 150 - 400 K/uL   nRBC 0.0 0.0 - 0.2 %  Comprehensive metabolic panel   Collection Time: 06/25/21  5:24 AM  Result Value Ref Range   Sodium 135 135 - 145 mmol/L   Potassium 3.5 3.5 - 5.1 mmol/L  Chloride 104 98 - 111 mmol/L   CO2 19 (L) 22 - 32 mmol/L   Glucose, Bld 185 (H) 70 - 99 mg/dL   BUN 8 6 - 20 mg/dL   Creatinine, Ser 9.79 (L) 0.44 - 1.00 mg/dL   Calcium 8.0 (L) 8.9 - 10.3 mg/dL   Total Protein 5.7 (L) 6.5 - 8.1 g/dL   Albumin 2.4 (L) 3.5 - 5.0 g/dL   AST 12 (L) 15 - 41 U/L   ALT 9 0 - 44 U/L   Alkaline Phosphatase 78 38 - 126 U/L   Total Bilirubin 0.7 0.3 - 1.2 mg/dL   GFR, Estimated >48 >01 mL/min   Anion gap 12 5 - 15  Glucose, capillary   Collection Time: 06/25/21  8:05 AM  Result Value Ref Range   Glucose-Capillary 143 (H) 70 - 99 mg/dL  Glucose, capillary   Collection Time: 06/25/21 11:33 AM  Result Value Ref Range   Glucose-Capillary 152 (H) 70 - 99 mg/dL     Imaging Studies:    [place] MFM FETAL BPP W/NONSTRESS  Result Date: 06/23/2021 ----------------------------------------------------------------------  OBSTETRICS REPORT                       (Signed Final 06/23/2021 05:49 pm) ---------------------------------------------------------------------- Patient Info  ID #:       [id number]                          D.O.B.:  [birth year] (35 yrs)  Name:       Kristin Clay                Visit Date: 06/23/2021 07:39 am              Kristin Clay ---------------------------------------------------------------------- Performed By  Attending:        Ma Rings MD         Secondary Phy.:   Lawernce Pitts CNM  Performed By:     Tommie Raymond BS,       Address:          [address] [place]                                                             [address]                                                             [place], [place]  [address]  Referred By:      [facility]          Location:         [facility]                                [facility]                                                             [facility]  Ref. Address:     [address]                    [place], [place]                    [address] ---------------------------------------------------------------------- Orders  #  Description                           Code        Ordered By  1  [place] MFM UA CORD DOPPLER                [id number]    RAVI SHANKAR  2  [place] MFM FETAL BPP                      [id number]     RAVI SHANKAR     W/NONSTRESS ----------------------------------------------------------------------  #  Order #                     Accession #                Episode #  1  [id number]                   [id number]                 [id number]  2  [id number]                    [id number]                 [id number] ---------------------------------------------------------------------- Indications  Maternal care for known or suspected poor      O36.5930  fetal growth, third trimester, not applicable or  unspecified IUGR  Advanced maternal age multigravida 27+,        O94.523  third trimester  Hypertension - Chronic with superimposed       O11.9 O10.919  preeclampsia (Labetalol)  Pre-existing diabetes, type 1, in pregnancy,   O24.013  third trimester  [redacted] weeks gestation of pregnancy                Z3A.33  Insufficient Prenatal Care (transferred from  O09.30  [place]) ---------------------------------------------------------------------- Fetal Evaluation  Num Of Fetuses:         1  Fetal Heart Rate(bpm):  131  Cardiac Activity:       Observed  Presentation:           Breech  Placenta:               Anterior  P. Cord Insertion:      Previously Visualized  Amniotic Fluid  AFI FV:      Within normal limits  AFI Sum(cm)     %Tile       Largest Pocket(cm)  19.8            74          7.3  RUQ(cm)       RLQ(cm)       LUQ(cm)        LLQ(cm)  2.1           6.4           7.3            4 ---------------------------------------------------------------------- Biophysical Evaluation  Amniotic F.V:   Pocket => 2 cm             F. Tone:        Observed  F. Movement:    Observed                   N.S.T:          Nonreactive  F. Breathing:   Observed                   Score:          8/10 ---------------------------------------------------------------------- OB History  Gravidity:    2         Term:   0        Prem:   0        SAB:   1  TOP:          0       Ectopic:  0        Living: 0 ---------------------------------------------------------------------- Gestational Age  LMP:           34w 6d        Date:  10/22/20                 EDD:   07/29/21  Best:          33w 0d     Det. By:  U/S  (04/11/21)          EDD:   08/11/21 ---------------------------------------------------------------------- Anatomy   Cranium:               Previously seen        Aortic Arch:            Previously seen  Cavum:                 Previously seen        Ductal Arch:            Previously seen  Ventricles:            Previously seen        Diaphragm:              Previously seen  Choroid Plexus:        Previously seen        Stomach:  Previously Seen  Cerebellum:            Previously seen        Abdomen:                Previously seen  Posterior Fossa:       Previously seen        Abdominal Wall:         Previously seen  Nuchal Fold:           Not applicable (>20    Cord Vessels:           Previously seen                         wks GA)  Face:                  Orbits and profile     Kidneys:                Previously seen                         previously seen  Lips:                  Previously seen        Bladder:                Previously seen  Thoracic:              Previously seen        Spine:                  Previously seen  Heart:                 Previously seen        Upper Extremities:      Previously seen  RVOT:                  Previously seen        Lower Extremities:      Previously seen  LVOT:                  Previously seen  Other:  Female gender previously visualized. Heels, Nasal bone, Right open          hand, and IVC/SVC prev. visualized. Technically difficult due to fetal          position. ---------------------------------------------------------------------- Doppler - Fetal Vessels  Umbilical Artery   S/D     %tile      RI    %tile      PI    %tile            ADFV    RDFV   3.06       76    0.67       78    1.05       80               No      No ---------------------------------------------------------------------- Cervix Uterus Adnexa  Cervix  Not visualized (advanced GA >24wks)  Uterus  No abnormality visualized.  Cul De Sac  No free fluid seen.  Adnexa  No abnormality visualized. ---------------------------------------------------------------------- Comments  Kristin Clay was seen for a  biophysical profile due to  chronic hypertension treated with labetalol and pregestational  diabetes treated with insulin.  She was diagnosed with  preeclampsia few weeks  ago when her P/C ratio indicated  new onset proteinuria.  IUGR has also been noted in her  current pregnancy.  Her last growth scan two weeks ago  showed an EFW of 3 pounds 3 ounces (9th percentile for her  gestational age).  The patient reports that she has experienced a headache and  upper abdominal pain for the past 3 days.  Her blood  pressures in our office today were 163/92 and 162/95.  A biophysical profile performed today was 8 out of 8.  She  also had a nonreactive nonstress test making her total  biophysical profile score 8 out of 10.  There was normal  amniotic fluid noted today.  Doppler studies of the umbilical arteries performed today  showed a normal S/D ratio of 3.06.  There were no signs of  absent or reversed diastolic flow.  Due to her elevated blood pressures and symptoms along  with the fact that she has been diagnosed with preeclampsia,  the patient was sent to the hospital for admission to receive a  complete course of antenatal corticosteroids and  observation/delivery.  Due to preeclampsia with fetal growth restriction, I would  recommend inpatient management until delivery.  She may  continue the antihypertensive medications (labetalol) for  blood pressure control.  As she is complaining of a headache,I would recommend that  she be continued on magnesium sulfate for maternal seizure  prophylaxis until her antenatal corticosteroid course is  completed.  The goal for her delivery would be 34 weeks.  Delivery prior to 34 weeks would be recommended if her  blood pressures remain persistently greater than 160/100  despite medication treatment, should she require IV push  antihypertensive medication for blood pressure control,  should she complain of a persistent severe headache, should  her liver function tests increase or  platelet counts decrease,  or should there be nonreassuring fetal status.  All conversations were held with the patient today with the  help of an Arabic interpreter.  Recommendations:  Inpatient management until delivery  Continue magnesium sulfate for maternal seizure prophylaxis  until her antenatal corticosteroid course is completed  Continue labetalol for blood pressure control  Delivery at 34 weeks  Delivery prior to 34 weeks would be indicated:           Should her blood pressures be persistently greater  than 160/100 despite treatment           Should she require IV push medication for blood  pressure control           Should she complain of any signs and symptoms of  severe preeclampsia           Should her PIH labs be abnormal           At any time for nonreassuring fetal status  A total of 30 minutes was spent counseling and coordinating  the care for this patient. ----------------------------------------------------------------------                   Ma Rings, MD Electronically Signed Final Report   06/23/2021 05:49 pm ----------------------------------------------------------------------  [place] MFM UA CORD DOPPLER  Result Date: 06/23/2021 ----------------------------------------------------------------------  OBSTETRICS REPORT                       (Signed Final 06/23/2021 05:49 pm) ---------------------------------------------------------------------- Patient Info  ID #:       [id number]  D.O.B.:  [birth year] (35 yrs)  Name:       Kristin Clay                Visit Date: 06/23/2021 07:39 am              Kristin Clay ---------------------------------------------------------------------- Performed By  Attending:        Ma Rings MD         Secondary Phy.:   Lawernce Pitts CNM  Performed By:     Tommie Raymond BS,       Address:          [address] [place]                                                              [address]                                                             [place], [place]                                                             [address]  Referred By:      [facility]          Location:         [facility]                                Fetal Care at                                                             [facility]                                                             Women  Ref. Address:     [address]  [place], [place]                    [address] ---------------------------------------------------------------------- Orders  #  Description                           Code        Ordered By  1  [place] MFM UA CORD DOPPLER                76820.02    RAVI SHANKAR  2  [place] MFM FETAL BPP                      91478.2     RAVI [facility]     W/NONSTRESS ----------------------------------------------------------------------  #  Order #                     Accession #                Episode #  1  [id number]                   [id number]                 [id number]  2  [id number]                   [id number]                 [id number] ---------------------------------------------------------------------- Indications  Maternal care for known or suspected poor      O36.5930  fetal growth, third trimester, not applicable or  unspecified IUGR  Advanced maternal age multigravida 75+,        O22.523  third trimester  Hypertension - Chronic with superimposed       O11.9 O10.919  preeclampsia (Labetalol)  Pre-existing diabetes, type 1, in pregnancy,   O24.013  third trimester  [redacted] weeks gestation of pregnancy                Z3A.33  Insufficient Prenatal Care (transferred from   O57.30  [place]) ---------------------------------------------------------------------- Fetal Evaluation  Num Of Fetuses:         1  Fetal Heart Rate(bpm):  131  Cardiac Activity:       Observed  Presentation:           Breech  Placenta:               Anterior  P. Cord Insertion:       Previously Visualized  Amniotic Fluid  AFI FV:      Within normal limits  AFI Sum(cm)     %Tile       Largest Pocket(cm)  19.8            74          7.3  RUQ(cm)       RLQ(cm)       LUQ(cm)        LLQ(cm)  2.1           6.4           7.3            4 ---------------------------------------------------------------------- Biophysical Evaluation  Amniotic F.V:   Pocket => 2 cm             F. Tone:        Observed  F. Movement:    Observed  N.S.T:          Nonreactive  F. Breathing:   Observed                   Score:          8/10 ---------------------------------------------------------------------- OB History  Gravidity:    2         Term:   0        Prem:   0        SAB:   1  TOP:          0       Ectopic:  0        Living: 0 ---------------------------------------------------------------------- Gestational Age  LMP:           34w 6d        Date:  10/22/20                 EDD:   07/29/21  Best:          33w 0d     Det. By:  U/S  (04/11/21)          EDD:   08/11/21 ---------------------------------------------------------------------- Anatomy  Cranium:               Previously seen        Aortic Arch:            Previously seen  Cavum:                 Previously seen        Ductal Arch:            Previously seen  Ventricles:            Previously seen        Diaphragm:              Previously seen  Choroid Plexus:        Previously seen        Stomach:                Previously Seen  Cerebellum:            Previously seen        Abdomen:                Previously seen  Posterior Fossa:       Previously seen        Abdominal Wall:         Previously seen  Nuchal Fold:           Not applicable (>20    Cord Vessels:           Previously seen                         wks GA)  Face:                  Orbits and profile     Kidneys:                Previously seen                         previously seen  Lips:                  Previously seen        Bladder:  Previously seen  Thoracic:               Previously seen        Spine:                  Previously seen  Heart:                 Previously seen        Upper Extremities:      Previously seen  RVOT:                  Previously seen        Lower Extremities:      Previously seen  LVOT:                  Previously seen  Other:  Female gender previously visualized. Heels, Nasal bone, Right open          hand, and IVC/SVC prev. visualized. Technically difficult due to fetal          position. ---------------------------------------------------------------------- Doppler - Fetal Vessels  Umbilical Artery   S/D     %tile      RI    %tile      PI    %tile            ADFV    RDFV   3.06       76    0.67       78    1.05       80               No      No ---------------------------------------------------------------------- Cervix Uterus Adnexa  Cervix  Not visualized (advanced GA >24wks)  Uterus  No abnormality visualized.  Cul De Sac  No free fluid seen.  Adnexa  No abnormality visualized. ---------------------------------------------------------------------- Comments  Kristin Clay was seen for a biophysical profile due to  chronic hypertension treated with labetalol and pregestational  diabetes treated with insulin.  She was diagnosed with  preeclampsia few weeks ago when her P/C ratio indicated  new onset proteinuria.  IUGR has also been noted in her  current pregnancy.  Her last growth scan two weeks ago  showed an EFW of 3 pounds 3 ounces (9th percentile for her  gestational age).  The patient reports that she has experienced a headache and  upper abdominal pain for the past 3 days.  Her blood  pressures in our office today were 163/92 and 162/95.  A biophysical profile performed today was 8 out of 8.  She  also had a nonreactive nonstress test making her total  biophysical profile score 8 out of 10.  There was normal  amniotic fluid noted today.  Doppler studies of the umbilical arteries performed today  showed a normal S/D ratio of 3.06.  There  were no signs of  absent or reversed diastolic flow.  Due to her elevated blood pressures and symptoms along  with the fact that she has been diagnosed with preeclampsia,  the patient was sent to the hospital for admission to receive a  complete course of antenatal corticosteroids and  observation/delivery.  Due to preeclampsia with fetal growth restriction, I would  recommend inpatient management until delivery.  She may  continue the antihypertensive medications (labetalol) for  blood pressure control.  As she is complaining of a headache,I would recommend that  she be continued on magnesium sulfate for maternal seizure  prophylaxis until  her antenatal corticosteroid course is  completed.  The goal for her delivery would be 34 weeks.  Delivery prior to 34 weeks would be recommended if her  blood pressures remain persistently greater than 160/100  despite medication treatment, should she require IV push  antihypertensive medication for blood pressure control,  should she complain of a persistent severe headache, should  her liver function tests increase or platelet counts decrease,  or should there be nonreassuring fetal status.  All conversations were held with the patient today with the  help of an Arabic interpreter.  Recommendations:  Inpatient management until delivery  Continue magnesium sulfate for maternal seizure prophylaxis  until her antenatal corticosteroid course is completed  Continue labetalol for blood pressure control  Delivery at 34 weeks  Delivery prior to 34 weeks would be indicated:           Should her blood pressures be persistently greater  than 160/100 despite treatment           Should she require IV push medication for blood  pressure control           Should she complain of any signs and symptoms of  severe preeclampsia           Should her PIH labs be abnormal           At any time for nonreassuring fetal status  A total of 30 minutes was spent counseling and coordinating  the care for  this patient. ----------------------------------------------------------------------                   Ma Rings, MD Electronically Signed Final Report   06/23/2021 05:49 pm ----------------------------------------------------------------------    Medications:  Scheduled  aspirin  81 mg Oral Daily   docusate sodium  100 mg Oral Daily   insulin aspart  0-14 Units Subcutaneous TID PC   insulin aspart  6 Units Subcutaneous TID WC   insulin NPH Human  12 Units Subcutaneous BID AC & HS   labetalol  400 mg Oral BID   pantoprazole  40 mg Oral Daily   prenatal multivitamin  1 tablet Oral Q1200   I have reviewed the patient's current medications.  ASSESSMENT: G2P0010 [redacted]w[redacted]d Estimated Date of Delivery: 08/11/21   Admitted for preeclampsia with severe features GDMA1 Fetal malpresentation  PLAN: 1) Preeclampsia with severe features - increased Labetalol to 400mg  bid, will continue to closely monitor -labs stable, as above, continue q 72hr or as clinically indicated  2) FWB- Cat. I Reactive NST continue q shift BPP twice weekly S/p BMZ x2 IUGR- growth last completed 7/18-1450  gm      3 lb 3 oz      9  %  3) Type 1 DM -appreciated input from DM coordinator -increased Novolog to 7u tid, continue NPH 12u bid and sliding scale  4) fetal malpresentation -scheduled for C-section @ 34wks   DISP: Continue with management as outlined above  07-31-1995 06/25/2021,3:12 PM

## 2021-06-25 NOTE — Progress Notes (Signed)
Inpatient Diabetes Program Recommendations  AACE/ADA: New Consensus Statement on Inpatient Glycemic Control (2015)  Target Ranges:  Prepandial:   less than 140 mg/dL      Peak postprandial:   less than 180 mg/dL (1-2 hours)      Critically ill patients:  140 - 180 mg/dL   Lab Results  Component Value Date   GLUCAP 152 (H) 06/25/2021   HGBA1C 8.4 (H) 05/01/2021   ADA Standards of Care 2022 Diabetes in Pregnancy Target Glucose Ranges: Results for DUSTYN, ARMBRISTER (MRN [id number]) as of 06/25/2021 14:22  Ref. Range 06/24/2021 21:28 06/25/2021 08:05 06/25/2021 11:33  Glucose-Capillary Latest Ref Range: 70 - 99 mg/dL 048 (H) 889 (H) 169 (H)   Fasting: 60 - 90 mg/dL Preprandial: 60 - 450 mg/dL 1 hr postprandial: Less than 140mg /dL (from first bite of meal) 2 hr postprandial: Less than 120 mg/dL (from first bit of meal)  Review of Glycemic Control Diabetes history: Type 1 DM Outpatient Diabetes medications: NPH 12 units BID, Novolog 5 units TID Current orders for Inpatient glycemic control: NPH 12 units BID, Novolog 5 units TID, Novolog 0-14 units tid and HS Inpatient Diabetes Program Recommendations:    Consider increasing Novolog meal coverage to 6 units tid with meals.   Thanks,  , RN, BC-ADM Inpatient Diabetes Coordinator Pager [phone]  (8a-5p)

## 2021-06-26 ENCOUNTER — Inpatient Hospital Stay (HOSPITAL_BASED_OUTPATIENT_CLINIC_OR_DEPARTMENT_OTHER): Payer: 59

## 2021-06-26 DIAGNOSIS — O09523 Supervision of elderly multigravida, third trimester: Secondary | ICD-10-CM

## 2021-06-26 DIAGNOSIS — O113 Pre-existing hypertension with pre-eclampsia, third trimester: Secondary | ICD-10-CM | POA: Diagnosis not present

## 2021-06-26 DIAGNOSIS — E109 Type 1 diabetes mellitus without complications: Secondary | ICD-10-CM

## 2021-06-26 DIAGNOSIS — O365930 Maternal care for other known or suspected poor fetal growth, third trimester, not applicable or unspecified: Secondary | ICD-10-CM

## 2021-06-26 DIAGNOSIS — Z3A33 33 weeks gestation of pregnancy: Secondary | ICD-10-CM

## 2021-06-26 DIAGNOSIS — O10913 Unspecified pre-existing hypertension complicating pregnancy, third trimester: Secondary | ICD-10-CM | POA: Diagnosis not present

## 2021-06-26 DIAGNOSIS — O24013 Pre-existing diabetes mellitus, type 1, in pregnancy, third trimester: Secondary | ICD-10-CM | POA: Diagnosis not present

## 2021-06-26 DIAGNOSIS — O0933 Supervision of pregnancy with insufficient antenatal care, third trimester: Secondary | ICD-10-CM

## 2021-06-26 LAB — GLUCOSE, CAPILLARY
Glucose-Capillary: 64 mg/dL — ABNORMAL LOW (ref 70–99)
Glucose-Capillary: 82 mg/dL (ref 70–99)
Glucose-Capillary: 40 mg/dL — CL (ref 70–99)
Glucose-Capillary: 49 mg/dL — ABNORMAL LOW (ref 70–99)
Glucose-Capillary: 73 mg/dL (ref 70–99)
Glucose-Capillary: 96 mg/dL (ref 70–99)
Glucose-Capillary: 61 mg/dL — ABNORMAL LOW (ref 70–99)
Glucose-Capillary: 74 mg/dL (ref 70–99)

## 2021-06-26 LAB — COMPREHENSIVE METABOLIC PANEL
ALT: 12 U/L (ref 0–44)
AST: 16 U/L (ref 15–41)
Albumin: 2.4 g/dL — ABNORMAL LOW (ref 3.5–5.0)
Alkaline Phosphatase: 80 U/L (ref 38–126)
Anion gap: 6 (ref 5–15)
BUN: 5 mg/dL — ABNORMAL LOW (ref 6–20)
CO2: 23 mmol/L (ref 22–32)
Calcium: 8.4 mg/dL — ABNORMAL LOW (ref 8.9–10.3)
Chloride: 107 mmol/L (ref 98–111)
Creatinine, Ser: 0.4 mg/dL — ABNORMAL LOW (ref 0.44–1.00)
GFR, Estimated: >60 mL/min (ref >60)
Glucose, Bld: 61 mg/dL — ABNORMAL LOW (ref 70–99)
Potassium: 3.2 mmol/L — ABNORMAL LOW (ref 3.5–5.1)
Sodium: 136 mmol/L (ref 135–145)
Total Bilirubin: 0.6 mg/dL (ref 0.3–1.2)
Total Protein: 5.9 g/dL — ABNORMAL LOW (ref 6.5–8.1)

## 2021-06-26 LAB — CBC
HCT: 37.2 % (ref 36.0–46.0)
Hemoglobin: 12.1 g/dL (ref 12.0–15.0)
MCH: 28.7 pg (ref 26.0–34.0)
MCHC: 32.5 g/dL (ref 30.0–36.0)
MCV: 88.4 fL (ref 80.0–100.0)
Platelets: 239 10*3/uL (ref 150–400)
RBC: 4.21 MIL/uL (ref 3.87–5.11)
RDW: 14.4 % (ref 11.5–15.5)
WBC: 5.4 10*3/uL (ref 4.0–10.5)
nRBC: 0 % (ref 0.0–0.2)

## 2021-06-26 IMAGING — US US MFM FETAL BPP W/O NON-STRESS
1 series · 15 of 28 positions shown · non-contrast
Comparison: none

[Series 1: us mfm fetal bpp w/o non-stress · 44 acquisitions, 15 frames shown]
[im 1/44]
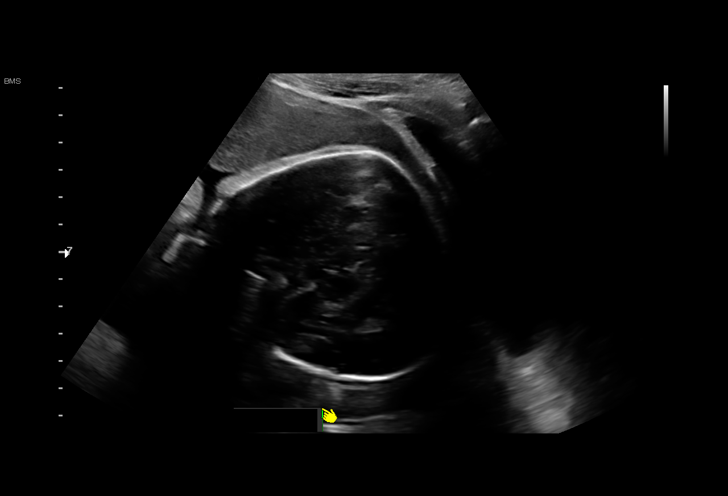
[im 4/44]
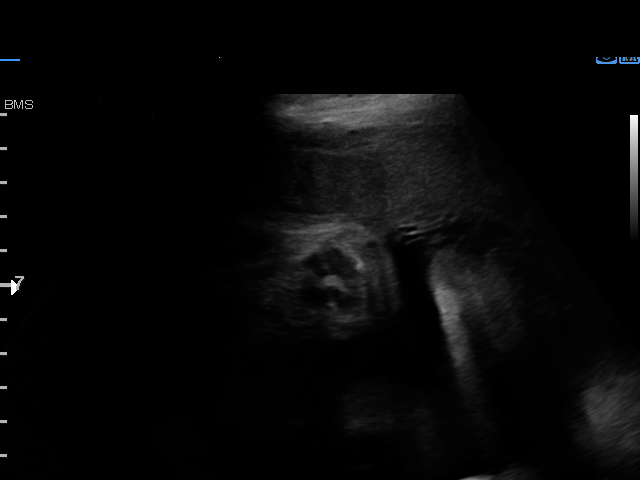
[im 7/44]
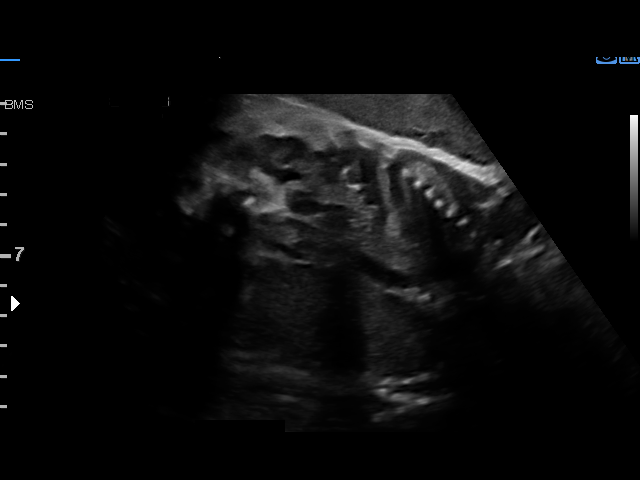
[im 10/44]
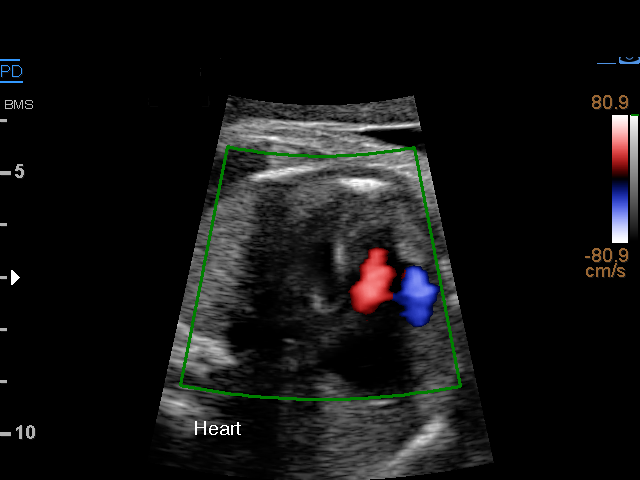
[im 13/44]
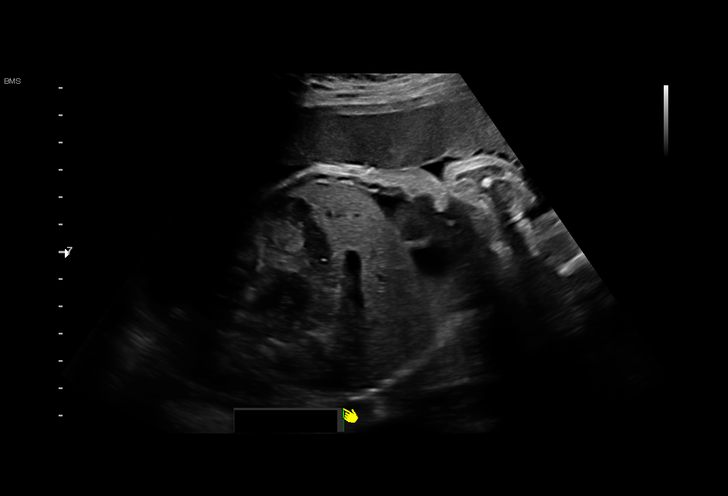
[im 16/44]
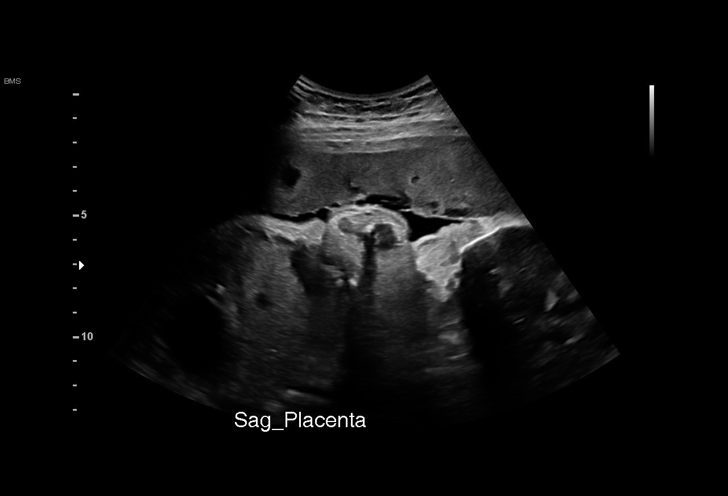
[im 20/44]
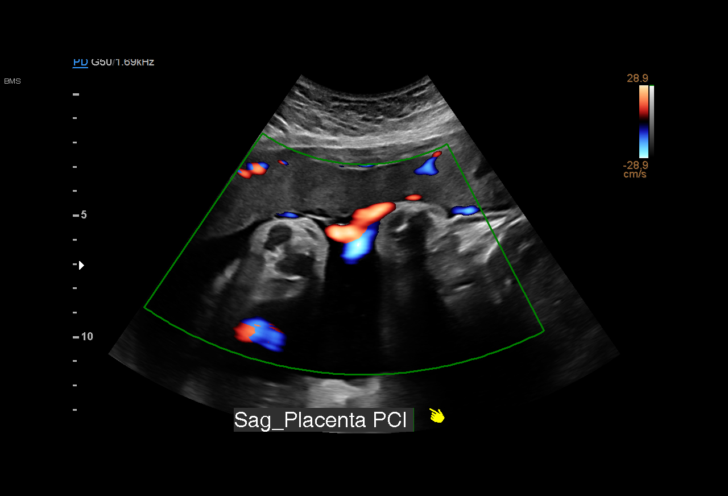
[im 23/44]
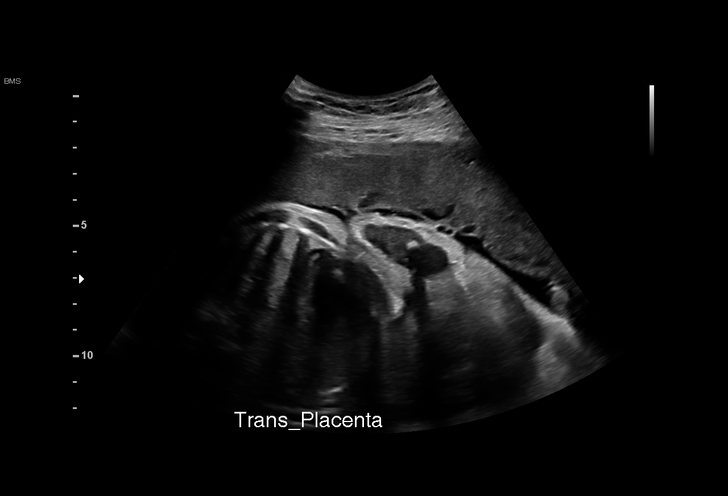
[im 24/44]
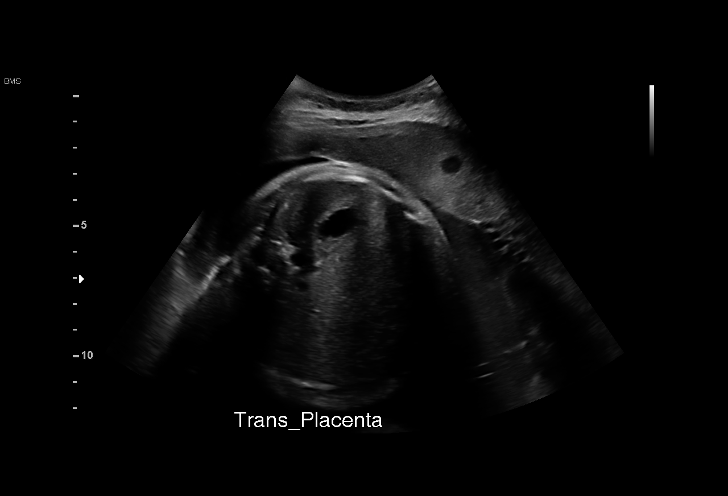
[im 28/44]
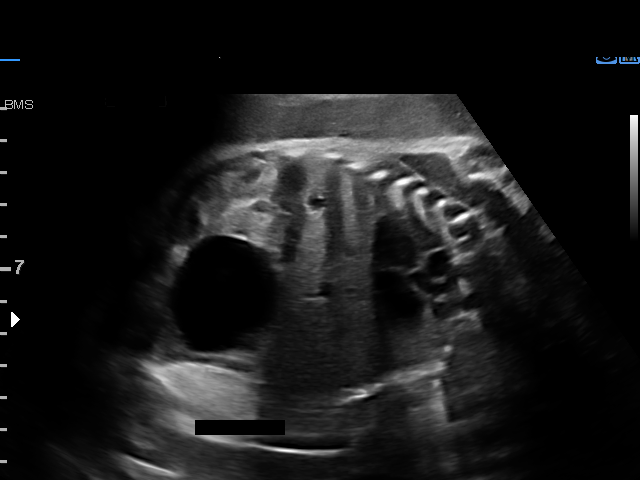
[im 31/44]
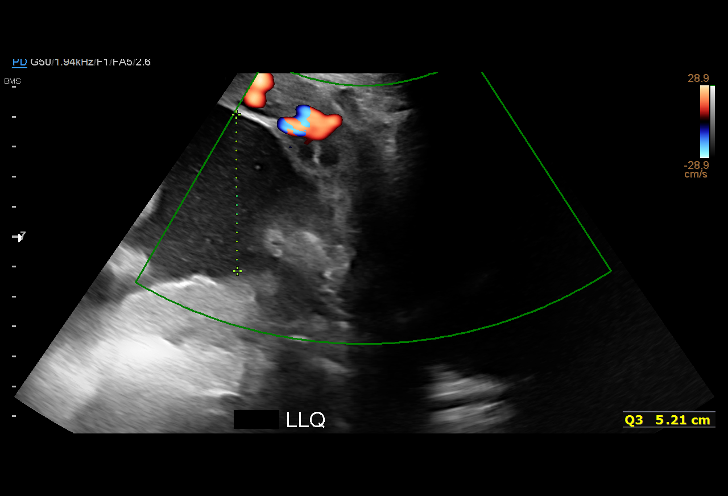
[im 34/44]
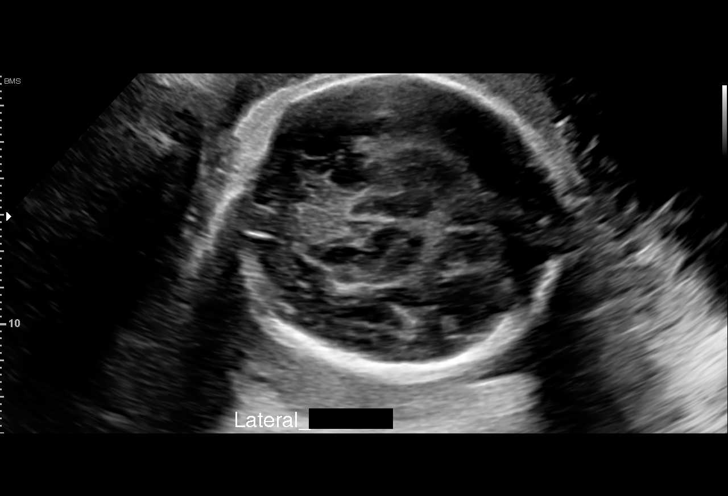
[im 37/44]
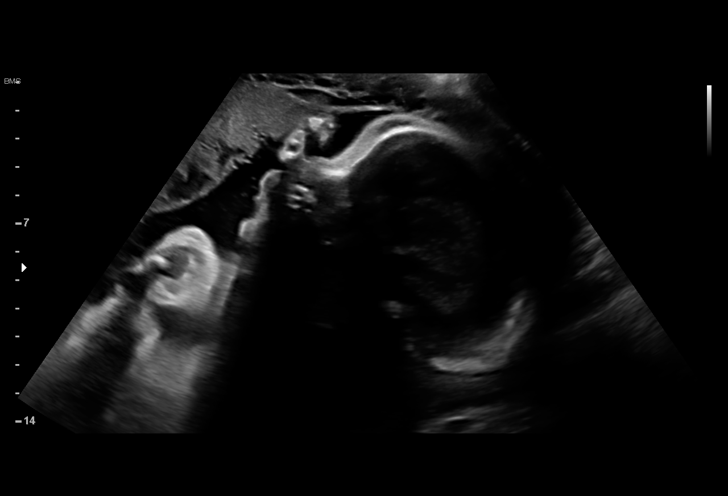
[im 40/44]
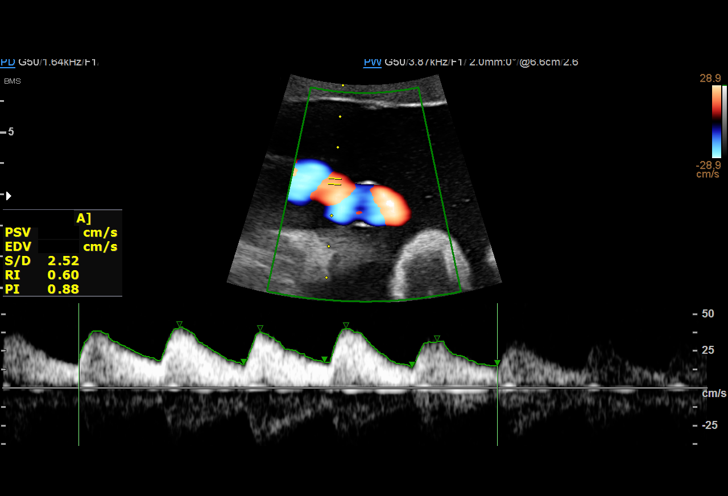
[im 44/44]
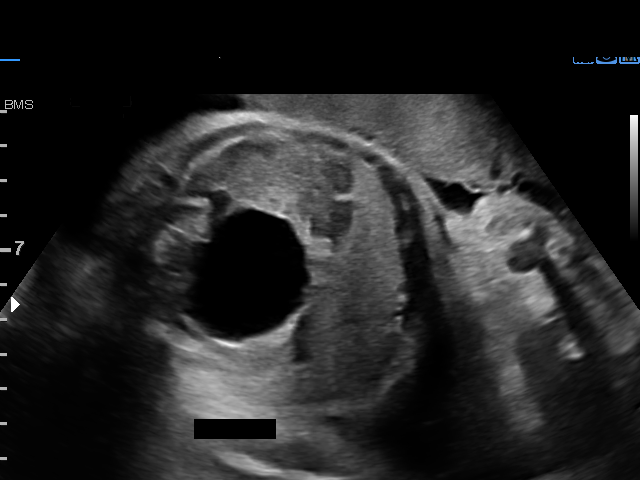

[15 of 28 positions shown; findings below may reference images not displayed]

KOMPRDA

                                                            KOMPRDA CNM
                   [6H]

Indications

 Maternal care for known or suspected poor      [6H]
 fetal growth, third trimester, not applicable or
 unspecified IUGR
 Advanced maternal age multigravida 35+,        [6H]
 third trimester
 Hypertension - Chronic with superimposed       [6H] [6H]
 preeclampsia (Labetalol)
 Pre-existing diabetes, type 1, in pregnancy,   [6H]
 third trimester
 Insufficient Prenatal Care (transferred from   [6H]
 [place])
 33 weeks gestation of pregnancy
Fetal Evaluation

 Num Of Fetuses:         1
 Fetal Heart Rate(bpm):  150
 Cardiac Activity:       Observed
 Presentation:           Cephalic
 Placenta:               Anterior
 P. Cord Insertion:      Visualized, central
 Amniotic Fluid
 AFI FV:      Within normal limits

 AFI Sum(cm)     %Tile       Largest Pocket(cm)
 12.3            35

 RUQ(cm)       RLQ(cm)       LUQ(cm)        LLQ(cm)

Biophysical Evaluation

 Amniotic F.V:   Within normal limits       F. Tone:        Observed
 F. Movement:    Observed                   Score:          [DATE]
 F. Breathing:   Observed
Biometry

 LV:        1.1  mm
OB History

 Gravidity:    2         Term:   0        Prem:   0        SAB:   1
 TOP:          0       Ectopic:  0        Living: 0
Gestational Age

 LMP:           35w 2d        Date:  [DATE]                 EDD:   [DATE]
 Best:          33w 3d     Det. By:  U/S  ([DATE])          EDD:   [DATE]
Anatomy

 Ventricles:            Appears normal         Kidneys:                Appear normal
 Diaphragm:             Appears normal         Bladder:                Appears normal
 Stomach:               Appears normal, left
                        sided
Doppler - Fetal Vessels

 Umbilical Artery
  S/D     %tile      RI    %tile      PI    %tile            ADFV    RDFV
  2.38       37    0.58       42    0.83       41               No      No

Cervix Uterus Adnexa

 Cervix
 Not visualized (advanced GA >[6H])
Comments

 This patient has been hospitalized due to IUGR and
 superimposed preeclampsia.  She has a history of chronic
 hypertension and pregestational diabetes.
 A biophysical profile performed today was [DATE].
 There was normal amniotic fluid noted on today's ultrasound
 exam.
 Doppler studies of the umbilical arteries performed today
 showed a normal S/D ratio of 2.38.  There were no signs of
 absent or reversed end-diastolic flow.
 She is already scheduled for a cesarean delivery on [REDACTED]
 [DATE].

## 2021-06-26 MED ORDER — INSULIN NPH (HUMAN) (ISOPHANE) 100 UNIT/ML ~~LOC~~ SUSP
10.0000 [IU] | Freq: Two times a day (BID) | SUBCUTANEOUS | Status: DC
  Filled 2021-06-26: qty 10

## 2021-06-26 MED ORDER — INSULIN ASPART 100 UNIT/ML ~~LOC~~ SOLN
5.0000 [IU] | Freq: Three times a day (TID) | SUBCUTANEOUS | Status: DC
  Administered 2021-06-26: 5 [IU] via SUBCUTANEOUS

## 2021-06-26 MED ORDER — INSULIN ASPART 100 UNIT/ML ~~LOC~~ SOLN: 4.0000 [IU] | Freq: Three times a day (TID) | SUBCUTANEOUS | Status: DC

## 2021-06-26 MED ORDER — LABETALOL HCL 200 MG PO TABS
400.0000 mg | ORAL_TABLET | Freq: Three times a day (TID) | ORAL | Status: DC
  Administered 2021-06-26 – 2021-06-29 (×12): 400 mg via ORAL
  Filled 2021-06-26 (×4): qty 2
  Filled 2021-06-26: qty 4
  Filled 2021-06-26 (×7): qty 2

## 2021-06-26 MED ORDER — INSULIN NPH (HUMAN) (ISOPHANE) 100 UNIT/ML ~~LOC~~ SUSP
8.0000 [IU] | Freq: Two times a day (BID) | SUBCUTANEOUS | Status: DC
  Administered 2021-06-26: 8 [IU] via SUBCUTANEOUS

## 2021-06-26 NOTE — Progress Notes (Signed)
Inpatient Diabetes Program Recommendations  ADA Standards of Care 2022 Diabetes in Pregnancy Target Glucose Ranges:  Fasting: 60 - 90 mg/dL Preprandial: 60 - 174 mg/dL 1 hr postprandial: Less than 140mg /dL (from first bite of meal) 2 hr postprandial: Less than 120 mg/dL (from first bit of meal)    Lab Results  Component Value Date   GLUCAP 82 06/26/2021   HGBA1C 8.4 (H) 05/01/2021    Review of Glycemic Control Results for DORRI, OZTURK (MRN [facility]) as of 06/26/2021 09:40  Ref. Range 06/25/2021 08:05 06/25/2021 11:33 06/25/2021 16:58 06/25/2021 22:06 06/26/2021 07:56 06/26/2021 08:18  Glucose-Capillary Latest Ref Range: 70 - 99 mg/dL 08/26/2021 (H) 638 (H) 466 (H) 178 (H) 64 (L) 82   Fasting: 60 - 90 mg/dL Preprandial: 60 - 599 mg/dL 1 hr postprandial: Less than 140mg /dL (from first bite of meal) 2 hr postprandial: Less than 120 mg/dL (from first bit of meal)  Review of Glycemic Control Diabetes history: Type 1 DM Outpatient Diabetes medications: NPH 12 units BID, Novolog 5 units TID Current orders for Inpatient glycemic control: NPH 12 units BID, Novolog 7 units TID, Novolog 0-14 units tid and HS  Inpatient Diabetes Program Recommendations:   -Consider decrease in hs NPH to 10 units Secure chat sent to Dr. 357.  Thank you, . Javen Ridings, RN, MSN, CDE  Diabetes Coordinator Inpatient Glycemic Control Team Team Pager [phone] (8am-5pm) 06/26/2021 9:41 AM

## 2021-06-26 NOTE — Progress Notes (Signed)
Hypoglycemic Event  CBG: 64 at 0756  Treatment: 4 oz juice/soda  Symptoms: Vision changes  Follow-up CBG: [id number] CBG Result:82  Possible Reasons for Event: Inadequate meal intake  Comments/MD notified:protocol followed    Tipton Ballow Joy A Karolee Meloni

## 2021-06-26 NOTE — Progress Notes (Signed)
Hypoglycemic Event  CBG: 61   Treatment: 4 oz juice/soda  Symptoms: None  Follow-up CBG: Time: 2124 CBG Result: 74  Possible Reasons for Event: Medication regimen: adjustment to insulin dosage   Comments/MD notified: Dr. Charlotta Newton,  see new orders.    Kristin Clay

## 2021-06-26 NOTE — Progress Notes (Signed)
Hypoglycemic Event  CBG: 40 at 1129  Treatment: 8 oz juice/soda  Symptoms: Sweaty  Follow-up CBG: Time:1147 CBG Result:49  Possible Reasons for Event: Inadequate meal intake and Medication regimen: scheduled insulin coverage with meals  Comments/MD notified:protocol followed  -------    CBG: 49 at 1147  Treatment: 8 oz juice/soda  Symptoms: lightheaded, weak  Follow-up CBG: Time: 1204  CBG Result: 73  Possible Reasons for Event: Inadequate meal intake and Medication regimen: scheduled insulin coverage with meals  Comments/MD notified:protocol followed   Vaani Morren Joy A Dustie Brittle

## 2021-06-26 NOTE — Progress Notes (Signed)
Patient ID: Kristin Clay, female   DOB: February 13, 1986, 36 y.o.   MRN: [id number] ACULTY PRACTICE ANTEPARTUM COMPREHENSIVE PROGRESS NOTE  Kristin Clay is a 35 y.o. G2P0010 at [redacted]w[redacted]d  who is admitted for [facility] with SPEC.   Fetal presentation is breech. Length of Stay:  3  Days  Subjective: Pt has no complaints this morning. Denies HA or visual changes Patient reports good fetal movement.  She reports no uterine contractions, no bleeding and no loss of fluid per vagina.  Vitals:  Blood pressure (!) 147/86, pulse (!) 103, temperature 97.6 F (36.4 C), temperature source Oral, resp. rate 16, height 5' (1.524 m), weight 49.7 kg, last menstrual period 10/22/2020, SpO2 100 %, unknown if currently breastfeeding. Physical Examination: Lungs clear Heart RRR Abd soft + BS gravid Ext non tender  Fetal Monitoring:  Baseline: 130-140 bpm, Variability: Good {> 6 bpm), Accelerations: Reactive, and Decelerations: Absent  Labs:  Results for orders placed or performed during the hospital encounter of 06/23/21 (from the past 24 hour(s))  Glucose, capillary   Collection Time: 06/25/21 11:33 AM  Result Value Ref Range   Glucose-Capillary 152 (H) 70 - 99 mg/dL  Glucose, capillary   Collection Time: 06/25/21  4:58 PM  Result Value Ref Range   Glucose-Capillary 198 (H) 70 - 99 mg/dL  Glucose, capillary   Collection Time: 06/25/21 10:06 PM  Result Value Ref Range   Glucose-Capillary 178 (H) 70 - 99 mg/dL  Glucose, capillary   Collection Time: 06/26/21  7:56 AM  Result Value Ref Range   Glucose-Capillary 64 (L) 70 - 99 mg/dL  Glucose, capillary   Collection Time: 06/26/21  8:18 AM  Result Value Ref Range   Glucose-Capillary 82 70 - 99 mg/dL    Imaging Studies:    UA doppler studies today   Medications:  Scheduled  aspirin  81 mg Oral Daily   docusate sodium  100 mg Oral Daily   insulin aspart  0-14 Units Subcutaneous TID PC   insulin aspart  7 Units Subcutaneous TID WC    insulin NPH Human  12 Units Subcutaneous BID AC & HS   labetalol  400 mg Oral TID   pantoprazole  40 mg Oral Daily   prenatal multivitamin  1 tablet Oral Q1200   I have reviewed the patient's current medications.  ASSESSMENT: IUP 33 3/7 weeks CHTN with SPEC, s/p celestone and Magnesium Type 1 DM Malpresentation   PLAN: BP adjusted for better control. Stable at present. CBG's decreasing since BMZ. Recommendations as per DM educator. Continue with current insulin regiment at present. Pt is scheduled for c section on Monday. Delivery earlier per maternal/fetal indications.  Continue routine antenatal care.   Hermina Staggers 06/26/2021,9:20 AM

## 2021-06-27 DIAGNOSIS — O365939 Maternal care for other known or suspected poor fetal growth, third trimester, other fetus: Secondary | ICD-10-CM | POA: Diagnosis not present

## 2021-06-27 DIAGNOSIS — O24013 Pre-existing diabetes mellitus, type 1, in pregnancy, third trimester: Secondary | ICD-10-CM | POA: Diagnosis not present

## 2021-06-27 DIAGNOSIS — O119 Pre-existing hypertension with pre-eclampsia, unspecified trimester: Secondary | ICD-10-CM | POA: Diagnosis not present

## 2021-06-27 DIAGNOSIS — Z3A33 33 weeks gestation of pregnancy: Secondary | ICD-10-CM | POA: Diagnosis not present

## 2021-06-27 LAB — COMPREHENSIVE METABOLIC PANEL
ALT: 11 U/L (ref 0–44)
AST: 15 U/L (ref 15–41)
Albumin: 2.5 g/dL — ABNORMAL LOW (ref 3.5–5.0)
Alkaline Phosphatase: 89 U/L (ref 38–126)
Anion gap: 9 (ref 5–15)
BUN: 9 mg/dL (ref 6–20)
CO2: 22 mmol/L (ref 22–32)
Calcium: 8.6 mg/dL — ABNORMAL LOW (ref 8.9–10.3)
Chloride: 105 mmol/L (ref 98–111)
Creatinine, Ser: 0.4 mg/dL — ABNORMAL LOW (ref 0.44–1.00)
GFR, Estimated: >60 mL/min (ref >60)
Glucose, Bld: 71 mg/dL (ref 70–99)
Potassium: 3.8 mmol/L (ref 3.5–5.1)
Sodium: 136 mmol/L (ref 135–145)
Total Bilirubin: 0.6 mg/dL (ref 0.3–1.2)
Total Protein: 5.9 g/dL — ABNORMAL LOW (ref 6.5–8.1)

## 2021-06-27 LAB — CBC
HCT: 38.6 % (ref 36.0–46.0)
HCT: 38.1 % (ref 36.0–46.0)
Hemoglobin: 12.8 g/dL (ref 12.0–15.0)
Hemoglobin: 12.5 g/dL (ref 12.0–15.0)
MCH: 29.1 pg (ref 26.0–34.0)
MCH: 28.7 pg (ref 26.0–34.0)
MCHC: 33.2 g/dL (ref 30.0–36.0)
MCHC: 32.8 g/dL (ref 30.0–36.0)
MCV: 87.7 fL (ref 80.0–100.0)
MCV: 87.6 fL (ref 80.0–100.0)
Platelets: 228 10*3/uL (ref 150–400)
Platelets: 242 10*3/uL (ref 150–400)
RBC: 4.4 MIL/uL (ref 3.87–5.11)
RBC: 4.35 MIL/uL (ref 3.87–5.11)
RDW: 14.3 % (ref 11.5–15.5)
RDW: 14.3 % (ref 11.5–15.5)
WBC: 5.4 10*3/uL (ref 4.0–10.5)
WBC: 5.4 10*3/uL (ref 4.0–10.5)
nRBC: 0 % (ref 0.0–0.2)
nRBC: 0 % (ref 0.0–0.2)

## 2021-06-27 LAB — GLUCOSE, CAPILLARY
Glucose-Capillary: 114 mg/dL — ABNORMAL HIGH (ref 70–99)
Glucose-Capillary: 142 mg/dL — ABNORMAL HIGH (ref 70–99)
Glucose-Capillary: 157 mg/dL — ABNORMAL HIGH (ref 70–99)
Glucose-Capillary: 66 mg/dL — ABNORMAL LOW (ref 70–99)
Glucose-Capillary: 79 mg/dL (ref 70–99)
Glucose-Capillary: 85 mg/dL (ref 70–99)
Glucose-Capillary: 90 mg/dL (ref 70–99)
Glucose-Capillary: 98 mg/dL (ref 70–99)

## 2021-06-27 LAB — TYPE AND SCREEN
ABO/RH(D): A NEG
Antibody Screen: POSITIVE

## 2021-06-27 MED ORDER — INSULIN NPH (HUMAN) (ISOPHANE) 100 UNIT/ML ~~LOC~~ SUSP: 6.0000 [IU] | Freq: Two times a day (BID) | SUBCUTANEOUS | Status: DC

## 2021-06-27 MED ORDER — MISOPROSTOL 50MCG HALF TABLET
50.0000 ug | ORAL_TABLET | ORAL | Status: DC | PRN
  Administered 2021-06-27 – 2021-06-28 (×4): 50 ug via BUCCAL
  Filled 2021-06-27 (×4): qty 1

## 2021-06-27 MED ORDER — INSULIN REGULAR(HUMAN) IN NACL 100-0.9 UT/100ML-% IV SOLN
INTRAVENOUS | Status: DC
  Administered 2021-06-27: 0.9 [IU]/h via INTRAVENOUS
  Administered 2021-06-28: 0.2 [IU]/h via INTRAVENOUS
  Administered 2021-06-28: 0.8 [IU]/h via INTRAVENOUS
  Administered 2021-06-28: 0.3 [IU]/h via INTRAVENOUS
  Administered 2021-06-28: 1 [IU]/h via INTRAVENOUS
  Administered 2021-06-28: 0.9 [IU]/h via INTRAVENOUS
  Administered 2021-06-28: 1.6 [IU]/h via INTRAVENOUS
  Filled 2021-06-27 (×2): qty 100

## 2021-06-27 MED ORDER — OXYTOCIN-SODIUM CHLORIDE 30-0.9 UT/500ML-% IV SOLN: 2.5000 [IU]/h | INTRAVENOUS | Status: DC

## 2021-06-27 MED ORDER — ACETAMINOPHEN 325 MG PO TABS
650.0000 mg | ORAL_TABLET | ORAL | Status: DC | PRN
  Administered 2021-06-28: 650 mg via ORAL
  Filled 2021-06-27: qty 2

## 2021-06-27 MED ORDER — INSULIN NPH (HUMAN) (ISOPHANE) 100 UNIT/ML ~~LOC~~ SUSP
5.0000 [IU] | Freq: Two times a day (BID) | SUBCUTANEOUS | Status: DC
  Administered 2021-06-27: 5 [IU] via SUBCUTANEOUS

## 2021-06-27 MED ORDER — MAGNESIUM SULFATE 40 GM/1000ML IV SOLN
2.0000 g/h | INTRAVENOUS | Status: AC
  Administered 2021-06-27 – 2021-06-28 (×2): 2 g/h via INTRAVENOUS
  Filled 2021-06-27 (×2): qty 1000

## 2021-06-27 MED ORDER — INSULIN ASPART 100 UNIT/ML IJ SOLN
0.0000 [IU] | INTRAMUSCULAR | Status: DC
  Administered 2021-06-27 (×2): 2 [IU] via SUBCUTANEOUS

## 2021-06-27 MED ORDER — OXYCODONE-ACETAMINOPHEN 5-325 MG PO TABS: 2.0000 | ORAL_TABLET | ORAL | Status: DC | PRN

## 2021-06-27 MED ORDER — LACTATED RINGERS IV SOLN: 500.0000 mL | INTRAVENOUS | Status: DC | PRN

## 2021-06-27 MED ORDER — MISOPROSTOL 50MCG HALF TABLET
50.0000 ug | ORAL_TABLET | ORAL | Status: DC | PRN
  Administered 2021-06-27: 50 ug via VAGINAL
  Filled 2021-06-27: qty 1

## 2021-06-27 MED ORDER — INSULIN ASPART 100 UNIT/ML ~~LOC~~ SOLN: 3.0000 [IU] | Freq: Three times a day (TID) | SUBCUTANEOUS | Status: DC

## 2021-06-27 MED ORDER — DEXTROSE 50 % IV SOLN
0.0000 mL | INTRAVENOUS | Status: DC | PRN
  Administered 2021-06-29: 20 mL via INTRAVENOUS
  Administered 2021-06-29: 15 mL via INTRAVENOUS
  Filled 2021-06-27 (×2): qty 50

## 2021-06-27 MED ORDER — OXYCODONE-ACETAMINOPHEN 5-325 MG PO TABS
1.0000 | ORAL_TABLET | ORAL | Status: DC | PRN
  Administered 2021-06-29: 1 via ORAL
  Filled 2021-06-27: qty 1

## 2021-06-27 MED ORDER — PENICILLIN G POT IN DEXTROSE 60000 UNIT/ML IV SOLN
3.0000 10*6.[IU] | INTRAVENOUS | Status: DC
  Administered 2021-06-27 – 2021-06-29 (×10): 3 10*6.[IU] via INTRAVENOUS
  Filled 2021-06-27 (×10): qty 50

## 2021-06-27 MED ORDER — LIDOCAINE HCL (PF) 1 % IJ SOLN
30.0000 mL | INTRAMUSCULAR | Status: DC | PRN
  Filled 2021-06-27: qty 30

## 2021-06-27 MED ORDER — MAGNESIUM SULFATE BOLUS VIA INFUSION
4.0000 g | Freq: Once | INTRAVENOUS | Status: AC
  Administered 2021-06-27: 4 g via INTRAVENOUS
  Filled 2021-06-27: qty 1000

## 2021-06-27 MED ORDER — DEXTROSE IN LACTATED RINGERS 5 % IV SOLN
INTRAVENOUS | Status: DC
  Administered 2021-06-28: 125 mL via INTRAVENOUS

## 2021-06-27 MED ORDER — SODIUM CHLORIDE 0.9 % IV SOLN
5.0000 10*6.[IU] | Freq: Once | INTRAVENOUS | Status: AC
  Administered 2021-06-27: 5 10*6.[IU] via INTRAVENOUS
  Filled 2021-06-27: qty 5

## 2021-06-27 MED ORDER — INSULIN ASPART 100 UNIT/ML IJ SOLN: 0.0000 [IU] | Freq: Three times a day (TID) | INTRAMUSCULAR | Status: DC

## 2021-06-27 MED ORDER — POTASSIUM CHLORIDE CRYS ER 20 MEQ PO TBCR
20.0000 meq | EXTENDED_RELEASE_TABLET | Freq: Two times a day (BID) | ORAL | Status: DC
  Administered 2021-06-27 – 2021-06-29 (×6): 20 meq via ORAL
  Filled 2021-06-27 (×8): qty 1

## 2021-06-27 MED ORDER — LACTATED RINGERS IV SOLN: INTRAVENOUS | Status: AC

## 2021-06-27 MED ORDER — SOD CITRATE-CITRIC ACID 500-334 MG/5ML PO SOLN: 30.0000 mL | ORAL | Status: DC | PRN

## 2021-06-27 MED ORDER — OXYTOCIN BOLUS FROM INFUSION
333.0000 mL | Freq: Once | INTRAVENOUS | Status: AC
  Administered 2021-06-29: 333 mL via INTRAVENOUS

## 2021-06-27 MED ORDER — ONDANSETRON HCL 4 MG/2ML IJ SOLN
4.0000 mg | Freq: Four times a day (QID) | INTRAMUSCULAR | Status: DC | PRN
  Administered 2021-06-28: 4 mg via INTRAVENOUS
  Filled 2021-06-27: qty 2

## 2021-06-27 MED ORDER — LACTATED RINGERS IV SOLN: INTRAVENOUS | Status: DC

## 2021-06-27 MED ORDER — TERBUTALINE SULFATE 1 MG/ML IJ SOLN: 0.2500 mg | Freq: Once | INTRAMUSCULAR | Status: DC | PRN

## 2021-06-27 MED ORDER — FENTANYL CITRATE (PF) 100 MCG/2ML IJ SOLN
50.0000 ug | INTRAMUSCULAR | Status: DC | PRN
  Administered 2021-06-27: 50 ug via INTRAVENOUS
  Filled 2021-06-27: qty 2

## 2021-06-27 NOTE — Progress Notes (Signed)
Inpatient Diabetes Program Recommendations  Diabetes Treatment Program Recommendations  ADA Standards of Care 2018 Diabetes in Pregnancy Target Glucose Ranges:  Fasting: 60 - 90 mg/dL Preprandial: 60 - 979 mg/dL 1 hr postprandial: Less than 140mg /dL (from first bite of meal) 2 hr postprandial: Less than 120 mg/dL (from first bite of meal)      Lab Results  Component Value Date   GLUCAP 79 06/27/2021   HGBA1C 8.4 (H) 05/01/2021    Review of Glycemic Control Results for Kristin Clay, Kristin Clay (MRN [facility]) as of 06/27/2021 07:51  Ref. Range 06/26/2021 21:01 06/26/2021 21:24 06/27/2021 05:11 06/27/2021 06:13  Glucose-Capillary Latest Ref Range: 70 - 99 mg/dL 61 (L) 74 66 (L) 79    Diabetes history: Type 1 DM Outpatient Diabetes medications: NPH 12 units BID, Novolog 5 units TID Current orders for Inpatient glycemic control: NPH 8 units BID, Novolog 3 units TID BMZ completed   Inpatient Diabetes Program Recommendations:   Noted multiple episodes of hypoglycemia and insulin adjustments. Assuming now that steroids complete, needing further decreases. Consider: -further decreasing NPH to 6 units BID -When in transferred to L&D would consider IV insulin per Endotool  Thanks, 08/27/2021, MSN, RNC-OB Diabetes Coordinator [phone] (8a-5p)

## 2021-06-27 NOTE — Progress Notes (Addendum)
Labor Progress Note Kristin Clay MARYFER TAUZIN is a 35 y.o. G2P0010 at [redacted]w[redacted]d transferring from [facility] specialty care for IOL. Patient admitted 8/1 for cHTN SI PreE w/ SF. She was started on Labetolol 400mg  TID and received betamethasone 8/1 and 8/2. She remained asymptomatic until 8/5 when she developed headaches and blurry vision with worsening pressures and was started on magnesium. She was subsequently transferred to L&D for induction.   S:  Feeling okay today, her headache has improved but is still uncomfortable. She is on board with the induction but wants something to eat and to shower. Denies blurry vision, chest pain, shortness of breath.  O:  BP 120/82   Pulse 87   Temp 98.1 F (36.7 C) (Oral)   Resp 16   Ht 5' (1.524 m)   Wt 49.7 kg   LMP 10/22/2020   SpO2 100%   BMI 21.40 kg/m  EFM: 125bpm/Moderate variability/+accels, no decels Toco: q4-5 min  CVE: Dilation: Closed Effacement (%): 50 Station: -3 Presentation: Vertex Exam by:: Dr. [email or web address]  A&P: 35 y.o. G2P0010 [redacted]w[redacted]d IOL cHTN SI PreE w/ SF. #Labor: Start with buccal Cytotec x1 #Pain: IV pain meds PRN, epidural as desired #FWB: Cat 1 #GBS positive, PCN #cHTN SI PreE w/ SF: Magnesium running, urine output good, still with headache but otherwise asymtpomatic. Pressures mainly within goal  -q72hr labs  -Continue Labetolol 400mg  TID  -Hydralazine PRN #T1DM: q2hr CBG with 5 U NPH BID and SSI q2hr  -Last CBG: 98 #Female Circumcision: Discomfort with cervical checks, will likely need deinfibulation at delivery #Rh Neg: Plan for Rh evaluation after delivery  [redacted]w[redacted]d, MD 4:37 PM  GME ATTESTATION:  I saw and evaluated the patient. I agree with the findings and the plan of care as documented in the resident's note. I have made changes to the documentation as necessary.  IOL for cHTN with SIPE with severe features. BP stable. Labs stable. On Mag. Some low blood glucose values overnight; improved today with current  dose of NPH. Continue q2hr glucose checks with corrective insulin as needed. Will consider insulin infusion as needed.  , MD OB Fellow, Faculty [facility], [facility] 06/27/2021 4:37 PM

## 2021-06-27 NOTE — Progress Notes (Signed)
Kristin Clay is a 35 y.o. G2P0010 at [redacted]w[redacted]d by   Subjective: Sleeping when CNM entered room  Objective: BP 132/89   Pulse 94   Temp 97.9 F (36.6 C) (Axillary)   Resp 16   Ht 5' (1.524 m)   Wt 49.7 kg   LMP 10/22/2020   SpO2 100%   BMI 21.40 kg/m  I/O last 3 completed shifts: In: 555 [P.O.:480; I.V.:75] Out: -  No intake/output data recorded.  FHT:  FHR: 130 bpm, variability: moderate,  accelerations:  Present,  decelerations:  Absent UC:   irregular, every 7 minutes SVE:   Dilation: Fingertip Effacement (%): 20 Station: -2 Exam by:: Mary [place] Johnson, RN  Labs: Lab Results  Component Value Date   WBC 5.4 06/27/2021   HGB 12.5 06/27/2021   HCT 38.1 06/27/2021   MCV 87.6 06/27/2021   PLT 242 06/27/2021   Patient Vitals for the past 24 hrs:  BP Temp Temp src Pulse Resp SpO2  06/27/21 2032 132/89 97.9 F (36.6 C) Axillary 94 16 --  06/27/21 1956 (!) 135/93 -- -- 92 -- --    Assessment / Plan:  LABOR --Cat I tracing --Cytotec #2 given at 1952, continue PRN --GBS treatment ordered --S/p BMZ   T1DM --Elevated CBGs this shift --D/c SSI, initiate Endotool --CNM at bedside with remote interpreter for discussion in change of treatment. Pt and FOB in agreement  CHTN SIPC --Magnesium Sulfate infusing --No signs of toxicity --Well controlled BPs, no severe symptoms  Calvert Cantor, CNM 06/27/2021, 10:31 PM

## 2021-06-27 NOTE — Consult Note (Signed)
Asked by Dr.Ervin to provide prenatal consultation for 35 y.o. G2 P0 who is now 33.[redacted] weeks EGA, with pregnancy complicated by Type 1 DM, chronic hypertension, and superimposed pre-eclampsia. She was admitted 8/1 and planned for delivery at 34 wks but has had worsening Sx and will be induced today. Received betamethasone 8/1 and 8/2, now on MgSO4.  I spoke with patient and her husband using the remote Arabic interpreter, although both of them have some proficiency with [place]. Discussed expectations for preterm infant at [redacted] weeks gestation, including possible need for DR resuscitation and usual problems with respiratory distress, feeding problems, thermoregulation, need for IV fluids/nutrition, etc. Also discussed desire to feed with mother's milk (did not discuss donor milk at this time). Projected length of stay until due date plus/minus and told them they should spend as much time with her as possible.  Patient was initially difficult to engage and seemed sleepy and distracted (Mg effect?) but later became more attentive. FOB was attentive throughout. Neither had questions but thanked me for providing information.  Thank you for consulting Neonatology.  Total time 25, face-to-face time 10 minutes.  JWimmer, MD

## 2021-06-27 NOTE — Progress Notes (Signed)
Labor Progress Note [name] MARGERIE FRAISER is a 35 y.o. G2P0010 at [redacted]w[redacted]d here for IOL due to cHTN with SIPE + severe features.  S:  Patient states she is doing well. She denies headache or vision changes at this time. She is not feeling contractions currently.  O:  BP (!) 143/93   Pulse 89   Temp 98.1 F (36.7 C) (Oral)   Resp 16   Ht 5' (1.524 m)   Wt 49.7 kg   LMP 10/22/2020   SpO2 100%   BMI 21.40 kg/m  EFM: Baseline 135 bpm, moderate variability, +accels, no decels Toco: Irregular contractions  CVE: Dilation: Fingertip Effacement (%): 20 Cervical Position: Posterior Station: -2 Presentation: Vertex Exam by:: Mary [place] Johnson, RN  A&P: 35 y.o. G2P0010 at [redacted]w[redacted]d here for IOL cHTN SI PreE w/ SF.  #Labor: Minimal change since last check and cervix remains thick and posterior. Will re-dose Cytotec 50 mcg buccal and reassess in 3-4 hours. #Pain: IV pain meds PRN, epidural as desired #FWB: Cat 1 #GBS positive, PCN #cHTN SI PreE w/ SF:  -On Mag, asymptomatic, normal to mild range BP since last check  -q72hr labs  -Continue Labetolol 400mg  TID  -Hydralazine PRN #T1DM: q2hr CBG with 5 U NPH BID and SSI q2hr  -Last CBG: 157 and received 2 units SSI  -Can transition to insulin infusion if glucose remains persistently elevated #Female Circumcision: Discomfort with cervical checks, narrow introitus, may need deinfibulation at delivery #Rh Neg: Plan for Rh evaluation after delivery  , MD Obstetrics Fellow 8:19 PM

## 2021-06-27 NOTE — Progress Notes (Signed)
Patient ID: Kristin Clay, female   DOB: Nov 23, 1986, 35 y.o.   MRN: [id number] Pt with new onset HA and blurry vision. Also one severe range BP this morning responded to protocol Due to these findings, feel delivery is indicated U/S at bedside verified vertex presentation Will check PEC labs and start magnesium Nursing, NICU and first attending notified Discussed with pt and FOB indicates for IOL of labor. They verbalized understanding

## 2021-06-27 NOTE — Progress Notes (Signed)
FACULTY PRACTICE ANTEPARTUM(COMPREHENSIVE) NOTE  Kristin Clay is a 35 y.o. G2P0010 with Estimated Date of Delivery: 08/11/21   By  best clinical estimate [redacted]w[redacted]d  who is admitted for [facility] with superimposed preeclampsia with severe features.    Fetal presentation is cephalic. Length of Stay:  4  Days  Date of admission:06/23/2021  Subjective: Pt resting comfortably.  She did note some dizziness with her low sugar, but feeling better after yogurt. Patient reports the fetal movement as active. Patient reports uterine contraction  activity as none. Patient reports  vaginal bleeding as none. Patient describes fluid per vagina as None.  Vitals:  Blood pressure (!) 144/83, pulse 90, temperature 98.5 F (36.9 C), temperature source Oral, resp. rate 16, height 5' (1.524 m), weight 49.7 kg, last menstrual period 10/22/2020, SpO2 100 %, unknown if currently breastfeeding. Vitals:   06/26/21 1929 06/26/21 2254 06/26/21 2308 06/27/21 0505  BP: (!) 152/97  (!) 141/85 (!) 144/83  Pulse: 89 89 83 90  Resp: 16  16 16   Temp: 98.2 F (36.8 C)  97.8 F (36.6 C) 98.5 F (36.9 C)  TempSrc: Oral  Oral Oral  SpO2: 100%  100% 100%  Weight:      Height:       Physical Examination:  General appearance - alert, well appearing, and in no distress Mental status - normal mood, behavior, speech, dress, motor activity, and thought processes Chest - CTAB Heart - normal rate and regular rhythm Abdomen - gravid, soft and non-tender Extremities - no edema, no calf tenderness Skin - normal coloration and turgor, no rashes, no suspicious skin lesions noted Warm and dery   Fetal Monitoring:  Baseline: 130 bpm, Variability: moderate, Accelerations: +15x15 accels, and Decelerations: Absent    reactive  Labs:  Results for orders placed or performed during the hospital encounter of 06/23/21 (from the past 24 hour(s))  Glucose, capillary   Collection Time: 06/26/21  7:56 AM  Result Value Ref Range    Glucose-Capillary 64 (L) 70 - 99 mg/dL  Glucose, capillary   Collection Time: 06/26/21  8:18 AM  Result Value Ref Range   Glucose-Capillary 82 70 - 99 mg/dL  Glucose, capillary   Collection Time: 06/26/21 11:29 AM  Result Value Ref Range   Glucose-Capillary 40 (LL) 70 - 99 mg/dL  Glucose, capillary   Collection Time: 06/26/21 11:47 AM  Result Value Ref Range   Glucose-Capillary 49 (L) 70 - 99 mg/dL  Glucose, capillary   Collection Time: 06/26/21 12:04 PM  Result Value Ref Range   Glucose-Capillary 73 70 - 99 mg/dL  Glucose, capillary   Collection Time: 06/26/21  3:17 PM  Result Value Ref Range   Glucose-Capillary 96 70 - 99 mg/dL  CBC   Collection Time: 06/26/21  8:32 PM  Result Value Ref Range   WBC 5.4 4.0 - 10.5 K/uL   RBC 4.21 3.87 - 5.11 MIL/uL   Hemoglobin 12.1 12.0 - 15.0 g/dL   HCT 08/26/21 73.2 - 20.2 %   MCV 88.4 80.0 - 100.0 fL   MCH 28.7 26.0 - 34.0 pg   MCHC 32.5 30.0 - 36.0 g/dL   RDW 54.2 70.6 - 23.7 %   Platelets 239 150 - 400 K/uL   nRBC 0.0 0.0 - 0.2 %  Comprehensive metabolic panel   Collection Time: 06/26/21  8:32 PM  Result Value Ref Range   Sodium 136 135 - 145 mmol/L   Potassium 3.2 (L) 3.5 - 5.1 mmol/L   Chloride  107 98 - 111 mmol/L   CO2 23 22 - 32 mmol/L   Glucose, Bld 61 (L) 70 - 99 mg/dL   BUN 5 (L) 6 - 20 mg/dL   Creatinine, Ser 1.44 (L) 0.44 - 1.00 mg/dL   Calcium 8.4 (L) 8.9 - 10.3 mg/dL   Total Protein 5.9 (L) 6.5 - 8.1 g/dL   Albumin 2.4 (L) 3.5 - 5.0 g/dL   AST 16 15 - 41 U/L   ALT 12 0 - 44 U/L   Alkaline Phosphatase 80 38 - 126 U/L   Total Bilirubin 0.6 0.3 - 1.2 mg/dL   GFR, Estimated >31 >54 mL/min   Anion gap 6 5 - 15  Glucose, capillary   Collection Time: 06/26/21  9:01 PM  Result Value Ref Range   Glucose-Capillary 61 (L) 70 - 99 mg/dL  Glucose, capillary   Collection Time: 06/26/21  9:24 PM  Result Value Ref Range   Glucose-Capillary 74 70 - 99 mg/dL  Glucose, capillary   Collection Time: 06/27/21  5:11 AM  Result  Value Ref Range   Glucose-Capillary 66 (L) 70 - 99 mg/dL  Type and screen [facility]   Collection Time: 06/27/21  5:22 AM  Result Value Ref Range   ABO/RH(D) A NEG    Antibody Screen POS    Sample Expiration      06/30/2021,2359 Performed at [facility], [address], [place], [place] [address]    Antibody Identification PENDING   Glucose, capillary   Collection Time: 06/27/21  6:13 AM  Result Value Ref Range   Glucose-Capillary 79 70 - 99 mg/dL    Imaging Studies:     Medications:  Scheduled  aspirin  81 mg Oral Daily   docusate sodium  100 mg Oral Daily   insulin aspart  0-14 Units Subcutaneous TID PC   insulin aspart  4 Units Subcutaneous TID WC   insulin NPH Human  8 Units Subcutaneous BID AC & HS   labetalol  400 mg Oral TID   pantoprazole  40 mg Oral Daily   prenatal multivitamin  1 tablet Oral Q1200   I have reviewed the patient's current medications.  ASSESSMENT: G2P0010 [redacted]w[redacted]d Estimated Date of Delivery: 08/11/21  Active problems:  cHTN with superimposed preeclampsia  Type 1 Diabetes PLAN: 1) cHTN with superimposed preeclampsia -BP stable -continue Labetalol 400mg  tid -currently asymptomatic -labs q 72hrs- last completed 8/4 -plan for Mag in labor  2) Type 1 DM -hypoglycemia episodes noted -NPH decreased to 8 and Novolog changed to 3 with meals   DISP: Previously breech presentation, now vertex- plan for IOL @ 34wks, will cancel C-section  10/4 06/27/2021,7:21 AM

## 2021-06-27 NOTE — Progress Notes (Signed)
Hypoglycemic Event  CBG: 66  Treatment: Yogurt   Symptoms: Dizziness   Follow-up CBG: Time: 0613 CBG Result: 79  Possible Reasons for Event: Inadequate meal intake and Medication regimen: insulin adjustment   Comments/MD notified: Dr. Charlotta Newton in department and notified     Corbin Ade

## 2021-06-28 ENCOUNTER — Inpatient Hospital Stay (HOSPITAL_COMMUNITY): Payer: 59 | Admitting: Anesthesiology

## 2021-06-28 LAB — CBC
HCT: 39.3 % (ref 36.0–46.0)
HCT: 39.7 % (ref 36.0–46.0)
Hemoglobin: 12.9 g/dL (ref 12.0–15.0)
Hemoglobin: 13.2 g/dL (ref 12.0–15.0)
MCH: 28.9 pg (ref 26.0–34.0)
MCH: 28.9 pg (ref 26.0–34.0)
MCHC: 32.8 g/dL (ref 30.0–36.0)
MCHC: 33.2 g/dL (ref 30.0–36.0)
MCV: 86.9 fL (ref 80.0–100.0)
MCV: 87.9 fL (ref 80.0–100.0)
Platelets: 231 10*3/uL (ref 150–400)
Platelets: 246 10*3/uL (ref 150–400)
RBC: 4.47 MIL/uL (ref 3.87–5.11)
RBC: 4.57 MIL/uL (ref 3.87–5.11)
RDW: 14.2 % (ref 11.5–15.5)
RDW: 14.3 % (ref 11.5–15.5)
WBC: 4.2 10*3/uL (ref 4.0–10.5)
WBC: 5.5 10*3/uL (ref 4.0–10.5)
nRBC: 0 % (ref 0.0–0.2)
nRBC: 0 % (ref 0.0–0.2)

## 2021-06-28 LAB — COMPREHENSIVE METABOLIC PANEL
ALT: 10 U/L (ref 0–44)
AST: 15 U/L (ref 15–41)
Albumin: 2.5 g/dL — ABNORMAL LOW (ref 3.5–5.0)
Alkaline Phosphatase: 91 U/L (ref 38–126)
Anion gap: 7 (ref 5–15)
BUN: 7 mg/dL (ref 6–20)
CO2: 20 mmol/L — ABNORMAL LOW (ref 22–32)
Calcium: 7.6 mg/dL — ABNORMAL LOW (ref 8.9–10.3)
Chloride: 107 mmol/L (ref 98–111)
Creatinine, Ser: 0.34 mg/dL — ABNORMAL LOW (ref 0.44–1.00)
GFR, Estimated: >60 mL/min (ref >60)
Glucose, Bld: 83 mg/dL (ref 70–99)
Potassium: 3.9 mmol/L (ref 3.5–5.1)
Sodium: 134 mmol/L — ABNORMAL LOW (ref 135–145)
Total Bilirubin: 0.6 mg/dL (ref 0.3–1.2)
Total Protein: 6.1 g/dL — ABNORMAL LOW (ref 6.5–8.1)

## 2021-06-28 LAB — GLUCOSE, CAPILLARY
Glucose-Capillary: 100 mg/dL — ABNORMAL HIGH (ref 70–99)
Glucose-Capillary: 100 mg/dL — ABNORMAL HIGH (ref 70–99)
Glucose-Capillary: 103 mg/dL — ABNORMAL HIGH (ref 70–99)
Glucose-Capillary: 109 mg/dL — ABNORMAL HIGH (ref 70–99)
Glucose-Capillary: 111 mg/dL — ABNORMAL HIGH (ref 70–99)
Glucose-Capillary: 111 mg/dL — ABNORMAL HIGH (ref 70–99)
Glucose-Capillary: 116 mg/dL — ABNORMAL HIGH (ref 70–99)
Glucose-Capillary: 117 mg/dL — ABNORMAL HIGH (ref 70–99)
Glucose-Capillary: 118 mg/dL — ABNORMAL HIGH (ref 70–99)
Glucose-Capillary: 118 mg/dL — ABNORMAL HIGH (ref 70–99)
Glucose-Capillary: 124 mg/dL — ABNORMAL HIGH (ref 70–99)
Glucose-Capillary: 128 mg/dL — ABNORMAL HIGH (ref 70–99)
Glucose-Capillary: 149 mg/dL — ABNORMAL HIGH (ref 70–99)
Glucose-Capillary: 68 mg/dL — ABNORMAL LOW (ref 70–99)
Glucose-Capillary: 82 mg/dL (ref 70–99)
Glucose-Capillary: 84 mg/dL (ref 70–99)
Glucose-Capillary: 84 mg/dL (ref 70–99)
Glucose-Capillary: 85 mg/dL (ref 70–99)
Glucose-Capillary: 85 mg/dL (ref 70–99)
Glucose-Capillary: 89 mg/dL (ref 70–99)
Glucose-Capillary: 89 mg/dL (ref 70–99)
Glucose-Capillary: 92 mg/dL (ref 70–99)
Glucose-Capillary: 94 mg/dL (ref 70–99)
Glucose-Capillary: 96 mg/dL (ref 70–99)

## 2021-06-28 LAB — RPR: RPR Ser Ql: NONREACTIVE

## 2021-06-28 MED ORDER — EPHEDRINE 5 MG/ML INJ: 10.0000 mg | INTRAVENOUS | Status: DC | PRN

## 2021-06-28 MED ORDER — FENTANYL-BUPIVACAINE-NACL 0.5-0.125-0.9 MG/250ML-% EP SOLN
12.0000 mL/h | EPIDURAL | Status: DC | PRN
  Administered 2021-06-28 – 2021-06-29 (×2): 12 mL/h via EPIDURAL
  Filled 2021-06-28 (×2): qty 250

## 2021-06-28 MED ORDER — SODIUM CHLORIDE 0.9 % IV SOLN: INTRAVENOUS | Status: DC

## 2021-06-28 MED ORDER — LACTATED RINGERS IV SOLN
500.0000 mL | Freq: Once | INTRAVENOUS | Status: AC
  Administered 2021-06-28: 500 mL via INTRAVENOUS

## 2021-06-28 MED ORDER — OXYTOCIN-SODIUM CHLORIDE 30-0.9 UT/500ML-% IV SOLN
1.0000 m[IU]/min | INTRAVENOUS | Status: DC
  Administered 2021-06-28: 1 m[IU]/min via INTRAVENOUS
  Filled 2021-06-28: qty 500

## 2021-06-28 MED ORDER — PHENYLEPHRINE 40 MCG/ML (10ML) SYRINGE FOR IV PUSH (FOR BLOOD PRESSURE SUPPORT)
80.0000 ug | PREFILLED_SYRINGE | INTRAVENOUS | Status: DC | PRN
  Filled 2021-06-28: qty 10

## 2021-06-28 MED ORDER — ACETAMINOPHEN 500 MG PO TABS
1000.0000 mg | ORAL_TABLET | Freq: Once | ORAL | Status: AC
  Administered 2021-06-28: 1000 mg via ORAL
  Filled 2021-06-28: qty 2

## 2021-06-28 MED ORDER — PHENYLEPHRINE 40 MCG/ML (10ML) SYRINGE FOR IV PUSH (FOR BLOOD PRESSURE SUPPORT): 80.0000 ug | PREFILLED_SYRINGE | INTRAVENOUS | Status: DC | PRN

## 2021-06-28 MED ORDER — LIDOCAINE HCL (PF) 1 % IJ SOLN
INTRAMUSCULAR | Status: DC | PRN
  Administered 2021-06-28: 11 mL via EPIDURAL

## 2021-06-28 MED ORDER — DIPHENHYDRAMINE HCL 50 MG/ML IJ SOLN
12.5000 mg | INTRAMUSCULAR | Status: DC | PRN
Start: 2021-06-28 — End: 2021-06-29

## 2021-06-28 MED ORDER — TERBUTALINE SULFATE 1 MG/ML IJ SOLN: 0.2500 mg | Freq: Once | INTRAMUSCULAR | Status: DC | PRN

## 2021-06-28 NOTE — Progress Notes (Signed)
Labor Progress Note Kristin Clay SUNDI SLEVIN is a 35 y.o. G2P0010 at [redacted]w[redacted]d here for IOL due to cHTN with SIPE + severe features. S:  Doing okay, does endorse moderate headache and scotomas at this time. Denies chest pain or SOB. Is in agreement that she may need an early epidural for continued management of labor. Feeling her contractions as uncomfortable pain  O:  BP (!) 139/92   Pulse 88   Temp (!) 97.5 F (36.4 C) (Axillary)   Resp 16   Ht 5' (1.524 m)   Wt 49.7 kg   LMP 10/22/2020   SpO2 100%   BMI 21.40 kg/m  EFM: 130bpm/moderate variability/+accels, no decels Toco: q3-70min  CVE: Dilation: 1 Effacement (%): 50 Cervical Position: Posterior Station: -3 Presentation: Vertex Exam by:: Dr Dorma Russell   A&P: 35 y.o. G2P0010 [redacted]w[redacted]d IOL cHTN SI PreE w/ SF #Labor: Minimal change s/p 4 doses of cytotec however now uncomfortable. Does not tolerate checks well so prior to attempting foley bulb will get early epidural #Pain: Epidural as desired #FWB: Cat 1 #GBS positive, PCN #cHTN SI PreE w/ SF:  -On Mag, normal to mild range BP since last check. Now with scotoma and headaches             -q72hr labs             -Continue Labetolol 400mg  TID             -Hydralazine PRN  -tylenol PRN #T1DM: Endotool             -Last CBG: 128 #Female Circumcision: Discomfort with cervical checks, narrow introitus, may need deinfibulation at delivery, early epidural #Rh Neg: Plan for Rh evaluation after delivery  , MD 9:35 AM

## 2021-06-28 NOTE — Progress Notes (Signed)
aysa larivee MRN: [id number]  Subjective: -Care assumed of 35 y.o. G2P0010 at [redacted]w[redacted]d who presents for IOL s/t cHTN with SIPE w/SF. Pregnancy and medical history significant for Type I Diabetic, IUGR, Female Circumcision, Rh Negative, and GBS Positive. She also is Arabic speaking. Provider to room to meet acquaintance of patient and family.  Patient resting in bed, but has visual pain and discomfort with contractions.  Through interpreter, patient states she is having lower back pain,b ut denies pelvic/rectal pain or pressure.   Epidural in place.  Patient reports some blurry vision, but none currently.  She also denies HA and RUQ pain.    Objective: BP 120/85   Pulse 72   Temp 97.8 F (36.6 C)   Resp 16   Ht 5' (1.524 m)   Wt 49.7 kg   LMP 10/22/2020   SpO2 100%   BMI 21.40 kg/m  I/O last 3 completed shifts: In: 7041.6 [P.O.:1967; I.V.:4189.2; Other:85.4; IV Piggyback:800] Out: 7400 [Urine:7400] Total I/O In: 326.7 [I.V.:302.7; Other:24] Out: 300 [Urine:300]  Fetal Monitoring: FHT: 110 bpm, Mod Var, -Decels, +10x10 Accels UC: Q2-35min, palpates moderate    Vaginal Exam: SVE:   Dilation: 3 Effacement (%): 50 Station: -3, -2 Exam by:: J.Traeson Dusza, CNM Membranes:SROM x 9 hrs Internal Monitors: None  Augmentation/Induction: Pitocin:Initiated Cytotec: S/P 5 Doses  Assessment:  IUP at 33.5 weeks Cat I FT  cHTN w/SIPE w/SF T1DM GBS Positive AFebrile Language Barrier Female Circumcision  Plan: -Cervical exam performed and discussed. -Reviewed initiation of pitocin to promote regular contractions.  -Discussed risks r/t pitocin usage. -Will start at 63mUn/min and increase by 54mUN/min. -Endotool operational and most recent BS was 96.  -PCN ordered and next dose due after 2215. -Patient had disclosed to nurse that she did not desire FOB in room for delivery. -Provider requests FOB step into hallway, which he was agreeable to. -During that time, patient clarifies  that she does desire FOB in delivery and was upset earlier when she requested that he not be present. -Patient states she has no other issues to disclose and encouraged to let provider and staff know if this changes. -Interpretations completed with assistance of virtual interpreter: Ziad [phone] -Continue other mgmt as ordered. -Dr. Marquis Lunch. Anyanwu to be updated on patient status as appropriate.    Valma Cava, CNM Advanced Practice Provider, Center for [facility] 06/28/2021, 8:41 PM

## 2021-06-28 NOTE — Anesthesia Preprocedure Evaluation (Signed)
Anesthesia Evaluation    Airway Mallampati: II  TM Distance: >3 FB Neck ROM: Full    Dental no notable dental hx.    Pulmonary    Pulmonary exam normal breath sounds clear to auscultation       Cardiovascular hypertension, Pt. on medications Normal cardiovascular exam Rhythm:Regular Rate:Normal     Neuro/Psych    GI/Hepatic   Endo/Other  diabetes  Renal/GU      Musculoskeletal   Abdominal   Peds  Hematology   Anesthesia Other Findings   Reproductive/Obstetrics (+) Pregnancy                             Anesthesia Physical Anesthesia Plan  ASA: 3  Anesthesia Plan: Epidural   Post-op Pain Management:    Induction:   PONV Risk Score and Plan:   Airway Management Planned:   Additional Equipment:   Intra-op Plan:   Post-operative Plan:   Informed Consent:   Plan Discussed with:   Anesthesia Plan Comments:         Anesthesia Quick Evaluation

## 2021-06-28 NOTE — Progress Notes (Signed)
Kristin Clay RAYGAN SKARDA is a 35 y.o. G2P0010 at [redacted]w[redacted]d  admitted for induction of labor due to [facility] with SI preE with SF.  Subjective: Patient reports she feels overall ok. Epidural helping with pain.   Objective: BP 123/83   Pulse 80   Temp 97.8 F (36.6 C) (Oral)   Resp 16   Ht 5' (1.524 m)   Wt 49.7 kg   LMP 10/22/2020   SpO2 100%   BMI 21.40 kg/m  I/O last 3 completed shifts: In: 3652.1 [P.O.:997; I.V.:2555.1; IV Piggyback:100] Out: 3410 [Urine:3410] Total I/O In: 2419.7 [P.O.:620; I.V.:1362.3; Other:37.4; IV Piggyback:400] Out: 2590 [Urine:2590]  FHT:  FHR: 140 bpm, variability: moderate,  accelerations:  Abscent,  decelerations:  Absent UC:   none SVE:   Dilation: 1 Effacement (%): 50 Station: -3 Exam by:: Dr Dorma Russell  Labs: Lab Results  Component Value Date   WBC 5.5 06/28/2021   HGB 13.2 06/28/2021   HCT 39.7 06/28/2021   MCV 86.9 06/28/2021   PLT 246 06/28/2021    Assessment / Plan: Induction of labor due to cHTN with si PreE in early stages of induction  Labor:  Cooks placed with speculum guidance, will give Cytotec #4 once reactive NST Preeclampsia:  no signs or symptoms of toxicity Fetal Wellbeing:  Category I Pain Control:  Epidural I/D:   GBS +, SROM will plan for antibiotics once more active labor Anticipated MOD:  NSVD  Warner Mccreedy, MD, MPH OB Fellow, Faculty Practice

## 2021-06-28 NOTE — Progress Notes (Addendum)
Kristin Clay is a 35 y.o. G2P0010 at [redacted]w[redacted]d    Subjective: Endorsing intermittent pressure.   Objective: BP 136/90   Pulse 88   Temp 97.9 F (36.6 C) (Axillary)   Resp 16   Ht 5' (1.524 m)   Wt 49.7 kg   LMP 10/22/2020   SpO2 100%   BMI 21.40 kg/m  I/O last 3 completed shifts: In: 555 [P.O.:480; I.V.:75] Out: -  No intake/output data recorded.  FHT:  FHR: 130 bpm, variability: moderate,  accelerations:  Present,  decelerations:  Absent UC:   regular, every 2-5 minutes SVE:   Dilation: 1 Effacement (%): 40 Station: Ballotable Exam by:: Thalia Bloodgood, CNM  Labs: Lab Results  Component Value Date   WBC 5.4 06/27/2021   HGB 12.5 06/27/2021   HCT 38.1 06/27/2021   MCV 87.6 06/27/2021   PLT 242 06/27/2021    Assessment / Plan: --Cat I tracing --GBS + --Now tight 1/40/ballotable --Vertex presentation confirmed with BSUS --Patient not a candidate for foley balloon due to hx of infibulation --Continue Cytotec  --Mild BP elevations in context of Magnesium Sulfate infusion --No severe symptoms --CBGs improving on Endotool --Anticipate vaginal delivery  Calvert Cantor, CNM 06/28/2021, 12:01 AM

## 2021-06-28 NOTE — Progress Notes (Signed)
Pt had + emesis

## 2021-06-28 NOTE — Progress Notes (Signed)
Foley bulb deflated and removed

## 2021-06-28 NOTE — Progress Notes (Signed)
Kristin Clay is a 35 y.o. G2P0010 at [redacted]w[redacted]d   Subjective: C/o headache, anterior and bilateral. Pain score 5/10.  Objective: BP 121/82   Pulse 87   Temp 97.9 F (36.6 C) (Axillary)   Resp 18   Ht 5' (1.524 m)   Wt 49.7 kg   LMP 10/22/2020   SpO2 100%   BMI 21.40 kg/m  I/O last 3 completed shifts: In: 1070.5 [P.O.:580; I.V.:490.5] Out: 450 [Urine:450] Total I/O In: 2073 [P.O.:561; I.V.:1412; IV Piggyback:100] Out: 1760 [Urine:1760]  FHT:  FHR: 125 bpm, variability: moderate,  accelerations:  Present,  decelerations:  Absent UC:   irregular, every 2-6 minutes SVE:   Dilation: 1 Effacement (%): 40 Station: Ballotable Exam by:: Thalia Bloodgood, CNM  Labs: Lab Results  Component Value Date   WBC 4.2 06/28/2021   HGB 12.9 06/28/2021   HCT 39.3 06/28/2021   MCV 87.9 06/28/2021   PLT 231 06/28/2021    Assessment / Plan: --Cat I tracing --Doing well on Endotool --New onset headache, will give 1G Tylenol and reassess --BP stable on MgS04  Calvert Cantor, CNM 06/28/2021, 4:16 AM

## 2021-06-28 NOTE — Anesthesia Procedure Notes (Signed)
Epidural Patient location during procedure: OB Start time: 06/28/2021 10:43 AM End time: 06/28/2021 11:00 AM  Staffing Anesthesiologist: Lowella Curb, MD Performed: anesthesiologist   Preanesthetic Checklist Completed: patient identified, IV checked, site marked, risks and benefits discussed, surgical consent, monitors and equipment checked, pre-op evaluation and timeout performed  Epidural Patient position: sitting Prep: ChloraPrep Patient monitoring: heart rate, cardiac monitor, continuous pulse ox and blood pressure Approach: midline Location: L2-L3 Injection technique: LOR saline  Needle:  Needle type: Tuohy  Needle gauge: 17 G Needle length: 9 cm Needle insertion depth: 5 cm Catheter type: closed end flexible Catheter size: 20 Guage Catheter at skin depth: 9 cm Test dose: negative  Assessment Events: blood not aspirated, injection not painful, no injection resistance, no paresthesia and negative IV test  Additional Notes Reason for block:procedure for pain

## 2021-06-28 NOTE — Progress Notes (Signed)
Hypoglycemic Event  CBG: 68  Treatment: 8 oz juice/soda  Symptoms: None  Follow-up CBG: Time:0404 CBG Result:85  Possible Reasons for Event: Unknown  Comments/MD notified: Provider notified and aware    Lars Pinks

## 2021-06-28 NOTE — Progress Notes (Signed)
Labor Progress Note [name] ANGEE GUPTON is a 35 y.o. G2P0010 at [redacted]w[redacted]d here for IOL due to cHTN with SIPE + severe features. S:  Called to room due to patient feeling severe pain and pressure. Patient tearful and writhing in the bed. Patient was comfortable at time of leaving room  O:  BP 125/80   Pulse 78   Temp 97.6 F (36.4 C) (Oral)   Resp 15   Ht 5' (1.524 m)   Wt 49.7 kg   LMP 10/22/2020   SpO2 100%   BMI 21.40 kg/m  EFM: 120bpm/moderate variability/+accels, no decels Toco: q55min  CVE: Dilation: 3 Effacement (%): 60 Cervical Position: Posterior Station: -2 Presentation: Vertex Exam by:: Dr Tami Ribas   A&P: 35 y.o. G2P0010 [redacted]w[redacted]d IOL cHTN SI PreE w/ SF #Labor: S/p 5 doses of cytotec and foley bulb placed around 1500. Patient with SROM @ 1100. Patient checked with foley in place and found to be a 3 with the foley low in the vaginal vault. Patient did not get any relief with decreasing foley balloon so decision was made to remove entirely. Once 4 hours since last cytotec will start pitocin. #Pain: Epidural in place, consider redosing  #FWB: Cat 1 #GBS positive, PCN #cHTN SI PreE w/ SF:  -On Mag, normal to mild range BP since last check. Asymptomatic             -q72hr labs             -Continue Labetolol 400mg  TID             -Hydralazine PRN  -tylenol PRN #T1DM: Endotool             -Last CBG: 109 #Female Circumcision: Discomfort with cervical checks, narrow introitus, may need deinfibulation at delivery, epidural in place #Rh Neg: Plan for Rh evaluation after delivery  , MD 6:03 PM

## 2021-06-28 NOTE — Progress Notes (Signed)
Discussed with patient and husband the procedure for an epidural and placement of foley catheter.  Both verbalized understanding.  Interpreter used

## 2021-06-29 ENCOUNTER — Encounter (HOSPITAL_COMMUNITY): Payer: Self-pay | Admitting: Obstetrics and Gynecology

## 2021-06-29 DIAGNOSIS — O2402 Pre-existing diabetes mellitus, type 1, in childbirth: Secondary | ICD-10-CM

## 2021-06-29 DIAGNOSIS — O114 Pre-existing hypertension with pre-eclampsia, complicating childbirth: Secondary | ICD-10-CM

## 2021-06-29 DIAGNOSIS — O365930 Maternal care for other known or suspected poor fetal growth, third trimester, not applicable or unspecified: Secondary | ICD-10-CM

## 2021-06-29 DIAGNOSIS — Z3A33 33 weeks gestation of pregnancy: Secondary | ICD-10-CM

## 2021-06-29 DIAGNOSIS — O99824 Streptococcus B carrier state complicating childbirth: Secondary | ICD-10-CM

## 2021-06-29 LAB — CBC
HCT: 38.1 % (ref 36.0–46.0)
HCT: 35.4 % — ABNORMAL LOW (ref 36.0–46.0)
Hemoglobin: 12.6 g/dL (ref 12.0–15.0)
Hemoglobin: 11.3 g/dL — ABNORMAL LOW (ref 12.0–15.0)
MCH: 29.4 pg (ref 26.0–34.0)
MCH: 28.7 pg (ref 26.0–34.0)
MCHC: 33.1 g/dL (ref 30.0–36.0)
MCHC: 31.9 g/dL (ref 30.0–36.0)
MCV: 89 fL (ref 80.0–100.0)
MCV: 89.8 fL (ref 80.0–100.0)
Platelets: 211 10*3/uL (ref 150–400)
Platelets: 211 10*3/uL (ref 150–400)
RBC: 4.28 MIL/uL (ref 3.87–5.11)
RBC: 3.94 MIL/uL (ref 3.87–5.11)
RDW: 14.5 % (ref 11.5–15.5)
RDW: 13.9 % (ref 11.5–15.5)
WBC: 4.7 10*3/uL (ref 4.0–10.5)
WBC: 5.6 10*3/uL (ref 4.0–10.5)
nRBC: 0 % (ref 0.0–0.2)
nRBC: 0 % (ref 0.0–0.2)

## 2021-06-29 LAB — GLUCOSE, CAPILLARY
Glucose-Capillary: 100 mg/dL — ABNORMAL HIGH (ref 70–99)
Glucose-Capillary: 101 mg/dL — ABNORMAL HIGH (ref 70–99)
Glucose-Capillary: 107 mg/dL — ABNORMAL HIGH (ref 70–99)
Glucose-Capillary: 112 mg/dL — ABNORMAL HIGH (ref 70–99)
Glucose-Capillary: 124 mg/dL — ABNORMAL HIGH (ref 70–99)
Glucose-Capillary: 152 mg/dL — ABNORMAL HIGH (ref 70–99)
Glucose-Capillary: 156 mg/dL — ABNORMAL HIGH (ref 70–99)
Glucose-Capillary: 162 mg/dL — ABNORMAL HIGH (ref 70–99)
Glucose-Capillary: 174 mg/dL — ABNORMAL HIGH (ref 70–99)
Glucose-Capillary: 180 mg/dL — ABNORMAL HIGH (ref 70–99)
Glucose-Capillary: 68 mg/dL — ABNORMAL LOW (ref 70–99)
Glucose-Capillary: 69 mg/dL — ABNORMAL LOW (ref 70–99)
Glucose-Capillary: 73 mg/dL (ref 70–99)
Glucose-Capillary: 74 mg/dL (ref 70–99)
Glucose-Capillary: 95 mg/dL (ref 70–99)
Glucose-Capillary: 95 mg/dL (ref 70–99)
Glucose-Capillary: 99 mg/dL (ref 70–99)

## 2021-06-29 LAB — COMPREHENSIVE METABOLIC PANEL
ALT: 12 U/L (ref 0–44)
AST: 15 U/L (ref 15–41)
Albumin: 2.4 g/dL — ABNORMAL LOW (ref 3.5–5.0)
Alkaline Phosphatase: 101 U/L (ref 38–126)
Anion gap: 9 (ref 5–15)
BUN: 6 mg/dL (ref 6–20)
CO2: 19 mmol/L — ABNORMAL LOW (ref 22–32)
Calcium: 6.4 mg/dL — CL (ref 8.9–10.3)
Chloride: 103 mmol/L (ref 98–111)
Creatinine, Ser: 0.46 mg/dL (ref 0.44–1.00)
GFR, Estimated: >60 mL/min (ref >60)
Glucose, Bld: 93 mg/dL (ref 70–99)
Potassium: 4.4 mmol/L (ref 3.5–5.1)
Sodium: 131 mmol/L — ABNORMAL LOW (ref 135–145)
Total Bilirubin: 1.2 mg/dL (ref 0.3–1.2)
Total Protein: 6.1 g/dL — ABNORMAL LOW (ref 6.5–8.1)

## 2021-06-29 MED ORDER — PRENATAL MULTIVITAMIN CH
1.0000 | ORAL_TABLET | Freq: Every day | ORAL | Status: DC
  Administered 2021-06-30 – 2021-07-01 (×2): 1 via ORAL
  Filled 2021-06-29 (×2): qty 1

## 2021-06-29 MED ORDER — LIDOCAINE-EPINEPHRINE (PF) 2 %-1:200000 IJ SOLN
INTRAMUSCULAR | Status: DC | PRN
  Administered 2021-06-29: 3 mL via EPIDURAL
  Administered 2021-06-29: 2 mL via EPIDURAL

## 2021-06-29 MED ORDER — FERROUS SULFATE 325 (65 FE) MG PO TABS: 325.0000 mg | ORAL_TABLET | Freq: Two times a day (BID) | ORAL | Status: DC

## 2021-06-29 MED ORDER — MAGNESIUM SULFATE 40 GM/1000ML IV SOLN
INTRAVENOUS | Status: AC
  Filled 2021-06-29: qty 1000

## 2021-06-29 MED ORDER — COCONUT OIL OIL
1.0000 "application " | TOPICAL_OIL | Status: DC | PRN
  Administered 2021-06-30: 1 via TOPICAL

## 2021-06-29 MED ORDER — BENZOCAINE-MENTHOL 20-0.5 % EX AERO
1.0000 "application " | INHALATION_SPRAY | CUTANEOUS | Status: DC | PRN
  Administered 2021-07-01: 1 via TOPICAL
  Filled 2021-06-29 (×2): qty 56

## 2021-06-29 MED ORDER — ONDANSETRON HCL 4 MG/2ML IJ SOLN: 4.0000 mg | INTRAMUSCULAR | Status: DC | PRN

## 2021-06-29 MED ORDER — MAGNESIUM SULFATE BOLUS VIA INFUSION
4.0000 g | Freq: Once | INTRAVENOUS | Status: DC
  Filled 2021-06-29: qty 1000

## 2021-06-29 MED ORDER — CEFAZOLIN SODIUM-DEXTROSE 2-4 GM/100ML-% IV SOLN
2.0000 g | Freq: Once | INTRAVENOUS | Status: AC
  Administered 2021-06-29: 2 g via INTRAVENOUS
  Filled 2021-06-29: qty 100

## 2021-06-29 MED ORDER — SENNOSIDES-DOCUSATE SODIUM 8.6-50 MG PO TABS
2.0000 | ORAL_TABLET | Freq: Every day | ORAL | Status: DC
  Administered 2021-06-30 – 2021-07-02 (×3): 2 via ORAL
  Filled 2021-06-29 (×3): qty 2

## 2021-06-29 MED ORDER — MAGNESIUM SULFATE 40 GM/1000ML IV SOLN
2.0000 g/h | INTRAVENOUS | Status: AC
  Administered 2021-06-29 – 2021-06-30 (×2): 2 g/h via INTRAVENOUS
  Filled 2021-06-29: qty 1000

## 2021-06-29 MED ORDER — WITCH HAZEL-GLYCERIN EX PADS: 1.0000 "application " | MEDICATED_PAD | CUTANEOUS | Status: DC | PRN

## 2021-06-29 MED ORDER — DIBUCAINE (PERIANAL) 1 % EX OINT: 1.0000 "application " | TOPICAL_OINTMENT | CUTANEOUS | Status: DC | PRN

## 2021-06-29 MED ORDER — FERROUS SULFATE 325 (65 FE) MG PO TABS
325.0000 mg | ORAL_TABLET | Freq: Every day | ORAL | Status: DC
  Administered 2021-06-30 – 2021-07-02 (×3): 325 mg via ORAL
  Filled 2021-06-29 (×3): qty 1

## 2021-06-29 MED ORDER — IBUPROFEN 600 MG PO TABS
600.0000 mg | ORAL_TABLET | Freq: Four times a day (QID) | ORAL | Status: DC
  Administered 2021-06-29 – 2021-07-02 (×10): 600 mg via ORAL
  Filled 2021-06-29 (×10): qty 1

## 2021-06-29 MED ORDER — TETANUS-DIPHTH-ACELL PERTUSSIS 5-2.5-18.5 LF-MCG/0.5 IM SUSY: 0.5000 mL | PREFILLED_SYRINGE | Freq: Once | INTRAMUSCULAR | Status: DC

## 2021-06-29 MED ORDER — ZOLPIDEM TARTRATE 5 MG PO TABS
5.0000 mg | ORAL_TABLET | Freq: Every evening | ORAL | Status: DC | PRN
  Administered 2021-07-02: 5 mg via ORAL
  Filled 2021-06-29: qty 1

## 2021-06-29 MED ORDER — POLYETHYLENE GLYCOL 3350 17 G PO PACK
17.0000 g | PACK | Freq: Every day | ORAL | Status: DC
  Administered 2021-06-29 – 2021-07-02 (×4): 17 g via ORAL
  Filled 2021-06-29 (×4): qty 1

## 2021-06-29 MED ORDER — CALCIUM CARBONATE ANTACID 500 MG PO CHEW
400.0000 mg | CHEWABLE_TABLET | Freq: Two times a day (BID) | ORAL | Status: AC
  Administered 2021-06-29 – 2021-06-30 (×3): 400 mg via ORAL
  Filled 2021-06-29 (×4): qty 2

## 2021-06-29 MED ORDER — ACETAMINOPHEN 325 MG PO TABS
650.0000 mg | ORAL_TABLET | ORAL | Status: DC | PRN
  Administered 2021-06-29 – 2021-07-02 (×4): 650 mg via ORAL
  Filled 2021-06-29 (×5): qty 2

## 2021-06-29 MED ORDER — ONDANSETRON HCL 4 MG PO TABS: 4.0000 mg | ORAL_TABLET | ORAL | Status: DC | PRN

## 2021-06-29 MED ORDER — MAGNESIUM SULFATE 2 GM/50ML IV SOLN: 2.0000 g | Freq: Once | INTRAVENOUS | Status: DC

## 2021-06-29 MED ORDER — SIMETHICONE 80 MG PO CHEW: 80.0000 mg | CHEWABLE_TABLET | ORAL | Status: DC | PRN

## 2021-06-29 MED ORDER — DIPHENHYDRAMINE HCL 25 MG PO CAPS: 25.0000 mg | ORAL_CAPSULE | Freq: Four times a day (QID) | ORAL | Status: DC | PRN

## 2021-06-29 NOTE — Lactation Note (Signed)
This note was copied from a baby's chart. Lactation Consultation Note  Patient Name: Girl rhyder bratz ELFYB'O Date: 06/29/2021 Reason for consult: Initial assessment;NICU baby;Preterm <34wks;Infant < 6lbs;Primapara;1st time breastfeeding Age:36 hours  Used [place] interpreter services for Arabic, interpreter # [phone] "Lord".  Visited with mom of 6 hours old pre-term female, she's a P1 and reports minimal breast changes during the pregnancy. LC assisted mom with hand expression (no colostrum yet) and got her initiated with pumping, she started pumping during [facility] consultation.  Reviewed pumping schedule, expectations and lactogenesis II. LC also explained to mom that she'll need a DEBP after her discharge, will send a [facility] referral to [facility]; she probably qualifies for the program.  Plan of Care:  Encouraged mom to start pumping every 3 hours Hand expression and breast massage were also encouraged prior pumping  BF brochure, BF resources and NICU booklet were reviewed. No support person at this time; all questions and concerns answered, mom will call NICU LC PRN.  Maternal Data Has patient been taught Hand Expression?: Yes Does the patient have breastfeeding experience prior to this delivery?: No  Feeding Mother's Current Feeding Choice: Breast Milk  Lactation Tools Discussed/Used Tools: Flanges;Pump Flange Size: 24 Breast pump type: Double-Electric Breast Pump Pump Education: Setup, frequency, and cleaning Reason for Pumping: pre-term infant in NICU Pumping frequency: q 3 hours (recommended) Pumped volume: 0 mL  Interventions Interventions: Breast feeding basics reviewed;Breast massage;Hand express;DEBP  Discharge Pump: DEBP WIC Program: No  Consult Status Consult Status: Follow-up Follow-up type: In-patient    Jamus Loving Venetia Constable 06/29/2021, 8:38 PM

## 2021-06-29 NOTE — Discharge Summary (Signed)
Postpartum Discharge Summary      Patient Name: Kristin Clay DOB: [birth year] MRN: [id number]  Date of admission: 06/23/2021 Delivery date:06/29/2021  Delivering provider: Aletha Halim  Date of discharge: 07/02/2021  Admitting diagnosis: Chronic hypertension with superimposed preeclampsia [O11.9] Intrauterine pregnancy: [redacted]w[redacted]d    Secondary diagnosis:  Principal Problem:   Chronic hypertension with superimposed severe preeclampsia Active Problems:   Female circumcision   Language barrier   Type 1 diabetes mellitus with ketoacidosis, uncontrolled ([place]   Supervision of high risk pregnancy, antepartum   Rh negative status during pregnancy   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 in third trimester   Poor fetal growth complicating pregnancy, antepartum, third trimester, other fetus   Vaginal delivery   Obstetric vaginal laceration with type 3c third degree perineal laceration  Additional problems: None    Discharge diagnosis: Preterm Pregnancy Delivered, Preeclampsia (severe), and Type 1 diabetes                                               Post partum procedures: Magnesium sulfate for eclampsia p Augmentation: AROM, Pitocin, Cytotec, and IP Foley Complications: None  Hospital course: Induction of Labor With Vaginal Delivery   35y.o. yo G2P0111 at 365w6das admitted to the hospital 06/23/2021 for induction of labor.  Indication for induction: Severe Preeclampsia and Type 1 diabetes.  Patient had an uncomplicated labor course as follows: Membrane Rupture Time/Date: 11:11 AM ,06/28/2021   Delivery Method:Vaginal, Forceps  Episiotomy: Right Mediolateral  Lacerations:  3rd degree;Perineal  Details of delivery can be found in separate delivery note.  Patient received intrapartum and postpartum magnesium sulfate for eclampsia prophylaxis as per protocol. BP control was obtained with Procardia XL, there were no further immediate complications. T1DM control was  initially on Endotool, transitioned to NPH and Regular insulinwith the help of DM coordinators. Otherwise, patient had a routine postpartum course. Patient is discharged home in stable condition on 07/02/2021, and will follow up in the office for BP check in one week.  Newborn Data: Birth date:06/29/2021  Birth time:1:48 PM  Gender:Female  Living status:Living  Apgars:7 ,8  Weight:1860 g   Magnesium Sulfate received: Yes: Seizure prophylaxis BMZ received: Yes Rhophylac:Yes MMR:N/A T-DaP:Given prenatally Flu: N/A Transfusion:No  Physical exam  Vitals:   07/01/21 2231 07/02/21 0623 07/02/21 0800 07/02/21 1103  BP: 136/79 138/85 138/83 137/84  Pulse: 87 91 80 87  Resp:  '17 18 18  ' Temp:  98.1 F (36.7 C) 98.1 F (36.7 C) 98.5 F (36.9 C)  TempSrc:  Oral Oral Oral  SpO2:  99% 99% 99%  Weight:      Height:       General: alert, cooperative, and no distress Lochia: appropriate Uterine Fundus: firm DVT Evaluation: No evidence of DVT seen on physical exam.  Negative Homan's sign. No cords or calf tenderness. Labs: Lab Results  Component Value Date   WBC 5.6 07/01/2021   HGB 9.1 (L) 07/01/2021   HCT 28.5 (L) 07/01/2021   MCV 89.9 07/01/2021   PLT 198 07/01/2021   CMP Latest Ref Rng & Units 07/01/2021  Glucose 70 - 99 mg/dL 141(H)  BUN 6 - 20 mg/dL 8  Creatinine 0.44 - 1.00 mg/dL 0.52  Sodium 135 - 145 mmol/L 135  Potassium 3.5 - 5.1 mmol/L 4.1  Chloride 98 - 111 mmol/L 105  CO2  22 - 32 mmol/L 23  Calcium 8.9 - 10.3 mg/dL 7.6(L)  Total Protein 6.5 - 8.1 g/dL 5.3(L)  Total Bilirubin 0.3 - 1.2 mg/dL 0.4  Alkaline Phos 38 - 126 U/L 80  AST 15 - 41 U/L 17  ALT 0 - 44 U/L 13   Edinburgh Score: No flowsheet data found.   After visit meds:  Allergies as of 07/02/2021       Reactions   Pork-derived Products    Pt does not want any pork products        Medication List     STOP taking these medications    aspirin 81 MG chewable tablet   insulin aspart 100  UNIT/ML FlexPen Commonly known as: NOVOLOG   insulin NPH Human 100 UNIT/ML injection Commonly known as: NOVOLIN N   Insulin Syringe-Needle U-100 30G X 15/64" 0.5 ML Misc   labetalol 100 MG tablet Commonly known as: NORMODYNE   pantoprazole 40 MG tablet Commonly known as: Protonix       TAKE these medications    blood glucose meter kit and supplies Dispense based on patient and insurance preference. Use up to four times daily as directed. (FOR ICD-10 E10.9, E11.9).   cyclobenzaprine 5 MG tablet Commonly known as: FLEXERIL Take 1 tablet (5 mg total) by mouth at bedtime as needed for muscle spasms.   FeroSul 325 (65 FE) MG tablet Generic drug: ferrous sulfate Take 1 tablet (325 mg total) by mouth daily with breakfast. Start taking on: July 03, 2021   glucose blood test strip Use as instructed QID   HumuLIN 70/30 KwikPen (70-30) 100 UNIT/ML KwikPen Generic drug: insulin isophane & regular human KwikPen Inject 8 Units into the skin 2 (two) times daily.   ibuprofen 600 MG tablet Commonly known as: ADVIL Take 1 tablet (600 mg total) by mouth every 6 (six) hours.   NIFEdipine 60 MG 24 hr tablet Commonly known as: ADALAT CC Take 1 tablet (60 mg total) by mouth 2 (two) times daily.   OneTouch Delica Lancets 48A Misc 1 Device by Does not apply route in the morning, at noon, in the evening, and at bedtime.   Pentips 32G X 4 MM Misc Generic drug: Insulin Pen Needle Use as directed What changed:  how much to take how to take this when to take this additional instructions   Prenatal Vitamin 27-0.8 MG Tabs Take 1 tablet by mouth daily.               Discharge Care Instructions  (From admission, onward)           Start     Ordered   07/02/21 0000  Discharge wound care:       Comments: As per discharge handout and nursing instructions   07/02/21 1054             Discharge home in stable condition Infant Feeding: Bottle and Breast Infant  Disposition:NICU Discharge instruction: per After Visit Summary and Postpartum booklet. Activity: Advance as tolerated. Pelvic rest for 6 weeks.  Diet: routine diet Future Appointments: Future Appointments  Date Time Provider [place]  07/10/2021 10:15 AM Griffin Basil, MD [facility]  07/24/2021  9:40 AM Dorna Mai, MD [facility] None  08/04/2021  8:35 AM Aletha Halim, MD [facility]   Follow up Visit:  Follow-up Information     [facility] Follow up.   Why: appointment for hospital follow up and PCP establishment on Sept 1st Thursday at  9:40 am- arrive at 9:30 am with Dr. Redmond Pulling.   phone#[phone] Contact information: [address] [place] [address]               Message sent to [facility] by Dr. Cy Blamer on 06/29/2021  Please schedule this patient for a In person postpartum visit in 4 weeks with the following provider: MD. Additional Postpartum F/U:Incision check 1 week with Dr. Ilda Basset or any MD High risk pregnancy complicated by:  preEclampsia and Type 1 diabetes Delivery mode:  Vaginal, Forceps  Anticipated Birth Control:  Unsure   07/02/2021 Verita Schneiders, MD

## 2021-06-29 NOTE — Progress Notes (Signed)
Pt found involuntarily pushing. SVE found patient to be 6cm/ 100/ +1,+2. RN assessed epidural level with ice and found to be appropriate. Provider called to room to evaluate for potential delivery.

## 2021-06-29 NOTE — Lactation Note (Signed)
This note was copied from a baby's chart. Lactation Consultation Note  Patient Name: Kristin Clay VDIXV'E Date: 06/29/2021   Age:36 hours  Attempted to visit with mom again but she was having a low sugar episode, LC will try to check in later, Day shift RN Earnest Bailey has already brought the DEBP and pump kit in the room to set it up.  [place] 06/29/2021, 7:25 PM

## 2021-06-29 NOTE — Progress Notes (Signed)
OB notified of hypoglycemic event. RN requested guidance on continuing D5 LR at 57ml/hr. pt did eat dinner. per OB continue with D5 LR. continue to monitor. follow endotool

## 2021-06-29 NOTE — Lactation Note (Signed)
This note was copied from a baby's chart. Lactation Consultation Note  Patient Name: Kristin Clay Date: 06/29/2021   Age:36 hours  Attempted to visit with mom, but L&D RN Zharia B. reported to [facility] that mom was getting a repair and she'd prefer to be seen once she's in her room on the 1st floor. Baby is already in the NICU, will F/U later today for initial assessment.    Usiel Astarita S Eh Sesay 06/29/2021, 2:18 PM

## 2021-06-29 NOTE — Progress Notes (Signed)
adell koval MRN: [id number]  Subjective: -Patient resting in bed.  Reports comfort with epidural.  Questions when delivery will occur.   Objective: BP 124/87   Pulse 76   Temp 97.8 F (36.6 C) (Oral)   Resp 16   Ht 5' (1.524 m)   Wt 49.7 kg   LMP 10/22/2020   SpO2 100%   BMI 21.40 kg/m  I/O last 3 completed shifts: In: 7041.6 [P.O.:1967; I.V.:4189.2; Other:85.4; IV Piggyback:800] Out: 7400 [Urine:7400] Total I/O In: 1524.1 [P.O.:200; I.V.:1210.1; Other:114] Out: 1750 [Urine:1750]  Fetal Monitoring: FHT: 115 bpm, Mod Var, -Decels, +Accels UC: Palpates mild    Vaginal Exam: SVE:   Dilation: 3 Effacement (%): 50 Station: -3, -2 Exam by:: J.Shanah Guimaraes, CNM Membranes:Forebag ruptured with large amt clear fluid Internal Monitors: None  Augmentation/Induction: Pitocin:61mUn/min Cytotec: S/P  Assessment:  IUP at 33.6 weeks Cat I FT  IOL Amniotomy  Plan: -Discussed AROM r/b including increased risk of infection, cord prolapse, fetal intolerance, and decreased labor time. No questions or concerns.  Patient agreeable with AROM.  -Provider informs patient that delivery can occur at anytime. -Encouraged rest until said time. -Will continue to monitor.  -Continue other mgmt as ordered   Valma Cava, CNM Advanced Practice Provider, Center for [facility] : [facility] 06/29/2021, 4:33 AM

## 2021-06-29 NOTE — Progress Notes (Signed)
Labor Progress Note Kristin Clay is a 35 y.o. G2P0010 at [redacted]w[redacted]d presented for IOL due to cHTN with SIPE + severe features S:  Called to room by RN due to patient feeling urge to push and on CVE 6/100/+2. Went to evaluate patient and got interpreter on video. Discussed with patient that she was not dilated enough to push yet and needed to breath through her contractions. Patient has adequate anesthesia so discussed not increasing so that patient would feel pressure as needed  O:  BP 131/89   Pulse 79   Temp 97.7 F (36.5 C) (Oral)   Resp 16   Ht 5' (1.524 m)   Wt 49.7 kg   LMP 10/22/2020   SpO2 100%   BMI 21.40 kg/m  EFM: 125bpm/moderate variability/+ accels, no decels Toco: q2-4 min  CVE: Dilation: 5 Effacement (%): 90, 100 Cervical Position: Posterior Station: Plus 1, Plus 2 Presentation: Vertex Exam by:: Danielle  CNM   A&P: 35 y.o. G2P0010 [redacted]w[redacted]d IOL due to cHTN with SIPE + severe features #Labor: S/p Cytotec x 5, Foley bulb and AROM and pitocin. Progressing well. Reassured patient that she will feel pressure but not to push as she is not dilated enough to safely deliver yet #Pain: Epidural in place #FWB: Cat 1 #GBS positive > PCN #cHTN SI PreE w/ SF:  -On Mag, normal to mild range BP since last check. Asymptomatic             -q shift labs             -Continue Labetolol 400mg  TID             -Hydralazine PRN             -tylenol PRN #Hypocalcemia: Ca 6.4, started Tums 400mg  elemental Ca BID for 4 doses with repeat labs tonight #T1DM: Endotool             -Last CBG: 95 #Female Circumcision: Discomfort with cervical checks, narrow introitus, may need deinfibulation at delivery, epidural in place #Rh Neg: Plan for Rh evaluation after delivery   , MD 11:39 AM

## 2021-06-29 NOTE — Progress Notes (Signed)
Kristin Clay is a 35 y.o. G2P0010 at [redacted]w[redacted]d by ultrasound admitted for induction of labor due to preE with severe features and type 1 diabetes.  Subjective: Reports feels some back pressure during contractions. Otherwise feels epidural is helping.  Objective: BP 130/87   Pulse 78   Temp 97.8 F (36.6 C) (Oral)   Resp 16   Ht 5' (1.524 m)   Wt 49.7 kg   LMP 10/22/2020   SpO2 100%   BMI 21.40 kg/m  I/O last 3 completed shifts: In: 8537.5 [P.O.:2217; I.V.:5309.1; Other:211.4; IV Piggyback:800] Out: 9400 [Urine:9400] Total I/O In: 229.3 [I.V.:185.3; Other:44] Out: 400 [Urine:400]  FHT:  FHR: 120 bpm, variability: moderate,  accelerations:  Present,  decelerations:  Absent UC:   regular, every 3-5 minutes SVE:   Dilation: 5 Effacement (%): 90 Station: -3, -2 Exam by:: Dr. Ephriam Jenkins  Labs: Lab Results  Component Value Date   WBC 4.7 06/29/2021   HGB 12.6 06/29/2021   HCT 38.1 06/29/2021   MCV 89.0 06/29/2021   PLT 211 06/29/2021    Assessment / Plan: Induction of labor due to preeclampsia and Type 1 diabetes,  progressing well on pitocin  Labor:  on Pitocin and progressing, SROM and forebag ruptured x2, placing IUPC now Preeclampsia:  on magnesium sulfate, no signs or symptoms of toxicity, and repeating labs this morning, will follow up. Fetal Wellbeing:  Category I Pain Control:  Epidural I/D:   GBS positive - on PCN Anticipated MOD:  NSVD  Warner Mccreedy, MD, MPH OB Fellow, Faculty Practice

## 2021-06-29 NOTE — Progress Notes (Signed)
Kristin Clay MRN: [id number]  Subjective: -Patient asleep in bed.  SO at bedside.  Provider did not attempt to awaken.   Objective: BP 117/82   Pulse 78   Temp 97.8 F (36.6 C) (Oral)   Resp 16   Ht 5' (1.524 m)   Wt 49.7 kg   LMP 10/22/2020   SpO2 100%   BMI 21.40 kg/m  I/O last 3 completed shifts: In: 7041.6 [P.O.:1967; I.V.:4189.2; Other:85.4; IV Piggyback:800] Out: 7400 [Urine:7400] Total I/O In: 1404 [P.O.:200; I.V.:1102; Other:102] Out: 1550 [Urine:1550]  Fetal Monitoring: FHT: 110 bpm, Mod Var, -Decels, -Accels UC: Q2-49min, palpates mild to moderate    Vaginal Exam: SVE:   Dilation: 3 Effacement (%): 50 Station: -3, -2 Exam by:: J.Overton Boggus, CNM Membranes:SROM x 13 hrs Internal Monitors: None  Augmentation/Induction: Pitocin:19mUn/min Cytotec: S/P  Assessment:  IUP at 33.6 weeks Cat I FT  IOL T1DM  Plan: -BS 101; Nurse to manage per Endotool. -Encouraged to titrate pitocin as appropriate. -Continue other mgmt as ordered.   Valma Cava, CNM Advanced Practice Provider, Center for [facility] 06/29/2021, 3:02 AM

## 2021-06-30 ENCOUNTER — Inpatient Hospital Stay (HOSPITAL_COMMUNITY): Admission: AD | Admit: 2021-06-30 | Payer: 59 | Source: Home / Self Care | Admitting: Family Medicine

## 2021-06-30 ENCOUNTER — Inpatient Hospital Stay (HOSPITAL_COMMUNITY): Payer: 59

## 2021-06-30 ENCOUNTER — Ambulatory Visit: Payer: 59

## 2021-06-30 ENCOUNTER — Encounter (HOSPITAL_COMMUNITY)
Admission: AD | Disposition: A | Discharge status: Home / Self Care | Payer: Self-pay | Source: Home / Self Care | Attending: Obstetrics and Gynecology

## 2021-06-30 ENCOUNTER — Ambulatory Visit: Payer: Self-pay

## 2021-06-30 LAB — GLUCOSE, CAPILLARY
Glucose-Capillary: 102 mg/dL — ABNORMAL HIGH (ref 70–99)
Glucose-Capillary: 122 mg/dL — ABNORMAL HIGH (ref 70–99)
Glucose-Capillary: 124 mg/dL — ABNORMAL HIGH (ref 70–99)
Glucose-Capillary: 125 mg/dL — ABNORMAL HIGH (ref 70–99)
Glucose-Capillary: 133 mg/dL — ABNORMAL HIGH (ref 70–99)
Glucose-Capillary: 133 mg/dL — ABNORMAL HIGH (ref 70–99)
Glucose-Capillary: 133 mg/dL — ABNORMAL HIGH (ref 70–99)
Glucose-Capillary: 142 mg/dL — ABNORMAL HIGH (ref 70–99)
Glucose-Capillary: 143 mg/dL — ABNORMAL HIGH (ref 70–99)
Glucose-Capillary: 147 mg/dL — ABNORMAL HIGH (ref 70–99)
Glucose-Capillary: 74 mg/dL (ref 70–99)
Glucose-Capillary: 76 mg/dL (ref 70–99)

## 2021-06-30 SURGERY — Surgical Case
Anesthesia: Regional

## 2021-06-30 MED ORDER — INSULIN ASPART 100 UNIT/ML IJ SOLN: 0.0000 [IU] | Freq: Every day | INTRAMUSCULAR | Status: DC

## 2021-06-30 MED ORDER — OXYCODONE HCL 5 MG PO TABS
5.0000 mg | ORAL_TABLET | Freq: Four times a day (QID) | ORAL | Status: DC | PRN
  Administered 2021-06-30: 5 mg via ORAL
  Filled 2021-06-30: qty 1

## 2021-06-30 MED ORDER — INSULIN ASPART 100 UNIT/ML IJ SOLN
2.0000 [IU] | Freq: Three times a day (TID) | INTRAMUSCULAR | Status: DC
  Administered 2021-06-30 – 2021-07-02 (×6): 2 [IU] via SUBCUTANEOUS

## 2021-06-30 MED ORDER — INSULIN ASPART 100 UNIT/ML IJ SOLN
0.0000 [IU] | Freq: Three times a day (TID) | INTRAMUSCULAR | Status: DC
  Administered 2021-07-01: 1 [IU] via SUBCUTANEOUS

## 2021-06-30 MED ORDER — RHO D IMMUNE GLOBULIN 1500 UNIT/2ML IJ SOSY
300.0000 ug | PREFILLED_SYRINGE | Freq: Once | INTRAMUSCULAR | Status: AC
  Administered 2021-06-30: 300 ug via INTRAVENOUS
  Filled 2021-06-30: qty 2

## 2021-06-30 MED ORDER — INSULIN NPH (HUMAN) (ISOPHANE) 100 UNIT/ML ~~LOC~~ SUSP
6.0000 [IU] | Freq: Two times a day (BID) | SUBCUTANEOUS | Status: DC
  Administered 2021-06-30 – 2021-07-02 (×4): 6 [IU] via SUBCUTANEOUS
  Filled 2021-06-30: qty 10

## 2021-06-30 NOTE — Lactation Note (Signed)
This note was copied from a baby's chart. Lactation Consultation Note  Patient Name: Kristin Clay [id number] Date: 06/30/2021 Reason for consult: Follow-up assessment;NICU baby;Primapara;1st time breastfeeding;Infant < 6lbs;Late-preterm 34-36.6wks Age:36 hours  Used [place] interpreter services for arabic, interpreter # [phone] "Bouchra"  Visited with mom of 26 hours old 34 0/7 (adjusted) NICU female, she's a P1 and has been pumping since yesterday but not consistently. There is language barrier with mom, even though this LC calls the interpreter and explains that her milk will take at least 3-5 days to come in, mom keeps making a point that she doesn't have "any milk yet".  Reviewed pumping expectations again, breastmilk storage guidelines and breast pump parts cleaning, LC assisted with washing pump parts, mom hasn't been doing so, explained to her she needs to wash after every use.  Plan of Care:   Encouraged mom to start pumping consistently every 3 hours x 15 minutes at a time Hand expression, breast massage and coconut oil were also encouraged prior pumping   No support person at this time; all questions and concerns answered, mom will call NICU LC PRN.  Maternal Data    Feeding Mother's Current Feeding Choice: Breast Milk  Lactation Tools Discussed/Used Tools: Pump;Flanges;Coconut oil Flange Size: 21 Breast pump type: Double-Electric Breast Pump Pump Education: Setup, frequency, and cleaning;Milk Storage Reason for Pumping: LPI < 5 lbs in NICU Pumping frequency: 5 times/24 hours Pumped volume: 0 mL  Interventions Interventions: Breast feeding basics reviewed;DEBP;Education;Breast massage;Hand express  Discharge Pump: DEBP  Consult Status Consult Status: Follow-up Follow-up type: In-patient    Kristin Clay 06/30/2021, 4:23 PM

## 2021-06-30 NOTE — Progress Notes (Signed)
Daily Postpartum Note  Admission Date: 06/23/2021 Current Date: 06/30/2021 7:44 AM  Kristin Clay is a 35 y.o. [id number] PPD#1 low FAVD/3c @ [redacted]w[redacted]d, admitted for severe pre-eclampsia superimposed on cHTN.  Pregnancy complicated by: Patient Active Problem List   Diagnosis Date Noted   Vaginal delivery 06/29/2021   Third degree laceration of perineum during delivery, postpartum 06/29/2021   Chronic hypertension with superimposed severe preeclampsia 06/23/2021   Poor fetal growth complicating pregnancy, antepartum, third trimester, other fetus 06/19/2021   GBS bacteriuria 06/09/2021   UTI (urinary tract infection) during pregnancy 05/25/2021   Supervision of high risk pregnancy, antepartum 05/01/2021   Rh negative status during pregnancy 05/01/2021   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 in third trimester 05/01/2021   History of diabetic ketoacidosis 05/01/2021   Late prenatal care affecting pregnancy in second trimester 05/01/2021   Type 1 diabetes mellitus with ketoacidosis, uncontrolled (HCC) 02/22/2020   Language barrier 01/11/2020   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 +voiding w/o issue. No BM. +ambulation with assistance, lochia normal.   Patient having some low back pain, she did have an epidural.   Objective:     Current Vital Signs 24h Vital Sign Ranges  T 98 F (36.7 C) Temp  Avg: 97.8 F (36.6 C)  Min: 97.5 F (36.4 C)  Max: 98 F (36.7 C)  BP 108/60 BP  Min: 96/50  Max: 154/111  HR 80 Pulse  Avg: 78.1  Min: 70  Max: 85  RR 18 Resp  Avg: 17.7  Min: 16  Max: 20  SaO2 100 % Room Air SpO2  Avg: 99.9 %  Min: 99 %  Max: 100 %       24 Hour I/O Current Shift I/O  Time Ins Outs 08/07 0701 - 08/08 0700 In: 3608.4 [P.O.:1320; I.V.:1276] Out: 4150 [Urine:3750] No intake/output data recorded.   Patient Vitals for the past 24 hrs:  BP Temp Temp src Pulse Resp SpO2  06/30/21  0500 108/60 -- -- 80 18 --  06/30/21 0429 -- -- -- -- -- 100 %  06/30/21 0428 (!) 96/50 98 F (36.7 C) Oral 84 18 --  06/30/21 0017 103/68 98 F (36.7 C) Oral 81 18 --  06/29/21 2140 101/66 98 F (36.7 C) Axillary 80 18 --  06/29/21 1920 -- -- -- -- 17 --  06/29/21 1904 -- -- -- -- -- 100 %  06/29/21 1859 -- -- -- -- -- 100 %  06/29/21 1854 -- -- -- -- -- 100 %  06/29/21 1850 -- -- -- -- 18 100 %  06/29/21 1844 -- -- -- -- -- 100 %  06/29/21 1839 -- -- -- -- -- 100 %  06/29/21 1834 -- -- -- -- -- 100 %  06/29/21 1829 -- -- -- -- -- 100 %  06/29/21 1824 -- -- -- -- -- 100 %  06/29/21 1819 -- -- -- -- -- 100 %  06/29/21 1814 -- -- -- -- -- 100 %  06/29/21 1809 -- -- -- -- -- 100 %  06/29/21 1804 -- -- -- -- -- 100 %  06/29/21 1800 -- -- -- -- -- 100 %  06/29/21 1755 130/83 -- -- 78 17 100 %  06/29/21 1659 -- -- -- -- -- 100 %  06/29/21 1658 137/90 (!) 97.5 F (36.4 C) Axillary 78 18 --  06/29/21 1630 (!) 141/95 -- -- 79 -- --  06/29/21 1615  116/70 -- -- 79 -- --  06/29/21 1600 125/79 -- -- 71 -- --  06/29/21 1545 116/79 -- -- 70 -- --  06/29/21 1530 116/78 -- -- 73 -- --  06/29/21 1515 112/75 -- -- 74 20 --  06/29/21 1500 120/81 97.6 F (36.4 C) Oral 73 -- --  06/29/21 1445 112/76 -- -- 74 -- --  06/29/21 1430 111/79 -- -- 74 -- --  06/29/21 1415 109/78 -- -- 77 -- --  06/29/21 1403 116/84 -- -- 80 18 --  06/29/21 1353 108/77 -- -- 78 -- --  06/29/21 1340 -- -- -- -- -- 99 %  06/29/21 1335 -- -- -- -- -- 100 %  06/29/21 1330 (!) 138/95 -- -- 82 -- --  06/29/21 1300 130/89 -- -- 81 20 --  06/29/21 1250 -- -- -- -- -- 99 %  06/29/21 1200 123/87 97.8 F (36.6 C) -- 84 -- --  06/29/21 1130 131/89 -- -- 79 -- --  06/29/21 1116 126/84 -- -- 80 -- --  06/29/21 1100 (!) 154/111 -- -- 85 -- --  06/29/21 1030 (!) 137/109 -- -- 83 -- --  06/29/21 1000 129/88 97.7 F (36.5 C) Oral 79 16 --  06/29/21 0930 (!) 138/94 -- -- 80 16 --  06/29/21 0900 130/87 -- -- 78 16 --  06/29/21  0830 127/86 -- -- 78 -- --  06/29/21 0800 120/86 -- -- 71 -- --  UOP: adequate  Physical exam: General: Well nourished, well developed female in no acute distress. Abdomen: nttp, firm fundus below the umbilicus Back: minimally ttp in the L4-5 area, no signs of infection.  Cardiovascular: S1, S2 normal, no murmur, rub or gallop, regular rate and rhythm Respiratory: CTAB Extremities: no clubbing, cyanosis or edema Skin: Warm and dry.   Medications: Current Facility-Administered Medications  Medication Dose Route Frequency Provider Last Rate Last Admin   acetaminophen (TYLENOL) tablet 650 mg  650 mg Oral Q4H PRN Nelson Chimes, MD   650 mg at 06/30/21 0530   benzocaine-Menthol (DERMOPLAST) 20-0.5 % topical spray 1 application  1 application Topical PRN Nelson Chimes, MD       calcium carbonate (TUMS - dosed in mg elemental calcium) chewable tablet 400 mg of elemental calcium  400 mg of elemental calcium Oral BID Nelson Chimes, MD   400 mg of elemental calcium at 06/29/21 1609   coconut oil  1 application Topical PRN Nelson Chimes, MD       dextrose 5 % in lactated ringers infusion   Intravenous Continuous Nelson Chimes, MD 75 mL/hr at 06/29/21 1733 New Bag at 06/29/21 1733   dextrose 50 % solution 0-50 mL  0-50 mL Intravenous PRN Nelson Chimes, MD   20 mL at 06/29/21 1854   witch hazel-glycerin (TUCKS) pad 1 application  1 application Topical PRN Nelson Chimes, MD       And   dibucaine (NUPERCAINAL) 1 % rectal ointment 1 application  1 application Rectal PRN Nelson Chimes, MD       diphenhydrAMINE (BENADRYL) capsule 25 mg  25 mg Oral Q6H PRN Nelson Chimes, MD       ferrous sulfate tablet 325 mg  325 mg Oral Q breakfast Nelson Chimes, MD       hydrALAZINE (APRESOLINE) injection 5 mg  5 mg Intravenous PRN Nelson Chimes, MD   5 mg at 06/27/21 5956   And   hydrALAZINE (APRESOLINE) injection 10 mg  10 mg Intravenous PRN Nelson Chimes, MD  10 mg at 06/26/21  0345   And   labetalol (NORMODYNE) injection 20 mg  20 mg Intravenous PRN Nelson Chimes, MD       And   labetalol (NORMODYNE) injection 40 mg  40 mg Intravenous PRN Nelson Chimes, MD       ibuprofen (ADVIL) tablet 600 mg  600 mg Oral Q6H Nelson Chimes, MD   600 mg at 06/30/21 0530   insulin regular, human (MYXREDLIN) 100 units/ 100 mL infusion   Intravenous Continuous Nelson Chimes, MD 1 mL/hr at 06/30/21 0718 1 Units/hr at 06/30/21 9798   lactated ringers infusion   Intravenous Continuous [place] Bing, MD 75 mL/hr at 06/29/21 1719 New Bag at 06/29/21 1719   magnesium sulfate 40 grams in SWI 1000 mL OB infusion  2 g/hr Intravenous Titrated [place] Bing, MD 50 mL/hr at 06/30/21 0616 2 g/hr at 06/30/21 0616   ondansetron (ZOFRAN) tablet 4 mg  4 mg Oral Q4H PRN Nelson Chimes, MD       Or   ondansetron [facility]) injection 4 mg  4 mg Intravenous Q4H PRN Nelson Chimes, MD       oxyCODONE (Oxy IR/ROXICODONE) immediate release tablet 5 mg  5 mg Oral Q6H PRN [place] Bing, MD       pantoprazole (PROTONIX) EC tablet 40 mg  40 mg Oral Daily Nelson Chimes, MD   40 mg at 06/28/21 1545   polyethylene glycol (MIRALAX / GLYCOLAX) packet 17 g  17 g Oral Daily Nelson Chimes, MD   17 g at 06/29/21 2209   prenatal multivitamin tablet 1 tablet  1 tablet Oral Q1200 Nelson Chimes, MD       senna-docusate (Senokot-S) tablet 2 tablet  2 tablet Oral Daily Nelson Chimes, MD       simethicone [facility]) chewable tablet 80 mg  80 mg Oral PRN Nelson Chimes, MD       Tdap Leda Min) injection 0.5 mL  0.5 mL Intramuscular Once Nelson Chimes, MD       zolpidem (AMBIEN) tablet 5 mg  5 mg Oral QHS PRN Nelson Chimes, MD        Labs:  Recent Labs  Lab 06/28/21 [phone] 06/29/21 0813 06/29/21 1457  WBC 5.5 4.7 5.6  HGB 13.2 12.6 11.3*  HCT 39.7 38.1 35.4*  PLT 246 211 211    Recent Labs  Lab 06/27/21 1224 06/28/21 0019 06/29/21 0813  NA 136 134* 131*  K 3.8 3.9 4.4  CL  105 107 103  CO2 22 20* 19*  BUN 9 7 6   CREATININE 0.40* 0.34* 0.46  CALCIUM 8.6* 7.6* 6.4*  PROT 5.9* 6.1* 6.1*  BILITOT 0.6 0.6 1.2  ALKPHOS 89 91 101  ALT 11 10 12   AST 15 15 15   GLUCOSE 71 83 93    Results for ISLAM, VILLESCAS (MRN ) as of 06/30/2021 07:48  Ref. Range 06/29/2021 23:10 06/30/2021 00:05 06/30/2021 01:16 06/30/2021 02:19 06/30/2021 03:24  Glucose-Capillary Latest Ref Range: 70 - 99 mg/dL 08/30/2021 (H) 08/30/2021 (H) 08/30/2021 (H) 124 (H) 143 (H)   Radiology:  No new imaging  Assessment & Plan:  Pt stable *PP: A NEG, rhogam ordered. Breast. D/w pt more re: BC *CV: continue Mg until early this afternoon. Pt off BP meds. Continue to follow. *3c laceration: qday miralax. Needs 7-10d f/u in person MD visit and can set up 4-6wk pelvic floor PT visit then *DM1: continue endotool and f/u DM education for transitioning off gtt today *PPx: SCDs, *FEN/GI: DM  diet.  *Dispo: likely PPD#2  Interpreter used  Cornelia Copaharlie Kayliegh Boyers, Jr MD Attending Center for [facility](Faculty Practice) GYN Consult Phone: [phone] (M-F, 0800-1700) & [phone]  (Off hours, weekends, holidays)

## 2021-06-30 NOTE — Progress Notes (Addendum)
Inpatient Diabetes Program Recommendations  AACE/ADA: New Consensus Statement on Inpatient Glycemic Control (2015)  Target Ranges:  Prepandial:   less than 140 mg/dL      Peak postprandial:   less than 180 mg/dL (1-2 hours)      Critically ill patients:  140 - 180 mg/dL   Lab Results  Component Value Date   GLUCAP 122 (H) 06/30/2021   HGBA1C 8.4 (H) 05/01/2021    Review of Glycemic Control Results for JONESHA, TSUCHIYA (MRN [id number]) as of 06/30/2021 09:07  Ref. Range 06/30/2021 02:19 06/30/2021 03:24 06/30/2021 07:16 06/30/2021 08:19  Glucose-Capillary Latest Ref Range: 70 - 99 mg/dL 357 (H) 017 (H) 793 (H) 122 (H)   Diabetes history: Type 1 DM (requires basal and meal coverage) Outpatient Diabetes medications: NPH 12 units BID, Novolog 5 units TID Current orders for Inpatient glycemic control: IV insulin   Inpatient Diabetes Program Recommendations:    When ready to transition, consider: - NPH 6 units two hours prior to discontinuation of IV insulin, then BID to follow -Novolog 0-6 units TID & HS -Novolog 2 units TID (assuming patient is consuming >50% of meals)  Addendum: Attempted to speak with patient x2, secure chat sent to RN. Will reattempt during hospitalization.  Thanks, Lujean Rave, MSN, RNC-OB Diabetes Coordinator [phone] (8a-5p)

## 2021-06-30 NOTE — Anesthesia Postprocedure Evaluation (Signed)
Anesthesia Post Note  Patient: Kristin Clay  Procedure(s) Performed: AN AD HOC LABOR EPIDURAL     Patient location during evaluation: OB High Risk Anesthesia Type: Epidural Level of consciousness: awake and alert Pain management: pain level controlled Vital Signs Assessment: post-procedure vital signs reviewed and stable Respiratory status: spontaneous breathing Cardiovascular status: stable Postop Assessment: no headache, adequate PO intake, no backache, patient able to bend at knees, able to ambulate, epidural receding and no apparent nausea or vomiting Anesthetic complications: no   No notable events documented.  Last Vitals:  Vitals:   06/30/21 0804 06/30/21 0805  BP:  (!) 101/55  Pulse:    Resp:  18  Temp:  36.6 C  SpO2: 99% 100%    Last Pain:  Vitals:   06/30/21 0834  TempSrc:   PainSc: 5    Pain Goal: Patients Stated Pain Goal: 3 (06/30/21 0055)                 Salome Arnt

## 2021-07-01 ENCOUNTER — Other Ambulatory Visit: Payer: Self-pay

## 2021-07-01 LAB — CBC
HCT: 28.5 % — ABNORMAL LOW (ref 36.0–46.0)
Hemoglobin: 9.1 g/dL — ABNORMAL LOW (ref 12.0–15.0)
MCH: 28.7 pg (ref 26.0–34.0)
MCHC: 31.9 g/dL (ref 30.0–36.0)
MCV: 89.9 fL (ref 80.0–100.0)
Platelets: 198 10*3/uL (ref 150–400)
RBC: 3.17 MIL/uL — ABNORMAL LOW (ref 3.87–5.11)
RDW: 13.9 % (ref 11.5–15.5)
WBC: 5.6 10*3/uL (ref 4.0–10.5)
nRBC: 0 % (ref 0.0–0.2)

## 2021-07-01 LAB — COMPREHENSIVE METABOLIC PANEL
ALT: 13 U/L (ref 0–44)
AST: 17 U/L (ref 15–41)
Albumin: 2 g/dL — ABNORMAL LOW (ref 3.5–5.0)
Alkaline Phosphatase: 80 U/L (ref 38–126)
Anion gap: 7 (ref 5–15)
BUN: 8 mg/dL (ref 6–20)
CO2: 23 mmol/L (ref 22–32)
Calcium: 7.6 mg/dL — ABNORMAL LOW (ref 8.9–10.3)
Chloride: 105 mmol/L (ref 98–111)
Creatinine, Ser: 0.52 mg/dL (ref 0.44–1.00)
GFR, Estimated: >60 mL/min (ref >60)
Glucose, Bld: 141 mg/dL — ABNORMAL HIGH (ref 70–99)
Potassium: 4.1 mmol/L (ref 3.5–5.1)
Sodium: 135 mmol/L (ref 135–145)
Total Bilirubin: 0.4 mg/dL (ref 0.3–1.2)
Total Protein: 5.3 g/dL — ABNORMAL LOW (ref 6.5–8.1)

## 2021-07-01 LAB — RH IG WORKUP (INCLUDES ABO/RH)
Fetal Screen: NEGATIVE
Gestational Age(Wks): 33
Unit division: 0

## 2021-07-01 LAB — GLUCOSE, CAPILLARY
Glucose-Capillary: 122 mg/dL — ABNORMAL HIGH (ref 70–99)
Glucose-Capillary: 200 mg/dL — ABNORMAL HIGH (ref 70–99)
Glucose-Capillary: 78 mg/dL (ref 70–99)
Glucose-Capillary: 110 mg/dL — ABNORMAL HIGH (ref 70–99)
Glucose-Capillary: 92 mg/dL (ref 70–99)

## 2021-07-01 MED ORDER — NIFEDIPINE ER OSMOTIC RELEASE 30 MG PO TB24
30.0000 mg | ORAL_TABLET | Freq: Two times a day (BID) | ORAL | Status: DC
  Administered 2021-07-01 – 2021-07-02 (×3): 30 mg via ORAL
  Filled 2021-07-01 (×3): qty 1

## 2021-07-01 NOTE — Progress Notes (Signed)
Patient screened out for psychosocial assessment since none of the following apply: Psychosocial stressors documented in mother or baby's chart Gestation less than 32 weeks Code at delivery  Infant with anomalies  Please contact the Clinical Social Worker if specific needs arise, by MOB's request, or if MOB scores greater than 9/yes to question 10 on Edinburgh Postpartum Depression Screen.  RNCM assisting with MOB's PCP needs.   Celso Sickle, LCSW Clinical Social Worker [facility] Cell#: [phone]

## 2021-07-01 NOTE — Progress Notes (Signed)
Post Partum Day 2 s/p FAVD/3c with mediolateral epis c/b CHTN with SI PreE with SF and T1DM  Subjective: up ad lib, voiding, tolerating PO, and + flatus. She notes minimal lochia. Pain is well controlled. Some lower back soreness.   Objective: Blood pressure (!) 157/82, pulse 75, temperature 97.9 F (36.6 C), temperature source Oral, resp. rate 18, height 5' (1.524 m), weight 49.7 kg, last menstrual period 10/22/2020, SpO2 100 %, unknown if currently breastfeeding.  BP range in last 12 hours: 134-157/68-93  Physical Exam:  General: alert, cooperative, appears stated age, and no distress Lochia: appropriate Uterine Fundus: firm DVT Evaluation: No evidence of DVT seen on physical exam. No cords or calf tenderness.  Recent Labs    06/29/21 1457 07/01/21 0033  HGB 11.3* 9.1*  HCT 35.4* 28.5*    Assessment/Plan: CHTN with SI PreE with SF - Was on Labetalol 400 TID prior to delivery. Since delivery normotensive until last 12 hours. Procardia 30 mg BID initiated.  - s/p Mag - Otherwise Asymptomatic   T1DM - Resumed NPH this morning after being held last night. - NPH 6 BID, Novolog 2 with meals and SSI  Routine PP Care - Rh negative - Rhogam indicated and given - FAVD with 3c laceration for PFPT after recovered with 1 wk pelvic exam in the office. Continue daily miralax - Unsure PP birth control - Bottlefeeding  Arabic speaking patient - interpreter used throughout    LOS: 8 days   Milas Hock 07/01/2021, 10:53 AM

## 2021-07-01 NOTE — Lactation Note (Signed)
This note was copied from a baby's chart. Lactation Consultation Note LC consulted with patient and FOB; interpreter assistance provided. Mom continues to pump frequently and is aware of volume norms in first days pp. FOB has 1100 to pick up [facility] pump in [place] All question / concerns addressed. Mother will continue to pump q3.   Patient Name: Kristin Clay Date: 07/01/2021 Reason for consult: Follow-up assessment Age:24 hours  Maternal Data  Pumping frequency: q3  Feeding Mother's Current Feeding Choice: Breast Milk and Formula  Interventions Interventions: Education  Consult Status Consult Status: Follow-up Follow-up type: In-patient   [name], MA IBCLC 07/01/2021, 10:44 AM

## 2021-07-01 NOTE — Progress Notes (Signed)
RN entered room for rounds, pt diaphoretic, questioned RN as to if she had fever. VS obtained, temp WNL. BP elevated above previous readings but not severe range. Denies headache, visual disturbances, or epigastric pain. Cool cloth provided. Pt denied other needs. Elwyn Reach, RN

## 2021-07-01 NOTE — Progress Notes (Signed)
Inpatient Diabetes Program Recommendations  AACE/ADA: New Consensus Statement on Inpatient Glycemic Control (2015)  Target Ranges:  Prepandial:   less than 140 mg/dL      Peak postprandial:   less than 180 mg/dL (1-2 hours)      Critically ill patients:  140 - 180 mg/dL   Lab Results  Component Value Date   GLUCAP 200 (H) 07/01/2021   HGBA1C 8.4 (H) 05/01/2021    Review of Glycemic Control Results for YAJAYRA, FELDT (MRN [id number]) as of 07/01/2021 08:49  Ref. Range 06/30/2021 13:15 06/30/2021 18:42 06/30/2021 21:48 07/01/2021 05:20 07/01/2021 08:38  Glucose-Capillary Latest Ref Range: 70 - 99 mg/dL 74 948 (H) 76 546 (H) 270 (H)   Diabetes history: Type 1 DM (requires basal and meal coverage) Outpatient Diabetes medications: NPH 12 units BID, Novolog 5 units TID Pre-pregnant- Novolin 70/30 40 units QAM, 20 units QPM   Current orders for Inpatient glycemic control: NPH 6 units BID, Novolog 2 units TID, Novolog 0-6 units TID & HS  Inpatient Diabetes Program Recommendations:    Noted CBG 200 mg/dL this AM and patient missed NPH last night.   Spoke with patient using interpreter regarding outpatient diabetes management. Verified outpatient doses and doses prior to pregnancy. Reviewed patient's current A1c of 8.4% and prior to pregnancy A1C of >13%. Explained what a A1c is and what it measures. Also reviewed goal A1c with patient, importance of good glucose control @ home, and blood sugar goals. Reviewed patho of DM, role of pancreas, current dosages while inpatient, current cbg and missed nph dose, impact of hormonal fluctuations in pregnancy as it relates to insulin resistance, increased risk for hypoglycemia, vascular changes and commorbidities Patient has a meter and needed testing supplies. Reviewed current recommended frequency now that patient is delivered and discussed when to call MD.  Patient needs a PCP for follow up. Can afford [facility] insulin- Novolin 70/30 so would need to  convert to this at discharge. In preparation consider: Novolin 70/30 8 units BID. Reviewed discrepancies with patient on pre-pregnant doses compared to current inpatient doses. Patient admits to missing doses daily prior to pregnancy and is committed to taking better care of herself. TOC consult placed. Patient has no further questions at this time.   Thanks, Lujean Rave, MSN, RNC-OB Diabetes Coordinator [phone] (8a-5p)

## 2021-07-02 ENCOUNTER — Other Ambulatory Visit (HOSPITAL_COMMUNITY): Payer: Self-pay

## 2021-07-02 ENCOUNTER — Ambulatory Visit: Payer: Self-pay

## 2021-07-02 LAB — SURGICAL PATHOLOGY

## 2021-07-02 LAB — GLUCOSE, CAPILLARY: Glucose-Capillary: 143 mg/dL — ABNORMAL HIGH (ref 70–99)

## 2021-07-02 MED ORDER — NIFEDIPINE ER 60 MG PO TB24
60.0000 mg | ORAL_TABLET | Freq: Two times a day (BID) | ORAL | 0 refills | Status: DC
  Filled 2021-07-02: qty 60, 30d supply, fill #0

## 2021-07-02 MED ORDER — INSULIN PEN NEEDLE 32G X 4 MM MISC
2 refills | Status: DC
  Filled 2021-07-02: qty 100, 30d supply, fill #0

## 2021-07-02 MED ORDER — FERROUS SULFATE 325 (65 FE) MG PO TABS
325.0000 mg | ORAL_TABLET | Freq: Every day | ORAL | 3 refills | Status: DC
  Filled 2021-07-02: qty 30, 30d supply, fill #0

## 2021-07-02 MED ORDER — HUMULIN 70/30 KWIKPEN (70-30) 100 UNIT/ML ~~LOC~~ SUPN
8.0000 [IU] | PEN_INJECTOR | Freq: Two times a day (BID) | SUBCUTANEOUS | 11 refills | Status: DC
  Filled 2021-07-02: qty 12, 75d supply, fill #0

## 2021-07-02 MED ORDER — NIFEDIPINE ER OSMOTIC RELEASE 30 MG PO TB24: 30.0000 mg | ORAL_TABLET | Freq: Once | ORAL | Status: DC

## 2021-07-02 MED ORDER — IBUPROFEN 600 MG PO TABS
600.0000 mg | ORAL_TABLET | Freq: Four times a day (QID) | ORAL | 0 refills | Status: DC
  Filled 2021-07-02: qty 30, 8d supply, fill #0

## 2021-07-02 MED ORDER — NIFEDIPINE ER 30 MG PO TB24
30.0000 mg | ORAL_TABLET | Freq: Two times a day (BID) | ORAL | 2 refills | Status: DC
  Filled 2021-07-02: qty 60, 30d supply, fill #0

## 2021-07-02 NOTE — Lactation Note (Signed)
This note was copied from a baby's chart. Lactation Consultation Note Mother to d/c today. She continues to pump regularly and with increasing yield. FOB reports that he was unable to pick up [facility] pump yesterday at 11:00 as planned. He plans to pick up tomorrow. We reviewed importance of breast stimulation and I provided ed on manual pump. Parents are also aware of pump in infant's NICU room.   Patient Name: Kristin Clay [id number]'[id number] Date: 07/02/2021 Reason for consult: Follow-up assessment Age:36 hours  Maternal Data  Pumping frequency: q3 Pumped volume: 15 mL  Feeding Mother's Current Feeding Choice: Breast Milk and Formula  Interventions Interventions: Education  Discharge Discharge Education: Engorgement and breast care;Other (comment) (WIC pump) Pump: Manual WIC Program: Yes  Consult Status Consult Status: Follow-up Follow-up type: In-patient   [name], MA IBCLC 07/02/2021, 3:01 PM

## 2021-07-02 NOTE — Progress Notes (Addendum)
Post Partum Day 3 s/p FAVD/3c with mediolateral epis c/b CHTN with SI PreE with SF and T1DM  Subjective: Up ad lib, voiding, tolerating PO, and + flatus. She notes minimal lochia. Pain is well controlled. Arabic AMN interpreter used for encounter. Patient denies any headaches, visual symptoms, RUQ/epigastric pain or other concerning symptoms.   Objective: Blood pressure 137/84, pulse 87, temperature 98.5 F (36.9 C), temperature source Oral, resp. rate 18, height 5' (1.524 m), weight 49.7 kg, last menstrual period 10/22/2020, SpO2 99 %, unknown if currently breastfeeding.  Patient Vitals for the past 24 hrs:  BP Temp Temp src Pulse Resp SpO2  07/02/21 1103 137/84 98.5 F (36.9 C) Oral 87 18 99 %  07/02/21 0800 138/83 98.1 F (36.7 C) Oral 80 18 99 %  07/02/21 0623 138/85 98.1 F (36.7 C) Oral 91 17 99 %  07/01/21 2231 136/79 -- -- 87 -- --  07/01/21 1924 125/74 98.2 F (36.8 C) Oral 84 17 100 %  07/01/21 1527 (!) 142/82 98.2 F (36.8 C) Oral 85 18 100 %     Physical Exam:  General: alert, cooperative, appears stated age, and no distress Lochia: appropriate Uterine Fundus: firm DVT Evaluation: No evidence of DVT seen on physical exam. No cords or calf tenderness.  Recent Labs    07/01/21 0033  HGB 9.1*  HCT 28.5*   Assessment/Plan: CHTN with SI PreE with SF - Procardia at 60 mg BID asymptomatic  T1DM - NPH 6 BID, Novolog 2 with meals and SSI -To be discharged on Insulin 70/30 8 units bid as per DM coordinator, appreciate their input  Routine PP Care - Rh negative - Rhogam indicated and given - FAVD with 3c laceration for PFPT after recovered with 1 wk pelvic exam in the office. Continue daily miralax - Unsure PP birth control - Bottlefeeding - Baby stable in NICU as per patient  Discharge later today   LOS: 9 days   Jaynie Collins, MD

## 2021-07-02 NOTE — Care Management Note (Signed)
Case Management Note  Patient Details  Name: Kristin Clay MRN: [id number] Date of Birth: 11-21-1986  Subjective/Objective:                  She was admitted for cHTN with superimposed pre-eclampsia with severe features as well as type  1 diabetes and FGR  Action/Plan:  PCP appointment made.   Expected Discharge Date:  07/02/21                 In-House Referral:  Diabetes Coordinator  Discharge planning Services  CM Consult  Additional Comments: CM met with patient and husband in room.  Patient was in the restroom but reviewed address and demographics with FOB.  PCP appointment made per MD's request and placed in the epic in the follow up section for Sept. 1st at 9:40 am with Dr. Redmond Pulling at the [facility] ph# [phone] address: [address] # [place], [place] [address].  Dad confirmed he has a car and can take her to the appointment.  He works but does not go in till 2:30 pm each day.  Meds to be delivered to room by [facility] pharmacy.  Patient has active Medicaid.   Yong Channel, RN 07/02/2021, 1:40 PM

## 2021-07-04 ENCOUNTER — Inpatient Hospital Stay (HOSPITAL_COMMUNITY)
Admission: AD | Admit: 2021-07-04 | Discharge: 2021-07-06 | DRG: 637 | Disposition: A | Discharge status: Home / Self Care | Payer: 59 | Attending: Internal Medicine | Admitting: Internal Medicine

## 2021-07-04 ENCOUNTER — Inpatient Hospital Stay (HOSPITAL_COMMUNITY): Payer: 59

## 2021-07-04 DIAGNOSIS — E1010 Type 1 diabetes mellitus with ketoacidosis without coma: Principal | ICD-10-CM | POA: Diagnosis present

## 2021-07-04 DIAGNOSIS — R32 Unspecified urinary incontinence: Secondary | ICD-10-CM | POA: Diagnosis present

## 2021-07-04 DIAGNOSIS — O895 Other complications of spinal and epidural anesthesia during the puerperium: Secondary | ICD-10-CM

## 2021-07-04 DIAGNOSIS — S064X9A Epidural hemorrhage with loss of consciousness of unspecified duration, initial encounter: Secondary | ICD-10-CM | POA: Diagnosis not present

## 2021-07-04 DIAGNOSIS — Z8639 Personal history of other endocrine, nutritional and metabolic disease: Secondary | ICD-10-CM | POA: Diagnosis not present

## 2021-07-04 DIAGNOSIS — O9089 Other complications of the puerperium, not elsewhere classified: Secondary | ICD-10-CM | POA: Diagnosis not present

## 2021-07-04 DIAGNOSIS — S064XAA Epidural hemorrhage with loss of consciousness status unknown, initial encounter: Secondary | ICD-10-CM | POA: Diagnosis present

## 2021-07-04 DIAGNOSIS — M546 Pain in thoracic spine: Secondary | ICD-10-CM | POA: Diagnosis not present

## 2021-07-04 DIAGNOSIS — Z8249 Family history of ischemic heart disease and other diseases of the circulatory system: Secondary | ICD-10-CM

## 2021-07-04 DIAGNOSIS — Z79899 Other long term (current) drug therapy: Secondary | ICD-10-CM

## 2021-07-04 DIAGNOSIS — O119 Pre-existing hypertension with pre-eclampsia, unspecified trimester: Secondary | ICD-10-CM | POA: Diagnosis present

## 2021-07-04 DIAGNOSIS — O2403 Pre-existing diabetes mellitus, type 1, in the puerperium: Secondary | ICD-10-CM | POA: Diagnosis present

## 2021-07-04 DIAGNOSIS — Z91014 Allergy to mammalian meats: Secondary | ICD-10-CM

## 2021-07-04 DIAGNOSIS — Z794 Long term (current) use of insulin: Secondary | ICD-10-CM

## 2021-07-04 DIAGNOSIS — O1003 Pre-existing essential hypertension complicating the puerperium: Secondary | ICD-10-CM

## 2021-07-04 DIAGNOSIS — G9519 Other vascular myelopathies: Secondary | ICD-10-CM | POA: Diagnosis present

## 2021-07-04 DIAGNOSIS — M545 Low back pain, unspecified: Secondary | ICD-10-CM | POA: Diagnosis not present

## 2021-07-04 HISTORY — DX: Epidural hemorrhage with loss of consciousness status unknown, initial encounter: S06.4XAA

## 2021-07-04 HISTORY — DX: Epidural hemorrhage with loss of consciousness of unspecified duration, initial encounter: S06.4X9A

## 2021-07-04 LAB — CBC WITH DIFFERENTIAL/PLATELET
Abs Immature Granulocytes: 0.03 10*3/uL (ref 0.00–0.07)
Basophils Absolute: 0 10*3/uL (ref 0.0–0.1)
Basophils Relative: 1 %
Eosinophils Absolute: 0.1 10*3/uL (ref 0.0–0.5)
Eosinophils Relative: 3 %
HCT: 31.6 % — ABNORMAL LOW (ref 36.0–46.0)
Hemoglobin: 10.6 g/dL — ABNORMAL LOW (ref 12.0–15.0)
Immature Granulocytes: 1 %
Lymphocytes Relative: 28 %
Lymphs Abs: 1.4 10*3/uL (ref 0.7–4.0)
MCH: 29.5 pg (ref 26.0–34.0)
MCHC: 33.5 g/dL (ref 30.0–36.0)
MCV: 88 fL (ref 80.0–100.0)
Monocytes Absolute: 0.4 10*3/uL (ref 0.1–1.0)
Monocytes Relative: 8 %
Neutro Abs: 3.1 10*3/uL (ref 1.7–7.7)
Neutrophils Relative %: 59 %
Platelets: 328 10*3/uL (ref 150–400)
RBC: 3.59 MIL/uL — ABNORMAL LOW (ref 3.87–5.11)
RDW: 13.2 % (ref 11.5–15.5)
WBC: 5.1 10*3/uL (ref 4.0–10.5)
nRBC: 0 % (ref 0.0–0.2)

## 2021-07-04 LAB — COMPREHENSIVE METABOLIC PANEL
ALT: 13 U/L (ref 0–44)
AST: 11 U/L — ABNORMAL LOW (ref 15–41)
Albumin: 2.5 g/dL — ABNORMAL LOW (ref 3.5–5.0)
Alkaline Phosphatase: 87 U/L (ref 38–126)
Anion gap: 7 (ref 5–15)
BUN: 12 mg/dL (ref 6–20)
CO2: 22 mmol/L (ref 22–32)
Calcium: 8.5 mg/dL — ABNORMAL LOW (ref 8.9–10.3)
Chloride: 108 mmol/L (ref 98–111)
Creatinine, Ser: 0.38 mg/dL — ABNORMAL LOW (ref 0.44–1.00)
GFR, Estimated: >60 mL/min (ref >60)
Glucose, Bld: 105 mg/dL — ABNORMAL HIGH (ref 70–99)
Potassium: 3.7 mmol/L (ref 3.5–5.1)
Sodium: 137 mmol/L (ref 135–145)
Total Bilirubin: 0.8 mg/dL (ref 0.3–1.2)
Total Protein: 6.7 g/dL (ref 6.5–8.1)

## 2021-07-04 IMAGING — MR MR LUMBAR SPINE WO/W CM
4 of 7 series · 23 of 48 positions shown · IV contrast (gadavist)
Comparison: None available.

CLINICAL DATA: Initial evaluation for severe lower back pain status
post recent epidural injection.

EXAM:
MRI THORACIC AND LUMBAR SPINE WITHOUT AND WITH CONTRAST
TECHNIQUE: Multiplanar and multiecho pulse sequences of the thoracic and lumbar
spine were obtained without and with intravenous contrast.
CONTRAST:  5mL GADAVIST GADOBUTROL 1 MMOL/ML IV SOLN

[Series 1: T2 · sagittal · 4.0mm · 0.77mm/px · 4 of 13 slices shown (1 of 2)]
[im 1/13]
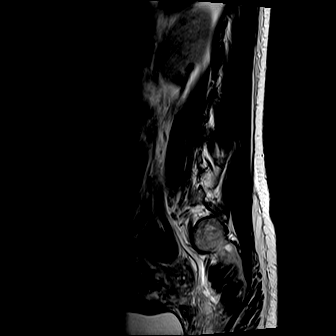
[im 5/13]
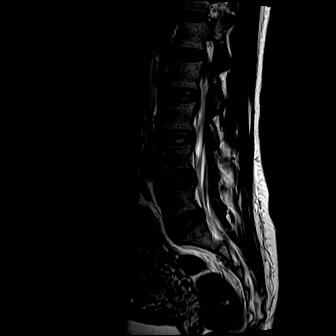
[im 9/13]
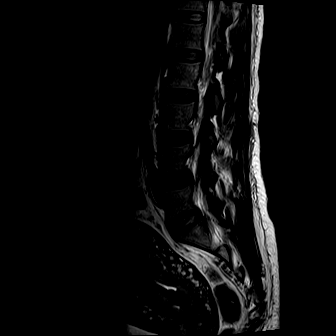
[im 13/13]
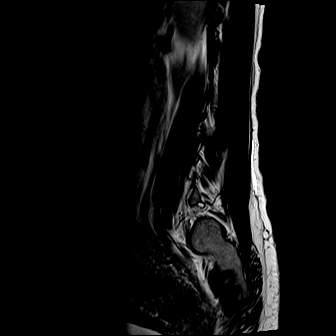

[Series 3: T1 · sagittal · 4.0mm · 0.88mm/px · 5 of 13 slices shown (1 of 2)]
[im 1/13]
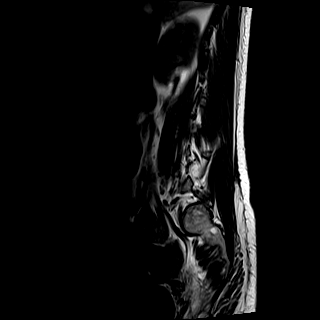
[im 4/13]
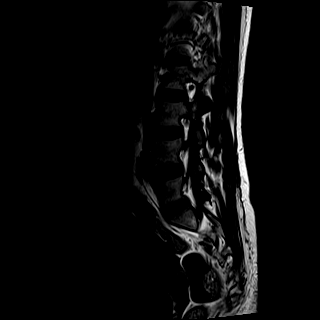
[im 7/13]
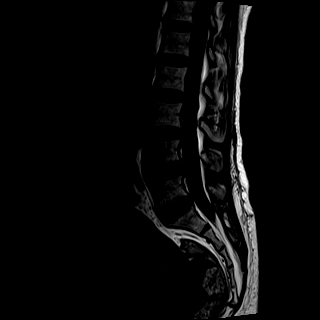
[im 10/13]
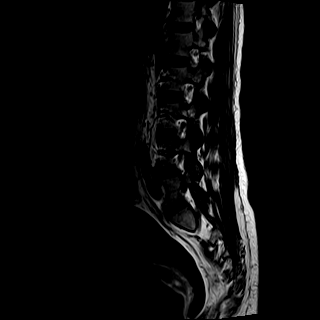
[im 13/13]
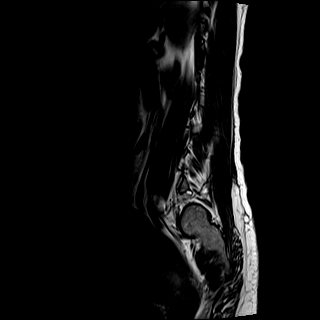

[Series 4: T2 · axial · 5.0mm · 0.57mm/px · z∈[-376,-171]mm · 8 of 26 slices shown (2 of 2)]
[im 1/26]
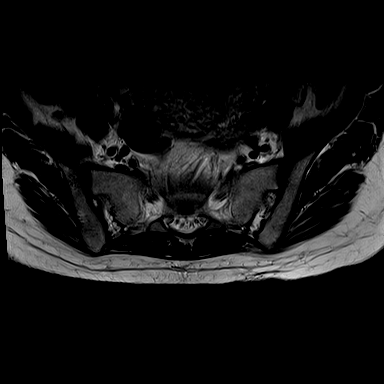
[im 3/26]
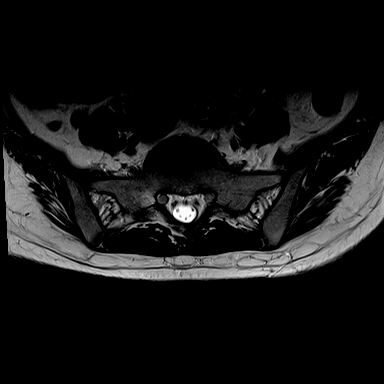
[im 9/26]
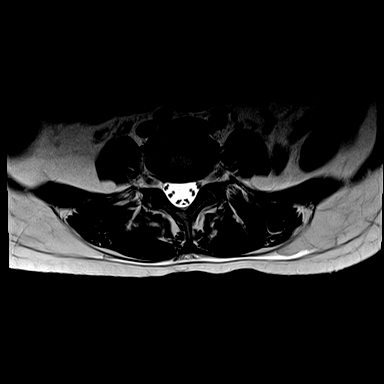
[im 12/26]
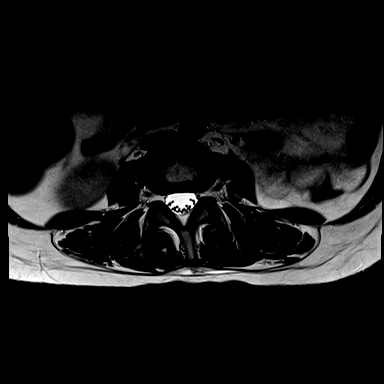
[im 14/26]
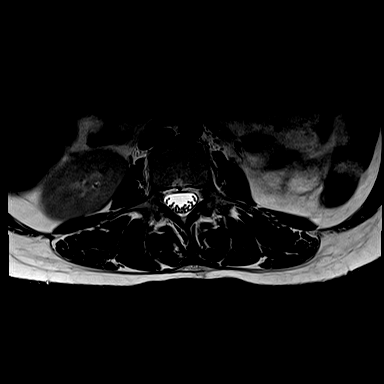
[im 17/26]
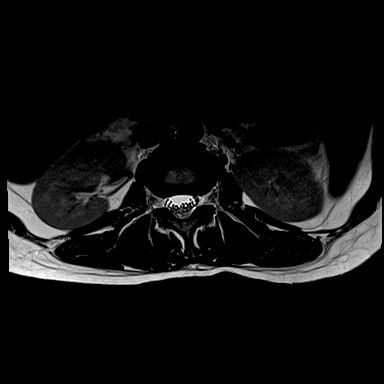
[im 23/26]
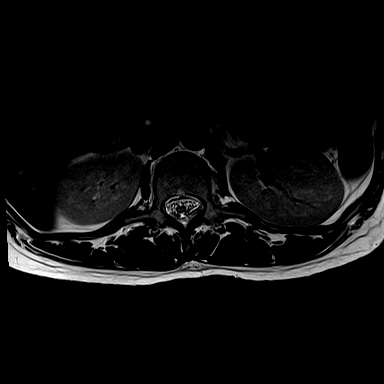
[im 26/26]
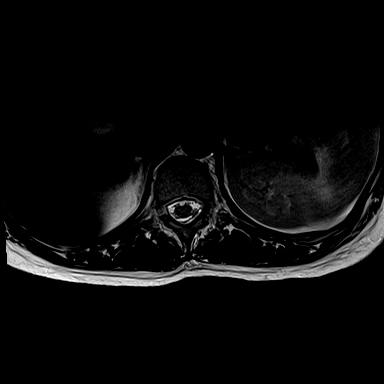

[Series 5: T1 · axial · 5.0mm · 0.34mm/px · z∈[-376,-194]mm · 6 of 26 slices shown (2 of 2)]
[im 1/26]
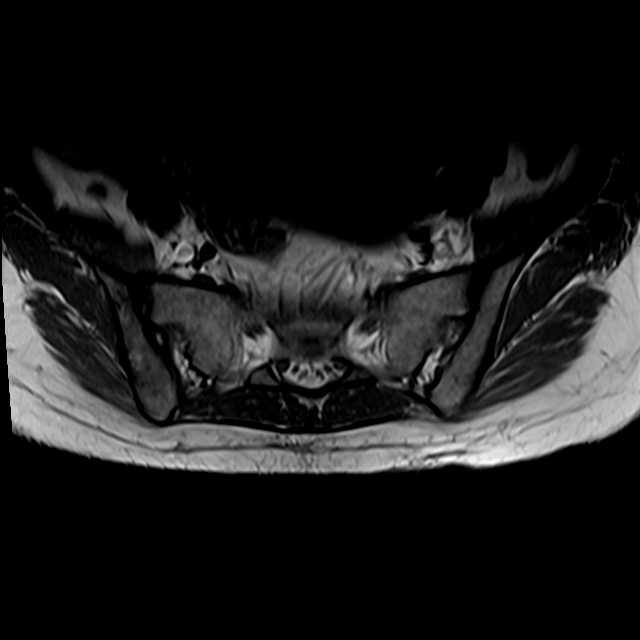
[im 3/26]
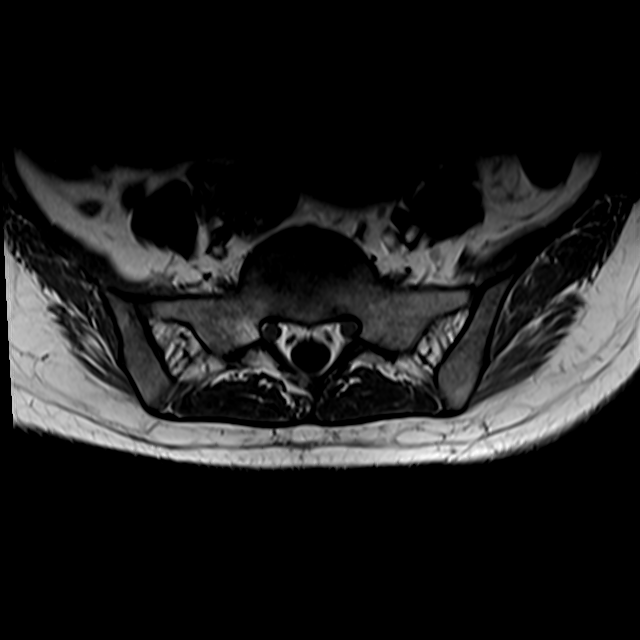
[im 9/26]
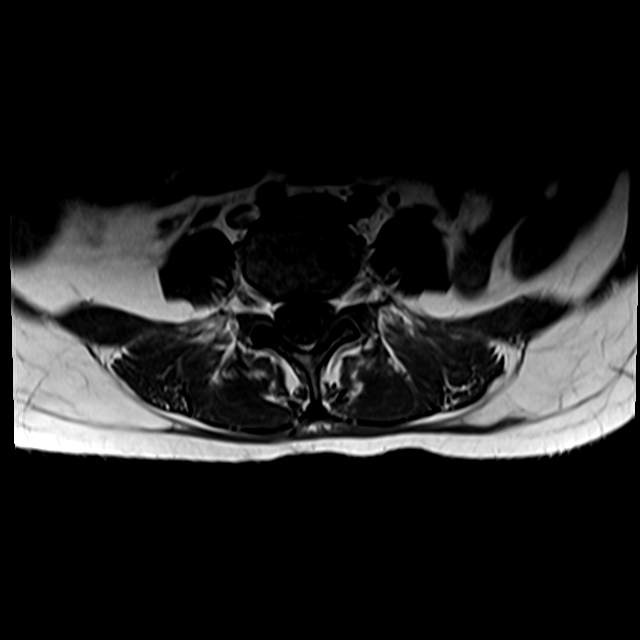
[im 12/26]
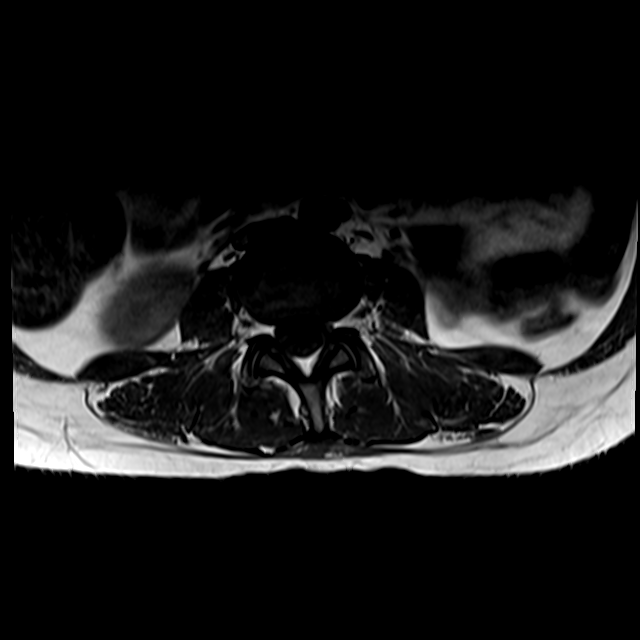
[im 14/26]
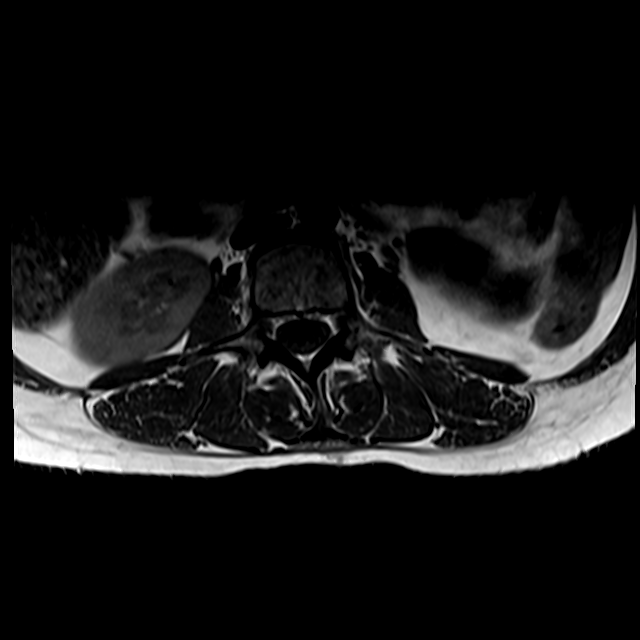
[im 23/26]
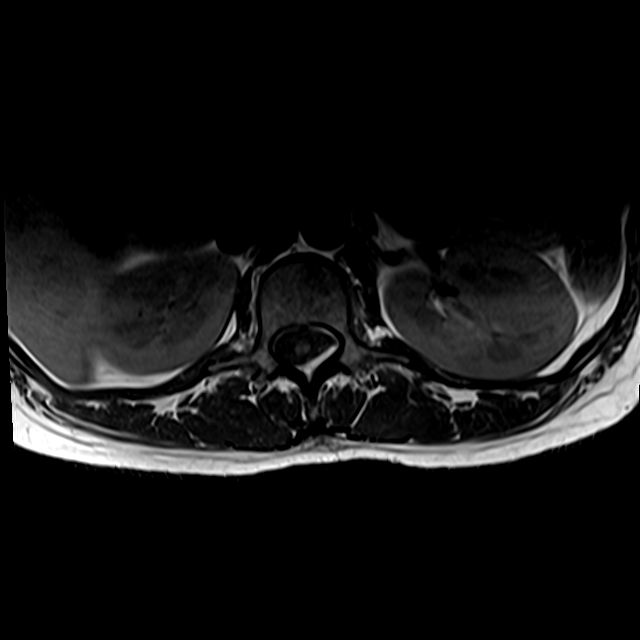

[23 of 48 positions shown; findings below may reference images not displayed]

FINDINGS: MRI THORACIC SPINE FINDINGS

Alignment: Physiologic with preservation of the normal thoracic
kyphosis. No listhesis.

Vertebrae: Vertebral body height maintained without acute or chronic
fracture. Bone marrow signal intensity normal. No discrete or
worrisome osseous lesions. No abnormal marrow edema or enhancement.
No evidence for osteomyelitis discitis or septic arthritis.

Cord: There is a small epidural collection involving the left dorsal
lateral aspect of the thecal sac, extending from the T11-12 through
L2-3 levels, likely reflecting a small epidural hematoma related to
recent spinal epidural injection (series 22, image 39). Collection
measures no more than 3 mm in AP diameter at its greatest dimension.
Secondary flattening of the left posterior thecal sac without
significant spinal stenosis or evidence for cord impingement.
Associated mild enhancement.

Otherwise, no other epidural collection seen elsewhere within the
thoracic spine. Cord itself is normal in caliber and appearance
without abnormal enhancement.

Paraspinal and other soft tissues: Unremarkable.

Disc levels:

No significant disc pathology seen within the thoracic spine. No
disc bulge or focal disc herniation. No other stenosis or
impingement.

MRI LUMBAR SPINE FINDINGS

Segmentation: Standard. Lowest well-formed disc space labeled the
L5-S1 level.

Alignment: Physiologic with preservation of the normal lumbar
lordosis. No listhesis.

Vertebrae: Vertebral body height maintained without acute or chronic
fracture. Bone marrow signal intensity within normal limits. No
discrete or worrisome osseous lesions. No evidence for osteomyelitis
discitis or septic arthritis.

Conus medullaris: Extends to the L1 level. Above described epidural
collection involving the left dorsal lateral epidural space extends
to the level of L2-3. Mild flattening and mass effect on the
adjacent thecal sac without significant spinal stenosis. This
measures no more than 3 mm in AP diameter at its greatest point in
the lumbar spine (series 5, image 7). Associated mild enhancement.

Otherwise, conus medullaris and nerve roots of the cauda equina are
normal in appearance.

Paraspinal and other soft tissues: Minimal stranding and enhancement
seen within the posterior paraspinous soft tissues near the level of
L3, likely related to recent epidural injection. No discrete soft
tissue collections. Paraspinous soft tissues otherwise unremarkable.

Disc levels:

No significant disc pathology seen within the lumbar spine. No disc
bulge or focal disc herniation. No significant stenosis or
impingement.
IMPRESSION: 1. Small epidural collection involving the left dorsolateral
epidural space extending from T11-12 through L2-3, likely reflecting
a small epidural hematoma related to recent spinal epidural
injection. Associated mild enhancement favored to be reactive,
although superimposed infection could be considered in the correct
clinical setting. No significant spinal stenosis or evidence for
cord impingement.
2. Otherwise normal MRI of the thoracic and lumbar spine.

## 2021-07-04 IMAGING — MR MR THORACIC SPINE WO/W CM
5 of 9 series · 21 of 48 positions shown · IV contrast (gadavist)
Comparison: None available.

CLINICAL DATA: Initial evaluation for severe lower back pain status
post recent epidural injection.

EXAM:
MRI THORACIC AND LUMBAR SPINE WITHOUT AND WITH CONTRAST
TECHNIQUE: Multiplanar and multiecho pulse sequences of the thoracic and lumbar
spine were obtained without and with intravenous contrast.
CONTRAST:  5mL GADAVIST GADOBUTROL 1 MMOL/ML IV SOLN

[Series 18: T1 · sagittal · 3.3mm · 0.62mm/px · 1 of 9 slices shown (1 of 3)]
[im 1/9]
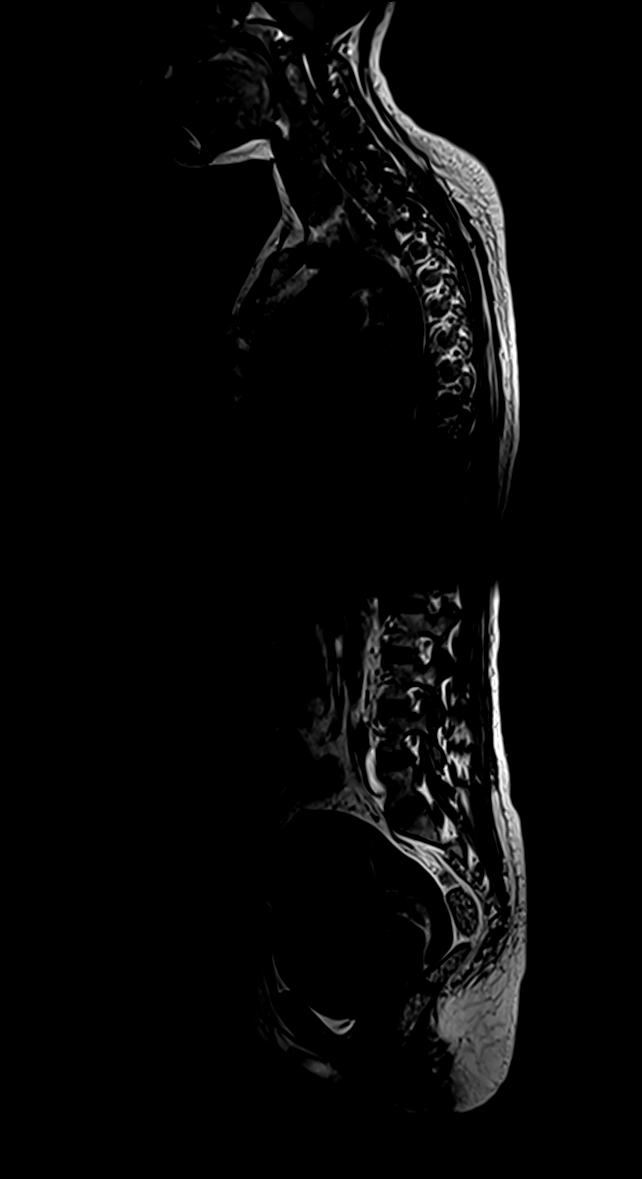

[Series 19: T2 · sagittal · 3.0mm · 0.78mm/px · 3 of 17 slices shown (1 of 2)]
[im 1/17]
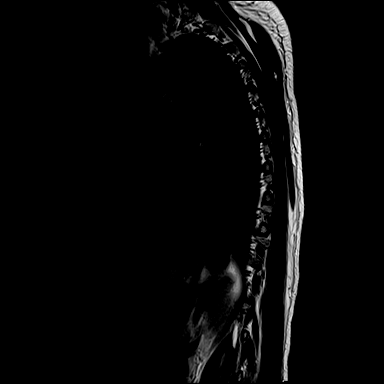
[im 9/17]
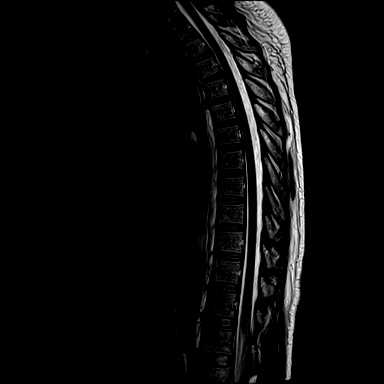
[im 17/17]
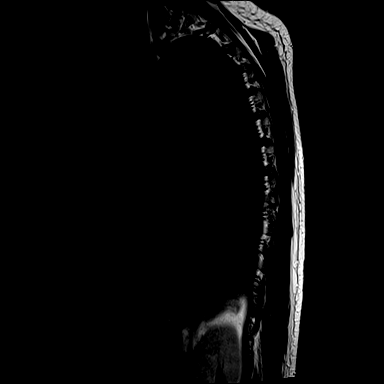

[Series 20: T1 · sagittal · 3.0mm · 0.78mm/px · 4 of 17 slices shown (2 of 3)]
[im 1/17]
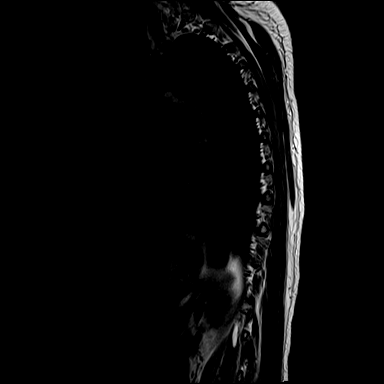
[im 6/17]
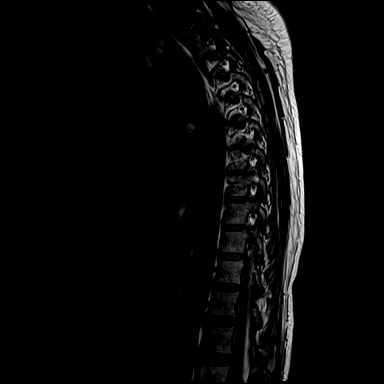
[im 11/17]
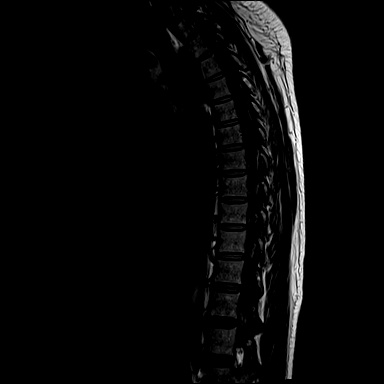
[im 17/17]
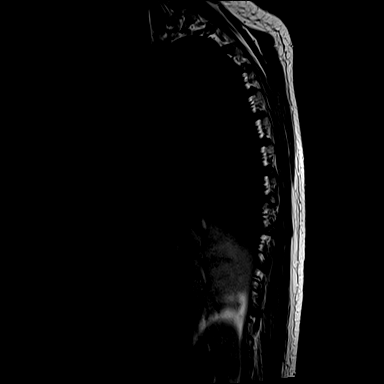

[Series 22: T2 · axial · 5.0mm · 0.59mm/px · z∈[-179,+6]mm · 8 of 39 slices shown (2 of 2)]
[im 1/39]
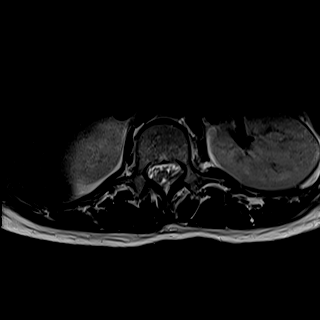
[im 6/39]
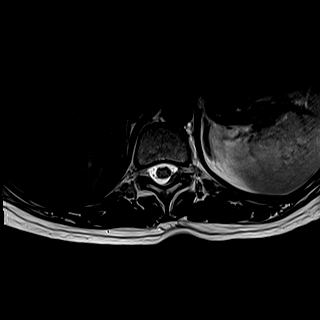
[im 11/39]
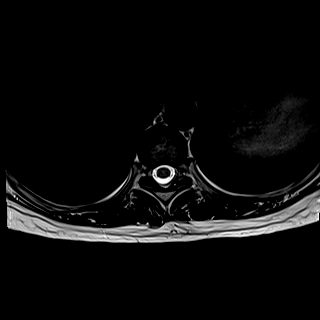
[im 17/39]
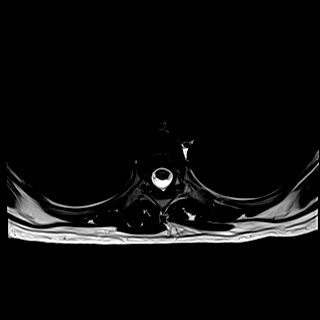
[im 22/39]
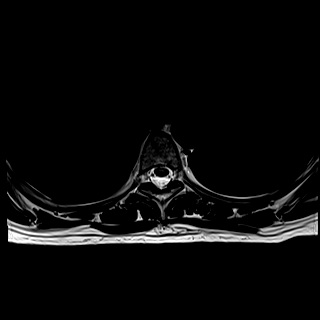
[im 28/39]
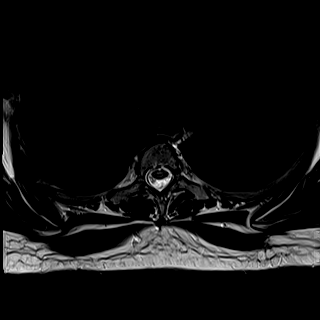
[im 33/39]
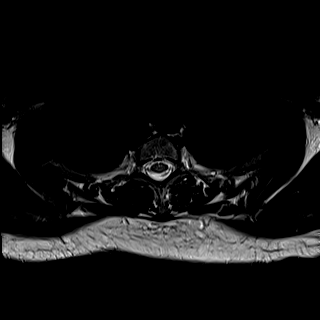
[im 39/39]
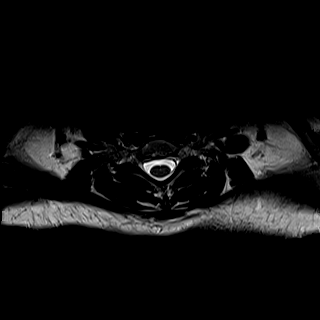

[Series 24: T1 · axial · non-contrast · 5.0mm · 0.31mm/px · z∈[-179,-45]mm · 5 of 39 slices shown (3 of 3)]
[im 1/39]
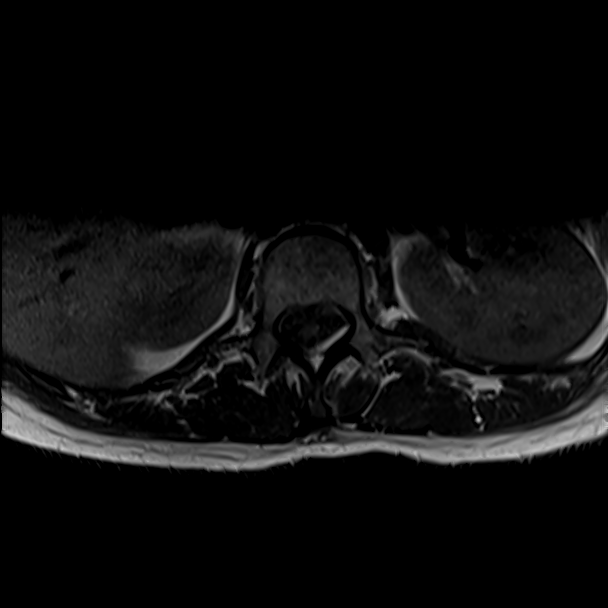
[im 6/39]
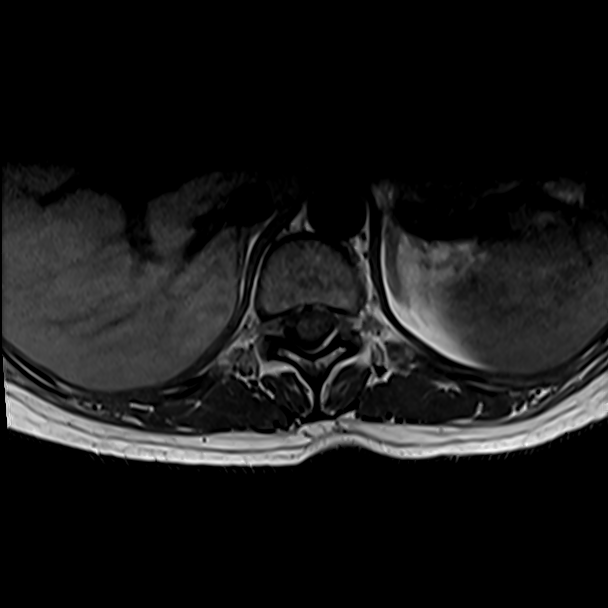
[im 11/39]
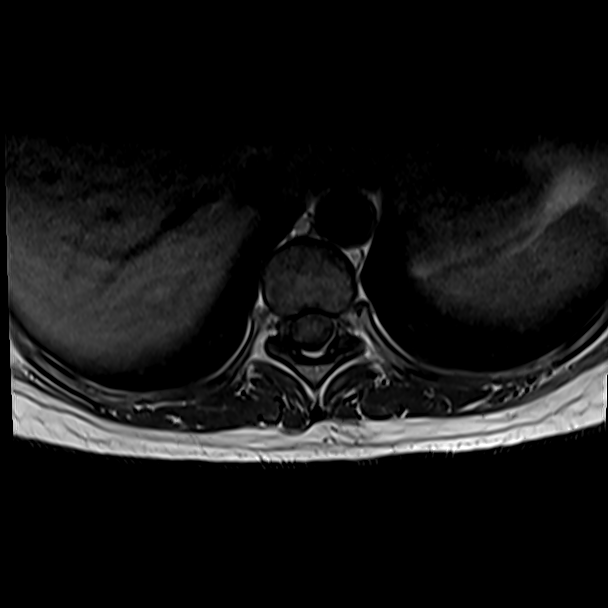
[im 17/39]
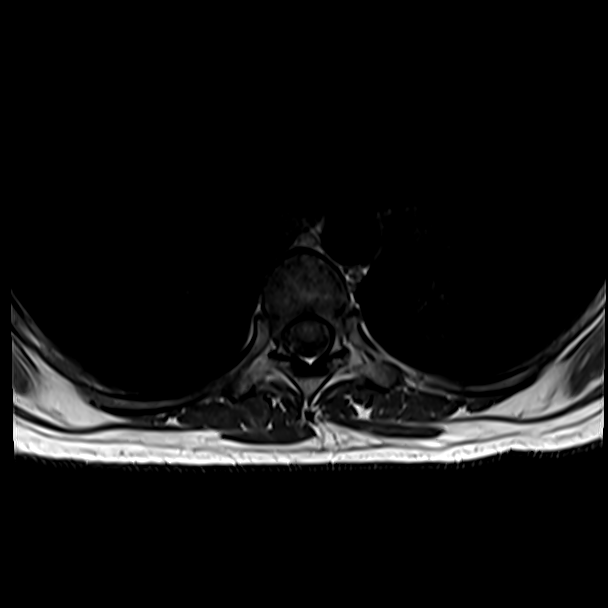
[im 22/39]
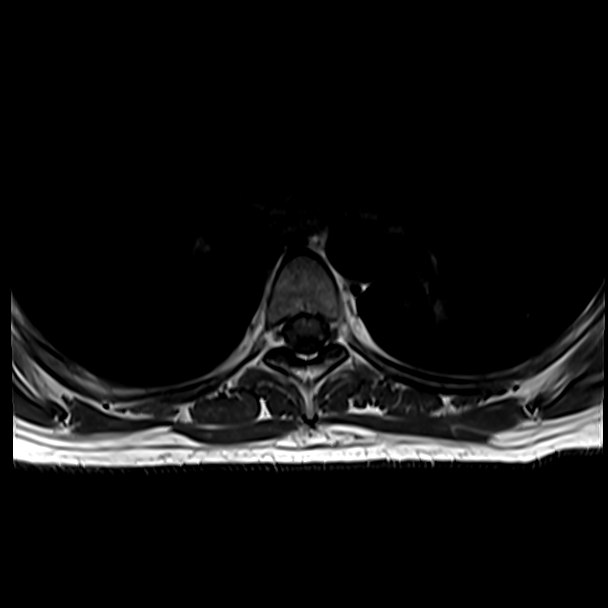

[21 of 48 positions shown; findings below may reference images not displayed]

FINDINGS: MRI THORACIC SPINE FINDINGS

Alignment: Physiologic with preservation of the normal thoracic
kyphosis. No listhesis.

Vertebrae: Vertebral body height maintained without acute or chronic
fracture. Bone marrow signal intensity normal. No discrete or
worrisome osseous lesions. No abnormal marrow edema or enhancement.
No evidence for osteomyelitis discitis or septic arthritis.

Cord: There is a small epidural collection involving the left dorsal
lateral aspect of the thecal sac, extending from the T11-12 through
L2-3 levels, likely reflecting a small epidural hematoma related to
recent spinal epidural injection (series 22, image 39). Collection
measures no more than 3 mm in AP diameter at its greatest dimension.
Secondary flattening of the left posterior thecal sac without
significant spinal stenosis or evidence for cord impingement.
Associated mild enhancement.

Otherwise, no other epidural collection seen elsewhere within the
thoracic spine. Cord itself is normal in caliber and appearance
without abnormal enhancement.

Paraspinal and other soft tissues: Unremarkable.

Disc levels:

No significant disc pathology seen within the thoracic spine. No
disc bulge or focal disc herniation. No other stenosis or
impingement.

MRI LUMBAR SPINE FINDINGS

Segmentation: Standard. Lowest well-formed disc space labeled the
L5-S1 level.

Alignment: Physiologic with preservation of the normal lumbar
lordosis. No listhesis.

Vertebrae: Vertebral body height maintained without acute or chronic
fracture. Bone marrow signal intensity within normal limits. No
discrete or worrisome osseous lesions. No evidence for osteomyelitis
discitis or septic arthritis.

Conus medullaris: Extends to the L1 level. Above described epidural
collection involving the left dorsal lateral epidural space extends
to the level of L2-3. Mild flattening and mass effect on the
adjacent thecal sac without significant spinal stenosis. This
measures no more than 3 mm in AP diameter at its greatest point in
the lumbar spine (series 5, image 7). Associated mild enhancement.

Otherwise, conus medullaris and nerve roots of the cauda equina are
normal in appearance.

Paraspinal and other soft tissues: Minimal stranding and enhancement
seen within the posterior paraspinous soft tissues near the level of
L3, likely related to recent epidural injection. No discrete soft
tissue collections. Paraspinous soft tissues otherwise unremarkable.

Disc levels:

No significant disc pathology seen within the lumbar spine. No disc
bulge or focal disc herniation. No significant stenosis or
impingement.
IMPRESSION: 1. Small epidural collection involving the left dorsolateral
epidural space extending from T11-12 through L2-3, likely reflecting
a small epidural hematoma related to recent spinal epidural
injection. Associated mild enhancement favored to be reactive,
although superimposed infection could be considered in the correct
clinical setting. No significant spinal stenosis or evidence for
cord impingement.
2. Otherwise normal MRI of the thoracic and lumbar spine.

## 2021-07-04 MED ORDER — CYCLOBENZAPRINE HCL 5 MG PO TABS
10.0000 mg | ORAL_TABLET | Freq: Once | ORAL | Status: AC
  Administered 2021-07-04: 10 mg via ORAL
  Filled 2021-07-04: qty 2

## 2021-07-04 MED ORDER — GADOBUTROL 1 MMOL/ML IV SOLN
5.0000 mL | Freq: Once | INTRAVENOUS | Status: AC | PRN
  Administered 2021-07-04: 5 mL via INTRAVENOUS

## 2021-07-04 MED ORDER — INSULIN ASPART 100 UNIT/ML IJ SOLN
0.0000 [IU] | Freq: Every day | INTRAMUSCULAR | Status: DC
  Administered 2021-07-05: 4 [IU] via SUBCUTANEOUS

## 2021-07-04 MED ORDER — FERROUS SULFATE 325 (65 FE) MG PO TABS
325.0000 mg | ORAL_TABLET | Freq: Every day | ORAL | Status: DC
  Administered 2021-07-05 – 2021-07-06 (×2): 325 mg via ORAL
  Filled 2021-07-04 (×2): qty 1

## 2021-07-04 MED ORDER — INSULIN ASPART 100 UNIT/ML IJ SOLN
0.0000 [IU] | Freq: Three times a day (TID) | INTRAMUSCULAR | Status: DC
Start: 2021-07-05 — End: 2021-07-06
  Administered 2021-07-05: 2 [IU] via SUBCUTANEOUS
  Administered 2021-07-05 (×2): 3 [IU] via SUBCUTANEOUS
  Administered 2021-07-06: 2 [IU] via SUBCUTANEOUS
  Administered 2021-07-06: 15 [IU] via SUBCUTANEOUS

## 2021-07-04 MED ORDER — NIFEDIPINE ER OSMOTIC RELEASE 30 MG PO TB24
60.0000 mg | ORAL_TABLET | Freq: Once | ORAL | Status: AC
  Administered 2021-07-04: 60 mg via ORAL
  Filled 2021-07-04: qty 2

## 2021-07-04 MED ORDER — ACETAMINOPHEN 500 MG PO TABS
1000.0000 mg | ORAL_TABLET | Freq: Once | ORAL | Status: AC
  Administered 2021-07-04: 1000 mg via ORAL
  Filled 2021-07-04: qty 2

## 2021-07-04 MED ORDER — DEXAMETHASONE SODIUM PHOSPHATE 10 MG/ML IJ SOLN
4.0000 mg | Freq: Four times a day (QID) | INTRAMUSCULAR | Status: DC
  Administered 2021-07-05 – 2021-07-06 (×7): 4 mg via INTRAVENOUS
  Filled 2021-07-04 (×2): qty 0.4
  Filled 2021-07-04: qty 1
  Filled 2021-07-04 (×6): qty 0.4

## 2021-07-04 MED ORDER — ACETAMINOPHEN 650 MG RE SUPP: 650.0000 mg | Freq: Four times a day (QID) | RECTAL | Status: DC | PRN

## 2021-07-04 MED ORDER — SODIUM CHLORIDE 0.9% FLUSH
3.0000 mL | Freq: Two times a day (BID) | INTRAVENOUS | Status: DC
  Administered 2021-07-05 – 2021-07-06 (×4): 3 mL via INTRAVENOUS

## 2021-07-04 MED ORDER — PRENATAL MULTIVITAMIN CH
1.0000 | ORAL_TABLET | Freq: Every day | ORAL | Status: DC
  Administered 2021-07-05 – 2021-07-06 (×2): 1 via ORAL
  Filled 2021-07-04 (×2): qty 1

## 2021-07-04 MED ORDER — ACETAMINOPHEN 325 MG PO TABS
650.0000 mg | ORAL_TABLET | Freq: Four times a day (QID) | ORAL | Status: DC | PRN
  Administered 2021-07-05 – 2021-07-06 (×3): 650 mg via ORAL
  Filled 2021-07-04 (×3): qty 2

## 2021-07-04 MED ORDER — INSULIN ASPART PROT & ASPART (70-30 MIX) 100 UNIT/ML ~~LOC~~ SUSP
6.0000 [IU] | Freq: Two times a day (BID) | SUBCUTANEOUS | Status: DC
  Administered 2021-07-05 – 2021-07-06 (×3): 6 [IU] via SUBCUTANEOUS
  Filled 2021-07-04: qty 10

## 2021-07-04 MED ORDER — NIFEDIPINE ER OSMOTIC RELEASE 60 MG PO TB24
60.0000 mg | ORAL_TABLET | Freq: Two times a day (BID) | ORAL | Status: DC
  Administered 2021-07-05 – 2021-07-06 (×3): 60 mg via ORAL
  Filled 2021-07-04 (×4): qty 1

## 2021-07-04 MED ORDER — KETOROLAC TROMETHAMINE 30 MG/ML IJ SOLN
30.0000 mg | Freq: Once | INTRAMUSCULAR | Status: AC
  Administered 2021-07-04: 30 mg via INTRAMUSCULAR
  Filled 2021-07-04: qty 1

## 2021-07-04 MED ORDER — CYCLOBENZAPRINE HCL 5 MG PO TABS
7.5000 mg | ORAL_TABLET | Freq: Once | ORAL | Status: DC | PRN
  Filled 2021-07-04: qty 2

## 2021-07-04 MED ORDER — KETOROLAC TROMETHAMINE 30 MG/ML IJ SOLN: 30.0000 mg | Freq: Three times a day (TID) | INTRAMUSCULAR | Status: DC | PRN

## 2021-07-04 NOTE — Progress Notes (Signed)
   Providing Compassionate, Quality Care - Together   BP 139/84   Pulse 73   Temp 98.2 F (36.8 C) (Oral)   Resp 20   SpO2 100%   Breastfeeding Yes Comment: milk is now coming in, has not been able to pump since went home  VS, labs, and imaging reviewed  Patient with increasing back pain since she delivered five days ago. Per report, her strength and sensation are intact. She is not having bowel or bladder dysfunction aside from some stress incontinence secondary to vaginal delivery. She received an epidural prior to her delivery on 06/29/2021. Thoracic/lumbar MRI showed a small epidural collection involving the left dorsal lateral aspect of the thecal sac, extending from the T11-12 through L2-3 levels, likely reflecting a small epidural hematoma related to recent spinal epidural injection. Recommend IV decadron for pain control with SSI due to Type I diabetes. Ensure patient is not coagulopathic. No pharmacologic DVT prophylaxis should be ordered. There is no need for surgery as this should clear on its own. Formal consult to follow in the morning.    Val Eagle, DNP, AGNP-C Nurse Practitioner 07/04/2021 10:22 PM    [facility] [address], [address], [place], [place] [address] P: [phone]    F: [phone]

## 2021-07-04 NOTE — MAU Note (Addendum)
Vag del 8/7, dc'd from hospital 2 days ago. Here today because of back pain, pointing mid to lower spine. States pain started prior to d/c, and has gotten worse.  Has taken pain medication- no relief. No pain anywhere else. Denies fever.  Wearing diapers as she is not able to walk to bathroom.  Is able to urinate and has had a bowel movement. "Feels like it is separated and needs to be reassembled". Had an epidural for labor. Neg CVA tenderness.

## 2021-07-04 NOTE — Progress Notes (Signed)
Called to evaluate patient due to back pain. Per patient she delivered 5 days ago. Her pain began prior to discharge and was at first manageable but has become debilitating over the last two days. She describes it as severe, located in the lower spine at the site of her epidural, is constant, it does not radiate into the surrounding muscles or down her legs. She states that she feels her strength is fine but is limited in ambulation by pain. Denies bowel dysfunction although states she has had trouble making it all the way to the bathroom due to pain and difficulty with ambulation 2/2 pain. She does state that she has had some urinary incontinence but also vaginally delivered 5 days ago. On exam she is afebrile. There is a very small area surrounding the previous epidural insertion site that is very TTP. No appreciable erythema, warmth or drainage. Surrounding muscles are not TTP and no neck tenderness. Strength in BLE are 5/5 on straight leg raise, hamstring curl, dorsiflexion and plantar flexion of the ankle. Sensation to light touch intact. Discussed with Donia Ast, NP for OBGYN and discussed that MRI is warranted to further characterize back pain. Pending imaging Neurosurgery consult may be warranted. Anesthesiology will plan to follow.  Glade Stanford, MD

## 2021-07-04 NOTE — H&P (Signed)
History and Physical   sayaka hoeppner PPJ:[id number] DOB: August 23, 1986 DOA: 07/04/2021  PCP: Pcp, No   Patient coming from: Home  Chief Complaint: Back pain  HPI: Kristin Clay is a 35 y.o. female with medical history significant of type 1 diabetes, hypertension, preeclampsia, vaginal delivery, female circumcision is presenting with worsening back pain since delivery 5 days ago.  History obtained with assistance of Fish farm manager. Patient had a vaginal delivery 5 days ago.  Child was born preterm and is currently in the NICU at [facility].  Her back pain has been persistent since that time and worsening.  She was able to tolerate until 2 days ago when it began to become debilitating.  Pain is located at the site of the epidural and is a constant pain with no radiation.  No numbness or weakness in her legs no saddle anesthesia.  Has had some urinary incontinence but she is just 5 days out from her vaginal delivery. No over-the-counter medicines helping with pain. Toradol received in the ED did help with the pain. Denies fevers, chills, chest pain, shortness of breath, abdominal pain, constipation, diarrhea, nausea, vomiting.   MAU course: Vital signs in the ED MAU significant for blood pressure in the 580D 983 systolic most recently in the 130s.  Lab work-up showed CMP with glucose 105, calcium 8.5, albumin 2.5.  CBC with hemoglobin stable at 10.6.  MRI showed a small epidural collection involving the dorsal lateral epidural space from around T11-L3.  Likely hematoma.  There is some associated mild enhancement which is favored to be reactive.  Anesthesia consulted initially in the MAU who recommended the imaging and neurosurgery consult.  Neurosurgery reviewed imaging and recommend Decadron and monitoring and they will see the patient in the morning.  Patient received dose of Flexeril, Toradol, nifedipine in ED MAU.  Decadron has been ordered.  Review of Systems: As per HPI otherwise  all other systems reviewed and are negative.  Past Medical History:  Diagnosis Date   Chronic hypertension    Complication of anesthesia    with hand surgery, local anes did not work, tried twice- had to put her to sleep   Diabetes mellitus type 1 ([place])    DKA (diabetic ketoacidosis) ([place]) 10/2020   Infection    UTI   Pyelonephritis 10/2020   Type I diabetes mellitus ([place])    dx 13yr ago    Past Surgical History:  Procedure Laterality Date   female circumcision Bilateral    clitorectomy as well   FOOT SURGERY  2018   HAND SURGERY  2019    Social History  reports that she has never smoked. She has never used smokeless tobacco. She reports that she does not drink alcohol and does not use drugs.  Allergies  Allergen Reactions   Pork-Derived Products     Pt does not want any pork products    Family History  Problem Relation Age of Onset   Hyperlipidemia Mother    Hypertension Mother    Kidney disease Father    Alzheimer's disease Father   Reviewed on admission  Prior to Admission medications   Medication Sig Start Date End Date Taking? Authorizing Provider  cyclobenzaprine (FLEXERIL) 5 MG tablet Take 1 tablet (5 mg total) by mouth at bedtime as needed for muscle spasms. 06/05/21  Yes GRanda Ngo MD  ferrous sulfate 325 (65 FE) MG tablet Take 1 tablet (325 mg total) by mouth daily with breakfast. 07/03/21  Yes Anyanwu,  Sallyanne Havers, MD  ibuprofen (ADVIL) 600 MG tablet Take 1 tablet (600 mg total) by mouth every 6 (six) hours. Patient taking differently: Take 600 mg by mouth every 6 (six) hours as needed for fever, headache or mild pain. 07/02/21  Yes Anyanwu, Sallyanne Havers, MD  insulin isophane & regular human KwikPen (HUMULIN 70/30 KWIKPEN) (70-30) 100 UNIT/ML KwikPen Inject 8 Units into the skin 2 (two) times daily. 07/02/21  Yes Anyanwu, Sallyanne Havers, MD  NIFEdipine (ADALAT CC) 60 MG 24 hr tablet Take 1 tablet (60 mg total) by mouth 2 (two) times daily. 07/02/21  Yes Anyanwu, Sallyanne Havers, MD  Prenatal Vit-Fe Fumarate-FA (PRENATAL VITAMIN) 27-0.8 MG TABS Take 1 tablet by mouth daily. 06/05/21  Yes Randa Ngo, MD  blood glucose meter kit and supplies Dispense based on patient and insurance preference. Use up to four times daily as directed. (FOR ICD-10 E10.9, E11.9). 02/22/20   Wendall Mola, NP  glucose blood test strip Use as instructed QID 01/30/20   Anyanwu, Sallyanne Havers, MD  Insulin Pen Needle 32G X 4 MM MISC Use as directed 07/02/21   Anyanwu, Sallyanne Havers, MD  OneTouch Delica Lancets 30S MISC 1 Device by Does not apply route in the morning, at noon, in the evening, and at bedtime. 02/01/20   Osborne Oman, MD    Physical Exam: Vitals:   07/04/21 1643 07/04/21 1703 07/04/21 1801 07/04/21 2148  BP: (!) 172/99 (!) 148/82 140/84 139/84  Pulse: 88 87 94 73  Resp:      Temp:      TempSrc:      SpO2:       Physical Exam Constitutional:      General: She is not in acute distress.    Appearance: Normal appearance.  HENT:     Head: Normocephalic and atraumatic.     Mouth/Throat:     Mouth: Mucous membranes are moist.     Pharynx: Oropharynx is clear.  Eyes:     Extraocular Movements: Extraocular movements intact.     Pupils: Pupils are equal, round, and reactive to light.  Cardiovascular:     Rate and Rhythm: Normal rate and regular rhythm.     Pulses: Normal pulses.     Heart sounds: Normal heart sounds.  Pulmonary:     Effort: Pulmonary effort is normal. No respiratory distress.     Breath sounds: Normal breath sounds.  Abdominal:     General: Bowel sounds are normal. There is no distension.     Palpations: Abdomen is soft.     Tenderness: There is no abdominal tenderness.  Musculoskeletal:        General: No swelling or deformity.     Comments: Back pain at epidural site  Skin:    General: Skin is warm and dry.  Neurological:     General: No focal deficit present.     Mental Status: Mental status is at baseline.     Sensory: No sensory deficit.      Motor: No weakness.   Labs on Admission: I have personally reviewed following labs and imaging studies  CBC: Recent Labs  Lab 06/28/21 0927 06/29/21 0813 06/29/21 1457 07/01/21 0033 07/04/21 1704  WBC 5.5 4.7 5.6 5.6 5.1  NEUTROABS  --   --   --   --  3.1  HGB 13.2 12.6 11.3* 9.1* 10.6*  HCT 39.7 38.1 35.4* 28.5* 31.6*  MCV 86.9 89.0 89.8 89.9 88.0  PLT 246 211 211 198  924    Basic Metabolic Panel: Recent Labs  Lab 06/28/21 0019 06/29/21 0813 07/01/21 0033 07/04/21 1704  NA 134* 131* 135 137  K 3.9 4.4 4.1 3.7  CL 107 103 105 108  CO2 20* 19* 23 22  GLUCOSE 83 93 141* 105*  BUN _0 CREATININE 0.34* 0.46 0.52 0.38*  CALCIUM 7.6* 6.4* 7.6* 8.5*    GFR: Estimated Creatinine Clearance: 69.8 mL/min (A) (by C-G formula based on SCr of 0.38 mg/dL (L)).  Liver Function Tests: Recent Labs  Lab 06/28/21 0019 06/29/21 0813 07/01/21 0033 07/04/21 1704  AST _1 11*  ALT _2 ALKPHOS 91 101 80 87  BILITOT 0.6 1.2 0.4 0.8  PROT 6.1* 6.1* 5.3* 6.7  ALBUMIN 2.5* 2.4* 2.0* 2.5*    Urine analysis:    Component Value Date/Time   COLORURINE AMBER (A) 06/23/2021 1023   APPEARANCEUR CLOUDY (A) 06/23/2021 1023   LABSPEC 1.019 06/23/2021 1023   PHURINE 6.0 06/23/2021 1023   GLUCOSEU 150 (A) 06/23/2021 1023   HGBUR NEGATIVE 06/23/2021 1023   [place] 06/23/2021 1023   BILIRUBINUR negative 02/22/2020 1529   KETONESUR 5 (A) 06/23/2021 1023   PROTEINUR 100 (A) 06/23/2021 1023   UROBILINOGEN 1.0 05/06/2021 1039   NITRITE NEGATIVE 06/23/2021 1023   LEUKOCYTESUR NEGATIVE 06/23/2021 1023    Radiological Exams on Admission: MR THORACIC SPINE W WO CONTRAST  Result Date: 07/04/2021 CLINICAL DATA:  Initial evaluation for severe lower back pain status post recent epidural injection. EXAM: MRI THORACIC AND LUMBAR SPINE WITHOUT AND WITH CONTRAST TECHNIQUE: Multiplanar and multiecho pulse sequences of the thoracic and lumbar spine were obtained  without and with intravenous contrast. CONTRAST:  54m GADAVIST GADOBUTROL 1 MMOL/ML IV SOLN COMPARISON:  None available. FINDINGS: MRI THORACIC SPINE FINDINGS Alignment: Physiologic with preservation of the normal thoracic kyphosis. No listhesis. Vertebrae: Vertebral body height maintained without acute or chronic fracture. Bone marrow signal intensity normal. No discrete or worrisome osseous lesions. No abnormal marrow edema or enhancement. No evidence for osteomyelitis discitis or septic arthritis. Cord: There is a small epidural collection involving the left dorsal lateral aspect of the thecal sac, extending from the T11-12 through L2-3 levels, likely reflecting a small epidural hematoma related to recent spinal epidural injection (series 22, image 39). Collection measures no more than 3 mm in AP diameter at its greatest dimension. Secondary flattening of the left posterior thecal sac without significant spinal stenosis or evidence for cord impingement. Associated mild enhancement. Otherwise, no other epidural collection seen elsewhere within the thoracic spine. Cord itself is normal in caliber and appearance without abnormal enhancement. Paraspinal and other soft tissues: Unremarkable. Disc levels: No significant disc pathology seen within the thoracic spine. No disc bulge or focal disc herniation. No other stenosis or impingement. MRI LUMBAR SPINE FINDINGS Segmentation: Standard. Lowest well-formed disc space labeled the L5-S1 level. Alignment: Physiologic with preservation of the normal lumbar lordosis. No listhesis. Vertebrae: Vertebral body height maintained without acute or chronic fracture. Bone marrow signal intensity within normal limits. No discrete or worrisome osseous lesions. No evidence for osteomyelitis discitis or septic arthritis. Conus medullaris: Extends to the L1 level. Above described epidural collection involving the left dorsal lateral epidural space extends to the level of L2-3. Mild  flattening and mass effect on the adjacent thecal sac without significant spinal stenosis. This measures no more than 3 mm in AP diameter at its greatest point in the lumbar spine (series 5, image  7). Associated mild enhancement. Otherwise, conus medullaris and nerve roots of the cauda equina are normal in appearance. Paraspinal and other soft tissues: Minimal stranding and enhancement seen within the posterior paraspinous soft tissues near the level of L3, likely related to recent epidural injection. No discrete soft tissue collections. Paraspinous soft tissues otherwise unremarkable. Disc levels: No significant disc pathology seen within the lumbar spine. No disc bulge or focal disc herniation. No significant stenosis or impingement. IMPRESSION: 1. Small epidural collection involving the left dorsolateral epidural space extending from T11-12 through L2-3, likely reflecting a small epidural hematoma related to recent spinal epidural injection. Associated mild enhancement favored to be reactive, although superimposed infection could be considered in the correct clinical setting. No significant spinal stenosis or evidence for cord impingement. 2. Otherwise normal MRI of the thoracic and lumbar spine. Electronically Signed   By: Jeannine Boga M.D.   On: 07/04/2021 21:15   MR Lumbar Spine W Wo Contrast  Result Date: 07/04/2021 CLINICAL DATA:  Initial evaluation for severe lower back pain status post recent epidural injection. EXAM: MRI THORACIC AND LUMBAR SPINE WITHOUT AND WITH CONTRAST TECHNIQUE: Multiplanar and multiecho pulse sequences of the thoracic and lumbar spine were obtained without and with intravenous contrast. CONTRAST:  58m GADAVIST GADOBUTROL 1 MMOL/ML IV SOLN COMPARISON:  None available. FINDINGS: MRI THORACIC SPINE FINDINGS Alignment: Physiologic with preservation of the normal thoracic kyphosis. No listhesis. Vertebrae: Vertebral body height maintained without acute or chronic fracture.  Bone marrow signal intensity normal. No discrete or worrisome osseous lesions. No abnormal marrow edema or enhancement. No evidence for osteomyelitis discitis or septic arthritis. Cord: There is a small epidural collection involving the left dorsal lateral aspect of the thecal sac, extending from the T11-12 through L2-3 levels, likely reflecting a small epidural hematoma related to recent spinal epidural injection (series 22, image 39). Collection measures no more than 3 mm in AP diameter at its greatest dimension. Secondary flattening of the left posterior thecal sac without significant spinal stenosis or evidence for cord impingement. Associated mild enhancement. Otherwise, no other epidural collection seen elsewhere within the thoracic spine. Cord itself is normal in caliber and appearance without abnormal enhancement. Paraspinal and other soft tissues: Unremarkable. Disc levels: No significant disc pathology seen within the thoracic spine. No disc bulge or focal disc herniation. No other stenosis or impingement. MRI LUMBAR SPINE FINDINGS Segmentation: Standard. Lowest well-formed disc space labeled the L5-S1 level. Alignment: Physiologic with preservation of the normal lumbar lordosis. No listhesis. Vertebrae: Vertebral body height maintained without acute or chronic fracture. Bone marrow signal intensity within normal limits. No discrete or worrisome osseous lesions. No evidence for osteomyelitis discitis or septic arthritis. Conus medullaris: Extends to the L1 level. Above described epidural collection involving the left dorsal lateral epidural space extends to the level of L2-3. Mild flattening and mass effect on the adjacent thecal sac without significant spinal stenosis. This measures no more than 3 mm in AP diameter at its greatest point in the lumbar spine (series 5, image 7). Associated mild enhancement. Otherwise, conus medullaris and nerve roots of the cauda equina are normal in appearance. Paraspinal  and other soft tissues: Minimal stranding and enhancement seen within the posterior paraspinous soft tissues near the level of L3, likely related to recent epidural injection. No discrete soft tissue collections. Paraspinous soft tissues otherwise unremarkable. Disc levels: No significant disc pathology seen within the lumbar spine. No disc bulge or focal disc herniation. No significant stenosis or impingement. IMPRESSION: 1. Small  epidural collection involving the left dorsolateral epidural space extending from T11-12 through L2-3, likely reflecting a small epidural hematoma related to recent spinal epidural injection. Associated mild enhancement favored to be reactive, although superimposed infection could be considered in the correct clinical setting. No significant spinal stenosis or evidence for cord impingement. 2. Otherwise normal MRI of the thoracic and lumbar spine. Electronically Signed   By: Jeannine Boga M.D.   On: 07/04/2021 21:15    EKG: None performed in MAU.  Assessment/Plan Principal Problem:   Epidural hematoma (HCC) Active Problems:   Type 1 diabetes mellitus with ketoacidosis, uncontrolled ([place])   History of diabetic ketoacidosis   Chronic hypertension with superimposed severe preeclampsia   Vaginal delivery  Back pain Epidural hematoma > Patient with progressive back pain since vaginal delivery 5 days ago. > Pain was noted to be at the site of her epidural and MRI revealed small epidural collection suspicious for hematoma. > Surgery consulted and recommended Decadron with monitoring overnight and they will see the patient formally in the morning. - Monitor telemetry - Appreciate neurosurgery recommendations - Continue with Decadron as per neurosurgery - Pain control as needed with Toradol x 2 doses and Flexeril - No pharmacologic DVT prophylaxis  Type 1 diabetes > Initial glucose stable 105, however she will be receiving Decadron. - Continue with home 70/30  insulin at 6 units twice daily (8U is home dose) - Add on sliding scale insulin   Hypertension > Blood pressure ranging in the 725D to 664Q systolic in the MAU.  Most recently 130s after receiving her nifedipine.  Some the elevation likely due to pain. -Continue home nifedipine twice daily -Pain control as needed  Postpartum > Status post vaginal delivery 5 days ago. > If any issues arise while patient remains in the MAU contact Gavin Pound who is the overnight midwife in the MAU.  Otherwise contact Dr. Elonda Husky who is the on-call OB/GYN.  DVT prophylaxis: SCDs Code Status:   Full  Family Communication:  None on admission.  Patient states that she has updated her husband who knows she is being admitted. Disposition Plan:   Patient is from:  Home  Anticipated DC to:  Home  Anticipated DC date:  1 to 3 days  Anticipated DC barriers: None  Consults called:  Neurosurgery consulted by MAU provider.   Admission status:  Observation, telemetry   Severity of Illness: The appropriate patient status for this patient is OBSERVATION. Observation status is judged to be reasonable and necessary in order to provide the required intensity of service to ensure the patient's safety. The patient's presenting symptoms, physical exam findings, and initial radiographic and laboratory data in the context of their medical condition is felt to place them at decreased risk for further clinical deterioration. Furthermore, it is anticipated that the patient will be medically stable for discharge from the hospital within 2 midnights of admission. The following factors support the patient status of observation.   " The patient's presenting symptoms include back pain. " The physical exam findings include back pain. " The initial radiographic and laboratory data are Lab work-up showed CMP with glucose 105, calcium 8.5, albumin 2.5.  CBC with hemoglobin stable at 10.6.  MRI showed a small epidural collection involving the  dorsal lateral epidural space from around T11-L3.  Likely hematoma.  There is some associated mild enhancement which is favored to be reactive.   Marcelyn Bruins MD [facility]  How to contact the [facility] Attending or Consulting  provider [place] or covering provider during after hours [place], for this patient?   Check the care team in [facility] and look for a) attending/consulting [facility] provider listed and b) the [facility] team listed Log into [email or web address] and use  [place] universal password to access. If you do not have the password, please contact the hospital operator. Locate the [facility] provider you are looking for under [facility] and page to a number that you can be directly reached. If you still have difficulty reaching the provider, please page the [facility] (Director on Call) for the Hospitalists listed on amion for assistance.  07/04/2021, 10:52 PM

## 2021-07-04 NOTE — MAU Provider Note (Signed)
History     CSN: [id number]  Arrival date and time: 07/04/21 7416   Event Date/Time   First Provider Initiated Contact with Patient 07/04/21 1121      Chief Complaint  Patient presents with   Back Pain    Patient states she had a "little back pain" after delivery and severe back pain started 2 days ago 07/02/21" without any cause that she is aware of.    Ms. [name] ibrahim CLEO SANTUCCI is a 35 y.o. [phone] at 39w6dwho presents to MAU for lower back pain. Patient reports the back pain started before she left the hospital after delivery, on 07/02/2021. Patient reports the pain was mild on the day she left the hospital. However, the severe pain started that night. The patient identified the area of pain around the area that her epidural was placed in her back. Patient reports the pain is intermittent. At first the pain would resolve quickly, but now the pain comes and lasts for a longer time than previously, and then will stop for approximately 10 minutes before returning. Patient describes the pain as squeezing and feels like there are "spaces" between the bones in her back. Patient denies anything that makes the pain better or worse. Patient has been using hot compresses to treat the pain, which she reports helps minimally. Patient states she has been taking 6075mibuprofen, which she last took this morning at 830AM. Patient rates pain as 6/10, but reports it is a little bit better at this time, and when the pain is at its strongest it is 10/10.  Patient intermittently screaming on arrival to MAU, but resting comfortably during entire 15-2064mconversation with provider.  Pt denies VB, LOF, ctx, decreased FM, vaginal discharge/odor/itching. Pt denies N/V, abdominal pain, constipation, diarrhea, or urinary problems. Pt denies fever, chills, fatigue, sweating or changes in appetite. Pt denies SOB or chest pain. Pt denies dizziness, HA, light-headedness, weakness.  Problems this pregnancy  include: Type I DM, hx DKA, FGM, HTN. Allergies? pork Current medications/supplements? Ibuprofen, PNV?, nifedipine, iron?, insulin (last took at 130AM this morning) Pregnant/postpartum/breastfeeding? breastfeeding [facility]   OB History     Gravida  2   Para  1   Term  0   Preterm  1   AB  1   Living  1      SAB  1   IAB  0   Ectopic  0   Multiple  0   Live Births  1           OB History     Gravida  2   Para  1   Term  0   Preterm  1   AB  1   Living  1      SAB  1   IAB  0   Ectopic  0   Multiple  0   Live Births  1           Past Medical History:  Diagnosis Date   Chronic hypertension    Complication of anesthesia    with hand surgery, local anes did not work, tried twice- had to put her to sleep   [place]  [place]osis[place]   Infection    UTI   Pyelonephritis 10/2020   [place] I diabetes mellitus ([facility]  dx [id number]    Past Surgical History:  Procedure Laterality Date  female circumcision Bilateral    clitorectomy as well   FOOT SURGERY  2018   HAND SURGERY  2019    Family History  Problem Relation Age of Onset   Hyperlipidemia Mother    Hypertension Mother    Kidney disease Father    Alzheimer's disease Father     Social History   Tobacco Use   Smoking status: Never   Smokeless tobacco: Never  Vaping Use   Vaping Use: Never used  Substance Use Topics   Alcohol use: Never   Drug use: Never    Allergies:  Allergies  Allergen Reactions   Pork-Derived Products     Pt does not want any pork products    Medications Prior to Admission  Medication Sig Dispense Refill Last Dose   cyclobenzaprine (FLEXERIL) 5 MG tablet Take 1 tablet (5 mg total) by mouth at bedtime as needed for muscle spasms. 10 tablet 0 Past Week   ferrous sulfate 325 (65 FE) MG tablet Take 1 tablet (325 mg total) by mouth daily with breakfast.  30 tablet 3 07/03/2021   ibuprofen (ADVIL) 600 MG tablet Take 1 tablet (600 mg total) by mouth every 6 (six) hours. (Patient taking differently: Take 600 mg by mouth every 6 (six) hours as needed for fever, headache or mild pain.) 30 tablet 0 07/04/2021   insulin isophane & regular human KwikPen (HUMULIN 70/30 KWIKPEN) (70-30) 100 UNIT/ML KwikPen Inject 8 Units into the skin 2 (two) times daily. 15 mL 11 07/04/2021 at 0130   NIFEdipine (ADALAT CC) 60 MG 24 hr tablet Take 1 tablet (60 mg total) by mouth 2 (two) times daily. 60 tablet 0 07/04/2021   Prenatal Vit-Fe Fumarate-FA (PRENATAL VITAMIN) 27-0.8 MG TABS Take 1 tablet by mouth daily. 90 tablet 2 07/03/2021   blood glucose meter kit and supplies Dispense based on patient and insurance preference. Use up to four times daily as directed. (FOR ICD-10 E10.9, E11.9). 1 each 0    glucose blood test strip Use as instructed QID 100 each 12    Insulin Pen Needle 32G X 4 MM MISC Use as directed 100 each 2    OneTouch Delica Lancets 35D MISC 1 Device by Does not apply route in the morning, at noon, in the evening, and at bedtime. 100 each 2     Review of Systems  Constitutional:  Negative for chills, diaphoresis, fatigue and fever.  Eyes:  Negative for visual disturbance.  Respiratory:  Negative for shortness of breath.   Cardiovascular:  Negative for chest pain.  Gastrointestinal:  Negative for abdominal pain, constipation, diarrhea, nausea and vomiting.  Genitourinary:  Negative for dysuria, flank pain, frequency, pelvic pain, urgency, vaginal bleeding and vaginal discharge.  Musculoskeletal:  Positive for back pain.  Neurological:  Negative for dizziness, weakness, light-headedness and headaches.   Physical Exam   Blood pressure 139/84, pulse 73, temperature 98.2 F (36.8 C), temperature source Oral, resp. rate 20, SpO2 100 %, currently breastfeeding.  Patient Vitals for the past 24 hrs:  BP Temp Temp src Pulse Resp SpO2  07/04/21 2148 139/84 -- --  73 -- --  07/04/21 1801 140/84 -- -- 94 -- --  07/04/21 1703 (!) 148/82 -- -- 87 -- --  07/04/21 1643 (!) 172/99 -- -- 88 -- --  07/04/21 1602 (!) 159/96 -- -- 86 -- --  07/04/21 1600 (!) 158/97 -- -- 86 -- --  07/04/21 1404 (!) 144/90 98.2 F (36.8 C) Oral 88 20 --  07/04/21 1045  112/72 -- -- 90 -- --  07/04/21 1029 114/86 98.6 F (37 C) Oral (!) 112 20 100 %   Physical Exam Vitals and nursing note reviewed.  Constitutional:      Appearance: Normal appearance.  HENT:     Head: Normocephalic and atraumatic.  Pulmonary:     Effort: Pulmonary effort is normal.  Skin:      Neurological:     Mental Status: She is alert and oriented to person, place, and time.  Psychiatric:        Mood and Affect: Mood normal.        Behavior: Behavior normal.        Thought Content: Thought content normal.        Judgment: Judgment normal.   Results for orders placed or performed during the hospital encounter of 07/04/21 (from the past 24 hour(s))  CBC with Differential/Platelet     Status: Abnormal   Collection Time: 07/04/21  5:04 PM  Result Value Ref Range   WBC 5.1 4.0 - 10.5 K/uL   RBC 3.59 (L) 3.87 - 5.11 MIL/uL   Hemoglobin 10.6 (L) 12.0 - 15.0 g/dL   HCT 31.6 (L) 36.0 - 46.0 %   MCV 88.0 80.0 - 100.0 fL   MCH 29.5 26.0 - 34.0 pg   MCHC 33.5 30.0 - 36.0 g/dL   RDW 13.2 11.5 - 15.5 %   Platelets 328 150 - 400 K/uL   nRBC 0.0 0.0 - 0.2 %   Neutrophils Relative % 59 %   Neutro Abs 3.1 1.7 - 7.7 K/uL   Lymphocytes Relative 28 %   Lymphs Abs 1.4 0.7 - 4.0 K/uL   Monocytes Relative 8 %   Monocytes Absolute 0.4 0.1 - 1.0 K/uL   Eosinophils Relative 3 %   Eosinophils Absolute 0.1 0.0 - 0.5 K/uL   Basophils Relative 1 %   Basophils Absolute 0.0 0.0 - 0.1 K/uL   Immature Granulocytes 1 %   Abs Immature Granulocytes 0.03 0.00 - 0.07 K/uL  Comprehensive metabolic panel     Status: Abnormal   Collection Time: 07/04/21  5:04 PM  Result Value Ref Range   Sodium 137 135 - 145 mmol/L    Potassium 3.7 3.5 - 5.1 mmol/L   Chloride 108 98 - 111 mmol/L   CO2 22 22 - 32 mmol/L   Glucose, Bld 105 (H) 70 - 99 mg/dL   BUN 12 6 - 20 mg/dL   Creatinine, Ser 0.38 (L) 0.44 - 1.00 mg/dL   Calcium 8.5 (L) 8.9 - 10.3 mg/dL   Total Protein 6.7 6.5 - 8.1 g/dL   Albumin 2.5 (L) 3.5 - 5.0 g/dL   AST 11 (L) 15 - 41 U/L   ALT 13 0 - 44 U/L   Alkaline Phosphatase 87 38 - 126 U/L   Total Bilirubin 0.8 0.3 - 1.2 mg/dL   GFR, Estimated >60 >60 mL/min   Anion gap 7 5 - 15    MR THORACIC SPINE W WO CONTRAST  Result Date: 07/04/2021 CLINICAL DATA:  Initial evaluation for severe lower back pain status post recent epidural injection. EXAM: MRI THORACIC AND LUMBAR SPINE WITHOUT AND WITH CONTRAST TECHNIQUE: Multiplanar and multiecho pulse sequences of the thoracic and lumbar spine were obtained without and with intravenous contrast. CONTRAST:  36mL GADAVIST GADOBUTROL 1 MMOL/ML IV SOLN COMPARISON:  None available. FINDINGS: MRI THORACIC SPINE FINDINGS Alignment: Physiologic with preservation of the normal thoracic kyphosis. No listhesis. Vertebrae: Vertebral body height maintained without  acute or chronic fracture. Bone marrow signal intensity normal. No discrete or worrisome osseous lesions. No abnormal marrow edema or enhancement. No evidence for osteomyelitis discitis or septic arthritis. Cord: There is a small epidural collection involving the left dorsal lateral aspect of the thecal sac, extending from the T11-12 through L2-3 levels, likely reflecting a small epidural hematoma related to recent spinal epidural injection (series 22, image 39). Collection measures no more than 3 mm in AP diameter at its greatest dimension. Secondary flattening of the left posterior thecal sac without significant spinal stenosis or evidence for cord impingement. Associated mild enhancement. Otherwise, no other epidural collection seen elsewhere within the thoracic spine. Cord itself is normal in caliber and appearance without  abnormal enhancement. Paraspinal and other soft tissues: Unremarkable. Disc levels: No significant disc pathology seen within the thoracic spine. No disc bulge or focal disc herniation. No other stenosis or impingement. MRI LUMBAR SPINE FINDINGS Segmentation: Standard. Lowest well-formed disc space labeled the L5-S1 level. Alignment: Physiologic with preservation of the normal lumbar lordosis. No listhesis. Vertebrae: Vertebral body height maintained without acute or chronic fracture. Bone marrow signal intensity within normal limits. No discrete or worrisome osseous lesions. No evidence for osteomyelitis discitis or septic arthritis. Conus medullaris: Extends to the L1 level. Above described epidural collection involving the left dorsal lateral epidural space extends to the level of L2-3. Mild flattening and mass effect on the adjacent thecal sac without significant spinal stenosis. This measures no more than 3 mm in AP diameter at its greatest point in the lumbar spine (series 5, image 7). Associated mild enhancement. Otherwise, conus medullaris and nerve roots of the cauda equina are normal in appearance. Paraspinal and other soft tissues: Minimal stranding and enhancement seen within the posterior paraspinous soft tissues near the level of L3, likely related to recent epidural injection. No discrete soft tissue collections. Paraspinous soft tissues otherwise unremarkable. Disc levels: No significant disc pathology seen within the lumbar spine. No disc bulge or focal disc herniation. No significant stenosis or impingement. IMPRESSION: 1. Small epidural collection involving the left dorsolateral epidural space extending from T11-12 through L2-3, likely reflecting a small epidural hematoma related to recent spinal epidural injection. Associated mild enhancement favored to be reactive, although superimposed infection could be considered in the correct clinical setting. No significant spinal stenosis or evidence for  cord impingement. 2. Otherwise normal MRI of the thoracic and lumbar spine. Electronically Signed   By: Jeannine Boga M.D.   On: 07/04/2021 21:15   MR Lumbar Spine W Wo Contrast  Result Date: 07/04/2021 CLINICAL DATA:  Initial evaluation for severe lower back pain status post recent epidural injection. EXAM: MRI THORACIC AND LUMBAR SPINE WITHOUT AND WITH CONTRAST TECHNIQUE: Multiplanar and multiecho pulse sequences of the thoracic and lumbar spine were obtained without and with intravenous contrast. CONTRAST:  60m GADAVIST GADOBUTROL 1 MMOL/ML IV SOLN COMPARISON:  None available. FINDINGS: MRI THORACIC SPINE FINDINGS Alignment: Physiologic with preservation of the normal thoracic kyphosis. No listhesis. Vertebrae: Vertebral body height maintained without acute or chronic fracture. Bone marrow signal intensity normal. No discrete or worrisome osseous lesions. No abnormal marrow edema or enhancement. No evidence for osteomyelitis discitis or septic arthritis. Cord: There is a small epidural collection involving the left dorsal lateral aspect of the thecal sac, extending from the T11-12 through L2-3 levels, likely reflecting a small epidural hematoma related to recent spinal epidural injection (series 22, image 39). Collection measures no more than 3 mm in AP diameter at its  greatest dimension. Secondary flattening of the left posterior thecal sac without significant spinal stenosis or evidence for cord impingement. Associated mild enhancement. Otherwise, no other epidural collection seen elsewhere within the thoracic spine. Cord itself is normal in caliber and appearance without abnormal enhancement. Paraspinal and other soft tissues: Unremarkable. Disc levels: No significant disc pathology seen within the thoracic spine. No disc bulge or focal disc herniation. No other stenosis or impingement. MRI LUMBAR SPINE FINDINGS Segmentation: Standard. Lowest well-formed disc space labeled the L5-S1 level.  Alignment: Physiologic with preservation of the normal lumbar lordosis. No listhesis. Vertebrae: Vertebral body height maintained without acute or chronic fracture. Bone marrow signal intensity within normal limits. No discrete or worrisome osseous lesions. No evidence for osteomyelitis discitis or septic arthritis. Conus medullaris: Extends to the L1 level. Above described epidural collection involving the left dorsal lateral epidural space extends to the level of L2-3. Mild flattening and mass effect on the adjacent thecal sac without significant spinal stenosis. This measures no more than 3 mm in AP diameter at its greatest point in the lumbar spine (series 5, image 7). Associated mild enhancement. Otherwise, conus medullaris and nerve roots of the cauda equina are normal in appearance. Paraspinal and other soft tissues: Minimal stranding and enhancement seen within the posterior paraspinous soft tissues near the level of L3, likely related to recent epidural injection. No discrete soft tissue collections. Paraspinous soft tissues otherwise unremarkable. Disc levels: No significant disc pathology seen within the lumbar spine. No disc bulge or focal disc herniation. No significant stenosis or impingement. IMPRESSION: 1. Small epidural collection involving the left dorsolateral epidural space extending from T11-12 through L2-3, likely reflecting a small epidural hematoma related to recent spinal epidural injection. Associated mild enhancement favored to be reactive, although superimposed infection could be considered in the correct clinical setting. No significant spinal stenosis or evidence for cord impingement. 2. Otherwise normal MRI of the thoracic and lumbar spine. Electronically Signed   By: Jeannine Boga M.D.   On: 07/04/2021 21:15   [place] MFM FETAL BPP W/NONSTRESS  Result Date: 06/23/2021 ----------------------------------------------------------------------  OBSTETRICS REPORT                        (Signed Final 06/23/2021 05:49 pm) ---------------------------------------------------------------------- Patient Info  ID #:       [id number]                          D.O.B.:  19-Dec-1985 (35 yrs)  Name:       Lind Covert                Visit Date: 06/23/2021 07:39 am              [place] ---------------------------------------------------------------------- Performed By  Attending:        Johnell Comings MD         Secondary Phy.:   Graciela Husbands CNM  Performed By:     Jacob Moores BS,       Address:          [address]  [place], [address]                                                             [place], [place]  Referred By:      [place]          Location:         [facility]                                [facility] at                                                             [facility]                                                             [facility]  Ref. Address:     [address]                    [place], [place] ---------------------------------------------------------------------- Orders  #  Description                           Code        Ordered By  1  [place] MFM UA CORD DOPPLER                [id number]    Tama High  2  [place] MFM FETAL BPP                      48185.6     RAVI SHANKAR     W/NONSTRESS ----------------------------------------------------------------------  #  Order #                     Accession #  Episode #  1  [id number]                   [id number]                 [id number]  2  [id number]                   [id number]                 [id number] ---------------------------------------------------------------------- Indications  Maternal care for known or suspected poor      O36.5930  fetal growth,  third trimester, not applicable or  unspecified IUGR  Advanced maternal age multigravida 34+,        O76.523  third trimester  Hypertension - Chronic with superimposed       O11.9 O10.919  preeclampsia (Labetalol)  Pre-existing diabetes, type 1, in pregnancy,   O24.013  third trimester  [redacted] weeks gestation of pregnancy                Z3A.33  Insufficient Prenatal Care (transferred from   O23.30  [place]) ---------------------------------------------------------------------- Fetal Evaluation  Num Of Fetuses:         1  Fetal Heart Rate(bpm):  131  Cardiac Activity:       Observed  Presentation:           Breech  Placenta:               Anterior  P. Cord Insertion:      Previously Visualized  Amniotic Fluid  AFI FV:      Within normal limits  AFI Sum(cm)     %Tile       Largest Pocket(cm)  19.8            74          7.3  RUQ(cm)       RLQ(cm)       LUQ(cm)        LLQ(cm)  2.1           6.4           7.3            4 ---------------------------------------------------------------------- Biophysical Evaluation  Amniotic F.V:   Pocket => 2 cm             F. Tone:        Observed  F. Movement:    Observed                   N.S.T:          Nonreactive  F. Breathing:   Observed                   Score:          8/10 ---------------------------------------------------------------------- OB History  Gravidity:    2         Term:   0        Prem:   0        SAB:   1  TOP:          0       Ectopic:  0        Living: 0 ---------------------------------------------------------------------- Gestational Age  LMP:           34w 6d        Date:  10/22/20                 EDD:  07/29/21  Best:          33w 0d     Det. By:  U/S  (04/11/21)          EDD:   08/11/21 ---------------------------------------------------------------------- Anatomy  Cranium:               Previously seen        Aortic Arch:            Previously seen  Cavum:                 Previously seen        Ductal Arch:            Previously seen  Ventricles:             Previously seen        Diaphragm:              Previously seen  Choroid Plexus:        Previously seen        Stomach:                Previously Seen  Cerebellum:            Previously seen        Abdomen:                Previously seen  Posterior Fossa:       Previously seen        Abdominal Wall:         Previously seen  Nuchal Fold:           Not applicable (>54    Cord Vessels:           Previously seen                         wks GA)  Face:                  Orbits and profile     Kidneys:                Previously seen                         previously seen  Lips:                  Previously seen        Bladder:                Previously seen  Thoracic:              Previously seen        Spine:                  Previously seen  Heart:                 Previously seen        Upper Extremities:      Previously seen  RVOT:                  Previously seen        Lower Extremities:      Previously seen  LVOT:                  Previously seen  Other:  Female gender previously visualized. Heels, Nasal bone, Right open  hand, and IVC/SVC prev. visualized. Technically difficult due to fetal          position. ---------------------------------------------------------------------- Doppler - Fetal Vessels  Umbilical Artery   S/D     %tile      RI    %tile      PI    %tile            ADFV    RDFV   3.06       76    0.67       78    1.05       80               No      No ---------------------------------------------------------------------- Cervix Uterus Adnexa  Cervix  Not visualized (advanced GA >24wks)  Uterus  No abnormality visualized.  Cul De Sac  No free fluid seen.  Adnexa  No abnormality visualized. ---------------------------------------------------------------------- Comments  Cheron Every was seen for a biophysical profile due to  chronic hypertension treated with labetalol and pregestational  diabetes treated with insulin.  She was diagnosed with  preeclampsia few weeks ago when her P/C ratio  indicated  new onset proteinuria.  IUGR has also been noted in her  current pregnancy.  Her last growth scan two weeks ago  showed an EFW of 3 pounds 3 ounces (9th percentile for her  gestational age).  The patient reports that she has experienced a headache and  upper abdominal pain for the past 3 days.  Her blood  pressures in our office today were 163/92 and 162/95.  A biophysical profile performed today was 8 out of 8.  She  also had a nonreactive nonstress test making her total  biophysical profile score 8 out of 10.  There was normal  amniotic fluid noted today.  Doppler studies of the umbilical arteries performed today  showed a normal S/D ratio of 3.06.  There were no signs of  absent or reversed diastolic flow.  Due to her elevated blood pressures and symptoms along  with the fact that she has been diagnosed with preeclampsia,  the patient was sent to the hospital for admission to receive a  complete course of antenatal corticosteroids and  observation/delivery.  Due to preeclampsia with fetal growth restriction, I would  recommend inpatient management until delivery.  She may  continue the antihypertensive medications (labetalol) for  blood pressure control.  As she is complaining of a headache,I would recommend that  she be continued on magnesium sulfate for maternal seizure  prophylaxis until her antenatal corticosteroid course is  completed.  The goal for her delivery would be 34 weeks.  Delivery prior to 34 weeks would be recommended if her  blood pressures remain persistently greater than 160/100  despite medication treatment, should she require IV push  antihypertensive medication for blood pressure control,  should she complain of a persistent severe headache, should  her liver function tests increase or platelet counts decrease,  or should there be nonreassuring fetal status.  All conversations were held with the patient today with the  help of an Arabic interpreter.  Recommendations:  Inpatient  management until delivery  Continue magnesium sulfate for maternal seizure prophylaxis  until her antenatal corticosteroid course is completed  Continue labetalol for blood pressure control  Delivery at 34 weeks  Delivery prior to 34 weeks would be indicated:           Should her blood pressures be persistently greater  than 160/100  despite treatment           Should she require IV push medication for blood  pressure control           Should she complain of any signs and symptoms of  severe preeclampsia           Should her [place] labs be abnormal           At any time for nonreassuring fetal status  A total of 30 minutes was spent counseling and coordinating  the care for this patient. ----------------------------------------------------------------------                   Johnell Comings, MD Electronically Signed Final Report   06/23/2021 05:49 pm ----------------------------------------------------------------------  [place] MFM FETAL BPP W/NONSTRESS  Result Date: 06/16/2021 ----------------------------------------------------------------------  OBSTETRICS REPORT                       (Signed Final 06/16/2021 09:19 am) ---------------------------------------------------------------------- Patient Info  ID #:       [id number]                          D.O.B.:  08/17/1986 (34 yrs)  Name:       Lind Covert                Visit Date: 06/16/2021 07:33 am              [place] ---------------------------------------------------------------------- Performed By  Attending:        Tama High MD        Secondary Phy.:   Graciela Husbands CNM  Performed By:     Lelan Pons RDMS       Address:          [place], [place]  Referred By:      [facility] MAU/Triage          Location:         [facility]                                                             [place] for                                                             Women ---------------------------------------------------------------------- Orders  #  Description                           Code        Ordered By  1  [place] MFM UA CORD DOPPLER                76820.02    RAVI SHANKAR  2  [place] MFM FETAL BPP                      54650.3     RAVI [facility]     W/NONSTRESS ----------------------------------------------------------------------  #  Order #                     Accession #                Episode #  1  [id number]                   [id number]                 [id number]  2  [id number]                   [id number]                 [id number] ---------------------------------------------------------------------- Indications  [redacted] weeks gestation of pregnancy                Z3A.32  Pre-existing diabetes, type 1, in pregnancy,   O24.013  third trimester  Maternal care for known or suspected poor      O36.5930  fetal growth, third trimester, not applicable or  unspecified IUGR  Insufficient Prenatal Care (transferred from   O28.30  [place])  Hypertension - Chronic with superimposed       O11.9 O10.919  preeclampsia (Labetalol)  Advanced maternal age multigravida 85+,        O87.523  third trimester ---------------------------------------------------------------------- Fetal Evaluation  Num Of Fetuses:         1  Fetal Heart Rate(bpm):  138  Cardiac Activity:       Observed  Presentation:           Breech  Placenta:               Anterior  P. Cord Insertion:      Previously Visualized  Amniotic Fluid  AFI FV:      Within normal limits  AFI Sum(cm)     %Tile       Largest Pocket(cm)  22.91           89          7.47  RUQ(cm)  RLQ(cm)       LUQ(cm)        LLQ(cm)  5.07          5             7.47           5.37  ---------------------------------------------------------------------- Biophysical Evaluation  Amniotic F.V:   Within normal limits       F. Tone:        Observed  F. Movement:    Observed                   N.S.T:          Reactive  F. Breathing:   Observed                   Score:          10/10 ---------------------------------------------------------------------- Biometry  LV:          5  mm ---------------------------------------------------------------------- OB History  Gravidity:    2         Term:   0        Prem:   0        SAB:   1  TOP:          0       Ectopic:  0        Living: 0 ---------------------------------------------------------------------- Gestational Age  LMP:           33w 6d        Date:  10/22/20                 EDD:   07/29/21  Best:          Milderd Meager 0d     Det. By:  U/S  (04/11/21)          EDD:   08/11/21 ---------------------------------------------------------------------- Anatomy  Ventricles:            Appears normal         Kidneys:                Appear normal  Heart:                 Appears normal         Bladder:                Appears normal                         (4CH, axis, and                         situs)  Stomach:               Appears normal, left                         sided ---------------------------------------------------------------------- Doppler - Fetal Vessels  Umbilical Artery   S/D     %tile      RI    %tile      PI    %tile            ADFV    RDFV   2.48       38     0.6       45    0.91       52  No      No ---------------------------------------------------------------------- Cervix Uterus Adnexa  Cervix  Not visualized (advanced GA >24wks)  Right Ovary  Not visualized.  Left Ovary  Not visualized. ---------------------------------------------------------------------- Impression  Fetal growth restriction.  On ultrasound performed last week,  the estimated fetal weight was at the 9th percentile.  Patient has type 1 diabetes and takes insulin for  control.  Fetal echocardiography performed earlier was normal.  She has chronic hypertension and takes labetalol.  Patient  did not take her morning dose yet.  Blood pressure is today at  her office was 160/88 mmHg and repeat 146/84 mmHg.  She  reports she has been having intermittent headaches.  No  Amniotic fluid is normal good fetal activity seen.  Antenatal  testing is reassuring.  Cephalic presentation.  Umbilical artery  Doppler showed normal forward diastolic flow.  NST is  reactive.  BPP 10/10.  I counseled the patient with help of language interpreter  present in the room.  I encouraged her to go to the MAU if  she has severe headaches or visual disturbances or right  upper quadrant pain. ---------------------------------------------------------------------- Recommendations  -Continue weekly BPP and UA Doppler till delivery. ----------------------------------------------------------------------                  Tama High, MD Electronically Signed Final Report   06/16/2021 09:19 am ----------------------------------------------------------------------  [place] MFM FETAL BPP WO NON STRESS  Result Date: 06/26/2021 ----------------------------------------------------------------------  OBSTETRICS REPORT                       (Signed Final 06/26/2021 01:26 pm) ---------------------------------------------------------------------- Patient Info  ID #:       [id number]                          D.O.B.:  1986-04-22 (35 yrs)  Name:       Lind Covert                Visit Date: 06/26/2021 07:09 am              [place] ---------------------------------------------------------------------- Performed By  Attending:        Johnell Comings MD         Secondary Phy.:   Graciela Husbands CNM  Performed By:     Germain Osgood            Address:          [address]  [place], [place]  Referred By:      [facility]          Location:         [facility] [place]  Ref. Address:     [address]                    [place], [place] ---------------------------------------------------------------------- Orders  #  Description                           Code        Ordered By  1  [place] MFM FETAL BPP WO NON               [id number]    Janyth Pupa     STRESS  2  [place] MFM UA CORD DOPPLER                [id number]    Janyth Pupa ----------------------------------------------------------------------  #  Order #                     Accession #                Episode #  1  [id number]                   [id number]                 [id number]  2  [id number]                   [id number]                 [id number] ---------------------------------------------------------------------- Indications  Maternal care for known or suspected poor      O36.5930  fetal growth, third trimester, not applicable or  unspecified IUGR  Advanced maternal age multigravida 51+,        O18.523  third trimester  Hypertension - Chronic with superimposed       O11.9 O10.919  preeclampsia (Labetalol)  Pre-existing diabetes, type 1, in pregnancy,   O24.013  third trimester  Insufficient Prenatal Care (transferred from   O09.30  [place])  [redacted] weeks gestation of pregnancy                Z3A.33 ---------------------------------------------------------------------- Fetal Evaluation  Num Of Fetuses:         1  Fetal Heart Rate(bpm):  150  Cardiac Activity:       Observed  Presentation:           Cephalic  Placenta:               Anterior  P. Cord  Insertion:      Visualized, central  Amniotic Fluid  AFI FV:      Within normal limits  AFI Sum(cm)     %Tile       Largest Pocket(cm)  12.3            35          5.2  RUQ(cm)       RLQ(cm)        LUQ(cm)        LLQ(cm)  1.4           1.6           4.1            5.2 ---------------------------------------------------------------------- Biophysical Evaluation  Amniotic F.V:   Within normal limits       F. Tone:        Observed  F. Movement:    Observed                   Score:          8/8  F. Breathing:   Observed ---------------------------------------------------------------------- Biometry  LV:        1.1  mm ---------------------------------------------------------------------- OB History  Gravidity:    2         Term:   0        Prem:   0        SAB:   1  TOP:          0       Ectopic:  0        Living: 0 ---------------------------------------------------------------------- Gestational Age  LMP:           35w 2d        Date:  10/22/20                 EDD:   07/29/21  Best:          33w 3d     Det. By:  U/S  (04/11/21)          EDD:   08/11/21 ---------------------------------------------------------------------- Anatomy  Ventricles:            Appears normal         Kidneys:                Appear normal  Diaphragm:             Appears normal         Bladder:                Appears normal  Stomach:               Appears normal, left                         sided ---------------------------------------------------------------------- Doppler - Fetal Vessels  Umbilical Artery   S/D     %tile      RI    %tile      PI    %tile            ADFV    RDFV   2.38       37    0.58       42    0.83       41               No      No ---------------------------------------------------------------------- Cervix Uterus Adnexa  Cervix  Not visualized (advanced GA >24wks) ---------------------------------------------------------------------- Comments  This patient has been hospitalized due to IUGR and  superimposed preeclampsia.  She has a history of chronic  hypertension and pregestational diabetes.  A biophysical profile performed today was 8 out of 8.  There was normal amniotic fluid noted on today's ultrasound  exam.   Doppler studies of the umbilical arteries performed today  showed a normal S/D ratio of 2.38.  There were no signs of  absent or reversed end-diastolic flow.  She is already scheduled for a cesarean delivery on Monday  June 30, 2021. ----------------------------------------------------------------------                   Johnell Comings, MD Electronically Signed Final Report   06/26/2021 01:26 pm ----------------------------------------------------------------------  [place] MFM OB FOLLOW UP  Result Date: 06/09/2021 ----------------------------------------------------------------------  OBSTETRICS REPORT                       (Signed Final 06/09/2021 11:01 am) ---------------------------------------------------------------------- Patient Info  ID #:       [id number]                          D.O.B.:  January 11, 1986 (34 yrs)  Name:       Lind Covert                Visit Date: 06/09/2021 09:15 am              [place] ---------------------------------------------------------------------- Performed By  Attending:        Tama High MD        Secondary Phy.:   Graciela Husbands CNM  Performed By:     Germain Osgood            Address:          [address]                                                             [place], [place]  Referred By:      [facility] MAU/Triage  Location:         [facility]                                                             [place] for                                                             Women ---------------------------------------------------------------------- Orders  #  Description                           Code        Ordered By  1  [place] MFM OB FOLLOW UP                   76816.01     RAVI SHANKAR  2  [place] MFM UA CORD DOPPLER                76820.02    RAVI [facility] ----------------------------------------------------------------------  #  Order #                     Accession #                Episode #  1  [id number]                   [id number]                 [id number]  2  [id number]                   [id number]                 [id number] ---------------------------------------------------------------------- Indications  Pre-existing diabetes, type 1, in pregnancy,   O24.012  second trimester  Maternal care for known or suspected poor      O36.5920  fetal growth, second trimester, not applicable  or unspecified IUGR  Insufficient Prenatal Care (transferred from   O09.30  [place])  [redacted] weeks gestation of pregnancy                Z3A.31  Hypertension - Chronic with superimposed       O11.9 O10.919  preeclampsia (Labetalol) ---------------------------------------------------------------------- Fetal Evaluation  Num Of Fetuses:         1  Fetal Heart Rate(bpm):  143  Cardiac Activity:       Observed  Presentation:           Cephalic  Placenta:               Anterior  P. Cord Insertion:      Visualized, central  Amniotic Fluid  AFI FV:  Within normal limits  AFI Sum(cm)     %Tile       Largest Pocket(cm)  21.32           83          6.19  RUQ(cm)       RLQ(cm)       LUQ(cm)        LLQ(cm)  6.19          4.7           5.38           5.05 ---------------------------------------------------------------------- Biometry  BPD:      77.6  mm     G. Age:  31w 1d         43  %    CI:        79.96   %    70 - 86                                                          FL/HC:      20.1   %    19.3 - 21.3  HC:      274.2  mm     G. Age:  30w 0d        3.1  %    HC/AC:      1.06        0.96 - 1.17  AC:      257.8  mm     G. Age:  30w 0d         18  %    FL/BPD:     71.1   %    71 - 87  FL:       55.2  mm     G. Age:  29w 1d        3.6  %    FL/AC:      21.4   %    20 - 24  HUM:      51.2  mm     G. Age:  30w 0d          30  %  LV:        3.9  mm  Est. FW:    1450  gm      3 lb 3 oz      9  % ---------------------------------------------------------------------- OB History  Gravidity:    2         Term:   0        Prem:   0        SAB:   1  TOP:          0       Ectopic:  0        Living: 0 ---------------------------------------------------------------------- Gestational Age  LMP:           32w 6d        Date:  10/22/20                 EDD:   07/29/21  U/S Today:     30w 1d  EDD:   08/17/21  Best:          Burke Keels 0d     Det. By:  U/S  (04/11/21)          EDD:   08/11/21 ---------------------------------------------------------------------- Anatomy  Cranium:               Appears normal         Aortic Arch:            Previously seen  Cavum:                 Appears normal         Ductal Arch:            Previously seen  Ventricles:            Appears normal         Diaphragm:              Appears normal  Choroid Plexus:        Previously seen        Stomach:                Appears normal, left                                                                        sided  Cerebellum:            Previously seen        Abdomen:                Previously seen  Posterior Fossa:       Previously seen        Abdominal Wall:         Previously seen  Nuchal Fold:           Not applicable (>27    Cord Vessels:           Previously seen                         wks GA)  Face:                  Orbits and profile     Kidneys:                Appear normal                         previously seen  Lips:                  Previously seen        Bladder:                Appears normal  Thoracic:              Previously seen        Spine:                  Previously seen  Heart:                 Appears normal         Upper Extremities:      Previously  seen                         (4CH, axis, and                         situs)  RVOT:                  Previously seen        Lower Extremities:      Previously seen  LVOT:                   Previously seen  Other:  Female gender previously visualized. Heels previously visualized.          Nasal bone previously visualized. Right open hand visualized.          IVC/SVC prev visualized. Technically difficult due to fetal position. ---------------------------------------------------------------------- Doppler - Fetal Vessels  Umbilical Artery   S/D     %tile      RI    %tile      PI    %tile            ADFV    RDFV   2.55       37    0.61       40    0.89       41               No      No ---------------------------------------------------------------------- Cervix Uterus Adnexa  Cervix  Normal appearance by transabdominal scan.  Uterus  No abnormality visualized. ---------------------------------------------------------------------- Impression  Patient returned for fetal growth assessment.  She has type 1 diabetes.  She takes Novolin 12 units in the  morning and 12 units at night and NovoLog 5 units with each  meals.  She reports her fasting levels are between 95 and  100 mg/DL.  Fetal echocardiography performed earlier was reported as  normal.  Chronic hypertension.  Well-controlled on labetalol.  Blood  pressure today at her office is 137/89 mmHg.  Her pregnancy is dated by first ultrasound performed at our  office at [redacted] weeks gestation.  On today's ultrasound, the estimated fetal weight is at the 9th  percentile.  Abdominal circumference measurement is at the  18th percentile.  Amniotic fluid is normal and good fetal  activity seen.  Cephalic presentation.  Placenta is anterior.  There is no free-floating umbilical cord below the presenting  part (fetal head).  Umbilical artery Doppler showed normal forward diastolic flow  I explained the finding of fetal growth restriction that is difficult  to differentiate from a constitutionally small fetus.  I explained  our ultrasound protocol of weekly fetal monitoring.  I encouraged the patient to bring her blood glucose log at her  ultrasound  visits.  I advised her to increase bedtime Novolin  to 14 units. ---------------------------------------------------------------------- Recommendations  -Appointments were made for weekly BPP, UA Doppler and  NST.  -Fetal growth in 3 weeks.  -Patient to bring her blood glucose log at her ultrasound visits. ----------------------------------------------------------------------                  Tama High, MD Electronically Signed Final Report   06/09/2021 11:01 am ----------------------------------------------------------------------  [place] MFM UA CORD DOPPLER  Result Date: 06/26/2021 ----------------------------------------------------------------------  OBSTETRICS REPORT                       (  Signed Final 06/26/2021 01:26 pm) ---------------------------------------------------------------------- Patient Info  ID #:       [id number]                          D.O.B.:  April 30, 1986 (35 yrs)  Name:       Lind Covert                Visit Date: 06/26/2021 07:09 am              [place] ---------------------------------------------------------------------- Performed By  Attending:        Johnell Comings MD         Secondary Phy.:   Graciela Husbands CNM  Performed By:     Germain Osgood            Address:          [address]                                                             [place], [place]  Referred By:      [place]          Location:         [facility] [place]  Ref. Address:     [address]                    [place], [place] ---------------------------------------------------------------------- Orders  #  Description                           Code        Ordered By  1  [place] MFM FETAL BPP WO NON                76819.01    JENNIFER OZAN     STRESS  2  [place] MFM UA CORD DOPPLER                98264.15    Janyth Pupa ----------------------------------------------------------------------  #  Order #                     Accession #                Episode #  1  [id number]                   [id number]                 [id number]  2  [id number]                   [id number]                 [id number] ---------------------------------------------------------------------- Indications  Maternal care for known or suspected poor      O36.5930  fetal growth, third trimester, not applicable or  unspecified IUGR  Advanced maternal age multigravida 1+,        O80.523  third trimester  Hypertension - Chronic with superimposed       O11.9 O10.919  preeclampsia (Labetalol)  Pre-existing diabetes, type 1, in pregnancy,   O24.013  third trimester  Insufficient Prenatal Care (transferred from   O09.30  [place])  [redacted] weeks gestation of pregnancy                Z3A.33 ---------------------------------------------------------------------- Fetal Evaluation  Num Of Fetuses:         1  Fetal Heart Rate(bpm):  150  Cardiac Activity:       Observed  Presentation:           Cephalic  Placenta:               Anterior  P. Cord Insertion:      Visualized, central  Amniotic Fluid  AFI FV:      Within normal limits  AFI Sum(cm)     %Tile       Largest Pocket(cm)  12.3            35          5.2  RUQ(cm)       RLQ(cm)       LUQ(cm)        LLQ(cm)  1.4           1.6           4.1            5.2 ---------------------------------------------------------------------- Biophysical Evaluation  Amniotic F.V:   Within normal limits       F. Tone:        Observed  F. Movement:    Observed                   Score:          8/8  F. Breathing:   Observed ---------------------------------------------------------------------- Biometry  LV:        1.1  mm ---------------------------------------------------------------------- OB History  Gravidity:    2         Term:   0         Prem:   0  SAB:   1  TOP:          0       Ectopic:  0        Living: 0 ---------------------------------------------------------------------- Gestational Age  LMP:           35w 2d        Date:  10/22/20                 EDD:   07/29/21  Best:          33w 3d     Det. By:  U/S  (04/11/21)          EDD:   08/11/21 ---------------------------------------------------------------------- Anatomy  Ventricles:            Appears normal         Kidneys:                Appear normal  Diaphragm:             Appears normal         Bladder:                Appears normal  Stomach:               Appears normal, left                         sided ---------------------------------------------------------------------- Doppler - Fetal Vessels  Umbilical Artery   S/D     %tile      RI    %tile      PI    %tile            ADFV    RDFV   2.38       37    0.58       42    0.83       41               No      No ---------------------------------------------------------------------- Cervix Uterus Adnexa  Cervix  Not visualized (advanced GA >24wks) ---------------------------------------------------------------------- Comments  This patient has been hospitalized due to IUGR and  superimposed preeclampsia.  She has a history of chronic  hypertension and pregestational diabetes.  A biophysical profile performed today was 8 out of 8.  There was normal amniotic fluid noted on today's ultrasound  exam.  Doppler studies of the umbilical arteries performed today  showed a normal S/D ratio of 2.38.  There were no signs of  absent or reversed end-diastolic flow.  She is already scheduled for a cesarean delivery on Monday  June 30, 2021. ----------------------------------------------------------------------                   Johnell Comings, MD Electronically Signed Final Report   06/26/2021 01:26 pm ----------------------------------------------------------------------  [place] MFM UA CORD DOPPLER  Result Date:  06/23/2021 ----------------------------------------------------------------------  OBSTETRICS REPORT                       (Signed Final 06/23/2021 05:49 pm) ---------------------------------------------------------------------- Patient Info  ID #:       [id number]                          D.O.B.:  31-Aug-1986 (35 yrs)  Name:       Lind Covert  Visit Date: 06/23/2021 07:39 am              [place] ---------------------------------------------------------------------- Performed By  Attending:        Johnell Comings MD         Secondary Phy.:   Graciela Husbands CNM  Performed By:     Jacob Moores BS,       Address:          [address], [place], [place]  Referred By:      [place]          Location:         [facility]                                [facility] at                                                             [facility]                                                             [facility]  Ref. Address:     [address]                    [place], [place]  [address] ---------------------------------------------------------------------- Orders  #  Description                           Code        Ordered By  1  [place] MFM UA CORD DOPPLER                76820.02    RAVI SHANKAR  2  [place] MFM FETAL BPP                      60454.0     RAVI [facility]     W/NONSTRESS ----------------------------------------------------------------------  #  Order #                     Accession #                Episode #  1  [id number]                   [id number]                 [id number]  2  [id number]                   [id number]                 [id number]  ---------------------------------------------------------------------- Indications  Maternal care for known or suspected poor      O36.5930  fetal growth, third trimester, not applicable or  unspecified IUGR  Advanced maternal age multigravida 36+,        O49.523  third trimester  Hypertension - Chronic with superimposed       O11.9 O10.919  preeclampsia (Labetalol)  Pre-existing diabetes, type 1, in pregnancy,   O24.013  third trimester  [redacted] weeks gestation of pregnancy                Z3A.33  Insufficient Prenatal Care (transferred from   O59.30  [place]) ---------------------------------------------------------------------- Fetal Evaluation  Num Of Fetuses:         1  Fetal Heart Rate(bpm):  131  Cardiac Activity:       Observed  Presentation:           Breech  Placenta:               Anterior  P. Cord Insertion:      Previously Visualized  Amniotic Fluid  AFI FV:      Within normal limits  AFI Sum(cm)     %Tile       Largest Pocket(cm)  19.8            74          7.3  RUQ(cm)       RLQ(cm)       LUQ(cm)        LLQ(cm)  2.1           6.4           7.3            4 ---------------------------------------------------------------------- Biophysical Evaluation  Amniotic F.V:   Pocket => 2 cm             F. Tone:        Observed  F. Movement:    Observed                   N.S.T:  Nonreactive  F. Breathing:   Observed                   Score:          8/10 ---------------------------------------------------------------------- OB History  Gravidity:    2         Term:   0        Prem:   0        SAB:   1  TOP:          0       Ectopic:  0        Living: 0 ---------------------------------------------------------------------- Gestational Age  LMP:           34w 6d        Date:  10/22/20                 EDD:   07/29/21  Best:          33w 0d     Det. By:  U/S  (04/11/21)          EDD:   08/11/21 ---------------------------------------------------------------------- Anatomy  Cranium:               Previously seen         Aortic Arch:            Previously seen  Cavum:                 Previously seen        Ductal Arch:            Previously seen  Ventricles:            Previously seen        Diaphragm:              Previously seen  Choroid Plexus:        Previously seen        Stomach:                Previously Seen  Cerebellum:            Previously seen        Abdomen:                Previously seen  Posterior Fossa:       Previously seen        Abdominal Wall:         Previously seen  Nuchal Fold:           Not applicable (>63    Cord Vessels:           Previously seen                         wks GA)  Face:                  Orbits and profile     Kidneys:                Previously seen                         previously seen  Lips:                  Previously seen        Bladder:                Previously seen  Thoracic:  Previously seen        Spine:                  Previously seen  Heart:                 Previously seen        Upper Extremities:      Previously seen  RVOT:                  Previously seen        Lower Extremities:      Previously seen  LVOT:                  Previously seen  Other:  Female gender previously visualized. Heels, Nasal bone, Right open          hand, and IVC/SVC prev. visualized. Technically difficult due to fetal          position. ---------------------------------------------------------------------- Doppler - Fetal Vessels  Umbilical Artery   S/D     %tile      RI    %tile      PI    %tile            ADFV    RDFV   3.06       76    0.67       78    1.05       80               No      No ---------------------------------------------------------------------- Cervix Uterus Adnexa  Cervix  Not visualized (advanced GA >24wks)  Uterus  No abnormality visualized.  Cul De Sac  No free fluid seen.  Adnexa  No abnormality visualized. ---------------------------------------------------------------------- Comments  Cheron Every was seen for a biophysical profile due to  chronic  hypertension treated with labetalol and pregestational  diabetes treated with insulin.  She was diagnosed with  preeclampsia few weeks ago when her P/C ratio indicated  new onset proteinuria.  IUGR has also been noted in her  current pregnancy.  Her last growth scan two weeks ago  showed an EFW of 3 pounds 3 ounces (9th percentile for her  gestational age).  The patient reports that she has experienced a headache and  upper abdominal pain for the past 3 days.  Her blood  pressures in our office today were 163/92 and 162/95.  A biophysical profile performed today was 8 out of 8.  She  also had a nonreactive nonstress test making her total  biophysical profile score 8 out of 10.  There was normal  amniotic fluid noted today.  Doppler studies of the umbilical arteries performed today  showed a normal S/D ratio of 3.06.  There were no signs of  absent or reversed diastolic flow.  Due to her elevated blood pressures and symptoms along  with the fact that she has been diagnosed with preeclampsia,  the patient was sent to the hospital for admission to receive a  complete course of antenatal corticosteroids and  observation/delivery.  Due to preeclampsia with fetal growth restriction, I would  recommend inpatient management until delivery.  She may  continue the antihypertensive medications (labetalol) for  blood pressure control.  As she is complaining of a headache,I would recommend that  she be continued on magnesium sulfate for maternal seizure  prophylaxis until her antenatal corticosteroid course is  completed.  The goal for her delivery would be 34 weeks.  Delivery prior to 34 weeks would be recommended if her  blood pressures remain persistently greater than 160/100  despite medication treatment, should she require IV push  antihypertensive medication for blood pressure control,  should she complain of a persistent severe headache, should  her liver function tests increase or platelet counts decrease,  or should  there be nonreassuring fetal status.  All conversations were held with the patient today with the  help of an Arabic interpreter.  Recommendations:  Inpatient management until delivery  Continue magnesium sulfate for maternal seizure prophylaxis  until her antenatal corticosteroid course is completed  Continue labetalol for blood pressure control  Delivery at 34 weeks  Delivery prior to 34 weeks would be indicated:           Should her blood pressures be persistently greater  than 160/100 despite treatment           Should she require IV push medication for blood  pressure control           Should she complain of any signs and symptoms of  severe preeclampsia           Should her [place] labs be abnormal           At any time for nonreassuring fetal status  A total of 30 minutes was spent counseling and coordinating  the care for this patient. ----------------------------------------------------------------------                   Johnell Comings, MD Electronically Signed Final Report   06/23/2021 05:49 pm ----------------------------------------------------------------------  [place] MFM UA CORD DOPPLER  Result Date: 06/16/2021 ----------------------------------------------------------------------  OBSTETRICS REPORT                       (Signed Final 06/16/2021 09:19 am) ---------------------------------------------------------------------- Patient Info  ID #:       [id number]                          D.O.B.:  10-May-1986 (34 yrs)  Name:       Lind Covert                Visit Date: 06/16/2021 07:33 am              [place] ---------------------------------------------------------------------- Performed By  Attending:        Tama High MD        Secondary Phy.:   Graciela Husbands CNM  Performed By:     Lelan Pons RDMS       Address:          [address]  [place], [place]  Referred By:      [facility] MAU/Triage         [facility]         [facility]                                                             [facility] at                                                             [place] [facility]                                                             Women ---------------------------------------------------------------------- Orders  #  Description                           Code        Ordered By  1  [place] MFM UA CORD DOPPLER                76820.02    RAVI SHANKAR  2  [place] MFM FETAL BPP                      46568.1     RAVI [facility]     W/NONSTRESS ----------------------------------------------------------------------  #  Order #                     Accession #                Episode #  1  [id number]                   [id number]                 [id number]  2  [id number]                   [id number]                 [id number] ---------------------------------------------------------------------- Indications  [redacted] weeks gestation of pregnancy                Z3A.32  Pre-existing diabetes, type 1, in pregnancy,   O24.013  third trimester  Maternal care for known or suspected poor      O36.5930  fetal growth, third trimester, not applicable or  unspecified IUGR  Insufficient Prenatal Care (transferred from   O6.30  [place])  Hypertension - Chronic with superimposed  O11.9 O10.919  preeclampsia (Labetalol)  Advanced maternal age multigravida 43+,        O49.523  third trimester ---------------------------------------------------------------------- Fetal Evaluation  Num Of Fetuses:         1  Fetal Heart Rate(bpm):  138  Cardiac Activity:       Observed  Presentation:           Breech  Placenta:               Anterior  P. Cord Insertion:      Previously Visualized  Amniotic Fluid  AFI FV:      Within normal limits  AFI Sum(cm)     %Tile       Largest Pocket(cm)  22.91           89           7.47  RUQ(cm)       RLQ(cm)       LUQ(cm)        LLQ(cm)  5.07          5             7.47           5.37 ---------------------------------------------------------------------- Biophysical Evaluation  Amniotic F.V:   Within normal limits       F. Tone:        Observed  F. Movement:    Observed                   N.S.T:          Reactive  F. Breathing:   Observed                   Score:          10/10 ---------------------------------------------------------------------- Biometry  LV:          5  mm ---------------------------------------------------------------------- OB History  Gravidity:    2         Term:   0        Prem:   0        SAB:   1  TOP:          0       Ectopic:  0        Living: 0 ---------------------------------------------------------------------- Gestational Age  LMP:           33w 6d        Date:  10/22/20                 EDD:   07/29/21  Best:          Milderd Meager 0d     Det. By:  U/S  (04/11/21)          EDD:   08/11/21 ---------------------------------------------------------------------- Anatomy  Ventricles:            Appears normal         Kidneys:                Appear normal  Heart:                 Appears normal         Bladder:                Appears normal                         (4CH, axis, and  situs)  Stomach:               Appears normal, left                         sided ---------------------------------------------------------------------- Doppler - Fetal Vessels  Umbilical Artery   S/D     %tile      RI    %tile      PI    %tile            ADFV    RDFV   2.48       38     0.6       45    0.91       52               No      No ---------------------------------------------------------------------- Cervix Uterus Adnexa  Cervix  Not visualized (advanced GA >24wks)  Right Ovary  Not visualized.  Left Ovary  Not visualized. ---------------------------------------------------------------------- Impression  Fetal growth restriction.  On ultrasound performed last week,   the estimated fetal weight was at the 9th percentile.  Patient has type 1 diabetes and takes insulin for control.  Fetal echocardiography performed earlier was normal.  She has chronic hypertension and takes labetalol.  Patient  did not take her morning dose yet.  Blood pressure is today at  her office was 160/88 mmHg and repeat 146/84 mmHg.  She  reports she has been having intermittent headaches.  No  Amniotic fluid is normal good fetal activity seen.  Antenatal  testing is reassuring.  Cephalic presentation.  Umbilical artery  Doppler showed normal forward diastolic flow.  NST is  reactive.  BPP 10/10.  I counseled the patient with help of language interpreter  present in the room.  I encouraged her to go to the MAU if  she has severe headaches or visual disturbances or right  upper quadrant pain. ---------------------------------------------------------------------- Recommendations  -Continue weekly BPP and UA Doppler till delivery. ----------------------------------------------------------------------                  Tama High, MD Electronically Signed Final Report   06/16/2021 09:19 am ----------------------------------------------------------------------  [place] MFM UA CORD DOPPLER  Result Date: 06/09/2021 ----------------------------------------------------------------------  OBSTETRICS REPORT                       (Signed Final 06/09/2021 11:01 am) ---------------------------------------------------------------------- Patient Info  ID #:       [id number]                          D.O.B.:  02-May-1986 (34 yrs)  Name:       Lind Covert                Visit Date: 06/09/2021 09:15 am              [place] ---------------------------------------------------------------------- Performed By  Attending:        Tama High MD        Secondary Phy.:   Graciela Husbands CNM  Performed  By:     Germain Osgood            Address:          [address]                                                             [place], [place]  Referred By:      [facility] MAU/Triage         [facility]         [facility]                                                             Fetal [facility] at                                                             [place] for                                                             Women ---------------------------------------------------------------------- Orders  #  Description                           Code        Ordered By  1  [place] MFM OB FOLLOW UP                   76816.01    RAVI SHANKAR  2  [place] MFM UA CORD DOPPLER                18563.14    RAVI SHANKAR ----------------------------------------------------------------------  #  Order #                     Accession #                Episode #  1  [id number]                   [id number]                 [id number]  2  [id number]                   [id number]                 [id number] ---------------------------------------------------------------------- Indications  Pre-existing diabetes, type 1, in pregnancy,   O24.012  second trimester  Maternal care for known or suspected poor      O36.5920  fetal growth, second trimester, not applicable  or unspecified IUGR  Insufficient Prenatal Care (transferred from   O09.30  [place])  [redacted] weeks gestation of pregnancy                Z3A.31  Hypertension - Chronic with superimposed       O11.9 O10.919  preeclampsia (Labetalol) ---------------------------------------------------------------------- Fetal Evaluation  Num Of Fetuses:         1  Fetal Heart Rate(bpm):  143  Cardiac Activity:       Observed  Presentation:           Cephalic  Placenta:               Anterior  P. Cord Insertion:      Visualized, central  Amniotic Fluid  AFI FV:      Within normal limits  AFI Sum(cm)     %Tile       Largest  Pocket(cm)  21.32           83          6.19  RUQ(cm)       RLQ(cm)       LUQ(cm)        LLQ(cm)  6.19          4.7           5.38           5.05 ---------------------------------------------------------------------- Biometry  BPD:      77.6  mm     G. Age:  31w 1d         43  %    CI:        79.96   %    70 - 86                                                          FL/HC:      20.1   %    19.3 - 21.3  HC:      274.2  mm     G. Age:  30w 0d        3.1  %    HC/AC:      1.06        0.96 - 1.17  AC:      257.8  mm     G. Age:  30w 0d         18  %    FL/BPD:     71.1   %    71 - 87  FL:       55.2  mm     G. Age:  29w 1d  3.6  %    FL/AC:      21.4   %    20 - 24  HUM:      51.2  mm     G. Age:  30w 0d         30  %  LV:        3.9  mm  Est. FW:    1450  gm      3 lb 3 oz      9  % ---------------------------------------------------------------------- OB History  Gravidity:    2         Term:   0        Prem:   0        SAB:   1  TOP:          0       Ectopic:  0        Living: 0 ---------------------------------------------------------------------- Gestational Age  LMP:           32w 6d        Date:  10/22/20                 EDD:   07/29/21  U/S Today:     30w 1d                                        EDD:   08/17/21  Best:          31w 0d     Det. By:  U/S  (04/11/21)          EDD:   08/11/21 ---------------------------------------------------------------------- Anatomy  Cranium:               Appears normal         Aortic Arch:            Previously seen  Cavum:                 Appears normal         Ductal Arch:            Previously seen  Ventricles:            Appears normal         Diaphragm:              Appears normal  Choroid Plexus:        Previously seen        Stomach:                Appears normal, left                                                                        sided  Cerebellum:            Previously seen        Abdomen:                Previously seen  Posterior Fossa:        Previously seen        Abdominal Wall:  Previously seen  Nuchal Fold:           Not applicable (>26    Cord Vessels:           Previously seen                         wks GA)  Face:                  Orbits and profile     Kidneys:                Appear normal                         previously seen  Lips:                  Previously seen        Bladder:                Appears normal  Thoracic:              Previously seen        Spine:                  Previously seen  Heart:                 Appears normal         Upper Extremities:      Previously seen                         (4CH, axis, and                         situs)  RVOT:                  Previously seen        Lower Extremities:      Previously seen  LVOT:                  Previously seen  Other:  Female gender previously visualized. Heels previously visualized.          Nasal bone previously visualized. Right open hand visualized.          IVC/SVC prev visualized. Technically difficult due to fetal position. ---------------------------------------------------------------------- Doppler - Fetal Vessels  Umbilical Artery   S/D     %tile      RI    %tile      PI    %tile            ADFV    RDFV   2.55       37    0.61       40    0.89       41               No      No ---------------------------------------------------------------------- Cervix Uterus Adnexa  Cervix  Normal appearance by transabdominal scan.  Uterus  No abnormality visualized. ---------------------------------------------------------------------- Impression  Patient returned for fetal growth assessment.  She has type 1 diabetes.  She takes Novolin 12 units in the  morning and 12 units at night and NovoLog 5 units with each  meals.  She reports her fasting levels are between 95 and  100 mg/DL.  Fetal echocardiography performed earlier was reported as  normal.  Chronic hypertension.  Well-controlled on labetalol.  Blood  pressure today at her office is 137/89 mmHg.  Her pregnancy is  dated by first ultrasound performed at our  office at [redacted] weeks gestation.  On today's ultrasound, the estimated fetal weight is at the 9th  percentile.  Abdominal circumference measurement is at the  18th percentile.  Amniotic fluid is normal and good fetal  activity seen.  Cephalic presentation.  Placenta is anterior.  There is no free-floating umbilical cord below the presenting  part (fetal head).  Umbilical artery Doppler showed normal forward diastolic flow  I explained the finding of fetal growth restriction that is difficult  to differentiate from a constitutionally small fetus.  I explained  our ultrasound protocol of weekly fetal monitoring.  I encouraged the patient to bring her blood glucose log at her  ultrasound visits.  I advised her to increase bedtime Novolin  to 14 units. ---------------------------------------------------------------------- Recommendations  -Appointments were made for weekly BPP, UA Doppler and  NST.  -Fetal growth in 3 weeks.  -Patient to bring her blood glucose log at her ultrasound visits. ----------------------------------------------------------------------                  Tama High, MD Electronically Signed Final Report   06/09/2021 11:01 am ----------------------------------------------------------------------   MAU Course  Procedures  MDM -severe 10/10 back pain in setting of recent epidural, forceps delivery and 3rd degree tear -Tylenol 1040m and Flexeril 114mgiven, pt initially reports pain now 3/10, but then approximately 2 hours later reports pain has returned to 6/10 -306moradol IM given -CBC: H/H 10.6/31.6, platelets 328 -CMP: serum creatinine 0.38, AST/ALT 11/13 -consult with anesthesiology who comes to bedside for assessment (see separate note) and agrees with plan to proceed with MRI -MRI: 1. Small epidural collection involving the left dorsolateral epidural space extending from T11-12 through L2-3, likely reflecting a small epidural hematoma  related to recent spinal epidural injection. Associated mild enhancement favored to be reactive, although superimposed infection could be considered in the correct clinical setting. No significant spinal stenosis or evidence for cord impingement. -called and left message neurosurgery at 930PM -BP: single severe range pressure, patient has taken Procardia this morning, 9PM dose given in MAU, per Dr. EurElonda HuskyK [place] maintain dose of BP medication as previously ordered as attribute elevated BP to pain -MeJinny BlossomP from neurosurgery called after reviewing images with neurosurgeon, and Dr. PooTrenton Gammonnted to make sure she was not on Lovenox, and requests to hold any additional NSAIDs at this time. Per MegJinny Blossomatient should be placed on IV Decadron, and MegJinny Blossoms alerted to patients Type I DM status. Megan and Dr. PooTrenton Gammonll enter orders related to findings on MRI. They do not feel like it is an infection, but an epidural hematoma. They will plan to round in the morning and recommend admission at this time. Patient to be admitted for epidural hematoma and decadron will calm down inflammation and help with irritated nerves, as the blood in that area is very irritating. -spoke with Dr. MelLyndel Pleasureill admit under hospitalist service, but may take a long time to get a bed assigned  Orders Placed This Encounter  Procedures   MR Lumbar Spine W Wo Contrast    Standing Status:   Standing    Number of Occurrences:   1    Order Specific Question:   If indicated for the ordered procedure, I authorize the administration of contrast media per Radiology protocol    Answer:   Yes  Order Specific Question:   What is the patient's sedation requirement?    Answer:   No Sedation    Order Specific Question:   Does the patient have a pacemaker or implanted devices?    Answer:   No   MR THORACIC SPINE W WO CONTRAST    Standing Status:   Standing    Number of Occurrences:   1    Order Specific Question:   If indicated for the  ordered procedure, I authorize the administration of contrast media per Radiology protocol    Answer:   Yes    Order Specific Question:   What is the patient's sedation requirement?    Answer:   No Sedation    Order Specific Question:   Does the patient have a pacemaker or implanted devices?    Answer:   No   CBC with Differential/Platelet    Standing Status:   Standing    Number of Occurrences:   1   Comprehensive metabolic panel    Standing Status:   Standing    Number of Occurrences:   1   Insert peripheral IV    Standing Status:   Standing    Number of Occurrences:   1   Meds ordered this encounter  Medications   acetaminophen (TYLENOL) tablet 1,000 mg   cyclobenzaprine (FLEXERIL) tablet 10 mg   ketorolac (TORADOL) 30 MG/ML injection 30 mg   gadobutrol (GADAVIST) 1 MMOL/ML injection 5 mL   NIFEdipine (PROCARDIA-XL/NIFEDICAL-XL) 24 hr tablet 60 mg   dexamethasone (DECADRON) injection 4 mg   Assessment and Plan   1. Epidural hematoma (Faith)   2. Postpartum state    -admit under hospitalist service  Gerrie Nordmann Janisa Labus 07/04/2021, 10:34 PM

## 2021-07-04 NOTE — MAU Note (Signed)
Patient Delivered 06/29/21 and reports no problems at delivery besides delivering early, infant currently in NICU.  She had an epidural for delivery and has back pain "on the inside" towards the lower back.  Epidural site appears WNL. Patient reports Headache that started 07/03/21.  Ibuprofen was taken and resolved Headache but back pain continues.

## 2021-07-04 NOTE — Progress Notes (Signed)
MRI department called and informed of patient ready for tests.  Initially scans were on hold per Nugent NP due to pain decreasing with pain medication however, with recent pain scale of 5/10 POC to move forward with scans.

## 2021-07-05 ENCOUNTER — Other Ambulatory Visit: Payer: Self-pay

## 2021-07-05 DIAGNOSIS — Z794 Long term (current) use of insulin: Secondary | ICD-10-CM | POA: Diagnosis not present

## 2021-07-05 DIAGNOSIS — Z8249 Family history of ischemic heart disease and other diseases of the circulatory system: Secondary | ICD-10-CM | POA: Diagnosis not present

## 2021-07-05 DIAGNOSIS — G9519 Other vascular myelopathies: Secondary | ICD-10-CM | POA: Diagnosis present

## 2021-07-05 DIAGNOSIS — O9089 Other complications of the puerperium, not elsewhere classified: Secondary | ICD-10-CM | POA: Diagnosis present

## 2021-07-05 DIAGNOSIS — Z79899 Other long term (current) drug therapy: Secondary | ICD-10-CM | POA: Diagnosis not present

## 2021-07-05 DIAGNOSIS — E1010 Type 1 diabetes mellitus with ketoacidosis without coma: Secondary | ICD-10-CM | POA: Diagnosis present

## 2021-07-05 DIAGNOSIS — R32 Unspecified urinary incontinence: Secondary | ICD-10-CM | POA: Diagnosis present

## 2021-07-05 DIAGNOSIS — S064X9A Epidural hemorrhage with loss of consciousness of unspecified duration, initial encounter: Secondary | ICD-10-CM | POA: Diagnosis not present

## 2021-07-05 DIAGNOSIS — Z91014 Allergy to mammalian meats: Secondary | ICD-10-CM | POA: Diagnosis not present

## 2021-07-05 DIAGNOSIS — O2403 Pre-existing diabetes mellitus, type 1, in the puerperium: Secondary | ICD-10-CM | POA: Diagnosis present

## 2021-07-05 LAB — GLUCOSE, CAPILLARY
Glucose-Capillary: 148 mg/dL — ABNORMAL HIGH (ref 70–99)
Glucose-Capillary: 181 mg/dL — ABNORMAL HIGH (ref 70–99)
Glucose-Capillary: 183 mg/dL — ABNORMAL HIGH (ref 70–99)
Glucose-Capillary: 343 mg/dL — ABNORMAL HIGH (ref 70–99)
Glucose-Capillary: 96 mg/dL (ref 70–99)

## 2021-07-05 LAB — FIBRINOGEN: Fibrinogen: 705 mg/dL — ABNORMAL HIGH (ref 210–475)

## 2021-07-05 LAB — VITAMIN B12: Vitamin B-12: 941 pg/mL — ABNORMAL HIGH (ref 180–914)

## 2021-07-05 LAB — BASIC METABOLIC PANEL
Anion gap: 7 (ref 5–15)
BUN: 14 mg/dL (ref 6–20)
CO2: 21 mmol/L — ABNORMAL LOW (ref 22–32)
Calcium: 8.8 mg/dL — ABNORMAL LOW (ref 8.9–10.3)
Chloride: 105 mmol/L (ref 98–111)
Creatinine, Ser: 0.45 mg/dL (ref 0.44–1.00)
GFR, Estimated: >60 mL/min (ref >60)
Glucose, Bld: 267 mg/dL — ABNORMAL HIGH (ref 70–99)
Potassium: 4.4 mmol/L (ref 3.5–5.1)
Sodium: 133 mmol/L — ABNORMAL LOW (ref 135–145)

## 2021-07-05 LAB — CBC
HCT: 34.3 % — ABNORMAL LOW (ref 36.0–46.0)
Hemoglobin: 10.9 g/dL — ABNORMAL LOW (ref 12.0–15.0)
MCH: 28.9 pg (ref 26.0–34.0)
MCHC: 31.8 g/dL (ref 30.0–36.0)
MCV: 91 fL (ref 80.0–100.0)
Platelets: 308 10*3/uL (ref 150–400)
RBC: 3.77 MIL/uL — ABNORMAL LOW (ref 3.87–5.11)
RDW: 13.2 % (ref 11.5–15.5)
WBC: 7.9 10*3/uL (ref 4.0–10.5)
nRBC: 0 % (ref 0.0–0.2)

## 2021-07-05 LAB — PROTIME-INR
INR: 1.1 (ref 0.8–1.2)
Prothrombin Time: 13.9 seconds (ref 11.4–15.2)

## 2021-07-05 LAB — IRON AND TIBC
Iron: 42 ug/dL (ref 28–170)
Saturation Ratios: 10 % — ABNORMAL LOW (ref 10.4–31.8)
TIBC: 430 ug/dL (ref 250–450)
UIBC: 388 ug/dL

## 2021-07-05 LAB — APTT: aPTT: 30 seconds (ref 24–36)

## 2021-07-05 MED ORDER — HYDROCODONE-ACETAMINOPHEN 5-325 MG PO TABS: 1.0000 | ORAL_TABLET | Freq: Four times a day (QID) | ORAL | Status: DC | PRN

## 2021-07-05 NOTE — Consult Note (Signed)
Providing Compassionate, Quality Care - Together   Reason for Consult: Epidural hematoma Referring Physician: Sharyon Medicus ibrahim Kristin Clay is an 35 y.o. female.  HPI: Patient is Arabic-speaking. A translator was utilized during this assessment. Patient is a 35 year old female with a PMH significant for type 1 diabetes, hypertension, preeclampsia, vaginal delivery, female circumcision is presenting with worsening back pain since delivery 5 days ago. She received an epidural prior to her delivery on 06/29/2021. Thoracic/lumbar MRI showed a small epidural collection involving the left dorsal lateral aspect of the thecal sac, extending from the T11-12 through L2-3 levels, likely reflecting a small epidural hematoma related to recent spinal epidural injection. Patient denies numbness, tingling, or weakness of extremities. She describes a pressure type of pain in her back at the site of her epidural injection. She denies fever. Neurosurgery was consulted for further evaluation and recommendations.  Past Medical History:  Diagnosis Date   Chronic hypertension    Complication of anesthesia    with hand surgery, local anes did not work, tried twice- had to put her to sleep   Diabetes mellitus type 1 (HCC)    DKA (diabetic ketoacidosis) (HCC) 10/2020   Infection    UTI   Pyelonephritis 10/2020   Type I diabetes mellitus (HCC)    dx 91yrs ago    Past Surgical History:  Procedure Laterality Date   female circumcision Bilateral    clitorectomy as well   FOOT SURGERY  2018   HAND SURGERY  2019    Family History  Problem Relation Age of Onset   Hyperlipidemia Mother    Hypertension Mother    Kidney disease Father    Alzheimer's disease Father     Social History:  reports that she has never smoked. She has never used smokeless tobacco. She reports that she does not drink alcohol and does not use drugs.  Allergies:  Allergies  Allergen Reactions   Pork-Derived Products     Pt does  not want any pork products    Medications: I have reviewed the patient's current medications.  Results for orders placed or performed during the hospital encounter of 07/04/21 (from the past 48 hour(s))  CBC with Differential/Platelet     Status: Abnormal   Collection Time: 07/04/21  5:04 PM  Result Value Ref Range   WBC 5.1 4.0 - 10.5 K/uL   RBC 3.59 (L) 3.87 - 5.11 MIL/uL   Hemoglobin 10.6 (L) 12.0 - 15.0 g/dL   HCT 16.1 (L) 09.6 - 04.5 %   MCV 88.0 80.0 - 100.0 fL   MCH 29.5 26.0 - 34.0 pg   MCHC 33.5 30.0 - 36.0 g/dL   RDW 40.9 81.1 - 91.4 %   Platelets 328 150 - 400 K/uL   nRBC 0.0 0.0 - 0.2 %   Neutrophils Relative % 59 %   Neutro Abs 3.1 1.7 - 7.7 K/uL   Lymphocytes Relative 28 %   Lymphs Abs 1.4 0.7 - 4.0 K/uL   Monocytes Relative 8 %   Monocytes Absolute 0.4 0.1 - 1.0 K/uL   Eosinophils Relative 3 %   Eosinophils Absolute 0.1 0.0 - 0.5 K/uL   Basophils Relative 1 %   Basophils Absolute 0.0 0.0 - 0.1 K/uL   Immature Granulocytes 1 %   Abs Immature Granulocytes 0.03 0.00 - 0.07 K/uL    Comment: Performed at [facility], [address], [place], [place] [address]  Comprehensive metabolic panel  Status: Abnormal   Collection Time: 07/04/21  5:04 PM  Result Value Ref Range   Sodium 137 135 - 145 mmol/L   Potassium 3.7 3.5 - 5.1 mmol/L   Chloride 108 98 - 111 mmol/L   CO2 22 22 - 32 mmol/L   Glucose, Bld 105 (H) 70 - 99 mg/dL    Comment: Glucose reference range applies only to samples taken after fasting for at least 8 hours.   BUN 12 6 - 20 mg/dL   Creatinine, Ser 9.82 (L) 0.44 - 1.00 mg/dL   Calcium 8.5 (L) 8.9 - 10.3 mg/dL   Total Protein 6.7 6.5 - 8.1 g/dL   Albumin 2.5 (L) 3.5 - 5.0 g/dL   AST 11 (L) 15 - 41 U/L   ALT 13 0 - 44 U/L   Alkaline Phosphatase 87 38 - 126 U/L   Total Bilirubin 0.8 0.3 - 1.2 mg/dL   GFR, Estimated >64 >15 mL/min    Comment: (NOTE) Calculated using the CKD-EPI Creatinine Equation (2021)    Anion gap 7 5 - 15     Comment: Performed at [facility], [address], [place], [place] [address]  Glucose, capillary     Status: None   Collection Time: 07/04/21 11:59 PM  Result Value Ref Range   Glucose-Capillary 96 70 - 99 mg/dL    Comment: Glucose reference range applies only to samples taken after fasting for at least 8 hours.  Basic metabolic panel     Status: Abnormal   Collection Time: 07/05/21  2:02 AM  Result Value Ref Range   Sodium 133 (L) 135 - 145 mmol/L   Potassium 4.4 3.5 - 5.1 mmol/L   Chloride 105 98 - 111 mmol/L   CO2 21 (L) 22 - 32 mmol/L   Glucose, Bld 267 (H) 70 - 99 mg/dL    Comment: Glucose reference range applies only to samples taken after fasting for at least 8 hours.   BUN 14 6 - 20 mg/dL   Creatinine, Ser 0.76 0.44 - 1.00 mg/dL   Calcium 8.8 (L) 8.9 - 10.3 mg/dL   GFR, Estimated >80 >88 mL/min    Comment: (NOTE) Calculated using the CKD-EPI Creatinine Equation (2021)    Anion gap 7 5 - 15    Comment: Performed at [facility], [address], [place], [place] [address]  CBC     Status: Abnormal   Collection Time: 07/05/21  2:02 AM  Result Value Ref Range   WBC 7.9 4.0 - 10.5 K/uL   RBC 3.77 (L) 3.87 - 5.11 MIL/uL   Hemoglobin 10.9 (L) 12.0 - 15.0 g/dL   HCT 59.4 (L) 58.5 - 92.9 %   MCV 91.0 80.0 - 100.0 fL   MCH 28.9 26.0 - 34.0 pg   MCHC 31.8 30.0 - 36.0 g/dL   RDW 24.4 62.8 - 63.8 %   Platelets 308 150 - 400 K/uL   nRBC 0.0 0.0 - 0.2 %    Comment: Performed at [facility], [address], [place], [place] [address]  Iron and TIBC     Status: Abnormal   Collection Time: 07/05/21  8:59 AM  Result Value Ref Range   Iron 42 28 - 170 ug/dL   TIBC 657 903 - 833 ug/dL   Saturation Ratios 10 (L) 10.4 - 31.8 %   UIBC 388 ug/dL    Comment: Performed at [facility], [address] [place], [place] [address]  Vitamin B12     Status: Abnormal   Collection Time: 07/05/21  8:59 AM  Result Value Ref Range   Vitamin B-12 941 (H) 180 - 914 pg/mL     Comment: (NOTE) This assay is not validated for testing neonatal or myeloproliferative syndrome specimens for Vitamin B12 levels. Performed at [facility], [address] [place], [place] [address]   Glucose, capillary     Status: Abnormal   Collection Time: 07/05/21  9:09 AM  Result Value Ref Range   Glucose-Capillary 181 (H) 70 - 99 mg/dL    Comment: Glucose reference range applies only to samples taken after fasting for at least 8 hours.  Fibrinogen     Status: Abnormal   Collection Time: 07/05/21 10:12 AM  Result Value Ref Range   Fibrinogen 705 (H) 210 - 475 mg/dL    Comment: (NOTE) Fibrinogen results may be underestimated in patients receiving thrombolytic therapy. Performed at [facility], [address], [place], [place] [address]   Protime-INR     Status: None   Collection Time: 07/05/21 10:12 AM  Result Value Ref Range   Prothrombin Time 13.9 11.4 - 15.2 seconds   INR 1.1 0.8 - 1.2    Comment: (NOTE) INR goal varies based on device and disease states. Performed at [facility], [address] [place], [place] [address]   APTT     Status: None   Collection Time: 07/05/21 10:12 AM  Result Value Ref Range   aPTT 30 24 - 36 seconds    Comment: Performed at [facility], [address], [place], [place] [address]    MR THORACIC SPINE W WO CONTRAST  Result Date: 07/04/2021 CLINICAL DATA:  Initial evaluation for severe lower back pain status post recent epidural injection. EXAM: MRI THORACIC AND LUMBAR SPINE WITHOUT AND WITH CONTRAST TECHNIQUE: Multiplanar and multiecho pulse sequences of the thoracic and lumbar spine were obtained without and with intravenous contrast. CONTRAST:  47mL GADAVIST GADOBUTROL 1 MMOL/ML IV SOLN COMPARISON:  None available. FINDINGS: MRI THORACIC SPINE FINDINGS Alignment: Physiologic with preservation of the normal thoracic kyphosis. No listhesis. Vertebrae: Vertebral body height maintained without acute or chronic  fracture. Bone marrow signal intensity normal. No discrete or worrisome osseous lesions. No abnormal marrow edema or enhancement. No evidence for osteomyelitis discitis or septic arthritis. Cord: There is a small epidural collection involving the left dorsal lateral aspect of the thecal sac, extending from the T11-12 through L2-3 levels, likely reflecting a small epidural hematoma related to recent spinal epidural injection (series 22, image 39). Collection measures no more than 3 mm in AP diameter at its greatest dimension. Secondary flattening of the left posterior thecal sac without significant spinal stenosis or evidence for cord impingement. Associated mild enhancement. Otherwise, no other epidural collection seen elsewhere within the thoracic spine. Cord itself is normal in caliber and appearance without abnormal enhancement. Paraspinal and other soft tissues: Unremarkable. Disc levels: No significant disc pathology seen within the thoracic spine. No disc bulge or focal disc herniation. No other stenosis or impingement. MRI LUMBAR SPINE FINDINGS Segmentation: Standard. Lowest well-formed disc space labeled the L5-S1 level. Alignment: Physiologic with preservation of the normal lumbar lordosis. No listhesis. Vertebrae: Vertebral body height maintained without acute or chronic fracture. Bone marrow signal intensity within normal limits. No discrete or worrisome osseous lesions. No evidence for osteomyelitis discitis or septic arthritis. Conus medullaris: Extends to the L1 level. Above described epidural collection involving the  left dorsal lateral epidural space extends to the level of L2-3. Mild flattening and mass effect on the adjacent thecal sac without significant spinal stenosis. This measures no more than 3 mm in AP diameter at its greatest point in the lumbar spine (series 5, image 7). Associated mild enhancement. Otherwise, conus medullaris and nerve roots of the cauda equina are normal in appearance.  Paraspinal and other soft tissues: Minimal stranding and enhancement seen within the posterior paraspinous soft tissues near the level of L3, likely related to recent epidural injection. No discrete soft tissue collections. Paraspinous soft tissues otherwise unremarkable. Disc levels: No significant disc pathology seen within the lumbar spine. No disc bulge or focal disc herniation. No significant stenosis or impingement. IMPRESSION: 1. Small epidural collection involving the left dorsolateral epidural space extending from T11-12 through L2-3, likely reflecting a small epidural hematoma related to recent spinal epidural injection. Associated mild enhancement favored to be reactive, although superimposed infection could be considered in the correct clinical setting. No significant spinal stenosis or evidence for cord impingement. 2. Otherwise normal MRI of the thoracic and lumbar spine. Electronically Signed   By: Rise MuBenjamin  McClintock M.D.   On: 07/04/2021 21:15   MR Lumbar Spine W Wo Contrast  Result Date: 07/04/2021 CLINICAL DATA:  Initial evaluation for severe lower back pain status post recent epidural injection. EXAM: MRI THORACIC AND LUMBAR SPINE WITHOUT AND WITH CONTRAST TECHNIQUE: Multiplanar and multiecho pulse sequences of the thoracic and lumbar spine were obtained without and with intravenous contrast. CONTRAST:  5mL GADAVIST GADOBUTROL 1 MMOL/ML IV SOLN COMPARISON:  None available. FINDINGS: MRI THORACIC SPINE FINDINGS Alignment: Physiologic with preservation of the normal thoracic kyphosis. No listhesis. Vertebrae: Vertebral body height maintained without acute or chronic fracture. Bone marrow signal intensity normal. No discrete or worrisome osseous lesions. No abnormal marrow edema or enhancement. No evidence for osteomyelitis discitis or septic arthritis. Cord: There is a small epidural collection involving the left dorsal lateral aspect of the thecal sac, extending from the T11-12 through L2-3  levels, likely reflecting a small epidural hematoma related to recent spinal epidural injection (series 22, image 39). Collection measures no more than 3 mm in AP diameter at its greatest dimension. Secondary flattening of the left posterior thecal sac without significant spinal stenosis or evidence for cord impingement. Associated mild enhancement. Otherwise, no other epidural collection seen elsewhere within the thoracic spine. Cord itself is normal in caliber and appearance without abnormal enhancement. Paraspinal and other soft tissues: Unremarkable. Disc levels: No significant disc pathology seen within the thoracic spine. No disc bulge or focal disc herniation. No other stenosis or impingement. MRI LUMBAR SPINE FINDINGS Segmentation: Standard. Lowest well-formed disc space labeled the L5-S1 level. Alignment: Physiologic with preservation of the normal lumbar lordosis. No listhesis. Vertebrae: Vertebral body height maintained without acute or chronic fracture. Bone marrow signal intensity within normal limits. No discrete or worrisome osseous lesions. No evidence for osteomyelitis discitis or septic arthritis. Conus medullaris: Extends to the L1 level. Above described epidural collection involving the left dorsal lateral epidural space extends to the level of L2-3. Mild flattening and mass effect on the adjacent thecal sac without significant spinal stenosis. This measures no more than 3 mm in AP diameter at its greatest point in the lumbar spine (series 5, image 7). Associated mild enhancement. Otherwise, conus medullaris and nerve roots of the cauda equina are normal in appearance. Paraspinal and other soft tissues: Minimal stranding and enhancement seen within the posterior paraspinous soft tissues near  the level of L3, likely related to recent epidural injection. No discrete soft tissue collections. Paraspinous soft tissues otherwise unremarkable. Disc levels: No significant disc pathology seen within the  lumbar spine. No disc bulge or focal disc herniation. No significant stenosis or impingement. IMPRESSION: 1. Small epidural collection involving the left dorsolateral epidural space extending from T11-12 through L2-3, likely reflecting a small epidural hematoma related to recent spinal epidural injection. Associated mild enhancement favored to be reactive, although superimposed infection could be considered in the correct clinical setting. No significant spinal stenosis or evidence for cord impingement. 2. Otherwise normal MRI of the thoracic and lumbar spine. Electronically Signed   By: Rise Mu M.D.   On: 07/04/2021 21:15    Review of Systems  Constitutional:  Negative for chills and fever.  HENT: Negative.    Eyes: Negative.   Respiratory: Negative.    Cardiovascular: Negative.   Gastrointestinal: Negative.   Genitourinary: Negative.   Musculoskeletal:  Positive for back pain. Negative for falls, joint pain, myalgias and neck pain.  Skin: Negative.   Neurological: Negative.   Endo/Heme/Allergies: Negative.   Psychiatric/Behavioral: Negative.     Blood pressure 134/87, pulse 88, temperature 98.3 F (36.8 C), temperature source Oral, resp. rate 18, height 5' 1.02" (1.55 m), weight 50.4 kg, SpO2 100 %, currently breastfeeding. Estimated body mass index is 20.98 kg/m as calculated from the following:   Height as of this encounter: 5' 1.02" (1.55 m).   Weight as of this encounter: 50.4 kg.  Physical Exam Constitutional:      Appearance: Normal appearance.  HENT:     Head: Normocephalic and atraumatic.     Mouth/Throat:     Mouth: Mucous membranes are moist.     Pharynx: Oropharynx is clear.  Eyes:     Extraocular Movements: Extraocular movements intact.     Pupils: Pupils are equal, round, and reactive to light.  Cardiovascular:     Rate and Rhythm: Normal rate and regular rhythm.  Pulmonary:     Effort: Pulmonary effort is normal.  Abdominal:     General: Abdomen is  flat.     Palpations: Abdomen is soft.  Musculoskeletal:        General: Tenderness present.     Comments: TTP at epidural site  Skin:    General: Skin is warm and dry.     Capillary Refill: Capillary refill takes less than 2 seconds.  Neurological:     General: No focal deficit present.     Mental Status: She is alert and oriented to person, place, and time. Mental status is at baseline.  Psychiatric:        Mood and Affect: Mood normal.        Behavior: Behavior normal.    Assessment/Plan: Patient with a small epidural hematoma at the site of her epidural injection. No surgical intervention recommended. Continue with efforts at pain control. Continue IV decadron; recommend Medrol Dosepak at discharge. Mobilize with therapies.  Val Eagle, DNP, AGNP-C Nurse Practitioner  [facility] [address], [address], [place], [place] [address] P: [phone]    F: [phone]  07/05/2021, 11:46 AM

## 2021-07-05 NOTE — Plan of Care (Signed)

## 2021-07-05 NOTE — Progress Notes (Signed)
[facility] Progress Note  Patient: Kristin Clay    TDD:[id number]  DOA: 07/04/2021     Date of Service: the patient was seen and examined on 07/05/2021  Brief hospital course: PMH of chronic hypertension, preeclampsia requiring preterm delivery, type I DM.  Presents with complaints of worsening back pain found to have small epidural hematoma. Neurosurgery consulted.  Started on Decadron. Currently plan is pain control.  Subjective: Reports 5 out of 10 pain currently and worsening up to 8 out of 10.  Intermittent in nature.  Sharp.  No radiculopathy symptoms.  No nausea no vomiting.  No fever no chills.  No chest pain.  No bleeding anywhere.  Pain does not radiate anywhere.  Assessment and Plan: 1.  Mild epidural hematoma with back pain Recent epidural anesthesia for vaginal delivery Appreciate neurosurgery consultation. Started on IV Decadron. Recommend to continue it in the hospital and discharged home.. Pain control is difficult with current regimen. Discussed with OB okay to use narcotics. Avoid tramadol but okay to use morphine, oxycodone or Percocet. Patient also okay to avoid breast-feeding while she is on pain medication.  2.  Type 1 diabetes mellitus, uncontrolled with hyperglycemia with long-term insulin use without any complication Blood sugar currently mildly elevated. Concern is while the patient is on steroids she may end up with severe hyperglycemia. Continue currently regimen for now but only sliding scale insulin. Hemoglobin A1c 8.8 in June.  3.  Hypertension Blood pressure currently stable. On Procardia.  4.  Recent preeclampsia Postpartum 5 days vaginal delivery Currently no evidence of preeclampsia ongoing right now. Will monitor. [facility].  Scheduled Meds:  dexamethasone (DECADRON) injection  4 mg Intravenous Q6H   ferrous sulfate  325 mg Oral Q breakfast   insulin aspart  0-15 Units  Subcutaneous TID WC   insulin aspart  0-5 Units Subcutaneous QHS   insulin aspart protamine- aspart  6 Units Subcutaneous BID WC   NIFEdipine  60 mg Oral BID   prenatal multivitamin  1 tablet Oral Daily   sodium chloride flush  3 mL Intravenous Q12H   Continuous Infusions: PRN Meds: acetaminophen **OR** acetaminophen, cyclobenzaprine, HYDROcodone-acetaminophen  Body mass index is 20.98 kg/m.        DVT Prophylaxis:   SCDs Start: 07/04/21 2241    Advance goals of care discussion: Pt is Full code.  Family Communication: no family was present at bedside, at the time of interview.   Data Reviewed: I have personally reviewed and interpreted daily labs, tele strips, imaging. CBG relatively controlled.  Hemoglobin stable.  Physical Exam:  General: Appear in mild distress, no Rash; Oral Mucosa Clear, moist. no Abnormal Neck Mass Or lumps, Conjunctiva normal  Cardiovascular: S1 and S2 Present, no Murmur, Respiratory: good respiratory effort, Bilateral Air entry present and CTA, no Crackles, no wheezes Abdomen: Bowel Sound present, Soft and no tenderness Extremities: no Pedal edema Neurology: alert and oriented to time, place, and person affect appropriate. no new focal deficit Gait not checked due to patient safety concerns  Vitals:   07/04/21 2148 07/05/21 0037 07/05/21 0147 07/05/21 0912  BP: 139/84 (!) 149/77  134/87  Pulse: 73 86  88  Resp:  18  18  Temp:  98.4 F (36.9 C)  98.3 F (36.8 C)  TempSrc:    Oral  SpO2:    100%  Weight:   50.4 kg   Height:   5' 1.02" (1.55 m)  Disposition:  Status is: Inpatient  Remains inpatient appropriate because:Ongoing active pain requiring inpatient pain management  Dispo: The patient is from: Home              Anticipated d/c is to: Home              Patient currently is not medically stable to d/c.   Difficult to place patient No        Time spent: 35 minutes. I reviewed all nursing notes, pharmacy notes, vitals,  pertinent old records. I have discussed plan of care as described above with RN.  Author: Lynden Oxford, MD [facility] 07/05/2021 6:52 PM  To reach On-call, see care teams to locate the attending and reach out via [email or web address]. Between 7PM-7AM, please contact night-coverage If you still have difficulty reaching the attending provider, please page the [facility] (Director on Call) for [facility] on amion for assistance.

## 2021-07-06 LAB — GLUCOSE, CAPILLARY
Glucose-Capillary: 363 mg/dL — ABNORMAL HIGH (ref 70–99)
Glucose-Capillary: 148 mg/dL — ABNORMAL HIGH (ref 70–99)

## 2021-07-06 MED ORDER — DOCUSATE SODIUM 100 MG PO CAPS: 100.0000 mg | ORAL_CAPSULE | Freq: Two times a day (BID) | ORAL | 0 refills | Status: DC | PRN

## 2021-07-06 MED ORDER — DEXAMETHASONE SODIUM PHOSPHATE 4 MG/ML IJ SOLN
4.0000 mg | Freq: Four times a day (QID) | INTRAMUSCULAR | Status: DC
  Filled 2021-07-06 (×2): qty 1

## 2021-07-06 MED ORDER — DEXAMETHASONE SODIUM PHOSPHATE 4 MG/ML IJ SOLN
4.0000 mg | Freq: Four times a day (QID) | INTRAMUSCULAR | Status: DC
Start: 2021-07-06 — End: 2021-07-06
  Filled 2021-07-06 (×2): qty 1

## 2021-07-06 MED ORDER — METHYLPREDNISOLONE 4 MG PO TBPK: ORAL_TABLET | ORAL | 0 refills | Status: DC

## 2021-07-06 NOTE — Progress Notes (Signed)
Providing Compassionate, Quality Care - Together   Subjective: Patient reports she feels much better this morning. She tells me she has ambulated and her pain is well-controlled. Nurse reports elevated blood glucose.  Objective: Vital signs in last 24 hours: Temp:  [97.8 F (36.6 C)-98.7 F (37.1 C)] 97.8 F (36.6 C) (08/14 0811) Pulse Rate:  [81-99] 81 (08/14 0811) Resp:  [18] 18 (08/14 0811) BP: (120-127)/(78-95) 127/95 (08/14 0811) SpO2:  [100 %] 100 % (08/14 0811) Weight:  [53.3 kg] 53.3 kg (08/14 0510)  Intake/Output from previous day: 08/13 0701 - 08/14 0700 In: 360 [P.O.:360] Out: -  Intake/Output this shift: No intake/output data recorded.  Alert and oriented x 4 PERRLA CN II-XII grossly intact MAE, Strength and sensation intact Mild tenderness at epidural site  Lab Results: Recent Labs    07/04/21 1704 07/05/21 0202  WBC 5.1 7.9  HGB 10.6* 10.9*  HCT 31.6* 34.3*  PLT 328 308   BMET Recent Labs    07/04/21 1704 07/05/21 0202  NA 137 133*  K 3.7 4.4  CL 108 105  CO2 22 21*  GLUCOSE 105* 267*  BUN 12 14  CREATININE 0.38* 0.45  CALCIUM 8.5* 8.8*    Studies/Results: MR THORACIC SPINE W WO CONTRAST  Result Date: 07/04/2021 CLINICAL DATA:  Initial evaluation for severe lower back pain status post recent epidural injection. EXAM: MRI THORACIC AND LUMBAR SPINE WITHOUT AND WITH CONTRAST TECHNIQUE: Multiplanar and multiecho pulse sequences of the thoracic and lumbar spine were obtained without and with intravenous contrast. CONTRAST:  24mL GADAVIST GADOBUTROL 1 MMOL/ML IV SOLN COMPARISON:  None available. FINDINGS: MRI THORACIC SPINE FINDINGS Alignment: Physiologic with preservation of the normal thoracic kyphosis. No listhesis. Vertebrae: Vertebral body height maintained without acute or chronic fracture. Bone marrow signal intensity normal. No discrete or worrisome osseous lesions. No abnormal marrow edema or enhancement. No evidence for osteomyelitis  discitis or septic arthritis. Cord: There is a small epidural collection involving the left dorsal lateral aspect of the thecal sac, extending from the T11-12 through L2-3 levels, likely reflecting a small epidural hematoma related to recent spinal epidural injection (series 22, image 39). Collection measures no more than 3 mm in AP diameter at its greatest dimension. Secondary flattening of the left posterior thecal sac without significant spinal stenosis or evidence for cord impingement. Associated mild enhancement. Otherwise, no other epidural collection seen elsewhere within the thoracic spine. Cord itself is normal in caliber and appearance without abnormal enhancement. Paraspinal and other soft tissues: Unremarkable. Disc levels: No significant disc pathology seen within the thoracic spine. No disc bulge or focal disc herniation. No other stenosis or impingement. MRI LUMBAR SPINE FINDINGS Segmentation: Standard. Lowest well-formed disc space labeled the L5-S1 level. Alignment: Physiologic with preservation of the normal lumbar lordosis. No listhesis. Vertebrae: Vertebral body height maintained without acute or chronic fracture. Bone marrow signal intensity within normal limits. No discrete or worrisome osseous lesions. No evidence for osteomyelitis discitis or septic arthritis. Conus medullaris: Extends to the L1 level. Above described epidural collection involving the left dorsal lateral epidural space extends to the level of L2-3. Mild flattening and mass effect on the adjacent thecal sac without significant spinal stenosis. This measures no more than 3 mm in AP diameter at its greatest point in the lumbar spine (series 5, image 7). Associated mild enhancement. Otherwise, conus medullaris and nerve roots of the cauda equina are normal in appearance. Paraspinal and other soft tissues: Minimal stranding and enhancement seen within the  posterior paraspinous soft tissues near the level of L3, likely related to  recent epidural injection. No discrete soft tissue collections. Paraspinous soft tissues otherwise unremarkable. Disc levels: No significant disc pathology seen within the lumbar spine. No disc bulge or focal disc herniation. No significant stenosis or impingement. IMPRESSION: 1. Small epidural collection involving the left dorsolateral epidural space extending from T11-12 through L2-3, likely reflecting a small epidural hematoma related to recent spinal epidural injection. Associated mild enhancement favored to be reactive, although superimposed infection could be considered in the correct clinical setting. No significant spinal stenosis or evidence for cord impingement. 2. Otherwise normal MRI of the thoracic and lumbar spine. Electronically Signed   By: Rise Mu M.D.   On: 07/04/2021 21:15   MR Lumbar Spine W Wo Contrast  Result Date: 07/04/2021 CLINICAL DATA:  Initial evaluation for severe lower back pain status post recent epidural injection. EXAM: MRI THORACIC AND LUMBAR SPINE WITHOUT AND WITH CONTRAST TECHNIQUE: Multiplanar and multiecho pulse sequences of the thoracic and lumbar spine were obtained without and with intravenous contrast. CONTRAST:  31mL GADAVIST GADOBUTROL 1 MMOL/ML IV SOLN COMPARISON:  None available. FINDINGS: MRI THORACIC SPINE FINDINGS Alignment: Physiologic with preservation of the normal thoracic kyphosis. No listhesis. Vertebrae: Vertebral body height maintained without acute or chronic fracture. Bone marrow signal intensity normal. No discrete or worrisome osseous lesions. No abnormal marrow edema or enhancement. No evidence for osteomyelitis discitis or septic arthritis. Cord: There is a small epidural collection involving the left dorsal lateral aspect of the thecal sac, extending from the T11-12 through L2-3 levels, likely reflecting a small epidural hematoma related to recent spinal epidural injection (series 22, image 39). Collection measures no more than 3 mm in  AP diameter at its greatest dimension. Secondary flattening of the left posterior thecal sac without significant spinal stenosis or evidence for cord impingement. Associated mild enhancement. Otherwise, no other epidural collection seen elsewhere within the thoracic spine. Cord itself is normal in caliber and appearance without abnormal enhancement. Paraspinal and other soft tissues: Unremarkable. Disc levels: No significant disc pathology seen within the thoracic spine. No disc bulge or focal disc herniation. No other stenosis or impingement. MRI LUMBAR SPINE FINDINGS Segmentation: Standard. Lowest well-formed disc space labeled the L5-S1 level. Alignment: Physiologic with preservation of the normal lumbar lordosis. No listhesis. Vertebrae: Vertebral body height maintained without acute or chronic fracture. Bone marrow signal intensity within normal limits. No discrete or worrisome osseous lesions. No evidence for osteomyelitis discitis or septic arthritis. Conus medullaris: Extends to the L1 level. Above described epidural collection involving the left dorsal lateral epidural space extends to the level of L2-3. Mild flattening and mass effect on the adjacent thecal sac without significant spinal stenosis. This measures no more than 3 mm in AP diameter at its greatest point in the lumbar spine (series 5, image 7). Associated mild enhancement. Otherwise, conus medullaris and nerve roots of the cauda equina are normal in appearance. Paraspinal and other soft tissues: Minimal stranding and enhancement seen within the posterior paraspinous soft tissues near the level of L3, likely related to recent epidural injection. No discrete soft tissue collections. Paraspinous soft tissues otherwise unremarkable. Disc levels: No significant disc pathology seen within the lumbar spine. No disc bulge or focal disc herniation. No significant stenosis or impingement. IMPRESSION: 1. Small epidural collection involving the left  dorsolateral epidural space extending from T11-12 through L2-3, likely reflecting a small epidural hematoma related to recent spinal epidural injection. Associated mild enhancement favored  to be reactive, although superimposed infection could be considered in the correct clinical setting. No significant spinal stenosis or evidence for cord impingement. 2. Otherwise normal MRI of the thoracic and lumbar spine. Electronically Signed   By: Rise Mu M.D.   On: 07/04/2021 21:15    Assessment/Plan: Patient with a small epidural hematoma at the site of her epidural injection. No surgical intervention recommended. Continue with efforts at pain control. Recommend Medrol Dosepak at discharge with insulin plan in place to manage elevated blood glucose levels on steroids. Mobilize with therapies. Patient is fine to discharge home from a Neurosurgical perspective. Recommend patient follow up with Dr. Jordan Likes as an outpatient to monitor progress.    LOS: 1 day     Val Eagle, DNP, AGNP-C Nurse Practitioner  [facility] [address], [address], [place], [place] [address] P: [phone]    F: [phone]  07/06/2021, 10:08 AM

## 2021-07-06 NOTE — Progress Notes (Signed)
Nursing Discharge Note  Patient and husband given dc instructions. Both verbalized understanding. Belonging given to patient. Patient denies any pain or discomfort. CCmd made aware patient going home.Patient and spouse will be going home after they visit the baby in the NICU.

## 2021-07-06 NOTE — Evaluation (Signed)
Physical Therapy Evaluation Patient Details Name: Kristin Clay MRN: [id number] DOB: 1986-09-20 Today's Date: 07/06/2021   History of Present Illness  Pt is a 35 y/o female admitted secondary to back pain and found to have an epidural hematoma from a recent epidural (vaginal birth). PMH including but not limited to type 1 diabetes, hypertension and recent vaginal birth (5 days prior to admission).   Clinical Impression  Pt presented sitting upright at EOB, awake and willing to participate in therapy session. Prior to admission, pt reported that she was independent with all functional mobility and ADLs. Pt lives with her spouse in a single level home with a level entry. At the time of evaluation, pt with no reports of pain and able to perform all functional mobility at a supervision to independent level. PT will continue to follow pt acutely to progress mobility as tolerated and to ensure a safe d/c home.    Follow Up Recommendations No PT follow up    Equipment Recommendations  None recommended by PT    Recommendations for Other Services       Precautions / Restrictions Precautions Precautions: None Restrictions Weight Bearing Restrictions: No      Mobility  Bed Mobility               General bed mobility comments: pt sitting upright at EOB upon PT arrival    Transfers Overall transfer level: Independent                  Ambulation/Gait Ambulation/Gait assistance: Independent;Supervision Gait Distance (Feet): 30 Feet Assistive device: None Gait Pattern/deviations: Step-through pattern     General Gait Details: no LOB or deficits noted  Stairs            Wheelchair Mobility    Modified Rankin (Stroke Patients Only)       Balance Overall balance assessment: Needs assistance Sitting-balance support: Feet supported Sitting balance-Leahy Scale: Normal     Standing balance support: No upper extremity supported Standing balance-Leahy  Scale: Good                               Pertinent Vitals/Pain Pain Assessment: No/denies pain    Home Living Family/patient expects to be discharged to:: Private residence Living Arrangements: Spouse/significant other Available Help at Discharge: Family   Home Access: Level entry     Home Layout: One level Home Equipment: None      Prior Function Level of Independence: Independent               Hand Dominance        Extremity/Trunk Assessment   Upper Extremity Assessment Upper Extremity Assessment: Overall WFL for tasks assessed    Lower Extremity Assessment Lower Extremity Assessment: Overall WFL for tasks assessed       Communication   Communication: Prefers language other than [place];Interpreter utilized;Other (comment) (Interpreter: Vickey Huger [phone])  Cognition Arousal/Alertness: Awake/alert Behavior During Therapy: WFL for tasks assessed/performed Overall Cognitive Status: Difficult to assess                                 General Comments: WFL for general conversation      General Comments      Exercises     Assessment/Plan    PT Assessment Patient needs continued PT services  PT Problem List Decreased mobility;Decreased knowledge of precautions;Pain  PT Treatment Interventions DME instruction;Gait training;Stair training;Functional mobility training;Therapeutic activities;Therapeutic exercise;Balance training;Neuromuscular re-education;Patient/family education    PT Goals (Current goals can be found in the Care Plan section)  Acute Rehab PT Goals Patient Stated Goal: really wants her mother to come help her with the new baby after d/c PT Goal Formulation: With patient Time For Goal Achievement: 07/20/21 Potential to Achieve Goals: Good    Frequency Min 3X/week   Barriers to discharge        Co-evaluation               AM-PAC PT "6 Clicks" Mobility  Outcome Measure Help needed turning from  your back to your side while in a flat bed without using bedrails?: None Help needed moving from lying on your back to sitting on the side of a flat bed without using bedrails?: None Help needed moving to and from a bed to a chair (including a wheelchair)?: None Help needed standing up from a chair using your arms (e.g., wheelchair or bedside chair)?: None Help needed to walk in hospital room?: None Help needed climbing 3-5 steps with a railing? : A Little 6 Click Score: 23    End of Session   Activity Tolerance: Patient tolerated treatment well Patient left: in bed;with call bell/phone within reach Nurse Communication: Mobility status PT Visit Diagnosis: Pain Pain - part of body:  (back)    Time: 6295-2841 PT Time Calculation (min) (ACUTE ONLY): 11 min   Charges:   PT Evaluation $PT Eval Low Complexity: 1 Low          Ginette Pitman, PT, DPT  Acute Rehabilitation Services Office [phone]   Kristin Clay 07/06/2021, 12:49 PM

## 2021-07-07 ENCOUNTER — Ambulatory Visit: Payer: 59

## 2021-07-07 ENCOUNTER — Encounter: Payer: Self-pay | Admitting: Family Medicine

## 2021-07-07 LAB — GLUCOSE, CAPILLARY
Glucose-Capillary: 145 mg/dL — ABNORMAL HIGH (ref 70–99)
Glucose-Capillary: 138 mg/dL — ABNORMAL HIGH (ref 70–99)
Glucose-Capillary: 146 mg/dL — ABNORMAL HIGH (ref 70–99)
Glucose-Capillary: 127 mg/dL — ABNORMAL HIGH (ref 70–99)
Glucose-Capillary: 107 mg/dL — ABNORMAL HIGH (ref 70–99)

## 2021-07-08 ENCOUNTER — Ambulatory Visit: Payer: Self-pay

## 2021-07-08 NOTE — Discharge Summary (Signed)
[facility] Discharge Summary   Patient: Kristin Clay OXB:[id number]  PCP: Pcp, No  Date of admission: 07/04/2021   Date of discharge: 07/06/2021     Discharge Diagnoses:  Principal Problem:   Epidural hematoma ([place]) Active Problems:   Type 1 diabetes mellitus with ketoacidosis, uncontrolled ([place])   History of diabetic ketoacidosis   Chronic hypertension with superimposed severe preeclampsia   Vaginal delivery  Admitted From: Home Disposition:  Home   Recommendations for Outpatient Follow-up:  PCP: Follow-up with PCP in 1 week.   Follow-up Information     Earnie Larsson, MD. Schedule an appointment as soon as possible for a visit in 2 week(s).   Specialty: Neurosurgery Contact information: [address] [place] [address] [phone]                Discharge Instructions     Diet - low sodium heart healthy   Complete by: As directed    Increase activity slowly   Complete by: As directed        Diet recommendation: Regular diet  Activity: The patient is advised to gradually reintroduce usual activities, as tolerated  Discharge Condition: stable  Code Status: Full code   History of present illness: As per the H and P dictated on admission, "Kristin Clay is a 36 y.o. female with medical history significant of type 1 diabetes, hypertension, preeclampsia, vaginal delivery, female circumcision is presenting with worsening back pain since delivery 5 days ago.             History obtained with assistance of Fish farm manager. Patient had a vaginal delivery 5 days ago.  Child was born preterm and is currently in the NICU at [facility].  Her back pain has been persistent since that time and worsening.  She was able to tolerate until 2 days ago when it began to become debilitating.  Pain is located at the site of the epidural and is a constant pain with no radiation.  No numbness or weakness in her legs no saddle anesthesia.   Has had some urinary incontinence but she is just 5 days out from her vaginal delivery. No over-the-counter medicines helping with pain. Toradol received in the ED did help with the pain. Denies fevers, chills, chest pain, shortness of breath, abdominal pain, constipation, diarrhea, nausea, vomiting.    MAU course: Vital signs in the ED MAU significant for blood pressure in the 211H 417 systolic most recently in the 130s.  Lab work-up showed CMP with glucose 105, calcium 8.5, albumin 2.5.  CBC with hemoglobin stable at 10.6.  MRI showed a small epidural collection involving the dorsal lateral epidural space from around T11-L3.  Likely hematoma.  There is some associated mild enhancement which is favored to be reactive.  Anesthesia consulted initially in the MAU who recommended the imaging and neurosurgery consult.  Neurosurgery reviewed imaging and recommend Decadron and monitoring and they will see the patient in the morning.  Patient received dose of Flexeril, Toradol, nifedipine in ED MAU.  Decadron has been ordered."  Hospital Course:  Summary of her active problems in the hospital is as following. 1.  Mild epidural hematoma with back pain Recent epidural anesthesia for vaginal delivery Appreciate neurosurgery consultation. Started on IV Decadron. Recommend to continue it in the hospital and discharged home.. Pain control is difficult with current regimen. Discussed with OB okay to use narcotics. Avoid tramadol but okay to use morphine, oxycodone or Percocet. Patient also  okay to avoid breast-feeding while she is on pain medication.   2.  Type 1 diabetes mellitus, uncontrolled with hyperglycemia with long-term insulin use without any complication Blood sugar currently mildly elevated. Concern is while the patient is on steroids she may end up with severe hyperglycemia. Continue currently regimen for now but only sliding scale insulin. Hemoglobin A1c 8.8 in June.   3.  Hypertension Blood  pressure currently stable. On Procardia.   4.  Recent preeclampsia Postpartum 5 days vaginal delivery Currently no evidence of preeclampsia ongoing right now. Will monitor. [facility].  Patient was seen by physical therapy, who recommended No therapy needed on discharge,. On the day of the discharge the patient's vitals were stable, and no other new acute medical condition were reported. The patient was felt safe to be discharge at Home with no therapy needed on discharge.  Consultants: OB/GYN, neurosurgery Procedures: none  DISCHARGE MEDICATION: Allergies as of 07/06/2021       Reactions   Pork-derived Products    Pt does not want any pork products        Medication List     TAKE these medications    blood glucose meter kit and supplies Dispense based on patient and insurance preference. Use up to four times daily as directed. (FOR ICD-10 E10.9, E11.9).   cyclobenzaprine 5 MG tablet Commonly known as: FLEXERIL Take 1 tablet (5 mg total) by mouth at bedtime as needed for muscle spasms.   docusate sodium 100 MG capsule Commonly known as: Colace Take 1 capsule (100 mg total) by mouth 2 (two) times daily as needed for mild constipation.   FeroSul 325 (65 FE) MG tablet Generic drug: ferrous sulfate Take 1 tablet (325 mg total) by mouth daily with breakfast.   glucose blood test strip Use as instructed QID   HumuLIN 70/30 KwikPen (70-30) 100 UNIT/ML KwikPen Generic drug: insulin isophane & regular human KwikPen Inject 8 Units into the skin 2 (two) times daily.   methylPREDNISolone 4 MG Tbpk tablet Commonly known as: MEDROL DOSEPAK Take as directed.   NIFEdipine 60 MG 24 hr tablet Commonly known as: ADALAT CC Take 1 tablet (60 mg total) by mouth 2 (two) times daily.   OneTouch Delica Lancets 56C Misc 1 Device by Does not apply route in the morning, at noon, in the evening, and at bedtime.   Pentips 32G X 4 MM  Misc Generic drug: Insulin Pen Needle Use as directed   Prenatal Vitamin 27-0.8 MG Tabs Take 1 tablet by mouth daily.       ASK your doctor about these medications    ibuprofen 600 MG tablet Commonly known as: ADVIL Take 1 tablet (600 mg total) by mouth every 6 (six) hours.        Discharge Exam: Filed Weights   07/05/21 0147 07/06/21 0510  Weight: 50.4 kg 53.3 kg   Vitals:   07/05/21 1900 07/06/21 0510  BP: 126/78 120/80  Pulse: 96 99  Resp: 18 18  Temp: 98.3 F (36.8 C) 98.7 F (37.1 C)  SpO2: 100% 100%   General: Appear in mild distress, no Rash; Oral Mucosa Clear, moist. no Abnormal Neck Mass Or lumps, Conjunctiva normal  Cardiovascular: S1 and S2 Present, no Murmur, Respiratory: good respiratory effort, Bilateral Air entry present and CTA, no Crackles, no wheezes Abdomen: Bowel Sound present, Soft and no tenderness Extremities: no Pedal edema Neurology: alert and oriented to time, place, and person affect appropriate.  no new focal deficit Gait not checked  The results of significant diagnostics from this hospitalization (including imaging, microbiology, ancillary and laboratory) are listed below for reference.    Significant Diagnostic Studies: MR THORACIC SPINE W WO CONTRAST  Result Date: 07/04/2021 CLINICAL DATA:  Initial evaluation for severe lower back pain status post recent epidural injection. EXAM: MRI THORACIC AND LUMBAR SPINE WITHOUT AND WITH CONTRAST TECHNIQUE: Multiplanar and multiecho pulse sequences of the thoracic and lumbar spine were obtained without and with intravenous contrast. CONTRAST:  51m GADAVIST GADOBUTROL 1 MMOL/ML IV SOLN COMPARISON:  None available. FINDINGS: MRI THORACIC SPINE FINDINGS Alignment: Physiologic with preservation of the normal thoracic kyphosis. No listhesis. Vertebrae: Vertebral body height maintained without acute or chronic fracture. Bone marrow signal intensity normal. No discrete or worrisome osseous lesions. No  abnormal marrow edema or enhancement. No evidence for osteomyelitis discitis or septic arthritis. Cord: There is a small epidural collection involving the left dorsal lateral aspect of the thecal sac, extending from the T11-12 through L2-3 levels, likely reflecting a small epidural hematoma related to recent spinal epidural injection (series 22, image 39). Collection measures no more than 3 mm in AP diameter at its greatest dimension. Secondary flattening of the left posterior thecal sac without significant spinal stenosis or evidence for cord impingement. Associated mild enhancement. Otherwise, no other epidural collection seen elsewhere within the thoracic spine. Cord itself is normal in caliber and appearance without abnormal enhancement. Paraspinal and other soft tissues: Unremarkable. Disc levels: No significant disc pathology seen within the thoracic spine. No disc bulge or focal disc herniation. No other stenosis or impingement. MRI LUMBAR SPINE FINDINGS Segmentation: Standard. Lowest well-formed disc space labeled the L5-S1 level. Alignment: Physiologic with preservation of the normal lumbar lordosis. No listhesis. Vertebrae: Vertebral body height maintained without acute or chronic fracture. Bone marrow signal intensity within normal limits. No discrete or worrisome osseous lesions. No evidence for osteomyelitis discitis or septic arthritis. Conus medullaris: Extends to the L1 level. Above described epidural collection involving the left dorsal lateral epidural space extends to the level of L2-3. Mild flattening and mass effect on the adjacent thecal sac without significant spinal stenosis. This measures no more than 3 mm in AP diameter at its greatest point in the lumbar spine (series 5, image 7). Associated mild enhancement. Otherwise, conus medullaris and nerve roots of the cauda equina are normal in appearance. Paraspinal and other soft tissues: Minimal stranding and enhancement seen within the  posterior paraspinous soft tissues near the level of L3, likely related to recent epidural injection. No discrete soft tissue collections. Paraspinous soft tissues otherwise unremarkable. Disc levels: No significant disc pathology seen within the lumbar spine. No disc bulge or focal disc herniation. No significant stenosis or impingement. IMPRESSION: 1. Small epidural collection involving the left dorsolateral epidural space extending from T11-12 through L2-3, likely reflecting a small epidural hematoma related to recent spinal epidural injection. Associated mild enhancement favored to be reactive, although superimposed infection could be considered in the correct clinical setting. No significant spinal stenosis or evidence for cord impingement. 2. Otherwise normal MRI of the thoracic and lumbar spine. Electronically Signed   By: BJeannine BogaM.D.   On: 07/04/2021 21:15   MR Lumbar Spine W Wo Contrast  Result Date: 07/04/2021 CLINICAL DATA:  Initial evaluation for severe lower back pain status post recent epidural injection. EXAM: MRI THORACIC AND LUMBAR SPINE WITHOUT AND WITH CONTRAST TECHNIQUE: Multiplanar and multiecho pulse sequences of the thoracic and lumbar spine were  obtained without and with intravenous contrast. CONTRAST:  73m GADAVIST GADOBUTROL 1 MMOL/ML IV SOLN COMPARISON:  None available. FINDINGS: MRI THORACIC SPINE FINDINGS Alignment: Physiologic with preservation of the normal thoracic kyphosis. No listhesis. Vertebrae: Vertebral body height maintained without acute or chronic fracture. Bone marrow signal intensity normal. No discrete or worrisome osseous lesions. No abnormal marrow edema or enhancement. No evidence for osteomyelitis discitis or septic arthritis. Cord: There is a small epidural collection involving the left dorsal lateral aspect of the thecal sac, extending from the T11-12 through L2-3 levels, likely reflecting a small epidural hematoma related to recent spinal epidural  injection (series 22, image 39). Collection measures no more than 3 mm in AP diameter at its greatest dimension. Secondary flattening of the left posterior thecal sac without significant spinal stenosis or evidence for cord impingement. Associated mild enhancement. Otherwise, no other epidural collection seen elsewhere within the thoracic spine. Cord itself is normal in caliber and appearance without abnormal enhancement. Paraspinal and other soft tissues: Unremarkable. Disc levels: No significant disc pathology seen within the thoracic spine. No disc bulge or focal disc herniation. No other stenosis or impingement. MRI LUMBAR SPINE FINDINGS Segmentation: Standard. Lowest well-formed disc space labeled the L5-S1 level. Alignment: Physiologic with preservation of the normal lumbar lordosis. No listhesis. Vertebrae: Vertebral body height maintained without acute or chronic fracture. Bone marrow signal intensity within normal limits. No discrete or worrisome osseous lesions. No evidence for osteomyelitis discitis or septic arthritis. Conus medullaris: Extends to the L1 level. Above described epidural collection involving the left dorsal lateral epidural space extends to the level of L2-3. Mild flattening and mass effect on the adjacent thecal sac without significant spinal stenosis. This measures no more than 3 mm in AP diameter at its greatest point in the lumbar spine (series 5, image 7). Associated mild enhancement. Otherwise, conus medullaris and nerve roots of the cauda equina are normal in appearance. Paraspinal and other soft tissues: Minimal stranding and enhancement seen within the posterior paraspinous soft tissues near the level of L3, likely related to recent epidural injection. No discrete soft tissue collections. Paraspinous soft tissues otherwise unremarkable. Disc levels: No significant disc pathology seen within the lumbar spine. No disc bulge or focal disc herniation. No significant stenosis or  impingement. IMPRESSION: 1. Small epidural collection involving the left dorsolateral epidural space extending from T11-12 through L2-3, likely reflecting a small epidural hematoma related to recent spinal epidural injection. Associated mild enhancement favored to be reactive, although superimposed infection could be considered in the correct clinical setting. No significant spinal stenosis or evidence for cord impingement. 2. Otherwise normal MRI of the thoracic and lumbar spine. Electronically Signed   By: BJeannine BogaM.D.   On: 07/04/2021 21:15   [place] FETAL BPP W/NONSTRESS  Result Date: 06/23/2021 ----------------------------------------------------------------------  OBSTETRICS REPORT                       (Signed Final 06/23/2021 05:49 pm) ---------------------------------------------------------------------- Patient Info  ID #:       [id number]                         D.O.B.:  [birth year](35 yrs)  Name:       FLind Covert               Visit Date: 06/23/2021 07:39 am              [place] ----------------------------------------------------------------------  Performed By  Attending:        Johnell Comings MD         Secondary Phy.:   Graciela Husbands CNM  Performed By:     Jacob Moores BS,       Address:          [address] [place], [place]  Referred By:      [place]          Location[facility]         [facility]                                [facility] at                                                             [facility]                                                             [facility]  Ref. Address:     [address]                    [place], [place] ---------------------------------------------------------------------- Orders  #  Description  Code        Ordered By  1  [place] MFM UA CORD DOPPLER                [id number]    RAVI SHANKAR  2  [place] MFM FETAL BPP                      80165.5     RAVI [facility]     W/NONSTRESS ----------------------------------------------------------------------  #  Order #                     Accession #                Episode #  1  [id number]                   [id number]                 [id number]  2  [id number]                   [id number]                 [id number] ---------------------------------------------------------------------- Indications  Maternal care for known or suspected poor      O36.5930  fetal growth, third trimester, not applicable or  unspecified IUGR  Advanced maternal age multigravida 18+,        O24.523  third trimester  Hypertension - Chronic with superimposed       O11.9 O10.919  preeclampsia (Labetalol)  Pre-existing diabetes, type 1, in pregnancy,   O24.013  third trimester  [redacted] weeks gestation of pregnancy                Z3A.33  Insufficient Prenatal Care (transferred from   O86.30  [place]) ---------------------------------------------------------------------- Fetal Evaluation  Num Of Fetuses:         1  Fetal Heart Rate(bpm):  131  Cardiac Activity:       Observed  Presentation:           Breech  Placenta:               Anterior  P. Cord Insertion:      Previously Visualized  Amniotic Fluid  AFI FV:      Within normal limits  AFI Sum(cm)     %Tile       Largest Pocket(cm)  19.8            74          7.3  RUQ(cm)       RLQ(cm)       LUQ(cm)        LLQ(cm)  2.1           6.4           7.3            4 ---------------------------------------------------------------------- Biophysical Evaluation  Amniotic F.V:   Pocket => 2 cm             F. Tone:        Observed  F. Movement:    Observed                   N.S.T:          Nonreactive  F. Breathing:   Observed                   Score:  8/10 ---------------------------------------------------------------------- OB History  Gravidity:    2         Term:   0        Prem:   0        SAB:   1  TOP:          0       Ectopic:  0        Living: 0 ---------------------------------------------------------------------- Gestational Age  LMP:           34w 6d        Date:  10/22/20                 EDD:   07/29/21  Best:          33w 0d     Det. By:  U/S  (04/11/21)          EDD:   08/11/21 ---------------------------------------------------------------------- Anatomy  Cranium:               Previously seen        Aortic Arch:            Previously seen  Cavum:                 Previously seen        Ductal Arch:            Previously seen  Ventricles:            Previously seen        Diaphragm:              Previously seen  Choroid Plexus:        Previously seen        Stomach:                Previously Seen  Cerebellum:            Previously seen        Abdomen:                Previously seen  Posterior Fossa:       Previously seen        Abdominal Wall:         Previously seen  Nuchal Fold:           Not applicable (>62    Cord Vessels:           Previously seen                         wks GA)  Face:                  Orbits and profile     Kidneys:                Previously seen                         previously seen  Lips:                  Previously seen        Bladder:                Previously seen  Thoracic:              Previously seen        Spine:  Previously seen  Heart:                 Previously seen        Upper Extremities:      Previously seen  RVOT:                  Previously seen        Lower Extremities:      Previously seen  LVOT:                  Previously seen  Other:  Female gender previously visualized. Heels, Nasal bone, Right open          hand, and IVC/SVC prev. visualized. Technically difficult due to fetal          position. ---------------------------------------------------------------------- Doppler - Fetal  Vessels  Umbilical Artery   S/D     %tile      RI    %tile      PI    %tile            ADFV    RDFV   3.06       76    0.67       78    1.05       80               No      No ---------------------------------------------------------------------- Cervix Uterus Adnexa  Cervix  Not visualized (advanced GA >24wks)  Uterus  No abnormality visualized.  Cul De Sac  No free fluid seen.  Adnexa  No abnormality visualized. ---------------------------------------------------------------------- Comments  Cheron Every was seen for a biophysical profile due to  chronic hypertension treated with labetalol and pregestational  diabetes treated with insulin.  She was diagnosed with  preeclampsia few weeks ago when her P/C ratio indicated  new onset proteinuria.  IUGR has also been noted in her  current pregnancy.  Her last growth scan two weeks ago  showed an EFW of 3 pounds 3 ounces (9th percentile for her  gestational age).  The patient reports that she has experienced a headache and  upper abdominal pain for the past 3 days.  Her blood  pressures in our office today were 163/92 and 162/95.  A biophysical profile performed today was 8 out of 8.  She  also had a nonreactive nonstress test making her total  biophysical profile score 8 out of 10.  There was normal  amniotic fluid noted today.  Doppler studies of the umbilical arteries performed today  showed a normal S/D ratio of 3.06.  There were no signs of  absent or reversed diastolic flow.  Due to her elevated blood pressures and symptoms along  with the fact that she has been diagnosed with preeclampsia,  the patient was sent to the hospital for admission to receive a  complete course of antenatal corticosteroids and  observation/delivery.  Due to preeclampsia with fetal growth restriction, I would  recommend inpatient management until delivery.  She may  continue the antihypertensive medications (labetalol) for  blood pressure control.  As she is complaining of a headache,I  would recommend that  she be continued on magnesium sulfate for maternal seizure  prophylaxis until her antenatal corticosteroid course is  completed.  The goal for her delivery would be 34 weeks.  Delivery prior to 34 weeks would be recommended if her  blood pressures remain persistently greater than 160/100  despite medication treatment, should she require IV  push  antihypertensive medication for blood pressure control,  should she complain of a persistent severe headache, should  her liver function tests increase or platelet counts decrease,  or should there be nonreassuring fetal status.  All conversations were held with the patient today with the  help of an Arabic interpreter.  Recommendations:  Inpatient management until delivery  Continue magnesium sulfate for maternal seizure prophylaxis  until her antenatal corticosteroid course is completed  Continue labetalol for blood pressure control  Delivery at 34 weeks  Delivery prior to 34 weeks would be indicated:           Should her blood pressures be persistently greater  than 160/100 despite treatment           Should she require IV push medication for blood  pressure control           Should she complain of any signs and symptoms of  severe preeclampsia           Should her Ong labs be abnormal           At any time for nonreassuring fetal status  A total of 30 minutes was spent counseling and coordinating  the care for this patient. ----------------------------------------------------------------------                   Johnell Comings, MD Electronically Signed Final Report   06/23/2021 05:49 pm ----------------------------------------------------------------------  [place] MFM FETAL BPP W/NONSTRESS  Result Date: 06/16/2021 ----------------------------------------------------------------------  OBSTETRICS REPORT                       (Signed Final 06/16/2021 09:19 am) ---------------------------------------------------------------------- Patient Info  ID #:        [id number]                          D.O.B.:  [birth year] (34 yrs)  Name:       Lind Covert                Visit Date: 06/16/2021 07:33 am              [place] ---------------------------------------------------------------------- Performed By  Attending:        Tama High MD        Secondary Phy.:   Graciela Husbands CNM  Performed By:     Lelan Pons RDMS       Address:          [address]  [place], [place]  Referred By:      [facility] MAU/Triage         Location[facility]         [facility]                                                             [facility] at                                                             [place] for                                                             Women ---------------------------------------------------------------------- Orders  #  Description                           Code        Ordered By  1  [place] MFM UA CORD DOPPLER                76820.02    RAVI SHANKAR  2  [place] MFM FETAL BPP                      35573.2     RAVI [facility]     W/NONSTRESS ----------------------------------------------------------------------  #  Order #                     Accession #                Episode #  1  [id number]                   [id number]                 [id number]  2  [id number]                   [id number]                 [id number] ---------------------------------------------------------------------- Indications  [redacted] weeks gestation of pregnancy                Z3A.32  Pre-existing diabetes, type 1, in pregnancy,   O24.013  third trimester  Maternal care for known or suspected poor      O36.5930  fetal growth, third trimester, not applicable or  unspecified IUGR  Insufficient Prenatal Care (transferred from   O15.30  [place])  Hypertension -  Chronic with superimposed  O11.9 O10.919  preeclampsia (Labetalol)  Advanced maternal age multigravida 61+,        O26.523  third trimester ---------------------------------------------------------------------- Fetal Evaluation  Num Of Fetuses:         1  Fetal Heart Rate(bpm):  138  Cardiac Activity:       Observed  Presentation:           Breech  Placenta:               Anterior  P. Cord Insertion:      Previously Visualized  Amniotic Fluid  AFI FV:      Within normal limits  AFI Sum(cm)     %Tile       Largest Pocket(cm)  22.91           89          7.47  RUQ(cm)       RLQ(cm)       LUQ(cm)        LLQ(cm)  5.07          5             7.47           5.37 ---------------------------------------------------------------------- Biophysical Evaluation  Amniotic F.V:   Within normal limits       F. Tone:        Observed  F. Movement:    Observed                   N.S.T:          Reactive  F. Breathing:   Observed                   Score:          10/10 ---------------------------------------------------------------------- Biometry  LV:          5  mm ---------------------------------------------------------------------- OB History  Gravidity:    2         Term:   0        Prem:   0        SAB:   1  TOP:          0       Ectopic:  0        Living: 0 ---------------------------------------------------------------------- Gestational Age  LMP:           33w 6d        Date:  10/22/20                 EDD:   07/29/21  Best:          Milderd Meager 0d     Det. By:  U/S  (04/11/21)          EDD:   08/11/21 ---------------------------------------------------------------------- Anatomy  Ventricles:            Appears normal         Kidneys:                Appear normal  Heart:                 Appears normal         Bladder:                Appears normal                         (4CH, axis, and  situs)  Stomach:               Appears normal, left                         sided  ---------------------------------------------------------------------- Doppler - Fetal Vessels  Umbilical Artery   S/D     %tile      RI    %tile      PI    %tile            ADFV    RDFV   2.48       38     0.6       45    0.91       52               No      No ---------------------------------------------------------------------- Cervix Uterus Adnexa  Cervix  Not visualized (advanced GA >24wks)  Right Ovary  Not visualized.  Left Ovary  Not visualized. ---------------------------------------------------------------------- Impression  Fetal growth restriction.  On ultrasound performed last week,  the estimated fetal weight was at the 9th percentile.  Patient has type 1 diabetes and takes insulin for control.  Fetal echocardiography performed earlier was normal.  She has chronic hypertension and takes labetalol.  Patient  did not take her morning dose yet.  Blood pressure is today at  her office was 160/88 mmHg and repeat 146/84 mmHg.  She  reports she has been having intermittent headaches.  No  Amniotic fluid is normal good fetal activity seen.  Antenatal  testing is reassuring.  Cephalic presentation.  Umbilical artery  Doppler showed normal forward diastolic flow.  NST is  reactive.  BPP 10/10.  I counseled the patient with help of language interpreter  present in the room.  I encouraged her to go to the MAU if  she has severe headaches or visual disturbances or right  upper quadrant pain. ---------------------------------------------------------------------- Recommendations  -Continue weekly BPP and UA Doppler till delivery. ----------------------------------------------------------------------                  Tama High, MD Electronically Signed Final Report   06/16/2021 09:19 am ----------------------------------------------------------------------  [place] MFM FETAL BPP WO NON STRESS  Result Date: 06/26/2021 ----------------------------------------------------------------------  OBSTETRICS REPORT                        (Signed Final 06/26/2021 01:26 pm) ---------------------------------------------------------------------- Patient Info  ID #:       [id number]                          D.O.B.:  01-29-1986 (35 yrs)  Name:       Lind Covert                Visit Date: 06/26/2021 07:09 am              [place] ---------------------------------------------------------------------- Performed By  Attending:        Johnell Comings MD         Secondary Phy.:   MELANIE N  Dyess CNM  Performed By:     Germain Osgood            Address:          [address]                                                             [place], [place]  Referred By:      [facility]          Location:         [facility] [place]  Ref. Address:     [address]                    [place], [place] ---------------------------------------------------------------------- Orders  #  Description                           Code        Ordered By  1  [place] MFM FETAL BPP WO NON               [id number]    JENNIFER OZAN     STRESS  2  [place] MFM UA CORD DOPPLER                33545.62    Janyth Pupa ----------------------------------------------------------------------  #  Order #                     Accession #                Episode #  1  [id number]                   [id number]                 [id number]  2  [id number]                   [id number]  [id number] ---------------------------------------------------------------------- Indications  Maternal care for known or suspected poor      O36.5930  fetal growth, third trimester, not applicable or  unspecified IUGR  Advanced maternal age multigravida 60+,        O86.523  third trimester  Hypertension -  Chronic with superimposed       O11.9 O10.919  preeclampsia (Labetalol)  Pre-existing diabetes, type 1, in pregnancy,   O24.013  third trimester  Insufficient Prenatal Care (transferred from   O09.30  [place])  [redacted] weeks gestation of pregnancy                Z3A.33 ---------------------------------------------------------------------- Fetal Evaluation  Num Of Fetuses:         1  Fetal Heart Rate(bpm):  150  Cardiac Activity:       Observed  Presentation:           Cephalic  Placenta:               Anterior  P. Cord Insertion:      Visualized, central  Amniotic Fluid  AFI FV:      Within normal limits  AFI Sum(cm)     %Tile       Largest Pocket(cm)  12.3            35          5.2  RUQ(cm)       RLQ(cm)       LUQ(cm)        LLQ(cm)  1.4           1.6           4.1            5.2 ---------------------------------------------------------------------- Biophysical Evaluation  Amniotic F.V:   Within normal limits       F. Tone:        Observed  F. Movement:    Observed                   Score:          8/8  F. Breathing:   Observed ---------------------------------------------------------------------- Biometry  LV:        1.1  mm ---------------------------------------------------------------------- OB History  Gravidity:    2         Term:   0        Prem:   0        SAB:   1  TOP:          0       Ectopic:  0        Living: 0 ---------------------------------------------------------------------- Gestational Age  LMP:           35w 2d        Date:  10/22/20                 EDD:   07/29/21  Best:          33w 3d     Det. By:  U/S  (04/11/21)          EDD:   08/11/21 ---------------------------------------------------------------------- Anatomy  Ventricles:            Appears normal         Kidneys:                Appear normal  Diaphragm:             Appears normal  Bladder:                Appears normal  Stomach:               Appears normal, left                         sided  ---------------------------------------------------------------------- Doppler - Fetal Vessels  Umbilical Artery   S/D     %tile      RI    %tile      PI    %tile            ADFV    RDFV   2.38       37    0.58       42    0.83       41               No      No ---------------------------------------------------------------------- Cervix Uterus Adnexa  Cervix  Not visualized (advanced GA >24wks) ---------------------------------------------------------------------- Comments  This patient has been hospitalized due to IUGR and  superimposed preeclampsia.  She has a history of chronic  hypertension and pregestational diabetes.  A biophysical profile performed today was 8 out of 8.  There was normal amniotic fluid noted on today's ultrasound  exam.  Doppler studies of the umbilical arteries performed today  showed a normal S/D ratio of 2.38.  There were no signs of  absent or reversed end-diastolic flow.  She is already scheduled for a cesarean delivery on Monday  June 30, 2021. ----------------------------------------------------------------------                   Johnell Comings, MD Electronically Signed Final Report   06/26/2021 01:26 pm ----------------------------------------------------------------------  [place] MFM OB FOLLOW UP  Result Date: 06/09/2021 ----------------------------------------------------------------------  OBSTETRICS REPORT                       (Signed Final 06/09/2021 11:01 am) ---------------------------------------------------------------------- Patient Info  ID #:       [id number]                          D.O.B.:  09-Dec-1985 (34 yrs)  Name:       Lind Covert                Visit Date: 06/09/2021 09:15 am              [place] ---------------------------------------------------------------------- Performed By  Attending:        Tama High MD        Secondary Phy.:   Graciela Husbands CNM  Performed By:     Germain Osgood             Address:          [address]  [place], [place]  Referred By:      [facility] MA[facility]/Triage         [facility]         [facility]                                                             [place] for                                                             Women ---------------------------------------------------------------------- Orders  #  Description                           Code        Ordered By  1  [place] MFM OB FOLLOW UP                   76816.01    RAVI SHANKAR  2  [place] MFM UA CORD DOPPLER                76820.02    [place] ----------------------------------------------------------------------  #  Order #                     Accession #                Episode #  1  [id number]                   [id number]                 [id number]  2  [id number]                   [id number]                 [id number] ---------------------------------------------------------------------- Indications  Pre-existing diabetes, type 1, in pregnancy,   O24.012  second trimester  Maternal care for known or suspected poor      O36.5920  fetal growth,  second trimester, not applicable  or unspecified IUGR  Insufficient Prenatal Care (transferred from   O46.30  [place])  [redacted] weeks gestation of pregnancy                Z3A.31  Hypertension - Chronic with superimposed       O11.9 O10.919  preeclampsia (Labetalol) ---------------------------------------------------------------------- Fetal Evaluation  Num Of Fetuses:         1  Fetal Heart Rate(bpm):  143  Cardiac Activity:       Observed  Presentation:           Cephalic  Placenta:               Anterior  P. Cord Insertion:      Visualized, central  Amniotic Fluid  AFI FV:      Within normal limits  AFI  Sum(cm)     %Tile       Largest Pocket(cm)  21.32           83          6.19  RUQ(cm)       RLQ(cm)       LUQ(cm)        LLQ(cm)  6.19          4.7           5.38           5.05 ---------------------------------------------------------------------- Biometry  BPD:      77.6  mm     G. Age:  31w 1d         43  %    CI:        79.96   %    70 - 86                                                          FL/HC:      20.1   %    19.3 - 21.3  HC:      274.2  mm     G. Age:  30w 0d        3.1  %    HC/AC:      1.06        0.96 - 1.17  AC:      257.8  mm     G. Age:  30w 0d         18  %    FL/BPD:     71.1   %    71 - 87  FL:       55.2  mm     G. Age:  29w 1d        3.6  %    FL/AC:      21.4   %    20 - 24  HUM:      51.2  mm     G. Age:  30w 0d         30  %  LV:        3.9  mm  Est. FW:    1450  gm      3 lb 3 oz      9  % ---------------------------------------------------------------------- OB History  Gravidity:    2  Term:   0        Prem:   0        SAB:   1  TOP:          0       Ectopic:  0        Living: 0 ---------------------------------------------------------------------- Gestational Age  LMP:           32w 6d        Date:  10/22/20                 EDD:   07/29/21  U/S Today:     30w 1d                                        EDD:   08/17/21  Best:          31w 0d     Det. By:  U/S  (04/11/21)          EDD:   08/11/21 ---------------------------------------------------------------------- Anatomy  Cranium:               Appears normal         Aortic Arch:            Previously seen  Cavum:                 Appears normal         Ductal Arch:            Previously seen  Ventricles:            Appears normal         Diaphragm:              Appears normal  Choroid Plexus:        Previously seen        Stomach:                Appears normal, left                                                                        sided  Cerebellum:            Previously seen        Abdomen:                Previously seen   Posterior Fossa:       Previously seen        Abdominal Wall:         Previously seen  Nuchal Fold:           Not applicable (>78    Cord Vessels:           Previously seen                         wks GA)  Face:                  Orbits and profile     Kidneys:                Appear normal  previously seen  Lips:                  Previously seen        Bladder:                Appears normal  Thoracic:              Previously seen        Spine:                  Previously seen  Heart:                 Appears normal         Upper Extremities:      Previously seen                         (4CH, axis, and                         situs)  RVOT:                  Previously seen        Lower Extremities:      Previously seen  LVOT:                  Previously seen  Other:  Female gender previously visualized. Heels previously visualized.          Nasal bone previously visualized. Right open hand visualized.          IVC/SVC prev visualized. Technically difficult due to fetal position. ---------------------------------------------------------------------- Doppler - Fetal Vessels  Umbilical Artery   S/D     %tile      RI    %tile      PI    %tile            ADFV    RDFV   2.55       37    0.61       40    0.89       41               No      No ---------------------------------------------------------------------- Cervix Uterus Adnexa  Cervix  Normal appearance by transabdominal scan.  Uterus  No abnormality visualized. ---------------------------------------------------------------------- Impression  Patient returned for fetal growth assessment.  She has type 1 diabetes.  She takes Novolin 12 units in the  morning and 12 units at night and NovoLog 5 units with each  meals.  She reports her fasting levels are between 95 and  100 mg/DL.  Fetal echocardiography performed earlier was reported as  normal.  Chronic hypertension.  Well-controlled on labetalol.  Blood  pressure today at her office is 137/89  mmHg.  Her pregnancy is dated by first ultrasound performed at our  office at [redacted] weeks gestation.  On today's ultrasound, the estimated fetal weight is at the 9th  percentile.  Abdominal circumference measurement is at the  18th percentile.  Amniotic fluid is normal and good fetal  activity seen.  Cephalic presentation.  Placenta is anterior.  There is no free-floating umbilical cord below the presenting  part (fetal head).  Umbilical artery Doppler showed normal forward diastolic flow  I explained the finding of fetal growth restriction that is difficult  to differentiate from a constitutionally small fetus.  I explained  our ultrasound protocol of weekly fetal monitoring.  I encouraged the patient to bring her blood glucose log at her  ultrasound visits.  I advised her to increase bedtime Novolin  to 14 units. ---------------------------------------------------------------------- Recommendations  -Appointments were made for weekly BPP, UA Doppler and  NST.  -Fetal growth in 3 weeks.  -Patient to bring her blood glucose log at her ultrasound visits. ----------------------------------------------------------------------                  Tama High, MD Electronically Signed Final Report   06/09/2021 11:01 am ----------------------------------------------------------------------  [place] MFM UA CORD DOPPLER  Result Date: 06/26/2021 ----------------------------------------------------------------------  OBSTETRICS REPORT                       (Signed Final 06/26/2021 01:26 pm) ---------------------------------------------------------------------- Patient Info  ID #:       [id number]                          D.O.B.:  15-Jul-1986 (35 yrs)  Name:       Lind Covert                Visit Date: 06/26/2021 07:09 am              [place] ---------------------------------------------------------------------- Performed By  Attending:        Johnell Comings MD         Secondary Phy.:   Graciela Husbands CNM  Performed By:     Germain Osgood            Address:          [address]                                                             [place], [place]  Referred By:      [facility]          Location:         [facility]  [place]  Ref. Address:     [address]                    [place], [place] ---------------------------------------------------------------------- Orders  #  Description                           Code        Ordered By  1  [place] MFM FETAL BPP WO NON               [id number]    Janyth Pupa     STRESS  2  [place] MFM UA CORD DOPPLER                [id number]    Janyth Pupa ----------------------------------------------------------------------  #  Order #                     Accession #                Episode #  1  [id number]                   [id number]                 [id number]  2  [id number]                   [id number]                 [id number] ---------------------------------------------------------------------- Indications  Maternal care for known or suspected poor      O36.5930  fetal growth, third trimester, not applicable or  unspecified IUGR  Advanced maternal age multigravida 71+,        O4.523  third trimester  Hypertension - Chronic with superimposed       O11.9 O10.919  preeclampsia (Labetalol)  Pre-existing diabetes, type 1, in pregnancy,   O24.013  third trimester  Insufficient Prenatal Care (transferred from   O09.30  [place])  [redacted] weeks gestation of pregnancy                Z3A.33 ---------------------------------------------------------------------- Fetal Evaluation  Num Of Fetuses:         1  Fetal Heart Rate(bpm):  150  Cardiac Activity:       Observed  Presentation:           Cephalic  Placenta:                Anterior  P. Cord Insertion:      Visualized, central  Amniotic Fluid  AFI FV:      Within normal limits  AFI Sum(cm)     %Tile       Largest Pocket(cm)  12.3            35          5.2  RUQ(cm)       RLQ(cm)       LUQ(cm)        LLQ(cm)  1.4           1.6           4.1            5.2 ---------------------------------------------------------------------- Biophysical Evaluation  Amniotic F.V:   Within normal limits       F. Tone:  Observed  F. Movement:    Observed                   Score:          8/8  F. Breathing:   Observed ---------------------------------------------------------------------- Biometry  LV:        1.1  mm ---------------------------------------------------------------------- OB History  Gravidity:    2         Term:   0        Prem:   0        SAB:   1  TOP:          0       Ectopic:  0        Living: 0 ---------------------------------------------------------------------- Gestational Age  LMP:           35w 2d        Date:  10/22/20                 EDD:   07/29/21  Best:          33w 3d     Det. By:  U/S  (04/11/21)          EDD:   08/11/21 ---------------------------------------------------------------------- Anatomy  Ventricles:            Appears normal         Kidneys:                Appear normal  Diaphragm:             Appears normal         Bladder:                Appears normal  Stomach:               Appears normal, left                         sided ---------------------------------------------------------------------- Doppler - Fetal Vessels  Umbilical Artery   S/D     %tile      RI    %tile      PI    %tile            ADFV    RDFV   2.38       37    0.58       42    0.83       41               No      No ---------------------------------------------------------------------- Cervix Uterus Adnexa  Cervix  Not visualized (advanced GA >24wks) ---------------------------------------------------------------------- Comments  This patient has been hospitalized due to IUGR and   superimposed preeclampsia.  She has a history of chronic  hypertension and pregestational diabetes.  A biophysical profile performed today was 8 out of 8.  There was normal amniotic fluid noted on today's ultrasound  exam.  Doppler studies of the umbilical arteries performed today  showed a normal S/D ratio of 2.38.  There were no signs of  absent or reversed end-diastolic flow.  She is already scheduled for a cesarean delivery on Monday  June 30, 2021. ----------------------------------------------------------------------                   Johnell Comings, MD Electronically Signed Final Report   06/26/2021 01:26 pm ----------------------------------------------------------------------  [place] MFM UA CORD DOPPLER  Result Date: 06/23/2021 ----------------------------------------------------------------------  OBSTETRICS REPORT                       (  Signed Final 06/23/2021 05:49 pm) ---------------------------------------------------------------------- Patient Info  ID #:       [id number]                          D.O.B.:  09/26/1986 (35 yrs)  Name:       Lind Covert                Visit Date: 06/23/2021 07:39 am              [place] ---------------------------------------------------------------------- Performed By  Attending:        Johnell Comings MD         Secondary Phy.:   Graciela Husbands CNM  Performed By:     Jacob Moores BS,       Address:          [address], [place], [place]  Referred By:      [place]          Location:         [facility]                                [facility] at                                                             [facility]  Ref. Address:     [address]                    [place], [place] ---------------------------------------------------------------------- Orders  #  Description                           Code        Ordered By  1  [place] MFM UA CORD DOPPLER                [id number]    RAVI SHANKAR  2  [place] MFM FETAL BPP                      [id number]     RAVI [facility]     W/NONSTRESS ----------------------------------------------------------------------  #  Order #                     Accession #                Episode #  1  [id number]                   [id number]                 [id number]  2  [id number]                   [id number]                 [id number] ---------------------------------------------------------------------- Indications  Maternal care for known or suspected poor      O36.5930  fetal growth, third trimester, not applicable or  unspecified IUGR  Advanced maternal age multigravida 73+,        O71.523  third trimester  Hypertension - Chronic with superimposed       O11.9 O10.919  preeclampsia (Labetalol)  Pre-existing diabetes, type 1, in pregnancy,   O24.013  third trimester  [redacted] weeks gestation of pregnancy                Z3A.33  Insufficient Prenatal Care (transferred from   O48.30  [place]) ---------------------------------------------------------------------- Fetal Evaluation  Num Of Fetuses:         1  Fetal Heart Rate(bpm):  131  Cardiac Activity:       Observed  Presentation:           Breech  Placenta:               Anterior  P. Cord Insertion:      Previously Visualized  Amniotic Fluid  AFI FV:      Within normal limits  AFI Sum(cm)     %Tile       Largest Pocket(cm)  19.8            74          7.3  RUQ(cm)       RLQ(cm)       LUQ(cm)        LLQ(cm)  2.1           6.4           7.3            4 ---------------------------------------------------------------------- Biophysical Evaluation  Amniotic F.V:   Pocket => 2 cm  F. Tone:        Observed  F.  Movement:    Observed                   N.S.T:          Nonreactive  F. Breathing:   Observed                   Score:          8/10 ---------------------------------------------------------------------- OB History  Gravidity:    2         Term:   0        Prem:   0        SAB:   1  TOP:          0       Ectopic:  0        Living: 0 ---------------------------------------------------------------------- Gestational Age  LMP:           34w 6d        Date:  10/22/20                 EDD:   07/29/21  Best:          33w 0d     Det. By:  U/S  (04/11/21)          EDD:   08/11/21 ---------------------------------------------------------------------- Anatomy  Cranium:               Previously seen        Aortic Arch:            Previously seen  Cavum:                 Previously seen        Ductal Arch:            Previously seen  Ventricles:            Previously seen        Diaphragm:              Previously seen  Choroid Plexus:        Previously seen        Stomach:                Previously Seen  Cerebellum:            Previously seen        Abdomen:                Previously seen  Posterior Fossa:       Previously seen        Abdominal Wall:         Previously seen  Nuchal Fold:           Not applicable (>16    Cord Vessels:           Previously seen                         wks GA)  Face:                  Orbits and profile     Kidneys:                Previously seen                         previously seen  Lips:  Previously seen        Bladder:                Previously seen  Thoracic:              Previously seen        Spine:                  Previously seen  Heart:                 Previously seen        Upper Extremities:      Previously seen  RVOT:                  Previously seen        Lower Extremities:      Previously seen  LVOT:                  Previously seen  Other:  Female gender previously visualized. Heels, Nasal bone, Right open          hand, and IVC/SVC prev. visualized. Technically  difficult due to fetal          position. ---------------------------------------------------------------------- Doppler - Fetal Vessels  Umbilical Artery   S/D     %tile      RI    %tile      PI    %tile            ADFV    RDFV   3.06       76    0.67       78    1.05       80               No      No ---------------------------------------------------------------------- Cervix Uterus Adnexa  Cervix  Not visualized (advanced GA >24wks)  Uterus  No abnormality visualized.  Cul De Sac  No free fluid seen.  Adnexa  No abnormality visualized. ---------------------------------------------------------------------- Comments  Cheron Every was seen for a biophysical profile due to  chronic hypertension treated with labetalol and pregestational  diabetes treated with insulin.  She was diagnosed with  preeclampsia few weeks ago when her P/C ratio indicated  new onset proteinuria.  IUGR has also been noted in her  current pregnancy.  Her last growth scan two weeks ago  showed an EFW of 3 pounds 3 ounces (9th percentile for her  gestational age).  The patient reports that she has experienced a headache and  upper abdominal pain for the past 3 days.  Her blood  pressures in our office today were 163/92 and 162/95.  A biophysical profile performed today was 8 out of 8.  She  also had a nonreactive nonstress test making her total  biophysical profile score 8 out of 10.  There was normal  amniotic fluid noted today.  Doppler studies of the umbilical arteries performed today  showed a normal S/D ratio of 3.06.  There were no signs of  absent or reversed diastolic flow.  Due to her elevated blood pressures and symptoms along  with the fact that she has been diagnosed with preeclampsia,  the patient was sent to the hospital for admission to receive a  complete course of antenatal corticosteroids and  observation/delivery.  Due to preeclampsia with fetal growth restriction, I would  recommend inpatient management until delivery.   She may  continue the antihypertensive medications (labetalol) for  blood pressure  control.  As she is complaining of a headache,I would recommend that  she be continued on magnesium sulfate for maternal seizure  prophylaxis until her antenatal corticosteroid course is  completed.  The goal for her delivery would be 34 weeks.  Delivery prior to 34 weeks would be recommended if her  blood pressures remain persistently greater than 160/100  despite medication treatment, should she require IV push  antihypertensive medication for blood pressure control,  should she complain of a persistent severe headache, should  her liver function tests increase or platelet counts decrease,  or should there be nonreassuring fetal status.  All conversations were held with the patient today with the  help of an Arabic interpreter.  Recommendations:  Inpatient management until delivery  Continue magnesium sulfate for maternal seizure prophylaxis  until her antenatal corticosteroid course is completed  Continue labetalol for blood pressure control  Delivery at 34 weeks  Delivery prior to 34 weeks would be indicated:           Should her blood pressures be persistently greater  than 160/100 despite treatment           Should she require IV push medication for blood  pressure control           Should she complain of any signs and symptoms of  severe preeclampsia           Should her Spink labs be abnormal           At any time for nonreassuring fetal status  A total of 30 minutes was spent counseling and coordinating  the care for this patient. ----------------------------------------------------------------------                   Johnell Comings, MD Electronically Signed Final Report   06/23/2021 05:49 pm ----------------------------------------------------------------------  [place] MFM UA CORD DOPPLER  Result Date: 06/16/2021 ----------------------------------------------------------------------  OBSTETRICS REPORT                        (Signed Final 06/16/2021 09:19 am) ---------------------------------------------------------------------- Patient Info  ID #:       [id number]                          D.O.B.:  15-May-1986 (34 yrs)  Name:       Lind Covert                Visit Date: 06/16/2021 07:33 am              Upadhyay ---------------------------------------------------------------------- Performed By  Attending:        Tama High MD        Secondary Phy.:   Graciela Husbands CNM  Performed By:     Lelan Pons RDMS       Address:          [address]  [place], [place]  Referred By:      [facility] MAU/Triage         [facility]         [facility]                                                             [place] [facility]                                                             Women ---------------------------------------------------------------------- Orders  #  Description                           Code        Ordered By  1  [place] MFM UA CORD DOPPLER                76820.02    RAVI SHANKAR  2  [place] MFM FETAL BPP                      96283.6     RAVI [facility]     W/NONSTRESS ----------------------------------------------------------------------  #  Order #                     Accession #                Episode #  1  [id number]                   [id number]                 [id number]  2  [id number]                   [id number]                 [id number] ---------------------------------------------------------------------- Indications  [redacted] weeks gestation of pregnancy                Z3A.Parnell  Pre-existing diabetes, type 1, in pregnancy,   O24.013  third trimester  Maternal care for known or suspected poor      O36.5930  fetal growth,  third trimester, not applicable or  unspecified IUGR  Insufficient Prenatal Care (transferred from   O62.30  [place])  Hypertension - Chronic with superimposed       O11.9 O10.919  preeclampsia (Labetalol)  Advanced maternal age multigravida 48+,        O74.523  third trimester ---------------------------------------------------------------------- Fetal Evaluation  Num Of Fetuses:         1  Fetal Heart Rate(bpm):  138  Cardiac Activity:       Observed  Presentation:           Breech  Placenta:               Anterior  P. Cord Insertion:      Previously Visualized  Amniotic Fluid  AFI FV:      Within normal limits  AFI Sum(cm)     %Tile       Largest Pocket(cm)  22.91           89          7.47  RUQ(cm)       RLQ(cm)       LUQ(cm)        LLQ(cm)  5.07          5             7.47           5.37 ---------------------------------------------------------------------- Biophysical Evaluation  Amniotic F.V:   Within normal limits       F. Tone:        Observed  F. Movement:    Observed                   N.S.T:          Reactive  F. Breathing:   Observed                   Score:          10/10 ---------------------------------------------------------------------- Biometry  LV:          5  mm ---------------------------------------------------------------------- OB History  Gravidity:    2         Term:   0        Prem:   0        SAB:   1  TOP:          0       Ectopic:  0        Living: 0 ---------------------------------------------------------------------- Gestational Age  LMP:           33w 6d        Date:  10/22/20                 EDD:   07/29/21  Best:          Milderd Meager 0d     Det. By:  U/S  (04/11/21)          EDD:   08/11/21 ---------------------------------------------------------------------- Anatomy  Ventricles:            Appears normal         Kidneys:                Appear normal  Heart:  Appears normal         Bladder:                Appears normal                         (4CH, axis, and                          situs)  Stomach:               Appears normal, left                         sided ---------------------------------------------------------------------- Doppler - Fetal Vessels  Umbilical Artery   S/D     %tile      RI    %tile      PI    %tile            ADFV    RDFV   2.48       38     0.6       45    0.91       52               No      No ---------------------------------------------------------------------- Cervix Uterus Adnexa  Cervix  Not visualized (advanced GA >24wks)  Right Ovary  Not visualized.  Left Ovary  Not visualized. ---------------------------------------------------------------------- Impression  Fetal growth restriction.  On ultrasound performed last week,  the estimated fetal weight was at the 9th percentile.  Patient has type 1 diabetes and takes insulin for control.  Fetal echocardiography performed earlier was normal.  She has chronic hypertension and takes labetalol.  Patient  did not take her morning dose yet.  Blood pressure is today at  her office was 160/88 mmHg and repeat 146/84 mmHg.  She  reports she has been having intermittent headaches.  No  Amniotic fluid is normal good fetal activity seen.  Antenatal  testing is reassuring.  Cephalic presentation.  Umbilical artery  Doppler showed normal forward diastolic flow.  NST is  reactive.  BPP 10/10.  I counseled the patient with help of language interpreter  present in the room.  I encouraged her to go to the MAU if  she has severe headaches or visual disturbances or right  upper quadrant pain. ---------------------------------------------------------------------- Recommendations  -Continue weekly BPP and UA Doppler till delivery. ----------------------------------------------------------------------                  Tama High, MD Electronically Signed Final Report   06/16/2021 09:19 am ----------------------------------------------------------------------  [place] MFM UA CORD DOPPLER  Result Date:  06/09/2021 ----------------------------------------------------------------------  OBSTETRICS REPORT                       (Signed Final 06/09/2021 11:01 am) ---------------------------------------------------------------------- Patient Info  ID #:       [id number]                          D.O.B.:  1986-10-20 (34 yrs)  Name:       Lind Covert                Visit Date: 06/09/2021 09:15 am              [place] ---------------------------------------------------------------------- Performed By  Attending:  Tama High MD        Secondary Phy.:   Graciela Husbands CNM  Performed By:     Germain Osgood            Address:          [address]                                                             [place], [place]  Referred By:      [facility] MAU/Triage         Location:         [facility]                                                             [place] for                                                             Women ---------------------------------------------------------------------- Orders  #  Description                           Code        Ordered By  1  [place] MFM OB FOLLOW UP                   [id number]  RAVI SHANKAR  2  [place] MFM UA CORD DOPPLER                [id number]    RAVI [facility] ----------------------------------------------------------------------  #  Order #                     Accession #                Episode #  1  [id number]                   [id number]                 [id number]  2  [id number]                   [id number]                 [id number] ---------------------------------------------------------------------- Indications  Pre-existing diabetes, type 1, in pregnancy,   O24.012  second  trimester  Maternal care for known or suspected poor      O36.5920  fetal growth, second trimester, not applicable  or unspecified IUGR  Insufficient Prenatal Care (transferred from   O09.30  [place])  [redacted] weeks gestation of pregnancy                Z3A.31  Hypertension - Chronic with superimposed       O11.9 O10.919  preeclampsia (Labetalol) ---------------------------------------------------------------------- Fetal Evaluation  Num Of Fetuses:         1  Fetal Heart Rate(bpm):  143  Cardiac Activity:       Observed  Presentation:           Cephalic  Placenta:               Anterior  P. Cord Insertion:      Visualized, central  Amniotic Fluid  AFI FV:      Within normal limits  AFI Sum(cm)     %Tile       Largest Pocket(cm)  21.32           83          6.19  RUQ(cm)       RLQ(cm)       LUQ(cm)        LLQ(cm)  6.19          4.7           5.38           5.05 ---------------------------------------------------------------------- Biometry  BPD:      77.6  mm     G. Age:  31w 1d         43  %    CI:        79.96   %    70 - 86                                                          FL/HC:      20.1   %    19.3 - 21.3  HC:      274.2  mm     G. Age:  30w 0d        3.1  %    HC/AC:      1.06  0.96 - 1.17  AC:      257.8  mm     G. Age:  30w 0d         18  %    FL/BPD:     71.1   %    71 - 87  FL:       55.2  mm     G. Age:  29w 1d        3.6  %    FL/AC:      21.4   %    20 - 24  HUM:      51.2  mm     G. Age:  30w 0d         30  %  LV:        3.9  mm  Est. FW:    1450  gm      3 lb 3 oz      9  % ---------------------------------------------------------------------- OB History  Gravidity:    2         Term:   0        Prem:   0        SAB:   1  TOP:          0       Ectopic:  0        Living: 0 ---------------------------------------------------------------------- Gestational Age  LMP:           32w 6d        Date:  10/22/20                 EDD:   07/29/21  U/S Today:     30w 1d                                         EDD:   08/17/21  Best:          31w 0d     Det. By:  U/S  (04/11/21)          EDD:   08/11/21 ---------------------------------------------------------------------- Anatomy  Cranium:               Appears normal         Aortic Arch:            Previously seen  Cavum:                 Appears normal         Ductal Arch:            Previously seen  Ventricles:            Appears normal         Diaphragm:              Appears normal  Choroid Plexus:        Previously seen        Stomach:                Appears normal, left  sided  Cerebellum:            Previously seen        Abdomen:                Previously seen  Posterior Fossa:       Previously seen        Abdominal Wall:         Previously seen  Nuchal Fold:           Not applicable (>83    Cord Vessels:           Previously seen                         wks GA)  Face:                  Orbits and profile     Kidneys:                Appear normal                         previously seen  Lips:                  Previously seen        Bladder:                Appears normal  Thoracic:              Previously seen        Spine:                  Previously seen  Heart:                 Appears normal         Upper Extremities:      Previously seen                         (4CH, axis, and                         situs)  RVOT:                  Previously seen        Lower Extremities:      Previously seen  LVOT:                  Previously seen  Other:  Female gender previously visualized. Heels previously visualized.          Nasal bone previously visualized. Right open hand visualized.          IVC/SVC prev visualized. Technically difficult due to fetal position. ---------------------------------------------------------------------- Doppler - Fetal Vessels  Umbilical Artery   S/D     %tile      RI    %tile      PI    %tile            ADFV    RDFV   2.55       37    0.61       40    0.89       41                No      No ---------------------------------------------------------------------- Cervix Uterus Adnexa  Cervix  Normal appearance by transabdominal scan.  Uterus  No abnormality visualized. ---------------------------------------------------------------------- Impression  Patient returned for fetal growth assessment.  She has type 1 diabetes.  She takes Novolin 12 units in the  morning and 12 units at night and NovoLog 5 units with each  meals.  She reports her fasting levels are between 95 and  100 mg/DL.  Fetal echocardiography performed earlier was reported as  normal.  Chronic hypertension.  Well-controlled on labetalol.  Blood  pressure today at her office is 137/89 mmHg.  Her pregnancy is dated by first ultrasound performed at our  office at [redacted] weeks gestation.  On today's ultrasound, the estimated fetal weight is at the 9th  percentile.  Abdominal circumference measurement is at the  18th percentile.  Amniotic fluid is normal and good fetal  activity seen.  Cephalic presentation.  Placenta is anterior.  There is no free-floating umbilical cord below the presenting  part (fetal head).  Umbilical artery Doppler showed normal forward diastolic flow  I explained the finding of fetal growth restriction that is difficult  to differentiate from a constitutionally small fetus.  I explained  our ultrasound protocol of weekly fetal monitoring.  I encouraged the patient to bring her blood glucose log at her  ultrasound visits.  I advised her to increase bedtime Novolin  to 14 units. ---------------------------------------------------------------------- Recommendations  -Appointments were made for weekly BPP, UA Doppler and  NST.  -Fetal growth in 3 weeks.  -Patient to bring her blood glucose log at her ultrasound visits. ----------------------------------------------------------------------                  Tama High, MD Electronically Signed Final Report   06/09/2021 11:01 am  ----------------------------------------------------------------------   Microbiology: No results found for this or any previous visit (from the past 240 hour(s)).   Labs: CBC: Recent Labs  Lab 07/04/21 1704 07/05/21 0202  WBC 5.1 7.9  NEUTROABS 3.1  --   HGB 10.6* 10.9*  HCT 31.6* 34.3*  MCV 88.0 91.0  PLT 328 092   Basic Metabolic Panel: Recent Labs  Lab 07/04/21 1704 07/05/21 0202  NA 137 133*  K 3.7 4.4  CL 108 105  CO2 22 21*  GLUCOSE 105* 267*  BUN 12 14  CREATININE 0.38* 0.45  CALCIUM 8.5* 8.8*   Liver Function Tests: Recent Labs  Lab 07/04/21 1704  AST 11*  ALT 13  ALKPHOS 87  BILITOT 0.8  PROT 6.7  ALBUMIN 2.5*   CBG: Recent Labs  Lab 07/05/21 1236 07/05/21 1750 07/05/21 2036 07/06/21 0808 07/06/21 1141  GLUCAP 148* 183* 343* 363* 148*    Time spent: 35 minutes  Signed:  Berle Mull  [facility] 07/06/2021

## 2021-07-08 NOTE — Lactation Note (Signed)
This note was copied from a baby's chart. Lactation Consultation Note  Patient Name: Kristin Clay Date: 07/08/2021 Reason for consult: Follow-up assessment;NICU baby;Late-preterm 34-36.6wks;Primapara;1st time breastfeeding;Infant < 6lbs Age:36 days  [place] interpreter services used for Arabic; interpreter # [phone] "Jenan"  Visited with mom of 36 days old old LPI (adjusted) NICU female, she asked for [facility] because her flanges were too big. Mom was given # 21 flanges on admission but they got misplaced after her discharge and she's been using the # 24 instead. This LC issued a new set of # 21 flanges, mom was very appreciative.  She also reported she's been taking baby to breast, and doing some STS. Encouraged her to keep doing so based on feeding cues when baby shows readiness. Mom also asked about galactagogues, hydration during lactation was also discussed.  Plan of care:  Encouraged mom to start pumping consistently; at least 8 times/24 hours She'll continue taking baby to breast and doing STS based on feeding cues  FOB present. Parents reported all questions and concerns were answered, they're both aware of NICU LC services and will call PRN.  Maternal Data   Mom's supply is BLN, her milk hasn't come in yet; probably due to insufficient glandular tissue  Feeding Mother's Current Feeding Choice: Breast Milk and Formula  LATCH Score Latch: Repeated attempts needed to sustain latch, nipple held in mouth throughout feeding, stimulation needed to elicit sucking reflex.  Audible Swallowing: A few with stimulation  Type of Nipple: Everted at rest and after stimulation  Comfort (Breast/Nipple): Soft / non-tender  Hold (Positioning): Assistance needed to correctly position infant at breast and maintain latch.  LATCH Score: 7   Lactation Tools Discussed/Used Tools: Pump;Coconut oil;Flanges Flange Size: 21 Breast pump type: Double-Electric Breast Pump Pump  Education: Setup, frequency, and cleaning;Milk Storage Reason for Pumping: LPI < 5 lbs in NICU Pumping frequency: inconsistently, probably 5 times/24 hours, even though mom said it's q 3 hours (missed 3 pumping sessions at the time of this LC visit) Pumped volume: 8 mL  Interventions Interventions: Breast feeding basics reviewed;DEBP;Education  Discharge Pump: DEBP  Consult Status Consult Status: Follow-up Follow-up type: In-patient    Kristin Clay 07/08/2021, 12:24 PM

## 2021-07-10 ENCOUNTER — Ambulatory Visit: Payer: 59 | Admitting: Obstetrics and Gynecology

## 2021-07-11 ENCOUNTER — Telehealth (HOSPITAL_COMMUNITY): Payer: Self-pay | Admitting: *Deleted

## 2021-07-11 ENCOUNTER — Ambulatory Visit: Payer: Self-pay

## 2021-07-11 NOTE — Lactation Note (Addendum)
This note was copied from a baby's chart. Lactation Consultation Note  Patient Name: Girl camille dragan VZSMO'L Date: 07/11/2021 Reason for consult: Follow-up assessment;Mother's request;Late-preterm 34-36.6wks;Infant < 6lbs;Other (Comment);NICU baby (fedding assist) Age:36 days  [employer] interpreter services for Arabic, interpreter # [phone], "Ronnie"  Visited with mom of 26 days old LPI female, she requested a feeding assist. Baby just went "ad lib" this AM, mom has been putting her to breast consistently, however she's not pumping as much.  Explained to mom the importance of consistent pumping but she voiced that "nothing" will come up with the pump, unlike when she uses her hands. Encouraged mom to hand express instead if she's getting more volumes with hand expression.  LC assisted with latch to both, right and left breast, mom required assistance with positioning but she's very independent too, however she needs some guidance, she's a first time mom. Baby fed for 22 minutes in cross cradle hold loosing the latch on and off but with some audible swallows.  Reviewed benefits of STS care and how to not overtire baby with BF, so she has enough energy to also get her bottle.  Plan of care:   Encouraged mom to start pumping/ hand expressing consistently; at least 8 times/24 hours She'll continue taking baby to breast and doing STS based on feeding cues. Will try NS 16-20 on next feeding assist   No other support person at this time. Mom reported all questions and concerns were answered, she's aware of NICU LC services and will call PRN.  Maternal Data   Mom's supply is BNL; although she can easily hand express  Feeding Mother's Current Feeding Choice: Breast Milk and Formula Nipple Type: Dr. Levert Feinstein Preemie  LATCH Score Latch: Repeated attempts needed to sustain latch, nipple held in mouth throughout feeding, stimulation needed to elicit sucking  reflex.  Audible Swallowing: A few with stimulation (with compressions)  Type of Nipple: Everted at rest and after stimulation  Comfort (Breast/Nipple): Soft / non-tender  Hold (Positioning): Assistance needed to correctly position infant at breast and maintain latch.  LATCH Score: 7   Lactation Tools Discussed/Used Tools: Pump;Flanges Flange Size: 21 Breast pump type: Double-Electric Breast Pump Pump Education: Setup, frequency, and cleaning;Milk Storage Reason for Pumping: LPI in NICU < 5 lbs Pumping frequency: 5 times/24 hours Pumped volume: 1 mL (per mom, but she was spraying milk when doing hand expression)  Interventions Interventions: Breast feeding basics reviewed;Assisted with latch;Skin to skin;Hand express;Breast compression;Adjust position;Support pillows;DEBP;Education  Discharge Pump: DEBP (WIC pump)  Consult Status Consult Status: Follow-up Follow-up type: In-patient    Brayden Betters Venetia Constable 07/11/2021, 11:22 AM

## 2021-07-11 NOTE — Telephone Encounter (Signed)
Unable to leave message.  Duffy Rhody, RN 07-11-2021 at 11:06am

## 2021-07-12 ENCOUNTER — Ambulatory Visit: Payer: Self-pay

## 2021-07-12 NOTE — Lactation Note (Signed)
This note was copied from a baby's chart. Lactation Consultation Note  Patient Name: Kristin Clay GRMBO'B Date: 07/12/2021   Age:36 days  Attempted to visit with mom for the 11 am feeding but she wasn't in the room. NICU RN Larkin Ina reported that mom hasn't arrived to the hospital yet. Asked NICU RN to call LC if mom needs assistance with BF; baby started feeding ad lib yesterday.   [name] S Sheronda Parran 07/12/2021, 11:13 AM

## 2021-07-12 NOTE — Lactation Note (Addendum)
This note was copied from a baby's chart. Lactation Consultation Note  Patient Name: Kristin Clay OVZCH'Y Date: 07/12/2021 Reason for consult: Follow-up assessment;Mother's request;Other (Comment);Late-preterm 34-36.6wks;Infant < 6lbs;NICU baby (feeding assist) Age:36 days  [employer] interpreter services for Arabic, interpreter # [phone] "Kristin Clay"  NICU RN Kristin Clay called again, baby was cueing and ready to feed. Baby already nursing for 5 minutes when entered the room.  LC tried a NS # 16; baby able to latch and sustain the latch with it for about 4 minutes, but she spit it out afterwards and got really fussy when tried to put it back on. Most of this feeding was at the bare breast (R); baby fed for a total of 19 minutes; with both NS and NNS being the second the predominat.  Plan of care:   Encouraged mom to start pumping/ hand expressing consistently; at least 8 times/24 hours She'll continue taking baby to breast and doing STS based on feeding cues using NS # 16 if needed   FOB present. Parents reported all questions and concerns were answered, they're both aware of NICU LC services and will call PRN.  Maternal Data    Feeding Mother's Current Feeding Choice: Breast Milk and Formula  LATCH Score Latch: Repeated attempts needed to sustain latch, nipple held in mouth throughout feeding, stimulation needed to elicit sucking reflex. (with and without NS # 16)  Audible Swallowing: A few with stimulation (only a few with compressions)  Type of Nipple: Everted at rest and after stimulation  Comfort (Breast/Nipple): Soft / non-tender  Hold (Positioning): Assistance needed to correctly position infant at breast and maintain latch.  LATCH Score: 7   Lactation Tools Discussed/Used    Interventions Interventions: Breast feeding basics reviewed;Assisted with latch;Breast massage;Hand express;Breast compression;Adjust position;Support  pillows;DEBP  Discharge Pump: DEBP  Consult Status Consult Status: Follow-up Follow-up type: In-patient    Kristin Clay 07/12/2021, 3:03 PM

## 2021-07-12 NOTE — Lactation Note (Signed)
This note was copied from a baby's chart. Lactation Consultation Note  Patient Name: Kristin Clay Date: 07/12/2021 Reason for consult: Follow-up assessment;Mother's request;Late-preterm 34-36.6wks;Other (Comment);Infant < 6lbs;NICU baby (feeding assist) Age:36 days  Used [employer] interpreter for Arabic, interpreter # [phone] "Ghada"  NICU RN Lili called LC; mom arrived to the unit. Upon arrival noticed that baby was asleep and swaddled. As soon as mom saw LC, she started moving and touching baby to wake her up.  LC explained to mom that baby is ad lib and we should let baby lead the feedings and should only attempt the latch when baby is ready. Mom voiced understanding, baby didn't wake up after multiple attempts.  LC told mom that it hasn't been 3 hours since the last feeding, but baby still will get her feed anyway before the 4 hours mark in case she doesn't wake up. Baby no longer has a feeding tube; she's just going to breast and getting bottles.  Continue current plan of care, mom to call NICU LC PRN on feeding cues to assist with latching.  Feeding Mother's Current Feeding Choice: Breast Milk and Formula Nipple Type: Dr. Levert Feinstein Preemie  Lactation Tools Discussed/Used    Interventions Interventions: Breast feeding basics reviewed;DEBP;Education  Discharge    Consult Status Consult Status: Follow-up Follow-up type: In-patient    Kristin Clay 07/12/2021, 1:22 PM

## 2021-07-17 ENCOUNTER — Encounter: Payer: Self-pay | Admitting: Obstetrics and Gynecology

## 2021-07-17 ENCOUNTER — Other Ambulatory Visit: Payer: Self-pay

## 2021-07-17 ENCOUNTER — Ambulatory Visit: Payer: Self-pay

## 2021-07-24 ENCOUNTER — Inpatient Hospital Stay: Payer: 59 | Admitting: Family Medicine

## 2021-08-04 ENCOUNTER — Other Ambulatory Visit: Payer: Self-pay

## 2021-08-04 ENCOUNTER — Encounter: Payer: Self-pay | Admitting: Obstetrics and Gynecology

## 2021-08-04 ENCOUNTER — Ambulatory Visit (INDEPENDENT_AMBULATORY_CARE_PROVIDER_SITE_OTHER): Payer: 59 | Admitting: Obstetrics and Gynecology

## 2021-08-04 ENCOUNTER — Other Ambulatory Visit (HOSPITAL_COMMUNITY)
Admission: RE | Admit: 2021-08-04 | Discharge: 2021-08-04 | Disposition: A | Discharge status: Home / Self Care | Payer: 59 | Source: Ambulatory Visit | Attending: Obstetrics and Gynecology | Admitting: Obstetrics and Gynecology

## 2021-08-04 VITALS — BP 121/85 | HR 88 | Ht 64.0 in | Wt 103.0 lb

## 2021-08-04 DIAGNOSIS — Z23 Encounter for immunization: Secondary | ICD-10-CM

## 2021-08-04 DIAGNOSIS — I10 Essential (primary) hypertension: Secondary | ICD-10-CM | POA: Diagnosis not present

## 2021-08-04 DIAGNOSIS — Z9119 Patient's noncompliance with other medical treatment and regimen: Secondary | ICD-10-CM | POA: Diagnosis not present

## 2021-08-04 DIAGNOSIS — N898 Other specified noninflammatory disorders of vagina: Secondary | ICD-10-CM | POA: Diagnosis not present

## 2021-08-04 DIAGNOSIS — Z91199 Patient's noncompliance with other medical treatment and regimen due to unspecified reason: Secondary | ICD-10-CM | POA: Insufficient documentation

## 2021-08-04 NOTE — Addendum Note (Signed)
Addended by: Henrietta Dine on: 08/04/2021 10:11 AM   Modules accepted: Orders

## 2021-08-04 NOTE — Progress Notes (Signed)
Post Partum Visit Note  Kristin Clay is a 35 y.o. [id number] s/p 8/7 low forceps delivery/3c with right mediolateral episotomy @ 33wks due to severe pre-eclampsia with FGR.   Pregnancy c/b poor compliance, DM1, cHTN, female circumcision  Anesthesia: epidural. Postpartum course has been c/b admission on 8/12 to 8/14 on the IM/neurosugery service for likely epidural hematoma that was treated with steroids.  LMP: last week, 5 days. No sex since delivery.  Baby is feeding by bottle - [employer] Good Start. Bleeding: none Bowel function is normal and specifically patient denies any incontinence issues. Bladder function is normal with rare SUI. Contraception: abstinence.   Patient endorses discomfort at the perineal area and feels that it is "not healing right".  Patient no showed to her 8/18 routine post delivery laceration check for third degree lacerations that was scheduled at discharge. Back normal.    Edinburgh Postnatal Depression Scale - 08/04/21 0849       Edinburgh Postnatal Depression Scale:  In the Past 7 Days   I have been able to laugh and see the funny side of things. 0    I have looked forward with enjoyment to things. 0    I have blamed myself unnecessarily when things went wrong. 0    I have been anxious or worried for no good reason. 0    I have felt scared or panicky for no good reason. 0    Things have been getting on top of me. 0    I have been so unhappy that I have had difficulty sleeping. 0    I have felt sad or miserable. 0    I have been so unhappy that I have been crying. 0    The thought of harming myself has occurred to me. 0    Edinburgh Postnatal Depression Scale Total 0             Review of Systems Pertinent items are noted in HPI. Pertinent items noted in HPI and remainder of comprehensive ROS otherwise negative.  Objective:  BP 121/85   Pulse 88   Ht 5\' 4"  (1.626 m)   Wt 103 lb (46.7 kg)   Breastfeeding No   BMI 17.68 kg/m     NAD At the episotomy site, approx 1.5cm circumferential area healing slowly with granulation tissue, minimally ttp, no e/o infection  EGBUS: normal Vagina: white/yellow d/c in vault Cervix: normal Abdomen: soft, nttp, nd Assessment:  Pt stable   Plan:   *PP: recommend continued pelvic rest and f/u in one month for annual and repeat exam. I suspect that it should heal fine with expectant management, as with the rare episodes of SUI. Swab sent for discharge.   Pt declines anything for birth control. I strongly encouraged to do something and to avoid pregnancy for at least two years, and I told her that the reason she had so many complications with the pregnancy (pre-eclampsia, small baby, etc) is due to her medical conditions not being taken care of which needs to be done before any future pregnancy.  She had a 9/1 Dr. 11/1 visit at the Primary Care site at [facility].  She also no showed to a 11/20/20 PCP visit [facility] and to a 02/27/2020 PCP visit with [facility].    I stressed to her the importance of a PCP, as she states that, before her pregnancy, she was using old medicine that she had on had for her insulin. She  states that when she wasn't pregnant that she was given a number to call for a PCP appointment that she just never did.  *cHTN: She states she is taking the procardia 60 bid. Continue this *DM1: see above. She states that she is taking humulin 70/30 10 units twice a day. She said she last checked her CBG a few days ago after a meal and she said it was "okay." I asked her why she wasn't checking her sugars more frequently and she said that she didn't know. Importance of checking sugars d/w her.   Interpreter used  [place] Bing, MD Center for [employer], [facility]

## 2021-08-04 NOTE — Patient Instructions (Signed)
Make another appointment with your primary care doctor at [facility] Phone number: [phone]

## 2021-08-05 LAB — CERVICOVAGINAL ANCILLARY ONLY
Bacterial Vaginitis (gardnerella): POSITIVE — AB
Candida Glabrata: NEGATIVE
Candida Vaginitis: NEGATIVE
Chlamydia: NEGATIVE
Comment: NEGATIVE
Comment: NEGATIVE
Comment: NEGATIVE
Comment: NEGATIVE
Comment: NEGATIVE
Comment: NORMAL
Neisseria Gonorrhea: NEGATIVE
Trichomonas: NEGATIVE

## 2021-08-07 MED ORDER — METRONIDAZOLE 500 MG PO TABS: 500.0000 mg | ORAL_TABLET | Freq: Two times a day (BID) | ORAL | 0 refills | Status: AC

## 2021-08-07 NOTE — Addendum Note (Signed)
Addended by: [place] Bing on: 08/07/2021 08:28 AM   Modules accepted: Orders

## 2021-08-08 ENCOUNTER — Telehealth: Payer: Self-pay

## 2021-08-08 NOTE — Telephone Encounter (Addendum)
-----   Message from Talmo Bing, MD sent at 08/07/2021  8:28 AM EDT ----- Can you let her know that she has BV and that I've sent in flagyl.   Called pt with [facility] Interpreter # [phone] left message that that we have sent in a [facility] [address] to please give the office a call back.   Leonette Nutting  08/08/21

## 2021-08-12 NOTE — Telephone Encounter (Signed)
Called pt with [employer] interpreter ID - [email or web address]. Pt did not answer her mobile or home telephone numbers. Unable to leave message - will need to attempt to reach pt again. Verification received by calling [facility] that pt has not obtained Metronidazole yet.

## 2021-08-14 ENCOUNTER — Inpatient Hospital Stay: Payer: 59 | Admitting: Family Medicine

## 2021-08-14 NOTE — Telephone Encounter (Signed)
Call placed to pt with [facility] Interpreter : [place] # [phone]. Call placed to pt's number, not valid number. Call placed to husband's number and left message for pt to return call to office.   Judeth Cornfield, RN

## 2021-09-03 ENCOUNTER — Other Ambulatory Visit: Payer: Self-pay

## 2021-09-03 ENCOUNTER — Ambulatory Visit (INDEPENDENT_AMBULATORY_CARE_PROVIDER_SITE_OTHER): Payer: 59 | Admitting: Obstetrics and Gynecology

## 2021-09-03 ENCOUNTER — Encounter: Payer: Self-pay | Admitting: Obstetrics and Gynecology

## 2021-09-03 VITALS — BP 124/85 | HR 92 | Wt 104.2 lb

## 2021-09-03 DIAGNOSIS — B3731 Acute candidiasis of vulva and vagina: Secondary | ICD-10-CM | POA: Diagnosis not present

## 2021-09-03 DIAGNOSIS — Z789 Other specified health status: Secondary | ICD-10-CM | POA: Diagnosis not present

## 2021-09-03 MED ORDER — FLUCONAZOLE 150 MG PO TABS: 150.0000 mg | ORAL_TABLET | ORAL | 0 refills | Status: DC

## 2021-09-03 NOTE — Progress Notes (Signed)
Obstetrics and Gynecology Visit Return Patient Evaluation  Appointment Date: 09/03/2021  Primary Care Provider: Pcp, No  OBGYN Clinic: Center for [facility]  Chief Complaint: follow up pelvic exam  History of Present Illness:  Kristin Clay is a 35 y.o. s/p 8/7 low forceps delivery over 3c with medio-lateral episotomy. Pt seen on 9/12 for PP exam and episotomy not healed all the way yet.   Interval History: Since that time, she states that she is doing well and feels well except for vaginal itching. No sex yet  Review of Systems: as noted in the History of Present Illness.   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 General appearance: Well nourished, well developed female in no acute distress.  Neuro/Psych:  Normal mood and affect.    Pelvic exam:  EGBUS: completely healed but she does have a moderate to severe yeast infection with dryness and scaling on bilateral labia majora to the rectum and mons with white cottage cheese like d/c at the introitus.    Assessment: pt healed  Plan:  1. Vulvovaginal candidiasis No longer breastfeeding. Diflucan x 3 sent in. She states her CBGs weren't doing well until recently. I told her importance of good CBG control to help control vaginitis in the future.   2. Language barrier Interpreter used  3. GYN Patient declines birth control. Pap neg 2022  4. Primary care Patient confirms her PCP appointment in two days and importance of keeping this visit stressed with here.    RTC: PRN  Cornelia Copa MD Attending Center for [employer] [facility])

## 2021-09-05 ENCOUNTER — Other Ambulatory Visit: Payer: Self-pay

## 2021-09-05 ENCOUNTER — Ambulatory Visit: Payer: Self-pay | Admitting: *Deleted

## 2021-09-05 ENCOUNTER — Ambulatory Visit (INDEPENDENT_AMBULATORY_CARE_PROVIDER_SITE_OTHER): Payer: 59 | Admitting: Family Medicine

## 2021-09-05 ENCOUNTER — Encounter: Payer: Self-pay | Admitting: Family Medicine

## 2021-09-05 VITALS — BP 142/94 | HR 84 | Temp 98.2°F | Resp 16 | Ht 62.0 in | Wt 104.2 lb

## 2021-09-05 DIAGNOSIS — Z7689 Persons encountering health services in other specified circumstances: Secondary | ICD-10-CM

## 2021-09-05 DIAGNOSIS — H539 Unspecified visual disturbance: Secondary | ICD-10-CM | POA: Diagnosis not present

## 2021-09-05 DIAGNOSIS — E11649 Type 2 diabetes mellitus with hypoglycemia without coma: Secondary | ICD-10-CM | POA: Diagnosis not present

## 2021-09-05 DIAGNOSIS — M25512 Pain in left shoulder: Secondary | ICD-10-CM

## 2021-09-05 DIAGNOSIS — M25511 Pain in right shoulder: Secondary | ICD-10-CM

## 2021-09-05 DIAGNOSIS — Z789 Other specified health status: Secondary | ICD-10-CM

## 2021-09-05 DIAGNOSIS — G8929 Other chronic pain: Secondary | ICD-10-CM

## 2021-09-05 LAB — POCT GLYCOSYLATED HEMOGLOBIN (HGB A1C): Hemoglobin A1C: 12.6 % — AB (ref 4.0–5.6)

## 2021-09-05 MED ORDER — NIFEDIPINE ER 90 MG PO TB24: 90.0000 mg | ORAL_TABLET | Freq: Every day | ORAL | 1 refills | Status: DC

## 2021-09-05 MED ORDER — HUMULIN 70/30 KWIKPEN (70-30) 100 UNIT/ML ~~LOC~~ SUPN: 12.0000 [IU] | PEN_INJECTOR | Freq: Two times a day (BID) | SUBCUTANEOUS | 3 refills | Status: DC

## 2021-09-05 MED ORDER — IBUPROFEN 600 MG PO TABS: 600.0000 mg | ORAL_TABLET | Freq: Four times a day (QID) | ORAL | 1 refills | Status: DC

## 2021-09-05 MED ORDER — CYCLOBENZAPRINE HCL 5 MG PO TABS: 5.0000 mg | ORAL_TABLET | Freq: Every day | ORAL | 1 refills | Status: DC

## 2021-09-05 NOTE — Progress Notes (Signed)
Patient is concern about her pain in  the back and that she is also having trouble seeing well.

## 2021-09-05 NOTE — Progress Notes (Signed)
New Patient Office Visit  Subjective:  Patient ID: Kristin Clay, female    DOB: 1985-11-30  Age: 35 y.o. MRN: [id number]  CC:  Chief Complaint  Patient presents with   Establish Care   Diabetes   Hypertension    HPI [name] presents for to establish care and for review of chronic medical issues including diabetes.  Patient also complains of some visual changes intermittently as well as back pain.  She reports that she has the back pain intermittently since having had her last child.  She utilizes ibuprofen and Flexeril for her symptoms.  She would like a refill.  This office visit was aided by an interpreter.  Past Medical History:  Diagnosis Date   Chronic hypertension    Chronic hypertension with superimposed severe preeclampsia 05/24/9020   Complication of anesthesia    with hand surgery, local anes did not work, tried twice- had to put her to sleep   Diabetes mellitus type 1 ([place])    DKA (diabetic ketoacidosis) ([place]) 10/2020   Epidural hematoma 07/04/2021   GBS bacteriuria 06/09/2021   History of diabetic ketoacidosis 05/01/2021   Infection    UTI   Obstetric vaginal laceration with type 3c third degree perineal laceration 06/29/2021   Poor fetal growth complicating pregnancy, antepartum, third trimester, other fetus 06/19/2021   Pyelonephritis 10/2020   Rh negative status during pregnancy 05/01/2021   s/p Rhogam 05/13/21   Type 1 diabetes mellitus with ketoacidosis, uncontrolled ([place]) 02/22/2020   Type I diabetes mellitus ([place])    dx 32yr ago    Past Surgical History:  Procedure Laterality Date   female circumcision Bilateral    clitorectomy as well   FOOT SURGERY  2018   HAND SURGERY  2019    Family History  Problem Relation Age of Onset   Hyperlipidemia Mother    Hypertension Mother    Kidney disease Father    Alzheimer's disease Father     Social History   Socioeconomic History   Marital status: Married    Spouse name: Not on file    Number of children: Not on file   Years of education: Not on file   Highest education level: Not on file  Occupational History   Not on file  Tobacco Use   Smoking status: Never   Smokeless tobacco: Never  Vaping Use   Vaping Use: Never used  Substance and Sexual Activity   Alcohol use: Never   Drug use: Never   Sexual activity: Yes    Birth control/protection: None  Other Topics Concern   Not on file  Social History Narrative   ** Merged History Encounter **       ** Merged History Encounter **       Social Determinants of Health   Financial Resource Strain: High Risk   Difficulty of Paying Living Expenses: Very hard  Food Insecurity: No Food Insecurity   Worried About RCharity fundraiserin the Last Year: Never true   RArboriculturistin the Last Year: Never true  Transportation Needs: No Transportation Needs   Lack of Transportation (Medical): No   Lack of Transportation (Non-Medical): No  Physical Activity: Not on file  Stress: Not on file  Social Connections: Not on file  Intimate Partner Violence: Not on file    ROS Review of Systems  Eyes:  Positive for visual disturbance.  Musculoskeletal:  Positive for back pain.  All other systems reviewed  and are negative.  Objective:   Today's Vitals: BP (!) 142/94   Pulse 84   Temp 98.2 F (36.8 C) (Oral)   Resp 16   Ht '5\' 2"'  (1.575 m)   Wt 104 lb 3.2 oz (47.3 kg)   SpO2 97%   Breastfeeding No   BMI 19.06 kg/m   Physical Exam Vitals and nursing note reviewed.  Constitutional:      General: She is not in acute distress. Cardiovascular:     Rate and Rhythm: Normal rate and regular rhythm.  Pulmonary:     Effort: Pulmonary effort is normal.     Breath sounds: Normal breath sounds.  Abdominal:     Palpations: Abdomen is soft.     Tenderness: There is no abdominal tenderness.  Musculoskeletal:        General: No tenderness. Normal range of motion.     Right lower leg: No edema.     Left lower leg: No  edema.  Neurological:     General: No focal deficit present.     Mental Status: She is alert and oriented to person, place, and time.    Assessment & Plan:   1. Uncontrolled type 2 diabetes mellitus with hypoglycemia without coma (HCC) Hemoglobin A1c is elevated well above goal.  Will increase insulin from 8 units twice daily to 12 units twice daily.  Referral to [facility] for further diabetic med management.  Will monitor. - POCT glycosylated hemoglobin (Hb A1C)  2. Visual disturbance Most likely secondary to out-of-control blood sugars.  Patient defers further evaluation until blood sugars are more stabilized.  3. Language barrier   4. Encounter to establish care     Outpatient Encounter Medications as of 09/05/2021  Medication Sig   blood glucose meter kit and supplies Dispense based on patient and insurance preference. Use up to four times daily as directed. (FOR ICD-10 E10.9, E11.9).   ferrous sulfate 325 (65 FE) MG tablet Take 1 tablet (325 mg total) by mouth daily with breakfast.   fluconazole (DIFLUCAN) 150 MG tablet Take 1 tablet (150 mg total) by mouth every 3 (three) days. For three doses   glucose blood test strip Use as instructed QID   Insulin Pen Needle 32G X 4 MM MISC Use as directed   labetalol (NORMODYNE) 100 MG tablet Take 100 mg by mouth 2 (two) times daily.   NIFEdipine (ADALAT CC) 90 MG 24 hr tablet Take 1 tablet (90 mg total) by mouth daily.   OneTouch Delica Lancets 27N MISC 1 Device by Does not apply route in the morning, at noon, in the evening, and at bedtime.   [DISCONTINUED] ibuprofen (ADVIL) 600 MG tablet Take 1 tablet (600 mg total) by mouth every 6 (six) hours. (Patient taking differently: Take 600 mg by mouth every 6 (six) hours as needed for fever, headache or mild pain.)   [DISCONTINUED] insulin isophane & regular human KwikPen (HUMULIN 70/30 KWIKPEN) (70-30) 100 UNIT/ML KwikPen Inject 8 Units into the skin 2 (two) times daily.   [DISCONTINUED]  NIFEdipine (ADALAT CC) 60 MG 24 hr tablet Take 1 tablet (60 mg total) by mouth 2 (two) times daily.   cyclobenzaprine (FLEXERIL) 5 MG tablet Take 1 tablet (5 mg total) by mouth at bedtime.   docusate sodium (COLACE) 100 MG capsule Take 1 capsule (100 mg total) by mouth 2 (two) times daily as needed for mild constipation. (Patient not taking: No sig reported)   ibuprofen (ADVIL) 600 MG tablet Take 1 tablet (600  mg total) by mouth every 6 (six) hours.   insulin isophane & regular human KwikPen (HUMULIN 70/30 KWIKPEN) (70-30) 100 UNIT/ML KwikPen Inject 12 Units into the skin 2 (two) times daily.   Prenatal Vit-Fe Fumarate-FA (PRENATAL VITAMIN) 27-0.8 MG TABS Take 1 tablet by mouth daily. (Patient not taking: No sig reported)   [DISCONTINUED] cyclobenzaprine (FLEXERIL) 5 MG tablet Take 1 tablet (5 mg total) by mouth at bedtime as needed for muscle spasms. (Patient not taking: No sig reported)   [DISCONTINUED] methylPREDNISolone (MEDROL DOSEPAK) 4 MG TBPK tablet Take as directed.   No facility-administered encounter medications on file as of 09/05/2021.    Follow-up: Return in about 3 months (around 12/06/2021) for follow up.   Becky Sax, MD

## 2021-09-05 NOTE — Telephone Encounter (Signed)
135/95, change of vision Postpartum- 2 months  Reason for Disposition . Systolic BP  >= 160 OR Diastolic >= 100  Answer Assessment - Initial Assessment Questions 1. BLOOD PRESSURE: "What is the blood pressure?" "Did you take at least two measurements 5 minutes apart?"     145/96,135/95 2. ONSET: "When did you take your blood pressure?"     At office reading, 11:45 at home 3. HOW: "How did you obtain the blood pressure?" (e.g., visiting nurse, automatic home BP monitor)     Virtual call with nurse- patient using automatic cuff 4. HISTORY: "Do you have a history of high blood pressure?"     Yes- was seen at office today and had medications adjusted 5. MEDICATIONS: "Are you taking any medications for blood pressure?" "Have you missed any doses recently?"     Yes- patient was seen today and BP medication was increased BP medication dosing 6. OTHER SYMPTOMS: "Do you have any symptoms?" (e.g., headache, chest pain, blurred vision, difficulty breathing, weakness)     Blurred vision which patient states she discussed with provider 7. PREGNANCY: "Is there any chance you are pregnant?" "When was your last menstrual period?"     postpartum  Protocols used: Blood Pressure - High-A-AH

## 2021-09-05 NOTE — Telephone Encounter (Signed)
Patient care nurse is calling [facility] number to report patient symptoms- patient was seen at [facility] today for follow up and her BP medication was changed. Patient did state they were notified of the blurred vision. And patient reports no other symptoms associated with elevated BP. Advised Care nurse that follow up or concerns should be addressed with providing care office so patient can be advised appropriately - correct number given so she can follow up with provider at [facility] location.

## 2021-09-10 ENCOUNTER — Telehealth: Payer: Self-pay | Admitting: Pharmacist

## 2021-09-10 ENCOUNTER — Other Ambulatory Visit: Payer: Self-pay

## 2021-09-10 ENCOUNTER — Ambulatory Visit: Payer: 59 | Attending: Family Medicine | Admitting: Pharmacist

## 2021-09-10 DIAGNOSIS — E10649 Type 1 diabetes mellitus with hypoglycemia without coma: Secondary | ICD-10-CM | POA: Diagnosis not present

## 2021-09-10 NOTE — Telephone Encounter (Signed)
Dr. Andrey Campanile,   Thanks for including me in this patient's care. Can you submit an Endo referral?

## 2021-09-10 NOTE — Progress Notes (Signed)
S:    PCP: Dr. Andrey Campanile  Patient arrives in good spirits.  Presents for diabetes evaluation, education, and management. Patient was referred and last seen by Primary Care Provider (Dr. Andrey Campanile) on 09/05/2021. Per PCP visit, Humulin 70/30 dose was increased from 8 units twice daily to 12 units twice daily. Her A1c was collected and resulted 12.6% up from 8.4% (05/01/2021).   Patient reports Type 1-Diabetes was diagnosed over 13 years ago.   Family/Social History:  Family Hx: Hyperlipidemia (mother), Hypertension (mother) Social Hx: Denies tobacco use, smokeless tobacco use, alcohol use, or illicit drug use.   Insurance coverage/medication affordability: [employer]  Medication adherence supoptimal. Patient reports previously taking Humulin 10 units twice daily. She states per last PCP visit Dr. Andrey Campanile increased the dose to 12 units twice daily which she reports she taking for the past 2 days. Patient was able to verbalize taking insulin before meals though reports missing 3 doses of insulin in the last 2 weeks but due to feeling tired.   Current diabetes medications include: Humulin KwikPen 70/30 - 12 units BID Current hypertension medications include: nifedipine 24-hr 90 mg daily, labetalol 100 mg daily Current hyperlipidemia medications include: none  Patient reports hypoglycemic events about 2-3 times a week. Treats successfully.   Patient reported dietary habits: Eats 2 meals/day. Denies other excessive sugar intake. Breakfast: noodles, eggs, salad Dinner: meat, bread, fruit, milk and juice  Patient-reported exercise habits: none   Patient denies nocturia (nighttime urination).  Patient denies neuropathy (nerve pain). Patient denies visual changes. Patient denies self foot exams.     O:  POCT: 384   Lab Results  Component Value Date   HGBA1C 12.6 (A) 09/05/2021   Lipid Panel     Component Value Date/Time   CHOL 179 10/29/2019 0243   TRIG 77 10/29/2019 0243    HDL 35 (L) 10/29/2019 0243   CHOLHDL 5.1 10/29/2019 0243   VLDL 15 10/29/2019 0243   LDLCALC 129 (H) 10/29/2019 0243   Home fasting blood sugars: No meter today, reports an at home range of 250-300, with the lowest blood sugar level of 79. She reports feeling hypoglycemic at that time. Patient denies monitoring blood sugar levels daily, reports checking twice daily every 3 days.   Clinical Atherosclerotic Cardiovascular Disease (ASCVD): No  The ASCVD Risk score (Arnett DK, et al., 2019) failed to calculate for the following reasons:   The 2019 ASCVD risk score is only valid for ages 64 to 101    A/P: Diabetes Goals: FBG 80-130 mg/dL, 2H-PPBG < 967 mg/dL, E9F < 7%. Diabetes longstanding currently uncontrolled. Patient is able to verbalize appropriate hypoglycemia management plan. Hypoglycemia likely due to prolonged periods between meals. Medication adherence appears suboptimal. Patient reports missing 3 doses of insulin in the last 2 weeks but due to feeling tired.   - No medication changes today - Will contact Dr. Andrey Campanile for referral for Endocrinology - Counseled to consume 3 meals a day to avoid hypoglycemic events - Counseled to monitor blood sugar levels 3 times a day and bring meter to next visit - Counseled on diabetic diet - limiting carb, and sugar intake - Extensively discussed pathophysiology of diabetes, recommended lifestyle interventions, dietary effects on blood sugar control - Counseled on s/sx of and management of hypoglycemia - Next A1C anticipated 11/2021.   Follow up with Pharmacy on 10/08/2021 Follow up with Dr. Andrey Campanile on 12/08/2021  Written patient instructions provided.  Total time in face to face counseling 40  minutes.    Patient seen by:  Johnston Ebbs, Student Pharmacist [employer] of [phone]

## 2021-09-16 ENCOUNTER — Other Ambulatory Visit: Payer: Self-pay | Admitting: Family Medicine

## 2021-09-16 DIAGNOSIS — E11649 Type 2 diabetes mellitus with hypoglycemia without coma: Secondary | ICD-10-CM

## 2021-10-07 NOTE — Progress Notes (Signed)
S:     Patient arrives in good spirits.  Presents for diabetes evaluation, education, and management. Patient was referred and last seen by Primary Care Provider (Dr. Andrey Campanile) on 09/05/2021. At last visit with CPP on 09/10/2021, patient reported hypoglycemia.  Patient reports Diabetes is longstanding.  Family/Social History:  -Never smoker  Insurance coverage/medication affordability: [employer]  Medication adherence reported. Patient states twice that only one dose has been missed in the last month. Current diabetes medications include:  -Humulin 70/30 12 units BID  Current hypertension medications include:  -Nifedipine 90 mg QD -Labetalol 100 mg BID Current hyperlipidemia medications include:  -None  Patient is planning on becoming pregnant again soon.  Patient reports occasional hypoglycemic symptoms around 1730 after dinner, however she reports that when she checks it, POC BG is in the 200-300s. This may be due to confusion between symptoms of hypo and hyperglycemia.   Patient reported dietary habits: Eats 1-2 meals/day Patient reports eating pasta, veggies,and meat primarily Patient was counseled on the plate method and eating consistent meals.  Patient-reported exercise habits: none   Patient denies nocturia (nighttime urination).  Patient reports neuropathy (nerve pain). Patient reports visual changes. Patient reports self foot exams.   Patient was educated on microvascular and macrovascular complications of diabetes. Reports neuropathy regularly and may benefit from gabapentin in the future.  O:    Lab Results  Component Value Date   HGBA1C 12.6 (A) 09/05/2021   There were no vitals filed for this visit.  Lipid Panel     Component Value Date/Time   CHOL 179 10/29/2019 0243   TRIG 77 10/29/2019 0243   HDL 35 (L) 10/29/2019 0243   CHOLHDL 5.1 10/29/2019 0243   VLDL 15 10/29/2019 0243   LDLCALC 129 (H) 10/29/2019 0243   Patient reports  checking blood glucose only after breakfast and after dinner with no fasting checks. Those numbers did not have times associated, but were as follows: 327, 319, 288, 357. 297, 378, 264  Clinical Atherosclerotic Cardiovascular Disease (ASCVD): No  The ASCVD Risk score (Arnett DK, et al., 2019) failed to calculate for the following reasons:   The 2019 ASCVD risk score is only valid for ages 65 to 101   A/P: Diabetes longstanding currently uncontrolled. Patient is able to verbalize appropriate hypoglycemia management plan. Medication adherence appears adequate. Control is suboptimal due to inadequate insulin dosing. -Stop Novolin 70/30. Patient voiced understanding. -Start Lantus 20 units daily to provide more consistent basal insulin delivery -Start Humalog 5 units before each meal. Hopefully this will provide more targeted insulin delivery around erratic meals and allow for easier titration. -Extensively discussed pathophysiology of diabetes, recommended lifestyle interventions, dietary effects on blood sugar control -Counseled on s/sx of and management of hypoglycemia -Next A1C anticipated 12/06/2020.   ASCVD risk - primary prevention in patient with diabetes. Last LDL  has not been measured recently . ASCVD risk score  can not be calculated  - moderate intensity statin indicated. -Patient would benefit from statin therapy, however is planning on becoming pregnant again soon.  -Obtain lipid panel at next follow- up in 2 weeks  Hypertension not discussed this visit. -Check UACR today  Written patient instructions provided.  Total time in face to face counseling 36  minutes.   Follow up Pharmacist in clinic in 2 weeks to titrate insulin therapy. Follow up with Dr. Andrey Campanile on 12/08/2020  Thank you for allowing pharmacy to participate in this patient's care.  Enos Fling, PharmD PGY1  Pharmacy Resident 10/08/2021 11:10 AM

## 2021-10-08 ENCOUNTER — Other Ambulatory Visit: Payer: Self-pay

## 2021-10-08 ENCOUNTER — Ambulatory Visit: Payer: 59 | Attending: Family Medicine | Admitting: Pharmacist

## 2021-10-08 DIAGNOSIS — E10649 Type 1 diabetes mellitus with hypoglycemia without coma: Secondary | ICD-10-CM | POA: Diagnosis not present

## 2021-10-08 MED ORDER — INSULIN LISPRO (1 UNIT DIAL) 100 UNIT/ML (KWIKPEN)
5.0000 [IU] | PEN_INJECTOR | Freq: Two times a day (BID) | SUBCUTANEOUS | 2 refills | Status: DC
  Filled 2021-10-08: qty 9, 90d supply, fill #0

## 2021-10-08 MED ORDER — BASAGLAR KWIKPEN 100 UNIT/ML ~~LOC~~ SOPN
20.0000 [IU] | PEN_INJECTOR | Freq: Every day | SUBCUTANEOUS | 2 refills | Status: DC
  Filled 2021-10-08: qty 15, 75d supply, fill #0

## 2021-10-08 MED ORDER — LANTUS SOLOSTAR 100 UNIT/ML ~~LOC~~ SOPN
20.0000 [IU] | PEN_INJECTOR | Freq: Every day | SUBCUTANEOUS | 99 refills | Status: DC
Start: 2021-10-08 — End: 2021-10-24
  Filled 2021-10-08: qty 15, 75d supply, fill #0

## 2021-10-08 MED ORDER — TECHLITE PEN NEEDLES 32G X 4 MM MISC
2 refills | Status: DC
  Filled 2021-10-08: qty 100, 25d supply, fill #0

## 2021-10-24 ENCOUNTER — Ambulatory Visit: Payer: 59 | Attending: Family Medicine | Admitting: Pharmacist

## 2021-10-24 ENCOUNTER — Other Ambulatory Visit: Payer: Self-pay

## 2021-10-24 DIAGNOSIS — E10649 Type 1 diabetes mellitus with hypoglycemia without coma: Secondary | ICD-10-CM

## 2021-10-24 MED ORDER — LANTUS SOLOSTAR 100 UNIT/ML ~~LOC~~ SOPN
24.0000 [IU] | PEN_INJECTOR | Freq: Every day | SUBCUTANEOUS | 3 refills | Status: DC
Start: 2021-10-24 — End: 2022-01-07
  Filled 2021-10-24 – 2021-12-29 (×3): qty 15, 62d supply, fill #0

## 2021-10-24 MED ORDER — INSULIN LISPRO (1 UNIT DIAL) 100 UNIT/ML (KWIKPEN)
6.0000 [IU] | PEN_INJECTOR | Freq: Two times a day (BID) | SUBCUTANEOUS | 2 refills | Status: DC
  Filled 2021-10-24: qty 15, 125d supply, fill #0
  Filled 2021-11-14: qty 9, 75d supply, fill #0

## 2021-10-24 NOTE — Progress Notes (Signed)
    S:     Patient arrives in good spirits.  Presents for diabetes evaluation, education, and management. Patient was referred and last seen by Primary Care Provider (Dr. Andrey Campanile) on 09/05/2021. At last visit with CPP on 10/08/2021, patient's insulin was changed to Lantus + Humalog.  Patient reports Diabetes is longstanding. Since her last PCP visit, pt reports much better home CBG control. She is feeling better and is very happy with her results using the new insulin regimen.   Family/Social History:  -Never smoker  Insurance coverage/medication affordability: [employer]  Medication adherence reported. She has missed no insulin doses since being seen last month. Current diabetes medications include:  -Lantus 20 units daily.  -Humalog 5 units BID before meals (pt will sometimes take 6 units BID).   Patient denies hypoglycemia.  Patient reported dietary habits: Eats 1-2 meals/day Patient reports eating pasta, veggies,and meat primarily Patient was counseled on the plate method and eating consistent meals.  Patient-reported exercise habits: none   Patient denies nocturia (nighttime urination).  Patient reports neuropathy (nerve pain). Patient reports visual changes. Patient reports self foot exams.   O:  Lab Results  Component Value Date   HGBA1C 12.6 (A) 09/05/2021   There were no vitals filed for this visit.  Lipid Panel     Component Value Date/Time   CHOL 179 10/29/2019 0243   TRIG 77 10/29/2019 0243   HDL 35 (L) 10/29/2019 0243   CHOLHDL 5.1 10/29/2019 0243   VLDL 15 10/29/2019 0243   LDLCALC 129 (H) 10/29/2019 0243   No meter with her today. Patient reports checking blood glucose only after breakfast and after dinner with no fasting checks. She reports the following range: 190s - 240s  Clinical Atherosclerotic Cardiovascular Disease (ASCVD): No  The ASCVD Risk score (Arnett DK, et al., 2019) failed to calculate for the following reasons:   The 2019 ASCVD  risk score is only valid for ages 71 to 33   A/P: Diabetes longstanding currently uncontrolled. Patient is able to verbalize appropriate hypoglycemia management plan. Medication adherence appears adequate. -Increase Lantus to 24 units daily.  -Increase Humalog to 8 units before each meal.  -Extensively discussed pathophysiology of diabetes, recommended lifestyle interventions, dietary effects on blood sugar control -Counseled on s/sx of and management of hypoglycemia -Next A1C anticipated 12/06/2020.   Written patient instructions provided.  Total time in face to face counseling 36  minutes.  Follow up Pharmacist in clinic in 2-3 weeks to titrate insulin therapy. Follow up with Dr. Andrey Campanile on 12/08/2020  Thank you for allowing pharmacy to participate in this patient's care.  Butch Penny, PharmD, Patsy [place], CPP Clinical Pharmacist [facility] & [facility] [phone]

## 2021-11-14 ENCOUNTER — Ambulatory Visit: Payer: 59 | Attending: Family Medicine | Admitting: Pharmacist

## 2021-11-14 ENCOUNTER — Other Ambulatory Visit: Payer: Self-pay

## 2021-11-14 ENCOUNTER — Encounter: Payer: Self-pay | Admitting: Pharmacist

## 2021-11-14 DIAGNOSIS — E10649 Type 1 diabetes mellitus with hypoglycemia without coma: Secondary | ICD-10-CM | POA: Diagnosis not present

## 2021-11-14 MED ORDER — INSULIN LISPRO (1 UNIT DIAL) 100 UNIT/ML (KWIKPEN)
8.0000 [IU] | PEN_INJECTOR | Freq: Two times a day (BID) | SUBCUTANEOUS | 2 refills | Status: DC
  Filled 2021-11-14: qty 15, 94d supply, fill #0

## 2021-11-14 NOTE — Progress Notes (Signed)
° ° °  S:     Patient arrives in good spirits.  Presents for diabetes evaluation, education, and management. Patient was referred and last seen by Primary Care Provider (Dr. Andrey Campanile) on 09/05/2021. At last visit with CPP on 10/24/2021, we adjusted her insulin.  Patient reports Diabetes is longstanding. Since her last CPP visit, pt reports much better home CBG control. She is feeling better and is very happy with her results using the new insulin regimen.   Family/Social History:  -Never smoker  Insurance coverage/medication affordability: [employer]  Medication adherence reported. She has missed no insulin doses since being seen last month. Current diabetes medications include:  -Lantus 24 units daily.  -Humalog 8 units before meals  Patient denies hypoglycemia.  Patient reported dietary habits: Eats 1-2 meals/day Patient reports eating pasta, veggies, and meat primarily. Patient was counseled on the plate method and eating consistent meals.  Patient-reported exercise habits: none   Patient denies nocturia (nighttime urination).  Patient reports neuropathy (nerve pain). Patient reports visual changes. Patient reports self foot exams.   O:  Lab Results  Component Value Date   HGBA1C 12.6 (A) 09/05/2021   There were no vitals filed for this visit.  Lipid Panel     Component Value Date/Time   CHOL 179 10/29/2019 0243   TRIG 77 10/29/2019 0243   HDL 35 (L) 10/29/2019 0243   CHOLHDL 5.1 10/29/2019 0243   VLDL 15 10/29/2019 0243   LDLCALC 129 (H) 10/29/2019 0243   No meter with her today. Patient reports checking blood glucose only after breakfast and after dinner with no fasting checks. She reports the following range: 78 - 200. Tells me that most of her levels are in the low 100s-140s since her last visit with me.   Clinical Atherosclerotic Cardiovascular Disease (ASCVD): No  The ASCVD Risk score (Arnett DK, et al., 2019) failed to calculate for the following  reasons:   The 2019 ASCVD risk score is only valid for ages 50 to 66   A/P: Diabetes longstanding currently uncontrolled. Patient is able to verbalize appropriate hypoglycemia management plan. Medication adherence appears adequate. -Continue Lantus 24 units daily.  -Continue Humalog 8 units before each meal.  -Extensively discussed pathophysiology of diabetes, recommended lifestyle interventions, dietary effects on blood sugar control -Counseled on s/sx of and management of hypoglycemia -Next A1C anticipated 12/06/2021.   Written patient instructions provided.  Total time in face to face counseling 20  minutes. Follow up with Dr. Andrey Campanile on 12/08/2020  Thank you for allowing pharmacy to participate in this patient's care.  Butch Penny, PharmD, Patsy [place], CPP Clinical Pharmacist [facility] [phone]

## 2021-11-21 ENCOUNTER — Encounter (HOSPITAL_COMMUNITY): Payer: Self-pay | Admitting: Emergency Medicine

## 2021-11-21 ENCOUNTER — Emergency Department (HOSPITAL_COMMUNITY): Payer: 59

## 2021-11-21 ENCOUNTER — Inpatient Hospital Stay (HOSPITAL_COMMUNITY)
Admission: EM | Admit: 2021-11-21 | Discharge: 2021-11-21 | Disposition: A | Discharge status: Home / Self Care | Payer: 59 | Attending: Family Medicine | Admitting: Family Medicine

## 2021-11-21 ENCOUNTER — Other Ambulatory Visit: Payer: Self-pay

## 2021-11-21 ENCOUNTER — Inpatient Hospital Stay (HOSPITAL_COMMUNITY): Payer: 59

## 2021-11-21 DIAGNOSIS — R109 Unspecified abdominal pain: Secondary | ICD-10-CM | POA: Diagnosis not present

## 2021-11-21 DIAGNOSIS — O09521 Supervision of elderly multigravida, first trimester: Secondary | ICD-10-CM | POA: Diagnosis not present

## 2021-11-21 DIAGNOSIS — M79672 Pain in left foot: Secondary | ICD-10-CM | POA: Diagnosis not present

## 2021-11-21 DIAGNOSIS — Z3491 Encounter for supervision of normal pregnancy, unspecified, first trimester: Secondary | ICD-10-CM

## 2021-11-21 DIAGNOSIS — W010XXA Fall on same level from slipping, tripping and stumbling without subsequent striking against object, initial encounter: Secondary | ICD-10-CM | POA: Diagnosis not present

## 2021-11-21 DIAGNOSIS — S99912A Unspecified injury of left ankle, initial encounter: Secondary | ICD-10-CM | POA: Diagnosis not present

## 2021-11-21 DIAGNOSIS — W19XXXA Unspecified fall, initial encounter: Secondary | ICD-10-CM | POA: Diagnosis not present

## 2021-11-21 DIAGNOSIS — O26891 Other specified pregnancy related conditions, first trimester: Secondary | ICD-10-CM | POA: Diagnosis not present

## 2021-11-21 DIAGNOSIS — B3731 Acute candidiasis of vulva and vagina: Secondary | ICD-10-CM

## 2021-11-21 DIAGNOSIS — Z3A08 8 weeks gestation of pregnancy: Secondary | ICD-10-CM | POA: Diagnosis not present

## 2021-11-21 DIAGNOSIS — O9A211 Injury, poisoning and certain other consequences of external causes complicating pregnancy, first trimester: Secondary | ICD-10-CM

## 2021-11-21 DIAGNOSIS — X501XXA Overexertion from prolonged static or awkward postures, initial encounter: Secondary | ICD-10-CM | POA: Insufficient documentation

## 2021-11-21 DIAGNOSIS — Y92009 Unspecified place in unspecified non-institutional (private) residence as the place of occurrence of the external cause: Secondary | ICD-10-CM

## 2021-11-21 LAB — GC/CHLAMYDIA PROBE AMP (~~LOC~~) NOT AT ARMC
Chlamydia: NEGATIVE
Comment: NEGATIVE
Comment: NORMAL
Neisseria Gonorrhea: NEGATIVE

## 2021-11-21 LAB — CBC
HCT: 40.5 % (ref 36.0–46.0)
Hemoglobin: 13.8 g/dL (ref 12.0–15.0)
MCH: 28.6 pg (ref 26.0–34.0)
MCHC: 34.1 g/dL (ref 30.0–36.0)
MCV: 84 fL (ref 80.0–100.0)
Platelets: 240 10*3/uL (ref 150–400)
RBC: 4.82 MIL/uL (ref 3.87–5.11)
RDW: 13 % (ref 11.5–15.5)
WBC: 5.3 10*3/uL (ref 4.0–10.5)
nRBC: 0 % (ref 0.0–0.2)

## 2021-11-21 LAB — HCG, QUANTITATIVE, PREGNANCY: hCG, Beta Chain, Quant, S: 69260 m[IU]/mL — ABNORMAL HIGH (ref <5)

## 2021-11-21 LAB — WET PREP, GENITAL
Clue Cells Wet Prep HPF POC: NONE SEEN
Sperm: NONE SEEN
Trich, Wet Prep: NONE SEEN
WBC, Wet Prep HPF POC: >=10 — AB (ref <10)

## 2021-11-21 IMAGING — US US OB COMP LESS 14 WK
1 series · 15 of 28 positions shown · non-contrast
Comparison: None.

CLINICAL DATA: Evaluate for viability. LMP: [DATE]
corresponding to an estimated gestational age of 8 weeks 6 days.

EXAM:
OBSTETRIC <14 WK ULTRASOUND
TECHNIQUE: Transabdominal ultrasound was performed for evaluation of the
gestation as well as the maternal uterus and adnexal regions.

[Series 1: us ob comp less 14 wk · 38 acquisitions, 15 frames shown]
[im 1/38]
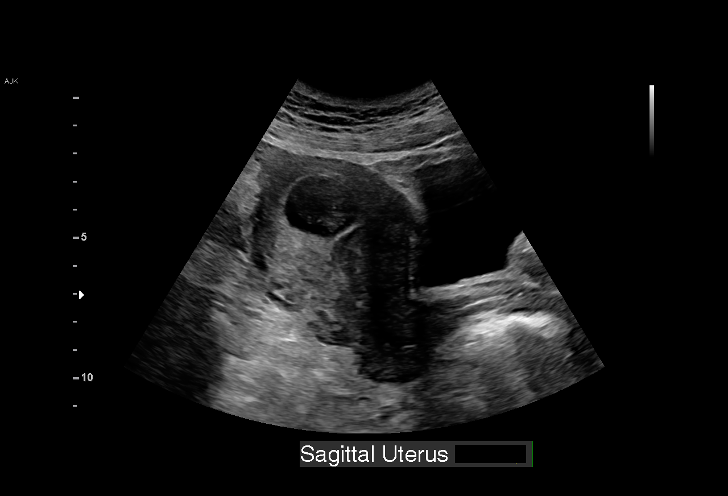
[im 3/38]
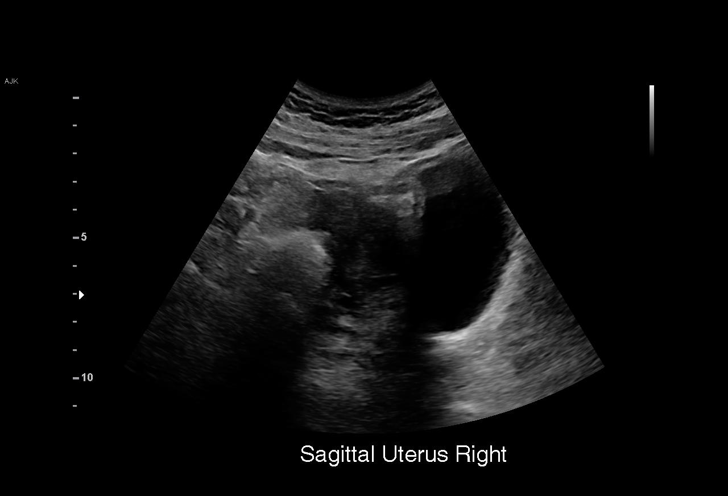
[im 6/38]
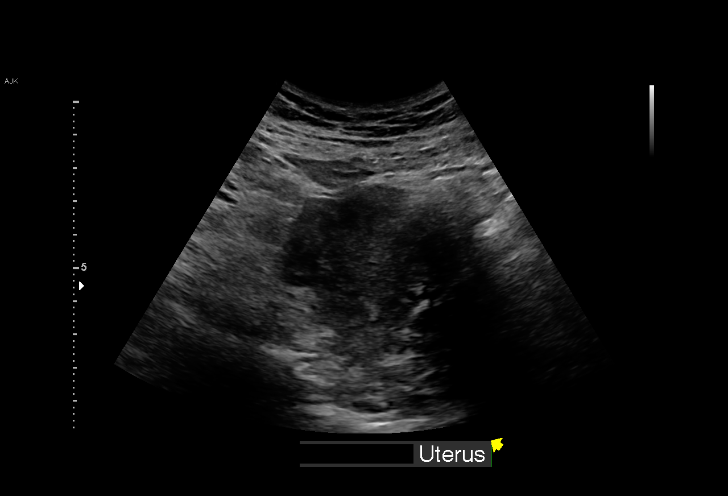
[im 9/38]
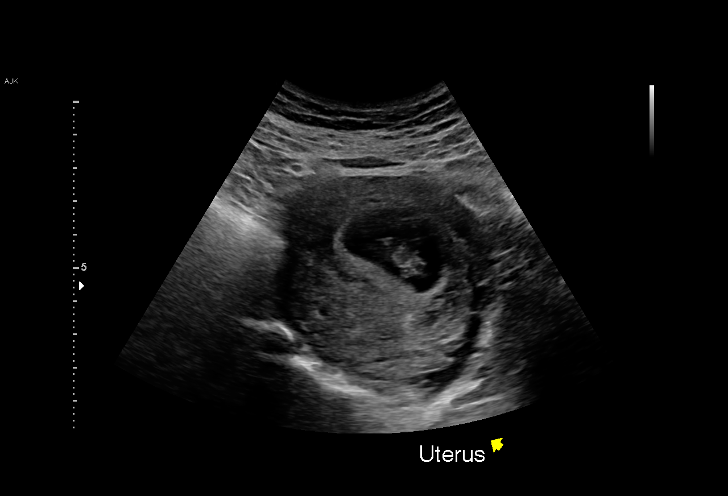
[im 11/38]
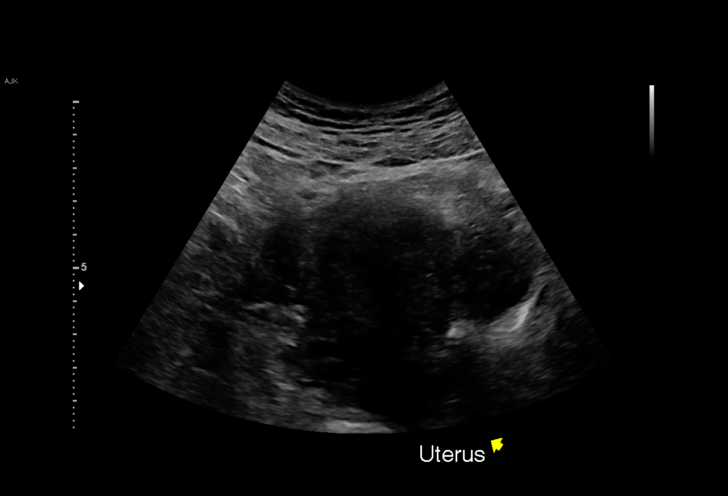
[im 14/38]
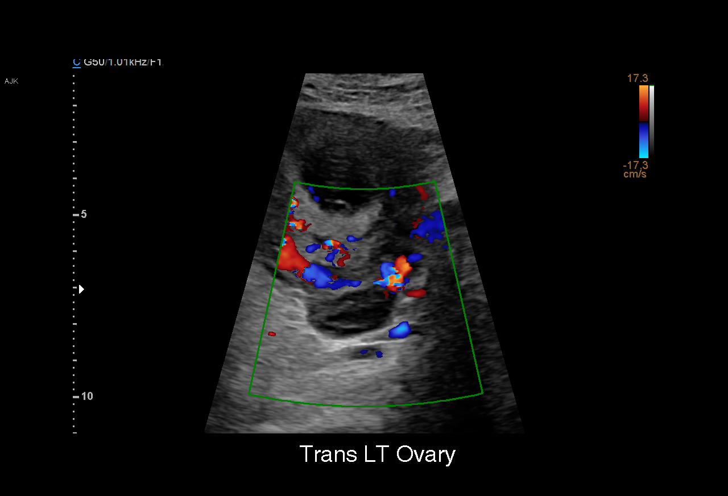
[im 17/38]
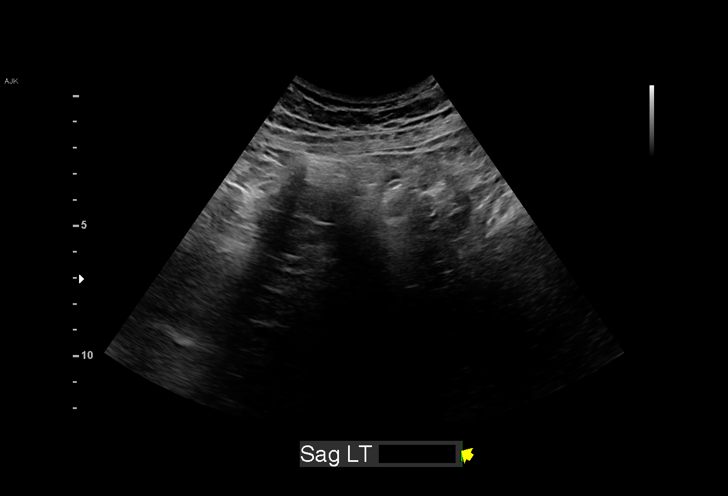
[im 20/38]
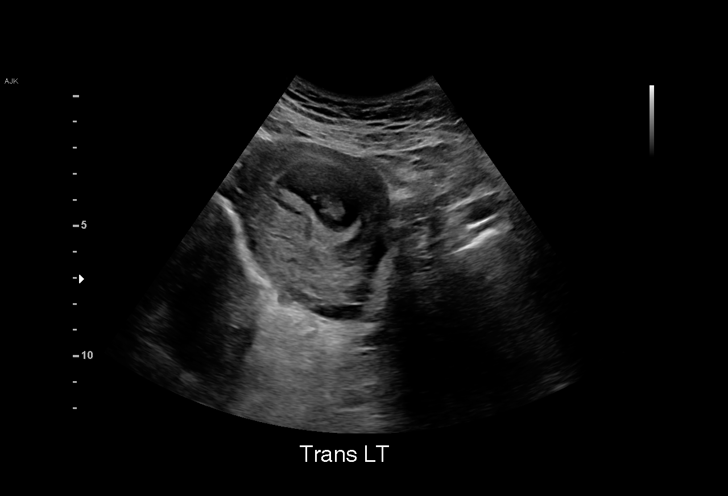
[im 21/38]
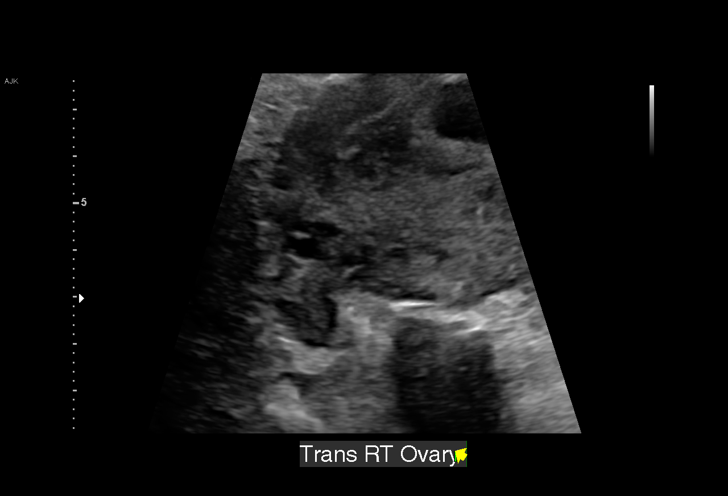
[im 24/38]
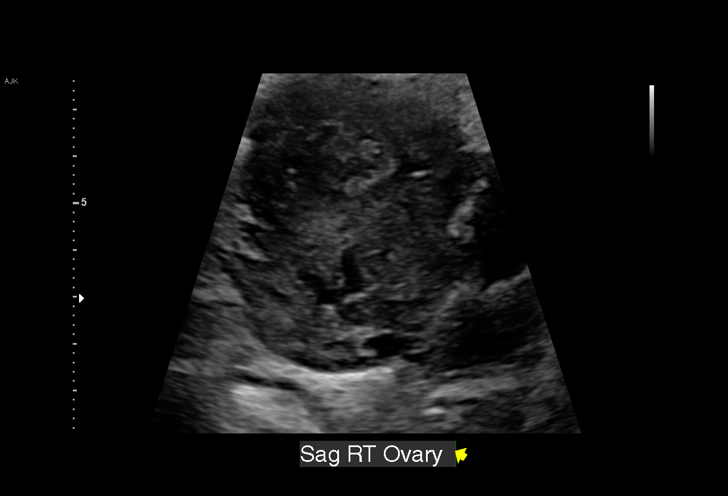
[im 27/38]
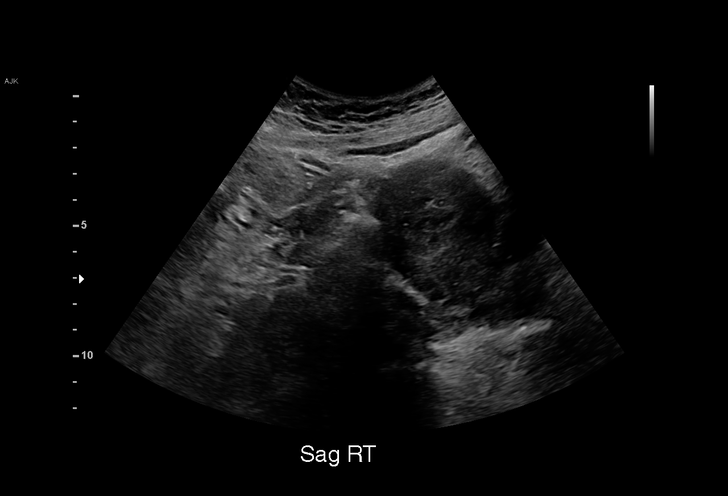
[im 29/38]
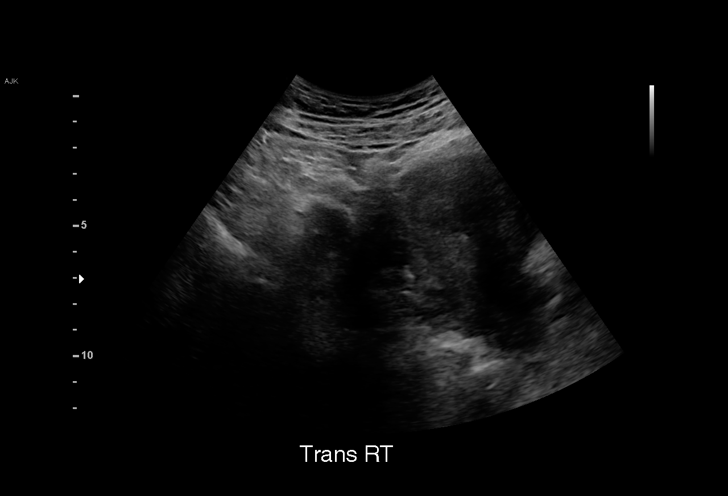
[im 32/38]
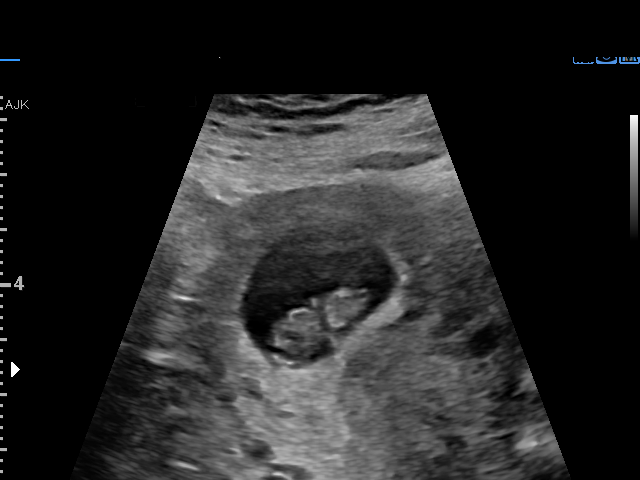
[im 35/38]
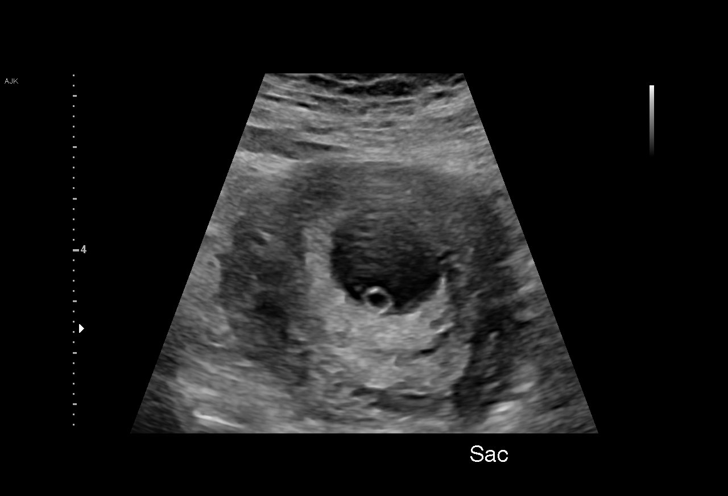
[im 38/38]
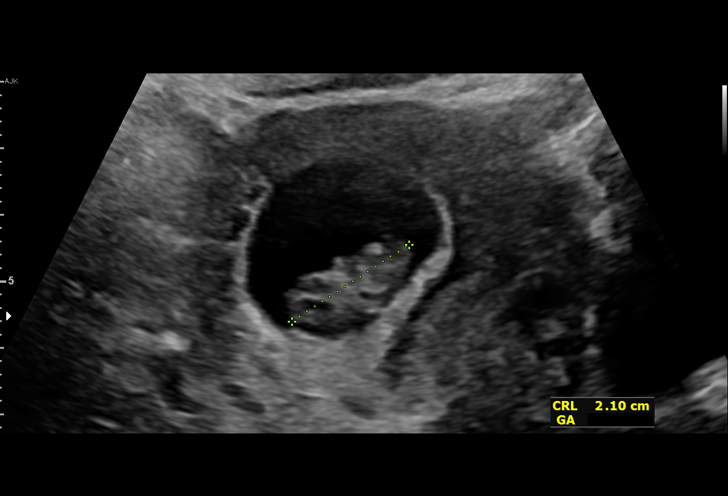

[15 of 28 positions shown; findings below may reference images not displayed]

FINDINGS: Intrauterine gestational sac: Single intrauterine gestational sac.

Yolk sac:  Seen

Embryo:  Present

Cardiac Activity: Detected

Heart Rate: 173 bpm

CRL:   21 mm   8 w 5 d                  US EDC: [DATE]

Subchorionic hemorrhage:  None visualized.

Maternal uterus/adnexae: The maternal ovaries are unremarkable.
There is a corpus luteum in the left ovary.
IMPRESSION: Single live intrauterine pregnancy with an estimated gestational age
of 8 weeks, 5 days concordant with age based on LMP.

## 2021-11-21 IMAGING — CR DG KNEE COMPLETE 4+V*R*
4 series · 4 of 4 positions shown · non-contrast
Comparison: None.

CLINICAL DATA: Fall injury.  Right knee and left ankle pain.

EXAM:
LEFT ANKLE COMPLETE - 3+ VIEW; RIGHT KNEE - COMPLETE 4+ VIEW

[knee ap]
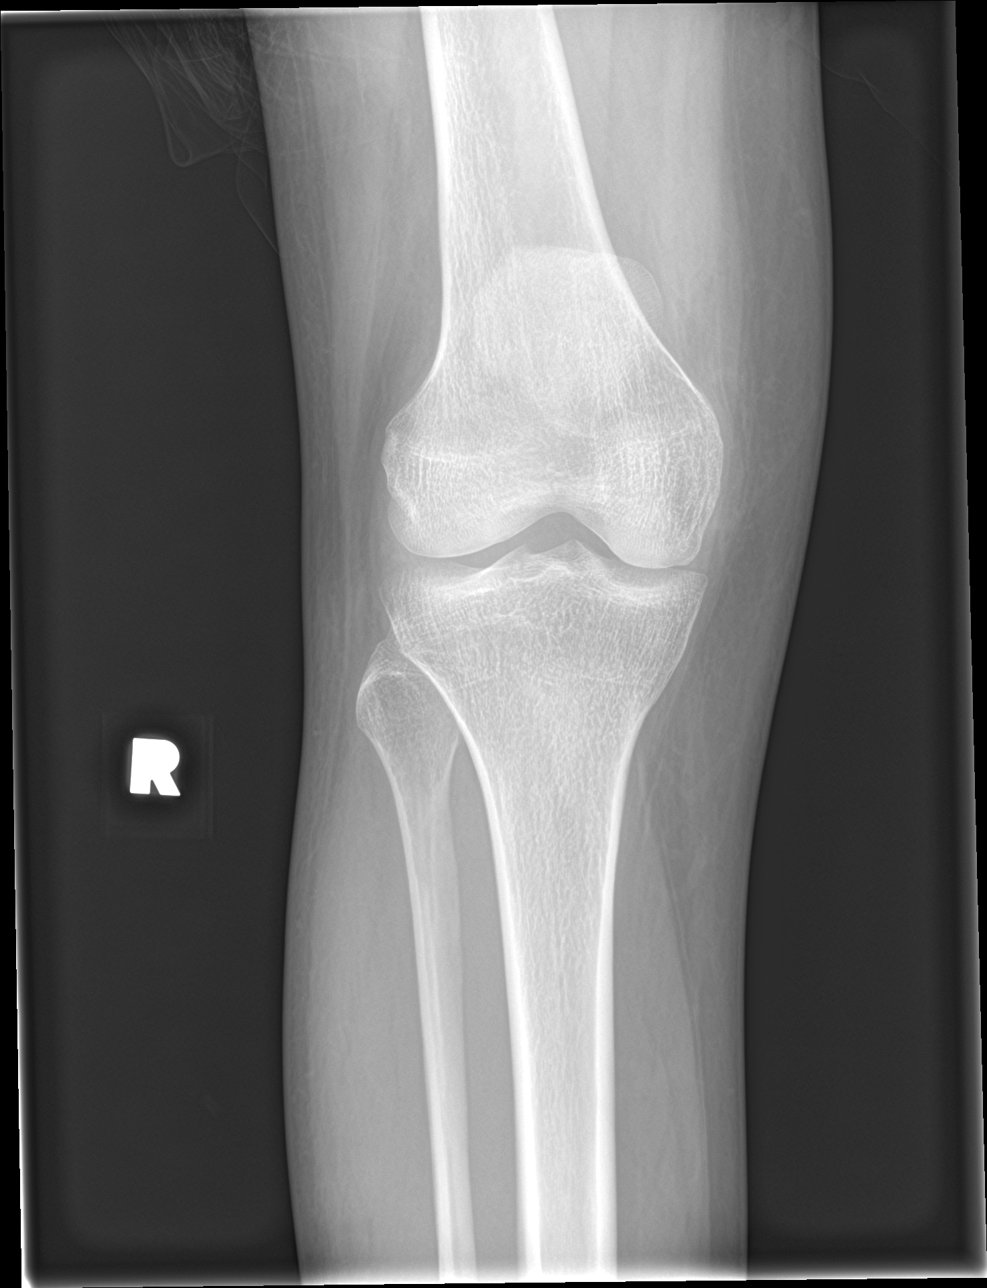

[knee obl (1 of 2)]
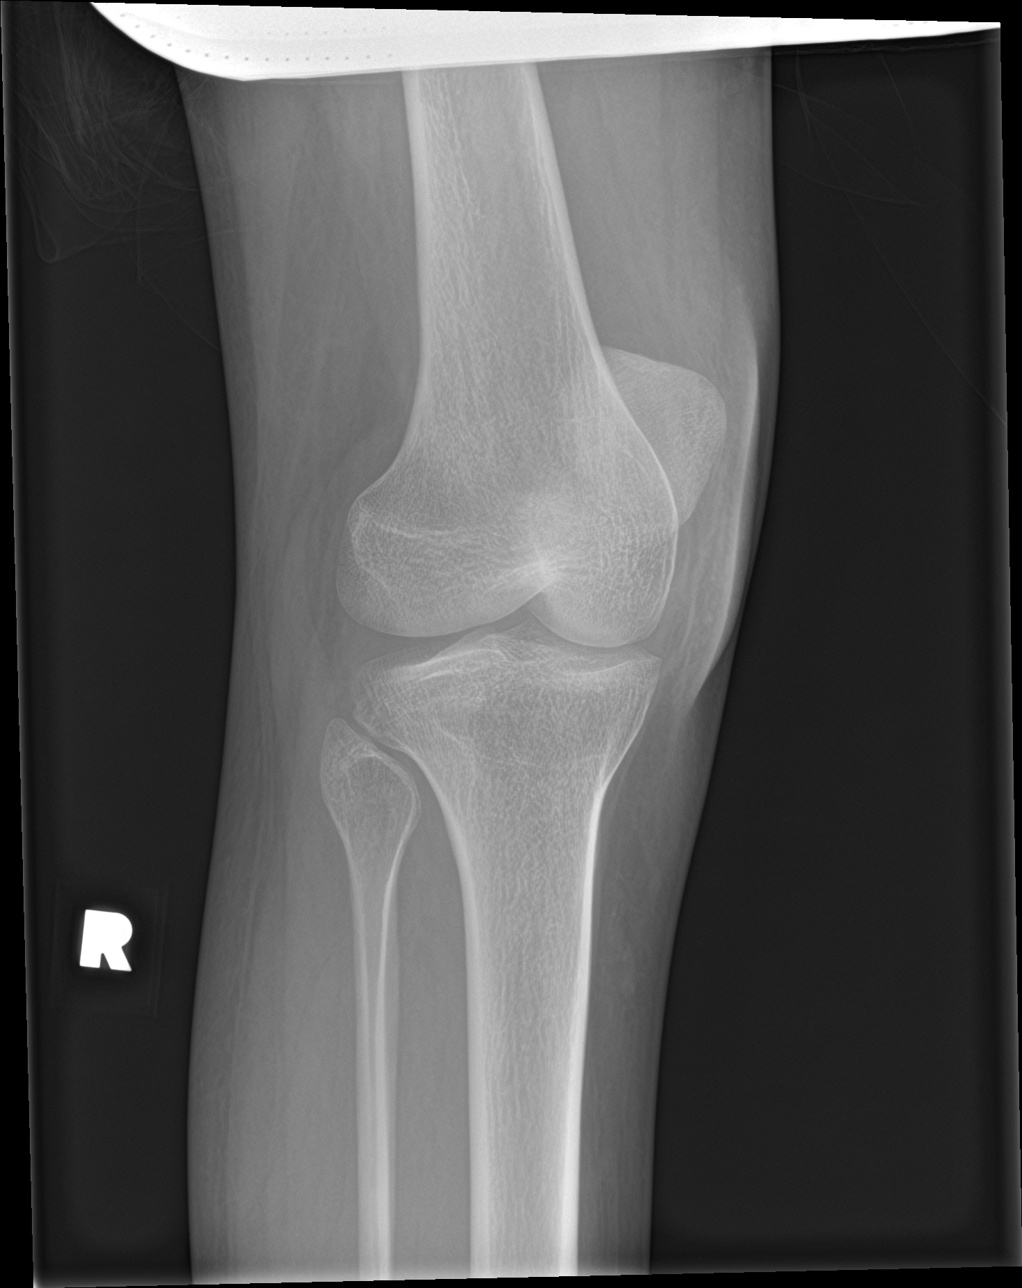

[knee obl (2 of 2)]
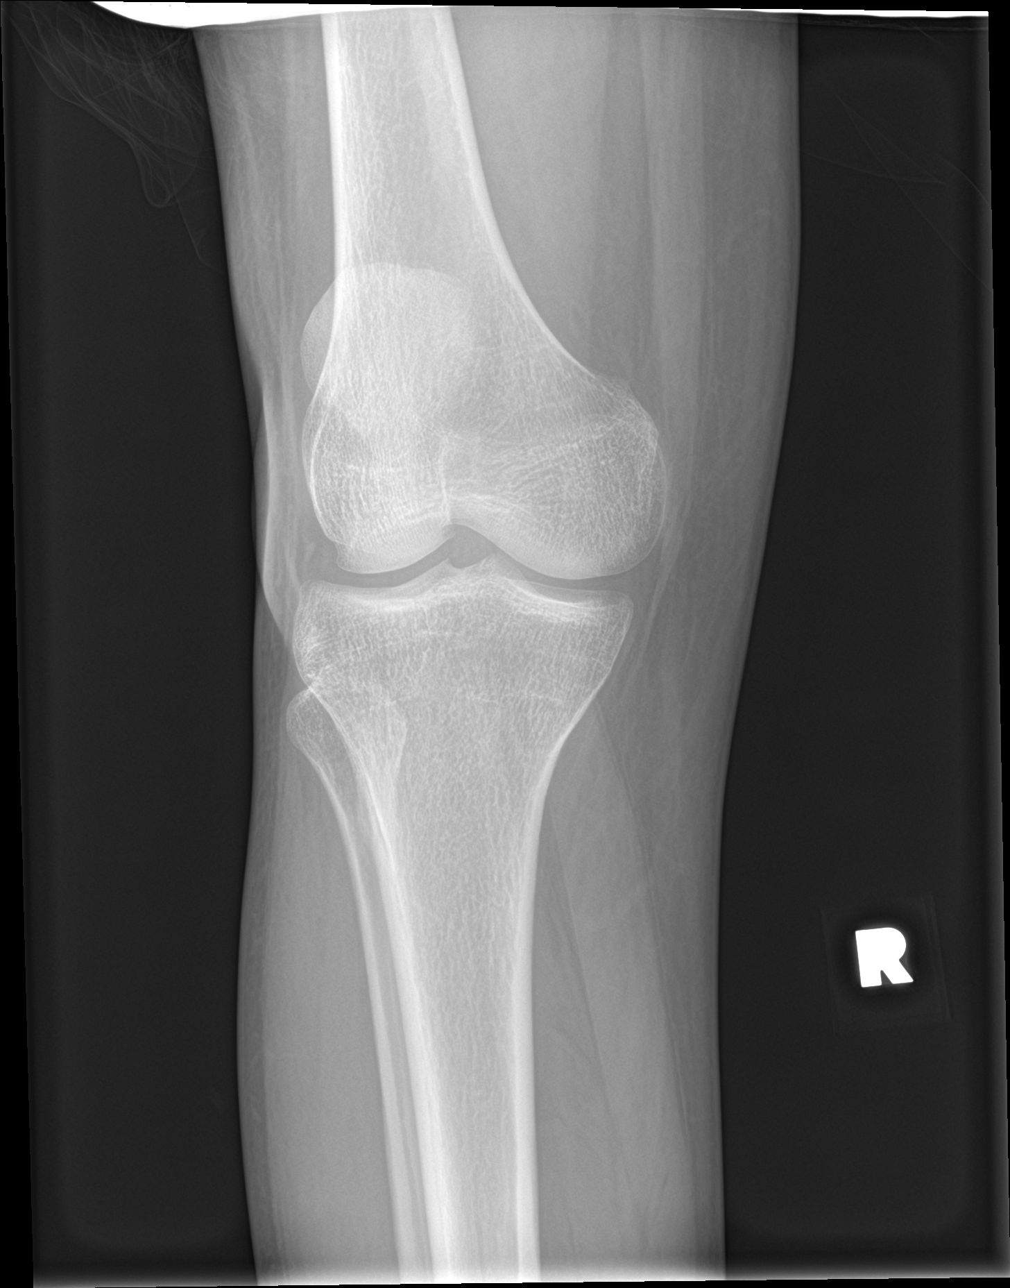

[knee lat]
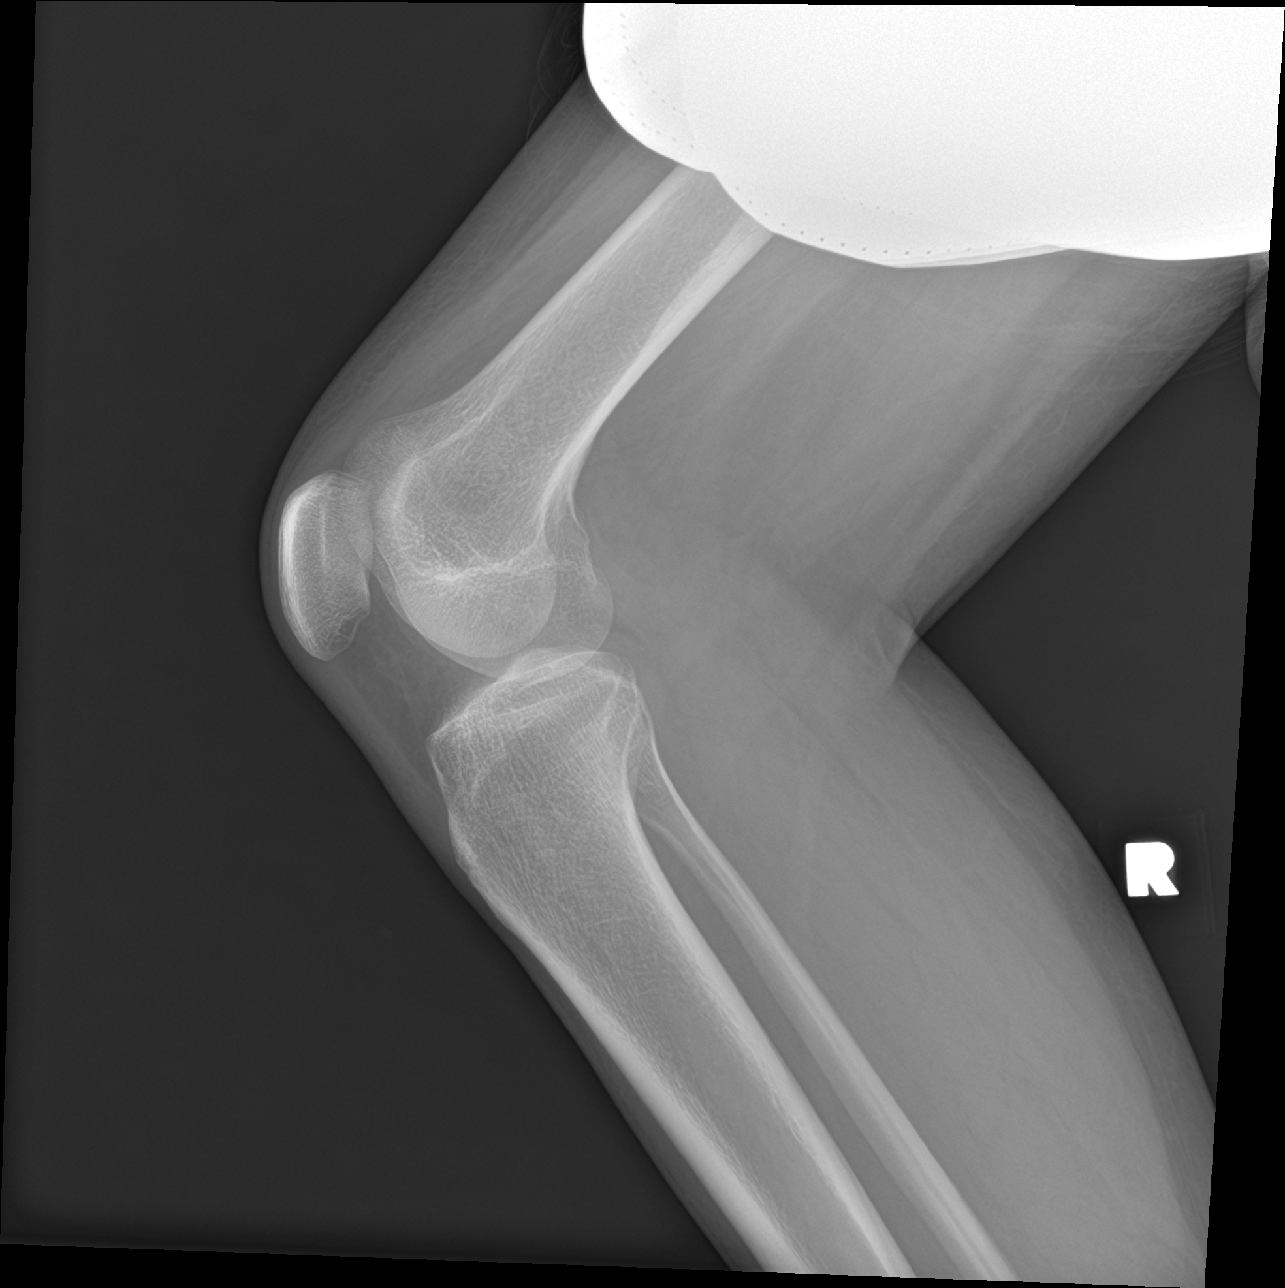

[4 of 4 positions shown; findings below may reference images not displayed]

FINDINGS: There is no evidence of fracture, dislocation, or joint effusion.
There is no evidence of arthropathy or other focal bone abnormality.
Soft tissues are unremarkable apart from mild swelling at the left
ankle.
IMPRESSION: No evidence of fractures at the right knee and ankle. There is mild
soft tissue swelling at the left ankle.

## 2021-11-21 IMAGING — CR DG ANKLE COMPLETE 3+V*L*
3 series · 3 of 3 positions shown · non-contrast
Comparison: None.

CLINICAL DATA: Fall injury.  Right knee and left ankle pain.

EXAM:
LEFT ANKLE COMPLETE - 3+ VIEW; RIGHT KNEE - COMPLETE 4+ VIEW

[ankle ap]
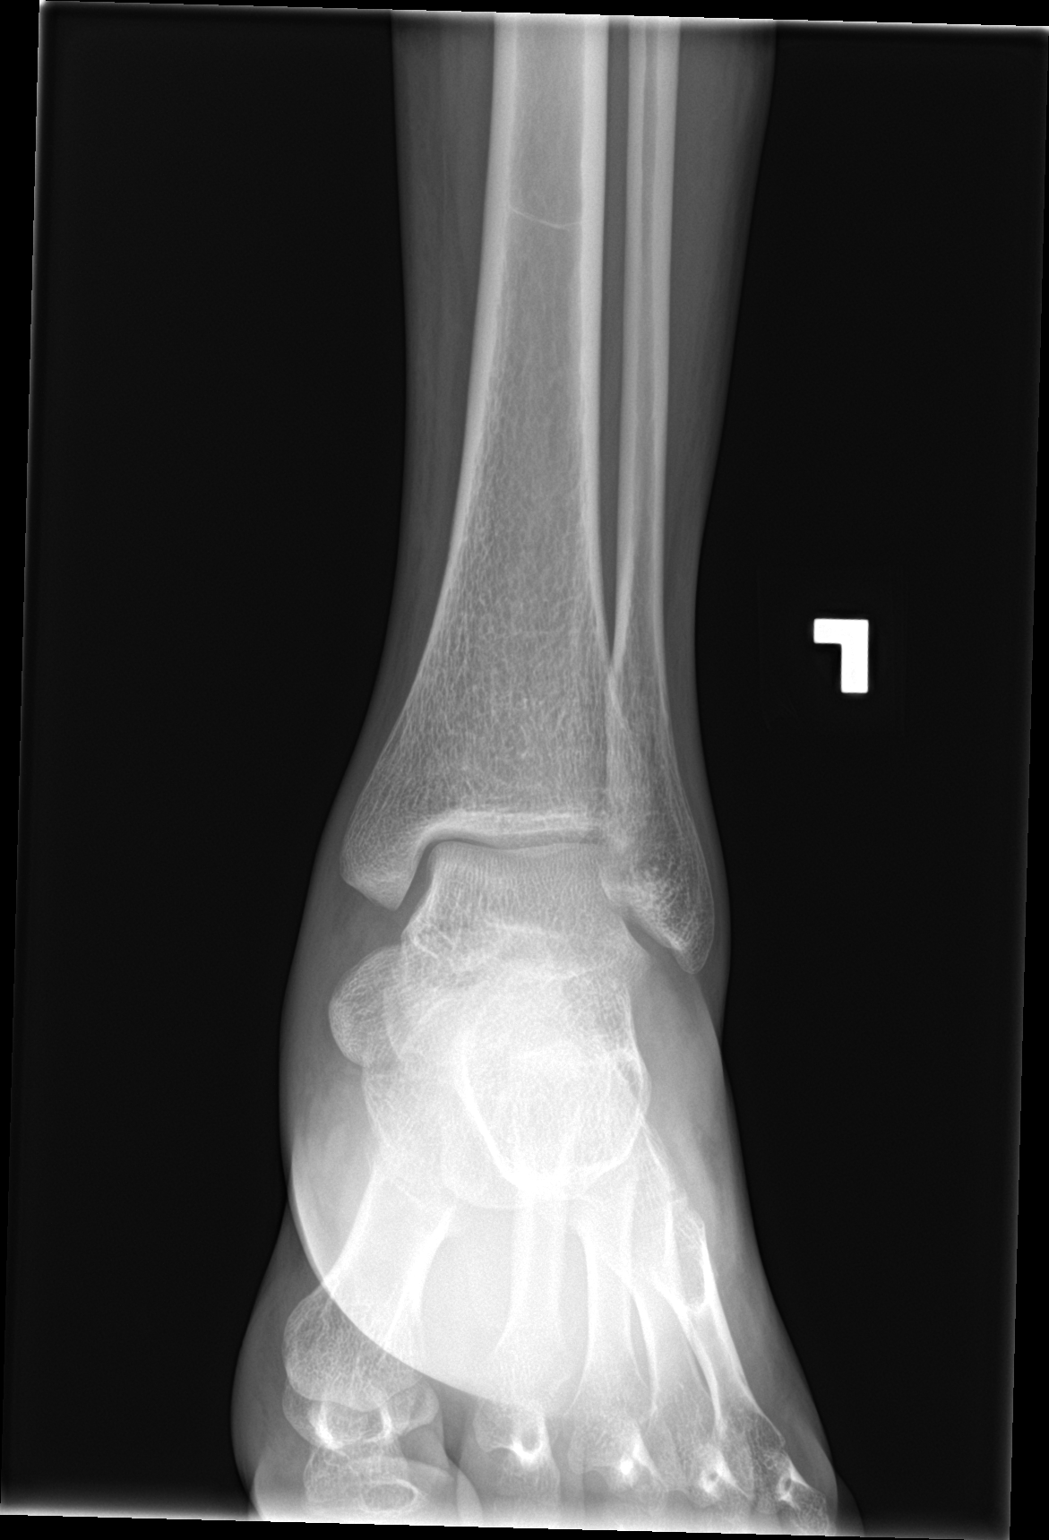

[ankle obl]
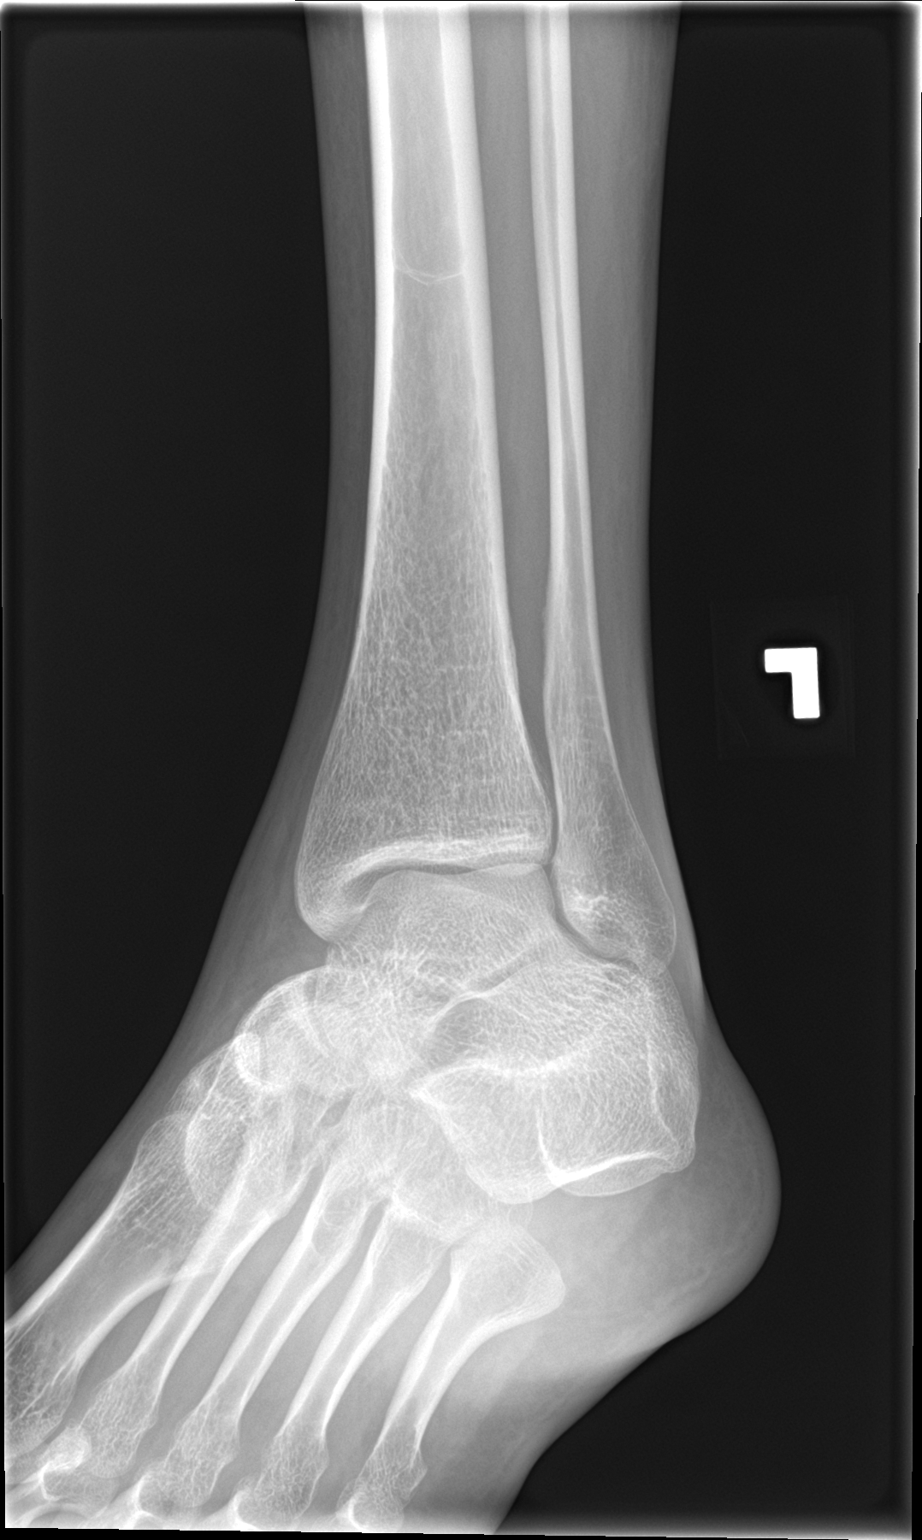

[ankle lat]
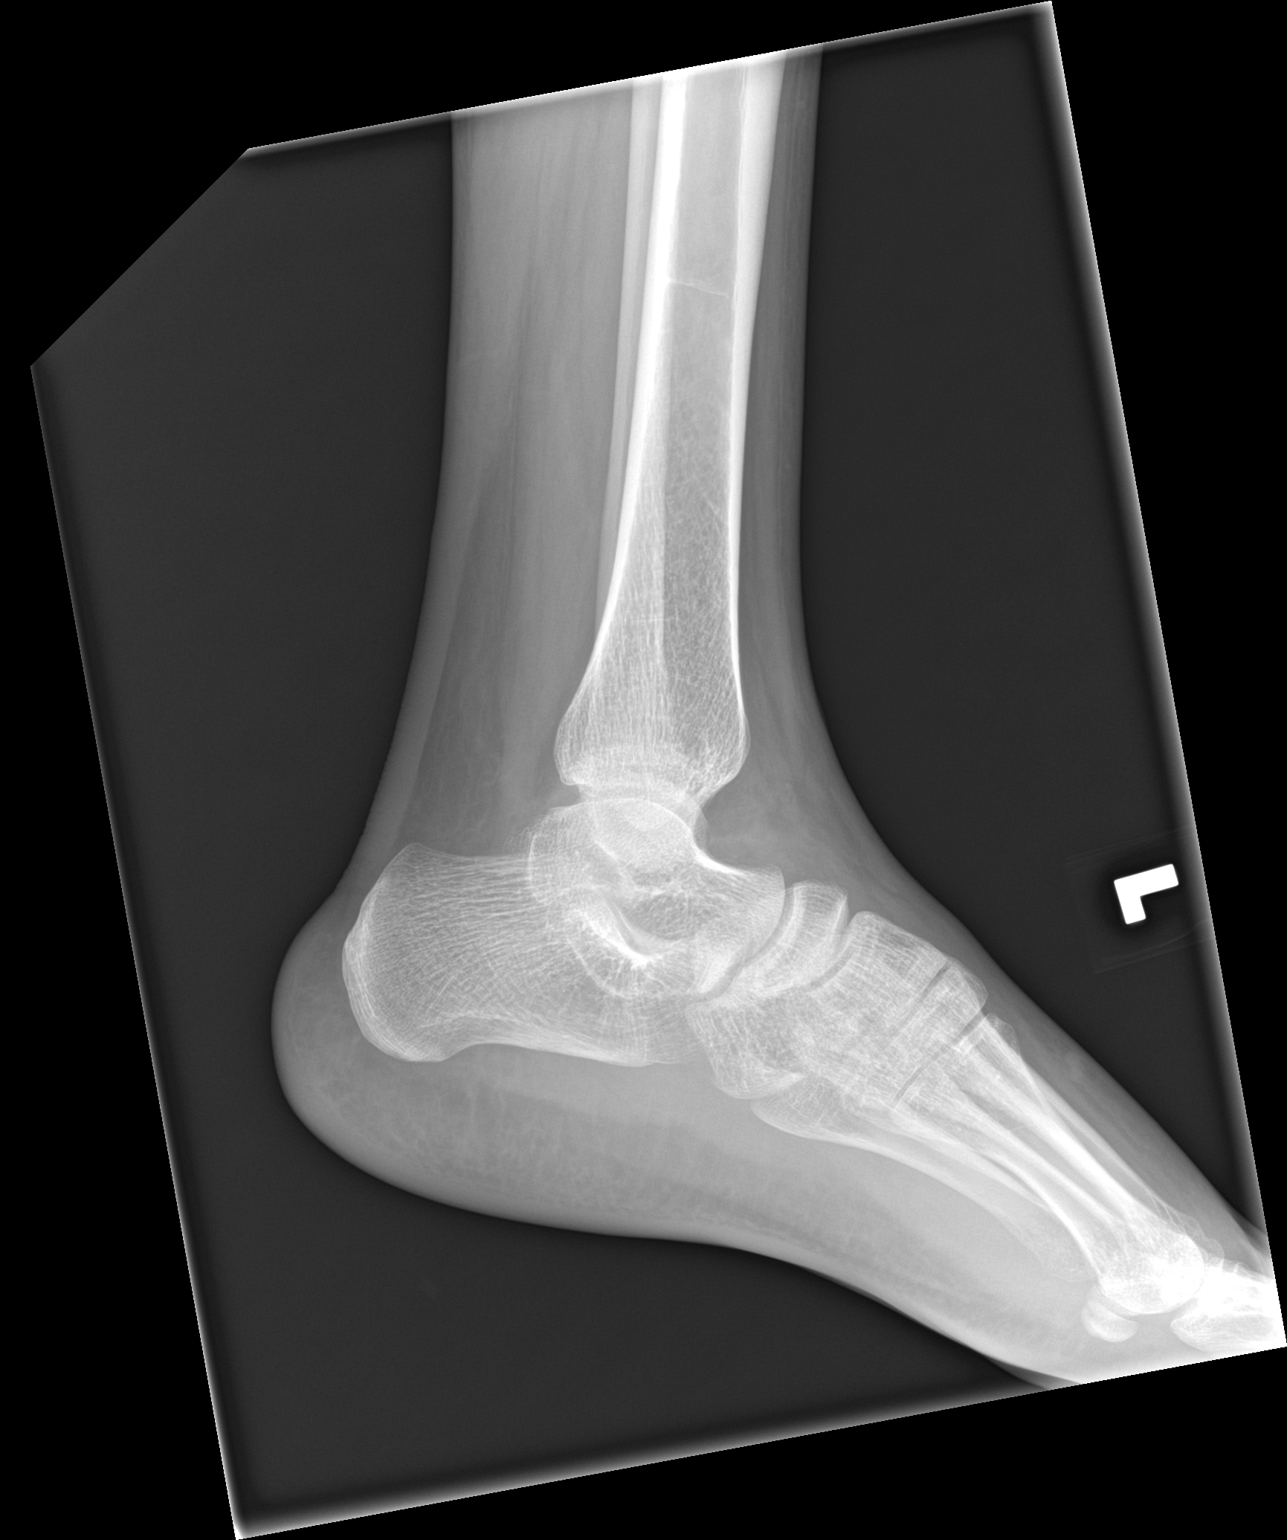

[3 of 3 positions shown; findings below may reference images not displayed]

FINDINGS: There is no evidence of fracture, dislocation, or joint effusion.
There is no evidence of arthropathy or other focal bone abnormality.
Soft tissues are unremarkable apart from mild swelling at the left
ankle.
IMPRESSION: No evidence of fractures at the right knee and ankle. There is mild
soft tissue swelling at the left ankle.

## 2021-11-21 MED ORDER — ACETAMINOPHEN 500 MG PO TABS
1000.0000 mg | ORAL_TABLET | Freq: Once | ORAL | Status: AC
  Administered 2021-11-21: 03:00:00 1000 mg via ORAL
  Filled 2021-11-21: qty 2

## 2021-11-21 MED ORDER — TERCONAZOLE 0.4 % VA CREA
1.0000 | TOPICAL_CREAM | Freq: Every day | VAGINAL | 0 refills | Status: DC
Start: 2021-11-21 — End: 2022-01-07

## 2021-11-21 MED ORDER — RHO D IMMUNE GLOBULIN 1500 UNIT/2ML IJ SOSY
300.0000 ug | PREFILLED_SYRINGE | Freq: Once | INTRAMUSCULAR | Status: AC
  Administered 2021-11-21: 04:00:00 300 ug via INTRAMUSCULAR
  Filled 2021-11-21: qty 2

## 2021-11-21 MED ORDER — LABETALOL HCL 200 MG PO TABS: 200.0000 mg | ORAL_TABLET | Freq: Two times a day (BID) | ORAL | 2 refills | Status: DC

## 2021-11-21 NOTE — Progress Notes (Signed)
Orthopedic Tech Progress Note Patient Details:  Kristin Clay 1986-11-04 [id number]  Ortho Devices Type of Ortho Device: Crutches Ortho Device/Splint Interventions: Ordered, Adjustment   Post Interventions Patient Tolerated: Fair Instructions Provided: Adjustment of device, Care of device, Poper ambulation with device  [name] 11/21/2021, 3:21 AM

## 2021-11-21 NOTE — ED Provider Notes (Signed)
Emergency Medicine Provider OB Triage Evaluation Note  Kristin Clay is a 35 y.o. female, [phone], at Unknown gestation who presents to the emergency department with complaints of fall.  States she missed a step yesterday and fell, scraped right knee and twisted left ankle.  Also states she had some abdominal pain afterwards but no vaginal bleeding, loss of fluids, etc.  Review of  Systems  Positive: fall, pregnant Negative: vaginal bleeding, loss of fluids.  Physical Exam  BP (!) 131/110    Pulse 79    Temp 98.4 F (36.9 C) (Oral)    Resp 16    SpO2 100%  General: Awake, no distress  HEENT: Atraumatic  Resp: Normal effort  Cardiac: Normal rate Abd: Nondistended, nontender  MSK: Moves all extremities without difficulty Neuro: Speech clear  Medical Decision Making  Pt evaluated for pregnancy concern and is stable for transfer to [facility]U. Pt is in agreement with plan for transfer.  12:29 AM Discussed with MAU APP, Misty Stanley-- will need clearance for orthopedic injuries first, then can came to [facility]U for [place] or can try to get it done in ED.  Clinical Impression    Fall in pregnancy.  1:09 AM X-rays of knee and ankle negative.  Cleared from orthopedic standpoint.  Will send to [facility]U for OB eval.   Garlon Hatchet, PA-C 11/21/21 0109    Marily Memos, MD 11/21/21 [phone]

## 2021-11-21 NOTE — MAU Provider Note (Signed)
Chief Complaint: [redacted] Weeks Pregnant [name] and Fall   Event Date/Time   First Provider Initiated Contact with Patient 11/21/21 0251      SUBJECTIVE HPI: omesha bowerman is a 35 y.o. [id number] at 61w6dby LMP who presents to maternity admissions reporting she was walking and tripped and fell to the ground today while holding her 435month old child. She twisted her left ankle and is unable to walk on her foot due to pain. She presented initially to the ED and had X-rays before transfer to the MAU for OB work up. She reports mild abdominal cramping since the fall.  There is no bleeding, there are no other symptoms.   Arabic video interpreter used for some medical communication with pt preference. Pt answered questions in English and did not prefer interpreter for most communication.   HPI  Past Medical History:  Diagnosis Date   Chronic hypertension    Chronic hypertension with superimposed severe preeclampsia 84/03/4097  Complication of anesthesia    with hand surgery, local anes did not work, tried twice- had to put her to sleep   Diabetes mellitus type 1 ([facility]    DKA (diabetic ketoacidosis) ([facility] 10/2020   Epidural hematoma 07/04/2021   GBS bacteriuria 06/09/2021   History of diabetic ketoacidosis 05/01/2021   Infection    UTI   Obstetric vaginal laceration with type 3c third degree perineal laceration 06/29/2021   Poor fetal growth complicating pregnancy, antepartum, third trimester, other fetus 06/19/2021   Pyelonephritis 10/2020   Rh negative status during pregnancy 05/01/2021   s/p Rhogam 05/13/21   Type 1 diabetes mellitus with ketoacidosis, uncontrolled ([place] 02/22/2020   Type I diabetes mellitus ([facility]    dx 19yrago   Past Surgical History:  Procedure Laterality Date   female circumcision Bilateral    clitorectomy as well   FOOT SURGERY  2018   HAND SURGERY  2019   Social History   Socioeconomic History   Marital status: Married    Spouse name: Not on file   Number of  children: Not on file   Years of education: Not on file   Highest education level: Not on file  Occupational History   Not on file  Tobacco Use   Smoking status: Never   Smokeless tobacco: Never  Vaping Use   Vaping Use: Never used  Substance and Sexual Activity   Alcohol use: Never   Drug use: Never   Sexual activity: Yes    Birth control/protection: None  Other Topics Concern   Not on file  Social History Narrative   ** Merged History Encounter **       ** Merged History Encounter **       Social Determinants of Health   Financial Resource Strain: High Risk   Difficulty of Paying Living Expenses: Very hard  Food Insecurity: No Food Insecurity   Worried About RuCharity fundraisern the Last Year: Never true   RaArboriculturistn the Last Year: Never true  Transportation Needs: No Transportation Needs   Lack of Transportation (Medical): No   Lack of Transportation (Non-Medical): No  Physical Activity: Not on file  Stress: Not on file  Social Connections: Not on file  Intimate Partner Violence: Not on file   No current facility-administered medications on file prior to encounter.   Current Outpatient Medications on File Prior to Encounter  Medication Sig Dispense Refill   acetaminophen (TYLENOL) 500 MG tablet Take 1,000  mg by mouth every 6 (six) hours as needed.     cyclobenzaprine (FLEXERIL) 5 MG tablet Take 1 tablet (5 mg total) by mouth at bedtime. 90 tablet 1   insulin glargine (LANTUS SOLOSTAR) 100 UNIT/ML Solostar Pen Inject 24 Units into the skin daily. 15 mL 3   insulin lispro (HUMALOG KWIKPEN) 100 UNIT/ML KwikPen Inject 8 Units into the skin 2 (two) times daily. 15 mL 2   labetalol (NORMODYNE) 100 MG tablet Take 100 mg by mouth 2 (two) times daily.     NIFEdipine (ADALAT CC) 90 MG 24 hr tablet Take 1 tablet (90 mg total) by mouth daily. 90 tablet 1   Prenatal Vit-Fe Fumarate-FA (PRENATAL VITAMIN) 27-0.8 MG TABS Take 1 tablet by mouth daily. 90 tablet 2   blood  glucose meter kit and supplies Dispense based on patient and insurance preference. Use up to four times daily as directed. (FOR ICD-10 E10.9, E11.9). 1 each 0   docusate sodium (COLACE) 100 MG capsule Take 1 capsule (100 mg total) by mouth 2 (two) times daily as needed for mild constipation. (Patient not taking: No sig reported) 14 capsule 0   ferrous sulfate 325 (65 FE) MG tablet Take 1 tablet (325 mg total) by mouth daily with breakfast. 30 tablet 3   fluconazole (DIFLUCAN) 150 MG tablet Take 1 tablet (150 mg total) by mouth every 3 (three) days. For three doses 3 tablet 0   glucose blood test strip Use as instructed QID 100 each 12   ibuprofen (ADVIL) 600 MG tablet Take 1 tablet (600 mg total) by mouth every 6 (six) hours. 60 tablet 1   Insulin Pen Needle (TECHLITE PEN NEEDLES) 32G X 4 MM MISC Use to inject insulin daily. 100 each 2   OneTouch Delica Lancets 27P MISC 1 Device by Does not apply route in the morning, at noon, in the evening, and at bedtime. 100 each 2   Allergies  Allergen Reactions   Pork-Derived Products     Pt does not want any pork products    ROS:  Review of Systems  Constitutional:  Negative for chills, fatigue and fever.  Respiratory:  Negative for shortness of breath.   Cardiovascular:  Negative for chest pain.  Gastrointestinal:  Positive for abdominal pain.  Genitourinary:  Negative for difficulty urinating, dysuria, flank pain, pelvic pain, vaginal bleeding, vaginal discharge and vaginal pain.  Musculoskeletal:  Positive for arthralgias and joint swelling.  Neurological:  Negative for dizziness and headaches.  Psychiatric/Behavioral: Negative.      I have reviewed patient's Past Medical Hx, Surgical Hx, Family Hx, Social Hx, medications and allergies.   Physical Exam  Patient Vitals for the past 24 hrs:  BP Temp Temp src Pulse Resp SpO2  11/21/21 0131 135/86 98 F (36.7 C) -- 84 16 --  11/21/21 0021 (!) 131/110 98.4 F (36.9 C) Oral 79 16 100 %    Constitutional: Well-developed, well-nourished female in mild distress.  Cardiovascular: normal rate Respiratory: normal effort GI: Abd soft, non-tender. Pos BS x 4 MS: Extremities nontender, no edema, normal ROM, except left ankle with edema, tenderness to touch Neurologic: Alert and oriented x 4.  GU: Neg CVAT.  PELVIC EXAM: vaginal cultures collected by blind swab   LAB RESULTS Results for orders placed or performed during the hospital encounter of 11/21/21 (from the past 24 hour(s))  Rh IG workup (includes ABO/Rh)     Status: None (Preliminary result)   Collection Time: 11/21/21  1:37 AM  Result  Value Ref Range   Gestational Age(Wks) 8    ABO/RH(D) A NEG    Antibody Screen      NEG Performed at [facility] [address] [place], [place] [address]    Unit Number [id number]    Blood Component Type RHIG    Unit division 00    Status of Unit ALLOCATED    Transfusion Status OK TO TRANSFUSE   CBC     Status: None   Collection Time: 11/21/21  1:37 AM  Result Value Ref Range   WBC 5.3 4.0 - 10.5 K/uL   RBC 4.82 3.87 - 5.11 MIL/uL   Hemoglobin 13.8 12.0 - 15.0 g/dL   HCT 40.5 36.0 - 46.0 %   MCV 84.0 80.0 - 100.0 fL   MCH 28.6 26.0 - 34.0 pg   MCHC 34.1 30.0 - 36.0 g/dL   RDW 13.0 11.5 - 15.5 %   Platelets 240 150 - 400 K/uL   nRBC 0.0 0.0 - 0.2 %  hCG, quantitative, pregnancy     Status: Abnormal   Collection Time: 11/21/21  1:37 AM  Result Value Ref Range   hCG, Beta Chain, Quant, S 69,260 (H) <5 mIU/mL  Wet prep, genital     Status: Abnormal   Collection Time: 11/21/21  1:58 AM   Specimen: Vaginal  Result Value Ref Range   Yeast Wet Prep HPF POC PRESENT (A) NONE SEEN   Trich, Wet Prep NONE SEEN NONE SEEN   Clue Cells Wet Prep HPF POC NONE SEEN NONE SEEN   WBC, Wet Prep HPF POC >=10 (A) <10   Sperm NONE SEEN     --/--/A NEG (12/30 0137)  IMAGING DG Ankle Complete Left  Result Date: 11/21/2021 CLINICAL DATA:  Fall injury.  Right knee and left  ankle pain. EXAM: LEFT ANKLE COMPLETE - 3+ VIEW; RIGHT KNEE - COMPLETE 4+ VIEW COMPARISON:  None. FINDINGS: There is no evidence of fracture, dislocation, or joint effusion. There is no evidence of arthropathy or other focal bone abnormality. Soft tissues are unremarkable apart from mild swelling at the left ankle. IMPRESSION: No evidence of fractures at the right knee and ankle. There is mild soft tissue swelling at the left ankle. Electronically Signed   By: Telford Nab M.D.   On: 11/21/2021 01:06   US OB Comp Less 14 Wks  Result Date: 11/21/2021 CLINICAL DATA:  Evaluate for viability. LMP: 09/20/2021 corresponding to an estimated gestational age of [redacted] weeks 6 days. EXAM: OBSTETRIC <14 WK ULTRASOUND TECHNIQUE: Transabdominal ultrasound was performed for evaluation of the gestation as well as the maternal uterus and adnexal regions. COMPARISON:  None. FINDINGS: Intrauterine gestational sac: Single intrauterine gestational sac. Yolk sac:  Seen Embryo:  Present Cardiac Activity: Detected Heart Rate: 173 bpm CRL:   21 mm   8 w 5 d                  [place] EDC: 06/29/2019 Subchorionic hemorrhage:  None visualized. Maternal uterus/adnexae: The maternal ovaries are unremarkable. There is a corpus luteum in the left ovary. IMPRESSION: Single live intrauterine pregnancy with an estimated gestational age of [redacted] weeks, 5 days concordant with age based on LMP. Electronically Signed   By: Anner Crete M.D.   On: 11/21/2021 02:24   DG Knee Complete 4 Views Right  Result Date: 11/21/2021 CLINICAL DATA:  Fall injury.  Right knee and left ankle pain. EXAM: LEFT ANKLE COMPLETE - 3+ VIEW; RIGHT KNEE - COMPLETE  4+ VIEW COMPARISON:  None. FINDINGS: There is no evidence of fracture, dislocation, or joint effusion. There is no evidence of arthropathy or other focal bone abnormality. Soft tissues are unremarkable apart from mild swelling at the left ankle. IMPRESSION: No evidence of fractures at the right knee and ankle. There  is mild soft tissue swelling at the left ankle. Electronically Signed   By: Telford Nab M.D.   On: 11/21/2021 01:06    MAU Management/MDM: Orders Placed This Encounter  Procedures   Wet prep, genital   DG Ankle Complete Left   DG Knee Complete 4 Views Right   US OB Comp Less 14 Wks   CBC   hCG, quantitative, pregnancy   AMB referral to orthopedics   Rh IG workup (includes ABO/Rh)   Discharge patient    Meds ordered this encounter  Medications   acetaminophen (TYLENOL) tablet 1,000 mg   labetalol (NORMODYNE) 200 MG tablet    Sig: Take 1 tablet (200 mg total) by mouth 2 (two) times daily.    Dispense:  60 tablet    Refill:  2    Order Specific Question:   Supervising Provider    Answer:   Donnamae Jude [[id number]]   terconazole (TERAZOL 7) 0.4 % vaginal cream    Sig: Place 1 applicator vaginally at bedtime.    Dispense:  45 g    Refill:  0    Order Specific Question:   Supervising Provider    Answer:   Donnamae Jude [[id number]]    X-rays done in ED prior to patient transfer are wnl, no evidence of fracture.  Pt transferred to MAU for OB work up.  IUP noted on today's [place] with normal FHR.  Tylenol 1000 mg PO given in MAU. Rhophylac given for Rh negative with trauma/fall today.  Consulted ED MD and orthopedic referral made and ortho technician called and pt fitted for crutches for discharge.  Message sent to [facility] at [facility] for high risk pregnancy.   Tx for vaginal yeast seen on wet prep and hx yeast due to elevated blood glucose with Terazol 7  Return to MAU as needed for emergencies.   ASSESSMENT 1. Left ankle injury, initial encounter   2. Abdominal pain during pregnancy in first trimester   3. Traumatic injury during pregnancy in first trimester   4. Fall at home, initial encounter   5. [redacted] weeks gestation of pregnancy   6. Normal IUP (intrauterine pregnancy) on prenatal ultrasound, first trimester   7. Vaginal candidiasis     PLAN Discharge home Allergies as of  11/21/2021       Reactions   Pork-derived Products    Pt does not want any pork products        Medication List     STOP taking these medications    cyclobenzaprine 5 MG tablet Commonly known as: FLEXERIL   fluconazole 150 MG tablet Commonly known as: DIFLUCAN   ibuprofen 600 MG tablet Commonly known as: ADVIL   NIFEdipine 90 MG 24 hr tablet Commonly known as: Adalat CC       TAKE these medications    acetaminophen 500 MG tablet Commonly known as: TYLENOL Take 1,000 mg by mouth every 6 (six) hours as needed.   blood glucose meter kit and supplies Dispense based on patient and insurance preference. Use up to four times daily as directed. (FOR ICD-10 E10.9, E11.9).   docusate sodium 100 MG capsule Commonly known as: Colace Take  1 capsule (100 mg total) by mouth 2 (two) times daily as needed for mild constipation.   FeroSul 325 (65 FE) MG tablet Generic drug: ferrous sulfate Take 1 tablet (325 mg total) by mouth daily with breakfast.   glucose blood test strip Use as instructed QID   insulin lispro 100 UNIT/ML KwikPen Commonly known as: HumaLOG KwikPen Inject 8 Units into the skin 2 (two) times daily.   labetalol 200 MG tablet Commonly known as: NORMODYNE Take 1 tablet (200 mg total) by mouth 2 (two) times daily. What changed:  medication strength how much to take   Lantus SoloStar 100 UNIT/ML Solostar Pen Generic drug: insulin glargine Inject 24 Units into the skin daily.   OneTouch Delica Lancets 71G Misc 1 Device by Does not apply route in the morning, at noon, in the evening, and at bedtime.   Prenatal Vitamin 27-0.8 MG Tabs Take 1 tablet by mouth daily.   TechLite Pen Needles 32G X 4 MM Misc Generic drug: Insulin Pen Needle Use to inject insulin daily.   terconazole 0.4 % vaginal cream Commonly known as: TERAZOL 7 Place 1 applicator vaginally at bedtime.        Follow-up Amber for [facility] at [facility] Follow up.   Specialty: Obstetrics and Gynecology Why: The clinic will call you with appointment. Contact information: [address] [place] [address] [phone]        [facility] Follow up.   Specialty: Obstetrics and Gynecology Why: As needed for emergencies Contact information: [address] [id number] [place] Certified Nurse-Midwife 11/21/2021  3:59 AM

## 2021-11-21 NOTE — ED Notes (Signed)
Report given to MAU RN .  

## 2021-11-21 NOTE — ED Triage Notes (Signed)
Patient is [redacted] weeks pregnant G3P1 , missed her step/tripped  and fell this evening , denies LOC , reports left ankle pain with mild swelling and abrasion at right knee. No vaginal spotting or abdominal contractions or cramping .

## 2021-11-21 NOTE — MAU Note (Addendum)
Pt came over from Main ED after a fall. No fractures in L ankle but some swelling and pt does not bear wt on L foot. Came over to check out the pregnancy. Reports being about [redacted]wks pregnant. Pt has positive upt result from [facility] from October 31, 2021. Asked pt before Triage if she wanted an interpreter and pt stated she understood

## 2021-11-22 LAB — RH IG WORKUP (INCLUDES ABO/RH)
ABO/RH(D): A NEG
Antibody Screen: NEGATIVE
Gestational Age(Wks): 8
Unit division: 0

## 2021-11-26 ENCOUNTER — Ambulatory Visit (INDEPENDENT_AMBULATORY_CARE_PROVIDER_SITE_OTHER): Payer: Medicaid Other

## 2021-11-26 ENCOUNTER — Encounter: Payer: Self-pay | Admitting: Family

## 2021-11-26 ENCOUNTER — Ambulatory Visit (INDEPENDENT_AMBULATORY_CARE_PROVIDER_SITE_OTHER): Payer: Medicaid Other | Admitting: Family

## 2021-11-26 ENCOUNTER — Other Ambulatory Visit: Payer: Self-pay

## 2021-11-26 DIAGNOSIS — S99912A Unspecified injury of left ankle, initial encounter: Secondary | ICD-10-CM

## 2021-11-26 DIAGNOSIS — S93492A Sprain of other ligament of left ankle, initial encounter: Secondary | ICD-10-CM

## 2021-11-26 NOTE — Progress Notes (Signed)
Office Visit Note   Patient: Kristin Clay           Date of Birth: 1986-06-22           MRN: [id number] Visit Date: 11/26/2021              Requested by: Hurshel Party, CNM [address] [place],  [place] [address] PCP: Georganna Skeans, MD  No chief complaint on file.     HPI: The patient is a 36 year old woman who is currently in her first trimester of her pregnancy who had a fall when she missed a step and twisted her left ankle.  She did have radiographs of the ankle and knee on December 30 these were negative  She presents today on crutches she states she is unable to bear weight on the left ankle due to pain  Assessment & Plan: Visit Diagnoses:  1. Injury of left ankle, initial encounter     Plan: Ankle sprain on the left.  Concern for syndesmosis injury however her stress radiographs were reassuring.  We will place her in a cam walker.  She may weight-bear as tolerated in the cam walker continue using the crutches as needed.  Plan to follow-up in 2 weeks  Follow-Up Instructions: No follow-ups on file.   Left Ankle Exam   Tenderness  The patient is experiencing tenderness in the ATF, CF, deltoid and lateral malleolus.  Swelling: moderate  Muscle Strength  Left ankle normal muscle strength: deferred d/t pain.  Other  Erythema: absent Pulse: present     Patient is alert, oriented, no adenopathy, well-dressed, normal affect, normal respiratory effort.   Imaging: No results found. No images are attached to the encounter.  Labs: Lab Results  Component Value Date   HGBA1C 12.6 (A) 09/05/2021   HGBA1C 8.4 (H) 05/01/2021   HGBA1C 9.8 (H) 04/11/2021   REPTSTATUS 06/25/2021 FINAL 06/24/2021   CULT (A) 06/24/2021    GROUP B STREP(S.AGALACTIAE)ISOLATED TESTING AGAINST S. AGALACTIAE NOT ROUTINELY PERFORMED DUE TO PREDICTABILITY OF AMP/PEN/VAN SUSCEPTIBILITY. Performed at [facility], [address], [place], [place]  [address]    LABORGA STAPHYLOCOCCUS CAPITIS (A) 05/22/2021     Lab Results  Component Value Date   ALBUMIN 2.5 (L) 07/04/2021   ALBUMIN 2.0 (L) 07/01/2021   ALBUMIN 2.4 (L) 06/29/2021    Lab Results  Component Value Date   MG 2.0 10/28/2020   MG 1.7 10/27/2020   MG 1.7 10/25/2020   No results found for: VD25OH  No results found for: PREALBUMIN CBC EXTENDED Latest Ref Rng & Units 11/21/2021 07/05/2021 07/04/2021  WBC 4.0 - 10.5 K/uL 5.3 7.9 5.1  RBC 3.87 - 5.11 MIL/uL 4.82 3.77(L) 3.59(L)  HGB 12.0 - 15.0 g/dL 56.2 10.9(L) 10.6(L)  HCT 36.0 - 46.0 % 40.5 34.3(L) 31.6(L)  PLT 150 - 400 K/uL 240 308 328  NEUTROABS 1.7 - 7.7 K/uL - - 3.1  LYMPHSABS 0.7 - 4.0 K/uL - - 1.4     There is no height or weight on file to calculate BMI.  Orders:  Orders Placed This Encounter  Procedures   XR Ankle Complete Left   No orders of the defined types were placed in this encounter.    Procedures: No procedures performed  Clinical Data: No additional findings.  ROS:  All other systems negative, except as noted in the HPI. Review of Systems  Objective: Vital Signs: LMP 09/20/2021   Specialty Comments:  No specialty comments available.  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   Past Medical History:  Diagnosis Date   Chronic hypertension    Chronic hypertension with superimposed severe preeclampsia 06/23/2021   Complication of anesthesia    with hand surgery, local anes did not work, tried twice- had to put her to sleep   Diabetes mellitus type 1 (HCC)    DKA (diabetic ketoacidosis) (HCC) 10/2020   Epidural hematoma 07/04/2021   GBS bacteriuria 06/09/2021   History of diabetic ketoacidosis 05/01/2021   Infection    UTI   Obstetric vaginal laceration with type 3c third degree  perineal laceration 06/29/2021   Poor fetal growth complicating pregnancy, antepartum, third trimester, other fetus 06/19/2021   Pyelonephritis 10/2020   Rh negative status during pregnancy 05/01/2021   s/p Rhogam 05/13/21   Type 1 diabetes mellitus with ketoacidosis, uncontrolled (HCC) 02/22/2020   Type I diabetes mellitus (HCC)    dx 10yrs ago    Family History  Problem Relation Age of Onset   Hyperlipidemia Mother    Hypertension Mother    Kidney disease Father    Alzheimer's disease Father     Past Surgical History:  Procedure Laterality Date   female circumcision Bilateral    clitorectomy as well   FOOT SURGERY  2018   HAND SURGERY  2019   Social History   Occupational History   Not on file  Tobacco Use   Smoking status: Never   Smokeless tobacco: Never  Vaping Use   Vaping Use: Never used  Substance and Sexual Activity   Alcohol use: Never   Drug use: Never   Sexual activity: Yes    Birth control/protection: None

## 2021-11-27 ENCOUNTER — Telehealth (INDEPENDENT_AMBULATORY_CARE_PROVIDER_SITE_OTHER): Payer: Medicaid Other

## 2021-11-27 VITALS — BP 116/80 | HR 88 | Wt 121.8 lb

## 2021-11-27 DIAGNOSIS — E10649 Type 1 diabetes mellitus with hypoglycemia without coma: Secondary | ICD-10-CM | POA: Diagnosis not present

## 2021-11-27 DIAGNOSIS — O0990 Supervision of high risk pregnancy, unspecified, unspecified trimester: Secondary | ICD-10-CM | POA: Insufficient documentation

## 2021-11-27 DIAGNOSIS — O26899 Other specified pregnancy related conditions, unspecified trimester: Secondary | ICD-10-CM | POA: Insufficient documentation

## 2021-11-27 DIAGNOSIS — Z6791 Unspecified blood type, Rh negative: Secondary | ICD-10-CM | POA: Diagnosis not present

## 2021-11-27 NOTE — Progress Notes (Signed)
Patient was assessed and managed by nursing staff during this encounter. I have reviewed the chart and agree with the documentation and plan. I have also made any necessary editorial changes.  Catalina Antigua, MD 11/27/2021 12:00 PM

## 2021-11-27 NOTE — Progress Notes (Signed)
Called pt at both cell and home numbers with [facility] # Kristin Clay [phone] @ 1017 and left message that I am calling in regards to her NEW OB intake appt if she could please give the office a call back.  I will call back in 15 minutes in which if I do not reach you I will have to request that you reschedule your NEW OB intake and provider appt.    1030- Received message from front office that pt has arrived for in person visit.   Kristin Naegeli, RN  11/27/2021  New OB Intake  I connected with  Kristin Clay on 11/27/21 at 10:15 AM EST by in person visit.   I discussed the limitations, risks, security and privacy concerns of performing an evaluation and management service by telephone and the availability of in person appointments. I also discussed with the patient that there may be a patient responsible charge related to this service. The patient expressed understanding and agreed to proceed.  I explained I am completing New OB Intake today. We discussed her EDD of 06/27/2022  that is based on LMP of 09/20/21. Pt is G3/P1. I reviewed her allergies, medications, Medical/Surgical/OB history, and appropriate screenings. I informed her of [facility] services. Based on history, this is a/an  pregnancy complicated by preterm delivery and Type 1 Diabetes  .   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    Delivery Plans:  Plans to deliver at [facility].   MyChart/Babyscripts Patient requests to have in person visits.    Blood Pressure Cuff Patient informed me that she has her blood pressure cuff from prior pregnancy.  Patient will have in person visits.    Weight scale: Patient does  have weight scale at home.    Anatomy US Explained first scheduled Korea will be around 19 weeks. Anatomy US scheduled for March 13th at 1015.   Labs Discussed [name] genetic screening with patient. Would like Panorama drawn at new OB visit. Routine prenatal labs needed.  Covid Vaccine Patient has covid vaccine.   CenteringPregnancy Candidate?  If yes, offer as possibility  Patient is not a candidate.    Mother/ Baby Dyad Candidate?    If yes, offer as possibility  Patient is not a candidate.    Informed patient of [facility] website  and placed link in her AVS.   Social Determinants of Health Food Insecurity: Patient denies food insecurity. WIC Referral: Patient is interested in referral to [facility].  Transportation: Patient denies transportation needs. Childcare: Discussed no children allowed at ultrasound appointments. Offered childcare services; patient declines childcare services at this time.  Send link to Pregnancy Navigators   Placed OB Box on problem list and updated  First visit review I reviewed new OB appt with pt. I explained she will have a pelvic exam and ob bloodwork with genetic screening. Explained pt will be seen by Dr. Macon Large at first visit; encounter routed to appropriate provider. Explained that patient will be seen by pregnancy navigator following visit with provider. [facility] information placed in AVS.   Kristin Bathe, RN 11/27/2021  10:40 AM

## 2021-11-27 NOTE — Patient Instructions (Addendum)
Safe Medications in Pregnancy   Acne:  Benzoyl Peroxide  Salicylic Acid   Backache/Headache:  Tylenol: 2 regular strength every 4 hours OR               2 Extra strength every 6 hours   Colds/Coughs/Allergies:  Benadryl (alcohol free) 25 mg every 6 hours as needed  Breath right strips  Claritin  Cepacol throat lozenges  Chloraseptic throat spray  Cold-Eeze- up to three times per day  Cough drops, alcohol free  Flonase (by prescription only)  Guaifenesin  Mucinex  Robitussin DM (plain only, alcohol free)  Saline nasal spray/drops  Sudafed (pseudoephedrine) & Actifed * use only after [redacted] weeks gestation and if you do not have high blood pressure  Tylenol  Vicks Vaporub  Zinc lozenges  Zyrtec   Constipation:  Colace  Ducolax suppositories  Fleet enema  Glycerin suppositories  Metamucil  Milk of magnesia  Miralax  Senokot  Smooth move tea   Diarrhea:  Kaopectate  Imodium A-D   *NO pepto Bismol   Hemorrhoids:  Anusol  Anusol HC  Preparation H  Tucks   Indigestion:  Tums  Maalox  Mylanta  Zantac  Pepcid   Insomnia:  Benadryl (alcohol free) 25mg  every 6 hours as needed  Tylenol PM  Unisom, no Gelcaps   Leg Cramps:  Tums  MagGel   Nausea/Vomiting:  Bonine  Dramamine  Emetrol  Ginger extract  Sea bands  Meclizine  Nausea medication to take during pregnancy:  Unisom (doxylamine succinate 25 mg tablets) Take one tablet daily at bedtime. If symptoms are not adequately controlled, the dose can be increased to a maximum recommended dose of two tablets daily (1/2 tablet in the morning, 1/2 tablet mid-afternoon and one at bedtime).  Vitamin B6 100mg  tablets. Take one tablet twice a day (up to 200 mg per day).   Skin Rashes:  Aveeno products  Benadryl cream or 25mg  every 6 hours as needed  Calamine Lotion  1% cortisone cream   Yeast infection:  Gyne-lotrimin 7  Monistat 7    **If taking multiple medications, please check labels to avoid  duplicating the same active ingredients  **take medication as directed on the label  ** Do not exceed 4000 mg of tylenol in 24 hours  **Do not take medications that contain aspirin or ibuprofen          Pregnancy Navigator information link:   [email or web address].safelinks.[email or web address] .tfaforms. 

## 2021-12-01 ENCOUNTER — Other Ambulatory Visit: Payer: Self-pay | Admitting: *Deleted

## 2021-12-01 DIAGNOSIS — O24319 Unspecified pre-existing diabetes mellitus in pregnancy, unspecified trimester: Secondary | ICD-10-CM

## 2021-12-01 DIAGNOSIS — O0990 Supervision of high risk pregnancy, unspecified, unspecified trimester: Secondary | ICD-10-CM

## 2021-12-01 NOTE — Progress Notes (Signed)
fer

## 2021-12-08 ENCOUNTER — Ambulatory Visit: Payer: 59 | Admitting: Family Medicine

## 2021-12-09 ENCOUNTER — Encounter (INDEPENDENT_AMBULATORY_CARE_PROVIDER_SITE_OTHER): Payer: Self-pay

## 2021-12-09 ENCOUNTER — Ambulatory Visit (INDEPENDENT_AMBULATORY_CARE_PROVIDER_SITE_OTHER): Payer: Medicaid Other

## 2021-12-09 ENCOUNTER — Encounter: Payer: Self-pay | Admitting: Family Medicine

## 2021-12-09 ENCOUNTER — Ambulatory Visit (INDEPENDENT_AMBULATORY_CARE_PROVIDER_SITE_OTHER): Payer: Medicaid Other | Admitting: Family Medicine

## 2021-12-09 ENCOUNTER — Other Ambulatory Visit: Payer: Self-pay

## 2021-12-09 VITALS — BP 126/77 | HR 84 | Temp 98.1°F | Resp 16 | Wt 123.4 lb

## 2021-12-09 DIAGNOSIS — Z3490 Encounter for supervision of normal pregnancy, unspecified, unspecified trimester: Secondary | ICD-10-CM | POA: Diagnosis not present

## 2021-12-09 DIAGNOSIS — E11649 Type 2 diabetes mellitus with hypoglycemia without coma: Secondary | ICD-10-CM

## 2021-12-09 DIAGNOSIS — M79672 Pain in left foot: Secondary | ICD-10-CM

## 2021-12-09 LAB — POCT GLYCOSYLATED HEMOGLOBIN (HGB A1C): Hemoglobin A1C: 10.4 % — AB (ref 4.0–5.6)

## 2021-12-09 IMAGING — DX DG FOOT COMPLETE 3+V*L*
3 series · 3 of 3 positions shown · non-contrast
Comparison: None.

CLINICAL DATA: Left foot pain.

EXAM:
LEFT FOOT - COMPLETE 3+ VIEW

[foot supine dp]
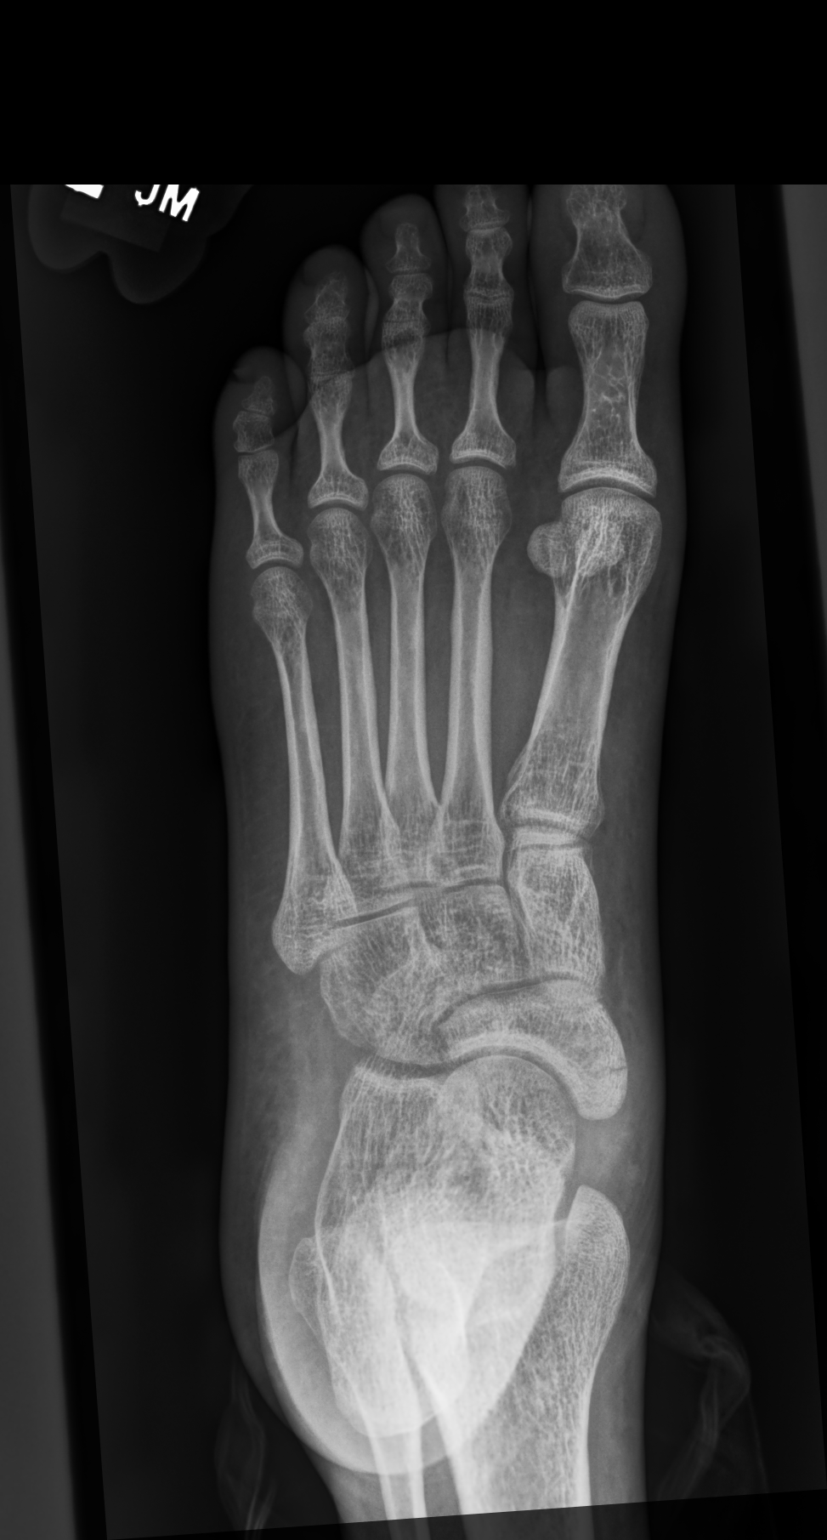

[foot medial oblique]
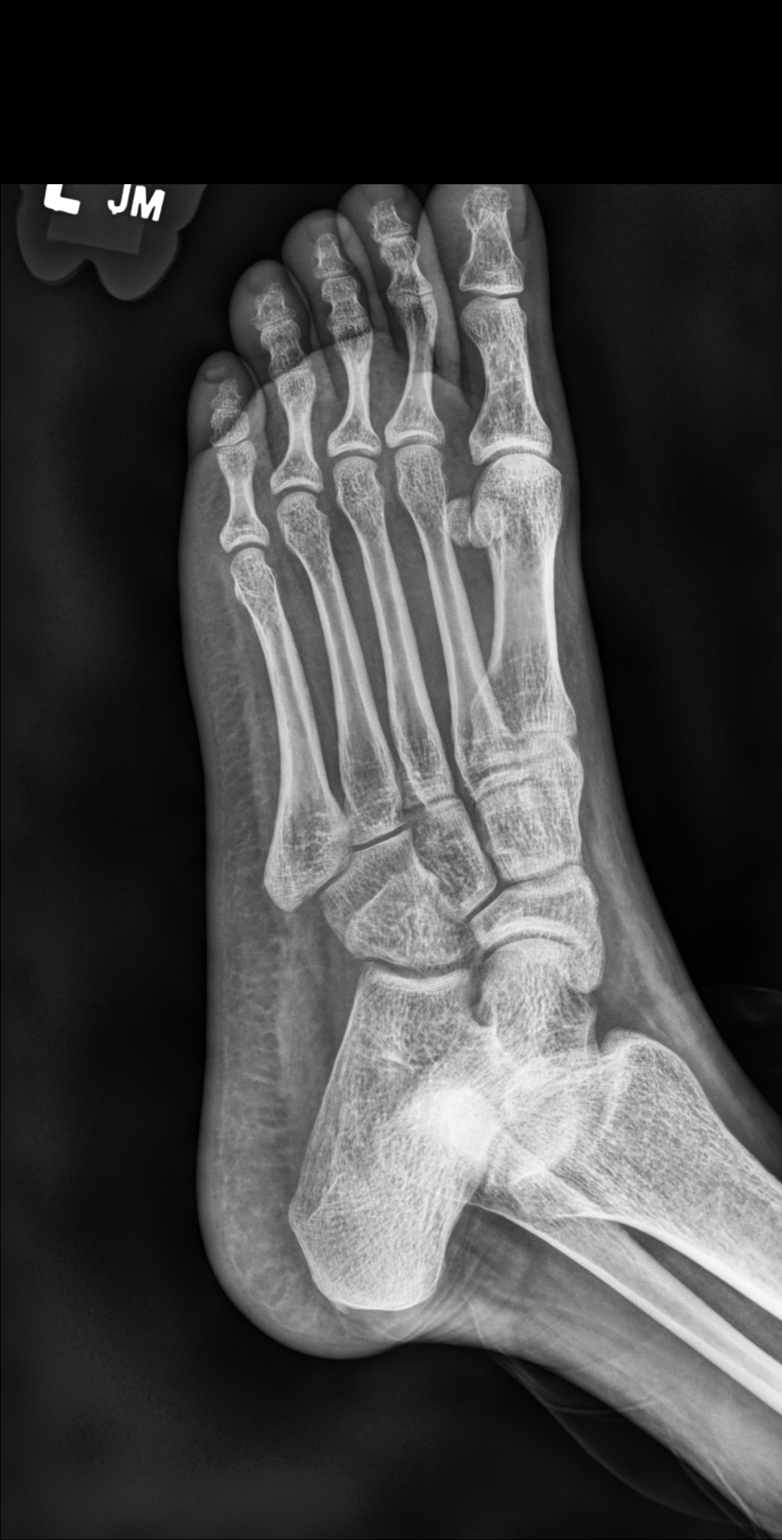

[foot supine lat]
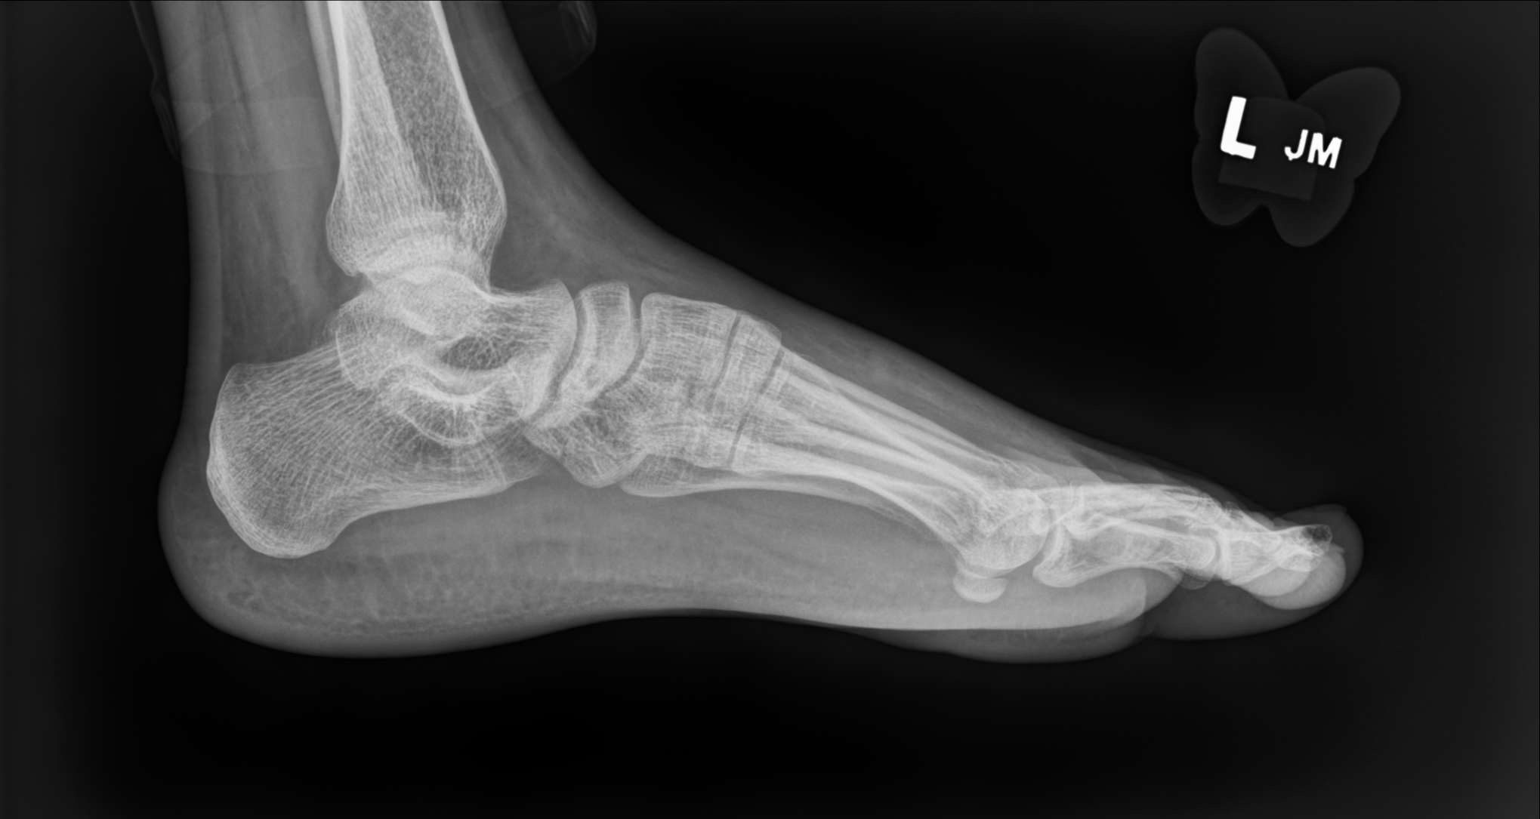

[3 of 3 positions shown; findings below may reference images not displayed]

FINDINGS: There is no acute fracture or dislocation. The bones are osteopenic.
No significant arthritic changes. The soft tissues are unremarkable.
IMPRESSION: No acute fracture or dislocation.

## 2021-12-09 NOTE — Progress Notes (Signed)
Established Patient Office Visit  Subjective:  Patient ID: Kristin Clay, female    DOB: 1986-08-16  Age: 36 y.o. MRN: [id number]  CC:  Chief Complaint  Patient presents with   Follow-up   Hypertension    HPI [place] presents for follow up of diabetes. Patient also reports that she is [redacted] weeks pregnant and has a new OB appointment on January 23. Lastly patient reports that she injure her left foot when she stumbled over something while walking and fee. She reports continued pain and difficulty ambulating. This visit was aided by an interpretor.   Past Medical History:  Diagnosis Date   Chronic hypertension    Chronic hypertension with superimposed severe preeclampsia 05/28/2262   Complication of anesthesia    with hand surgery, local anes did not work, tried twice- had to put her to sleep   Diabetes mellitus type 1 ([place])    DKA (diabetic ketoacidosis) ([place]) 10/2020   Epidural hematoma 07/04/2021   GBS bacteriuria 06/09/2021   History of diabetic ketoacidosis 05/01/2021   Infection    UTI   Obstetric vaginal laceration with type 3c third degree perineal laceration 06/29/2021   Poor fetal growth complicating pregnancy, antepartum, third trimester, other fetus 06/19/2021   Pyelonephritis 10/2020   Rh negative status during pregnancy 05/01/2021   s/p Rhogam 05/13/21   Type 1 diabetes mellitus with ketoacidosis, uncontrolled ([place]) 02/22/2020   Type I diabetes mellitus ([place])    dx 5yr ago    Past Surgical History:  Procedure Laterality Date   female circumcision Bilateral    clitorectomy as well   FOOT SURGERY  2018   HAND SURGERY  2019    Family History  Problem Relation Age of Onset   Hyperlipidemia Mother    Hypertension Mother    Kidney disease Father    Alzheimer's disease Father     Social History   Socioeconomic History   Marital status: Married    Spouse name: Not on file   Number of children: Not on file   Years of education: Not on  file   Highest education level: Not on file  Occupational History   Not on file  Tobacco Use   Smoking status: Never   Smokeless tobacco: Never  Vaping Use   Vaping Use: Never used  Substance and Sexual Activity   Alcohol use: Never   Drug use: Never   Sexual activity: Yes    Birth control/protection: None  Other Topics Concern   Not on file  Social History Narrative   ** Merged History Encounter **       ** Merged History Encounter **       Social Determinants of Health   Financial Resource Strain: High Risk   Difficulty of Paying Living Expenses: Very hard  Food Insecurity: No Food Insecurity   Worried About RCharity fundraiserin the Last Year: Never true   RArboriculturistin the Last Year: Never true  Transportation Needs: No Transportation Needs   Lack of Transportation (Medical): No   Lack of Transportation (Non-Medical): No  Physical Activity: Not on file  Stress: Not on file  Social Connections: Not on file  Intimate Partner Violence: Not on file    ROS Review of Systems  Genitourinary: Negative.   All other systems reviewed and are negative.  Objective:   Today's Vitals: BP 126/77    Pulse 84    Temp 98.1 F (36.7 C) (Oral)  Resp 16    Wt 123 lb 6.4 oz (56 kg)    LMP 09/20/2021    SpO2 99%    BMI 22.57 kg/m   Physical Exam Vitals and nursing note reviewed.  Constitutional:      General: She is not in acute distress. Cardiovascular:     Rate and Rhythm: Normal rate and regular rhythm.  Pulmonary:     Effort: Pulmonary effort is normal.     Breath sounds: Normal breath sounds.  Musculoskeletal:     Right foot: Normal.     Left foot: Decreased range of motion. Tenderness present. No swelling or deformity.  Neurological:     General: No focal deficit present.     Mental Status: She is alert and oriented to person, place, and time.    Assessment & Plan:   1. Uncontrolled type 2 diabetes mellitus with hypoglycemia without coma (HCC) Improved  A1c but still above goal. Continue present management until seen by OB  - POCT glycosylated hemoglobin (Hb A1C)  2. Left foot pain Referral for foot xray  - DG Foot Complete Left; Future  3. Pregnancy, unspecified gestational age Keep scheduled follow up with OB first appt. Patient will most likely by  high-risk 2/2 uncontrolled diabetes.   Outpatient Encounter Medications as of 12/09/2021  Medication Sig   acetaminophen (TYLENOL) 500 MG tablet Take 1,000 mg by mouth every 6 (six) hours as needed.   blood glucose meter kit and supplies Dispense based on patient and insurance preference. Use up to four times daily as directed. (FOR ICD-10 E10.9, E11.9).   glucose blood test strip Use as instructed QID   insulin glargine (LANTUS SOLOSTAR) 100 UNIT/ML Solostar Pen Inject 24 Units into the skin daily.   insulin lispro (HUMALOG KWIKPEN) 100 UNIT/ML KwikPen Inject 8 Units into the skin 2 (two) times daily.   Insulin Pen Needle (TECHLITE PEN NEEDLES) 32G X 4 MM MISC Use to inject insulin daily.   OneTouch Delica Lancets 78M MISC 1 Device by Does not apply route in the morning, at noon, in the evening, and at bedtime.   Prenatal Vit-Fe Fumarate-FA (PRENATAL VITAMIN) 27-0.8 MG TABS Take 1 tablet by mouth daily.   terconazole (TERAZOL 7) 0.4 % vaginal cream Place 1 applicator vaginally at bedtime.   No facility-administered encounter medications on file as of 12/09/2021.    Follow-up: No follow-ups on file.   Becky Sax, MD

## 2021-12-09 NOTE — Progress Notes (Signed)
Patient is here for follow-up 3 months. Patient has questions about her diabetes. Patient is expecting a child Patient fell down and hurt her leg x 1 month 11/20/2021

## 2021-12-10 ENCOUNTER — Encounter: Payer: 59 | Admitting: Obstetrics and Gynecology

## 2021-12-11 ENCOUNTER — Encounter: Payer: Medicaid Other | Attending: Obstetrics and Gynecology | Admitting: Registered"

## 2021-12-11 ENCOUNTER — Ambulatory Visit (INDEPENDENT_AMBULATORY_CARE_PROVIDER_SITE_OTHER): Payer: Medicaid Other | Admitting: Registered"

## 2021-12-11 ENCOUNTER — Other Ambulatory Visit: Payer: Self-pay

## 2021-12-11 DIAGNOSIS — Z3A00 Weeks of gestation of pregnancy not specified: Secondary | ICD-10-CM | POA: Insufficient documentation

## 2021-12-11 DIAGNOSIS — O24013 Pre-existing diabetes mellitus, type 1, in pregnancy, third trimester: Secondary | ICD-10-CM | POA: Insufficient documentation

## 2021-12-11 DIAGNOSIS — O24012 Pre-existing diabetes mellitus, type 1, in pregnancy, second trimester: Secondary | ICD-10-CM | POA: Insufficient documentation

## 2021-12-11 DIAGNOSIS — E108 Type 1 diabetes mellitus with unspecified complications: Secondary | ICD-10-CM | POA: Insufficient documentation

## 2021-12-11 DIAGNOSIS — O24011 Pre-existing diabetes mellitus, type 1, in pregnancy, first trimester: Secondary | ICD-10-CM

## 2021-12-11 DIAGNOSIS — O24019 Pre-existing diabetes mellitus, type 1, in pregnancy, unspecified trimester: Secondary | ICD-10-CM | POA: Diagnosis present

## 2021-12-11 NOTE — Patient Instructions (Signed)
We can get you started using a continuous glucose monitor.  First steps before starting:  Get your Apple ID password so you can download the Dexcom G6 app  Install 2 apps on your phone: Dexcom G6 app Clarity  Set up a user name and password for Dexcom  Make sure you can access your email account so you can share your blood sugar data with me.  Return for another visit to get the monitor attached  ???? ?????? ???????? ???????. ??????? ?????? ??? ?????:  ???? ??? ???? ???? ???? Apple ????? ?? ??? ????? ?? ????? ????? Dexcom G6  ?? ?????? ??????? ??? ?????:  ????? Dexcom G6  ????  ?? ?????? ??? ?????? ????? ???? ?? Dexcom  ???? ?? ??? ????? ?????? ??? ???? ????? ?????????? ??? ????? ?? ?????? ?????? ????? ?? ???? ???.  ?????? ?????? ???? ?????? ??? ?????? ???????

## 2021-12-11 NOTE — Progress Notes (Signed)
Diabetes Self-Management Education  Visit Type: First/Initial  Appt. Start Time: 0915 Appt. End Time: 1020  12/11/2021  [name], identified by name and date of birth, is a 36 y.o. female with a diagnosis of Diabetes: Type 1 (pregnant).   EDD: 06/27/2022; [redacted]w[redacted]d  This patient is accompanied in the office by her  [facility] interpreter Kristin Clay .  ASSESSMENT  Last menstrual period 09/20/2021, unknown if currently breastfeeding. There is no height or weight on file to calculate BMI.  A1c 10.4% Medications: Lantus 24 units in the morning; Humalog 8 u with lunch and dinner (not taking with breakfast)  Pt reports low blood sugar symptoms at 74 mg/dL 2x recently. Both times were when she woke up but states she normally doesn't check FBS, states she was only told to check 2 hrs PPBG.  SMBG: Pt reports she checks blood sugar 2 hours after meals Needs more strips. Pt states she doesn't record blood sugar. RD provided pt log to use and encouraged her to write numbers down and take to healthcare visits.  Patient asked about insulin pump. Patient needs to check blood sugar more consistently prior to starting pump. Insurance will likely cover CGM and we will plan to start her asap on a sample Dexcom G6.  Pt reported eating 2 c pasta with breakfast, no humalog injected with meal. Pt reports that she thought she was only supposed to use Levemir in the morning. Pt reports injecting 8 units of Humalog with lunch (meat and vegetables, no carbs) and again with dinner.  Patient has not had eye exam in 3 years since she has been in the [place], states she was waiting for a referral. Pt's PCP, Kristin Skeans, MD was contacted for advice. Her reply "usually tell patients that unless their insurance pays for an eye exam and/or they have specific medical conditions that they need to find somewhere like [facility] or [facility] or [facility] etc that do reasonable exams (cash)  RD will go over this with  patient at next visit.  Meal structure: 10:30 am breakfast, 12:00 lunch, 10:30 pm dinner Sleep: 12am - 8 or 9 am   Diabetes Self-Management Education - 12/11/21 1028       Visit Information   Visit Type First/Initial      Initial Visit   Diabetes Type Type 1   pregnant   Are you taking your medications as prescribed? No   patient did not understand Rx instructions     Psychosocial Assessment   Self-care barriers English as a second language    Other persons present Interpreter      Complications   Last HgB A1C per patient/outside source 10.4 %    How often do you check your blood sugar? 3-4 times/day    Have you had a dilated eye exam in the past 12 months? No   waiting for a referral     Dietary Intake   Breakfast egg, 2 c pasta    Lunch salad with meat or egg    Dinner bread, meat soup with vegetables, salad    Beverage(s) water      Patient Education   Previous Diabetes Education Yes (please comment)    Nutrition management  Meal options for control of blood glucose level and chronic complications.    Medications Reviewed patients medication for diabetes, action, purpose, timing of dose and side effects.    Monitoring Other (comment)   CGM     Individualized Goals (developed by patient)  Nutrition Other (comment)   Consistent carbohydrate   Medications take my medication as prescribed    Monitoring  Other (comment)   prepare for CGM start     Outcomes   Expected Outcomes Demonstrated interest in learning. Expect positive outcomes    Future DMSE 2 wks    Program Status Not Completed             Individualized Plan for Diabetes Self-Management Training:   Learning Objective:  Patient will have a greater understanding of diabetes self-management. Patient education plan is to attend individual and/or group sessions per assessed needs and concerns.   Patient Instructions  We can get you started using a continuous glucose monitor.  First steps before  starting:  Get your Apple ID password so you can download the Dexcom G6 app  Install 2 apps on your phone: Dexcom G6 app Clarity  Set up a user name and password for Dexcom  Make sure you can access your email account so you can share your blood sugar data with me.  Return for another visit to get the monitor attached  ???? ?????? ???????? ???????. ??????? ?????? ??? ?????:  ???? ??? ???? ???? ???? Apple ????? ?? ??? ????? ?? ????? ????? Dexcom G6  ?? ?????? ??????? ??? ?????:  ????? Dexcom G6  ????  ?? ?????? ??? ?????? ????? ???? ?? Dexcom  ???? ?? ??? ????? ?????? ??? ???? ????? ?????????? ??? ????? ?? ?????? ?????? ????? ?? ???? ???.  ?????? ?????? ???? ?????? ??? ?????? ???????   Pt read AVS and confirmed that the internet translation was accurate  Expected Outcomes:  Demonstrated interest in learning. Expect positive outcomes  Education material provided: Print out from [employer] in Arabic. Types of insulin graph to explain the difference between long acting and humalog  If problems or questions, patient to contact team via:  Phone  Future DSME appointment: 2 wks

## 2021-12-12 ENCOUNTER — Encounter: Payer: Self-pay | Admitting: Family

## 2021-12-12 ENCOUNTER — Ambulatory Visit (INDEPENDENT_AMBULATORY_CARE_PROVIDER_SITE_OTHER): Payer: Medicaid Other | Admitting: Family

## 2021-12-12 ENCOUNTER — Ambulatory Visit: Payer: Self-pay

## 2021-12-12 DIAGNOSIS — S99912A Unspecified injury of left ankle, initial encounter: Secondary | ICD-10-CM | POA: Diagnosis not present

## 2021-12-15 ENCOUNTER — Other Ambulatory Visit: Payer: Self-pay

## 2021-12-15 ENCOUNTER — Other Ambulatory Visit (HOSPITAL_COMMUNITY)
Admission: RE | Admit: 2021-12-15 | Discharge: 2021-12-15 | Disposition: A | Discharge status: Home / Self Care | Payer: Medicaid Other | Source: Ambulatory Visit | Attending: Obstetrics and Gynecology | Admitting: Obstetrics and Gynecology

## 2021-12-15 ENCOUNTER — Encounter: Payer: Self-pay | Admitting: Obstetrics & Gynecology

## 2021-12-15 ENCOUNTER — Encounter: Payer: Self-pay | Admitting: *Deleted

## 2021-12-15 ENCOUNTER — Ambulatory Visit (INDEPENDENT_AMBULATORY_CARE_PROVIDER_SITE_OTHER): Payer: Medicaid Other | Admitting: Obstetrics & Gynecology

## 2021-12-15 VITALS — BP 126/80 | HR 86 | Wt 120.9 lb

## 2021-12-15 DIAGNOSIS — Z23 Encounter for immunization: Secondary | ICD-10-CM | POA: Diagnosis not present

## 2021-12-15 DIAGNOSIS — O0990 Supervision of high risk pregnancy, unspecified, unspecified trimester: Secondary | ICD-10-CM | POA: Diagnosis not present

## 2021-12-15 DIAGNOSIS — O24012 Pre-existing diabetes mellitus, type 1, in pregnancy, second trimester: Secondary | ICD-10-CM

## 2021-12-15 DIAGNOSIS — O10919 Unspecified pre-existing hypertension complicating pregnancy, unspecified trimester: Secondary | ICD-10-CM | POA: Insufficient documentation

## 2021-12-15 DIAGNOSIS — O1414 Severe pre-eclampsia complicating childbirth: Secondary | ICD-10-CM

## 2021-12-15 DIAGNOSIS — Z6791 Unspecified blood type, Rh negative: Secondary | ICD-10-CM

## 2021-12-15 DIAGNOSIS — Z3A12 12 weeks gestation of pregnancy: Secondary | ICD-10-CM

## 2021-12-15 DIAGNOSIS — O09899 Supervision of other high risk pregnancies, unspecified trimester: Secondary | ICD-10-CM | POA: Insufficient documentation

## 2021-12-15 DIAGNOSIS — O26899 Other specified pregnancy related conditions, unspecified trimester: Secondary | ICD-10-CM

## 2021-12-15 DIAGNOSIS — O09299 Supervision of pregnancy with other poor reproductive or obstetric history, unspecified trimester: Secondary | ICD-10-CM | POA: Insufficient documentation

## 2021-12-15 DIAGNOSIS — Z31430 Encounter of female for testing for genetic disease carrier status for procreative management: Secondary | ICD-10-CM | POA: Diagnosis not present

## 2021-12-15 DIAGNOSIS — O09219 Supervision of pregnancy with history of pre-term labor, unspecified trimester: Secondary | ICD-10-CM

## 2021-12-15 HISTORY — DX: Severe pre-eclampsia complicating childbirth: O14.14

## 2021-12-15 MED ORDER — INSULIN LISPRO (1 UNIT DIAL) 100 UNIT/ML (KWIKPEN): 8.0000 [IU] | PEN_INJECTOR | Freq: Two times a day (BID) | SUBCUTANEOUS | 2 refills | Status: DC

## 2021-12-15 NOTE — Progress Notes (Signed)
History:   Kristin Clay is a 36 y.o. [phone] at 70w2dby LMP being seen today for her first obstetrical visit.  [employer] Arabic interpreter used for encounter.  Her obstetrical history is significant for recent forceps assisted vaginal delivery on 06/29/2021 [facility] and  poorly controlled Type I DM at 354w6dShe declined any contraception postpartum after extensive counseling.  Also see problem list below: Patient Active Problem List   Diagnosis Date Noted   Chronic hypertension affecting pregnancy 12/15/2021   Short interval between pregnancies affecting pregnancy, antepartum 12/15/2021   History of severe preeclampsia, prior pregnancy, currently pregnant 12/15/2021   Previous preterm delivery due to severe preeclampsia, antepartum 12/15/2021   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, 12/11/2021   Supervision of high risk pregnancy, antepartum 11/27/2021   Rh negative, antepartum 11/27/2021   Poor compliance 08/04/2021   Hypertension 08/04/2021   DM (diabetes mellitus), type 1 (HCWhat [place]12/12/2019   Language barrier 01/11/2020   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 Pregnancy history fully reviewed.  Patient reports no obstetric complaints.      HISTORY: OB History  Gravida Para Term Preterm AB Living  3 1 0 _0 SAB IAB Ectopic Multiple Live Births  1 0 0 0 1    # Outcome Date GA Lbr Len/2nd Weight Sex Delivery Anes PTL Lv  3 Current           2 Preterm 06/29/21 [id number] 4 lb 1.6 oz (1.86 kg) F Vag-Forceps EPI  LIV     Birth Comments: WDL     Name: Kristin Clay  Apgar1: 7  Apgar5: 8  1 SAB 12/2019            Last pap smear was done 05/01/2021 and was normal  Past Medical History:  Diagnosis Date   Chronic hypertension    Chronic hypertension with superimposed severe preeclampsia 07/08/37/4536Complication of anesthesia    with hand surgery, local anes did not  work, tried twice- had to put her to sleep   Diabetes mellitus type 1 ([place]  DKA (diabetic ketoacidosis) ([place]/20[place]   Epidural hematoma 07/04/2021   GBS bacteriuria 06/09/2021   Obstetric vaginal laceration with type 3c third degree perineal laceration 06/29/2021   Poor fetal growth complicating pregnancy, antepartum, third trimester, other fetus 06/19/2021   Pyelonephritis 10/2020   Rh negative status during pregnancy 05/01/2021   s/p Rhogam 05/13/21   Type 1 diabetes mellitus with ketoacidosis, uncontrolled ([place]CC[place]4/11/2019   Type I diabetes mellitus ([place]  dx [id number]   UTI (urinary tract infection)    Past Surgical History:  Procedure Laterality Date   female circumcision Bilateral    clitorectomy as well   FOOT SURGERY  2018   HAND SURGERY  2019   Family History  Problem Relation Age of Onset   Hyperlipidemia Mother    Hypertension Mother    Kidney disease Father    Alzheimer's disease Father    Social History   Tobacco Use   Smoking status: Never   Smokeless tobacco: Never  Vaping Use   Vaping Use: Never used  Substance Use Topics   Alcohol use: Never   Drug use: Never   Allergies  Allergen Reactions   Pork-Derived Products     Pt does not want any pork products   Current Outpatient Medications on  File Prior to Visit  Medication Sig Dispense Refill   acetaminophen (TYLENOL) 500 MG tablet Take 1,000 mg by mouth every 6 (six) hours as needed.     aspirin 81 MG chewable tablet Chew by mouth daily.     blood glucose meter kit and supplies Dispense based on patient and insurance preference. Use up to four times daily as directed. (FOR ICD-10 E10.9, E11.9). 1 each 0   insulin glargine (LANTUS SOLOSTAR) 100 UNIT/ML Solostar Pen Inject 24 Units into the skin daily. 15 mL 3   Insulin Pen Needle (TECHLITE PEN NEEDLES) 32G X 4 MM MISC Use to inject insulin daily. 100 each 2   labetalol (NORMODYNE) 100 MG tablet Take 100 mg by mouth 2 (two) times daily.      NIFEdipine (ADALAT CC) 90 MG 24 hr tablet Take 90 mg by mouth daily.     OneTouch Delica Lancets 01U MISC 1 Device by Does not apply route in the morning, at noon, in the evening, and at bedtime. 100 each 2   Prenatal Vit-Fe Fumarate-FA (PRENATAL VITAMIN) 27-0.8 MG TABS Take 1 tablet by mouth daily. 90 tablet 2   terconazole (TERAZOL 7) 0.4 % vaginal cream Place 1 applicator vaginally at bedtime. 45 g 0   glucose blood test strip Use as instructed QID (Patient not taking: Reported on 12/15/2021) 100 each 12   No current facility-administered medications on file prior to visit.    Review of Systems Pertinent items noted in HPI and remainder of comprehensive ROS otherwise negative.  Physical Exam:   Vitals:   12/15/21 1012  BP: 126/80  Pulse: 86  Weight: 120 lb 14.4 oz (54.8 kg)   Fetal Heart Rate (bpm): 160   General: well-developed, well-nourished female in no acute distress  Breasts:  deferred  Skin: normal coloration and turgor, no rashes  Neurologic: oriented, normal, negative, normal mood  Extremities: normal strength, tone, and muscle mass, ROM of all joints is normal  HEENT PERRLA, extraocular movement intact and sclera clear, anicteric  Neck supple and no masses  Cardiovascular: regular rate and rhythm  Respiratory:  no respiratory distress, normal breath sounds  Abdomen: soft, non-tender; bowel sounds normal; no masses,  no organomegaly  Pelvic: deferred    Assessment:    Pregnancy: [id number] Patient Active Problem List   Diagnosis Date Noted   Chronic hypertension affecting pregnancy 12/15/2021   Short interval between pregnancies affecting pregnancy, antepartum 12/15/2021   History of severe preeclampsia, prior pregnancy, currently pregnant 12/15/2021   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, 12/11/2021   Supervision of high risk pregnancy, antepartum 11/27/2021   Rh negative, antepartum 11/27/2021   Poor compliance 08/04/2021   Hypertension  08/04/2021   DM (diabetes mellitus), type 1 ([place]) 10/24/2020   Language barrier 01/11/2020   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, Does not have testing supplies, will meet with Levie Heritage, RD tomorrow. Being considered for Dexcom CGM.  Continue current insulin regimen for now.  Discussed implications of DM in pregnancy, need for optimizing glycemic control to decrease DM associated maternal-fetal morbidity and mortality, need for antenatal testing and frequent ultrasounds/prenatal visits. Will check baseline labs today. - Comprehensive metabolic panel - Hemoglobin A1c - AMB Referral to Cardio Obstetrics - TSH - Protein / creatinine ratio, urine - US Fetal Echocardiography; Future - insulin lispro (HUMALOG KWIKPEN) 100 UNIT/ML KwikPen; Inject 8 Units into the skin 2 (two) times daily.  Dispense: 15 mL; Refill: 2  2. Chronic hypertension affecting pregnancy 3. History of severe preeclampsia, prior pregnancy, currently pregnant 4. Previous preterm delivery due to severe preeclampsia, antepartum Already on Nifedipine XR 90 mg daily and Labetalol 100 mg po bid. Labs done. Follow BP closely. Referred to Cardio Obstetrics.  - AMB Referral to [place]  5. History of forceps delivery in prior pregnancy, currently pregnant 6. Short interval between pregnancies affecting pregnancy, antepartum Patient declined birth control after her traumatic birth in 06/2021. Emphasized importance of birth spacing especially given her multiple complications.   7. Rh negative, antepartum Already received Rhogam this pregnancy in [facility]U due to fall.  8. [redacted] weeks gestation of pregnancy 9. Supervision of high risk pregnancy, antepartum - [facility] AMB BABYSCRIPTS SCHEDULE OPTIMIZATION - Culture, OB Urine - Flu Vaccine QUAD 13moIM (Fluarix, Fluzone & Alfiuria Quad PF) - GC/Chlamydia probe amp ([place])[facility]- Genetic Screening -  CBC/D/Plt+RPR+Rh+ABO+RubIgG... Initial labs drawn. Continue prenatal vitamins. Problem list reviewed and updated. Genetic Screening discussed, NIPS: ordered. Ultrasound discussed; fetal anatomic survey:  scheduled . Anticipatory guidance about prenatal visits given including labs, ultrasounds, and testing. The nature [facility] with multiple MDs and Advanced Practice Providers was re-explained to patient; also emphasized that residents, students are part of our team. Routine obstetric precautions reviewed. Encouraged to seek out care at office or emergency [facility]) for urgent and/or emergent concerns. Return in about 3 weeks (around 01/05/2022) for OFFICE OB VISIT (MD only), AFP screen.     UVerita Schneiders MD, [place]for WDean Foods Company CSpeed

## 2021-12-16 ENCOUNTER — Ambulatory Visit (INDEPENDENT_AMBULATORY_CARE_PROVIDER_SITE_OTHER): Payer: Medicaid Other | Admitting: Registered"

## 2021-12-16 ENCOUNTER — Encounter: Payer: Self-pay | Admitting: Obstetrics & Gynecology

## 2021-12-16 ENCOUNTER — Other Ambulatory Visit: Payer: Self-pay

## 2021-12-16 ENCOUNTER — Encounter: Payer: Medicaid Other | Admitting: Registered"

## 2021-12-16 DIAGNOSIS — O1210 Gestational proteinuria, unspecified trimester: Secondary | ICD-10-CM | POA: Insufficient documentation

## 2021-12-16 DIAGNOSIS — O24012 Pre-existing diabetes mellitus, type 1, in pregnancy, second trimester: Secondary | ICD-10-CM

## 2021-12-16 DIAGNOSIS — O24019 Pre-existing diabetes mellitus, type 1, in pregnancy, unspecified trimester: Secondary | ICD-10-CM | POA: Diagnosis not present

## 2021-12-16 LAB — GC/CHLAMYDIA PROBE AMP (~~LOC~~) NOT AT ARMC
Chlamydia: NEGATIVE
Comment: NEGATIVE
Comment: NORMAL
Neisseria Gonorrhea: NEGATIVE

## 2021-12-16 LAB — PROTEIN / CREATININE RATIO, URINE
Creatinine, Urine: 116.4 mg/dL
Protein, Ur: 100.1 mg/dL
Protein/Creat Ratio: 860 mg/g creat — ABNORMAL HIGH (ref 0–200)

## 2021-12-16 MED ORDER — ACCU-CHEK GUIDE VI STRP: ORAL_STRIP | 12 refills | Status: DC

## 2021-12-16 MED ORDER — ACCU-CHEK SOFTCLIX LANCETS MISC: 12 refills | Status: DC

## 2021-12-16 NOTE — Progress Notes (Signed)
In-person interpreter services provided by Azucena Cecil from [facility]  Patient was seen on 12/16/21 for follow-up assessment and education for T1D in pregnancy. Start time1020  Appt End time 1108  EDD 06/27/22; [redacted]w[redacted]d.   When this visit was scheduled it was intended to start pt on Dexcom G6 sample. Pt states she was not able to get the required apps downloaded on her phone. Pt states the issue is she does not understand how to use her iPhone. Pt reports she will be getting a new Android phone today.   Patient has not been able to get more strips since last visit. Pt states she borrowed some strips from a friend. Yesterday and today's blood sugar readings have been ~200 mg/dL. RD called pharmacy and was told that they do not have an Rx for the strips that she needs. Clinical staff will send in Rx today.  Pt states she also ran out of Humalog is only using Lantus 24 u 1x/day.   The following learning objectives reviewed during follow-up visit:  Steps to set up CGM Need for CGM before starting process of getting insulin pump  Plan:  After getting new phone download the Dexcom G6 and Dexcom Clarity apps You need to create one user name and password that will work for both apps After you have both apps on your phone and you are able to open each one, call the office to make an appointment to start the CGM sample Pick up Humalog today and start taking again with meals. Get more strips and lancets at your pharmacy and continue to use your glucometer to check

## 2021-12-16 NOTE — Patient Instructions (Addendum)
???? ? ?? ?????? ??????   Dexcom G6 ? Dexcom Clarity  ????? ??? ????? ??? ?????? ???? ????? ???? ????? ???? ???? ?????????  ??? ?? ???? ???? ??? ????????? ??? ????? ????? ?????? ??? ??? ?? ????? ? ???? ??????? ?????? ???? ???? ???? CGM  ???? ??? Humalog ????? ????? ?? ?????? ??? ???? ?? ???????.  ???? ??? ?????? ?? ??????? ???????? ?? ???????? ?????? ?? ??????? ????  ???? ???? ????? ?? ???? ??????  Plan:  After getting new phone download the Dexcom G6 and Dexcom Clarity apps You need to create one user name and password that will work for both apps After you have both apps on your phone and you are able to open each one, call the office to make an appointment to start the CGM sample Pick up Humalog today and start taking again with meals. Get more strips and lancets at your pharmacy and continue to use your glucometer to check

## 2021-12-17 ENCOUNTER — Other Ambulatory Visit: Payer: Self-pay

## 2021-12-17 LAB — HEMOGLOBIN A1C
Est. average glucose Bld gHb Est-mCnc: 252 mg/dL
Hgb A1c MFr Bld: 10.4 % — ABNORMAL HIGH (ref 4.8–5.6)

## 2021-12-17 LAB — CBC/D/PLT+RPR+RH+ABO+RUBIGG...
Basophils Absolute: 0 10*3/uL (ref 0.0–0.2)
Basos: 0 %
EOS (ABSOLUTE): 0 10*3/uL (ref 0.0–0.4)
Eos: 1 %
HCV Ab: <0.1 s/co ratio (ref 0.0–0.9)
HIV Screen 4th Generation wRfx: NONREACTIVE
Hematocrit: 42.9 % (ref 34.0–46.6)
Hemoglobin: 14.1 g/dL (ref 11.1–15.9)
Hepatitis B Surface Ag: NEGATIVE
Immature Grans (Abs): 0 10*3/uL (ref 0.0–0.1)
Immature Granulocytes: 0 %
Lymphocytes Absolute: 0.9 10*3/uL (ref 0.7–3.1)
Lymphs: 30 %
MCH: 28.2 pg (ref 26.6–33.0)
MCHC: 32.9 g/dL (ref 31.5–35.7)
MCV: 86 fL (ref 79–97)
Monocytes Absolute: 0.2 10*3/uL (ref 0.1–0.9)
Monocytes: 7 %
Neutrophils Absolute: 1.9 10*3/uL (ref 1.4–7.0)
Neutrophils: 62 %
Platelets: 238 10*3/uL (ref 150–450)
RBC: 5 x10E6/uL (ref 3.77–5.28)
RDW: 12.5 % (ref 11.7–15.4)
RPR Ser Ql: NONREACTIVE
Rh Factor: NEGATIVE
Rubella Antibodies, IGG: 1.55 index (ref >0.99)
WBC: 3 10*3/uL — ABNORMAL LOW (ref 3.4–10.8)

## 2021-12-17 LAB — COMPREHENSIVE METABOLIC PANEL
ALT: 6 IU/L (ref 0–32)
AST: 6 IU/L (ref 0–40)
Albumin/Globulin Ratio: 1.3 (ref 1.2–2.2)
Albumin: 3.8 g/dL (ref 3.8–4.8)
Alkaline Phosphatase: 86 IU/L (ref 44–121)
BUN/Creatinine Ratio: 29 — ABNORMAL HIGH (ref 9–23)
BUN: 11 mg/dL (ref 6–20)
Bilirubin Total: 0.4 mg/dL (ref 0.0–1.2)
CO2: 18 mmol/L — ABNORMAL LOW (ref 20–29)
Calcium: 9 mg/dL (ref 8.7–10.2)
Chloride: 99 mmol/L (ref 96–106)
Creatinine, Ser: 0.38 mg/dL — ABNORMAL LOW (ref 0.57–1.00)
Globulin, Total: 3 g/dL (ref 1.5–4.5)
Glucose: 350 mg/dL — ABNORMAL HIGH (ref 70–99)
Potassium: 4.1 mmol/L (ref 3.5–5.2)
Sodium: 130 mmol/L — ABNORMAL LOW (ref 134–144)
Total Protein: 6.8 g/dL (ref 6.0–8.5)
eGFR: 133 mL/min/{1.73_m2} (ref >59)

## 2021-12-17 LAB — AB SCR+ANTIBODY ID: Antibody Screen: POSITIVE — AB

## 2021-12-17 LAB — TSH: TSH: 0.77 u[IU]/mL (ref 0.450–4.500)

## 2021-12-17 LAB — URINE CULTURE, OB REFLEX

## 2021-12-17 LAB — CULTURE, OB URINE

## 2021-12-17 LAB — HCV INTERPRETATION

## 2021-12-19 ENCOUNTER — Other Ambulatory Visit: Payer: Self-pay | Admitting: Family Medicine

## 2021-12-19 DIAGNOSIS — O10919 Unspecified pre-existing hypertension complicating pregnancy, unspecified trimester: Secondary | ICD-10-CM

## 2021-12-25 ENCOUNTER — Encounter: Payer: Self-pay | Admitting: *Deleted

## 2021-12-29 ENCOUNTER — Other Ambulatory Visit: Payer: Self-pay

## 2021-12-30 ENCOUNTER — Telehealth: Payer: Self-pay | Admitting: Lactation Services

## 2021-12-30 ENCOUNTER — Other Ambulatory Visit: Payer: Self-pay

## 2021-12-30 NOTE — Telephone Encounter (Signed)
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   Patient did not answer. LM for patient to call the office at [phone] for non urgent results. My Chart message sent.

## 2022-01-02 ENCOUNTER — Other Ambulatory Visit: Payer: Self-pay

## 2022-01-02 ENCOUNTER — Encounter: Payer: Self-pay | Admitting: Cardiology

## 2022-01-02 ENCOUNTER — Ambulatory Visit (INDEPENDENT_AMBULATORY_CARE_PROVIDER_SITE_OTHER): Payer: Medicaid Other | Admitting: Cardiology

## 2022-01-02 VITALS — BP 124/86 | HR 78 | Ht 62.0 in | Wt 120.2 lb

## 2022-01-02 DIAGNOSIS — R0789 Other chest pain: Secondary | ICD-10-CM | POA: Diagnosis not present

## 2022-01-02 DIAGNOSIS — R0602 Shortness of breath: Secondary | ICD-10-CM

## 2022-01-02 DIAGNOSIS — O10919 Unspecified pre-existing hypertension complicating pregnancy, unspecified trimester: Secondary | ICD-10-CM | POA: Diagnosis not present

## 2022-01-02 DIAGNOSIS — O0990 Supervision of high risk pregnancy, unspecified, unspecified trimester: Secondary | ICD-10-CM

## 2022-01-02 DIAGNOSIS — Z9189 Other specified personal risk factors, not elsewhere classified: Secondary | ICD-10-CM

## 2022-01-02 DIAGNOSIS — E1039 Type 1 diabetes mellitus with other diabetic ophthalmic complication: Secondary | ICD-10-CM

## 2022-01-02 DIAGNOSIS — H538 Other visual disturbances: Secondary | ICD-10-CM | POA: Diagnosis not present

## 2022-01-02 NOTE — Patient Instructions (Addendum)
Medication Instructions:  Your physician recommends that you continue on your current medications as directed. Please refer to the Current Medication list given to you today.  *If you need a refill on your cardiac medications before your next appointment, please call your pharmacy*   Lab Work: None If you have labs (blood work) drawn today and your tests are completely normal, you will receive your results only by: MyChart Message (if you have MyChart) OR A paper copy in the mail If you have any lab test that is abnormal or we need to change your treatment, we will call you to review the results.   Testing/Procedures: Your physician has requested that you have an echocardiogram. Echocardiography is a painless test that uses sound waves to create images of your heart. It provides your doctor with information about the size and shape of your heart and how well your hearts chambers and valves are working. This procedure takes approximately one hour. There are no restrictions for this procedure.    Follow-Up: At [facility], you and your health needs are our priority.  As part of our continuing mission to provide you with exceptional heart care, we have created designated Provider Care Teams.  These Care Teams include your primary Cardiologist (physician) and Advanced Practice Providers (APPs -  Physician Assistants and Nurse Practitioners) who all work together to provide you with the care you need, when you need it.  We recommend signing up for the patient portal called "MyChart".  Sign up information is provided on this After Visit Summary.  MyChart is used to connect with patients for Virtual Visits (Telemedicine).  Patients are able to view lab/test results, encounter notes, upcoming appointments, etc.  Non-urgent messages can be sent to your provider as well.   To learn more about what you can do with MyChart, go to [email or web address].    Your next appointment:   16 week(s)  The  format for your next appointment:   In Person  Provider:   Thomasene Ripple  [facility] [address] [place], [place] [address]     Other Instructions  Please send a MyChart message if your blood pressure is greater than 140/90 mmHg.

## 2022-01-02 NOTE — Progress Notes (Signed)
Cardio-Obstetrics Clinic  New Evaluation  Date:  01/04/2022   ID:  Kristin Clay, DOB [birth year], MRN [id number]  PCP:  Dorna Mai, MD   [facility] Providers Cardiologist:  Berniece Salines, DO  Electrophysiologist:  None       Referring MD: Osborne Oman, MD   Chief Complaint: " I feel short of breath and is getting worse"  History of Present Illness:    Kristin Clay is a 36 y.o. female [[name]] who is being seen today for the evaluation of shortness of breath and hypertension at the request of Anyanwu, Sallyanne Havers, MD.   Medical history includes type 1 diabetes which was diagnosed at the age of 52 in her home country in [place] she has been on insulin since, hypertension was previously on losartan hydrochlorothiazide but has been transitioned to nifedipine since she became pregnant, history of severe preeclampsia was referred to the cardio obstetrics clinic for cardiovascular care during pregnancy and assessment of cardiovascular long-term risk.  Today she complains of some symptoms.  She notes that she has been significantly short of breath which started very early in the pregnancy and is getting worse.  In addition she complains of blurry vision.  She tells me she has not been evaluated by ophthalmology.  Prior CV Studies Reviewed: The following studies were reviewed today:   Past Medical History:  Diagnosis Date   Chronic hypertension    Chronic hypertension with superimposed severe preeclampsia 60/08/9322   Complication of anesthesia    with hand surgery, local anes did not work, tried twice- had to put her to sleep   Diabetes mellitus type 1 ([place])    DKA (diabetic ketoacidosis) ([place]) 10/2020   Epidural hematoma 07/04/2021   GBS bacteriuria 06/09/2021   Obstetric vaginal laceration with type 3c third degree perineal laceration 06/29/2021   Poor fetal growth complicating pregnancy, antepartum, third trimester, other fetus 06/19/2021    Pyelonephritis 10/2020   Rh negative status during pregnancy 05/01/2021   s/p Rhogam 05/13/21   Type 1 diabetes mellitus with ketoacidosis, uncontrolled ([place]) 02/22/2020   Type I diabetes mellitus ([place])    dx 28yr ago   UTI (urinary tract infection)     Past Surgical History:  Procedure Laterality Date   female circumcision Bilateral    clitorectomy as well   FOOT SURGERY  2018   HAND SURGERY  2019      OB History     Gravida  3   Para  1   Term  0   Preterm  1   AB  1   Living  1      SAB  1   IAB  0   Ectopic  0   Multiple  0   Live Births  1               Current Medications: Current Meds  Medication Sig   Accu-Chek Softclix Lancets lancets Use as instructed; check blood glucose 4 times daily   acetaminophen (TYLENOL) 500 MG tablet Take 1,000 mg by mouth every 6 (six) hours as needed.   ASPIRIN LOW DOSE 81 MG chewable tablet CHEW 1 TABLET BY MOUTH DAILY.   blood glucose meter kit and supplies Dispense based on patient and insurance preference. Use up to four times daily as directed. (FOR ICD-10 E10.9, E11.9).   glucose blood (ACCU-CHEK GUIDE) test strip Use as instructed; check blood glucose 4 times daily   insulin glargine (LANTUS SOLOSTAR) 100 UNIT/ML  Solostar Pen Inject 24 Units into the skin daily.   insulin lispro (HUMALOG KWIKPEN) 100 UNIT/ML KwikPen Inject 8 Units into the skin 2 (two) times daily.   Insulin Pen Needle (TECHLITE PEN NEEDLES) 32G X 4 MM MISC Use to inject insulin daily.   labetalol (NORMODYNE) 100 MG tablet Take 100 mg by mouth 2 (two) times daily.   NIFEdipine (ADALAT CC) 90 MG 24 hr tablet Take 90 mg by mouth daily.   Prenatal Vit-Fe Fumarate-FA (PRENATAL VITAMIN) 27-0.8 MG TABS Take 1 tablet by mouth daily.   terconazole (TERAZOL 7) 0.4 % vaginal cream Place 1 applicator vaginally at bedtime.     Allergies:   Pork-derived products   Social History   Socioeconomic History   Marital status: Married    Spouse name: Not  on file   Number of children: Not on file   Years of education: Not on file   Highest education level: Not on file  Occupational History   Not on file  Tobacco Use   Smoking status: Never   Smokeless tobacco: Never  Vaping Use   Vaping Use: Never used  Substance and Sexual Activity   Alcohol use: Never   Drug use: Never   Sexual activity: Yes    Birth control/protection: None  Other Topics Concern   Not on file  Social History Narrative   ** Merged History Encounter **       ** Merged History Encounter **       Social Determinants of Health   Financial Resource Strain: High Risk   Difficulty of Paying Living Expenses: Very hard  Food Insecurity: No Food Insecurity   Worried About Charity fundraiser in the Last Year: Never true   Ran Out of Food in the Last Year: Never true  Transportation Needs: No Transportation Needs   Lack of Transportation (Medical): No   Lack of Transportation (Non-Medical): No  Physical Activity: Not on file  Stress: Not on file  Social Connections: Not on file      Family History  Problem Relation Age of Onset   Hyperlipidemia Mother    Hypertension Mother    Kidney disease Father    Alzheimer's disease Father       ROS:   Please see the history of present illness.     All other systems reviewed and are negative.   Labs/EKG Reviewed:    EKG:   EKG is was not ordered today.    Recent Labs: 12/15/2021: ALT 6; BUN 11; Creatinine, Ser 0.38; Hemoglobin 14.1; Platelets 238; Potassium 4.1; Sodium 130; TSH 0.770   Recent Lipid Panel Lab Results  Component Value Date/Time   CHOL 179 10/29/2019 02:43 AM   TRIG 77 10/29/2019 02:43 AM   HDL 35 (L) 10/29/2019 02:43 AM   CHOLHDL 5.1 10/29/2019 02:43 AM   LDLCALC 129 (H) 10/29/2019 02:43 AM    Physical Exam:    VS:  BP 124/86    Pulse 78    Ht '5\' 2"'  (1.575 m)    Wt 120 lb 3.2 oz (54.5 kg)    LMP 09/20/2021    SpO2 99%    BMI 21.98 kg/m     Wt Readings from Last 3 Encounters:   01/02/22 120 lb 3.2 oz (54.5 kg)  12/15/21 120 lb 14.4 oz (54.8 kg)  12/09/21 123 lb 6.4 oz (56 kg)     GEN:  Well nourished, well developed in no acute distress HEENT: Normal NECK: No JVD; No  carotid bruits LYMPHATICS: No lymphadenopathy CARDIAC: RRR, no murmurs, rubs, gallops RESPIRATORY:  Clear to auscultation without rales, wheezing or rhonchi  ABDOMEN: Soft, non-tender, non-distended MUSCULOSKELETAL:  No edema; No deformity  SKIN: Warm and dry NEUROLOGIC:  Alert and oriented x 3 PSYCHIATRIC:  Normal affect    Risk Assessment/Risk Calculators:     CARPREG II Risk Prediction Index Score:  1.  The patient's risk for a primary cardiac event is 5%.            ASSESSMENT & PLAN:    Chronic hypertension in pregnancy Type 1 diabetes Shortness of breath High risk pregnancy Health education   Was able to explain to the patient about her cardiovascular risk factors including the hypertension, diabetes, history of preeclampsia and how it affects her heart in the long-term. Her blood pressure deceptively in the office today.  We will not change any medications.  But I have asked the patient to take her blood pressure daily she notes that she does have a blood pressure cuff at home. She is following with the diabetes educator associated with the OB/GYN group. She needs to be on aspirin 81 mg daily.  We will start this with the patient. She is got some blurry vision and has not seen ophthalmology or refer her at this time.  We will follow patient closely during her pregnancy in cardio obstetrics clinic.   Patient Instructions  Medication Instructions:  Your physician recommends that you continue on your current medications as directed. Please refer to the Current Medication list given to you today.  *If you need a refill on your cardiac medications before your next appointment, please call your pharmacy*   Lab Work: None If you have labs (blood work) drawn today and your  tests are completely normal, you will receive your results only by: [place] (if you have MyChart) OR A paper copy in the mail If you have any lab test that is abnormal or we need to change your treatment, we will call you to review the results.   Testing/Procedures: Your physician has requested that you have an echocardiogram. Echocardiography is a painless test that uses sound waves to create images of your heart. It provides your doctor with information about the size and shape of your heart and how well your hearts chambers and valves are working. This procedure takes approximately one hour. There are no restrictions for this procedure.    Follow-Up: At [facility], you and your health needs are our priority.  As part of our continuing mission to provide you with exceptional heart care, we have created designated Provider Care Teams.  These Care Teams include your primary Cardiologist (physician) and Advanced Practice Providers (APPs -  Physician Assistants and Nurse Practitioners) who all work together to provide you with the care you need, when you need it.  We recommend signing up for the patient portal called "MyChart".  Sign up information is provided on this After Visit Summary.  MyChart is used to connect with patients for Virtual Visits (Telemedicine).  Patients are able to view lab/test results, encounter notes, upcoming appointments, etc.  Non-urgent messages can be sent to your provider as well.   To learn more about what you can do with MyChart, go to [email or web address].    Your next appointment:   16 week(s)  The format for your next appointment:   In Person  Provider:   Berniece Salines  [facility] [address], [place], [place] [address]  Other Instructions  Please send a MyChart message if your blood pressure is greater than 140/90 mmHg.    Dispo:  No follow-ups on file.   Medication Adjustments/Labs and Tests Ordered: Current medicines  are reviewed at length with the patient today.  Concerns regarding medicines are outlined above.  Tests Ordered: Orders Placed This Encounter  Procedures   Ambulatory referral to Ophthalmology   EKG 12-Lead   ECHOCARDIOGRAM COMPLETE   Medication Changes: No orders of the defined types were placed in this encounter.

## 2022-01-02 NOTE — Progress Notes (Signed)
p 

## 2022-01-04 DIAGNOSIS — O10919 Unspecified pre-existing hypertension complicating pregnancy, unspecified trimester: Secondary | ICD-10-CM | POA: Insufficient documentation

## 2022-01-04 DIAGNOSIS — H538 Other visual disturbances: Secondary | ICD-10-CM | POA: Insufficient documentation

## 2022-01-04 DIAGNOSIS — R0602 Shortness of breath: Secondary | ICD-10-CM | POA: Insufficient documentation

## 2022-01-04 DIAGNOSIS — R0789 Other chest pain: Secondary | ICD-10-CM | POA: Insufficient documentation

## 2022-01-04 DIAGNOSIS — E1039 Type 1 diabetes mellitus with other diabetic ophthalmic complication: Secondary | ICD-10-CM | POA: Insufficient documentation

## 2022-01-05 ENCOUNTER — Ambulatory Visit (INDEPENDENT_AMBULATORY_CARE_PROVIDER_SITE_OTHER): Payer: Medicaid Other | Admitting: Obstetrics and Gynecology

## 2022-01-05 ENCOUNTER — Inpatient Hospital Stay (HOSPITAL_COMMUNITY)
Admission: AD | Admit: 2022-01-05 | Discharge: 2022-01-07 | DRG: 831 | Disposition: A | Discharge status: Home / Self Care | Payer: Medicaid Other | Attending: Obstetrics & Gynecology | Admitting: Obstetrics & Gynecology

## 2022-01-05 ENCOUNTER — Other Ambulatory Visit: Payer: Self-pay

## 2022-01-05 ENCOUNTER — Encounter (HOSPITAL_COMMUNITY): Payer: Self-pay | Admitting: Family Medicine

## 2022-01-05 VITALS — BP 158/99 | HR 111 | Wt 112.1 lb

## 2022-01-05 DIAGNOSIS — E1039 Type 1 diabetes mellitus with other diabetic ophthalmic complication: Secondary | ICD-10-CM

## 2022-01-05 DIAGNOSIS — Z20822 Contact with and (suspected) exposure to covid-19: Secondary | ICD-10-CM | POA: Diagnosis present

## 2022-01-05 DIAGNOSIS — D563 Thalassemia minor: Secondary | ICD-10-CM

## 2022-01-05 DIAGNOSIS — O211 Hyperemesis gravidarum with metabolic disturbance: Secondary | ICD-10-CM | POA: Diagnosis present

## 2022-01-05 DIAGNOSIS — E876 Hypokalemia: Secondary | ICD-10-CM | POA: Diagnosis present

## 2022-01-05 DIAGNOSIS — Z3A15 15 weeks gestation of pregnancy: Secondary | ICD-10-CM

## 2022-01-05 DIAGNOSIS — O10919 Unspecified pre-existing hypertension complicating pregnancy, unspecified trimester: Secondary | ICD-10-CM | POA: Diagnosis not present

## 2022-01-05 DIAGNOSIS — O09522 Supervision of elderly multigravida, second trimester: Secondary | ICD-10-CM

## 2022-01-05 DIAGNOSIS — O24012 Pre-existing diabetes mellitus, type 1, in pregnancy, second trimester: Secondary | ICD-10-CM | POA: Diagnosis not present

## 2022-01-05 DIAGNOSIS — E1010 Type 1 diabetes mellitus with ketoacidosis without coma: Secondary | ICD-10-CM

## 2022-01-05 DIAGNOSIS — O0990 Supervision of high risk pregnancy, unspecified, unspecified trimester: Secondary | ICD-10-CM

## 2022-01-05 DIAGNOSIS — K3184 Gastroparesis: Secondary | ICD-10-CM | POA: Diagnosis present

## 2022-01-05 DIAGNOSIS — O10019 Pre-existing essential hypertension complicating pregnancy, unspecified trimester: Secondary | ICD-10-CM | POA: Diagnosis not present

## 2022-01-05 DIAGNOSIS — E1043 Type 1 diabetes mellitus with diabetic autonomic (poly)neuropathy: Secondary | ICD-10-CM | POA: Diagnosis not present

## 2022-01-05 DIAGNOSIS — Z794 Long term (current) use of insulin: Secondary | ICD-10-CM | POA: Diagnosis not present

## 2022-01-05 DIAGNOSIS — O10012 Pre-existing essential hypertension complicating pregnancy, second trimester: Secondary | ICD-10-CM | POA: Diagnosis present

## 2022-01-05 DIAGNOSIS — O24013 Pre-existing diabetes mellitus, type 1, in pregnancy, third trimester: Secondary | ICD-10-CM | POA: Diagnosis present

## 2022-01-05 LAB — GLUCOSE, CAPILLARY
Glucose-Capillary: 106 mg/dL — ABNORMAL HIGH (ref 70–99)
Glucose-Capillary: 138 mg/dL — ABNORMAL HIGH (ref 70–99)
Glucose-Capillary: 141 mg/dL — ABNORMAL HIGH (ref 70–99)
Glucose-Capillary: 164 mg/dL — ABNORMAL HIGH (ref 70–99)
Glucose-Capillary: 172 mg/dL — ABNORMAL HIGH (ref 70–99)
Glucose-Capillary: 179 mg/dL — ABNORMAL HIGH (ref 70–99)
Glucose-Capillary: 185 mg/dL — ABNORMAL HIGH (ref 70–99)
Glucose-Capillary: 229 mg/dL — ABNORMAL HIGH (ref 70–99)
Glucose-Capillary: 234 mg/dL — ABNORMAL HIGH (ref 70–99)
Glucose-Capillary: 240 mg/dL — ABNORMAL HIGH (ref 70–99)
Glucose-Capillary: 273 mg/dL — ABNORMAL HIGH (ref 70–99)
Glucose-Capillary: 306 mg/dL — ABNORMAL HIGH (ref 70–99)
Glucose-Capillary: 96 mg/dL (ref 70–99)

## 2022-01-05 LAB — BETA-HYDROXYBUTYRIC ACID
Beta-Hydroxybutyric Acid: 5.05 mmol/L — ABNORMAL HIGH (ref 0.05–0.27)
Beta-Hydroxybutyric Acid: 0.1 mmol/L (ref 0.05–0.27)

## 2022-01-05 LAB — URINALYSIS, ROUTINE W REFLEX MICROSCOPIC
Bacteria, UA: NONE SEEN
Bilirubin Urine: NEGATIVE
Glucose, UA: >=500 mg/dL — AB
Ketones, ur: 80 mg/dL — AB
Leukocytes,Ua: NEGATIVE
Nitrite: NEGATIVE
Protein, ur: >=300 mg/dL — AB
Specific Gravity, Urine: 1.028 (ref 1.005–1.030)
pH: 5 (ref 5.0–8.0)

## 2022-01-05 LAB — COMPREHENSIVE METABOLIC PANEL
ALT: 11 U/L (ref 0–44)
AST: 9 U/L — ABNORMAL LOW (ref 15–41)
Albumin: 3.6 g/dL (ref 3.5–5.0)
Alkaline Phosphatase: 76 U/L (ref 38–126)
Anion gap: 17 — ABNORMAL HIGH (ref 5–15)
BUN: 16 mg/dL (ref 6–20)
CO2: 15 mmol/L — ABNORMAL LOW (ref 22–32)
Calcium: 9 mg/dL (ref 8.9–10.3)
Chloride: 102 mmol/L (ref 98–111)
Creatinine, Ser: 0.68 mg/dL (ref 0.44–1.00)
GFR, Estimated: >60 mL/min (ref >60)
Glucose, Bld: 335 mg/dL — ABNORMAL HIGH (ref 70–99)
Potassium: 3.7 mmol/L (ref 3.5–5.1)
Sodium: 134 mmol/L — ABNORMAL LOW (ref 135–145)
Total Bilirubin: 1 mg/dL (ref 0.3–1.2)
Total Protein: 8.1 g/dL (ref 6.5–8.1)

## 2022-01-05 LAB — RESP PANEL BY RT-PCR (FLU A&B, COVID) ARPGX2
Influenza A by PCR: NEGATIVE
Influenza B by PCR: NEGATIVE
SARS Coronavirus 2 by RT PCR: NEGATIVE

## 2022-01-05 LAB — BASIC METABOLIC PANEL
Anion gap: 11 (ref 5–15)
Anion gap: 8 (ref 5–15)
BUN: 11 mg/dL (ref 6–20)
BUN: 9 mg/dL (ref 6–20)
CO2: 17 mmol/L — ABNORMAL LOW (ref 22–32)
CO2: 19 mmol/L — ABNORMAL LOW (ref 22–32)
Calcium: 8.4 mg/dL — ABNORMAL LOW (ref 8.9–10.3)
Calcium: 8.2 mg/dL — ABNORMAL LOW (ref 8.9–10.3)
Chloride: 109 mmol/L (ref 98–111)
Chloride: 109 mmol/L (ref 98–111)
Creatinine, Ser: 0.5 mg/dL (ref 0.44–1.00)
Creatinine, Ser: 0.48 mg/dL (ref 0.44–1.00)
GFR, Estimated: >60 mL/min (ref >60)
GFR, Estimated: >60 mL/min (ref >60)
Glucose, Bld: 183 mg/dL — ABNORMAL HIGH (ref 70–99)
Glucose, Bld: 132 mg/dL — ABNORMAL HIGH (ref 70–99)
Potassium: 3.3 mmol/L — ABNORMAL LOW (ref 3.5–5.1)
Potassium: 3.6 mmol/L (ref 3.5–5.1)
Sodium: 137 mmol/L (ref 135–145)
Sodium: 136 mmol/L (ref 135–145)

## 2022-01-05 LAB — CBC
HCT: 48 % — ABNORMAL HIGH (ref 36.0–46.0)
Hemoglobin: 15.6 g/dL — ABNORMAL HIGH (ref 12.0–15.0)
MCH: 27.8 pg (ref 26.0–34.0)
MCHC: 32.5 g/dL (ref 30.0–36.0)
MCV: 85.4 fL (ref 80.0–100.0)
Platelets: 283 10*3/uL (ref 150–400)
RBC: 5.62 MIL/uL — ABNORMAL HIGH (ref 3.87–5.11)
RDW: 12.9 % (ref 11.5–15.5)
WBC: 7.7 10*3/uL (ref 4.0–10.5)
nRBC: 0 % (ref 0.0–0.2)

## 2022-01-05 LAB — TYPE AND SCREEN
ABO/RH(D): A NEG
Antibody Screen: POSITIVE

## 2022-01-05 LAB — LACTIC ACID, PLASMA
Lactic Acid, Venous: 2.2 mmol/L (ref 0.5–1.9)
Lactic Acid, Venous: 2.2 mmol/L (ref 0.5–1.9)

## 2022-01-05 LAB — LIPASE, BLOOD: Lipase: 27 U/L (ref 11–51)

## 2022-01-05 MED ORDER — LACTATED RINGERS IV BOLUS
1000.0000 mL | Freq: Once | INTRAVENOUS | Status: AC
Start: 2022-01-05 — End: 2022-01-05
  Administered 2022-01-05: 1000 mL via INTRAVENOUS

## 2022-01-05 MED ORDER — INSULIN REGULAR(HUMAN) IN NACL 100-0.9 UT/100ML-% IV SOLN
INTRAVENOUS | Status: DC
  Administered 2022-01-05: 4.6 [IU]/h via INTRAVENOUS
  Filled 2022-01-05 (×2): qty 100

## 2022-01-05 MED ORDER — METOCLOPRAMIDE HCL 5 MG/ML IJ SOLN
10.0000 mg | Freq: Four times a day (QID) | INTRAMUSCULAR | Status: DC
  Administered 2022-01-05 – 2022-01-07 (×7): 10 mg via INTRAVENOUS
  Filled 2022-01-05 (×7): qty 2

## 2022-01-05 MED ORDER — LABETALOL HCL 100 MG PO TABS
100.0000 mg | ORAL_TABLET | Freq: Two times a day (BID) | ORAL | Status: DC
Start: 2022-01-05 — End: 2022-01-07
  Administered 2022-01-05 – 2022-01-07 (×4): 100 mg via ORAL
  Filled 2022-01-05 (×4): qty 1

## 2022-01-05 MED ORDER — POTASSIUM CHLORIDE 10 MEQ/100ML IV SOLN
10.0000 meq | INTRAVENOUS | Status: AC
  Administered 2022-01-05 (×2): 10 meq via INTRAVENOUS
  Filled 2022-01-05 (×4): qty 100

## 2022-01-05 MED ORDER — ONDANSETRON HCL 4 MG/2ML IJ SOLN
4.0000 mg | Freq: Once | INTRAMUSCULAR | Status: AC
  Administered 2022-01-05: 4 mg via INTRAVENOUS
  Filled 2022-01-05: qty 2

## 2022-01-05 MED ORDER — DEXTROSE IN LACTATED RINGERS 5 % IV SOLN: INTRAVENOUS | Status: DC

## 2022-01-05 MED ORDER — PANTOPRAZOLE SODIUM 40 MG IV SOLR
40.0000 mg | Freq: Once | INTRAVENOUS | Status: AC
  Administered 2022-01-05: 40 mg via INTRAVENOUS
  Filled 2022-01-05: qty 10

## 2022-01-05 MED ORDER — NIFEDIPINE ER OSMOTIC RELEASE 60 MG PO TB24
90.0000 mg | ORAL_TABLET | Freq: Every day | ORAL | Status: DC
  Administered 2022-01-05 – 2022-01-07 (×3): 90 mg via ORAL
  Filled 2022-01-05 (×3): qty 1

## 2022-01-05 MED ORDER — DEXTROSE 50 % IV SOLN: 0.0000 mL | INTRAVENOUS | Status: DC | PRN

## 2022-01-05 MED ORDER — ONDANSETRON HCL 4 MG/2ML IJ SOLN
4.0000 mg | Freq: Four times a day (QID) | INTRAMUSCULAR | Status: DC | PRN
  Administered 2022-01-06: 4 mg via INTRAVENOUS
  Filled 2022-01-05: qty 2

## 2022-01-05 MED ORDER — METOCLOPRAMIDE HCL 5 MG/ML IJ SOLN
10.0000 mg | Freq: Once | INTRAMUSCULAR | Status: AC
  Administered 2022-01-05: 10 mg via INTRAVENOUS
  Filled 2022-01-05: qty 2

## 2022-01-05 MED ORDER — ASPIRIN 81 MG PO CHEW
81.0000 mg | CHEWABLE_TABLET | Freq: Every day | ORAL | Status: DC
  Administered 2022-01-05 – 2022-01-07 (×3): 81 mg via ORAL
  Filled 2022-01-05 (×3): qty 1

## 2022-01-05 MED ORDER — LACTATED RINGERS IV SOLN: INTRAVENOUS | Status: DC

## 2022-01-05 MED ORDER — LACTATED RINGERS IV BOLUS
1000.0000 mL | Freq: Once | INTRAVENOUS | Status: AC
  Administered 2022-01-05: 1000 mL via INTRAVENOUS

## 2022-01-05 NOTE — Progress Notes (Addendum)
Inpatient Diabetes Program Recommendations  AACE/ADA: New Consensus Statement on Inpatient Glycemic Control (2015)  Target Ranges:  Prepandial:   less than 140 mg/dL      Peak postprandial:   less than 180 mg/dL (1-2 hours)      Critically ill patients:  140 - 180 mg/dL   Lab Results  Component Value Date   GLUCAP 273 (H) 01/05/2022   HGBA1C 10.4 (H) 12/15/2021    Review of Glycemic Control  Diabetes history: DM 1 Outpatient Diabetes medications:  Prior to pregnancy: Lantus 20 units, Humalog 5 units tid (previously on Novolog 70/30 45 units qam, 15 units qpm) Pregnancy: Lantus 24 units, Humalog 8 units tid Current orders for Inpatient glycemic control: IV insulin/ Endotool/DKA  A1c 10.4% on 1/23  Spoke with pt at bedside with the assistance of interpreter May # [id number]. Pt reports compliance with insulin, except for Humalog at mealtimes. Pt reports being without test strips for her meter. Discussed sick day guidelines and importance fo checking glucose. Encouraged pt to call her doctor for strips or get an over the counter meter at the pharmacy at [facility] and start checking her glucose. Discussed glucose goals for pregnancy.  Thanks,  Christena Deem RN, MSN, BC-ADM Inpatient Diabetes Coordinator Team Pager [phone] (8a-5p)

## 2022-01-05 NOTE — MAU Note (Signed)
Diabetes Coordinator at bedside. °

## 2022-01-05 NOTE — H&P (Addendum)
History     CSN: [id number]  Arrival date and time: 01/05/22 1124   Event Date/Time   First Provider Initiated Contact with Patient 01/05/22 1153      Chief Complaint  Patient presents with   Nausea   HPI This is a 36 yo [name] at 55w2dwith a pregnancy complicated by type 1 diabetes, chronic hypertension.  She has been vomiting for 4 days and has been intolerant of oral foods.  She has been able to keep some liquids down.  She has not been checking her blood sugar for the past week.  She reports taking her long-acting insulin, but has not been taking her short acting insulin.  She reports having epigastric and left upper quadrant abdominal pain that is sharp and nonradiating.  She was seen in the office earlier today and they checked her blood sugar and it was 300.  She was sent over for evaluation and possible admission  OB History     Gravida  3   Para  1   Term  0   Preterm  1   AB  1   Living  1      SAB  1   IAB  0   Ectopic  0   Multiple  0   Live Births  1           Past Medical History:  Diagnosis Date   Chronic hypertension    Chronic hypertension with superimposed severe preeclampsia 056/21/3086  Complication of anesthesia    with hand surgery, local anes did not work, tried twice- had to put her to sleep   Diabetes mellitus type 1 ([place]    DKA (diabetic ketoacidosis) ([place] 10/2020   Epidural hematoma 07/04/2021   GBS bacteriuria 06/09/2021   Obstetric vaginal laceration with type 3c third degree perineal laceration 06/29/2021   Poor fetal growth complicating pregnancy, antepartum, third trimester, other fetus 06/19/2021   Pyelonephritis 10/2020   Rh negative status during pregnancy 05/01/2021   s/p Rhogam 05/13/21   Type 1 diabetes mellitus with ketoacidosis, uncontrolled ([place] 02/22/2020   Type I diabetes mellitus ([place]    dx [place]   UTI (urinary tract infection)     Past Surgical History:  Procedure Laterality Date   female  circumcision Bilateral    clitorectomy as well   FOOT SURGERY  2018   HAND SURGERY  2019    Family History  Problem Relation Age of Onset   Hyperlipidemia Mother    Hypertension Mother    Kidney disease Father    Alzheimer's disease Father     Social History   Tobacco Use   Smoking status: Never   Smokeless tobacco: Never  Vaping Use   Vaping Use: Never used  Substance Use Topics   Alcohol use: Never   Drug use: Never    Allergies:  Allergies  Allergen Reactions   Pork-Derived Products     Pt does not want any pork products    Medications Prior to Admission  Medication Sig Dispense Refill Last Dose   Accu-Chek Softclix Lancets lancets Use as instructed; check blood glucose 4 times daily 100 each 12    acetaminophen (TYLENOL) 500 MG tablet Take 1,000 mg by mouth every 6 (six) hours as needed.      ASPIRIN LOW DOSE 81 MG chewable tablet CHEW 1 TABLET BY MOUTH DAILY. 90 tablet 1    blood glucose meter kit and supplies Dispense based on patient and insurance preference.  Use up to four times daily as directed. (FOR ICD-10 E10.9, E11.9). 1 each 0    glucose blood (ACCU-CHEK GUIDE) test strip Use as instructed; check blood glucose 4 times daily 100 each 12    insulin glargine (LANTUS SOLOSTAR) 100 UNIT/ML Solostar Pen Inject 24 Units into the skin daily. 15 mL 3    insulin lispro (HUMALOG KWIKPEN) 100 UNIT/ML KwikPen Inject 8 Units into the skin 2 (two) times daily. 15 mL 2    Insulin Pen Needle (TECHLITE PEN NEEDLES) 32G X 4 MM MISC Use to inject insulin daily. 100 each 2    labetalol (NORMODYNE) 100 MG tablet Take 100 mg by mouth 2 (two) times daily.      NIFEdipine (ADALAT CC) 90 MG 24 hr tablet Take 90 mg by mouth daily.      Prenatal Vit-Fe Fumarate-FA (PRENATAL VITAMIN) 27-0.8 MG TABS Take 1 tablet by mouth daily. 90 tablet 2    terconazole (TERAZOL 7) 0.4 % vaginal cream Place 1 applicator vaginally at bedtime. 45 g 0     Review of Systems Physical Exam   Blood  pressure 135/89, pulse (!) 114, temperature 98.3 F (36.8 C), temperature source Oral, resp. rate 16, last menstrual period 09/20/2021, SpO2 100 %, unknown if currently breastfeeding.  Physical Exam Vitals reviewed.  Constitutional:      Appearance: Normal appearance.  HENT:     Head: Normocephalic and atraumatic.  Cardiovascular:     Rate and Rhythm: Normal rate and regular rhythm.  Pulmonary:     Effort: Pulmonary effort is normal.     Breath sounds: Normal breath sounds.  Abdominal:     General: Abdomen is flat.     Palpations: Abdomen is soft.  Skin:    General: Skin is warm and dry.     Capillary Refill: Capillary refill takes less than 2 seconds.  Neurological:     General: No focal deficit present.     Mental Status: She is alert.  Psychiatric:        Mood and Affect: Mood normal.        Behavior: Behavior normal.        Thought Content: Thought content normal.        Judgment: Judgment normal.   Results for orders placed or performed during the hospital encounter of 01/05/22 (from the past 24 hour(s))  Urinalysis, Routine w reflex microscopic Urine, Clean Catch     Status: Abnormal   Collection Time: 01/05/22 11:50 AM  Result Value Ref Range   Color, Urine YELLOW YELLOW   APPearance HAZY (A) CLEAR   Specific Gravity, Urine 1.028 1.005 - 1.030   pH 5.0 5.0 - 8.0   Glucose, UA >=500 (A) NEGATIVE mg/dL   Hgb urine dipstick SMALL (A) NEGATIVE   Bilirubin Urine NEGATIVE NEGATIVE   Ketones, ur 80 (A) NEGATIVE mg/dL   Protein, ur >=300 (A) NEGATIVE mg/dL   Nitrite NEGATIVE NEGATIVE   Leukocytes,Ua NEGATIVE NEGATIVE   RBC / HPF 0-5 0 - 5 RBC/hpf   WBC, UA 0-5 0 - 5 WBC/hpf   Bacteria, UA NONE SEEN NONE SEEN   Squamous Epithelial / LPF 0-5 0 - 5   Mucus PRESENT    Hyaline Casts, UA PRESENT    Granular Casts, UA PRESENT   Glucose, capillary     Status: Abnormal   Collection Time: 01/05/22 12:00 PM  Result Value Ref Range   Glucose-Capillary 273 (H) 70 - 99 mg/dL   Comprehensive metabolic panel  Status: Abnormal   Collection Time: 01/05/22 12:10 PM  Result Value Ref Range   Sodium 134 (L) 135 - 145 mmol/L   Potassium 3.7 3.5 - 5.1 mmol/L   Chloride 102 98 - 111 mmol/L   CO2 15 (L) 22 - 32 mmol/L   Glucose, Bld 335 (H) 70 - 99 mg/dL   BUN 16 6 - 20 mg/dL   Creatinine, Ser 0.68 0.44 - 1.00 mg/dL   Calcium 9.0 8.9 - 10.3 mg/dL   Total Protein 8.1 6.5 - 8.1 g/dL   Albumin 3.6 3.5 - 5.0 g/dL   AST 9 (L) 15 - 41 U/L   ALT 11 0 - 44 U/L   Alkaline Phosphatase 76 38 - 126 U/L   Total Bilirubin 1.0 0.3 - 1.2 mg/dL   GFR, Estimated >60 >60 mL/min   Anion gap 17 (H) 5 - 15  CBC     Status: Abnormal   Collection Time: 01/05/22 12:10 PM  Result Value Ref Range   WBC 7.7 4.0 - 10.5 K/uL   RBC 5.62 (H) 3.87 - 5.11 MIL/uL   Hemoglobin 15.6 (H) 12.0 - 15.0 g/dL   HCT 48.0 (H) 36.0 - 46.0 %   MCV 85.4 80.0 - 100.0 fL   MCH 27.8 26.0 - 34.0 pg   MCHC 32.5 30.0 - 36.0 g/dL   RDW 12.9 11.5 - 15.5 %   Platelets 283 150 - 400 K/uL   nRBC 0.0 0.0 - 0.2 %  Type and screen [place]     Status: None (Preliminary result)   Collection Time: 01/05/22 12:10 PM  Result Value Ref Range   ABO/RH(D) A NEG    Antibody Screen PENDING    Sample Expiration      01/08/2022,2359 Performed at [facility], [address] [place], [place] [address]      MAU Course  Procedures  MDM Anion gap of 17. Insulin drip initiated. Will admit.   Assessment and Plan   1. [redacted] weeks gestation of pregnancy   2. Type 1 diabetes mellitus with ketoacidosis without coma ([facility] [place])   3. HTN in pregnancy, chronic    Admit Insulin drip BMP q4hr BHA q8hr KCl x2 runs Initiate D5LR when CBG hits 250.  Antiemetics: Zofran, reglan.  When transitioning to levemir instead of lantus whenever transitioning off insulin drip to outpt insulin.  Truett Mainland 01/05/2022, 1:29 PM

## 2022-01-05 NOTE — MAU Note (Signed)
Kristin Clay is a 36 y.o. at [redacted]w[redacted]d here in MAU reporting: was sent from the office, states has been vomiting for 4 days and has not been eating well. No pain, bleeding, or discharge.   Onset of complaint: ongoing  Pain score: 0/10  Vitals:   01/05/22 1138  BP: (!) 137/91  Pulse: 99  Resp: 16  Temp: 98.3 F (36.8 C)  SpO2: 100%     Lab orders placed from triage: UA

## 2022-01-05 NOTE — Progress Notes (Signed)
PRENATAL VISIT NOTE  Subjective:  Kristin Clay is a 36 y.o. [phone] at [redacted]w[redacted]d being seen today for ongoing prenatal care.  She is currently monitored for the following issues for this high-risk pregnancy and has Female circumcision; Language barrier; DM (diabetes mellitus), type 1 ([place]); Poor compliance; Hypertension; Supervision of high risk pregnancy, antepartum; Rh negative, antepartum; 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,; Chronic hypertension affecting pregnancy; Short interval between pregnancies affecting pregnancy, antepartum; History of severe preeclampsia, prior pregnancy, currently pregnant; Previous preterm delivery at 33w6 due to severe preeclampsia, antepartum; History of forceps delivery in prior pregnancy, currently pregnant; Proteinuria affecting pregnancy; HTN in pregnancy, chronic; Atypical chest pain; Blurry vision; DM type 1 causing eye disease ([place]); and Shortness of breath on their problem list.  Patient reports  lethargic .  Contractions: Not present. Vag. Bleeding: None.  Movement: Absent. Denies leaking of fluid.   The following portions of the patient's history were reviewed and updated as appropriate: allergies, current medications, past family history, past medical history, past social history, past surgical history and problem list.   Objective:   Vitals:   01/05/22 1044  BP: (!) 158/99  Pulse: (!) 111  Weight: 112 lb 1.6 oz (50.8 kg)    Fetal Status: Fetal Heart Rate (bpm): 158   Movement: Absent     General:  Alert, oriented and cooperative. Patient is in no acute distress.  Skin: Skin is warm and dry. No rash noted.   Cardiovascular: Normal heart rate noted  Respiratory: Normal respiratory effort, no problems with respiration noted  Abdomen: Soft, gravid, appropriate for gestational age.  Pain/Pressure: Absent     Pelvic: Cervical exam deferred        Extremities: Normal range of motion.  Edema: None  Mental Status:  Normal mood and affect. Normal behavior. Normal judgment and thought content.   Assessment and Plan:  Pregnancy: [name] at [redacted]w[redacted]d 1. [redacted] weeks gestation of pregnancy -offer afp nv -needs fetal echo at 22-26wks pregnancy  2. HTN in pregnancy, chronic Pt on labetalol 100 bid and procardia 90 qday. She states she took her procardia last night and labetalol this morning as usual. She saw Dr. Harriet Masson on Friday and has a 2/24 maternal echo. Her BP was normal on Friday and no change in meds advised.   3. DM type 1 CBG checked and 306 today. A1c 10.4  on 1/23.  Pt states she took lantus 24 units this morning and usually takes 8 with meals but she hasn't been feeling hungry so hasn't been taking it. She hasn't check her sugars in a week b/c she says she's too tired.   I told her she's probably in DKA and needs to go to the hospital now. Dr. Roselie Awkward called and will direct admit to hospital for diabetes control.   CMA today to try and set up [facility] appt  Interpreter used  Preterm labor symptoms and general obstetric precautions including but not limited to vaginal bleeding, contractions, leaking of fluid and fetal movement were reviewed in detail with the patient. Please refer to After Visit Summary for other counseling recommendations.   Return in about 10 days (around 01/15/2022) for in person, high risk ob, md visit.  Future Appointments  Date Time Provider [place]  01/16/2022  1:05 PM [facility] ECHO 5 [id number]  02/02/2022 10:15 AM [facility] NURSE [facility]  02/02/2022 10:30 AM [facility] US3 [facility]  03/27/2022  2:35 PM Tobb, Godfrey Pick, DO  [facility] None    Aletha Halim, MD

## 2022-01-05 NOTE — Care Management (Signed)
CM spoke to RN and Dr. Roselie Awkward. RN informed CM that patient has meter and ran out of strips.  CM verified that patient does have managed medicaid. Verified by the financial counselor.  RN asked patient to have husband bring in home cbg meter tomorrow to hospital for staff to look at.  CM will continue to follow and assess patient's needs.  Rosita Fire RNC-MNN, BSN Transitions of Care Pediatrics/Women's and [place]

## 2022-01-05 NOTE — MAU Note (Addendum)
CRITICAL VALUE STICKER  CRITICAL VALUE: 2.2 Lactic Acid  RECEIVER (on-site recipient of call): Erle Crocker, RN  DATE & TIME NOTIFIED: 01/05/2022 1342  MESSENGER (representative from lab): [place]  MD NOTIFIED: Candelaria Celeste, DO  TIME OF NOTIFICATION: 1345  RESPONSE: Aware. DO to order repeat lab.

## 2022-01-05 NOTE — MAU Provider Note (Addendum)
History     CSN: [id number]  Arrival date and time: 01/05/22 1124   Event Date/Time   First Provider Initiated Contact with Patient 01/05/22 1153      Chief Complaint  Patient presents with   Nausea   HPI This is a 36 yo [name] at 55w2dwith a pregnancy complicated by type 1 diabetes, chronic hypertension.  She has been vomiting for 4 days and has been intolerant of oral foods.  She has been able to keep some liquids down.  She has not been checking her blood sugar for the past week.  She reports taking her long-acting insulin, but has not been taking her short acting insulin.  She reports having epigastric and left upper quadrant abdominal pain that is sharp and nonradiating.  She was seen in the office earlier today and they checked her blood sugar and it was 300.  She was sent over for evaluation and possible admission  OB History     Gravida  3   Para  1   Term  0   Preterm  1   AB  1   Living  1      SAB  1   IAB  0   Ectopic  0   Multiple  0   Live Births  1           Past Medical History:  Diagnosis Date   Chronic hypertension    Chronic hypertension with superimposed severe preeclampsia 056/21/3086  Complication of anesthesia    with hand surgery, local anes did not work, tried twice- had to put her to sleep   Diabetes mellitus type 1 ([place]    DKA (diabetic ketoacidosis) ([place] 10/2020   Epidural hematoma 07/04/2021   GBS bacteriuria 06/09/2021   Obstetric vaginal laceration with type 3c third degree perineal laceration 06/29/2021   Poor fetal growth complicating pregnancy, antepartum, third trimester, other fetus 06/19/2021   Pyelonephritis 10/2020   Rh negative status during pregnancy 05/01/2021   s/p Rhogam 05/13/21   Type 1 diabetes mellitus with ketoacidosis, uncontrolled ([place] 02/22/2020   Type I diabetes mellitus ([place]    dx [place]   UTI (urinary tract infection)     Past Surgical History:  Procedure Laterality Date   female  circumcision Bilateral    clitorectomy as well   FOOT SURGERY  2018   HAND SURGERY  2019    Family History  Problem Relation Age of Onset   Hyperlipidemia Mother    Hypertension Mother    Kidney disease Father    Alzheimer's disease Father     Social History   Tobacco Use   Smoking status: Never   Smokeless tobacco: Never  Vaping Use   Vaping Use: Never used  Substance Use Topics   Alcohol use: Never   Drug use: Never    Allergies:  Allergies  Allergen Reactions   Pork-Derived Products     Pt does not want any pork products    Medications Prior to Admission  Medication Sig Dispense Refill Last Dose   Accu-Chek Softclix Lancets lancets Use as instructed; check blood glucose 4 times daily 100 each 12    acetaminophen (TYLENOL) 500 MG tablet Take 1,000 mg by mouth every 6 (six) hours as needed.      ASPIRIN LOW DOSE 81 MG chewable tablet CHEW 1 TABLET BY MOUTH DAILY. 90 tablet 1    blood glucose meter kit and supplies Dispense based on patient and insurance preference.  Use up to four times daily as directed. (FOR ICD-10 E10.9, E11.9). 1 each 0    glucose blood (ACCU-CHEK GUIDE) test strip Use as instructed; check blood glucose 4 times daily 100 each 12    insulin glargine (LANTUS SOLOSTAR) 100 UNIT/ML Solostar Pen Inject 24 Units into the skin daily. 15 mL 3    insulin lispro (HUMALOG KWIKPEN) 100 UNIT/ML KwikPen Inject 8 Units into the skin 2 (two) times daily. 15 mL 2    Insulin Pen Needle (TECHLITE PEN NEEDLES) 32G X 4 MM MISC Use to inject insulin daily. 100 each 2    labetalol (NORMODYNE) 100 MG tablet Take 100 mg by mouth 2 (two) times daily.      NIFEdipine (ADALAT CC) 90 MG 24 hr tablet Take 90 mg by mouth daily.      Prenatal Vit-Fe Fumarate-FA (PRENATAL VITAMIN) 27-0.8 MG TABS Take 1 tablet by mouth daily. 90 tablet 2    terconazole (TERAZOL 7) 0.4 % vaginal cream Place 1 applicator vaginally at bedtime. 45 g 0     Review of Systems Physical Exam   Blood  pressure 135/89, pulse (!) 114, temperature 98.3 F (36.8 C), temperature source Oral, resp. rate 16, last menstrual period 09/20/2021, SpO2 100 %, unknown if currently breastfeeding.  Physical Exam Vitals reviewed.  Constitutional:      Appearance: Normal appearance.  HENT:     Head: Normocephalic and atraumatic.  Cardiovascular:     Rate and Rhythm: Normal rate and regular rhythm.  Pulmonary:     Effort: Pulmonary effort is normal.     Breath sounds: Normal breath sounds.  Abdominal:     General: Abdomen is flat.     Palpations: Abdomen is soft.  Skin:    General: Skin is warm and dry.     Capillary Refill: Capillary refill takes less than 2 seconds.  Neurological:     General: No focal deficit present.     Mental Status: She is alert.  Psychiatric:        Mood and Affect: Mood normal.        Behavior: Behavior normal.        Thought Content: Thought content normal.        Judgment: Judgment normal.   Results for orders placed or performed during the hospital encounter of 01/05/22 (from the past 24 hour(s))  Urinalysis, Routine w reflex microscopic Urine, Clean Catch     Status: Abnormal   Collection Time: 01/05/22 11:50 AM  Result Value Ref Range   Color, Urine YELLOW YELLOW   APPearance HAZY (A) CLEAR   Specific Gravity, Urine 1.028 1.005 - 1.030   pH 5.0 5.0 - 8.0   Glucose, UA >=500 (A) NEGATIVE mg/dL   Hgb urine dipstick SMALL (A) NEGATIVE   Bilirubin Urine NEGATIVE NEGATIVE   Ketones, ur 80 (A) NEGATIVE mg/dL   Protein, ur >=300 (A) NEGATIVE mg/dL   Nitrite NEGATIVE NEGATIVE   Leukocytes,Ua NEGATIVE NEGATIVE   RBC / HPF 0-5 0 - 5 RBC/hpf   WBC, UA 0-5 0 - 5 WBC/hpf   Bacteria, UA NONE SEEN NONE SEEN   Squamous Epithelial / LPF 0-5 0 - 5   Mucus PRESENT    Hyaline Casts, UA PRESENT    Granular Casts, UA PRESENT   Glucose, capillary     Status: Abnormal   Collection Time: 01/05/22 12:00 PM  Result Value Ref Range   Glucose-Capillary 273 (H) 70 - 99 mg/dL   Comprehensive metabolic panel  Status: Abnormal   Collection Time: 01/05/22 12:10 PM  Result Value Ref Range   Sodium 134 (L) 135 - 145 mmol/L   Potassium 3.7 3.5 - 5.1 mmol/L   Chloride 102 98 - 111 mmol/L   CO2 15 (L) 22 - 32 mmol/L   Glucose, Bld 335 (H) 70 - 99 mg/dL   BUN 16 6 - 20 mg/dL   Creatinine, Ser 0.68 0.44 - 1.00 mg/dL   Calcium 9.0 8.9 - 10.3 mg/dL   Total Protein 8.1 6.5 - 8.1 g/dL   Albumin 3.6 3.5 - 5.0 g/dL   AST 9 (L) 15 - 41 U/L   ALT 11 0 - 44 U/L   Alkaline Phosphatase 76 38 - 126 U/L   Total Bilirubin 1.0 0.3 - 1.2 mg/dL   GFR, Estimated >60 >60 mL/min   Anion gap 17 (H) 5 - 15  CBC     Status: Abnormal   Collection Time: 01/05/22 12:10 PM  Result Value Ref Range   WBC 7.7 4.0 - 10.5 K/uL   RBC 5.62 (H) 3.87 - 5.11 MIL/uL   Hemoglobin 15.6 (H) 12.0 - 15.0 g/dL   HCT 48.0 (H) 36.0 - 46.0 %   MCV 85.4 80.0 - 100.0 fL   MCH 27.8 26.0 - 34.0 pg   MCHC 32.5 30.0 - 36.0 g/dL   RDW 12.9 11.5 - 15.5 %   Platelets 283 150 - 400 K/uL   nRBC 0.0 0.0 - 0.2 %  Type and screen [place]     Status: None (Preliminary result)   Collection Time: 01/05/22 12:10 PM  Result Value Ref Range   ABO/RH(D) A NEG    Antibody Screen PENDING    Sample Expiration      01/08/2022,2359 Performed at [facility], [address] [place], [place] [address]      MAU Course  Procedures  MDM Anion gap of 17. Insulin drip initiated. Will admit.   Assessment and Plan   1. [redacted] weeks gestation of pregnancy   2. Type 1 diabetes mellitus with ketoacidosis without coma ([place])   3. HTN in pregnancy, chronic    Admit Insulin drip BMP q4hr BHA q8hr KCl x2 runs Initiate D5LR when CBG hits 250.  Antiemetics: Zofran, reglan.    Truett Mainland 01/05/2022, 1:29 PM

## 2022-01-06 DIAGNOSIS — O10919 Unspecified pre-existing hypertension complicating pregnancy, unspecified trimester: Secondary | ICD-10-CM

## 2022-01-06 DIAGNOSIS — E876 Hypokalemia: Secondary | ICD-10-CM | POA: Diagnosis present

## 2022-01-06 DIAGNOSIS — E1010 Type 1 diabetes mellitus with ketoacidosis without coma: Secondary | ICD-10-CM | POA: Diagnosis not present

## 2022-01-06 LAB — BASIC METABOLIC PANEL
Anion gap: 7 (ref 5–15)
Anion gap: 11 (ref 5–15)
Anion gap: 10 (ref 5–15)
BUN: 6 mg/dL (ref 6–20)
BUN: 6 mg/dL (ref 6–20)
BUN: 5 mg/dL — ABNORMAL LOW (ref 6–20)
CO2: 20 mmol/L — ABNORMAL LOW (ref 22–32)
CO2: 20 mmol/L — ABNORMAL LOW (ref 22–32)
CO2: 19 mmol/L — ABNORMAL LOW (ref 22–32)
Calcium: 7.9 mg/dL — ABNORMAL LOW (ref 8.9–10.3)
Calcium: 8.1 mg/dL — ABNORMAL LOW (ref 8.9–10.3)
Calcium: 8 mg/dL — ABNORMAL LOW (ref 8.9–10.3)
Chloride: 108 mmol/L (ref 98–111)
Chloride: 106 mmol/L (ref 98–111)
Chloride: 106 mmol/L (ref 98–111)
Creatinine, Ser: 0.39 mg/dL — ABNORMAL LOW (ref 0.44–1.00)
Creatinine, Ser: 0.41 mg/dL — ABNORMAL LOW (ref 0.44–1.00)
Creatinine, Ser: 0.34 mg/dL — ABNORMAL LOW (ref 0.44–1.00)
GFR, Estimated: >60 mL/min (ref >60)
GFR, Estimated: >60 mL/min (ref >60)
GFR, Estimated: >60 mL/min (ref >60)
Glucose, Bld: 97 mg/dL (ref 70–99)
Glucose, Bld: 106 mg/dL — ABNORMAL HIGH (ref 70–99)
Glucose, Bld: 115 mg/dL — ABNORMAL HIGH (ref 70–99)
Potassium: 2.8 mmol/L — ABNORMAL LOW (ref 3.5–5.1)
Potassium: 2.8 mmol/L — ABNORMAL LOW (ref 3.5–5.1)
Potassium: 2.6 mmol/L — CL (ref 3.5–5.1)
Sodium: 135 mmol/L (ref 135–145)
Sodium: 137 mmol/L (ref 135–145)
Sodium: 135 mmol/L (ref 135–145)

## 2022-01-06 LAB — GLUCOSE, CAPILLARY
Glucose-Capillary: 101 mg/dL — ABNORMAL HIGH (ref 70–99)
Glucose-Capillary: 103 mg/dL — ABNORMAL HIGH (ref 70–99)
Glucose-Capillary: 108 mg/dL — ABNORMAL HIGH (ref 70–99)
Glucose-Capillary: 114 mg/dL — ABNORMAL HIGH (ref 70–99)
Glucose-Capillary: 121 mg/dL — ABNORMAL HIGH (ref 70–99)
Glucose-Capillary: 121 mg/dL — ABNORMAL HIGH (ref 70–99)
Glucose-Capillary: 127 mg/dL — ABNORMAL HIGH (ref 70–99)
Glucose-Capillary: 129 mg/dL — ABNORMAL HIGH (ref 70–99)
Glucose-Capillary: 130 mg/dL — ABNORMAL HIGH (ref 70–99)
Glucose-Capillary: 166 mg/dL — ABNORMAL HIGH (ref 70–99)
Glucose-Capillary: 79 mg/dL (ref 70–99)
Glucose-Capillary: 82 mg/dL (ref 70–99)
Glucose-Capillary: 88 mg/dL (ref 70–99)
Glucose-Capillary: 97 mg/dL (ref 70–99)
Glucose-Capillary: 98 mg/dL (ref 70–99)

## 2022-01-06 LAB — MAGNESIUM: Magnesium: 1.8 mg/dL (ref 1.7–2.4)

## 2022-01-06 LAB — BETA-HYDROXYBUTYRIC ACID
Beta-Hydroxybutyric Acid: 1.63 mmol/L — ABNORMAL HIGH (ref 0.05–0.27)
Beta-Hydroxybutyric Acid: 0.8 mmol/L — ABNORMAL HIGH (ref 0.05–0.27)

## 2022-01-06 MED ORDER — INSULIN ASPART 100 UNIT/ML IJ SOLN: 4.0000 [IU] | Freq: Three times a day (TID) | INTRAMUSCULAR | Status: DC

## 2022-01-06 MED ORDER — LACTATED RINGERS IV SOLN: INTRAVENOUS | Status: DC

## 2022-01-06 MED ORDER — INSULIN ASPART 100 UNIT/ML IJ SOLN
0.0000 [IU] | Freq: Three times a day (TID) | INTRAMUSCULAR | Status: DC
  Administered 2022-01-06: 4 [IU] via SUBCUTANEOUS
  Administered 2022-01-07: 3 [IU] via SUBCUTANEOUS

## 2022-01-06 MED ORDER — SCOPOLAMINE 1 MG/3DAYS TD PT72
1.0000 | MEDICATED_PATCH | TRANSDERMAL | Status: DC
  Administered 2022-01-06: 1.5 mg via TRANSDERMAL
  Filled 2022-01-06: qty 1

## 2022-01-06 MED ORDER — INSULIN ASPART 100 UNIT/ML IJ SOLN: 8.0000 [IU] | Freq: Three times a day (TID) | INTRAMUSCULAR | Status: DC

## 2022-01-06 MED ORDER — INSULIN DETEMIR 100 UNIT/ML ~~LOC~~ SOLN
12.0000 [IU] | Freq: Two times a day (BID) | SUBCUTANEOUS | Status: DC
  Administered 2022-01-06 – 2022-01-07 (×3): 12 [IU] via SUBCUTANEOUS
  Filled 2022-01-06 (×4): qty 0.12

## 2022-01-06 MED ORDER — POTASSIUM CHLORIDE 10 MEQ/100ML IV SOLN
10.0000 meq | INTRAVENOUS | Status: AC
  Administered 2022-01-06 (×3): 10 meq via INTRAVENOUS
  Filled 2022-01-06 (×4): qty 100

## 2022-01-06 MED ORDER — POTASSIUM CHLORIDE CRYS ER 20 MEQ PO TBCR
40.0000 meq | EXTENDED_RELEASE_TABLET | Freq: Two times a day (BID) | ORAL | Status: DC
  Administered 2022-01-06 – 2022-01-07 (×3): 40 meq via ORAL
  Filled 2022-01-06 (×3): qty 2

## 2022-01-06 MED ORDER — INSULIN DETEMIR 100 UNIT/ML ~~LOC~~ SOLN
24.0000 [IU] | Freq: Every day | SUBCUTANEOUS | Status: DC
  Filled 2022-01-06: qty 0.24

## 2022-01-06 NOTE — Progress Notes (Signed)
Patient ID: danyetta gillham, female   DOB: Oct 22, 1986, 36 y.o.   MRN: [id number]  FACULTY PRACTICE ANTEPARTUM NOTE  talina pleitez is a 36 y.o. [id number] at [redacted]w[redacted]d  who is admitted for DKA and hyperemesis.   Fetal presentation is unsure. Length of Stay:  1  Days  Subjective: Patient feeling much better. Some nausea, but tolerating sips with medications. No complications over night. Does have critical hypokalemia. Patient reports good fetal movement.   She reports no uterine contractions She reports no bleeding  She reports no loss of fluid per vagina.  Vitals:  Blood pressure (!) 149/79, pulse 96, temperature 98.1 F (36.7 C), temperature source Oral, resp. rate 16, height 5\' 2"  (1.575 m), weight 50.8 kg, last menstrual period 09/20/2021, SpO2 100 %, unknown if currently breastfeeding. Physical Examination:  General appearance - alert, well appearing, and in no distress and oriented to person, place, and time Abdomen - soft, nontender, nondistended, no masses or organomegaly Extremities - peripheral pulses normal, no pedal edema, no clubbing or cyanosis Fundal Height:  size equals dates   Fetal Monitoring:   none  Labs:  Results for orders placed or performed during the hospital encounter of 01/05/22 (from the past 24 hour(s))  Urinalysis, Routine w reflex microscopic Urine, Clean Catch   Collection Time: 01/05/22 11:50 AM  Result Value Ref Range   Color, Urine YELLOW YELLOW   APPearance HAZY (A) CLEAR   Specific Gravity, Urine 1.028 1.005 - 1.030   pH 5.0 5.0 - 8.0   Glucose, UA >=500 (A) NEGATIVE mg/dL   Hgb urine dipstick SMALL (A) NEGATIVE   Bilirubin Urine NEGATIVE NEGATIVE   Ketones, ur 80 (A) NEGATIVE mg/dL   Protein, ur 01/07/22 (A) NEGATIVE mg/dL   Nitrite NEGATIVE NEGATIVE   Leukocytes,Ua NEGATIVE NEGATIVE   RBC / HPF 0-5 0 - 5 RBC/hpf   WBC, UA 0-5 0 - 5 WBC/hpf   Bacteria, UA NONE SEEN NONE SEEN   Squamous Epithelial / LPF 0-5 0 - 5   Mucus  PRESENT    Hyaline Casts, UA PRESENT    Granular Casts, UA PRESENT   Glucose, capillary   Collection Time: 01/05/22 12:00 PM  Result Value Ref Range   Glucose-Capillary 273 (H) 70 - 99 mg/dL  Comprehensive metabolic panel   Collection Time: 01/05/22 12:10 PM  Result Value Ref Range   Sodium 134 (L) 135 - 145 mmol/L   Potassium 3.7 3.5 - 5.1 mmol/L   Chloride 102 98 - 111 mmol/L   CO2 15 (L) 22 - 32 mmol/L   Glucose, Bld 335 (H) 70 - 99 mg/dL   BUN 16 6 - 20 mg/dL   Creatinine, Ser 01/07/22 0.44 - 1.00 mg/dL   Calcium 9.0 8.9 - 5.80 mg/dL   Total Protein 8.1 6.5 - 8.1 g/dL   Albumin 3.6 3.5 - 5.0 g/dL   AST 9 (L) 15 - 41 U/L   ALT 11 0 - 44 U/L   Alkaline Phosphatase 76 38 - 126 U/L   Total Bilirubin 1.0 0.3 - 1.2 mg/dL   GFR, Estimated 99.8 >33 mL/min   Anion gap 17 (H) 5 - 15  CBC   Collection Time: 01/05/22 12:10 PM  Result Value Ref Range   WBC 7.7 4.0 - 10.5 K/uL   RBC 5.62 (H) 3.87 - 5.11 MIL/uL   Hemoglobin 15.6 (H) 12.0 - 15.0 g/dL   HCT 01/07/22 (H) 50.5 - 39.7 %   MCV 85.4 80.0 -  100.0 fL   MCH 27.8 26.0 - 34.0 pg   MCHC 32.5 30.0 - 36.0 g/dL   RDW 37.9 02.4 - 09.7 %   Platelets 283 150 - 400 K/uL   nRBC 0.0 0.0 - 0.2 %  Beta-hydroxybutyric acid   Collection Time: 01/05/22 12:10 PM  Result Value Ref Range   Beta-Hydroxybutyric Acid 5.05 (H) 0.05 - 0.27 mmol/L  Lactic acid, plasma   Collection Time: 01/05/22 12:10 PM  Result Value Ref Range   Lactic Acid, Venous 2.2 (HH) 0.5 - 1.9 mmol/L  Lipase, blood   Collection Time: 01/05/22 12:10 PM  Result Value Ref Range   Lipase 27 11 - 51 U/L  Type and screen [facility]   Collection Time: 01/05/22 12:10 PM  Result Value Ref Range   ABO/RH(D) A NEG    Antibody Screen POS    Sample Expiration 01/08/2022,2359    Antibody Identification      PASSIVELY ACQUIRED ANTI-D Performed at [facility], [address] [place], [place] [address]   Resp Panel by RT-PCR (Flu A&B, Covid) Nasopharyngeal Swab    Collection Time: 01/05/22 12:21 PM   Specimen: Nasopharyngeal Swab; Nasopharyngeal(NP) swabs in vial transport medium  Result Value Ref Range   SARS Coronavirus 2 by RT PCR NEGATIVE NEGATIVE   Influenza A by PCR NEGATIVE NEGATIVE   Influenza B by PCR NEGATIVE NEGATIVE  Glucose, capillary   Collection Time: 01/05/22  2:11 PM  Result Value Ref Range   Glucose-Capillary 229 (H) 70 - 99 mg/dL  Glucose, capillary   Collection Time: 01/05/22  2:41 PM  Result Value Ref Range   Glucose-Capillary 240 (H) 70 - 99 mg/dL  Glucose, capillary   Collection Time: 01/05/22  3:25 PM  Result Value Ref Range   Glucose-Capillary 234 (H) 70 - 99 mg/dL  Glucose, capillary   Collection Time: 01/05/22  4:01 PM  Result Value Ref Range   Glucose-Capillary 164 (H) 70 - 99 mg/dL  Basic metabolic panel   Collection Time: 01/05/22  4:02 PM  Result Value Ref Range   Sodium 137 135 - 145 mmol/L   Potassium 3.3 (L) 3.5 - 5.1 mmol/L   Chloride 109 98 - 111 mmol/L   CO2 17 (L) 22 - 32 mmol/L   Glucose, Bld 183 (H) 70 - 99 mg/dL   BUN 11 6 - 20 mg/dL   Creatinine, Ser 9.24 0.44 - 1.00 mg/dL   Calcium 8.4 (L) 8.9 - 10.3 mg/dL   GFR, Estimated >26 >83 mL/min   Anion gap 11 5 - 15  Lactic acid, plasma   Collection Time: 01/05/22  4:02 PM  Result Value Ref Range   Lactic Acid, Venous 2.2 (HH) 0.5 - 1.9 mmol/L  Glucose, capillary   Collection Time: 01/05/22  5:08 PM  Result Value Ref Range   Glucose-Capillary 185 (H) 70 - 99 mg/dL  Glucose, capillary   Collection Time: 01/05/22  6:12 PM  Result Value Ref Range   Glucose-Capillary 172 (H) 70 - 99 mg/dL  Glucose, capillary   Collection Time: 01/05/22  6:57 PM  Result Value Ref Range   Glucose-Capillary 179 (H) 70 - 99 mg/dL  Glucose, capillary   Collection Time: 01/05/22  8:10 PM  Result Value Ref Range   Glucose-Capillary 138 (H) 70 - 99 mg/dL  Basic metabolic panel   Collection Time: 01/05/22  8:51 PM  Result Value Ref Range   Sodium 136 135 - 145  mmol/L   Potassium  3.6 3.5 - 5.1 mmol/L   Chloride 109 98 - 111 mmol/L   CO2 19 (L) 22 - 32 mmol/L   Glucose, Bld 132 (H) 70 - 99 mg/dL   BUN 9 6 - 20 mg/dL   Creatinine, Ser 4.090.48 0.44 - 1.00 mg/dL   Calcium 8.2 (L) 8.9 - 10.3 mg/dL   GFR, Estimated >81>60 >19>60 mL/min   Anion gap 8 5 - 15  Beta-hydroxybutyric acid   Collection Time: 01/05/22  8:51 PM  Result Value Ref Range   Beta-Hydroxybutyric Acid 0.10 0.05 - 0.27 mmol/L  Glucose, capillary   Collection Time: 01/05/22  9:32 PM  Result Value Ref Range   Glucose-Capillary 141 (H) 70 - 99 mg/dL  Glucose, capillary   Collection Time: 01/05/22 10:36 PM  Result Value Ref Range   Glucose-Capillary 96 70 - 99 mg/dL  Glucose, capillary   Collection Time: 01/05/22 11:44 PM  Result Value Ref Range   Glucose-Capillary 106 (H) 70 - 99 mg/dL  Basic metabolic panel   Collection Time: 01/06/22 12:42 AM  Result Value Ref Range   Sodium 135 135 - 145 mmol/L   Potassium 2.8 (L) 3.5 - 5.1 mmol/L   Chloride 108 98 - 111 mmol/L   CO2 20 (L) 22 - 32 mmol/L   Glucose, Bld 97 70 - 99 mg/dL   BUN 6 6 - 20 mg/dL   Creatinine, Ser 1.470.39 (L) 0.44 - 1.00 mg/dL   Calcium 7.9 (L) 8.9 - 10.3 mg/dL   GFR, Estimated >82>60 >95>60 mL/min   Anion gap 7 5 - 15  Glucose, capillary   Collection Time: 01/06/22 12:54 AM  Result Value Ref Range   Glucose-Capillary 88 70 - 99 mg/dL  Glucose, capillary   Collection Time: 01/06/22  2:00 AM  Result Value Ref Range   Glucose-Capillary 101 (H) 70 - 99 mg/dL  Glucose, capillary   Collection Time: 01/06/22  3:02 AM  Result Value Ref Range   Glucose-Capillary 98 70 - 99 mg/dL  Glucose, capillary   Collection Time: 01/06/22  4:01 AM  Result Value Ref Range   Glucose-Capillary 79 70 - 99 mg/dL  Basic metabolic panel   Collection Time: 01/06/22  5:06 AM  Result Value Ref Range   Sodium 137 135 - 145 mmol/L   Potassium 2.8 (L) 3.5 - 5.1 mmol/L   Chloride 106 98 - 111 mmol/L   CO2 20 (L) 22 - 32 mmol/L   Glucose, Bld  106 (H) 70 - 99 mg/dL   BUN 6 6 - 20 mg/dL   Creatinine, Ser 6.210.41 (L) 0.44 - 1.00 mg/dL   Calcium 8.1 (L) 8.9 - 10.3 mg/dL   GFR, Estimated >30>60 >86>60 mL/min   Anion gap 11 5 - 15  Beta-hydroxybutyric acid   Collection Time: 01/06/22  5:06 AM  Result Value Ref Range   Beta-Hydroxybutyric Acid 1.63 (H) 0.05 - 0.27 mmol/L  Glucose, capillary   Collection Time: 01/06/22  5:07 AM  Result Value Ref Range   Glucose-Capillary 103 (H) 70 - 99 mg/dL  Glucose, capillary   Collection Time: 01/06/22  6:05 AM  Result Value Ref Range   Glucose-Capillary 130 (H) 70 - 99 mg/dL  Glucose, capillary   Collection Time: 01/06/22  7:07 AM  Result Value Ref Range   Glucose-Capillary 127 (H) 70 - 99 mg/dL   Comment 1 Document in Chart   Glucose, capillary   Collection Time: 01/06/22  8:04 AM  Result Value Ref Range   Glucose-Capillary 121 (  H) 70 - 99 mg/dL  Glucose, capillary   Collection Time: 01/06/22  9:06 AM  Result Value Ref Range   Glucose-Capillary 108 (H) 70 - 99 mg/dL  Basic metabolic panel   Collection Time: 01/06/22  9:12 AM  Result Value Ref Range   Sodium 135 135 - 145 mmol/L   Potassium 2.6 (LL) 3.5 - 5.1 mmol/L   Chloride 106 98 - 111 mmol/L   CO2 19 (L) 22 - 32 mmol/L   Glucose, Bld 115 (H) 70 - 99 mg/dL   BUN 5 (L) 6 - 20 mg/dL   Creatinine, Ser 4.50 (L) 0.44 - 1.00 mg/dL   Calcium 8.0 (L) 8.9 - 10.3 mg/dL   GFR, Estimated >38 >88 mL/min   Anion gap 10 5 - 15  Magnesium   Collection Time: 01/06/22  9:12 AM  Result Value Ref Range   Magnesium 1.8 1.7 - 2.4 mg/dL  Results for orders placed or performed in visit on 01/05/22 (from the past 24 hour(s))  Glucose, capillary   Collection Time: 01/05/22 10:57 AM  Result Value Ref Range   Glucose-Capillary 306 (H) 70 - 99 mg/dL    Imaging Studies:    none   Medications:  Scheduled  aspirin  81 mg Oral Daily   insulin aspart  0-16 Units Subcutaneous TID PC   insulin aspart  8 Units Subcutaneous TID WC   insulin detemir  24  Units Subcutaneous Daily   labetalol  100 mg Oral BID   metoCLOPramide (REGLAN) injection  10 mg Intravenous Q6H   NIFEdipine  90 mg Oral Daily   potassium chloride  40 mEq Oral BID   I have reviewed the patient's current medications.  ASSESSMENT: Principal Problem:   DKA, type 1 (HCC) Active Problems:   Hypokalemia   PLAN: 15 weeks gesation DKA Gap closed and bicarb normal Start levemir 24 units - will do 12 units BID Carb modified diet Premeal insulin 4 units premeal. D/C dextrose and insulin drip. CHTN Continue nifedipine and labetalol Hyperemesis Improved with reglan (scheduled) and zofran (prn) No further doses of zofran. 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   Levie Heritage, DO 01/06/2022,10:04 AM

## 2022-01-06 NOTE — Progress Notes (Signed)
Date and time results received: 01/06/22 0955 (use smartphrase ".now" to insert current time)  Test: CMP Critical Value: Potassium 2.6   Name of Provider Notified: Dr. Adrian Blackwater   Orders Received? Or Actions Taken?:  Give ordered potassium PO and IV

## 2022-01-06 NOTE — Progress Notes (Signed)
ADA Standards of Care 2023 Diabetes in Pregnancy Target Glucose Ranges:  Fasting: 70 - 95 mg/dL 1 hr postprandial:  528 - 140mg /dL (from first bite of meal) 2 hr postprandial:  100 - 120 mg/dL (from first bit of meal)   Home meds:   Lantus 24 units daily Humalog 8 units bid  Hospital meds:   Transitioned from IV insulin to Levemir 12 units bid Novolog 4 units tid with meals Novolog 0-16 units tid 2 hours PP  Spoke with patient using [employer] translator: Arabic. Discussed goal blood sugars for pregnancy.  Patient was able to correctly state goals.  She states that she has a 15 month old at home and has been taking insulin for over 13 years.   She states she often gets busy and forgets to take insulin. Encouraged her to set alarms on her phone to remind her to monitor fasting and 2 hour post-prandial.  She states "that's a good idea'.   We also discussed insulin injections.  She states that she has been injecting in her arms and around her abdomen.  I encouraged her to, inject more in her sides, arms and legs and to stay away from the umbilicus area. We discussed why blood sugar control is important in pregnancy to prevent complications.  I gave her an extra meter ([employer]) for home use due to complaints of running out of strips. Patient appreciative of visit.    Please order insulin pen needles for home use [phone]).   Thanks,  [phone], RN, BC-ADM Inpatient Diabetes Coordinator Pager [phone]  (8a-5p)

## 2022-01-07 ENCOUNTER — Other Ambulatory Visit (HOSPITAL_COMMUNITY): Payer: Self-pay

## 2022-01-07 DIAGNOSIS — O10919 Unspecified pre-existing hypertension complicating pregnancy, unspecified trimester: Secondary | ICD-10-CM | POA: Diagnosis not present

## 2022-01-07 DIAGNOSIS — Z3A15 15 weeks gestation of pregnancy: Secondary | ICD-10-CM | POA: Diagnosis not present

## 2022-01-07 DIAGNOSIS — O24012 Pre-existing diabetes mellitus, type 1, in pregnancy, second trimester: Principal | ICD-10-CM

## 2022-01-07 DIAGNOSIS — E1010 Type 1 diabetes mellitus with ketoacidosis without coma: Secondary | ICD-10-CM | POA: Diagnosis not present

## 2022-01-07 DIAGNOSIS — E876 Hypokalemia: Secondary | ICD-10-CM | POA: Diagnosis not present

## 2022-01-07 LAB — BASIC METABOLIC PANEL
Anion gap: 8 (ref 5–15)
BUN: <5 mg/dL — ABNORMAL LOW (ref 6–20)
CO2: 20 mmol/L — ABNORMAL LOW (ref 22–32)
Calcium: 8.1 mg/dL — ABNORMAL LOW (ref 8.9–10.3)
Chloride: 108 mmol/L (ref 98–111)
Creatinine, Ser: <0.3 mg/dL — ABNORMAL LOW (ref 0.44–1.00)
Glucose, Bld: 84 mg/dL (ref 70–99)
Potassium: 3.4 mmol/L — ABNORMAL LOW (ref 3.5–5.1)
Sodium: 136 mmol/L (ref 135–145)

## 2022-01-07 LAB — GLUCOSE, CAPILLARY
Glucose-Capillary: 92 mg/dL (ref 70–99)
Glucose-Capillary: 129 mg/dL — ABNORMAL HIGH (ref 70–99)

## 2022-01-07 MED ORDER — INSULIN ASPART 100 UNIT/ML FLEXPEN
4.0000 [IU] | PEN_INJECTOR | Freq: Three times a day (TID) | SUBCUTANEOUS | 11 refills | Status: DC
  Filled 2022-01-07: qty 6, 30d supply, fill #0

## 2022-01-07 MED ORDER — ONDANSETRON 4 MG PO TBDP
4.0000 mg | ORAL_TABLET | Freq: Four times a day (QID) | ORAL | 2 refills | Status: DC | PRN
  Filled 2022-01-07: qty 30, 8d supply, fill #0

## 2022-01-07 MED ORDER — POTASSIUM CHLORIDE CRYS ER 20 MEQ PO TBCR
40.0000 meq | EXTENDED_RELEASE_TABLET | Freq: Every day | ORAL | 0 refills | Status: DC
  Filled 2022-01-07: qty 10, 5d supply, fill #0

## 2022-01-07 MED ORDER — INSULIN GLARGINE 100 UNIT/ML SOLOSTAR PEN
12.0000 [IU] | PEN_INJECTOR | Freq: Two times a day (BID) | SUBCUTANEOUS | 11 refills | Status: DC
  Filled 2022-01-07: qty 9, 38d supply, fill #0

## 2022-01-07 MED ORDER — ACCU-CHEK SOFTCLIX LANCETS MISC
10 refills | Status: DC
  Filled 2022-01-07: qty 100, 25d supply, fill #0

## 2022-01-07 MED ORDER — ACCU-CHEK GUIDE VI STRP
ORAL_STRIP | 10 refills | Status: DC
  Filled 2022-01-07: qty 100, 25d supply, fill #0

## 2022-01-07 MED ORDER — ACCU-CHEK GUIDE W/DEVICE KIT
1.0000 | PACK | Freq: Four times a day (QID) | 0 refills | Status: DC
  Filled 2022-01-07: qty 1, 30d supply, fill #0

## 2022-01-07 MED ORDER — SCOPOLAMINE 1 MG/3DAYS TD PT72
1.0000 | MEDICATED_PATCH | TRANSDERMAL | 12 refills | Status: DC
  Filled 2022-01-07: qty 10, 30d supply, fill #0

## 2022-01-07 MED ORDER — METOCLOPRAMIDE HCL 10 MG PO TABS
10.0000 mg | ORAL_TABLET | Freq: Four times a day (QID) | ORAL | 2 refills | Status: DC | PRN
  Filled 2022-01-07: qty 60, 15d supply, fill #0

## 2022-01-07 NOTE — Progress Notes (Signed)
Discharge instructions and prescriptions given to pt. Discussed signs and symptoms to report to the MD, upcoming appointments, and meds. Pt verbalizes understanding and has no questions or concerns at this time. Pt discharged home from hospital in stable condition. 

## 2022-01-07 NOTE — Discharge Summary (Signed)
Antenatal Physician Discharge Summary  Patient ID: Kristin Clay MRN: [id number] DOB/AGE: [birth year] 36 y.o.  Admit date: 01/05/2022 Discharge date: 01/07/2022  Admission and Discharge Diagnoses:  Principal Problem:   DKA, type 1 ([place]) Active Problems:   Supervision of high risk pregnancy, antepartum   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,   Chronic hypertension affecting pregnancy   Hypokalemia  [redacted] weeks gestation  Prenatal Procedures: none  Consults: Diabetes Coordinator  Hospital Course:  Kristin Clay is a 36 y.o. [phone] with IUP at 69w4dadmitted for management of DKA, nausea and vomiting.  She was treated per protocol, and insulin therapy was adjusted by Dr. SNehemiah Settle(Family Medicine - OB specialist). Her hypokalemia was also repleted.  Her anion gap closed and her symptoms improved, with the help of antiemetics. She was tolerating diabetic diet and had no further concerns. No obstetric issues.  She was deemed stable for discharge to home with outpatient follow up.  Discharge Exam: Temp:  [98.2 F (36.8 C)-98.5 F (36.9 C)] 98.3 F (36.8 C) (02/15 0749) Pulse Rate:  [94-106] 97 (02/15 0749) Resp:  [16] 16 (02/15 0749) BP: (130-141)/(69-82) 137/79 (02/15 0749) SpO2:  [100 %] 100 % (02/15 0749) FHR: 153 bpm  Physical Examination: CONSTITUTIONAL: Well-developed, well-nourished female in no acute distress.  SKIN: Skin is warm and dry. No rash noted. Not diaphoretic. No erythema. No pallor. NEUROLOGIC: Alert and oriented to person, place, and time. Normal reflexes, muscle tone coordination. No cranial nerve deficit noted. PSYCHIATRIC: Normal mood and affect. Normal behavior. Normal judgment and thought content. CARDIOVASCULAR: Normal heart rate noted, regular rhythm RESPIRATORY: Effort and breath sounds normal, no problems with respiration noted MUSCULOSKELETAL: Normal range of motion. No edema and no tenderness. 2+ distal  pulses. ABDOMEN: Soft, nontender, nondistended, gravid. CERVIX:  Deferred  Significant Diagnostic Studies:  Results for orders placed or performed during the hospital encounter of 01/05/22 (from the past 168 hour(s))  Urinalysis, Routine w reflex microscopic Urine, Clean Catch   Collection Time: 01/05/22 11:50 AM  Result Value Ref Range   Color, Urine YELLOW YELLOW   APPearance HAZY (A) CLEAR   Specific Gravity, Urine 1.028 1.005 - 1.030   pH 5.0 5.0 - 8.0   Glucose, UA >=500 (A) NEGATIVE mg/dL   Hgb urine dipstick SMALL (A) NEGATIVE   Bilirubin Urine NEGATIVE NEGATIVE   Ketones, ur 80 (A) NEGATIVE mg/dL   Protein, ur >=300 (A) NEGATIVE mg/dL   Nitrite NEGATIVE NEGATIVE   Leukocytes,Ua NEGATIVE NEGATIVE   RBC / HPF 0-5 0 - 5 RBC/hpf   WBC, UA 0-5 0 - 5 WBC/hpf   Bacteria, UA NONE SEEN NONE SEEN   Squamous Epithelial / LPF 0-5 0 - 5   Mucus PRESENT    Hyaline Casts, UA PRESENT    Granular Casts, UA PRESENT   Glucose, capillary   Collection Time: 01/05/22 12:00 PM  Result Value Ref Range   Glucose-Capillary 273 (H) 70 - 99 mg/dL  Comprehensive metabolic panel   Collection Time: 01/05/22 12:10 PM  Result Value Ref Range   Sodium 134 (L) 135 - 145 mmol/L   Potassium 3.7 3.5 - 5.1 mmol/L   Chloride 102 98 - 111 mmol/L   CO2 15 (L) 22 - 32 mmol/L   Glucose, Bld 335 (H) 70 - 99 mg/dL   BUN 16 6 - 20 mg/dL   Creatinine, Ser 0.68 0.44 - 1.00 mg/dL   Calcium 9.0 8.9 - 10.3 mg/dL   Total Protein  8.1 6.5 - 8.1 g/dL   Albumin 3.6 3.5 - 5.0 g/dL   AST 9 (L) 15 - 41 U/L   ALT 11 0 - 44 U/L   Alkaline Phosphatase 76 38 - 126 U/L   Total Bilirubin 1.0 0.3 - 1.2 mg/dL   GFR, Estimated >60 >60 mL/min   Anion gap 17 (H) 5 - 15  CBC   Collection Time: 01/05/22 12:10 PM  Result Value Ref Range   WBC 7.7 4.0 - 10.5 K/uL   RBC 5.62 (H) 3.87 - 5.11 MIL/uL   Hemoglobin 15.6 (H) 12.0 - 15.0 g/dL   HCT 48.0 (H) 36.0 - 46.0 %   MCV 85.4 80.0 - 100.0 fL   MCH 27.8 26.0 - 34.0 pg   MCHC  32.5 30.0 - 36.0 g/dL   RDW 12.9 11.5 - 15.5 %   Platelets 283 150 - 400 K/uL   nRBC 0.0 0.0 - 0.2 %  Beta-hydroxybutyric acid   Collection Time: 01/05/22 12:10 PM  Result Value Ref Range   Beta-Hydroxybutyric Acid 5.05 (H) 0.05 - 0.27 mmol/L  Lactic acid, plasma   Collection Time: 01/05/22 12:10 PM  Result Value Ref Range   Lactic Acid, Venous 2.2 (HH) 0.5 - 1.9 mmol/L  Lipase, blood   Collection Time: 01/05/22 12:10 PM  Result Value Ref Range   Lipase 27 11 - 51 U/L  Type and screen [place]   Collection Time: 01/05/22 12:10 PM  Result Value Ref Range   ABO/RH(D) A NEG    Antibody Screen POS    Sample Expiration 01/08/2022,2359    Antibody Identification      PASSIVELY ACQUIRED ANTI-D Performed at [facility], [address] [place], [place] [address]   Resp Panel by RT-PCR (Flu A&B, Covid) Nasopharyngeal Swab   Collection Time: 01/05/22 12:21 PM   Specimen: Nasopharyngeal Swab; Nasopharyngeal(NP) swabs in vial transport medium  Result Value Ref Range   SARS Coronavirus 2 by RT PCR NEGATIVE NEGATIVE   Influenza A by PCR NEGATIVE NEGATIVE   Influenza B by PCR NEGATIVE NEGATIVE  Glucose, capillary   Collection Time: 01/05/22  2:11 PM  Result Value Ref Range   Glucose-Capillary 229 (H) 70 - 99 mg/dL  Glucose, capillary   Collection Time: 01/05/22  2:41 PM  Result Value Ref Range   Glucose-Capillary 240 (H) 70 - 99 mg/dL  Glucose, capillary   Collection Time: 01/05/22  3:25 PM  Result Value Ref Range   Glucose-Capillary 234 (H) 70 - 99 mg/dL  Glucose, capillary   Collection Time: 01/05/22  4:01 PM  Result Value Ref Range   Glucose-Capillary 164 (H) 70 - 99 mg/dL  Basic metabolic panel   Collection Time: 01/05/22  4:02 PM  Result Value Ref Range   Sodium 137 135 - 145 mmol/L   Potassium 3.3 (L) 3.5 - 5.1 mmol/L   Chloride 109 98 - 111 mmol/L   CO2 17 (L) 22 - 32 mmol/L   Glucose, Bld 183 (H) 70 - 99 mg/dL   BUN 11 6 - 20 mg/dL    Creatinine, Ser 0.50 0.44 - 1.00 mg/dL   Calcium 8.4 (L) 8.9 - 10.3 mg/dL   GFR, Estimated >60 >60 mL/min   Anion gap 11 5 - 15  Lactic acid, plasma   Collection Time: 01/05/22  4:02 PM  Result Value Ref Range   Lactic Acid, Venous 2.2 (HH) 0.5 - 1.9 mmol/L  Glucose, capillary   Collection Time: 01/05/22  5:08 PM  Result Value Ref Range   Glucose-Capillary 185 (H) 70 - 99 mg/dL  Glucose, capillary   Collection Time: 01/05/22  6:12 PM  Result Value Ref Range   Glucose-Capillary 172 (H) 70 - 99 mg/dL  Glucose, capillary   Collection Time: 01/05/22  6:57 PM  Result Value Ref Range   Glucose-Capillary 179 (H) 70 - 99 mg/dL  Glucose, capillary   Collection Time: 01/05/22  8:10 PM  Result Value Ref Range   Glucose-Capillary 138 (H) 70 - 99 mg/dL  Basic metabolic panel   Collection Time: 01/05/22  8:51 PM  Result Value Ref Range   Sodium 136 135 - 145 mmol/L   Potassium 3.6 3.5 - 5.1 mmol/L   Chloride 109 98 - 111 mmol/L   CO2 19 (L) 22 - 32 mmol/L   Glucose, Bld 132 (H) 70 - 99 mg/dL   BUN 9 6 - 20 mg/dL   Creatinine, Ser 0.48 0.44 - 1.00 mg/dL   Calcium 8.2 (L) 8.9 - 10.3 mg/dL   GFR, Estimated >60 >60 mL/min   Anion gap 8 5 - 15  Beta-hydroxybutyric acid   Collection Time: 01/05/22  8:51 PM  Result Value Ref Range   Beta-Hydroxybutyric Acid 0.10 0.05 - 0.27 mmol/L  Glucose, capillary   Collection Time: 01/05/22  9:32 PM  Result Value Ref Range   Glucose-Capillary 141 (H) 70 - 99 mg/dL  Glucose, capillary   Collection Time: 01/05/22 10:36 PM  Result Value Ref Range   Glucose-Capillary 96 70 - 99 mg/dL  Glucose, capillary   Collection Time: 01/05/22 11:44 PM  Result Value Ref Range   Glucose-Capillary 106 (H) 70 - 99 mg/dL  Basic metabolic panel   Collection Time: 01/06/22 12:42 AM  Result Value Ref Range   Sodium 135 135 - 145 mmol/L   Potassium 2.8 (L) 3.5 - 5.1 mmol/L   Chloride 108 98 - 111 mmol/L   CO2 20 (L) 22 - 32 mmol/L   Glucose, Bld 97 70 - 99 mg/dL    BUN 6 6 - 20 mg/dL   Creatinine, Ser 0.39 (L) 0.44 - 1.00 mg/dL   Calcium 7.9 (L) 8.9 - 10.3 mg/dL   GFR, Estimated >60 >60 mL/min   Anion gap 7 5 - 15  Glucose, capillary   Collection Time: 01/06/22 12:54 AM  Result Value Ref Range   Glucose-Capillary 88 70 - 99 mg/dL  Glucose, capillary   Collection Time: 01/06/22  2:00 AM  Result Value Ref Range   Glucose-Capillary 101 (H) 70 - 99 mg/dL  Glucose, capillary   Collection Time: 01/06/22  3:02 AM  Result Value Ref Range   Glucose-Capillary 98 70 - 99 mg/dL  Glucose, capillary   Collection Time: 01/06/22  4:01 AM  Result Value Ref Range   Glucose-Capillary 79 70 - 99 mg/dL  Basic metabolic panel   Collection Time: 01/06/22  5:06 AM  Result Value Ref Range   Sodium 137 135 - 145 mmol/L   Potassium 2.8 (L) 3.5 - 5.1 mmol/L   Chloride 106 98 - 111 mmol/L   CO2 20 (L) 22 - 32 mmol/L   Glucose, Bld 106 (H) 70 - 99 mg/dL   BUN 6 6 - 20 mg/dL   Creatinine, Ser 0.41 (L) 0.44 - 1.00 mg/dL   Calcium 8.1 (L) 8.9 - 10.3 mg/dL   GFR, Estimated >60 >60 mL/min   Anion gap 11 5 - 15  Beta-hydroxybutyric acid   Collection Time: 01/06/22  5:06  AM  Result Value Ref Range   Beta-Hydroxybutyric Acid 1.63 (H) 0.05 - 0.27 mmol/L  Glucose, capillary   Collection Time: 01/06/22  5:07 AM  Result Value Ref Range   Glucose-Capillary 103 (H) 70 - 99 mg/dL  Glucose, capillary   Collection Time: 01/06/22  6:05 AM  Result Value Ref Range   Glucose-Capillary 130 (H) 70 - 99 mg/dL  Glucose, capillary   Collection Time: 01/06/22  7:07 AM  Result Value Ref Range   Glucose-Capillary 127 (H) 70 - 99 mg/dL   Comment 1 Document in Chart   Glucose, capillary   Collection Time: 01/06/22  8:04 AM  Result Value Ref Range   Glucose-Capillary 121 (H) 70 - 99 mg/dL  Glucose, capillary   Collection Time: 01/06/22  9:06 AM  Result Value Ref Range   Glucose-Capillary 108 (H) 70 - 99 mg/dL  Basic metabolic panel   Collection Time: 01/06/22  9:12 AM  Result  Value Ref Range   Sodium 135 135 - 145 mmol/L   Potassium 2.6 (LL) 3.5 - 5.1 mmol/L   Chloride 106 98 - 111 mmol/L   CO2 19 (L) 22 - 32 mmol/L   Glucose, Bld 115 (H) 70 - 99 mg/dL   BUN 5 (L) 6 - 20 mg/dL   Creatinine, Ser 0.34 (L) 0.44 - 1.00 mg/dL   Calcium 8.0 (L) 8.9 - 10.3 mg/dL   GFR, Estimated >60 >60 mL/min   Anion gap 10 5 - 15  Magnesium   Collection Time: 01/06/22  9:12 AM  Result Value Ref Range   Magnesium 1.8 1.7 - 2.4 mg/dL  Glucose, capillary   Collection Time: 01/06/22 10:02 AM  Result Value Ref Range   Glucose-Capillary 121 (H) 70 - 99 mg/dL  Glucose, capillary   Collection Time: 01/06/22 11:11 AM  Result Value Ref Range   Glucose-Capillary 114 (H) 70 - 99 mg/dL  Glucose, capillary   Collection Time: 01/06/22 12:07 PM  Result Value Ref Range   Glucose-Capillary 97 70 - 99 mg/dL  Glucose, capillary   Collection Time: 01/06/22 12:44 PM  Result Value Ref Range   Glucose-Capillary 129 (H) 70 - 99 mg/dL  Beta-hydroxybutyric acid   Collection Time: 01/06/22  1:11 PM  Result Value Ref Range   Beta-Hydroxybutyric Acid 0.80 (H) 0.05 - 0.27 mmol/L  Glucose, capillary   Collection Time: 01/06/22  5:56 PM  Result Value Ref Range   Glucose-Capillary 166 (H) 70 - 99 mg/dL  Glucose, capillary   Collection Time: 01/06/22  9:38 PM  Result Value Ref Range   Glucose-Capillary 82 70 - 99 mg/dL  Basic metabolic panel   Collection Time: 01/07/22  4:21 AM  Result Value Ref Range   Sodium 136 135 - 145 mmol/L   Potassium 3.4 (L) 3.5 - 5.1 mmol/L   Chloride 108 98 - 111 mmol/L   CO2 20 (L) 22 - 32 mmol/L   Glucose, Bld 84 70 - 99 mg/dL   BUN <5 (L) 6 - 20 mg/dL   Creatinine, Ser <0.30 (L) 0.44 - 1.00 mg/dL   Calcium 8.1 (L) 8.9 - 10.3 mg/dL   GFR, Estimated NOT CALCULATED >60 mL/min   Anion gap 8 5 - 15  Glucose, capillary   Collection Time: 01/07/22  5:55 AM  Result Value Ref Range   Glucose-Capillary 92 70 - 99 mg/dL  Glucose, capillary   Collection Time:  01/07/22 10:44 AM  Result Value Ref Range   Glucose-Capillary 129 (H) 70 - 99 mg/dL  Results for orders placed or performed in visit on 01/05/22 (from the past 168 hour(s))  Glucose, capillary   Collection Time: 01/05/22 10:57 AM  Result Value Ref Range   Glucose-Capillary 306 (H) 70 - 99 mg/dL   DG Foot Complete Left  Result Date: 12/10/2021 CLINICAL DATA:  Left foot pain. EXAM: LEFT FOOT - COMPLETE 3+ VIEW COMPARISON:  None. FINDINGS: There is no acute fracture or dislocation. The bones are osteopenic. No significant arthritic changes. The soft tissues are unremarkable. IMPRESSION: No acute fracture or dislocation. Electronically Signed   By: Anner Crete M.D.   On: 12/10/2021 01:02    Future Appointments  Date Time Provider [place]  01/15/2022  8:15 AM Aletha Halim, MD [facility]  01/16/2022  1:05 PM MC-CV [facility] ECHO 5 [facility]ECHO [facility]  02/02/2022 10:15 AM [facility]-MFC NURSE [facility]  02/02/2022 10:30 AM WMC-MFC US3 [facility]  03/27/2022  2:35 PM Tobb, Godfrey Pick, DO CVD-WMC None    Discharge Condition: Stable  Discharge disposition: 01-Home or Self Care        Allergies as of 01/07/2022       Reactions   Pork-derived Products    Pt does not want any pork products        Medication List     STOP taking these medications    insulin lispro 100 UNIT/ML KwikPen Commonly known as: HumaLOG KwikPen   Lantus SoloStar 100 UNIT/ML Solostar Pen Generic drug: insulin glargine   TechLite Pen Needles 32G X 4 MM Misc Generic drug: Insulin Pen Needle   terconazole 0.4 % vaginal cream Commonly known as: TERAZOL 7       TAKE these medications    Accu-Chek Guide test strip Generic drug: glucose blood Use as instructed; check blood glucose 4 times daily   Accu-Chek Guide w/Device Kit 1 Device by Does not apply route 4 (four) times daily. What changed:  medication strength how much to take how to take this when to take this additional  instructions   Accu-Chek Softclix Lancets lancets Use as instructed; check blood glucose 4 times daily   acetaminophen 500 MG tablet Commonly known as: TYLENOL Take 1,000 mg by mouth every 6 (six) hours as needed.   Aspirin Low Dose 81 MG chewable tablet Generic drug: aspirin CHEW 1 TABLET BY MOUTH DAILY.   insulin aspart 100 UNIT/ML FlexPen Commonly known as: NOVOLOG Inject 4 Units into the skin 3 (three) times daily with meals.   labetalol 100 MG tablet Commonly known as: NORMODYNE Take 100 mg by mouth 2 (two) times daily.   Levemir FlexPen 100 UNIT/ML FlexPen Generic drug: insulin detemir Inject 12 Units into the skin 2 (two) times daily.   metoCLOPramide 10 MG tablet Commonly known as: Reglan Take 1 tablet (10 mg total) by mouth 4 (four) times daily as needed for nausea or vomiting.   NIFEdipine 90 MG 24 hr tablet Commonly known as: ADALAT CC Take 90 mg by mouth daily.   ondansetron 4 MG disintegrating tablet Commonly known as: ZOFRAN-ODT Take 1 tablet (4 mg total) by mouth every 6 (six) hours as needed for nausea.   potassium chloride SA 20 MEQ tablet Commonly known as: KLOR-CON M Take 2 tablets (40 mEq total) by mouth daily for 5 days.   Prenatal Vitamin 27-0.8 MG Tabs Take 1 tablet by mouth daily.   scopolamine 1 MG/3DAYS Commonly known as: TRANSDERM-SCOP Place 1 patch (1.5 mg total) onto the skin every 3 (three) days. Start taking on: January 09, 2022         Total discharge time: 45 minutes   Signed: Verita Schneiders M.D. 01/07/2022, 10:46 AM

## 2022-01-08 ENCOUNTER — Telehealth: Payer: Self-pay

## 2022-01-08 NOTE — Telephone Encounter (Signed)
Transition Care Management Unsuccessful Follow-up Telephone Call  Date of discharge and from where:  01/07/2022-[facility]  Attempts:  1st Attempt  Reason for unsuccessful TCM follow-up call:  Left voice message

## 2022-01-09 NOTE — Telephone Encounter (Signed)
Transition Care Management Unsuccessful Follow-up Telephone Call  Date of discharge and from where:  01/07/2022-[facility]   Attempts:  2nd Attempt  Reason for unsuccessful TCM follow-up call:  Left voice message

## 2022-01-12 NOTE — Telephone Encounter (Signed)
Transition Care Management Follow-up Telephone Call Date of discharge and from where: 01/07/2022 from [facility] How have you been since you were released from the hospital? Patient stated that she is feeling well and did not have any questions or concerns at this time.  Any questions or concerns? No  Items Reviewed: Did the pt receive and understand the discharge instructions provided? Yes  Medications obtained and verified? Yes  Other? No  Any new allergies since your discharge? No  Dietary orders reviewed? No Do you have support at home? Yes   Functional Questionnaire: (I = Independent and D = Dependent) ADLs: I  Bathing/Dressing- I  Meal Prep- I  Eating- I  Maintaining continence- I  Transferring/Ambulation- I  Managing Meds- I   Follow up appointments reviewed:  PCP Hospital f/u appt confirmed? No   Specialist Hospital f/u appt confirmed? Yes  Scheduled to [facility] on 01/15/2022 @ 08:15am. Are transportation arrangements needed? No  If their condition worsens, is the pt aware to call PCP or go to the Emergency Dept.? Yes Was the patient provided with contact information for the PCP's office or ED? Yes Was to pt encouraged to call back with questions or concerns? Yes

## 2022-01-15 ENCOUNTER — Other Ambulatory Visit: Payer: Self-pay

## 2022-01-15 ENCOUNTER — Ambulatory Visit (INDEPENDENT_AMBULATORY_CARE_PROVIDER_SITE_OTHER): Payer: Medicaid Other | Admitting: Obstetrics and Gynecology

## 2022-01-15 VITALS — BP 136/82 | HR 93 | Wt 117.5 lb

## 2022-01-15 DIAGNOSIS — Z6791 Unspecified blood type, Rh negative: Secondary | ICD-10-CM

## 2022-01-15 DIAGNOSIS — D563 Thalassemia minor: Secondary | ICD-10-CM

## 2022-01-15 DIAGNOSIS — O26899 Other specified pregnancy related conditions, unspecified trimester: Secondary | ICD-10-CM

## 2022-01-15 DIAGNOSIS — Z789 Other specified health status: Secondary | ICD-10-CM

## 2022-01-15 DIAGNOSIS — O09219 Supervision of pregnancy with history of pre-term labor, unspecified trimester: Secondary | ICD-10-CM

## 2022-01-15 DIAGNOSIS — O24012 Pre-existing diabetes mellitus, type 1, in pregnancy, second trimester: Secondary | ICD-10-CM

## 2022-01-15 DIAGNOSIS — Z3A15 15 weeks gestation of pregnancy: Secondary | ICD-10-CM

## 2022-01-15 DIAGNOSIS — Z3A16 16 weeks gestation of pregnancy: Secondary | ICD-10-CM

## 2022-01-15 DIAGNOSIS — O10919 Unspecified pre-existing hypertension complicating pregnancy, unspecified trimester: Secondary | ICD-10-CM

## 2022-01-15 MED ORDER — INSULIN GLARGINE 100 UNIT/ML SOLOSTAR PEN: 15.0000 [IU] | PEN_INJECTOR | Freq: Two times a day (BID) | SUBCUTANEOUS | 11 refills | Status: DC

## 2022-01-15 MED ORDER — INSULIN ASPART 100 UNIT/ML FLEXPEN: 7.0000 [IU] | PEN_INJECTOR | Freq: Three times a day (TID) | SUBCUTANEOUS | 11 refills | Status: DC

## 2022-01-15 NOTE — Patient Instructions (Addendum)
[facility] with Dr. Gevena Cotton    The appointment time :   01-22-22 @ 1:00 pm   Address :    [address], [place], [place] [address]   Please arrive 15 minutes before the appointment to check in.   If you can't keep this appointment please call [phone]

## 2022-01-15 NOTE — Progress Notes (Signed)
PRENATAL VISIT NOTE  Subjective:  Kristin Clay is a 36 y.o. [phone] at [redacted]w[redacted]d being seen today for ongoing prenatal care.  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  Patient reports no complaints.  Contractions: Not present. Vag. Bleeding: None.  Movement: Absent. Denies leaking of fluid.   The following portions of the patient's history were reviewed and updated as appropriate: allergies, current medications, past family history, past medical history, past social history, past surgical history and problem list.   Objective:   Vitals:   01/15/22 0827 01/15/22 0840  BP: (!) 142/86 136/82  Pulse: 89 93  Weight: 117 lb 8 oz (53.3 kg)     Fetal Status: Fetal Heart Rate (bpm): 152   Movement: Absent     General:  Alert, oriented and cooperative. Patient is in no acute distress.  Skin: Skin is warm and dry. No rash noted.   Cardiovascular: Normal heart rate noted  Respiratory: Normal respiratory effort, no problems with respiration noted  Abdomen: Soft, gravid, appropriate for gestational age.  Pain/Pressure: Absent     Pelvic: Cervical exam deferred        Extremities: Normal range of motion.  Edema: None   Mental Status: Normal mood and affect. Normal behavior. Normal judgment and thought content.   Assessment and Plan:  Pregnancy: [id number] at [redacted]w[redacted]d 1. Supervision of high risk pregnancy Continue low dose asa Pt has mfm u/s for 3/13 - AFP, Serum, Open Spina Bifida  2. [redacted] weeks gestation of pregnancy  3. Chronic hypertension affecting pregnancy Continue procardia 90 qday and labetalol 100 bid I confirmed with her her maternal echo for tomorrow. She has a 5/5 f/u appt with Dr Servando Salina  4. 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, Pt confirms on lantus 12u at 0800 and 12u 2000 and humalog 4u tidac AM fastings in the 180s and 2 hour PP breakfast 170s, 2 hour pp lunch 130s-150s and 2 hour dinner 160s.    Will increase to lantus 15 bid and humalog 7 tidac  Request made to have f/u visit with DM education  I confirmed with her her [facility] appt for 3/23 at 10am  5. Rh negative, antepartum Rhogam and ab screen at 28wks  6. Previous preterm delivery at 33w6 due to severe preeclampsia, antepartum See above  7. Language barrier Interpreter used  8. Alpha thalassemia silent carrier D/w pt. RN to look into partner testing for her.   Preterm labor symptoms and general obstetric precautions including but not limited to vaginal bleeding, contractions, leaking of fluid and fetal movement were reviewed in detail with the patient. Please refer to After Visit Summary for other counseling recommendations.   Return in about 1 week (around 01/22/2022) for md visit, in person or virtual, blood sugar review.  Future Appointments  Date Time Provider Department Center  01/16/2022  1:05 PM MC-CV [facility] ECHO 5 [facility]SITE3ECHO [facility]  02/02/2022 10:15 AM W[facility]-M[facility] NURSE [facility]  02/02/2022 10:30 AM W[facility]-MFC US3 [facility]  03/27/2022  2:35 PM Tobb, Godfrey Pick, DO CVD-WMC None    Aletha Halim, MD

## 2022-01-16 ENCOUNTER — Ambulatory Visit (HOSPITAL_COMMUNITY): Payer: Medicaid Other | Attending: Cardiology

## 2022-01-16 ENCOUNTER — Encounter (HOSPITAL_COMMUNITY): Payer: Self-pay | Admitting: Cardiology

## 2022-01-17 LAB — AFP, SERUM, OPEN SPINA BIFIDA
AFP MoM: 0.98
AFP Value: 38 ng/mL
Gest. Age on Collection Date: 16.5 weeks
Maternal Age At EDD: 37 yr
OSBR Risk 1 IN: 8001
Test Results:: NEGATIVE
Weight: 117 [lb_av]

## 2022-01-22 ENCOUNTER — Ambulatory Visit (INDEPENDENT_AMBULATORY_CARE_PROVIDER_SITE_OTHER): Payer: Medicaid Other | Admitting: Obstetrics and Gynecology

## 2022-01-22 ENCOUNTER — Encounter: Payer: Self-pay | Admitting: Obstetrics and Gynecology

## 2022-01-22 ENCOUNTER — Other Ambulatory Visit: Payer: Self-pay

## 2022-01-22 ENCOUNTER — Encounter: Payer: Medicaid Other | Admitting: Advanced Practice Midwife

## 2022-01-22 VITALS — BP 124/81 | HR 89 | Wt 116.8 lb

## 2022-01-22 DIAGNOSIS — H538 Other visual disturbances: Secondary | ICD-10-CM | POA: Diagnosis not present

## 2022-01-22 DIAGNOSIS — O24012 Pre-existing diabetes mellitus, type 1, in pregnancy, second trimester: Secondary | ICD-10-CM

## 2022-01-22 DIAGNOSIS — O10919 Unspecified pre-existing hypertension complicating pregnancy, unspecified trimester: Secondary | ICD-10-CM

## 2022-01-22 DIAGNOSIS — Z6791 Unspecified blood type, Rh negative: Secondary | ICD-10-CM

## 2022-01-22 DIAGNOSIS — O09299 Supervision of pregnancy with other poor reproductive or obstetric history, unspecified trimester: Secondary | ICD-10-CM

## 2022-01-22 DIAGNOSIS — O0990 Supervision of high risk pregnancy, unspecified, unspecified trimester: Secondary | ICD-10-CM

## 2022-01-22 DIAGNOSIS — O26899 Other specified pregnancy related conditions, unspecified trimester: Secondary | ICD-10-CM

## 2022-01-22 NOTE — Progress Notes (Signed)
Subjective:  ?Kristin Clay is a 36 y.o. [phone] at 64w5dbeing seen today for ongoing prenatal care.  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 ? ?Patient reports no complaints.  Contractions: Not present. Vag. Bleeding: None.  Movement: Absent. Denies leaking of fluid.  ? ?The following portions of the patient's history were reviewed and updated as appropriate: allergies, current medications, past family history, past medical history, past social history, past surgical history and problem list. Problem list updated. ? ?Objective:  ? ?Vitals:  ? 01/22/22 1019  ?BP: 124/81  ?Pulse: 89  ?Weight: 116 lb 12.8 oz (53 kg)  ? ? ?Fetal Status: Fetal Heart Rate (bpm): 152   Movement: Absent    ? ?General:  Alert, oriented and cooperative. Patient is in no acute distress.  ?Skin: Skin is warm and dry. No rash noted.   ?Cardiovascular: Normal heart rate noted  ?Respiratory: Normal respiratory effort, no problems with respiration noted  ?Abdomen: Soft, gravid, appropriate for gestational age. Pain/Pressure: Absent     ?Pelvic:  Cervical exam deferred        ?Extremities: Normal range of motion.  Edema: None  ?Mental Status: Normal mood and  affect. Normal behavior. Normal judgment and thought content.  ? ?Urinalysis:     ? ?Assessment and Plan:  ?Pregnancy: [id number] 155w5d ?1. Supervision of high risk pregnancy, antepartum ?Stable ?Anatomy scan ordered ? ?2. Chronic hypertension affecting pregnancy ?BP stable on current regiment ? ? ?3. 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 ?Pt reports following diet and taking insulin ?Will increase insulin: Lantus 20 units 0800 and 2000   Humalog 12 units tid AC ?Pt did not bring CBG readings. Importance of bringing CBG readings to each office visit ? ?4. Rh negative, antepartum ?Rhogam as indicated ? ?5. History of severe preeclampsia, prior pregnancy, currently pregnant ?See above ? ?6. Silent carrier for alpha thal ?Kit for partner testing provided to pt  ? ?Preterm labor symptoms and general obstetric precautions including but not limited to vaginal bleeding, contractions, leaking of fluid and fetal movement were reviewed in detail with the patient. ?Please refer to After Visit Summary for other counseling recommendations.  ?Return in about 2 weeks (around 02/05/2022) for OB visit, face to face, MD only. ? ? ?ErChancy MilroyMD ?

## 2022-01-22 NOTE — Patient Instructions (Signed)
????? ?????? ?? ????? ?Second Trimester of Pregnancy ? ????? ????? ?????? ?? ????? ?? ??????? ?????? ??? ??? ??????? ?????? ????????. ????? ??? ??? ?? ????? ?????? ??? ????? ??????. ??????? ?? ???? ????? ?????? ?? ????? ??? ?????? ??? ??? ???? ?????. ????? ????? ???? ?? ???? ?? ???? ?????? ??????? ?? ?????? ??????? ??????. ????? ????? ?????? ?? ??????: ? ????? ?? ??? ????? ??????? ?? ????? ??????. ? ???? ?????? ???? ???? ?????. ? ????? ?????? ?????? ?????. ???? ????? ?????? ?? ?????? ???? ???? (?????) ?????. ??? ????? ????? ??????? ?? ??? ??? ?????? ??? 12 ???? ????? ??? ?1 ??? ???????. ?? ?????? ?? ????? ?? ?????? ????? ?????? (?????) ??? ???????? 16 ?20 ?? ?????. ???????? ????? ?? ????? ????? ?????? ?????? ?????? ?? ?????????? ?? ?????? ??? ???? ???? ????? ?????? ?? ?????. ???? ???????? ?????? ???? ???? ???? ???? ??? ?????? ??? ???????. ??????? ????? ? ?????? ????? ?? ???????. ??? ??????? ???? ???? ????? ??????. ? ??? ???? ???? ?????? ?????? ??? ??????? ?????? ????????. ? ??? ????? ??? ?????? ?? ??????? ?????? ???? ????? ??? ?????. ? ?? ???? ???? ?? ?????? ????? (????? ?? ???? ?????) ??? ?????. ? ?? ???? ??? ???? ????? ?? ????? ????? ??? ?????? (??????? ?????????). ? ?? ???? ???????? ?? ?????. ????? ?? ???? ??? ?????????? ????? ???? ????? ??????? ??????????? ?? ?????. ?? ????? ??? ?????? ????? ?? ????? ?????? ?? ????? ????? ?? ????? ?? ???? ???? ????? ????? ?? ??????. ??????? ???? ? ?? ?????? ???????. ? ?? ?????? ?? ???? ?? ?? ??????. ? ?? ?????? ?????????. ? ?? ?????? ?? ???????? ?? ???? ???????? ????????? (??????? ????????). ? ?? ???? ?????? ??? ???? ??????? ?????? ??????? ???????? ??????. ? ?? ??????? ?????? ????? ???? ?????? ??? ???????. ? ?? ?????? ???? ??? ?? ??????. ???? ??? ????: ?? ????? ?? ?????. ?? ??????? ????? ???? ???? ??????? ??????? ?? ?????. ?? ???? ?? ????? ???????? ???? ???? ??????. ?????? ????????? ??????? ?? ???????: ???????? ? ????? ??????? ????? ??????? ?????? ????? ????? ???????. ??? ?????  ?? ???? ???? ?? ?? ??? ????? ??????? ?? ????? ?????. ??? ?? ??????? ????? ????? ??? ??? ?????? ???? ??????? ??????. ? ?????? ????????? ?? ??? ??????? ???? ????? ??? 600 ????????? (mcg) ?? ??? ???????. ?????? ?????? ? ????? ?????? ??????? ????? ????? ??? ????? ????? ???????? ????? ????? ?????? ???? ???????? ??? ??????? ?????? ??????? ??????? ??????? ????? ?????. ? ????? ?????? ?????? ???????? ??? ???????? ?? ??????? ??????? ??????. ???? ????? ????? ??? ?????? ???? ?? ???? ???? ?????. ? ????? ??????? ??? ????? ??? ????????? ??????? ?? ???????? ?? ????? ?? ???? ?????: ?? ????? ????? ????? ?? ??????? ???? ??? ????? ???? ??????.  ?? ?????? ??????? ?????? ???????? ??? ????????? ??????? ??????? ???????? ?????????? ???????. ?? ???? ????? ??????? ???? ????? ??? ??? ????? ?? ?????? ????????? ??????? ??? ??????? ??????? ?? ????????. ??????? ? ????? ??????? ??? ??????? ???? ??????? ??????. ???? ????? ?????? ??????? ????????? ?? ????? ???? ???????? ??????? ??? ?? ????? ?????. ????? ?????? ??????? 30 ????? ??? ????? ?????? ?? 5 ???? ?? ???????. ????? ?? ?????? ???????? ???????? ??? ????? ????????? ?? ?????. ? ????? ?? ?????? ???????? ??? ???? ???? ?? ?????? ?? ????? ????? ?? ???? ?????. ? ????? ?????? ???????? ???????? ??? ??? ???? ????? ???? ?? ????? ???? ?? ??? ???? ?? ????? ????? ?? ??? ?????. ? ????? ??? ??????? ???????. ? ??? ?????? ?????? ?????? ??????? ???????? ?? ?? ???? ???? ???? ??????? ?????? ??????? ??? ?????? ????. ?????? ?????? ?????? ???? ???????? ? ????? ????? ??? ???? ??? ????? ????? ????? ?????? ???? ?? ?????. ? ??? ?????? ????? ????? ?????? ????? ?? ??????? ?????? ?? ????????. ??????? ???? ??????? ??? ???? ???? ??????? ?????? ??? ???. ? ??????? ?? ??? (?????) ????? ??? ???? ?????? ?? ?????? ????? ?? ???? ?? ???? ?????. ? ??? ?????? ???????? ????????: ?? ????? ??????? ??????? ??????? ??? ??????? ???? ??????? ??????. ?? ????? ????? ????  15 ????? ?? ??? 3-4 ???? ??????. ?? ???? ????? ?? ??????  ???????. ???????? ? ????? ???? ?????? ???? ??? ??????? ?????? ?? ????? ????. ? ??????? ??????? ??????? ?????? ??????? ??? ??? ???? ??????? ??????? ????? ?? ?????? ????. ???? ?????? ? ?? ??????? ????? ?????? ??????? ?? ??? ?????? ?? ???????. ? ?? ??????? ???? ???????. ?? ??????? ???????? ??????? ???????? ??? ????? ?????? ???????. ? ????? ???????? ?? ?????? ????? ????? ??????? ????????? ?? ??? ?????. ???? ??????? ???? ?????? ???? ?? ???? ?????? ????? ?????? ?? ?????? ?????? ?? ???? ??????? ?? ????? ???? ???. ? ?? ??????? ???????? ??????? ?? ?????? ?? ???????? ??? ????????? ?? ??????? ???? ?? ????? ????? ???? ??????? ?????? ??????? ???. ?????? ?? ?????????? ???????? ?? ??? ???????? ?? ??? ?????. ? ?? ??????? ?? ?????? ?????? ????? ??? ????????? ?? ????? ??? ??????? ???????? ??????????? ???? ?????. ???????? ???? ??????? ?????? ??? ????? ??? ???????? ??????? ?? ???????. ???????? ???? ? ???? ???????? ??????? ??????? (?????? ?????? ?????)? ????? ????? ??????? ?????? ??? ????? ?????? ????? ????. ??????? ???? ??????? ?????? ???? ????? ???? ??????. ?????? ????? ?????? ????????. ???? ??? ???. ? ????? ?? ???? ??????? ?????? ????? ??? ?????? ???? ?????? ?? ??? ???????. ? ????? ???????? ??? ????? ??? ??????? ?? ??? ???? ???????? ?????? ?? ????? ???? ?????. ???? ?? ???? ??? ???? ??????? ?????? ??????? ???????? ?? ????? ??? ???????? ???????? ?? ????? ?????????. ?????? ?????? ??? ?????? ?? ?????????: ? ??????? ????????? ????? (American Pregnancy Association):? [email or web address] ? ?????? ????????? ??? ?????? ???????? Lexicographer of Obstetricians and Gynecologists):?? [email or web address].pt ? ???? ??? ?????? (Office on [employer]):? [email or web address] ?????? ???????? ??????? ?????? ??? ????? ????? ??????? ???????: ? ???? ???? ?? ???? ??? ?? ????? ??????. ? ?????? ?? ?????? ??? ??????? ?? ???? ??? ?? ???? ?????. ? ?????? ????? ?? ????? ????? ?? ??? ?? ?????? ?? ??? ???? ?? ????? ?????. ? ????? ??  ??? ?? ????? ?? ??????? ???. ? ??????? ?????? ????? ???????? ?? ??? ???? ???????. ? ???? ??? ??? ??????. ? ???? ????? ?? ???? ?? ????? ?? ?????? ?? ???????? ?? ??????? ?? ???????. ? ??????? ???????. ????? ???????? ????? ?? ???????? ???????: ? ???? ???? ?? ??????. ? ???????? ?? ????? ?? ???? ?????. ? ???????? ?? ???? ?? ?????? ???? ?? ?????. ? ????? ?? ??????. ? ??????? ???? ?? ?????. ? ?????? ?????? ?????. ? ??? ?????? ????? ????? ???????? ??? ?????? ???? ?????? ??? ???? ??????? ?????? ??????? ???. ? ?????? ???? ?? ???? ?? ?????? ?? ?????? ?? ?????? ???? ???? ??? ?????? ????? ????? ?? ?????? ?? ??? ?????? ????. ????? ? ???? ????? ?????? ?? ????? ?? ??????? ?????? ??? ??? ??????? ?????? ???????? (?? ????? ?????? ??? ????? ??????). ? ?? ??????? ???????? ??????? ?? ?????? ?? ???????? ??? ????????? ?? ??????? ???? ?? ????? ????? ???? ??????? ?????? ??????? ???. ?????? ?? ?????????? ???????? ?? ??? ???????? ?? ??? ?????. ? ????? ??????? ??? ??????? ???? ??????? ??????. ???? ????? ?????? ??????? ????????? ?? ????? ???? ???????? ??????? ??? ?? ????? ?????. ? ?????? ????? ?????? ????????. ???? ??? ???. ???? ????? ?? ??? ????????? ?? ???? ?????? ????????? ???? ?????? ???? ??????? ??????. ???? ?? ?????? ??? ????? ???? ?? ???? ?? ???? ??????? ??????.? ?Document Revised: 05/07/2020 Document Reviewed: 05/07/2020 ?Elsevier Patient Education ? [employer]. ? ?

## 2022-01-22 NOTE — Progress Notes (Deleted)
Subjective:  ?Kristin Clay is a 36 y.o. [phone] at [redacted]w[redacted]d being seen today for ongoing prenatal care.  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 ? ?Patient reports no complaints.  Contractions: Not present. Vag. Bleeding: None.  Movement: Absent. Denies leaking of fluid.  ? ?The following portions of the patient's history were reviewed and updated as appropriate: allergies, current medications, past family history, past medical history, past social history, past surgical history and problem list. Problem list updated. ? ?Objective:  ? ?Vitals:  ? 01/22/22 1019  ?BP: 124/81  ?Pulse: 89  ?Weight: 116 lb 12.8 oz (53 kg)  ? ? ?Fetal Status: Fetal Heart Rate (bpm): 152   Movement: Absent    ? ?General:  Alert, oriented and cooperative. Patient is in no acute distress.  ?Skin: Skin is warm and dry. No rash noted.   ?Cardiovascular: Normal heart rate noted  ?Respiratory: Normal respiratory effort, no problems with respiration noted  ?Abdomen: Soft, gravid, appropriate for gestational age. Pain/Pressure: Absent     ?Pelvic:  Cervical exam deferred        ?Extremities: Normal range of motion.  Edema: None  ?Mental Status: Normal mood and  affect. Normal behavior. Normal judgment and thought content.  ? ?Urinalysis:     ? ?Assessment and Plan:  ?Pregnancy: [id number] at [redacted]w[redacted]d ? ?There are no diagnoses linked to this encounter. ?{Blank single:19197::"Term","Preterm"} labor symptoms and general obstetric precautions including but not limited to vaginal bleeding, contractions, leaking of fluid and fetal movement were reviewed in detail with the patient. ?Please refer to After Visit Summary for other counseling recommendations.  ?Return in about 2 weeks (around 02/05/2022) for OB visit, face to face, MD only. ? ? ?Hermina Staggers, MD ?

## 2022-01-23 NOTE — Progress Notes (Signed)
Office Visit Note   Patient: Kristin Clay           Date of Birth: [birth year]           MRN: [id number] Visit Date: 12/12/2021              Requested by: Georganna Skeans, MD [address] [place],  [place] [address] PCP: Georganna Skeans, MD  Chief Complaint  Patient presents with   Left Ankle - Follow-up       HPI: The patient is a 36 year old woman who is currently pregnant is seen today in follow up for injury to the left ankle. Initial injury was about 2 weeks ago. She had a fall when she missed a step and twisted her left ankle.  She did have radiographs of the ankle and knee on December 30. these were negative. Radiographs with stress in office reassuring 2 w ago.   She continues to have significant difficulty bearing weight in her CAM walker due to pain.  Assessment & Plan: Visit Diagnoses:  1. Injury of left ankle, initial encounter     Plan: Concern for syndesmosis injury.   Discussed case with Dr. August Saucer, considered C-arm imaging to evaluate syndesmosis. Due to pregnancy will send for MRI of left ankle.  Continue CAM walker and crutches.   Follow-Up Instructions: No follow-ups on file.   Left Ankle Exam   Tenderness  The patient is experiencing tenderness in the ATF, CF, deltoid and lateral malleolus.  Swelling: moderate  Other  Erythema: absent Pulse: present     Patient is alert, oriented, no adenopathy, well-dressed, normal affect, normal respiratory effort.   Imaging: No results found. No images are attached to the encounter.  Labs: Lab Results  Component Value Date   HGBA1C 10.4 (H) 12/15/2021   HGBA1C 10.4 (A) 12/09/2021   HGBA1C 12.6 (A) 09/05/2021   REPTSTATUS 06/25/2021 FINAL 06/24/2021   CULT (A) 06/24/2021    GROUP B STREP(S.AGALACTIAE)ISOLATED TESTING AGAINST S. AGALACTIAE NOT ROUTINELY PERFORMED DUE TO PREDICTABILITY OF AMP/PEN/VAN SUSCEPTIBILITY. Performed at [facility], [address],  [place], [place] [address]    LABORGA STAPHYLOCOCCUS CAPITIS (A) 05/22/2021     Lab Results  Component Value Date   ALBUMIN 3.6 01/05/2022   ALBUMIN 3.8 12/15/2021   ALBUMIN 2.5 (L) 07/04/2021    Lab Results  Component Value Date   MG 1.8 01/06/2022   MG 2.0 10/28/2020   MG 1.7 10/27/2020   No results found for: VD25OH  No results found for: PREALBUMIN CBC EXTENDED Latest Ref Rng & Units 01/05/2022 12/15/2021 11/21/2021  WBC 4.0 - 10.5 K/uL 7.7 3.0(L) 5.3  RBC 3.87 - 5.11 MIL/uL 5.62(H) 5.00 4.82  HGB 12.0 - 15.0 g/dL 15.6(H) 14.1 13.8  HCT 36.0 - 46.0 % 48.0(H) 42.9 40.5  PLT 150 - 400 K/uL 283 238 240  NEUTROABS 1.4 - 7.0 x10E3/uL - 1.9 -  LYMPHSABS 0.7 - 3.1 x10E3/uL - 0.9 -     There is no height or weight on file to calculate BMI.  Orders:  Orders Placed This Encounter  Procedures   MR Ankle Left w/o contrast   XR C-ARM NO REPORT   No orders of the defined types were placed in this encounter.    Procedures: No procedures performed  Clinical Data: No additional findings.  ROS:  All other systems negative, except as noted in the HPI. Review of Systems  Objective: Vital Signs: LMP 09/20/2021   Specialty Comments:  No specialty comments available.  PMFS History: Patient Active Problem List   Diagnosis Date Noted   Hypokalemia 01/06/2022   Alpha thalassemia silent carrier 01/05/2022   DKA, type 1 (HCC) 01/05/2022   Atypical chest pain 01/04/2022   Blurry vision 01/04/2022   DM type 1 causing eye disease ([facility]) 01/04/2022   Shortness of breath 01/04/2022   Proteinuria affecting pregnancy 12/16/2021   Chronic hypertension affecting pregnancy 12/15/2021   Short interval between pregnancies affecting pregnancy, antepartum 12/15/2021   History of severe preeclampsia, prior pregnancy, currently pregnant 12/15/2021   Previous preterm delivery at 33w6 due to severe preeclampsia, antepartum 12/15/2021   History of forceps delivery in prior pregnancy,  currently pregnant 12/15/2021   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, 12/11/2021   Supervision of high risk pregnancy, antepartum 11/27/2021   Rh negative, antepartum 11/27/2021   Poor compliance 08/04/2021   Hypertension 08/04/2021   Language barrier 01/11/2020   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   Past Medical History:  Diagnosis Date   Chronic hypertension    Chronic hypertension with superimposed severe preeclampsia 06/23/2021   Complication of anesthesia    with hand surgery, local anes did not work, tried twice- had to put her to sleep   Diabetes mellitus type 1 (HCC)    DKA (diabetic ketoacidosis) (HCC) 10/2020   Epidural hematoma 07/04/2021   GBS bacteriuria 06/09/2021   Obstetric vaginal laceration with type 3c third degree perineal laceration 06/29/2021   Poor fetal growth complicating pregnancy, antepartum, third trimester, other fetus 06/19/2021   Pyelonephritis 10/2020   Rh negative status during pregnancy 05/01/2021   s/p Rhogam 05/13/21   Type 1 diabetes mellitus with ketoacidosis, uncontrolled (HCC) 02/22/2020   Type I diabetes mellitus (HCC)    dx 32yrs ago   UTI (urinary tract infection)     Family History  Problem Relation Age of Onset   Hyperlipidemia Mother    Hypertension Mother    Kidney disease Father    Alzheimer's disease Father     Past Surgical History:  Procedure Laterality Date   female circumcision Bilateral    clitorectomy as well   FOOT SURGERY  2018   HAND SURGERY  2019   Social History   Occupational History   Not on file  Tobacco Use   Smoking status: Never   Smokeless tobacco: Never  Vaping Use   Vaping Use: Never used  Substance and Sexual Activity   Alcohol use: Never   Drug use: Never   Sexual activity: Yes    Birth control/protection: None

## 2022-01-26 ENCOUNTER — Other Ambulatory Visit: Payer: Self-pay

## 2022-01-26 ENCOUNTER — Encounter (HOSPITAL_COMMUNITY): Payer: Self-pay | Admitting: Family Medicine

## 2022-01-26 ENCOUNTER — Inpatient Hospital Stay (HOSPITAL_COMMUNITY)
Admission: AD | Admit: 2022-01-26 | Discharge: 2022-01-26 | Disposition: A | Discharge status: Home / Self Care | Payer: Medicaid Other | Attending: Family Medicine | Admitting: Family Medicine

## 2022-01-26 DIAGNOSIS — M545 Low back pain, unspecified: Secondary | ICD-10-CM | POA: Insufficient documentation

## 2022-01-26 DIAGNOSIS — Z794 Long term (current) use of insulin: Secondary | ICD-10-CM | POA: Diagnosis not present

## 2022-01-26 DIAGNOSIS — O26892 Other specified pregnancy related conditions, second trimester: Secondary | ICD-10-CM | POA: Insufficient documentation

## 2022-01-26 DIAGNOSIS — E109 Type 1 diabetes mellitus without complications: Secondary | ICD-10-CM | POA: Diagnosis not present

## 2022-01-26 DIAGNOSIS — O219 Vomiting of pregnancy, unspecified: Secondary | ICD-10-CM | POA: Diagnosis not present

## 2022-01-26 DIAGNOSIS — Z79899 Other long term (current) drug therapy: Secondary | ICD-10-CM | POA: Diagnosis not present

## 2022-01-26 DIAGNOSIS — O09522 Supervision of elderly multigravida, second trimester: Secondary | ICD-10-CM | POA: Diagnosis not present

## 2022-01-26 DIAGNOSIS — O24012 Pre-existing diabetes mellitus, type 1, in pregnancy, second trimester: Secondary | ICD-10-CM | POA: Insufficient documentation

## 2022-01-26 DIAGNOSIS — Z3A18 18 weeks gestation of pregnancy: Secondary | ICD-10-CM

## 2022-01-26 DIAGNOSIS — O10919 Unspecified pre-existing hypertension complicating pregnancy, unspecified trimester: Secondary | ICD-10-CM

## 2022-01-26 DIAGNOSIS — R109 Unspecified abdominal pain: Secondary | ICD-10-CM | POA: Diagnosis not present

## 2022-01-26 DIAGNOSIS — O10912 Unspecified pre-existing hypertension complicating pregnancy, second trimester: Secondary | ICD-10-CM | POA: Insufficient documentation

## 2022-01-26 LAB — COMPREHENSIVE METABOLIC PANEL
ALT: 9 U/L (ref 0–44)
AST: 13 U/L — ABNORMAL LOW (ref 15–41)
Albumin: 3.4 g/dL — ABNORMAL LOW (ref 3.5–5.0)
Alkaline Phosphatase: 65 U/L (ref 38–126)
Anion gap: 15 (ref 5–15)
BUN: 8 mg/dL (ref 6–20)
CO2: 17 mmol/L — ABNORMAL LOW (ref 22–32)
Calcium: 9.1 mg/dL (ref 8.9–10.3)
Chloride: 105 mmol/L (ref 98–111)
Creatinine, Ser: 0.49 mg/dL (ref 0.44–1.00)
GFR, Estimated: >60 mL/min (ref >60)
Glucose, Bld: 195 mg/dL — ABNORMAL HIGH (ref 70–99)
Potassium: 3.6 mmol/L (ref 3.5–5.1)
Sodium: 137 mmol/L (ref 135–145)
Total Bilirubin: 0.5 mg/dL (ref 0.3–1.2)
Total Protein: 7.7 g/dL (ref 6.5–8.1)

## 2022-01-26 LAB — CBC
HCT: 46.4 % — ABNORMAL HIGH (ref 36.0–46.0)
Hemoglobin: 15.2 g/dL — ABNORMAL HIGH (ref 12.0–15.0)
MCH: 28.7 pg (ref 26.0–34.0)
MCHC: 32.8 g/dL (ref 30.0–36.0)
MCV: 87.7 fL (ref 80.0–100.0)
Platelets: 237 10*3/uL (ref 150–400)
RBC: 5.29 MIL/uL — ABNORMAL HIGH (ref 3.87–5.11)
RDW: 13.2 % (ref 11.5–15.5)
WBC: 6.4 10*3/uL (ref 4.0–10.5)
nRBC: 0 % (ref 0.0–0.2)

## 2022-01-26 LAB — URINALYSIS, ROUTINE W REFLEX MICROSCOPIC
Bilirubin Urine: NEGATIVE
Glucose, UA: >=500 mg/dL — AB
Ketones, ur: 80 mg/dL — AB
Nitrite: NEGATIVE
Protein, ur: >=300 mg/dL — AB
Specific Gravity, Urine: 1.028 (ref 1.005–1.030)
pH: 5 (ref 5.0–8.0)

## 2022-01-26 LAB — LIPASE, BLOOD: Lipase: 20 U/L (ref 11–51)

## 2022-01-26 LAB — GLUCOSE, CAPILLARY
Glucose-Capillary: 187 mg/dL — ABNORMAL HIGH (ref 70–99)
Glucose-Capillary: 194 mg/dL — ABNORMAL HIGH (ref 70–99)
Glucose-Capillary: 123 mg/dL — ABNORMAL HIGH (ref 70–99)

## 2022-01-26 MED ORDER — INSULIN ASPART 100 UNIT/ML IJ SOLN
8.0000 [IU] | Freq: Once | INTRAMUSCULAR | Status: AC
Start: 2022-01-26 — End: 2022-01-26
  Administered 2022-01-26: 8 [IU] via SUBCUTANEOUS

## 2022-01-26 MED ORDER — PROCHLORPERAZINE EDISYLATE 10 MG/2ML IJ SOLN
10.0000 mg | Freq: Four times a day (QID) | INTRAMUSCULAR | Status: DC | PRN
  Administered 2022-01-26: 10 mg via INTRAVENOUS
  Filled 2022-01-26 (×2): qty 2

## 2022-01-26 MED ORDER — LACTATED RINGERS IV BOLUS
1000.0000 mL | Freq: Once | INTRAVENOUS | Status: AC
  Administered 2022-01-26: 1000 mL via INTRAVENOUS

## 2022-01-26 MED ORDER — PROMETHAZINE HCL 25 MG RE SUPP: 25.0000 mg | Freq: Four times a day (QID) | RECTAL | 0 refills | Status: DC | PRN

## 2022-01-26 MED ORDER — ACETAMINOPHEN 500 MG PO TABS
1000.0000 mg | ORAL_TABLET | Freq: Four times a day (QID) | ORAL | Status: DC | PRN
  Administered 2022-01-26: 1000 mg via ORAL
  Filled 2022-01-26: qty 2

## 2022-01-26 MED ORDER — ONDANSETRON HCL 4 MG/2ML IJ SOLN
4.0000 mg | Freq: Once | INTRAMUSCULAR | Status: AC
  Administered 2022-01-26: 4 mg via INTRAVENOUS
  Filled 2022-01-26: qty 2

## 2022-01-26 MED ORDER — FAMOTIDINE IN NACL 20-0.9 MG/50ML-% IV SOLN
20.0000 mg | Freq: Once | INTRAVENOUS | Status: AC
Start: 2022-01-26 — End: 2022-01-26
  Administered 2022-01-26: 20 mg via INTRAVENOUS
  Filled 2022-01-26: qty 50

## 2022-01-26 MED ORDER — PROCHLORPERAZINE MALEATE 10 MG PO TABS: 10.0000 mg | ORAL_TABLET | Freq: Two times a day (BID) | ORAL | 2 refills | Status: DC | PRN

## 2022-01-26 NOTE — Discharge Instructions (Signed)

## 2022-01-26 NOTE — MAU Note (Signed)
Kristin Clay is a 36 y.o. at [redacted]w[redacted]d here in MAU reporting: N/V and back pain.  States took meds for N/V but meds were ineffective  ? ?Onset of complaint: 2 days ago ?Pain score: Back Pain 8 ?   ?FHT: 144 bpm ?Lab orders placed from triage: U/A    ? ?

## 2022-01-26 NOTE — MAU Provider Note (Addendum)
History     CSN: [id number]  Arrival date and time: 01/26/22 1442   Event Date/Time   First Provider Initiated Contact with Patient 01/26/22 1606      Chief Complaint  Patient presents with   Nausea   Emesis   36 y.o. [id number] '@18' .2 wks presenting with vomiting and back pain. Sx started 2 days ago. States she cannot tolerate anything po. She has taken Zofran today but it didn't help. No sick contacts. Denies diarrhea. Back pain is lower, central, and constant. Rates pain 8/10. Has not treated it. Denies urinary sx. Denies VB. Reports occasional abdominal pain. Hx of T1DM and reports fasting BS was 86 today. FBS was 200 yesterday. Last time she took Insulin was last night. Hx of CHTN and she took Procardia this am and did not vomit after.    OB History     Gravida  3   Para  1   Term  0   Preterm  1   AB  1   Living  1      SAB  1   IAB  0   Ectopic  0   Multiple  0   Live Births  1           Past Medical History:  Diagnosis Date   Chronic hypertension    Chronic hypertension with superimposed severe preeclampsia 15/83/0940   Complication of anesthesia    with hand surgery, local anes did not work, tried twice- had to put her to sleep   Diabetes mellitus type 1 ([place])    DKA (diabetic ketoacidosis) ([place]) 10/2020   Epidural hematoma 07/04/2021   GBS bacteriuria 06/09/2021   Obstetric vaginal laceration with type 3c third degree perineal laceration 06/29/2021   Poor fetal growth complicating pregnancy, antepartum, third trimester, other fetus 06/19/2021   Pyelonephritis 10/2020   Rh negative status during pregnancy 05/01/2021   s/p Rhogam 05/13/21   Type 1 diabetes mellitus with ketoacidosis, uncontrolled ([place]) 02/22/2020   Type I diabetes mellitus ([place])    dx 57yr ago   UTI (urinary tract infection)     Past Surgical History:  Procedure Laterality Date   female circumcision Bilateral    clitorectomy as well   FOOT SURGERY  2018   HAND SURGERY   2019    Family History  Problem Relation Age of Onset   Hyperlipidemia Mother    Hypertension Mother    Kidney disease Father    Alzheimer's disease Father     Social History   Tobacco Use   Smoking status: Never   Smokeless tobacco: Never  Vaping Use   Vaping Use: Never used  Substance Use Topics   Alcohol use: Never   Drug use: Never    Allergies:  Allergies  Allergen Reactions   Pork-Derived Products     Pt does not want any pork products    Medications Prior to Admission  Medication Sig Dispense Refill Last Dose   acetaminophen (TYLENOL) 500 MG tablet Take 1,000 mg by mouth every 6 (six) hours as needed.   01/25/2022   ASPIRIN LOW DOSE 81 MG chewable tablet CHEW 1 TABLET BY MOUTH DAILY. 90 tablet 1 01/26/2022   insulin aspart (NOVOLOG) 100 UNIT/ML FlexPen Inject 7 Units into the skin 3 (three) times daily with meals. 6 mL 11 01/25/2022   insulin glargine (LANTUS) 100 UNIT/ML Solostar Pen Inject 15 Units into the skin 2 (two) times daily. 9 mL 11 01/25/2022   labetalol (NORMODYNE) 100  MG tablet Take 100 mg by mouth 2 (two) times daily.   01/26/2022   metoCLOPramide (REGLAN) 10 MG tablet Take 1 tablet (10 mg total) by mouth 4 (four) times daily as needed for nausea or vomiting. 60 tablet 2 01/26/2022   NIFEdipine (ADALAT CC) 90 MG 24 hr tablet Take 90 mg by mouth daily.   01/26/2022   ondansetron (ZOFRAN-ODT) 4 MG disintegrating tablet Take 1 tablet (4 mg total) by mouth every 6 (six) hours as needed for nausea. 30 tablet 2 01/26/2022   Prenatal Vit-Fe Fumarate-FA (PRENATAL VITAMIN) 27-0.8 MG TABS Take 1 tablet by mouth daily. 90 tablet 2 01/26/2022   scopolamine (TRANSDERM-SCOP) 1 MG/3DAYS Place 1 patch (1.5 mg total) onto the skin every 3 (three) days. 10 patch 12 01/25/2022   Accu-Chek Softclix Lancets lancets Use as instructed; check blood glucose 4 times daily 200 each 10    Blood Glucose Monitoring Suppl (ACCU-CHEK GUIDE) w/Device KIT use as directed 4 (four) times daily. 1 kit 0     glucose blood (ACCU-CHEK GUIDE) test strip Use as instructed; check blood glucose 4 times daily 200 each 10    potassium chloride SA (KLOR-CON M) 20 MEQ tablet Take 2 tablets (40 mEq total) by mouth daily for 5 days. (Patient not taking: Reported on 01/15/2022) 10 tablet 0     Review of Systems  Constitutional:  Positive for chills. Negative for fever.  Gastrointestinal:  Positive for abdominal pain, nausea and vomiting. Negative for diarrhea.  Genitourinary:  Negative for dysuria, frequency, urgency, vaginal bleeding and vaginal discharge.  Musculoskeletal:  Positive for back pain.  Physical Exam   Blood pressure (!) 153/82, pulse 97, temperature 98 F (36.7 C), temperature source Oral, resp. rate 20, weight 50.8 kg, last menstrual period 09/20/2021, SpO2 100 %, unknown if currently breastfeeding.  Patient Vitals for the past 24 hrs:  BP Temp Temp src Pulse Resp SpO2 Weight  01/26/22 2119 (!) 142/74 -- -- 96 -- -- --  01/26/22 1605 (!) 153/82 -- -- 97 -- -- --  01/26/22 1542 (!) 155/94 98 F (36.7 C) Oral (!) 109 20 100 % --  01/26/22 1532 -- -- -- -- -- -- 50.8 kg     Physical Exam Vitals and nursing note reviewed.  Constitutional:      General: She is not in acute distress.    Appearance: Normal appearance.  HENT:     Head: Normocephalic and atraumatic.  Cardiovascular:     Rate and Rhythm: Normal rate.  Pulmonary:     Effort: Pulmonary effort is normal. No respiratory distress.  Abdominal:     Tenderness: There is no right CVA tenderness or left CVA tenderness.  Musculoskeletal:        General: Normal range of motion.     Cervical back: Normal and normal range of motion.     Thoracic back: Normal.     Lumbar back: Normal.  Skin:    General: Skin is warm and dry.  Neurological:     General: No focal deficit present.     Mental Status: She is alert and oriented to person, place, and time.  Psychiatric:        Mood and Affect: Mood normal.        Behavior: Behavior  normal.  FHT 144  Results for orders placed or performed during the hospital encounter of 01/26/22 (from the past 24 hour(s))  Urinalysis, Routine w reflex microscopic Urine, Clean Catch     Status: Abnormal  Collection Time: 01/26/22  3:35 PM  Result Value Ref Range   Color, Urine YELLOW YELLOW   APPearance CLOUDY (A) CLEAR   Specific Gravity, Urine 1.028 1.005 - 1.030   pH 5.0 5.0 - 8.0   Glucose, UA >=500 (A) NEGATIVE mg/dL   Hgb urine dipstick SMALL (A) NEGATIVE   Bilirubin Urine NEGATIVE NEGATIVE   Ketones, ur 80 (A) NEGATIVE mg/dL   Protein, ur >=300 (A) NEGATIVE mg/dL   Nitrite NEGATIVE NEGATIVE   Leukocytes,Ua MODERATE (A) NEGATIVE   RBC / HPF 11-20 0 - 5 RBC/hpf   WBC, UA 11-20 0 - 5 WBC/hpf   Bacteria, UA FEW (A) NONE SEEN   Squamous Epithelial / LPF 21-50 0 - 5   Mucus PRESENT   Glucose, capillary     Status: Abnormal   Collection Time: 01/26/22  4:13 PM  Result Value Ref Range   Glucose-Capillary 187 (H) 70 - 99 mg/dL  CBC     Status: Abnormal   Collection Time: 01/26/22  4:35 PM  Result Value Ref Range   WBC 6.4 4.0 - 10.5 K/uL   RBC 5.29 (H) 3.87 - 5.11 MIL/uL   Hemoglobin 15.2 (H) 12.0 - 15.0 g/dL   HCT 46.4 (H) 36.0 - 46.0 %   MCV 87.7 80.0 - 100.0 fL   MCH 28.7 26.0 - 34.0 pg   MCHC 32.8 30.0 - 36.0 g/dL   RDW 13.2 11.5 - 15.5 %   Platelets 237 150 - 400 K/uL   nRBC 0.0 0.0 - 0.2 %  Comprehensive metabolic panel     Status: Abnormal   Collection Time: 01/26/22  4:35 PM  Result Value Ref Range   Sodium 137 135 - 145 mmol/L   Potassium 3.6 3.5 - 5.1 mmol/L   Chloride 105 98 - 111 mmol/L   CO2 17 (L) 22 - 32 mmol/L   Glucose, Bld 195 (H) 70 - 99 mg/dL   BUN 8 6 - 20 mg/dL   Creatinine, Ser 0.49 0.44 - 1.00 mg/dL   Calcium 9.1 8.9 - 10.3 mg/dL   Total Protein 7.7 6.5 - 8.1 g/dL   Albumin 3.4 (L) 3.5 - 5.0 g/dL   AST 13 (L) 15 - 41 U/L   ALT 9 0 - 44 U/L   Alkaline Phosphatase 65 38 - 126 U/L   Total Bilirubin 0.5 0.3 - 1.2 mg/dL   GFR, Estimated  >60 >60 mL/min   Anion gap 15 5 - 15  Lipase, blood     Status: None   Collection Time: 01/26/22  4:35 PM  Result Value Ref Range   Lipase 20 11 - 51 U/L  Glucose, capillary     Status: Abnormal   Collection Time: 01/26/22  6:58 PM  Result Value Ref Range   Glucose-Capillary 194 (H) 70 - 99 mg/dL  Glucose, capillary     Status: Abnormal   Collection Time: 01/26/22  8:19 PM  Result Value Ref Range   Glucose-Capillary 123 (H) 70 - 99 mg/dL   MAU Course  Procedures LR Compazine  MDM Labs ordered and reviewed.  Consult with Dr. Nehemiah Settle, Insulin ordered.  2040: Pt reports more emesis after drinking water. Zofran and Tylenol ordered. CBG improved.  Transfer of care given to Burley Saver, [place]  01/26/2022 9:07 PM   Patient feeling better after zofran and able to keep down PO medication.   Assessment and Plan   1. Chronic hypertension affecting pregnancy   2. Nausea and vomiting  during pregnancy   3. Type 1 diabetes mellitus affecting pregnancy in second trimester, antepartum   4. [redacted] weeks gestation of pregnancy    -Discharge home in stable condition -Rx for antiemetics sent to patient's pharmacy -Nausea and vomiting precautions discussed -Patient advised to follow-up with OB as scheduled for prenatal care -Patient may return to MAU as needed or if her condition were to change or worsen  Wende Mott, CNM 01/26/22 11:04 PM

## 2022-01-27 ENCOUNTER — Other Ambulatory Visit: Payer: Self-pay | Admitting: Lactation Services

## 2022-01-27 ENCOUNTER — Ambulatory Visit (INDEPENDENT_AMBULATORY_CARE_PROVIDER_SITE_OTHER): Payer: Medicaid Other | Admitting: Registered"

## 2022-01-27 DIAGNOSIS — O24012 Pre-existing diabetes mellitus, type 1, in pregnancy, second trimester: Secondary | ICD-10-CM

## 2022-01-27 MED ORDER — DEXCOM G6 TRANSMITTER MISC: 1.0000 | 1 refills | Status: DC

## 2022-01-27 MED ORDER — DEXCOM G6 SENSOR MISC: 1.0000 | 6 refills | Status: AC

## 2022-01-27 MED ORDER — DEXCOM G6 RECEIVER DEVI: 1.0000 | Freq: Once | 0 refills | Status: AC

## 2022-01-27 NOTE — Progress Notes (Signed)
Copywriter, advertising services provided by [employer] from [employer] ? ?Patient with T1D in pregnancy; EDD 06/27/22; [redacted]w[redacted]d. was seen on 01/27/22 for education on Dexcom continuous glucose monitor ? ?Patient had downloaded Dexcom G6 and Clarity apps. Tried to set up Dexcom account using patient's gmail account but the verification email did not come through. Made several attempts, restarted phone and re-entered information without success. ? ?While waiting for email verification discussed application of sensor. We will send Rx for Dexcom G6 including receiver and have patient return after pharmacy fills Rx. ? ?Patient was seen by RN after diabetes visit d/t patient c/o vomiting and pain all over her body. Pt was discharged from hospital yesterday.  ? ?The following learning objectives reviewed during follow-up visit: ? ?Appropriate places to place CGM sensors, how to use the sensor applicator, 10-day changing of sensor. ? ?Plan:  ?Return for visit to apply CGM after Rx is filled. ? ? ?Patient instructed to monitor glucose levels: ?FBS: 70 - 95 mg/dl ?2 hour: <120 mg/dl ? ?Patient received the following handouts: ?none ? ?Patient will be seen for follow-up after supplies received or as needed.  ?

## 2022-01-27 NOTE — Progress Notes (Signed)
Ordered Dexcom G6 and supplies per Heywood Bene, RD ?

## 2022-01-28 ENCOUNTER — Encounter (HOSPITAL_COMMUNITY): Payer: Self-pay | Admitting: Obstetrics and Gynecology

## 2022-01-28 ENCOUNTER — Inpatient Hospital Stay (HOSPITAL_COMMUNITY): Payer: Medicaid Other

## 2022-01-28 ENCOUNTER — Inpatient Hospital Stay (HOSPITAL_COMMUNITY)
Admission: AD | Admit: 2022-01-28 | Discharge: 2022-01-28 | Disposition: A | Discharge status: Home / Self Care | Payer: Medicaid Other | Attending: Obstetrics and Gynecology | Admitting: Obstetrics and Gynecology

## 2022-01-28 ENCOUNTER — Other Ambulatory Visit: Payer: Self-pay

## 2022-01-28 DIAGNOSIS — Z6791 Unspecified blood type, Rh negative: Secondary | ICD-10-CM

## 2022-01-28 DIAGNOSIS — O99282 Endocrine, nutritional and metabolic diseases complicating pregnancy, second trimester: Secondary | ICD-10-CM | POA: Insufficient documentation

## 2022-01-28 DIAGNOSIS — O1212 Gestational proteinuria, second trimester: Secondary | ICD-10-CM

## 2022-01-28 DIAGNOSIS — R1013 Epigastric pain: Secondary | ICD-10-CM | POA: Insufficient documentation

## 2022-01-28 DIAGNOSIS — O26892 Other specified pregnancy related conditions, second trimester: Secondary | ICD-10-CM | POA: Insufficient documentation

## 2022-01-28 DIAGNOSIS — M545 Low back pain, unspecified: Secondary | ICD-10-CM | POA: Insufficient documentation

## 2022-01-28 DIAGNOSIS — Z7982 Long term (current) use of aspirin: Secondary | ICD-10-CM | POA: Insufficient documentation

## 2022-01-28 DIAGNOSIS — O99891 Other specified diseases and conditions complicating pregnancy: Secondary | ICD-10-CM

## 2022-01-28 DIAGNOSIS — O0990 Supervision of high risk pregnancy, unspecified, unspecified trimester: Secondary | ICD-10-CM

## 2022-01-28 DIAGNOSIS — E876 Hypokalemia: Secondary | ICD-10-CM | POA: Insufficient documentation

## 2022-01-28 DIAGNOSIS — O10012 Pre-existing essential hypertension complicating pregnancy, second trimester: Secondary | ICD-10-CM | POA: Insufficient documentation

## 2022-01-28 DIAGNOSIS — O210 Mild hyperemesis gravidarum: Secondary | ICD-10-CM | POA: Insufficient documentation

## 2022-01-28 DIAGNOSIS — O26899 Other specified pregnancy related conditions, unspecified trimester: Secondary | ICD-10-CM

## 2022-01-28 DIAGNOSIS — Z3A18 18 weeks gestation of pregnancy: Secondary | ICD-10-CM | POA: Insufficient documentation

## 2022-01-28 DIAGNOSIS — O24012 Pre-existing diabetes mellitus, type 1, in pregnancy, second trimester: Secondary | ICD-10-CM | POA: Insufficient documentation

## 2022-01-28 DIAGNOSIS — O10919 Unspecified pre-existing hypertension complicating pregnancy, unspecified trimester: Secondary | ICD-10-CM

## 2022-01-28 DIAGNOSIS — Z794 Long term (current) use of insulin: Secondary | ICD-10-CM | POA: Insufficient documentation

## 2022-01-28 DIAGNOSIS — O09299 Supervision of pregnancy with other poor reproductive or obstetric history, unspecified trimester: Secondary | ICD-10-CM

## 2022-01-28 DIAGNOSIS — R109 Unspecified abdominal pain: Secondary | ICD-10-CM

## 2022-01-28 DIAGNOSIS — O219 Vomiting of pregnancy, unspecified: Secondary | ICD-10-CM

## 2022-01-28 DIAGNOSIS — M549 Dorsalgia, unspecified: Secondary | ICD-10-CM

## 2022-01-28 LAB — COMPREHENSIVE METABOLIC PANEL
ALT: 9 U/L (ref 0–44)
AST: 10 U/L — ABNORMAL LOW (ref 15–41)
Albumin: 3.3 g/dL — ABNORMAL LOW (ref 3.5–5.0)
Alkaline Phosphatase: 56 U/L (ref 38–126)
Anion gap: 11 (ref 5–15)
BUN: 6 mg/dL (ref 6–20)
CO2: 25 mmol/L (ref 22–32)
Calcium: 8.9 mg/dL (ref 8.9–10.3)
Chloride: 105 mmol/L (ref 98–111)
Creatinine, Ser: 0.42 mg/dL — ABNORMAL LOW (ref 0.44–1.00)
GFR, Estimated: >60 mL/min (ref >60)
Glucose, Bld: 77 mg/dL (ref 70–99)
Potassium: 2.8 mmol/L — ABNORMAL LOW (ref 3.5–5.1)
Sodium: 141 mmol/L (ref 135–145)
Total Bilirubin: 0.6 mg/dL (ref 0.3–1.2)
Total Protein: 7 g/dL (ref 6.5–8.1)

## 2022-01-28 LAB — CBC WITH DIFFERENTIAL/PLATELET
Abs Immature Granulocytes: 0.02 10*3/uL (ref 0.00–0.07)
Basophils Absolute: 0 10*3/uL (ref 0.0–0.1)
Basophils Relative: 0 %
Eosinophils Absolute: 0 10*3/uL (ref 0.0–0.5)
Eosinophils Relative: 0 %
HCT: 43.1 % (ref 36.0–46.0)
Hemoglobin: 14.4 g/dL (ref 12.0–15.0)
Immature Granulocytes: 0 %
Lymphocytes Relative: 26 %
Lymphs Abs: 1.5 10*3/uL (ref 0.7–4.0)
MCH: 28.2 pg (ref 26.0–34.0)
MCHC: 33.4 g/dL (ref 30.0–36.0)
MCV: 84.3 fL (ref 80.0–100.0)
Monocytes Absolute: 0.6 10*3/uL (ref 0.1–1.0)
Monocytes Relative: 10 %
Neutro Abs: 3.7 10*3/uL (ref 1.7–7.7)
Neutrophils Relative %: 64 %
Platelets: 277 10*3/uL (ref 150–400)
RBC: 5.11 MIL/uL (ref 3.87–5.11)
RDW: 12.8 % (ref 11.5–15.5)
WBC: 5.8 10*3/uL (ref 4.0–10.5)
nRBC: 0.3 % — ABNORMAL HIGH (ref 0.0–0.2)

## 2022-01-28 LAB — URINALYSIS, ROUTINE W REFLEX MICROSCOPIC
Bilirubin Urine: NEGATIVE
Glucose, UA: >=500 mg/dL — AB
Ketones, ur: 80 mg/dL — AB
Nitrite: NEGATIVE
Protein, ur: 100 mg/dL — AB
Specific Gravity, Urine: 1.03 (ref 1.005–1.030)
pH: 6 (ref 5.0–8.0)

## 2022-01-28 LAB — LIPASE, BLOOD: Lipase: 24 U/L (ref 11–51)

## 2022-01-28 LAB — CULTURE, OB URINE: Culture: >=100000 — AB

## 2022-01-28 LAB — GLUCOSE, CAPILLARY: Glucose-Capillary: 79 mg/dL (ref 70–99)

## 2022-01-28 IMAGING — US US ABDOMEN LIMITED
1 series · 15 of 25 positions shown · non-contrast
Comparison: None.

CLINICAL DATA: Epigastric pain.  Pregnant.

EXAM:
ULTRASOUND ABDOMEN LIMITED RIGHT UPPER QUADRANT

[Series 1: us abdomen limited · 15 of 56 slices shown]
[im 1/56]
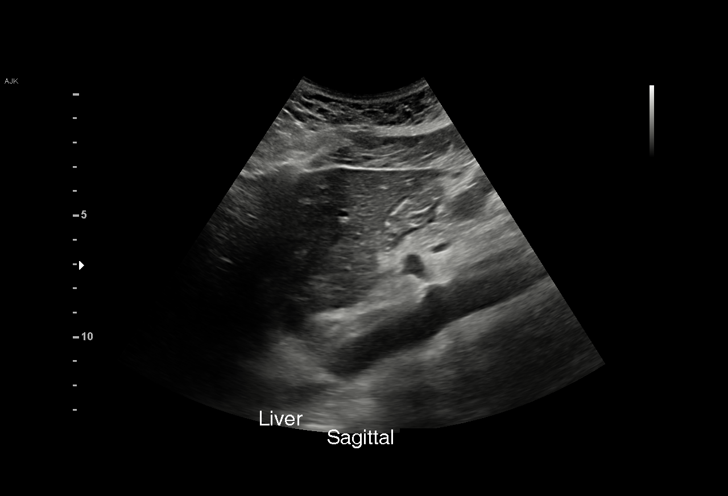
[im 5/56]
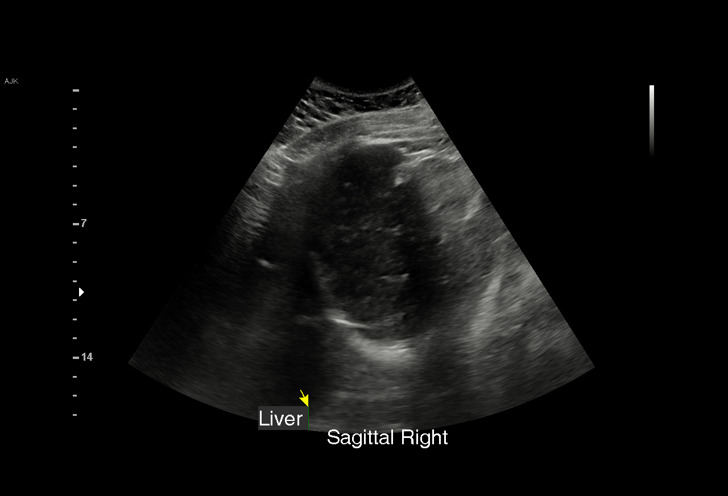
[im 10/56]
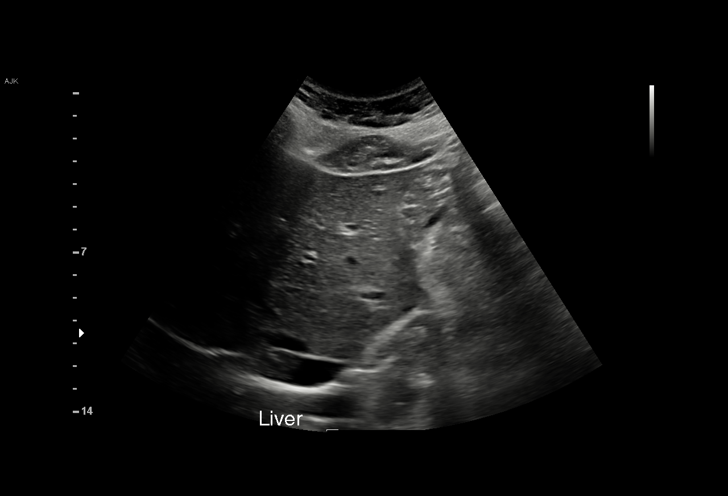
[im 12/56]
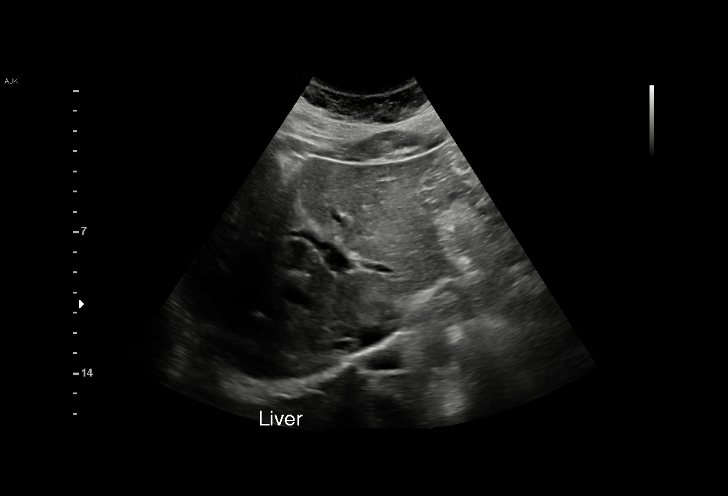
[im 17/56]
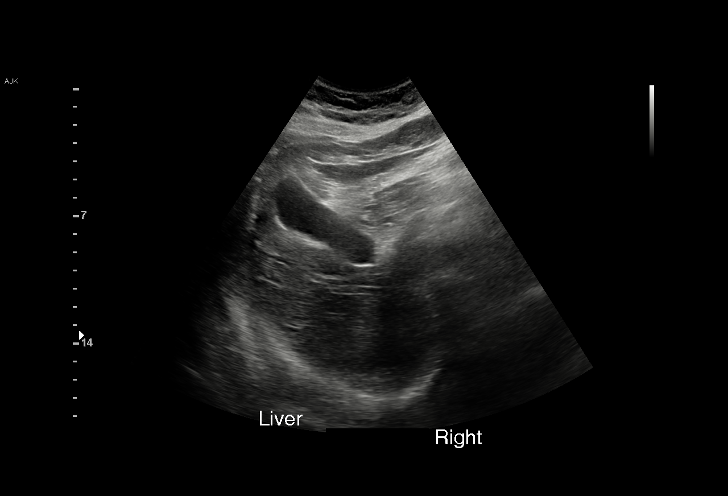
[im 21/56]
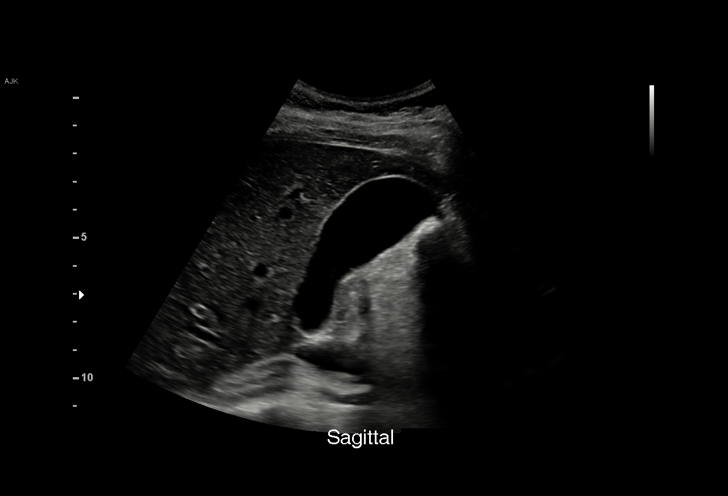
[im 23/56]
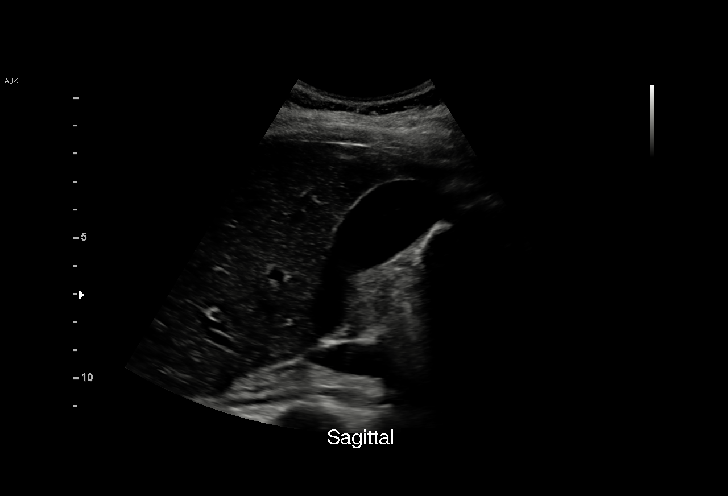
[im 28/56]
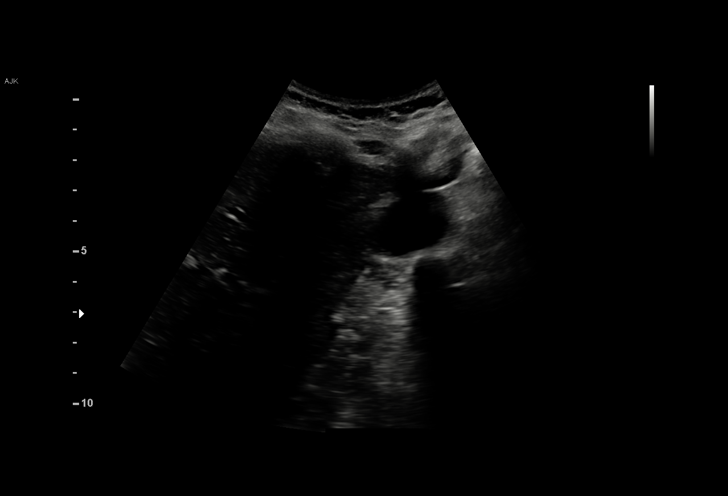
[im 33/56]
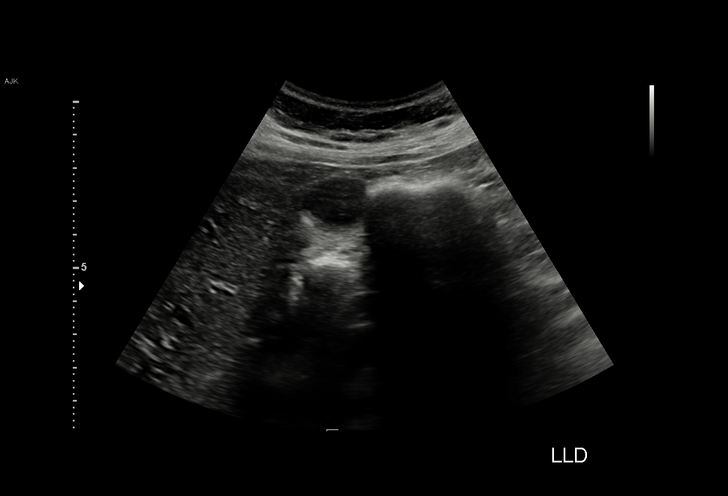
[im 35/56]
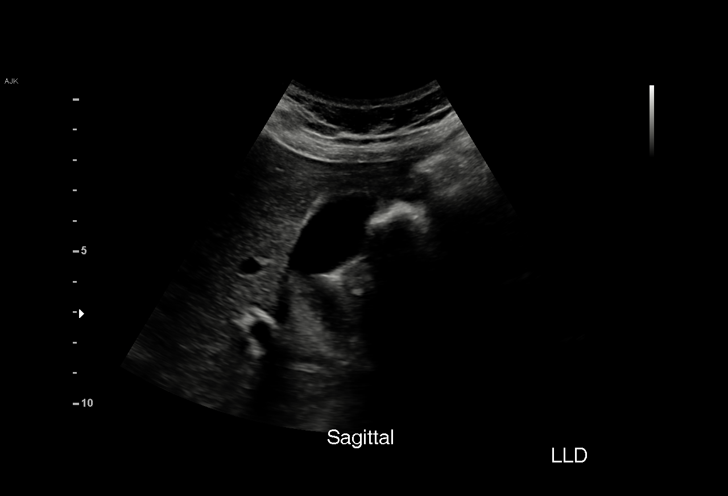
[im 39/56]
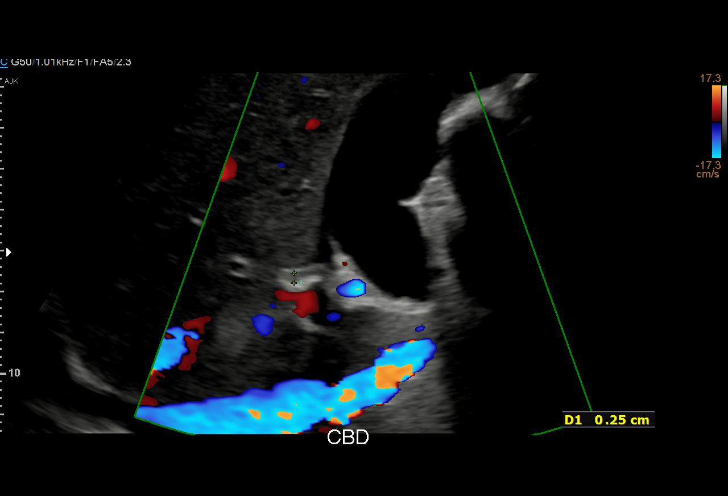
[im 44/56]
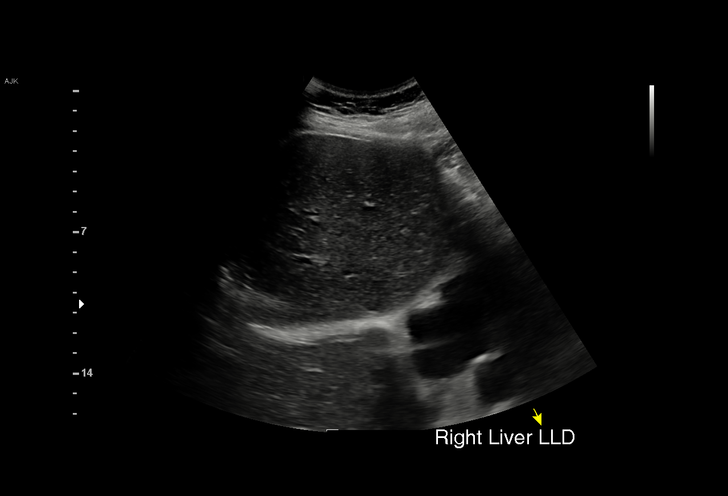
[im 46/56]
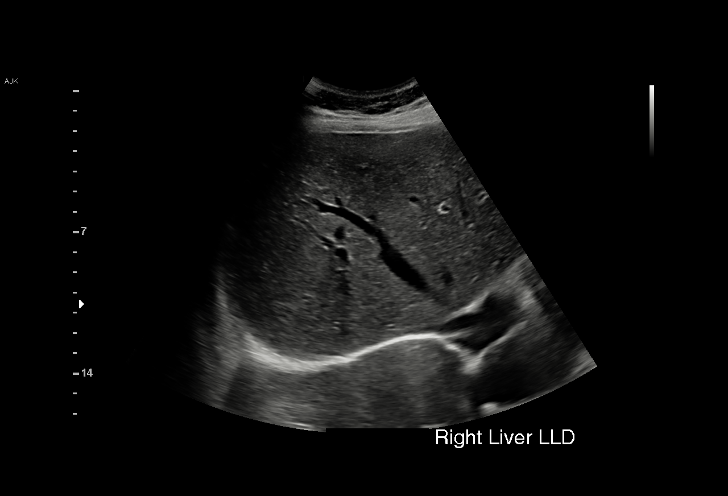
[im 51/56]
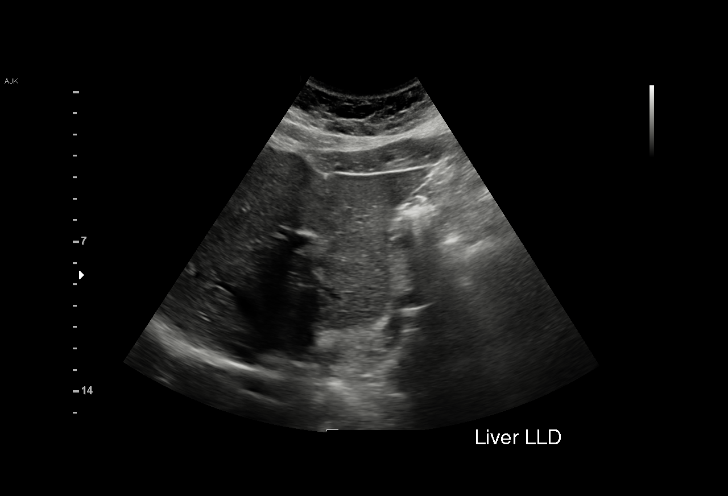
[im 56/56]
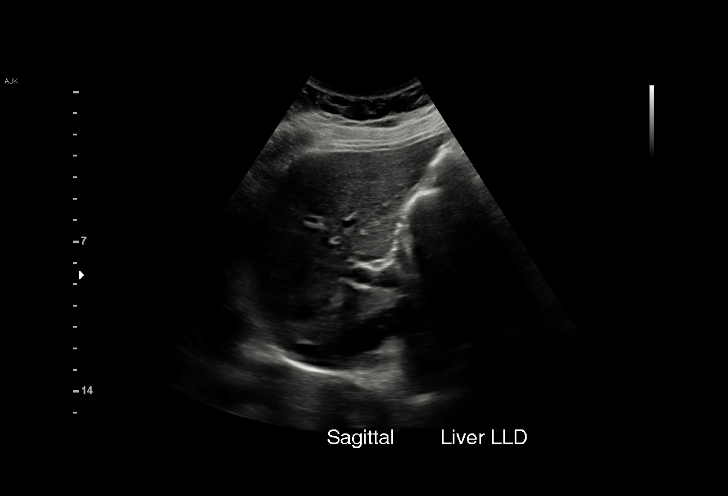

[15 of 25 positions shown; findings below may reference images not displayed]

FINDINGS: Gallbladder:

No gallstones or wall thickening visualized. No sonographic Murphy
sign noted by sonographer.

Common bile duct:

Diameter: Bile duct 2.5 mm.

Liver:

No focal lesion identified. Within normal limits in parenchymal
echogenicity. Portal vein is patent on color Doppler imaging with
normal direction of blood flow towards the liver.

Other: None.
IMPRESSION: Normal right upper quadrant ultrasound.

## 2022-01-28 MED ORDER — FAMOTIDINE IN NACL 20-0.9 MG/50ML-% IV SOLN
20.0000 mg | Freq: Once | INTRAVENOUS | Status: AC
  Administered 2022-01-28: 20 mg via INTRAVENOUS
  Filled 2022-01-28: qty 50

## 2022-01-28 MED ORDER — POTASSIUM CHLORIDE CRYS ER 20 MEQ PO TBCR: 40.0000 meq | EXTENDED_RELEASE_TABLET | Freq: Every day | ORAL | 0 refills | Status: DC

## 2022-01-28 MED ORDER — LACTATED RINGERS IV BOLUS
1000.0000 mL | Freq: Once | INTRAVENOUS | Status: AC
  Administered 2022-01-28: 1000 mL via INTRAVENOUS

## 2022-01-28 MED ORDER — ONDANSETRON HCL 4 MG/2ML IJ SOLN
4.0000 mg | Freq: Once | INTRAMUSCULAR | Status: AC
  Administered 2022-01-28: 4 mg via INTRAVENOUS
  Filled 2022-01-28: qty 2

## 2022-01-28 MED ORDER — CALCIUM CARBONATE ANTACID 500 MG PO CHEW: 1.0000 | CHEWABLE_TABLET | Freq: Two times a day (BID) | ORAL | 0 refills | Status: DC | PRN

## 2022-01-28 MED ORDER — CALCIUM CARBONATE ANTACID 500 MG PO CHEW
1.0000 | CHEWABLE_TABLET | Freq: Once | ORAL | Status: AC
  Administered 2022-01-28: 200 mg via ORAL
  Filled 2022-01-28: qty 1

## 2022-01-28 MED ORDER — POTASSIUM CHLORIDE 10 MEQ/100ML IV SOLN
10.0000 meq | INTRAVENOUS | Status: AC
  Administered 2022-01-28 (×2): 10 meq via INTRAVENOUS
  Filled 2022-01-28 (×2): qty 100

## 2022-01-28 MED ORDER — ACETAMINOPHEN 500 MG PO TABS
1000.0000 mg | ORAL_TABLET | Freq: Once | ORAL | Status: AC
  Administered 2022-01-28: 1000 mg via ORAL
  Filled 2022-01-28: qty 2

## 2022-01-28 NOTE — MAU Provider Note (Signed)
MAU Provider Note   History     CSN: [id number]  Arrival date and time: 01/28/22 1504   Event Date/Time   First Provider Initiated Contact with Patient 01/28/22 1616      Chief Complaint  Patient presents with   Abdominal Pain   Vomiting   Kristin Clay is a 36 year old [name] female presenting for evaluation of abdominal pain, upper/lower back pain, and recurrent emesis.  She reports her symptoms started approximately 5 days ago.  Was seen in the MAU on 3/6 and received IV fluids and nausea medication which made her feel better, however upon returning home symptoms increased.  She overall feels that things have been worse since onset.  She reports difficulty with nausea and vomiting for the entirety of this pregnancy.  Abdominal pain: States it is "all over" the worst in the epigastrium/lower abdomen. describes it as a "moving" sensation.  Intermittent.  Not aware of any exacerbating factors.  Nothing is made it better.  Associated with nausea/vomiting and anorexia.  Denies any fever or sick contacts.  Last BM was 2 days ago and solid.    Back pain: Present in upper and lower back.  Constant.  Nothing has made it better and not aware of anything that seems to make it worse.  No trauma or falls.  Denies any associated extremity weakness/numbness, no urinary/bowel incontinence.  Vomiting: Reports she has vomited approximately 6 times today.  She tried a piece of toast and water earlier today but vomited shortly afterwards.  Earlier this morning it was red/brown, the most recent was clear. + Heartburn.  She has Zofran and scopolamine patch at home but does not feel like they are helping.  Had Phenergan and Compazine sent to the pharmacy on 3/6 but she has not picked it up yet.  She has known type 1 diabetes and chronic hypertension.  States she took her Lantus 20 units and Humalog 12 units this morning.  Fasting CBG was 200 but 2 hours after it was 101.  Reports overall compliance to her insulin,  states she has missed a few doses due to not eating anything/vomiting over the past several days.  Took her Procardia and labetalol this morning.  An Arabic interpreter via iPad was used for the duration of evaluation.  Past Medical History:  Diagnosis Date   Chronic hypertension    Chronic hypertension with superimposed severe preeclampsia 50/01/7047   Complication of anesthesia    with hand surgery, local anes did not work, tried twice- had to put her to sleep   Diabetes mellitus type 1 ([place])    DKA (diabetic ketoacidosis) ([place]) 10/2020   Epidural hematoma 07/04/2021   GBS bacteriuria 06/09/2021   Obstetric vaginal laceration with type 3c third degree perineal laceration 06/29/2021   Poor fetal growth complicating pregnancy, antepartum, third trimester, other fetus 06/19/2021   Pyelonephritis 10/2020   Rh negative status during pregnancy 05/01/2021   s/p Rhogam 05/13/21   Type 1 diabetes mellitus with ketoacidosis, uncontrolled ([place]) 02/22/2020   Type I diabetes mellitus ([place])    dx 28yr ago   UTI (urinary tract infection)     Past Surgical History:  Procedure Laterality Date   female circumcision Bilateral    clitorectomy as well   FOOT SURGERY  2018   HAND SURGERY  2019    Family History  Problem Relation Age of Onset   Hyperlipidemia Mother    Hypertension Mother    Kidney disease Father    Alzheimer's disease  Father     Social History   Tobacco Use   Smoking status: Never   Smokeless tobacco: Never  Vaping Use   Vaping Use: Never used  Substance Use Topics   Alcohol use: Never   Drug use: Never    Allergies:  Allergies  Allergen Reactions   Pork-Derived Products     Pt does not want any pork products    Medications Prior to Admission  Medication Sig Dispense Refill Last Dose   Accu-Chek Softclix Lancets lancets Use as instructed; check blood glucose 4 times daily 200 each 10 01/28/2022   acetaminophen (TYLENOL) 500 MG tablet Take 1,000 mg by mouth  every 6 (six) hours as needed.   01/28/2022   ASPIRIN LOW DOSE 81 MG chewable tablet CHEW 1 TABLET BY MOUTH DAILY. 90 tablet 1 01/28/2022   Blood Glucose Monitoring Suppl (ACCU-CHEK GUIDE) w/Device KIT use as directed 4 (four) times daily. 1 kit 0 01/28/2022   glucose blood (ACCU-CHEK GUIDE) test strip Use as instructed; check blood glucose 4 times daily 200 each 10 01/28/2022   insulin aspart (NOVOLOG) 100 UNIT/ML FlexPen Inject 7 Units into the skin 3 (three) times daily with meals. 6 mL 11 01/28/2022   insulin glargine (LANTUS) 100 UNIT/ML Solostar Pen Inject 15 Units into the skin 2 (two) times daily. 9 mL 11 01/28/2022   labetalol (NORMODYNE) 100 MG tablet Take 100 mg by mouth 2 (two) times daily.   01/28/2022   NIFEdipine (ADALAT CC) 90 MG 24 hr tablet Take 90 mg by mouth daily.   01/28/2022   ondansetron (ZOFRAN-ODT) 4 MG disintegrating tablet Take 1 tablet (4 mg total) by mouth every 6 (six) hours as needed for nausea. 30 tablet 2 01/28/2022   Prenatal Vit-Fe Fumarate-FA (PRENATAL VITAMIN) 27-0.8 MG TABS Take 1 tablet by mouth daily. 90 tablet 2 01/28/2022   prochlorperazine (COMPAZINE) 10 MG tablet Take 1 tablet (10 mg total) by mouth 2 (two) times daily as needed for nausea or vomiting. 30 tablet 2 01/28/2022   promethazine (PHENERGAN) 25 MG suppository Place 1 suppository (25 mg total) rectally every 6 (six) hours as needed for nausea or vomiting. 12 each 0 01/27/2022   scopolamine (TRANSDERM-SCOP) 1 MG/3DAYS Place 1 patch (1.5 mg total) onto the skin every 3 (three) days. 10 patch 12 Past Week   Continuous Blood Gluc Sensor (DEXCOM G6 SENSOR) MISC 1 Device by Does not apply route as directed for 10 days. 3 each 6 Unknown   Continuous Blood Gluc Transmit (DEXCOM G6 TRANSMITTER) MISC 1 Device by Does not apply route every 3 (three) months. 3 each 1 Unknown   metoCLOPramide (REGLAN) 10 MG tablet Take 1 tablet (10 mg total) by mouth 4 (four) times daily as needed for nausea or vomiting. 60 tablet 2    potassium  chloride SA (KLOR-CON M) 20 MEQ tablet Take 2 tablets (40 mEq total) by mouth daily for 5 days. (Patient not taking: Reported on 01/15/2022) 10 tablet 0     Review of Systems  Constitutional:  Positive for appetite change. Negative for fatigue and fever.  HENT:  Negative for congestion.   Respiratory:  Negative for shortness of breath.   Cardiovascular:  Negative for chest pain.  Gastrointestinal:  Positive for abdominal pain, nausea and vomiting. Negative for abdominal distention, constipation and diarrhea.  Genitourinary:  Negative for dysuria, vaginal bleeding and vaginal discharge.  Musculoskeletal:  Positive for back pain. Negative for gait problem.  Skin:  Negative for pallor and rash.  Neurological:  Negative for dizziness, syncope, weakness, light-headedness and numbness.  Psychiatric/Behavioral:  Negative for behavioral problems.   Physical Exam   Blood pressure (!) 144/91, pulse (!) 101, temperature 98.2 F (36.8 C), temperature source Oral, resp. rate 16, weight 50.8 kg, last menstrual period 09/20/2021, SpO2 100 %, unknown if currently breastfeeding.  Physical Exam Constitutional:      Appearance: She is well-developed. She is not diaphoretic.     Comments: Appears tired but nontoxic.  No acute distress.  HENT:     Head: Normocephalic and atraumatic.     Mouth/Throat:     Comments: Appears dry Eyes:     Extraocular Movements: Extraocular movements intact.  Cardiovascular:     Pulses: Normal pulses.  Pulmonary:     Effort: Pulmonary effort is normal.  Abdominal:     Tenderness: There is no right CVA tenderness or left CVA tenderness.     Comments: Soft, nondistended.  Gravid appropriate for gestational age.  Localized tenderness to epigastrium, LUQ, and generally across lower abdomen to left/right quadrants.  No rebounding or guarding, but winces when pressed. (See abdominal reassessments below)   Musculoskeletal:     Comments: Tender to light palpation of bilateral  trapezius musculature and along the paraspinal musculature of the lumbar/lower thoracic spine.  No ecchymoses, erythema, rash, or swelling noted on her back.  Skin:    General: Skin is warm.  Neurological:     Mental Status: She is alert.  Psychiatric:        Behavior: Behavior normal.    MAU Course   MDM UA with 80 ketones  CBC, CMP, Lipase  IV LR bolus, IV zofran, IV pepcid and reassess   Reassessed 1815: S/p Zofran and some of her IV fluids.  Reports that she is feeling a little bit better.  Repalpated abdomen, still localized tenderness in epigastrium and left lower quadrant, nontender to palpation in McBurney's point/RLQ.  Negative Murphy sign.  Depending on how pressure is placed, the location within the LLQ of tenderness seems to change.  Potassium on labs 2.8, will give IV runs and oral when able to tolerate.  Reassessed 1940: Just returned from [place]. Lower abdominal pain has greatly improved, still having some epigastric discomfort. No emesis since arrival and now tolerated sips of water. Will give tums and tylenol.   Reassessed 2020: Ultrasound normal. Tums helped a lot. Only thing bothering her now is her shoulder pain. No longer tender on abdomen. Feeling close to her baseline. Ready to go home.   Assessment and Plan   1. Nausea and vomiting during pregnancy 2. Abdominal pain affecting pregnancy Resolved with IVF, Zofran, Pepcid, and Tums.  Tolerated oral liquids/pills. Considered DKA in a known T1DM, however CBG on arrival 79, CO2 25, and AG of 11 highly suggestive against this.  RUQ [place] negative.  Lipase/LFTs WNL.  Benign abdomen on recheck.  Instructed to pick up Compazine/Phenergan already ready at the pharmacy. Rx'd Tums as well (pt stated this was the most helpful).   3. [redacted] weeks gestation of pregnancy +FHT.  4. Back pain affecting pregnancy in second trimester Most consistent with MSK.  Shown upper neck/shoulder stretches to do at home.  Encouraged warm compresses and  massage.  5. Hypokalemia  K 2.8, in setting of emesis.  Given IV X2 runs and then prescribed kdur to take with nausea medication at home.   Discharged home in stable condition.  Strict MAU return precautions especially if symptoms return despite antiemetics/therapies at home.  Re-stressed the importance of taking insulin and hypertensive medicines at home.   Patriciaann Clan 01/28/2022, 4:16 PM

## 2022-01-28 NOTE — MAU Note (Signed)
Pt reports to MAU with c/o severe vomiting and stomach pain.  Pt states that she took Zofran and the other medicine from [facility] around 0830 today.  Pt denies LOF and VB.   ?

## 2022-01-29 ENCOUNTER — Telehealth: Payer: Self-pay

## 2022-01-29 NOTE — Telephone Encounter (Signed)
?  [employer] ([employer] [id number]) - [id number] ?Dexcom G6 Receiver device ?    ?Status: PA Response - Approved ? ?Created: March 7th, 2023 [phone] ? ?Sent: March 8th, 2023 ? ?Open  Archive ? ?[id number] ([employer]) [phone] ?Dexcom G6 Sensor ?    ?Status: PA Response - Approved ? ?Created: March 7th, 2023 [phone] ? ?Sent: March 8th, 2023 ? ?Open  Archive+ ? ? ?PA approved as above.  ?Pharmacy notified.  ?Judeth Cornfield, RN  ?

## 2022-02-02 ENCOUNTER — Ambulatory Visit: Payer: Medicaid Other | Admitting: *Deleted

## 2022-02-02 ENCOUNTER — Ambulatory Visit: Payer: Medicaid Other | Attending: Obstetrics & Gynecology

## 2022-02-02 ENCOUNTER — Other Ambulatory Visit: Payer: Self-pay

## 2022-02-02 ENCOUNTER — Ambulatory Visit (HOSPITAL_BASED_OUTPATIENT_CLINIC_OR_DEPARTMENT_OTHER): Payer: Medicaid Other | Admitting: Obstetrics and Gynecology

## 2022-02-02 VITALS — BP 154/82 | HR 72

## 2022-02-02 DIAGNOSIS — O10912 Unspecified pre-existing hypertension complicating pregnancy, second trimester: Secondary | ICD-10-CM | POA: Insufficient documentation

## 2022-02-02 DIAGNOSIS — O26899 Other specified pregnancy related conditions, unspecified trimester: Secondary | ICD-10-CM | POA: Insufficient documentation

## 2022-02-02 DIAGNOSIS — R0789 Other chest pain: Secondary | ICD-10-CM | POA: Insufficient documentation

## 2022-02-02 DIAGNOSIS — O09299 Supervision of pregnancy with other poor reproductive or obstetric history, unspecified trimester: Secondary | ICD-10-CM | POA: Insufficient documentation

## 2022-02-02 DIAGNOSIS — O09522 Supervision of elderly multigravida, second trimester: Secondary | ICD-10-CM

## 2022-02-02 DIAGNOSIS — O24112 Pre-existing diabetes mellitus, type 2, in pregnancy, second trimester: Secondary | ICD-10-CM | POA: Insufficient documentation

## 2022-02-02 DIAGNOSIS — O10919 Unspecified pre-existing hypertension complicating pregnancy, unspecified trimester: Secondary | ICD-10-CM

## 2022-02-02 DIAGNOSIS — O26892 Other specified pregnancy related conditions, second trimester: Secondary | ICD-10-CM | POA: Insufficient documentation

## 2022-02-02 DIAGNOSIS — Z6791 Unspecified blood type, Rh negative: Secondary | ICD-10-CM

## 2022-02-02 DIAGNOSIS — O10012 Pre-existing essential hypertension complicating pregnancy, second trimester: Secondary | ICD-10-CM

## 2022-02-02 DIAGNOSIS — O1212 Gestational proteinuria, second trimester: Secondary | ICD-10-CM | POA: Diagnosis present

## 2022-02-02 DIAGNOSIS — Z3A19 19 weeks gestation of pregnancy: Secondary | ICD-10-CM | POA: Insufficient documentation

## 2022-02-02 DIAGNOSIS — I3139 Other pericardial effusion (noninflammatory): Secondary | ICD-10-CM | POA: Insufficient documentation

## 2022-02-02 DIAGNOSIS — O24012 Pre-existing diabetes mellitus, type 1, in pregnancy, second trimester: Secondary | ICD-10-CM | POA: Diagnosis not present

## 2022-02-02 DIAGNOSIS — E118 Type 2 diabetes mellitus with unspecified complications: Secondary | ICD-10-CM | POA: Insufficient documentation

## 2022-02-02 DIAGNOSIS — E10649 Type 1 diabetes mellitus with hypoglycemia without coma: Secondary | ICD-10-CM | POA: Diagnosis not present

## 2022-02-02 DIAGNOSIS — O0990 Supervision of high risk pregnancy, unspecified, unspecified trimester: Secondary | ICD-10-CM | POA: Diagnosis not present

## 2022-02-02 DIAGNOSIS — Z3A18 18 weeks gestation of pregnancy: Secondary | ICD-10-CM

## 2022-02-02 IMAGING — US US MFM OB DETAIL+14 WK
1 series · 14 of 28 positions shown · non-contrast
Comparison: none

[Series 1: us mfm ob detail+14 wk · 14 of 112 slices shown]
[im 5/112]
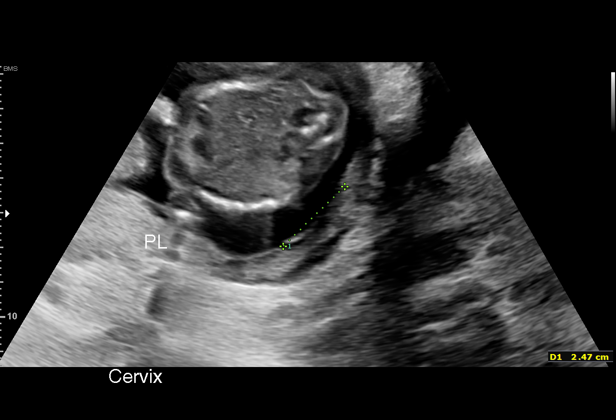
[im 13/112]
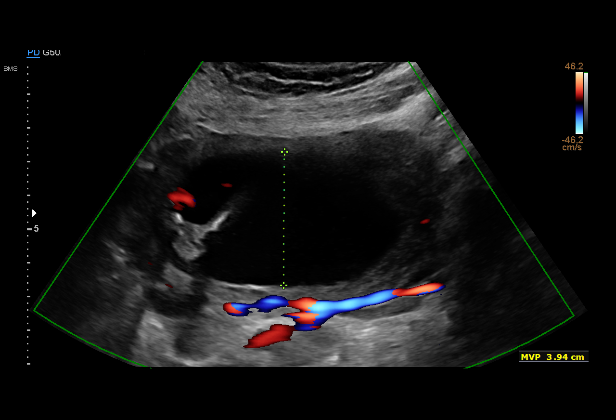
[im 21/112]
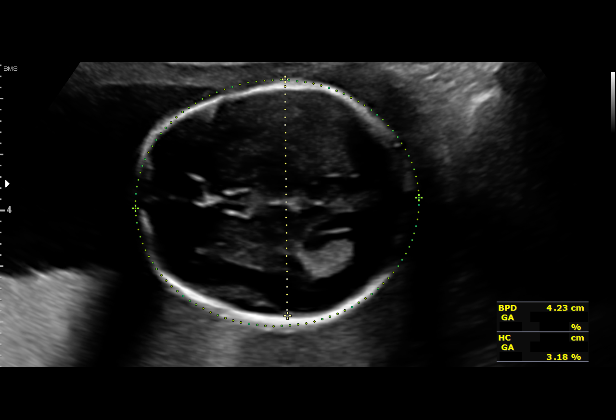
[im 29/112]
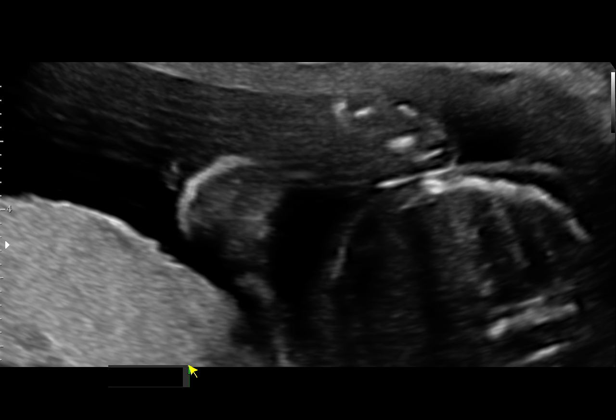
[im 38/112]
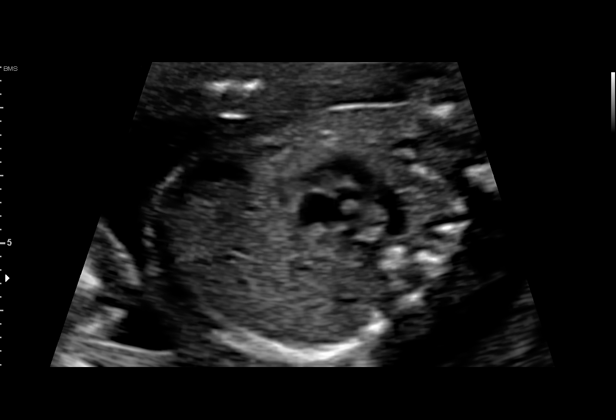
[im 46/112]
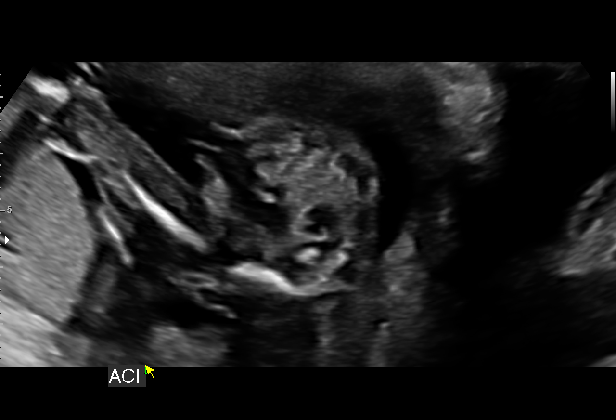
[im 54/112]
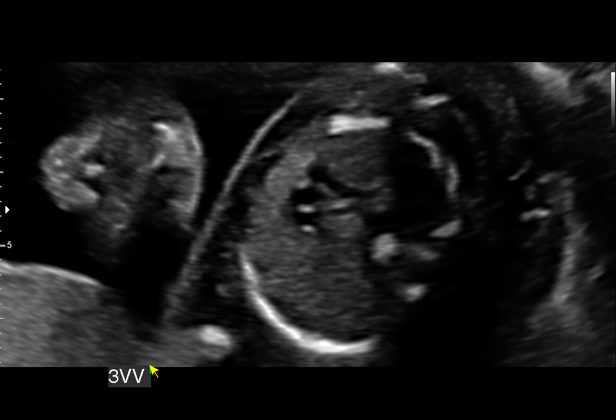
[im 62/112]
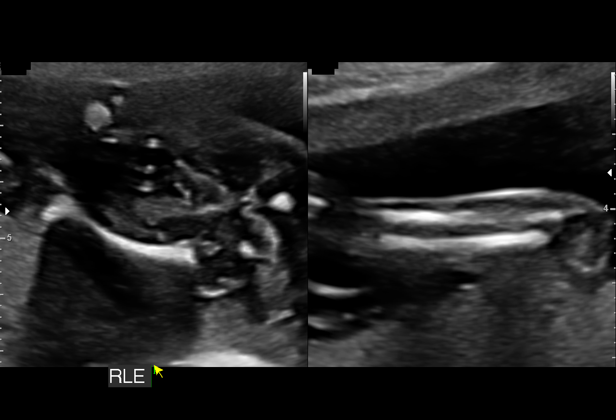
[im 70/112]
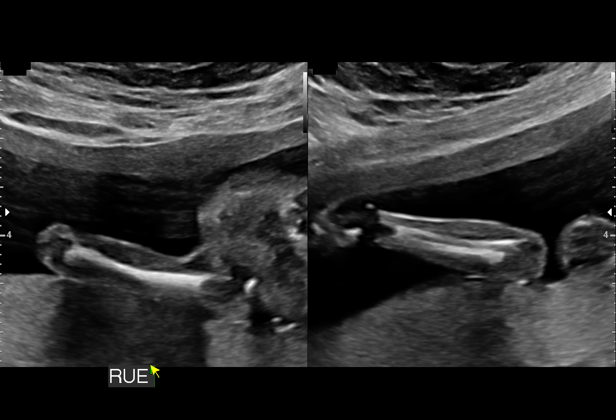
[im 79/112]
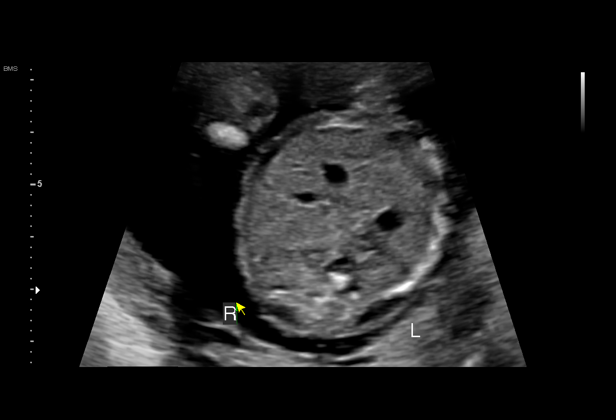
[im 87/112]
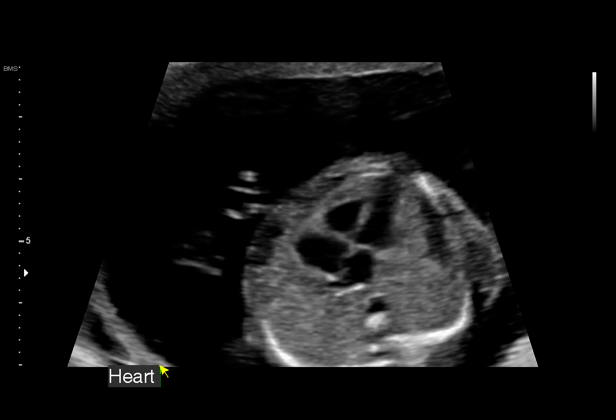
[im 95/112]
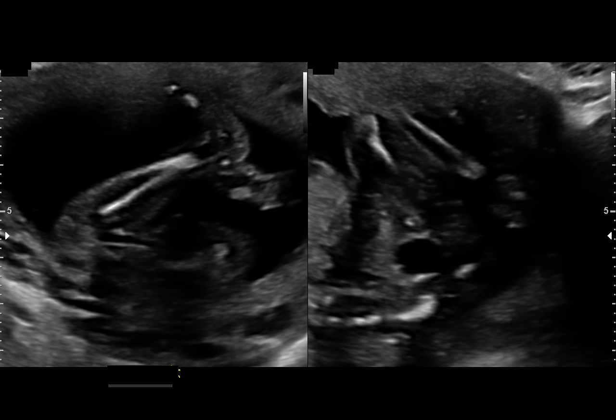
[im 103/112]
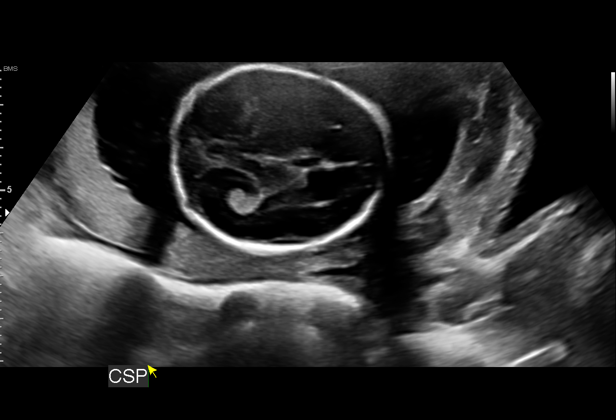
[im 112/112]
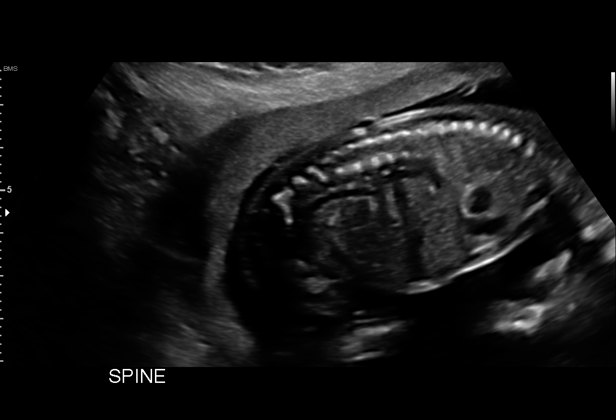

[14 of 28 positions shown; findings below may reference images not displayed]

[REDACTED]

Indications

 Advanced maternal age multigravida 35+,        [1A]
 second trimester
 Pre-existing diabetes, type 1, in pregnancy,   [1A]
 second trimester
 Hypertension - Chronic/Pre-existing            [1A]
 (Labetalol, Procardia)
 Encounter for antenatal screening for          [1A]
 malformations
 Poor obstetric history: Previous               [1A]
 preeclampsia / eclampsia/gestational HTN
 Medical complication of pregnancy (Female      [1A]
 circumcision)
 18 weeks gestation of pregnancy
Fetal Evaluation

 Num Of Fetuses:         1
 Fetal Heart Rate(bpm):  144
 Cardiac Activity:       Observed
 Presentation:           Breech
 Placenta:               Posterior
 P. Cord Insertion:      Visualized, central

 Amniotic Fluid
 AFI FV:      Within normal limits

                             Largest Pocket(cm)

Biometry

 BPD:      42.1  mm     G. Age:  18w 5d         65  %    CI:        78.97   %    70 - 86
                                                         FL/HC:      17.6   %    15.8 - 18
 HC:      149.8  mm     G. Age:  18w 0d         24  %    HC/AC:      1.11        1.07 -
 AC:      134.6  mm     G. Age:  18w 6d         64  %    FL/BPD:     62.7   %
 FL:       26.4  mm     G. Age:  18w 0d         29  %    FL/AC:      19.6   %    20 - 24
 HUM:      25.6  mm     G. Age:  18w 0d         42  %
 CER:        20  mm     G. Age:  19w 2d         86  %
 NFT:       2.4  mm

 LV:        8.3  mm
 CM:        3.9  mm

 Est. FW:     242  gm      0 lb 9 oz     48  %
OB History

 Gravidity:    3
 Living:       1
Gestational Age

 LMP:           19w 2d        Date:  [DATE]                 EDD:   [DATE]
 U/S Today:     18w 3d                                        EDD:   [DATE]
 Best:          18w 3d     Det. By:  U/S ([DATE])           EDD:   [DATE]
Anatomy

 Cranium:               Appears normal         Aortic Arch:            Appears normal
 Cavum:                 Appears normal         Ductal Arch:            Appears normal
 Ventricles:            Appears normal         Diaphragm:              Appears normal
 Choroid Plexus:        Appears normal         Stomach:                Appears normal, left
                                                                       sided
 Cerebellum:            Appears normal         Abdomen:                Appears normal
 Posterior Fossa:       Appears normal         Abdominal Wall:         Appears nml (cord
                                                                       insert, abd wall)
 Nuchal Fold:           Appears normal         Cord Vessels:           Appears normal (3
                                                                       vessel cord)
 Face:                  Appears normal         Kidneys:                Appear normal
                        (orbits and profile)
 Lips:                  Appears normal         Bladder:                Appears normal
 Thoracic:              Appears normal         Spine:                  Not well visualized
 Heart:                 Not well visualized    Upper Extremities:      Appears normal
 RVOT:                  Appears normal         Lower Extremities:      Appears normal
 LVOT:                  Appears normal

 Other:  Heels/feet and open hands/5th digits visualized. Nasal bone, lenses,
         maxilla, mandible and falx visualized. Fetus appears to be a male.
         VC, 3VV visualized.
Doppler - Fetal Vessels

 Umbilical Artery
  S/D                RI               PI                     ADFV    RDFV
  4.25             0.76              1.2                        No      No

Cervix Uterus Adnexa
 Cervix
 Length:           2.99  cm.
 Normal appearance by transabdominal scan.

 Uterus
 No abnormality visualized.

 Right Ovary
 Not visualized.

 Left Ovary
 Not visualized.

 Cul De Sac
 No free fluid seen.

 Adnexa
 No adnexal mass visualized.
Impression

 We performed a fetal anatomical survey.  Amniotic fluid is
 normal and good fetal activity seen.  No markers of
 aneuploidies or fetal structural defects are seen.  Fetal
 biometry is consistent 18 weeks and 3 days gestation.

 We have assigned her EDD at [DATE].
 xxxxxxxxxxxxxxxxxxxxxxxxxxxxxxxxxxxxxxxxxxxxxxxxxxxxxxxxx
 Consultation (see [REDACTED] )
 I had the pleasure of seeing Ms. KARL today at the
 [HOSPITAL].  I obtained history and
 counseled her with the help of Arabic language interpreter
 was present in the room. She is G3 [1A] at 18-weeks'
 gestation and is here for fetal anatomy scan.  Patient is
 unsure of her LMP date and had 1 or 2 cycles after delivery.
 Her problems include:
 -Pregestational diabetes with history of diabetic ketoacidosis
 (DKA) in this pregnancy.
 -Chronic hypertension.
 -Advanced maternal age.
 -History of preeclampsia with severe features.

 Patient has pregestational diabetes (type I).  She had multiple
 admissions for DKA.  Most recently, she was admitted on
 [DATE] with DKA and was discharged 2 days later.
 Patient reports she takes Lantus insulin 20 units in the
 morning and 20 units in the evening and Humalog
 [DATE] with meals reviewed her blood glucose log and
 the fasting levels are between 87 and 115 and some
 postprandial levels or above 200 mg/dL. Recent hemoglobin
 A1C ([DATE]) was 10.4%

 She has chronic hypertension and takes nifedipine XL 90 mg
 daily and labetalol 100 mg twice daily.  Her blood pressures
 today at her office were 155/81 and 154/82.  She has
 intermittent headaches, blurry vision, and epigastric pain.  At
 the time of ultrasound, the patient was cheerful and not in
 distress.  She was evaluated at the [facility] week for
 complaints of upper abdominal pain and had IV infusions to
 correct hypokalemia.

 Advanced maternal age.  On cell-free fetal DNA screening,
 the risks of fetal aneuploidies are not increased.

 Obstetric history: In [DATE], she had preterm vaginal
 delivery of a female infant weighing 4 pounds and 2 ounces
 at birth.  Her pregnancy was complicated by preeclampsia
 with severe features and she had induction of labor at 34
 weeks gestation.  She had 1 early miscarriage.
 Allergies: No known drug allergies.
 Medications: Nifedipine, labetalol, aspirin, prenatal vitamins,
 insulin.
 Social history: Denies tobacco or drug or alcohol use.
 Prenatal: On cell-free fetal DNA screening, the risks of fetal
 aneuploidies are not increased.  MSAFP screening showed
 low risk for open neural tube defects.  In this pregnancy, the
 patient was seen by her cardiologist for shortness of breath,
 and she will be having an echocardiography tomorrow.

 Our concerns include
 Pregestational diabetes with history of DKA
 -DKA is seen in about 5% to 10% of pregestational diabetes.
 It is more common in type 1 diabetes.  Risk factors include
 urinary tract infections, respiratory infections and poor
 compliance with insulin.  I counseled her on the importance
 of good blood glucose control to prevent this serious
 outcome.  DKA is associated with increased to maternal and
 fetal mortality.
 -Complications of diabetes include fetal macrosomia leading
 to shoulder dystocia and neurological injuries at birth,
 stillbirth, and neonatal respiratory syndrome and increased
 NICU admissions.
 -Recent hemoglobin A1c was 10.4%.  The risk of fetal
 congenital malformations is increased with increased
 hemoglobin A1c (up to 15%).
 -I discussed ultrasound protocol of serial fetal growth
 monitoring and weekly BPP from 32 weeks gestation till
 delivery.
 -Patient was made aware that poor compliance can lead to
 frequent hospitalizations.
 -I recommended fetal echocardiography

 Chronic hypertension
 -Adverse outcomes of severe chronic hypertension include
 maternal stroke, endorgan damage, coagulation disturbances
 and placental abruption.  Superimposed preeclampsia occurs
 in 30% of women with chronic hypertension.  I discussed the
 benefit of low-dose aspirin prophylaxis that helps delaying or
 preventing preeclampsia.
 -History of preeclampsia with severe features increases
 recurrence rate up to 50%.
 -I discussed the safety profile of antihypertensives.  Labetalol
 and nifedipine can be safely given in pregnancy.  It can be
 associated with low birthweights.
 -I discussed ultrasound protocol of monitoring fetal growth
 assessment and antenatal testing.

 Timing of delivery: Since she has comorbid conditions of
 diabetes and hypertension, early term delivery is advised (37-
 or 38-weeks gestation) to prevent adverse fetal or neonatal
 outcomes.
Recommendations

 -An appointment was made for her to return in 4 weeks for
 completion of fetal anatomy.
 -We have requested an appointment for fetal
 echocardiography.
 -Fetal growth assessments every 4 weeks.
 -Weekly BPP from 32 weeks gestation till delivery. If diabetes
 or hypertension is not well controlled, twice-weekly antenatal
 testing will be recommended.
 -Delivery at 37- or 38-weeks' gestation.
 -Ophthalmologic examination to rule out proliferative
 retinopathy.
 -Patient to be added to Red Chart Rounds.
                 KARL

## 2022-02-02 NOTE — Progress Notes (Signed)
Pt c.o headache and blurred vision for the past 3 days, reports epigastric pain for the past week. No edema in face , hands or feet. ?

## 2022-02-02 NOTE — Progress Notes (Signed)
Maternal-Fetal Medicine  ? ?Name: Kristin Clay ?DOB: 1986/10/15 ?MRN: [id number] ?Referring Provider: Leticia Penna, DO ? ?I had the pleasure of seeing [name] at the [facility].  I obtained history and counseled her with the help of Arabic language interpreter was present in the room. She is G3 P0111 at 18-weeks' gestation and is here for fetal anatomy scan.  Patient is unsure of her LMP date and had 1 or 2 cycles after delivery. 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 ?-Chronic hypertension. ?-Advanced maternal age. ?-History of preeclampsia with severe features. ? ?Patient has pregestational diabetes (type I).  She had multiple admissions for DKA.  Most recently, she was admitted on 01/05/2022 with DKA and was discharged 2 days later.  Patient reports she takes Lantus insulin 20 units in the morning and 20 units in the evening and Humalog 11/04/2011 with meals reviewed her blood glucose log and the fasting levels are between 87 and 115 and some postprandial levels or above 200 mg/dL. Recent hemoglobin A1C (Jan 2023) was 10.4% ? ?She has chronic hypertension and takes nifedipine XL 90 mg daily and labetalol 100 mg twice daily.  Her blood pressures today at her office were 155/81 and 154/82.  She has intermittent headaches, blurry vision, and epigastric pain.  At the time of ultrasound, the patient was cheerful and not in distress.  She was evaluated at the [facility] last week for complaints of upper abdominal pain and had IV infusions to correct hypokalemia. ? ?Advanced maternal age.  On cell-free fetal DNA screening, the risks of fetal aneuploidies are not increased. ? ?Obstetric history: In August 2022, she had preterm vaginal delivery of a female infant weighing 4 pounds and 2 ounces at birth.  Her pregnancy was complicated by preeclampsia with severe features and she had induction of labor at [redacted] weeks gestation.   She had 1 early miscarriage. ?Allergies: No known drug allergies. ?Medications: Nifedipine, labetalol, aspirin, prenatal vitamins, insulin. ?Social history: Denies tobacco or drug or alcohol use. ?Prenatal: On cell-free fetal DNA screening, the risks of fetal aneuploidies are not increased.  MSAFP screening showed low risk for open neural tube defects.  In this pregnancy, the patient was seen by her cardiologist for shortness of breath, and she will be having an echocardiography tomorrow. ? ?Ultrasound ?We performed a fetal anatomical survey.  Amniotic fluid is normal and good fetal activity seen.  No markers of aneuploidies or fetal structural defects are seen.  Fetal biometry is consistent 18 weeks and 3 days gestation. ?We have assigned her EDD at 07/03/2022. ? ?Our concerns include ?Pregestational diabetes with history of DKA ?-DKA is seen in about 5% to 10% of pregestational diabetes.  It is more common in type 1 diabetes.  Risk factors include urinary tract infections, respiratory infections and poor compliance with insulin.  I counseled her on the importance of good blood glucose control to prevent this serious outcome.  DKA is associated with increased to maternal and fetal mortality. ?-Complications of diabetes include fetal macrosomia leading to shoulder dystocia and neurological injuries at birth, stillbirth, and neonatal respiratory syndrome and increased NICU admissions. ?-Recent hemoglobin A1c was 10.4%.  The risk of fetal congenital malformations is increased with increased hemoglobin A1c (up to 15%). ?-I discussed ultrasound protocol of serial fetal growth monitoring and weekly BPP from [redacted] weeks gestation till delivery. ?-Patient was made aware that poor compliance can lead to frequent hospitalizations. ?-I recommended fetal echocardiography ? ?  Chronic hypertension ?-Adverse outcomes of severe chronic hypertension include maternal stroke, endorgan damage, coagulation disturbances and placental  abruption.  Superimposed preeclampsia occurs in 30% of women with chronic hypertension.  I discussed the benefit of low-dose aspirin prophylaxis that helps delaying or preventing preeclampsia. ?-History of preeclampsia with severe features increases recurrence rate up to 50%. ?-I discussed the safety profile of antihypertensives.  Labetalol and nifedipine can be safely given in pregnancy.  It can be associated with low birthweights.   ?-I discussed ultrasound protocol of monitoring fetal growth assessment and antenatal testing. ? ?Timing of delivery: Since she has comorbid conditions of diabetes and hypertension, early term delivery is advised (37- or 38-weeks gestation) to prevent adverse fetal or neonatal outcomes. ? ?Recommendations ?-An appointment was made for her to return in 4 weeks for completion of fetal anatomy. ?-We have requested an appointment for fetal echocardiography. ?-Fetal growth assessments every 4 weeks. ?-Weekly BPP from [redacted] weeks gestation till delivery. If diabetes or hypertension is not well controlled, twice-weekly antenatal testing will be recommended. ?-Delivery at 37- or 38-weeks' gestation. ?-Ophthalmologic examination to rule out proliferative retinopathy. ?-Patient to be added to Red Chart Rounds. ? ? ?Thank you for consultation.  If you have any questions or concerns, please contact me the [facility].  Consultation including face-to-face (more than 50%) counseling 45 minutes. ? ?

## 2022-02-03 ENCOUNTER — Other Ambulatory Visit: Payer: Self-pay | Admitting: *Deleted

## 2022-02-03 ENCOUNTER — Ambulatory Visit (HOSPITAL_BASED_OUTPATIENT_CLINIC_OR_DEPARTMENT_OTHER): Payer: Medicaid Other

## 2022-02-03 ENCOUNTER — Telehealth: Payer: Self-pay

## 2022-02-03 DIAGNOSIS — O10919 Unspecified pre-existing hypertension complicating pregnancy, unspecified trimester: Secondary | ICD-10-CM

## 2022-02-03 DIAGNOSIS — R0789 Other chest pain: Secondary | ICD-10-CM

## 2022-02-03 DIAGNOSIS — O24012 Pre-existing diabetes mellitus, type 1, in pregnancy, second trimester: Secondary | ICD-10-CM

## 2022-02-03 DIAGNOSIS — O0990 Supervision of high risk pregnancy, unspecified, unspecified trimester: Secondary | ICD-10-CM | POA: Diagnosis not present

## 2022-02-03 LAB — ECHOCARDIOGRAM COMPLETE
Area-P 1/2: 4.74 cm2
S' Lateral: 2.4 cm

## 2022-02-03 NOTE — Telephone Encounter (Addendum)
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 ?Called pt with [employer] and informed pt results and recommended that call  [name] genetic counseling at [phone] in which her results will be explained in great detail.  Pt verbalized understanding with no further questions.   ? ?Canuto Kingston,RN  ?02/03/22 ?

## 2022-02-05 ENCOUNTER — Telehealth: Payer: Self-pay | Admitting: Family Medicine

## 2022-02-05 ENCOUNTER — Encounter: Payer: Medicaid Other | Admitting: Obstetrics & Gynecology

## 2022-02-05 NOTE — Telephone Encounter (Signed)
Called patient using interpreter that came for her appointment, there was no answer to the call so a voicemail was left with the call back number for the office.  ?

## 2022-02-06 NOTE — Progress Notes (Signed)
?[facility] Note ? ?02/09/2022 ? ?  ? ?CHIEF COMPLAINT ?Patient presents for Retina Evaluation ? ? ?HISTORY OF PRESENT ILLNESS: ?Kristin Clay is a 36 y.o. female who presents to the clinic today for:  ? ?HPI   ? ? Retina Evaluation   ?In both eyes.  I, the attending physician,  performed the HPI with the patient and updated documentation appropriately. ? ?  ?  ? ? Comments   ?Patient here for Retina Evaluation. Referred by Dr Frederico Hamman. Patient states vision good. Blurry at times. No eye pain. Patient is [redacted] weeks pregnant. ? ?  ?  ?Last edited by Bernarda Caffey, MD on 02/09/2022  5:20 PM.  ?  ?Patient states has uncontrolled BS, up to 390's. Patein is [redacted] weeks pregnant. Diagnosed with diabetes 13 years ago.  ? ?Referring physician: ?Gevena Cotton MD ?[place], [place] [address] ? ?HISTORICAL INFORMATION:  ? ?Selected notes from the [place] ?Referred by Dr. Frederico Hamman for diabetic retinal evaluation ?LEE:  ?Ocular Hx- ?PMH-pregnancy, DM ?  ? ?CURRENT MEDICATIONS: ?No current outpatient medications on file. (Ophthalmic Drugs)  ? ?No current facility-administered medications for this visit. (Ophthalmic Drugs)  ? ?Current Outpatient Medications (Other)  ?Medication Sig  ? Accu-Chek Softclix Lancets lancets Use as instructed; check blood glucose 4 times daily  ? acetaminophen (TYLENOL) 500 MG tablet Take 1,000 mg by mouth every 6 (six) hours as needed.  ? ASPIRIN LOW DOSE 81 MG chewable tablet CHEW 1 TABLET BY MOUTH DAILY.  ? Blood Glucose Monitoring Suppl (ACCU-CHEK GUIDE) w/Device KIT use as directed 4 (four) times daily.  ? Continuous Blood Gluc Transmit (DEXCOM G6 TRANSMITTER) MISC 1 Device by Does not apply route every 3 (three) months.  ? glucose blood (ACCU-CHEK GUIDE) test strip Use as instructed; check blood glucose 4 times daily  ? insulin aspart (NOVOLOG) 100 UNIT/ML FlexPen Inject 7 Units into the skin 3 (three) times daily with meals.   ? insulin glargine (LANTUS) 100 UNIT/ML Solostar Pen Inject 15 Units into the skin 2 (two) times daily.  ? labetalol (NORMODYNE) 100 MG tablet Take 100 mg by mouth 2 (two) times daily.  ? metoCLOPramide (REGLAN) 10 MG tablet Take 1 tablet (10 mg total) by mouth 4 (four) times daily as needed for nausea or vomiting.  ? NIFEdipine (ADALAT CC) 90 MG 24 hr tablet Take 90 mg by mouth daily.  ? ondansetron (ZOFRAN-ODT) 4 MG disintegrating tablet Take 1 tablet (4 mg total) by mouth every 6 (six) hours as needed for nausea.  ? Prenatal Vit-Fe Fumarate-FA (PRENATAL VITAMIN) 27-0.8 MG TABS Take 1 tablet by mouth daily.  ? prochlorperazine (COMPAZINE) 10 MG tablet Take 1 tablet (10 mg total) by mouth 2 (two) times daily as needed for nausea or vomiting.  ? promethazine (PHENERGAN) 25 MG suppository Place 1 suppository (25 mg total) rectally every 6 (six) hours as needed for nausea or vomiting.  ? scopolamine (TRANSDERM-SCOP) 1 MG/3DAYS Place 1 patch (1.5 mg total) onto the skin every 3 (three) days.  ? calcium carbonate (TUMS) 500 MG chewable tablet Chew 1 tablet (200 mg of elemental calcium total) by mouth 2 (two) times daily as needed for indigestion or heartburn. (Patient not taking: Reported on 02/09/2022)  ? potassium chloride SA (KLOR-CON M) 20 MEQ tablet Take 2 tablets (40 mEq total) by mouth daily for 3 days.  ? ?No current facility-administered medications for this visit. (Other)  ? ?REVIEW  OF SYSTEMS: ?ROS   ?Positive for: Endocrine, Eyes ?Negative for: Constitutional, Gastrointestinal, Neurological, Skin, Genitourinary, Musculoskeletal, HENT, Cardiovascular, Respiratory, Psychiatric, Allergic/Imm, Heme/Lymph ?Last edited by Roselee Nova D, COT on 02/09/2022 10:04 AM.  ?  ? ?ALLERGIES ?Allergies  ?Allergen Reactions  ? Pork-Derived Products   ?  Pt does not want any pork products  ? ?PAST MEDICAL HISTORY ?Past Medical History:  ?Diagnosis Date  ? Chronic hypertension   ? Chronic hypertension with superimposed severe  preeclampsia 06/23/2021  ? Complication of anesthesia   ? with hand surgery, local anes did not work, tried twice- had to put her to sleep  ? Diabetes mellitus type 1 ([place])   ? DKA (diabetic ketoacidosis) ([place]) 10/2020  ? Epidural hematoma 07/04/2021  ? GBS bacteriuria 06/09/2021  ? Obstetric vaginal laceration with type 3c third degree perineal laceration 06/29/2021  ? Poor fetal growth complicating pregnancy, antepartum, third trimester, other fetus 06/19/2021  ? Pyelonephritis 10/2020  ? Rh negative status during pregnancy 05/01/2021  ? s/p Rhogam 05/13/21  ? Type 1 diabetes mellitus with ketoacidosis, uncontrolled ([place]) 02/22/2020  ? Type I diabetes mellitus ([place])   ? dx 79yr ago  ? UTI (urinary tract infection)   ? ?Past Surgical History:  ?Procedure Laterality Date  ? female circumcision Bilateral   ? clitorectomy as well  ? FOOT SURGERY  2018  ? HAND SURGERY  2019  ? ? ?FAMILY HISTORY ?Family History  ?Problem Relation Age of Onset  ? Hyperlipidemia Mother   ? Hypertension Mother   ? Kidney disease Father   ? Alzheimer's disease Father   ? ?SOCIAL HISTORY ?Social History  ? ?Tobacco Use  ? Smoking status: Never  ? Smokeless tobacco: Never  ?Vaping Use  ? Vaping Use: Never used  ?Substance Use Topics  ? Alcohol use: Never  ? Drug use: Never  ? ?  ? ?  ? ?OPHTHALMIC EXAM: ? ?Base Eye Exam   ? ? Visual Acuity (Snellen - Linear)   ? ?   Right Left  ? Dist [place] 20/40 -2 20/25  ? Dist ph [place] 20/25 NI  ? ?  ?  ? ? Tonometry (Tonopen, 9:57 AM)   ? ?   Right Left  ? Pressure 13 13  ? ?  ?  ? ? Pupils   ? ?   Dark Light Shape React APD  ? Right 3 2 Round Brisk None  ? Left 3 2 Round Brisk None  ? ?  ?  ? ? Visual Fields (Counting fingers)   ? ?   Left Right  ? Restrictions Partial outer inferior temporal deficiency Partial outer superior temporal, superior nasal deficiencies  ? ?  ?  ? ? Extraocular Movement   ? ?   Right Left  ?  Full, Ortho Full, Ortho  ? ?  ?  ? ? Neuro/Psych   ? ? Oriented x3: Yes  ? Mood/Affect:  Normal  ? ?  ?  ? ? Dilation   ? ? Both eyes: 1.0% Mydriacyl, 2.5% Phenylephrine @ 9:57 AM  ? ?  ?  ? ?  ? ?Slit Lamp and Fundus Exam   ? ? Slit Lamp Exam   ? ?   Right Left  ? Lids/Lashes normal normal  ? Conjunctiva/Sclera mild melanosis meld milanosis  ? Cornea trace tear film debris trace tear film debris  ? Anterior Chamber deep and quiet deep and quiet  ? Iris round and dilated, no NVI round and dilated,  no NVI  ? Lens Clear Clear  ? Anterior Vitreous syneresis syneresis  ? ?  ?  ? ? Fundus Exam   ? ?   Right Left  ? Disc pink and sharp; compact; mild hyperemia; +fibrosis w/ early, fine NVD; +PPP pink and sharp; compact; +fibrosis w/ early, fine NVD  ? C/D Ratio 0.2 0.2  ? Macula blunted foveal reflex, scattered MA/DBH, exudates, +CWS, fine NVE inferior macula good foveal reflex, ERM with stria, scattered MA/DBH, exudates  ? Vessels attenuated, scattered early fine NVE attenuated, copper wiring, early fine NVE--scattered  ? Periphery attached, 360 MA/DBH attached, 360 MA/DBH  ? ?  ?  ? ?  ? ?Refraction   ? ? Manifest Refraction   ? ?   Sphere Cylinder Axis Dist VA  ? Right -1.00 +0.25 025 20/30  ? Left -0.50 +0.50 155 20/25  ? ?  ?  ? ?  ? ? ?IMAGING AND PROCEDURES  ?Imaging and Procedures for 02/09/2022 ? ?OCT, Retina - OU - Both Eyes   ? ?   ?Right Eye ?Quality was good. Central Foveal Thickness: 269. Progression has no prior data. Findings include intraretinal hyper-reflective material, vitreomacular adhesion , abnormal foveal contour, preretinal fibrosis (Scattered IRF/IRHM, greatest inferior macula. Positive PRF overlying disc).  ? ?Left Eye ?Central Foveal Thickness: 284. Progression has no prior data. Findings include no IRF, no SRF, preretinal fibrosis, macular pucker, vitreous traction (Pre retinal fibrosis overlying disc extending to macula w/ mild traction).  ? ?Notes ?*Images captured and stored on drive ? ?Diagnosis / Impression:  ?PDR w/ DME OU ?OD: Scattered IRF/IRHM, greatest inferior macula.  +PRF overlying disc ?OS: +PRF overlying disc and extending to macula w/ mild traction ? ?Clinical management:  ?See below ? ?Abbreviations: NFP - Normal foveal profile. CME - cystoid macular edema. PED - pi

## 2022-02-09 ENCOUNTER — Ambulatory Visit (INDEPENDENT_AMBULATORY_CARE_PROVIDER_SITE_OTHER): Payer: Medicaid Other | Admitting: Ophthalmology

## 2022-02-09 ENCOUNTER — Encounter (INDEPENDENT_AMBULATORY_CARE_PROVIDER_SITE_OTHER): Payer: Self-pay | Admitting: Ophthalmology

## 2022-02-09 ENCOUNTER — Other Ambulatory Visit: Payer: Self-pay

## 2022-02-09 VITALS — BP 135/83

## 2022-02-09 DIAGNOSIS — H35033 Hypertensive retinopathy, bilateral: Secondary | ICD-10-CM

## 2022-02-09 DIAGNOSIS — I10 Essential (primary) hypertension: Secondary | ICD-10-CM

## 2022-02-09 DIAGNOSIS — E113513 Type 2 diabetes mellitus with proliferative diabetic retinopathy with macular edema, bilateral: Secondary | ICD-10-CM | POA: Diagnosis not present

## 2022-02-09 DIAGNOSIS — H35373 Puckering of macula, bilateral: Secondary | ICD-10-CM

## 2022-02-09 DIAGNOSIS — O0992 Supervision of high risk pregnancy, unspecified, second trimester: Secondary | ICD-10-CM

## 2022-02-10 ENCOUNTER — Telehealth: Payer: Self-pay

## 2022-02-10 NOTE — Telephone Encounter (Signed)
Pt has an appointment on 03/20/2022 at 9:30am with Dr. Lance Bosch, [facility]  ? ?Pt is notify of her appointment ?

## 2022-02-13 NOTE — Progress Notes (Signed)
?[facility] Note ? ?02/16/2022 ? ?  ? ?CHIEF COMPLAINT ?Patient presents for Retina Follow Up ? ? ?HISTORY OF PRESENT ILLNESS: ?Kristin Clay is a 36 y.o. female who presents to the clinic today for:  ? ?HPI   ? ? Retina Follow Up   ?Patient presents with  Diabetic Retinopathy.  In right eye.  Severity is moderate.  Duration of 7 days.  Since onset it is stable.  I, the attending physician,  performed the HPI with the patient and updated documentation appropriately. ? ?  ?  ? ? Comments   ?Pt here for 7 day ret f/u for PRP OD, PDR OU. Pt states VA is about the same.  ? ?  ?  ?Last edited by Bernarda Caffey, MD on 02/16/2022 10:04 PM.  ?  ?Patient states has uncontrolled BS, up to 390's. Patein is [redacted] weeks pregnant. Diagnosed with diabetes 13 years ago.  ? ?Referring physician: ?Gevena Cotton MD ?[place], [place] [address] ? ?HISTORICAL INFORMATION:  ? ?Selected notes from the [facility] ?Referred by Dr. Frederico Hamman for diabetic retinal evaluation ?LEE:  ?Ocular Hx- ?PMH-pregnancy, DM ?  ? ?CURRENT MEDICATIONS: ?No current outpatient medications on file. (Ophthalmic Drugs)  ? ?No current facility-administered medications for this visit. (Ophthalmic Drugs)  ? ?Current Outpatient Medications (Other)  ?Medication Sig  ? Accu-Chek Softclix Lancets lancets Use as instructed; check blood glucose 4 times daily  ? acetaminophen (TYLENOL) 500 MG tablet Take 1,000 mg by mouth every 6 (six) hours as needed.  ? ASPIRIN LOW DOSE 81 MG chewable tablet CHEW 1 TABLET BY MOUTH DAILY.  ? Blood Glucose Monitoring Suppl (ACCU-CHEK GUIDE) w/Device KIT use as directed 4 (four) times daily.  ? Continuous Blood Gluc Transmit (DEXCOM G6 TRANSMITTER) MISC 1 Device by Does not apply route every 3 (three) months.  ? glucose blood (ACCU-CHEK GUIDE) test strip Use as instructed; check blood glucose 4 times daily  ? insulin aspart (NOVOLOG) 100 UNIT/ML FlexPen Inject 7 Units  into the skin 3 (three) times daily with meals.  ? insulin glargine (LANTUS) 100 UNIT/ML Solostar Pen Inject 15 Units into the skin 2 (two) times daily.  ? labetalol (NORMODYNE) 100 MG tablet Take 100 mg by mouth 2 (two) times daily.  ? metoCLOPramide (REGLAN) 10 MG tablet Take 1 tablet (10 mg total) by mouth 4 (four) times daily as needed for nausea or vomiting.  ? NIFEdipine (ADALAT CC) 90 MG 24 hr tablet Take 90 mg by mouth daily.  ? ondansetron (ZOFRAN-ODT) 4 MG disintegrating tablet Take 1 tablet (4 mg total) by mouth every 6 (six) hours as needed for nausea.  ? Prenatal Vit-Fe Fumarate-FA (PRENATAL VITAMIN) 27-0.8 MG TABS Take 1 tablet by mouth daily.  ? prochlorperazine (COMPAZINE) 10 MG tablet Take 1 tablet (10 mg total) by mouth 2 (two) times daily as needed for nausea or vomiting.  ? promethazine (PHENERGAN) 25 MG suppository Place 1 suppository (25 mg total) rectally every 6 (six) hours as needed for nausea or vomiting.  ? scopolamine (TRANSDERM-SCOP) 1 MG/3DAYS Place 1 patch (1.5 mg total) onto the skin every 3 (three) days.  ? calcium carbonate (TUMS) 500 MG chewable tablet Chew 1 tablet (200 mg of elemental calcium total) by mouth 2 (two) times daily as needed for indigestion or heartburn. (Patient not taking: Reported on 02/09/2022)  ? potassium chloride SA (KLOR-CON M) 20 MEQ tablet Take 2 tablets (40 mEq total)  by mouth daily for 3 days.  ? ?No current facility-administered medications for this visit. (Other)  ? ?REVIEW OF SYSTEMS: ?ROS   ?Positive for: Endocrine, Eyes ?Negative for: Constitutional, Gastrointestinal, Neurological, Skin, Genitourinary, Musculoskeletal, HENT, Cardiovascular, Respiratory, Psychiatric, Allergic/Imm, Heme/Lymph ?Last edited by Kingsley Spittle, COT on 02/16/2022  3:03 PM.  ?  ? ?ALLERGIES ?Allergies  ?Allergen Reactions  ? Pork-Derived Products   ?  Pt does not want any pork products  ? ?PAST MEDICAL HISTORY ?Past Medical History:  ?Diagnosis Date  ? Chronic  hypertension   ? Chronic hypertension with superimposed severe preeclampsia 06/23/2021  ? Complication of anesthesia   ? with hand surgery, local anes did not work, tried twice- had to put her to sleep  ? Diabetes mellitus type 1 ([place])   ? Diabetic retinopathy ([place])   ? DKA (diabetic ketoacidosis) ([place]) 10/2020  ? Epidural hematoma 07/04/2021  ? GBS bacteriuria 06/09/2021  ? Obstetric vaginal laceration with type 3c third degree perineal laceration 06/29/2021  ? Poor fetal growth complicating pregnancy, antepartum, third trimester, other fetus 06/19/2021  ? Pyelonephritis 10/2020  ? Rh negative status during pregnancy 05/01/2021  ? s/p Rhogam 05/13/21  ? Type 1 diabetes mellitus with ketoacidosis, uncontrolled ([place]) 02/22/2020  ? Type I diabetes mellitus ([place])   ? dx 61yr ago  ? UTI (urinary tract infection)   ? ?Past Surgical History:  ?Procedure Laterality Date  ? female circumcision Bilateral   ? clitorectomy as well  ? FOOT SURGERY  2018  ? HAND SURGERY  2019  ? ?FAMILY HISTORY ?Family History  ?Problem Relation Age of Onset  ? Hyperlipidemia Mother   ? Hypertension Mother   ? Kidney disease Father   ? Alzheimer's disease Father   ? ?SOCIAL HISTORY ?Social History  ? ?Tobacco Use  ? Smoking status: Never  ? Smokeless tobacco: Never  ?Vaping Use  ? Vaping Use: Never used  ?Substance Use Topics  ? Alcohol use: Never  ? Drug use: Never  ?  ? ?  ?OPHTHALMIC EXAM: ? ?Base Eye Exam   ? ? Visual Acuity (Snellen - Linear)   ? ?   Right Left  ? Dist [place] 20/30 -1 20/25 -2  ? ?  ?  ? ? Tonometry (Tonopen, 3:08 PM)   ? ?   Right Left  ? Pressure 16 def  ? ?  ?  ? ? Pupils   ? ?   Dark Light Shape React APD  ? Right 3 2 Round Brisk None  ? Left 3 2 Round Brisk None  ? ?  ?  ? ? Visual Fields (Counting fingers)   ? ?   Left Right  ? Restrictions Partial outer inferior temporal deficiency Partial outer superior temporal, superior nasal deficiencies  ? ?  ?  ? ? Extraocular Movement   ? ?   Right Left  ?  Full, Ortho Full, Ortho   ? ?  ?  ? ? Neuro/Psych   ? ? Oriented x3: Yes  ? Mood/Affect: Normal  ? ?  ?  ? ? Dilation   ? ? Right eye: 1.0% Mydriacyl, 2.5% Phenylephrine @ 3:09 PM  ? ?  ?  ? ?  ? ?Slit Lamp and Fundus Exam   ? ? Slit Lamp Exam   ? ?   Right Left  ? Lids/Lashes normal normal  ? Conjunctiva/Sclera mild melanosis meld milanosis  ? Cornea trace tear film debris trace tear film debris  ? Anterior Chamber  deep and quiet deep and quiet  ? Iris round and dilated, no NVI round and dilated, no NVI  ? Lens Clear Clear  ? Anterior Vitreous syneresis syneresis  ? ?  ?  ? ? Fundus Exam   ? ?   Right Left  ? Disc pink and sharp; compact; mild hyperemia; +fibrosis w/ early, fine NVD; +PPP pink and sharp; compact; +fibrosis w/ early, fine NVD  ? C/D Ratio 0.2 0.2  ? Macula blunted foveal reflex, scattered MA/DBH, exudates, +CWS, fine NVE inferior macula good foveal reflex, ERM with stria, scattered MA/DBH, exudates  ? Vessels attenuated, scattered early fine NVE attenuated, copper wiring, early fine NVE--scattered  ? Periphery attached, 360 MA/DBH attached, 360 MA/DBH  ? ?  ?  ? ?  ? ? ?IMAGING AND PROCEDURES  ?Imaging and Procedures for 02/16/2022 ? ?OCT, Retina - OU - Both Eyes   ? ?   ?Right Eye ?Quality was good. Central Foveal Thickness: 264. Progression has been stable. Findings include intraretinal hyper-reflective material, vitreomacular adhesion , abnormal foveal contour, preretinal fibrosis (Scattered IRF/IRHM, greatest inferior macula. Positive PRF overlying disc).  ? ?Left Eye ?Quality was good. Central Foveal Thickness: 284. Progression has been stable. Findings include no IRF, no SRF, preretinal fibrosis, macular pucker, vitreous traction (No images taken today Pre retinal fibrosis overlying disc extending to macula w/ mild traction- prior).  ? ?Notes ?*Images captured and stored on drive ? ?Diagnosis / Impression:  ?PDR w/ DME OU ?OD: Scattered IRF/IRHM, greatest inferior macula. +PRF overlying disc ?OS: +PRF overlying disc  and extending to macula w/ mild traction ? ?Clinical management:  ?See below ? ?Abbreviations: NFP - Normal foveal profile. CME - cystoid macular edema. PED - pigment epithelial detachment. IRF - intraretinal fluid

## 2022-02-16 ENCOUNTER — Ambulatory Visit (INDEPENDENT_AMBULATORY_CARE_PROVIDER_SITE_OTHER): Payer: Medicaid Other | Admitting: Ophthalmology

## 2022-02-16 ENCOUNTER — Other Ambulatory Visit: Payer: Self-pay

## 2022-02-16 ENCOUNTER — Encounter (INDEPENDENT_AMBULATORY_CARE_PROVIDER_SITE_OTHER): Payer: Self-pay | Admitting: Ophthalmology

## 2022-02-16 DIAGNOSIS — I10 Essential (primary) hypertension: Secondary | ICD-10-CM

## 2022-02-16 DIAGNOSIS — O0992 Supervision of high risk pregnancy, unspecified, second trimester: Secondary | ICD-10-CM

## 2022-02-16 DIAGNOSIS — E113513 Type 2 diabetes mellitus with proliferative diabetic retinopathy with macular edema, bilateral: Secondary | ICD-10-CM | POA: Diagnosis not present

## 2022-02-16 DIAGNOSIS — H35373 Puckering of macula, bilateral: Secondary | ICD-10-CM

## 2022-02-16 DIAGNOSIS — H35033 Hypertensive retinopathy, bilateral: Secondary | ICD-10-CM

## 2022-02-18 ENCOUNTER — Ambulatory Visit (INDEPENDENT_AMBULATORY_CARE_PROVIDER_SITE_OTHER): Payer: Medicaid Other | Admitting: Family Medicine

## 2022-02-18 VITALS — BP 137/88 | HR 90 | Wt 118.0 lb

## 2022-02-18 DIAGNOSIS — N90810 Female genital mutilation status, unspecified: Secondary | ICD-10-CM

## 2022-02-18 DIAGNOSIS — Z6791 Unspecified blood type, Rh negative: Secondary | ICD-10-CM

## 2022-02-18 DIAGNOSIS — O10919 Unspecified pre-existing hypertension complicating pregnancy, unspecified trimester: Secondary | ICD-10-CM

## 2022-02-18 DIAGNOSIS — O26899 Other specified pregnancy related conditions, unspecified trimester: Secondary | ICD-10-CM

## 2022-02-18 DIAGNOSIS — O0990 Supervision of high risk pregnancy, unspecified, unspecified trimester: Secondary | ICD-10-CM

## 2022-02-18 DIAGNOSIS — I10 Essential (primary) hypertension: Secondary | ICD-10-CM

## 2022-02-18 DIAGNOSIS — M25512 Pain in left shoulder: Secondary | ICD-10-CM

## 2022-02-18 DIAGNOSIS — M25511 Pain in right shoulder: Secondary | ICD-10-CM

## 2022-02-18 DIAGNOSIS — Z789 Other specified health status: Secondary | ICD-10-CM

## 2022-02-18 DIAGNOSIS — E1039 Type 1 diabetes mellitus with other diabetic ophthalmic complication: Secondary | ICD-10-CM

## 2022-02-18 MED ORDER — LABETALOL HCL 100 MG PO TABS: 100.0000 mg | ORAL_TABLET | Freq: Two times a day (BID) | ORAL | 3 refills | Status: DC

## 2022-02-18 MED ORDER — NIFEDIPINE ER 90 MG PO TB24: 90.0000 mg | ORAL_TABLET | Freq: Every day | ORAL | 3 refills | Status: DC

## 2022-02-18 MED ORDER — INSULIN ASPART 100 UNIT/ML FLEXPEN: 12.0000 [IU] | PEN_INJECTOR | Freq: Three times a day (TID) | SUBCUTANEOUS | Status: DC

## 2022-02-18 MED ORDER — ASPIRIN 81 MG PO CHEW: CHEWABLE_TABLET | ORAL | 1 refills | Status: DC

## 2022-02-18 MED ORDER — INSULIN GLARGINE 100 UNIT/ML SOLOSTAR PEN: 20.0000 [IU] | PEN_INJECTOR | Freq: Two times a day (BID) | SUBCUTANEOUS | 11 refills | Status: DC

## 2022-02-18 NOTE — Progress Notes (Signed)
Subjective:  ?Kristin Clay is a 36 y.o. [phone] at [redacted]w[redacted]d being seen today for ongoing prenatal care.  She is currently monitored for the following issues for this high-risk pregnancy and has Female circumcision; Language barrier; Poor compliance; Hypertension; Supervision of high risk pregnancy, antepartum; Rh negative, antepartum; Type 1 diabetes mellitus affecting pregnancy in second trimester, antepartum; Chronic hypertension affecting pregnancy; Short interval between pregnancies affecting pregnancy, antepartum; History of severe preeclampsia, prior pregnancy, currently pregnant; Previous preterm delivery at 33w6 due to severe preeclampsia, antepartum; History of forceps delivery in prior pregnancy, currently pregnant; Proteinuria affecting pregnancy; Atypical chest pain; Blurry vision; DM type 1 causing eye disease ([place]); Shortness of breath; Alpha thalassemia silent carrier; DKA, type 1 ([place]); and Hypokalemia on their problem list. ? ?GDM: Patient taking lantus 15 units BID.  Reports occasional hypoglycemic episodes.  Tolerating medication well ?Fasting: 200-225 ?2hr PP: 150-175 ? ?Patient reports no complaints.  Contractions: Not present. Vag. Bleeding: None.  Movement: Absent. Denies leaking of fluid.  ? ?The following portions of the patient's history were reviewed and updated as appropriate: allergies, current medications, past family history, past medical history, past social history, past surgical history and problem list. Problem list updated. ? ?Objective:  ? ?Vitals:  ? 02/18/22 1453  ?BP: 137/88  ?Pulse: 90  ?Weight: 118 lb (53.5 kg)  ? ? ?Fetal Status: Fetal Heart Rate (bpm): 155   Movement: Absent    ? ?General:  Alert, oriented and cooperative. Patient is in no acute distress.  ?Skin: Skin is warm and dry. No rash noted.   ?Cardiovascular: Normal heart rate noted  ?Respiratory: Normal respiratory effort, no problems with respiration noted  ?Abdomen: Soft, gravid, appropriate for  gestational age. Pain/Pressure: Present     ?Pelvic: Vag. Bleeding: None     ?Cervical exam deferred        ?Extremities: Normal range of motion.  Edema: None  ?Mental Status: Normal mood and affect. Normal behavior. Normal judgment and thought content.  ? ?Urinalysis:     ? ?Assessment and Plan:  ?Pregnancy: [id number] at [redacted]w[redacted]d ? ?1. Supervision of high risk pregnancy, antepartum ?FHT and FH normal ? ?2. DM type 1 causing eye disease ([place]) ?Increase lantus to 20 units in AM and 25 units in PM ?Continue aspart 12 units AC. ? ?3. Rh negative, antepartum ?Rhogam at 28 weeks ? ?4. Language barrier ?Interpreter used ? ?5. Primary hypertension ?On labetalol, procardia ?Taking ASA 81mg  ? ?6. Female circumcision ? ? ?Preterm labor symptoms and general obstetric precautions including but not limited to vaginal bleeding, contractions, leaking of fluid and fetal movement were reviewed in detail with the patient. ?Please refer to After Visit Summary for other counseling recommendations.  ?No follow-ups on file. ? ? ?Truett Mainland, DO ? ?

## 2022-02-23 ENCOUNTER — Encounter (HOSPITAL_COMMUNITY): Payer: Self-pay | Admitting: Family Medicine

## 2022-02-23 ENCOUNTER — Other Ambulatory Visit: Payer: Self-pay

## 2022-02-23 ENCOUNTER — Inpatient Hospital Stay (HOSPITAL_COMMUNITY)
Admission: AD | Admit: 2022-02-23 | Discharge: 2022-02-23 | Disposition: A | Discharge status: Home / Self Care | Payer: Medicaid Other | Attending: Obstetrics and Gynecology | Admitting: Obstetrics and Gynecology

## 2022-02-23 DIAGNOSIS — O26899 Other specified pregnancy related conditions, unspecified trimester: Secondary | ICD-10-CM

## 2022-02-23 DIAGNOSIS — Z6791 Unspecified blood type, Rh negative: Secondary | ICD-10-CM | POA: Insufficient documentation

## 2022-02-23 DIAGNOSIS — M25511 Pain in right shoulder: Secondary | ICD-10-CM | POA: Insufficient documentation

## 2022-02-23 DIAGNOSIS — O10919 Unspecified pre-existing hypertension complicating pregnancy, unspecified trimester: Secondary | ICD-10-CM

## 2022-02-23 DIAGNOSIS — O09299 Supervision of pregnancy with other poor reproductive or obstetric history, unspecified trimester: Secondary | ICD-10-CM

## 2022-02-23 DIAGNOSIS — Z3A21 21 weeks gestation of pregnancy: Secondary | ICD-10-CM | POA: Diagnosis not present

## 2022-02-23 DIAGNOSIS — O10912 Unspecified pre-existing hypertension complicating pregnancy, second trimester: Secondary | ICD-10-CM | POA: Insufficient documentation

## 2022-02-23 DIAGNOSIS — R112 Nausea with vomiting, unspecified: Secondary | ICD-10-CM

## 2022-02-23 DIAGNOSIS — R809 Proteinuria, unspecified: Secondary | ICD-10-CM | POA: Diagnosis not present

## 2022-02-23 DIAGNOSIS — O0992 Supervision of high risk pregnancy, unspecified, second trimester: Secondary | ICD-10-CM | POA: Diagnosis not present

## 2022-02-23 DIAGNOSIS — M542 Cervicalgia: Secondary | ICD-10-CM | POA: Diagnosis not present

## 2022-02-23 DIAGNOSIS — O24012 Pre-existing diabetes mellitus, type 1, in pregnancy, second trimester: Secondary | ICD-10-CM | POA: Diagnosis not present

## 2022-02-23 DIAGNOSIS — O0990 Supervision of high risk pregnancy, unspecified, unspecified trimester: Secondary | ICD-10-CM

## 2022-02-23 DIAGNOSIS — O212 Late vomiting of pregnancy: Secondary | ICD-10-CM | POA: Diagnosis present

## 2022-02-23 DIAGNOSIS — E10319 Type 1 diabetes mellitus with unspecified diabetic retinopathy without macular edema: Secondary | ICD-10-CM

## 2022-02-23 DIAGNOSIS — H3581 Retinal edema: Secondary | ICD-10-CM | POA: Diagnosis not present

## 2022-02-23 DIAGNOSIS — M5489 Other dorsalgia: Secondary | ICD-10-CM | POA: Diagnosis not present

## 2022-02-23 DIAGNOSIS — M25512 Pain in left shoulder: Secondary | ICD-10-CM | POA: Diagnosis not present

## 2022-02-23 DIAGNOSIS — E10311 Type 1 diabetes mellitus with unspecified diabetic retinopathy with macular edema: Secondary | ICD-10-CM | POA: Diagnosis not present

## 2022-02-23 DIAGNOSIS — O26892 Other specified pregnancy related conditions, second trimester: Secondary | ICD-10-CM | POA: Diagnosis not present

## 2022-02-23 DIAGNOSIS — O1212 Gestational proteinuria, second trimester: Secondary | ICD-10-CM

## 2022-02-23 LAB — CBC
HCT: 45.1 % (ref 36.0–46.0)
Hemoglobin: 15.3 g/dL — ABNORMAL HIGH (ref 12.0–15.0)
MCH: 28.8 pg (ref 26.0–34.0)
MCHC: 33.9 g/dL (ref 30.0–36.0)
MCV: 84.8 fL (ref 80.0–100.0)
Platelets: 248 10*3/uL (ref 150–400)
RBC: 5.32 MIL/uL — ABNORMAL HIGH (ref 3.87–5.11)
RDW: 13.2 % (ref 11.5–15.5)
WBC: 3.8 10*3/uL — ABNORMAL LOW (ref 4.0–10.5)
nRBC: 0 % (ref 0.0–0.2)

## 2022-02-23 LAB — URINALYSIS, ROUTINE W REFLEX MICROSCOPIC
Bilirubin Urine: NEGATIVE
Glucose, UA: >=500 mg/dL — AB
Ketones, ur: 80 mg/dL — AB
Nitrite: NEGATIVE
Protein, ur: >=300 mg/dL — AB
Specific Gravity, Urine: 1.021 (ref 1.005–1.030)
pH: 5 (ref 5.0–8.0)

## 2022-02-23 LAB — COMPREHENSIVE METABOLIC PANEL
ALT: 7 U/L (ref 0–44)
AST: 11 U/L — ABNORMAL LOW (ref 15–41)
Albumin: 3.1 g/dL — ABNORMAL LOW (ref 3.5–5.0)
Alkaline Phosphatase: 76 U/L (ref 38–126)
Anion gap: 13 (ref 5–15)
BUN: 8 mg/dL (ref 6–20)
CO2: 21 mmol/L — ABNORMAL LOW (ref 22–32)
Calcium: 9 mg/dL (ref 8.9–10.3)
Chloride: 103 mmol/L (ref 98–111)
Creatinine, Ser: 0.62 mg/dL (ref 0.44–1.00)
GFR, Estimated: >60 mL/min (ref >60)
Glucose, Bld: 199 mg/dL — ABNORMAL HIGH (ref 70–99)
Potassium: 3.7 mmol/L (ref 3.5–5.1)
Sodium: 137 mmol/L (ref 135–145)
Total Bilirubin: 0.4 mg/dL (ref 0.3–1.2)
Total Protein: 7.4 g/dL (ref 6.5–8.1)

## 2022-02-23 LAB — GLUCOSE, CAPILLARY: Glucose-Capillary: 188 mg/dL — ABNORMAL HIGH (ref 70–99)

## 2022-02-23 LAB — BETA-HYDROXYBUTYRIC ACID: Beta-Hydroxybutyric Acid: 2.59 mmol/L — ABNORMAL HIGH (ref 0.05–0.27)

## 2022-02-23 MED ORDER — METOCLOPRAMIDE HCL 5 MG/ML IJ SOLN
10.0000 mg | Freq: Once | INTRAMUSCULAR | Status: AC
  Administered 2022-02-23: 10 mg via INTRAVENOUS
  Filled 2022-02-23: qty 2

## 2022-02-23 MED ORDER — METOCLOPRAMIDE HCL 10 MG PO TABS: 10.0000 mg | ORAL_TABLET | Freq: Three times a day (TID) | ORAL | 3 refills | Status: DC

## 2022-02-23 MED ORDER — SODIUM CHLORIDE 0.9 % IV SOLN
8.0000 mg | Freq: Once | INTRAVENOUS | Status: AC
  Administered 2022-02-23: 8 mg via INTRAVENOUS
  Filled 2022-02-23: qty 4

## 2022-02-23 MED ORDER — HYDROMORPHONE HCL 1 MG/ML IJ SOLN
0.5000 mg | Freq: Once | INTRAMUSCULAR | Status: AC
  Administered 2022-02-23: 0.5 mg via INTRAVENOUS
  Filled 2022-02-23: qty 1

## 2022-02-23 MED ORDER — LACTATED RINGERS IV BOLUS
1000.0000 mL | Freq: Once | INTRAVENOUS | Status: AC
  Administered 2022-02-23: 1000 mL via INTRAVENOUS

## 2022-02-23 MED ORDER — NIFEDIPINE 10 MG PO CAPS
20.0000 mg | ORAL_CAPSULE | Freq: Three times a day (TID) | ORAL | Status: DC
  Administered 2022-02-23: 20 mg via ORAL
  Filled 2022-02-23: qty 2

## 2022-02-23 MED ORDER — CYCLOBENZAPRINE HCL 10 MG PO TABS
5.0000 mg | ORAL_TABLET | Freq: Three times a day (TID) | ORAL | 2 refills | Status: DC | PRN
Start: 2022-02-23 — End: 2022-03-18

## 2022-02-23 NOTE — MAU Note (Signed)
Kristin Clay is a 36 y.o. at [redacted]w[redacted]d here in MAU reporting: she's been vomiting x4 days.  Reports unable to keep anything down.  States taking meds but meds not helping.  Also c/o bilateral shoulder pain ? ?Onset of complaint: 4 days ?Pain score: Shoulders - 10/10 & Neck 8/10 ?Vitals:  ? 02/23/22 1353  ?BP: (!) 151/94  ?Pulse: (!) 102  ?Resp: 20  ?Temp: 98.4 ?F (36.9 ?C)  ?SpO2: 100%  ?   ?FHT:151 bpm ?Lab orders placed from triage:    ?

## 2022-02-23 NOTE — MAU Provider Note (Addendum)
?History  ?  ? ?CSN: [id number] ? ?Arrival date and time: 02/23/22 1329 ? ? None  ?  ? ?Chief Complaint  ?Patient presents with  ? Nausea  ? ?HPI ?This is a 36 year old G3 P0-1-1-1 at 21 weeks and 3 days with a pregnancy complicated by chronic hypertension, type 1 diabetes with diabetic retinopathy, GBS bacteriuria, Rh-.  Patient seen for 4 days of nausea and vomiting.  She has been intolerant of oral food and liquids.  She has not been checking her blood sugars.  She has not taken any short acting insulin due to her not eating, but has been taking her long-acting insulin.  ? ?She also complains of bilateral upper back, neck, and shoulder pain.  Pain is constant and without radiation. No palliating or provoking factors. Pain is 8-10/10. ? ?OB History   ? ? Gravida  ?3  ? Para  ?1  ? Term  ?0  ? Preterm  ?1  ? AB  ?1  ? Living  ?1  ?  ? ? SAB  ?1  ? IAB  ?0  ? Ectopic  ?0  ? Multiple  ?0  ? Live Births  ?1  ?   ?  ?  ? ? ?Past Medical History:  ?Diagnosis Date  ? Chronic hypertension   ? Chronic hypertension with superimposed severe preeclampsia 06/23/2021  ? Complication of anesthesia   ? with hand surgery, local anes did not work, tried twice- had to put her to sleep  ? Diabetes mellitus type 1 ([place])   ? Diabetic retinopathy ([place])   ? DKA (diabetic ketoacidosis) ([place]) 10/2020  ? Epidural hematoma ([place]) 07/04/2021  ? GBS bacteriuria 06/09/2021  ? Obstetric vaginal laceration with type 3c third degree perineal laceration 06/29/2021  ? Poor fetal growth complicating pregnancy, antepartum, third trimester, other fetus 06/19/2021  ? Pyelonephritis 10/2020  ? Rh negative status during pregnancy 05/01/2021  ? s/p Rhogam 05/13/21  ? Type 1 diabetes mellitus with ketoacidosis, uncontrolled ([place]) 02/22/2020  ? Type I diabetes mellitus ([place])   ? dx 29yr ago  ? UTI (urinary tract infection)   ? ? ?Past Surgical History:  ?Procedure Laterality Date  ? female circumcision Bilateral   ? clitorectomy as well  ? FOOT SURGERY  2018  ?  HAND SURGERY  2019  ? ? ?Family History  ?Problem Relation Age of Onset  ? Hyperlipidemia Mother   ? Hypertension Mother   ? Kidney disease Father   ? Alzheimer's disease Father   ? ? ?Social History  ? ?Tobacco Use  ? Smoking status: Never  ? Smokeless tobacco: Never  ?Vaping Use  ? Vaping Use: Never used  ?Substance Use Topics  ? Alcohol use: Never  ? Drug use: Never  ? ? ?Allergies:  ?Allergies  ?Allergen Reactions  ? Pork-Derived Products   ?  Pt does not want any pork products  ? ? ?Medications Prior to Admission  ?Medication Sig Dispense Refill Last Dose  ? aspirin (ASPIRIN LOW DOSE) 81 MG chewable tablet CHEW 1 TABLET BY MOUTH DAILY. 90 tablet 1 02/23/2022  ? insulin glargine (LANTUS) 100 UNIT/ML Solostar Pen Inject 20-25 Units into the skin 2 (two) times daily. 20 in AM and 25 in PM 9 mL 11 02/22/2022  ? labetalol (NORMODYNE) 100 MG tablet Take 1 tablet (100 mg total) by mouth 2 (two) times daily. 180 tablet 3 02/23/2022  ? NIFEdipine (ADALAT CC) 90 MG 24 hr tablet Take 1 tablet (90 mg total) by  mouth daily. 90 tablet 3 02/23/2022  ? ondansetron (ZOFRAN-ODT) 4 MG disintegrating tablet Take 1 tablet (4 mg total) by mouth every 6 (six) hours as needed for nausea. 30 tablet 2 02/23/2022  ? Prenatal Vit-Fe Fumarate-FA (PRENATAL VITAMIN) 27-0.8 MG TABS Take 1 tablet by mouth daily. 90 tablet 2 02/22/2022  ? prochlorperazine (COMPAZINE) 10 MG tablet Take 1 tablet (10 mg total) by mouth 2 (two) times daily as needed for nausea or vomiting. 30 tablet 2 02/23/2022  ? scopolamine (TRANSDERM-SCOP) 1 MG/3DAYS Place 1 patch (1.5 mg total) onto the skin every 3 (three) days. 10 patch 12 02/23/2022  ? Accu-Chek Softclix Lancets lancets Use as instructed; check blood glucose 4 times daily 200 each 10   ? acetaminophen (TYLENOL) 500 MG tablet Take 1,000 mg by mouth every 6 (six) hours as needed.     ? Blood Glucose Monitoring Suppl (ACCU-CHEK GUIDE) w/Device KIT use as directed 4 (four) times daily. 1 kit 0   ? calcium carbonate (TUMS) 500  MG chewable tablet Chew 1 tablet (200 mg of elemental calcium total) by mouth 2 (two) times daily as needed for indigestion or heartburn. (Patient not taking: Reported on 02/09/2022) 30 tablet 0   ? Continuous Blood Gluc Transmit (DEXCOM G6 TRANSMITTER) MISC 1 Device by Does not apply route every 3 (three) months. 3 each 1   ? glucose blood (ACCU-CHEK GUIDE) test strip Use as instructed; check blood glucose 4 times daily 200 each 10   ? insulin aspart (NOVOLOG) 100 UNIT/ML FlexPen Inject 12 Units into the skin 3 (three) times daily with meals.   02/19/2022  ? metoCLOPramide (REGLAN) 10 MG tablet Take 1 tablet (10 mg total) by mouth 4 (four) times daily as needed for nausea or vomiting. 60 tablet 2   ? potassium chloride SA (KLOR-CON M) 20 MEQ tablet Take 2 tablets (40 mEq total) by mouth daily for 3 days. 6 tablet 0   ? promethazine (PHENERGAN) 25 MG suppository Place 1 suppository (25 mg total) rectally every 6 (six) hours as needed for nausea or vomiting. 12 each 0 02/19/2022  ? ? ?Review of Systems ?Physical Exam  ? ?Blood pressure (!) 151/94, pulse (!) 102, temperature 98.4 ?F (36.9 ?C), temperature source Oral, resp. rate 20, last menstrual period 09/20/2021, SpO2 100 %, unknown if currently breastfeeding. ? ?Physical Exam ?Vitals reviewed.  ?Constitutional:   ?   Appearance: Normal appearance.  ?Cardiovascular:  ?   Rate and Rhythm: Normal rate.  ?Pulmonary:  ?   Effort: Pulmonary effort is normal.  ?   Breath sounds: Normal breath sounds.  ?Abdominal:  ?   General: Abdomen is flat.  ?   Palpations: Abdomen is soft.  ?   Tenderness: There is abdominal tenderness (RLQ). There is no guarding or rebound.  ?Skin: ?   Capillary Refill: Capillary refill takes less than 2 seconds.  ?Neurological:  ?   General: No focal deficit present.  ?   Mental Status: She is alert.  ?Psychiatric:     ?   Mood and Affect: Mood normal.     ?   Behavior: Behavior normal.     ?   Thought Content: Thought content normal.     ?    Judgment: Judgment normal.  ? ? ?MAU Course  ?Procedures ? ?MDM ?1440 LR bolus x2L, check CBG, CMP, CBC, BHA. Will give zofran for nausea and vomiting, dilaudid for pain control. ?1615 Patient vomited - reglan ordered. Pain improved. ?1830 - patient  tolerating PO food and liquid (crackers and water). Nausea improved. Discharge to home. ? ?Assessment and Plan  ? ?1. Chronic hypertension affecting pregnancy   ?2. History of forceps delivery in prior pregnancy, currently pregnant   ?3. Supervision of high risk pregnancy, antepartum   ?4. Rh negative, antepartum   ?5. Proteinuria affecting pregnancy in second trimester   ?6. [redacted] weeks gestation of pregnancy   ?7. Type 1 diabetes mellitus with retinopathy, macular edema presence unspecified, unspecified laterality, unspecified retinopathy severity ([place])   ?8. Nausea and vomiting, unspecified vomiting type   ?9. Neck pain   ? ?Improved. No evidence of DKA. ?Discharge to home ?Reglan 42m AC. ?Flexeril 5-12mfor neck pain. ? ?JaTruett Mainland4/01/2022, 2:33 PM  ? ? ?Addendum: ?BP 160/96, then 179/97. Nifedipine 208miven. BP 137/63. Discharge to home and take night BP medications. ? ?JacTruett MainlandO ?02/23/2022 ?8:01 PM ? ?

## 2022-02-23 NOTE — Progress Notes (Shared)
?[facility] Note ? ?02/25/2022 ? ?  ? ?CHIEF COMPLAINT ?Patient presents for No chief complaint on file. ? ? ?HISTORY OF PRESENT ILLNESS: ?Kristin Clay is a 36 y.o. female who presents to the clinic today for:  ? ? ? ?Referring physician: ?Gevena Cotton MD ?[place], [place] [address] ? ?HISTORICAL INFORMATION:  ? ?Selected notes from the [facility] ?Referred by Dr. Frederico Hamman for diabetic retinal evaluation ?LEE:  ?Ocular Hx- ?PMH-pregnancy, DM ?  ? ?CURRENT MEDICATIONS: ?No current outpatient medications on file. (Ophthalmic Drugs)  ? ?No current facility-administered medications for this visit. (Ophthalmic Drugs)  ? ?Current Outpatient Medications (Other)  ?Medication Sig  ? Accu-Chek Softclix Lancets lancets Use as instructed; check blood glucose 4 times daily  ? acetaminophen (TYLENOL) 500 MG tablet Take 1,000 mg by mouth every 6 (six) hours as needed.  ? aspirin (ASPIRIN LOW DOSE) 81 MG chewable tablet CHEW 1 TABLET BY MOUTH DAILY.  ? Blood Glucose Monitoring Suppl (ACCU-CHEK GUIDE) w/Device KIT use as directed 4 (four) times daily.  ? calcium carbonate (TUMS) 500 MG chewable tablet Chew 1 tablet (200 mg of elemental calcium total) by mouth 2 (two) times daily as needed for indigestion or heartburn. (Patient not taking: Reported on 02/09/2022)  ? Continuous Blood Gluc Transmit (DEXCOM G6 TRANSMITTER) MISC 1 Device by Does not apply route every 3 (three) months.  ? glucose blood (ACCU-CHEK GUIDE) test strip Use as instructed; check blood glucose 4 times daily  ? insulin aspart (NOVOLOG) 100 UNIT/ML FlexPen Inject 12 Units into the skin 3 (three) times daily with meals.  ? insulin glargine (LANTUS) 100 UNIT/ML Solostar Pen Inject 20-25 Units into the skin 2 (two) times daily. 20 in AM and 25 in PM  ? labetalol (NORMODYNE) 100 MG tablet Take 1 tablet (100 mg total) by mouth 2 (two) times daily.  ? metoCLOPramide (REGLAN) 10 MG tablet Take 1  tablet (10 mg total) by mouth 4 (four) times daily as needed for nausea or vomiting.  ? NIFEdipine (ADALAT CC) 90 MG 24 hr tablet Take 1 tablet (90 mg total) by mouth daily.  ? ondansetron (ZOFRAN-ODT) 4 MG disintegrating tablet Take 1 tablet (4 mg total) by mouth every 6 (six) hours as needed for nausea.  ? potassium chloride SA (KLOR-CON M) 20 MEQ tablet Take 2 tablets (40 mEq total) by mouth daily for 3 days.  ? Prenatal Vit-Fe Fumarate-FA (PRENATAL VITAMIN) 27-0.8 MG TABS Take 1 tablet by mouth daily.  ? prochlorperazine (COMPAZINE) 10 MG tablet Take 1 tablet (10 mg total) by mouth 2 (two) times daily as needed for nausea or vomiting.  ? promethazine (PHENERGAN) 25 MG suppository Place 1 suppository (25 mg total) rectally every 6 (six) hours as needed for nausea or vomiting.  ? scopolamine (TRANSDERM-SCOP) 1 MG/3DAYS Place 1 patch (1.5 mg total) onto the skin every 3 (three) days.  ? ?No current facility-administered medications for this visit. (Other)  ? ?REVIEW OF SYSTEMS: ? ? ?ALLERGIES ?Allergies  ?Allergen Reactions  ? Pork-Derived Products   ?  Pt does not want any pork products  ? ?PAST MEDICAL HISTORY ?Past Medical History:  ?Diagnosis Date  ? Chronic hypertension   ? Chronic hypertension with superimposed severe preeclampsia 06/23/2021  ? Complication of anesthesia   ? with hand surgery, local anes did not work, tried twice- had to put her to sleep  ? Diabetes mellitus type 1 ([place])   ?  Diabetic retinopathy ([place])   ? DKA (diabetic ketoacidosis) ([place]) 10/2020  ? Epidural hematoma 07/04/2021  ? GBS bacteriuria 06/09/2021  ? Obstetric vaginal laceration with type 3c third degree perineal laceration 06/29/2021  ? Poor fetal growth complicating pregnancy, antepartum, third trimester, other fetus 06/19/2021  ? Pyelonephritis 10/2020  ? Rh negative status during pregnancy 05/01/2021  ? s/p Rhogam 05/13/21  ? Type 1 diabetes mellitus with ketoacidosis, uncontrolled ([place]) 02/22/2020  ? Type I diabetes mellitus  ([place])   ? dx 50yr ago  ? UTI (urinary tract infection)   ? ?Past Surgical History:  ?Procedure Laterality Date  ? female circumcision Bilateral   ? clitorectomy as well  ? FOOT SURGERY  2018  ? HAND SURGERY  2019  ? ?FAMILY HISTORY ?Family History  ?Problem Relation Age of Onset  ? Hyperlipidemia Mother   ? Hypertension Mother   ? Kidney disease Father   ? Alzheimer's disease Father   ? ?SOCIAL HISTORY ?Social History  ? ?Tobacco Use  ? Smoking status: Never  ? Smokeless tobacco: Never  ?Vaping Use  ? Vaping Use: Never used  ?Substance Use Topics  ? Alcohol use: Never  ? Drug use: Never  ?  ? ?  ?OPHTHALMIC EXAM: ? ?Not recorded ?  ? ? ?IMAGING AND PROCEDURES  ?Imaging and Procedures for 02/25/2022 ? ? ? ?  ?  ? ?  ?ASSESSMENT/PLAN: ? ?No diagnosis found. ? ?1,2. 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, both eyes ?- The incidence, risk factors for progression, natural history and treatment options for diabetic retinopathy  were discussed with patient.   ?- The need for close monitoring of blood glucose, blood pressure, and serum lipids, avoiding cigarette or any type of tobacco, and the need for long term follow up was also discussed with patient. ?- last A1c 10.4 on 01.23.23 ?- BCVA OD: 20/30 OS: 20/25 ?- exam shows early +NVD and +NVE OU -- will need PRP OU ?- OCT OD shows  Scattered IRF/IRHM, greatest inferior macula. Positive PRF/ERM overlying disc, OS shows Pre retinal fibrosis/ERM overlying disc extending to macula w/ mild traction ?- recommend PRP laser OU, OD-3.27.23, OS today, 04.05.23 ?- pt wishes to proceed with laser ?- RBA of procedure discussed, questions answered ?- informed consent obtained and signed ?- see procedure note  ?- will have pt return next week for PRP OS ?- f/u 02/25/2022 @ 3 pm ? ?3-5. High risk pregnancy w/ +HTN and hypertensive retinopathy ? -Epic lists her as 236w3d- pt w/ history of pre-eclampsia with prior pregnancy ?- discussed importance of tight BP control ?-  monitor ? ?Ophthalmic Meds Ordered this visit:  ?No orders of the defined types were placed in this encounter. ?  ? ?No follow-ups on file. ? ?There are no Patient Instructions on file for this visit. ? ?Explained the diagnoses, plan, and follow up with the patient and they expressed understanding.  Patient expressed understanding of the importance of proper follow up care.  ? ?This document serves as a record of services personally performed by BrGardiner SleeperMD, PhD. It was created on their behalf by MaOrvan Falconeran ophthalmic technician. The creation of this record is the provider's dictation and/or activities during the visit.   ? ?Electronically signed by: MaOrvan FalconerOA, 02/23/22  12:05 PM ? ? ?BrGardiner SleeperM.D., Ph.D. ?Diseases & Surgery of the Retina and Vitreous ?[facility] ?I have reviewed the above documentation for accuracy and completeness,  and I agree with the above. Gardiner Sleeper, M.D., Ph.D. 02/16/22 12:05 PM ? ?Abbreviations: ?M myopia (nearsighted); A astigmatism; H hyperopia (farsighted); P presbyopia; Mrx spectacle prescription;  CTL contact lenses; OD right eye; OS left eye; OU both eyes  XT exotropia; ET esotropia; PEK punctate epithelial keratitis; PEE punctate epithelial erosions; DES dry eye syndrome; MGD meibomian gland dysfunction; ATs artificial tears; PFAT's preservative free artificial tears; [place] nuclear sclerotic cataract; PSC posterior subcapsular cataract; ERM epi-retinal membrane; PVD posterior vitreous detachment; RD retinal detachment; DM diabetes mellitus; DR diabetic retinopathy; NPDR non-proliferative diabetic retinopathy; PDR proliferative diabetic retinopathy; CSME clinically significant macular edema; DME diabetic macular edema; dbh dot blot hemorrhages; CWS cotton wool spot; POAG primary open angle glaucoma; C/D cup-to-disc ratio; HVF humphrey visual field; GVF goldmann visual field; OCT optical coherence tomography; IOP  intraocular pressure; BRVO Branch retinal vein occlusion; CRVO central retinal vein occlusion; CRAO central retinal artery occlusion; BRAO branch retinal artery occlusion; RT retinal tear; SB scleral buckle; PPV pars pla

## 2022-02-25 ENCOUNTER — Encounter (INDEPENDENT_AMBULATORY_CARE_PROVIDER_SITE_OTHER): Payer: Medicaid Other | Admitting: Ophthalmology

## 2022-02-25 DIAGNOSIS — O0992 Supervision of high risk pregnancy, unspecified, second trimester: Secondary | ICD-10-CM

## 2022-02-25 DIAGNOSIS — H35033 Hypertensive retinopathy, bilateral: Secondary | ICD-10-CM

## 2022-02-25 DIAGNOSIS — E113513 Type 2 diabetes mellitus with proliferative diabetic retinopathy with macular edema, bilateral: Secondary | ICD-10-CM

## 2022-02-25 DIAGNOSIS — I10 Essential (primary) hypertension: Secondary | ICD-10-CM

## 2022-02-25 DIAGNOSIS — H35373 Puckering of macula, bilateral: Secondary | ICD-10-CM

## 2022-02-26 ENCOUNTER — Other Ambulatory Visit: Payer: Self-pay | Admitting: Obstetrics and Gynecology

## 2022-02-26 ENCOUNTER — Encounter (HOSPITAL_COMMUNITY): Payer: Self-pay | Admitting: Family Medicine

## 2022-02-26 ENCOUNTER — Inpatient Hospital Stay (HOSPITAL_COMMUNITY)
Admission: AD | Admit: 2022-02-26 | Discharge: 2022-03-05 | DRG: 831 | Disposition: A | Discharge status: Home / Self Care | Payer: Medicaid Other | Attending: Obstetrics & Gynecology | Admitting: Obstetrics & Gynecology

## 2022-02-26 ENCOUNTER — Other Ambulatory Visit: Payer: Self-pay

## 2022-02-26 DIAGNOSIS — O10919 Unspecified pre-existing hypertension complicating pregnancy, unspecified trimester: Secondary | ICD-10-CM | POA: Diagnosis not present

## 2022-02-26 DIAGNOSIS — O99282 Endocrine, nutritional and metabolic diseases complicating pregnancy, second trimester: Secondary | ICD-10-CM | POA: Diagnosis present

## 2022-02-26 DIAGNOSIS — E1010 Type 1 diabetes mellitus with ketoacidosis without coma: Secondary | ICD-10-CM | POA: Diagnosis not present

## 2022-02-26 DIAGNOSIS — O24012 Pre-existing diabetes mellitus, type 1, in pregnancy, second trimester: Secondary | ICD-10-CM | POA: Diagnosis not present

## 2022-02-26 DIAGNOSIS — D563 Thalassemia minor: Secondary | ICD-10-CM | POA: Diagnosis not present

## 2022-02-26 DIAGNOSIS — E876 Hypokalemia: Secondary | ICD-10-CM | POA: Diagnosis not present

## 2022-02-26 DIAGNOSIS — O212 Late vomiting of pregnancy: Secondary | ICD-10-CM | POA: Diagnosis present

## 2022-02-26 DIAGNOSIS — O2692 Pregnancy related conditions, unspecified, second trimester: Secondary | ICD-10-CM | POA: Diagnosis not present

## 2022-02-26 DIAGNOSIS — O10012 Pre-existing essential hypertension complicating pregnancy, second trimester: Secondary | ICD-10-CM | POA: Diagnosis not present

## 2022-02-26 DIAGNOSIS — Z3A21 21 weeks gestation of pregnancy: Secondary | ICD-10-CM

## 2022-02-26 DIAGNOSIS — O26892 Other specified pregnancy related conditions, second trimester: Secondary | ICD-10-CM | POA: Diagnosis present

## 2022-02-26 DIAGNOSIS — Z7189 Other specified counseling: Secondary | ICD-10-CM | POA: Insufficient documentation

## 2022-02-26 DIAGNOSIS — O09522 Supervision of elderly multigravida, second trimester: Secondary | ICD-10-CM

## 2022-02-26 DIAGNOSIS — Z794 Long term (current) use of insulin: Secondary | ICD-10-CM | POA: Diagnosis not present

## 2022-02-26 DIAGNOSIS — Z789 Other specified health status: Secondary | ICD-10-CM | POA: Diagnosis present

## 2022-02-26 DIAGNOSIS — Z6791 Unspecified blood type, Rh negative: Secondary | ICD-10-CM | POA: Diagnosis not present

## 2022-02-26 DIAGNOSIS — O24013 Pre-existing diabetes mellitus, type 1, in pregnancy, third trimester: Secondary | ICD-10-CM | POA: Diagnosis present

## 2022-02-26 DIAGNOSIS — O26899 Other specified pregnancy related conditions, unspecified trimester: Secondary | ICD-10-CM

## 2022-02-26 DIAGNOSIS — O1212 Gestational proteinuria, second trimester: Secondary | ICD-10-CM

## 2022-02-26 LAB — BASIC METABOLIC PANEL
Anion gap: 9 (ref 5–15)
BUN: 8 mg/dL (ref 6–20)
CO2: 14 mmol/L — ABNORMAL LOW (ref 22–32)
Calcium: 7.9 mg/dL — ABNORMAL LOW (ref 8.9–10.3)
Chloride: 119 mmol/L — ABNORMAL HIGH (ref 98–111)
Creatinine, Ser: 0.44 mg/dL (ref 0.44–1.00)
GFR, Estimated: >60 mL/min (ref >60)
Glucose, Bld: 215 mg/dL — ABNORMAL HIGH (ref 70–99)
Potassium: 3.4 mmol/L — ABNORMAL LOW (ref 3.5–5.1)
Sodium: 142 mmol/L (ref 135–145)

## 2022-02-26 LAB — COMPREHENSIVE METABOLIC PANEL
ALT: 7 U/L (ref 0–44)
AST: 7 U/L — ABNORMAL LOW (ref 15–41)
Albumin: 2.9 g/dL — ABNORMAL LOW (ref 3.5–5.0)
Alkaline Phosphatase: 67 U/L (ref 38–126)
Anion gap: 14 (ref 5–15)
BUN: 9 mg/dL (ref 6–20)
CO2: 12 mmol/L — ABNORMAL LOW (ref 22–32)
Calcium: 8.5 mg/dL — ABNORMAL LOW (ref 8.9–10.3)
Chloride: 114 mmol/L — ABNORMAL HIGH (ref 98–111)
Creatinine, Ser: 0.62 mg/dL (ref 0.44–1.00)
GFR, Estimated: >60 mL/min (ref >60)
Glucose, Bld: 301 mg/dL — ABNORMAL HIGH (ref 70–99)
Potassium: 3 mmol/L — ABNORMAL LOW (ref 3.5–5.1)
Sodium: 140 mmol/L (ref 135–145)
Total Bilirubin: 1 mg/dL (ref 0.3–1.2)
Total Protein: 6.6 g/dL (ref 6.5–8.1)

## 2022-02-26 LAB — GLUCOSE, CAPILLARY
Glucose-Capillary: 292 mg/dL — ABNORMAL HIGH (ref 70–99)
Glucose-Capillary: 227 mg/dL — ABNORMAL HIGH (ref 70–99)
Glucose-Capillary: 124 mg/dL — ABNORMAL HIGH (ref 70–99)
Glucose-Capillary: 111 mg/dL — ABNORMAL HIGH (ref 70–99)
Glucose-Capillary: 107 mg/dL — ABNORMAL HIGH (ref 70–99)
Glucose-Capillary: 133 mg/dL — ABNORMAL HIGH (ref 70–99)
Glucose-Capillary: 186 mg/dL — ABNORMAL HIGH (ref 70–99)
Glucose-Capillary: 169 mg/dL — ABNORMAL HIGH (ref 70–99)
Glucose-Capillary: 177 mg/dL — ABNORMAL HIGH (ref 70–99)
Glucose-Capillary: 183 mg/dL — ABNORMAL HIGH (ref 70–99)

## 2022-02-26 LAB — HEMOGLOBIN A1C
Hgb A1c MFr Bld: 8.4 % — ABNORMAL HIGH (ref 4.8–5.6)
Mean Plasma Glucose: 194.38 mg/dL

## 2022-02-26 LAB — URINALYSIS, ROUTINE W REFLEX MICROSCOPIC
Bilirubin Urine: NEGATIVE
Glucose, UA: >=500 mg/dL — AB
Ketones, ur: 80 mg/dL — AB
Leukocytes,Ua: NEGATIVE
Nitrite: NEGATIVE
Protein, ur: >=300 mg/dL — AB
Specific Gravity, Urine: 1.026 (ref 1.005–1.030)
pH: 5 (ref 5.0–8.0)

## 2022-02-26 LAB — CBC
HCT: 46.1 % — ABNORMAL HIGH (ref 36.0–46.0)
Hemoglobin: 15.4 g/dL — ABNORMAL HIGH (ref 12.0–15.0)
MCH: 28.5 pg (ref 26.0–34.0)
MCHC: 33.4 g/dL (ref 30.0–36.0)
MCV: 85.4 fL (ref 80.0–100.0)
Platelets: 271 10*3/uL (ref 150–400)
RBC: 5.4 MIL/uL — ABNORMAL HIGH (ref 3.87–5.11)
RDW: 13.7 % (ref 11.5–15.5)
WBC: 5.6 10*3/uL (ref 4.0–10.5)
nRBC: 0 % (ref 0.0–0.2)

## 2022-02-26 LAB — AMYLASE: Amylase: 53 U/L (ref 28–100)

## 2022-02-26 LAB — LIPASE, BLOOD: Lipase: 29 U/L (ref 11–51)

## 2022-02-26 LAB — BETA-HYDROXYBUTYRIC ACID: Beta-Hydroxybutyric Acid: 5.99 mmol/L — ABNORMAL HIGH (ref 0.05–0.27)

## 2022-02-26 LAB — PROTEIN / CREATININE RATIO, URINE
Creatinine, Urine: 78.95 mg/dL
Protein Creatinine Ratio: 2.33 mg/mg{Cre} — ABNORMAL HIGH (ref 0.00–0.15)
Total Protein, Urine: 184 mg/dL

## 2022-02-26 MED ORDER — SODIUM CHLORIDE 0.9 % IV BOLUS
1000.0000 mL | Freq: Once | INTRAVENOUS | Status: AC
  Administered 2022-02-26: 1000 mL via INTRAVENOUS

## 2022-02-26 MED ORDER — ASPIRIN 81 MG PO CHEW: 162.0000 mg | CHEWABLE_TABLET | Freq: Every day | ORAL | Status: DC

## 2022-02-26 MED ORDER — SODIUM CHLORIDE 0.9% FLUSH: 3.0000 mL | INTRAVENOUS | Status: DC | PRN

## 2022-02-26 MED ORDER — LACTATED RINGERS IV SOLN: INTRAVENOUS | Status: DC

## 2022-02-26 MED ORDER — DOCUSATE SODIUM 100 MG PO CAPS
100.0000 mg | ORAL_CAPSULE | Freq: Every day | ORAL | Status: DC
  Administered 2022-02-28 – 2022-03-05 (×6): 100 mg via ORAL
  Filled 2022-02-26 (×7): qty 1

## 2022-02-26 MED ORDER — SODIUM CHLORIDE 0.9 % IV SOLN
25.0000 mg | Freq: Four times a day (QID) | INTRAVENOUS | Status: DC | PRN
  Administered 2022-02-27: 25 mg via INTRAVENOUS
  Filled 2022-02-26: qty 1

## 2022-02-26 MED ORDER — INSULIN REGULAR(HUMAN) IN NACL 100-0.9 UT/100ML-% IV SOLN
INTRAVENOUS | Status: DC
  Administered 2022-02-26: 1 [IU]/h via INTRAVENOUS
  Filled 2022-02-26 (×2): qty 100

## 2022-02-26 MED ORDER — PRENATAL MULTIVITAMIN CH
1.0000 | ORAL_TABLET | Freq: Every day | ORAL | Status: DC
  Administered 2022-02-28 – 2022-03-04 (×5): 1 via ORAL
  Filled 2022-02-26 (×5): qty 1

## 2022-02-26 MED ORDER — PROCHLORPERAZINE MALEATE 10 MG PO TABS
10.0000 mg | ORAL_TABLET | Freq: Two times a day (BID) | ORAL | Status: DC | PRN
  Administered 2022-02-26 – 2022-02-27 (×2): 10 mg via ORAL
  Filled 2022-02-26 (×4): qty 1

## 2022-02-26 MED ORDER — LABETALOL HCL 5 MG/ML IV SOLN
20.0000 mg | INTRAVENOUS | Status: DC | PRN
  Administered 2022-02-26 – 2022-02-27 (×2): 20 mg via INTRAVENOUS
  Filled 2022-02-26 (×2): qty 4

## 2022-02-26 MED ORDER — SCOPOLAMINE 1 MG/3DAYS TD PT72
1.0000 | MEDICATED_PATCH | TRANSDERMAL | Status: DC
  Administered 2022-02-26 – 2022-03-01 (×2): 1.5 mg via TRANSDERMAL
  Filled 2022-02-26 (×2): qty 1

## 2022-02-26 MED ORDER — FAMOTIDINE IN NACL 20-0.9 MG/50ML-% IV SOLN
20.0000 mg | Freq: Once | INTRAVENOUS | Status: AC
  Administered 2022-02-26: 20 mg via INTRAVENOUS
  Filled 2022-02-26: qty 50

## 2022-02-26 MED ORDER — SODIUM CHLORIDE 0.9 % IV SOLN: 250.0000 mL | INTRAVENOUS | Status: DC | PRN

## 2022-02-26 MED ORDER — SODIUM CHLORIDE 0.9% FLUSH
3.0000 mL | Freq: Two times a day (BID) | INTRAVENOUS | Status: DC
  Administered 2022-02-27 – 2022-02-28 (×4): 3 mL via INTRAVENOUS

## 2022-02-26 MED ORDER — LABETALOL HCL 5 MG/ML IV SOLN
20.0000 mg | INTRAVENOUS | Status: DC | PRN
  Administered 2022-02-26: 20 mg via INTRAVENOUS
  Filled 2022-02-26 (×2): qty 4

## 2022-02-26 MED ORDER — LABETALOL HCL 5 MG/ML IV SOLN
40.0000 mg | INTRAVENOUS | Status: DC | PRN
  Administered 2022-02-27: 40 mg via INTRAVENOUS
  Filled 2022-02-26: qty 8

## 2022-02-26 MED ORDER — LABETALOL HCL 5 MG/ML IV SOLN: 80.0000 mg | INTRAVENOUS | Status: DC | PRN

## 2022-02-26 MED ORDER — LABETALOL HCL 100 MG PO TABS
100.0000 mg | ORAL_TABLET | Freq: Two times a day (BID) | ORAL | Status: DC
  Administered 2022-02-26 – 2022-02-27 (×3): 100 mg via ORAL
  Filled 2022-02-26 (×3): qty 1

## 2022-02-26 MED ORDER — DIPHENHYDRAMINE HCL 50 MG/ML IJ SOLN
25.0000 mg | Freq: Once | INTRAMUSCULAR | Status: AC
  Administered 2022-02-26: 25 mg via INTRAVENOUS
  Filled 2022-02-26: qty 1

## 2022-02-26 MED ORDER — ACETAMINOPHEN 325 MG PO TABS
650.0000 mg | ORAL_TABLET | ORAL | Status: DC | PRN
  Administered 2022-03-02: 650 mg via ORAL
  Filled 2022-02-26: qty 2

## 2022-02-26 MED ORDER — LACTATED RINGERS IV BOLUS
20.0000 mL/kg | Freq: Once | INTRAVENOUS | Status: AC
  Administered 2022-02-26: 954 mL via INTRAVENOUS

## 2022-02-26 MED ORDER — CALCIUM CARBONATE ANTACID 500 MG PO CHEW: 2.0000 | CHEWABLE_TABLET | ORAL | Status: DC | PRN

## 2022-02-26 MED ORDER — HYDRALAZINE HCL 20 MG/ML IJ SOLN: 10.0000 mg | INTRAMUSCULAR | Status: DC | PRN

## 2022-02-26 MED ORDER — NIFEDIPINE ER OSMOTIC RELEASE 60 MG PO TB24
90.0000 mg | ORAL_TABLET | Freq: Every day | ORAL | Status: DC
Start: 2022-02-27 — End: 2022-03-05
  Administered 2022-02-27 – 2022-03-05 (×7): 90 mg via ORAL
  Filled 2022-02-26 (×7): qty 1

## 2022-02-26 MED ORDER — METOCLOPRAMIDE HCL 5 MG/ML IJ SOLN
10.0000 mg | Freq: Once | INTRAMUSCULAR | Status: AC
  Administered 2022-02-26: 10 mg via INTRAVENOUS
  Filled 2022-02-26: qty 2

## 2022-02-26 MED ORDER — METOCLOPRAMIDE HCL 10 MG PO TABS
10.0000 mg | ORAL_TABLET | Freq: Three times a day (TID) | ORAL | Status: DC
Start: 2022-02-26 — End: 2022-03-04
  Administered 2022-02-27 – 2022-03-04 (×16): 10 mg via ORAL
  Filled 2022-02-26 (×16): qty 1

## 2022-02-26 MED ORDER — ASPIRIN 81 MG PO CHEW
162.0000 mg | CHEWABLE_TABLET | Freq: Every day | ORAL | Status: DC
  Administered 2022-02-27 – 2022-03-05 (×7): 162 mg via ORAL
  Filled 2022-02-26 (×7): qty 2

## 2022-02-26 MED ORDER — ONDANSETRON HCL 4 MG/2ML IJ SOLN
4.0000 mg | Freq: Once | INTRAMUSCULAR | Status: AC
Start: 2022-02-26 — End: 2022-02-26
  Administered 2022-02-26: 4 mg via INTRAVENOUS
  Filled 2022-02-26: qty 2

## 2022-02-26 MED ORDER — LACTATED RINGERS IV BOLUS
1000.0000 mL | Freq: Once | INTRAVENOUS | Status: AC
  Administered 2022-02-26: 1000 mL via INTRAVENOUS

## 2022-02-26 MED ORDER — ZOLPIDEM TARTRATE 5 MG PO TABS: 5.0000 mg | ORAL_TABLET | Freq: Every evening | ORAL | Status: DC | PRN

## 2022-02-26 MED ORDER — DEXTROSE IN LACTATED RINGERS 5 % IV SOLN: INTRAVENOUS | Status: DC

## 2022-02-26 MED ORDER — DEXTROSE 50 % IV SOLN: 0.0000 mL | INTRAVENOUS | Status: DC | PRN

## 2022-02-26 MED ORDER — POTASSIUM CHLORIDE 10 MEQ/100ML IV SOLN
10.0000 meq | INTRAVENOUS | Status: AC
  Administered 2022-02-26 (×4): 10 meq via INTRAVENOUS
  Filled 2022-02-26 (×4): qty 100

## 2022-02-26 MED ORDER — INSULIN ASPART 100 UNIT/ML IJ SOLN
14.0000 [IU] | Freq: Once | INTRAMUSCULAR | Status: AC
  Administered 2022-02-26: 14 [IU] via SUBCUTANEOUS

## 2022-02-26 MED ORDER — ONDANSETRON 4 MG PO TBDP
4.0000 mg | ORAL_TABLET | Freq: Four times a day (QID) | ORAL | Status: DC | PRN
  Administered 2022-02-26: 4 mg via ORAL
  Filled 2022-02-26: qty 1

## 2022-02-26 MED ORDER — LABETALOL HCL 5 MG/ML IV SOLN: 40.0000 mg | INTRAVENOUS | Status: DC | PRN

## 2022-02-26 NOTE — H&P (Signed)
[facility]TE[facility] ADMISSION HISTORY AND PHYSICAL NOTE ? ? ?History of Present Illness: ?Kristin Clay is a 36 y.o. [phone] at 59w6dby 18 week ultrasound with PMHx notable for DKA, IDDM, & chronic hypertension being admitted for DKA.   ?Reports frontal headache x 3 days. Nothing makes better or worse. Hasn't taken anything for her symptoms. Associated symptoms include n/v, blurred vision, and epigastric pain. Took blood pressure medication this morning but immediately vomited. Also took antiemetic this morning but didn't keep it down. States she's vomited more than 10 times today.  ?Hasn't checked her blood sugar or taken insulin in over 3 days - states she's felt too weak to take her insulin. Endorses mid epigastric burning.  ?Denies abdominal pain, vaginal bleeding, fever, or LOF.  ? ?Patient reports the fetal movement as active. ?Patient reports uterine contraction  activity as none. ?Patient reports  vaginal bleeding as none. ?Patient describes fluid per vagina as None. ? ? ?Patient Active Problem List  ? Diagnosis Date Noted  ? Red Chart Rounds 02/26/2022  ? Hypokalemia 01/06/2022  ? Alpha thalassemia silent carrier 01/05/2022  ? DKA, type 1 ([place] 01/05/2022  ? Atypical chest pain 01/04/2022  ? Blurry vision 01/04/2022  ? DM type 1 causing eye disease ([place] 01/04/2022  ? Shortness of breath 01/04/2022  ? Proteinuria affecting pregnancy 12/16/2021  ? Chronic hypertension affecting pregnancy 12/15/2021  ? Short interval between pregnancies affecting pregnancy, antepartum 12/15/2021  ? History of severe preeclampsia, prior pregnancy, currently pregnant 12/15/2021  ? Previous preterm delivery at 33w6 due to severe preeclampsia, antepartum 12/15/2021  ? History of forceps delivery in prior pregnancy, currently pregnant 12/15/2021  ? Type 1 diabetes mellitus affecting pregnancy in second trimester, antepartum 12/11/2021  ? Supervision of high risk pregnancy, antepartum 11/27/2021  ? Rh negative,  antepartum 11/27/2021  ? Poor compliance 08/04/2021  ? Hypertension 08/04/2021  ? Language barrier 01/11/2020  ? 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 ? ?Past Medical History:  ?Diagnosis Date  ? Chronic hypertension   ? Complication of anesthesia   ? with hand surgery, local anes did not work, tried twice- had to put her to sleep  ? Diabetic retinopathy ([place]   ? DKA (diabetic ketoacidosis) ([place] 10/2020  ? Epidural hematoma ([place] 07/04/2021  ? Obstetric vaginal laceration with type 3c third degree perineal laceration 06/29/2021  ? Pyelonephritis 10/2020  ? Rh negative status during pregnancy 05/01/2021  ? s/p Rhogam 05/13/21  ? Type I diabetes mellitus ([place]   ? dx 157yrago  ? UTI (urinary tract infection)   ? ? ?Past Surgical History:  ?Procedure Laterality Date  ? female circumcision Bilateral   ? clitorectomy as well  ? FOOT SURGERY  2018  ? HAND SURGERY  2019  ? ? ?OB History  ?Gravida Para Term Preterm AB Living  ?3 1 0 _0 ?SAB IAB Ectopic Multiple Live Births  ?1 0 0 0 1  ?  ?# Outcome Date GA Lbr Len/2nd Weight Sex Delivery Anes PTL Lv  ?3 Current           ?2 Preterm 06/29/21 [id number] 1860 g F Vag-Forceps EPI  LIV  ?   Birth Comments: WDL  ?1 SAB 12/2019          ? ? ?Social History  ? ?Socioeconomic History  ? Marital status: Married  ?  Spouse name: Not on file  ? Number of children: Not on file  ? Years of education:  Not on file  ? Highest education level: Not on file  ?Occupational History  ? Not on file  ?Tobacco Use  ? Smoking status: Never  ? Smokeless tobacco: Never  ?Vaping Use  ? Vaping Use: Never used  ?Substance and Sexual Activity  ? Alcohol use: Never  ? Drug use: Never  ? Sexual activity: Yes  ?  Birth control/protection: None  ?Other Topics Concern  ? Not on file  ?Social History Narrative  ? ** Merged History Encounter **  ?    ? ** Merged History Encounter **  ?    ? ?Social Determinants of Health  ? ?Financial Resource Strain: High Risk  ? Difficulty of Paying Living Expenses: Very  hard  ?Food Insecurity: No Food Insecurity  ? Worried About Charity fundraiser in the Last Year: Never true  ? Ran Out of Food in the Last Year: Never true  ?Transportation Needs: No Transportation Needs  ? Lack of Transportation (Medical): No  ? Lack of Transportation (Non-Medical): No  ?Physical Activity: Not on file  ?Stress: Not on file  ?Social Connections: Not on file  ? ? ?Family History  ?Problem Relation Age of Onset  ? Hyperlipidemia Mother   ? Hypertension Mother   ? Kidney disease Father   ? Alzheimer's disease Father   ? ? ?Allergies  ?Allergen Reactions  ? Pork-Derived Products   ?  Pt does not want any pork products  ? ? ?Medications Prior to Admission  ?Medication Sig Dispense Refill Last Dose  ? aspirin (ASPIRIN LOW DOSE) 81 MG chewable tablet CHEW 1 TABLET BY MOUTH DAILY. 90 tablet 1 02/26/2022  ? labetalol (NORMODYNE) 100 MG tablet Take 1 tablet (100 mg total) by mouth 2 (two) times daily. 180 tablet 3 02/26/2022 at 1030  ? metoCLOPramide (REGLAN) 10 MG tablet Take 1 tablet (10 mg total) by mouth 3 (three) times daily before meals. 90 tablet 3 02/26/2022 at 1000  ? NIFEdipine (ADALAT CC) 90 MG 24 hr tablet Take 1 tablet (90 mg total) by mouth daily. 90 tablet 3 02/26/2022  ? ondansetron (ZOFRAN-ODT) 4 MG disintegrating tablet Take 1 tablet (4 mg total) by mouth every 6 (six) hours as needed for nausea. 30 tablet 2 02/26/2022 at 0930  ? prochlorperazine (COMPAZINE) 10 MG tablet Take 1 tablet (10 mg total) by mouth 2 (two) times daily as needed for nausea or vomiting. 30 tablet 2 02/26/2022  ? scopolamine (TRANSDERM-SCOP) 1 MG/3DAYS Place 1 patch (1.5 mg total) onto the skin every 3 (three) days. 10 patch 12 Past Month  ? Accu-Chek Softclix Lancets lancets Use as instructed; check blood glucose 4 times daily 200 each 10   ? acetaminophen (TYLENOL) 500 MG tablet Take 1,000 mg by mouth every 6 (six) hours as needed.     ? Blood Glucose Monitoring Suppl (ACCU-CHEK GUIDE) w/Device KIT use as directed 4 (four)  times daily. 1 kit 0   ? calcium carbonate (TUMS) 500 MG chewable tablet Chew 1 tablet (200 mg of elemental calcium total) by mouth 2 (two) times daily as needed for indigestion or heartburn. (Patient not taking: Reported on 02/09/2022) 30 tablet 0   ? Continuous Blood Gluc Transmit (DEXCOM G6 TRANSMITTER) MISC 1 Device by Does not apply route every 3 (three) months. 3 each 1   ? cyclobenzaprine (FLEXERIL) 10 MG tablet Take 0.5-1 tablets (5-10 mg total) by mouth 3 (three) times daily as needed for muscle spasms. 30 tablet 2   ? glucose blood (  ACCU-CHEK GUIDE) test strip Use as instructed; check blood glucose 4 times daily 200 each 10   ? insulin aspart (NOVOLOG) 100 UNIT/ML FlexPen Inject 12 Units into the skin 3 (three) times daily with meals.     ? insulin glargine (LANTUS) 100 UNIT/ML Solostar Pen Inject 20-25 Units into the skin 2 (two) times daily. 20 in AM and 25 in PM 9 mL 11   ? potassium chloride SA (KLOR-CON M) 20 MEQ tablet Take 2 tablets (40 mEq total) by mouth daily for 3 days. 6 tablet 0   ? Prenatal Vit-Fe Fumarate-FA (PRENATAL VITAMIN) 27-0.8 MG TABS Take 1 tablet by mouth daily. 90 tablet 2   ? promethazine (PHENERGAN) 25 MG suppository Place 1 suppository (25 mg total) rectally every 6 (six) hours as needed for nausea or vomiting. 12 each 0   ? ? ?Review of Systems - History obtained from chart review, the patient (via video Arabic interpreter) ? ?Vitals:  BP 136/80   Pulse (!) 105   Temp 98.4 ?F (36.9 ?C) (Oral)   Resp 20   Ht _0  (1.575 m)   Wt 47.7 kg   LMP 09/20/2021   SpO2 100%   BMI 19.24 kg/m?  ?Physical Examination: ?CONSTITUTIONAL: Well-developed, well-nourished female in no acute distress.  ?HENT:  Normocephalic, atraumatic, External right and left ear normal. Oropharynx is clear and moist ?EYES: Conjunctivae and EOM are normal. Pupils are equal, round, and reactive to light. No scleral icterus.  ?NECK: Normal range of motion, supple, no masses ?SKIN: Skin is warm and dry. No  rash noted. Not diaphoretic. No erythema. No pallor. ?[place]: Alert and oriented to person, place, and time. Normal reflexes, muscle tone coordination. No cranial nerve deficit noted. ?PSYCHIATRIC: Normal mood a

## 2022-02-26 NOTE — MAU Note (Addendum)
...  Kristin Clay is a 36 y.o. at [redacted]w[redacted]d here in MAU reporting: Constant nausea and vomiting. She states she has taken all of her medications this morning and has not had any relief. She states she has been drinking water all morning after vomiting. She is also endorsing constant burning in her upper abdomen that she rates a 10/10. Last tried to eat one hour ago. Has not taken insulin or checked her blood sugar in the past three days. HA for two days. No VB or LOF.  ? ?Pain score:  ?10/10 upper abdomen ?9/10 HA ? ?3/29 pt weighed 53.5 kg ?Today 47.7 kg ? ?FHT: 155 doppler ?Lab orders placed from triage: UA ? ?

## 2022-02-26 NOTE — [facility] Provider Note (Signed)
?History  ?  ? ?[id number] ? ?Arrival date and time: 02/26/22 1225 ?  ? ?Chief Complaint  ?Patient presents with  ? Nausea  ? Emesis  ? ? ? ?HPI ?Kristin Clay is a 36 y.o. at 58w6dby 18 week ultrasound with PMHx notable for DKA, IDDM, & chronic hypertension, who presents for headache & nausea/vomiting. ?Reports frontal headache x 3 days. Nothing makes better or worse. Hasn't taken anything for her symptoms. Associated symptoms include n/v, blurred vision, and epigastric pain. Took blood pressure medication this morning but immediately vomited. Also took antiemetic this morning but didn't keep it down. States she's vomited more than 10 times today.  ?Hasn't checked her blood sugar or taken insulin in over 3 days - states she's felt too weak to take her insulin.  ?Denies abdominal pain, vaginal bleeding, fever, or LOF.   ? ?OB History   ? ? Gravida  ?3  ? Para  ?1  ? Term  ?0  ? Preterm  ?1  ? AB  ?1  ? Living  ?1  ?  ? ? SAB  ?1  ? IAB  ?0  ? Ectopic  ?0  ? Multiple  ?0  ? Live Births  ?1  ?   ?  ?  ? ? ?Past Medical History:  ?Diagnosis Date  ? Chronic hypertension   ? Complication of anesthesia   ? with hand surgery, local anes did not work, tried twice- had to put her to sleep  ? [place]   ? [place]  ? [place] [place]  ? Obstetric vaginal laceration with type 3c third degree perineal laceration 06/29/2021  ? Pyelonephritis 10/2020  ? Rh negative status during pregnancy 05/01/2021  ? s/p Rhogam 05/13/21  ? Type I diabetes mellitus ([facility]   ? dx 110yrago  ? UTI (urinary tract infection)   ? ? ?Past Surgical History:  ?Procedure Laterality Date  ? female circumcision Bilateral   ? clitorectomy as well  ? FOOT SURGERY  2018  ? HAND SURGERY  2019  ? ? ?Family History  ?Problem Relation Age of Onset  ? Hyperlipidemia Mother   ? Hypertension Mother   ? Kidney disease Father   ? Alzheimer's disease Father   ? ? ?Allergies  ?Allergen  Reactions  ? Pork-Derived Products   ?  Pt does not want any pork products  ? ? ?No current facility-administered medications on file prior to encounter.  ? ?Current Outpatient Medications on File Prior to Encounter  ?Medication Sig Dispense Refill  ? aspirin (ASPIRIN LOW DOSE) 81 MG chewable tablet CHEW 1 TABLET BY MOUTH DAILY. 90 tablet 1  ? labetalol (NORMODYNE) 100 MG tablet Take 1 tablet (100 mg total) by mouth 2 (two) times daily. 180 tablet 3  ? metoCLOPramide (REGLAN) 10 MG tablet Take 1 tablet (10 mg total) by mouth 3 (three) times daily before meals. 90 tablet 3  ? NIFEdipine (ADALAT CC) 90 MG 24 hr tablet Take 1 tablet (90 mg total) by mouth daily. 90 tablet 3  ? ondansetron (ZOFRAN-ODT) 4 MG disintegrating tablet Take 1 tablet (4 mg total) by mouth every 6 (six) hours as needed for nausea. 30 tablet 2  ? prochlorperazine (COMPAZINE) 10 MG tablet Take 1 tablet (10 mg total) by mouth 2 (two) times daily as needed for nausea or vomiting. 30 tablet 2  ? scopolamine (TRANSDERM-SCOP) 1 MG/3DAYS Place 1 patch (1.5 mg total) onto the skin  every 3 (three) days. 10 patch 12  ? Accu-Chek Softclix Lancets lancets Use as instructed; check blood glucose 4 times daily 200 each 10  ? acetaminophen (TYLENOL) 500 MG tablet Take 1,000 mg by mouth every 6 (six) hours as needed.    ? Blood Glucose Monitoring Suppl (ACCU-CHEK GUIDE) w/Device KIT use as directed 4 (four) times daily. 1 kit 0  ? calcium carbonate (TUMS) 500 MG chewable tablet Chew 1 tablet (200 mg of elemental calcium total) by mouth 2 (two) times daily as needed for indigestion or heartburn. (Patient not taking: Reported on 02/09/2022) 30 tablet 0  ? Continuous Blood Gluc Transmit (DEXCOM G6 TRANSMITTER) MISC 1 Device by Does not apply route every 3 (three) months. 3 each 1  ? cyclobenzaprine (FLEXERIL) 10 MG tablet Take 0.5-1 tablets (5-10 mg total) by mouth 3 (three) times daily as needed for muscle spasms. 30 tablet 2  ? glucose blood (ACCU-CHEK GUIDE) test  strip Use as instructed; check blood glucose 4 times daily 200 each 10  ? insulin aspart (NOVOLOG) 100 UNIT/ML FlexPen Inject 12 Units into the skin 3 (three) times daily with meals.    ? insulin glargine (LANTUS) 100 UNIT/ML Solostar Pen Inject 20-25 Units into the skin 2 (two) times daily. 20 in AM and 25 in PM 9 mL 11  ? potassium chloride SA (KLOR-CON M) 20 MEQ tablet Take 2 tablets (40 mEq total) by mouth daily for 3 days. 6 tablet 0  ? Prenatal Vit-Fe Fumarate-FA (PRENATAL VITAMIN) 27-0.8 MG TABS Take 1 tablet by mouth daily. 90 tablet 2  ? promethazine (PHENERGAN) 25 MG suppository Place 1 suppository (25 mg total) rectally every 6 (six) hours as needed for nausea or vomiting. 12 each 0  ? [DISCONTINUED] insulin detemir (LEVEMIR) 100 UNIT/ML injection Inject 20-40 Units into the skin See admin instructions. 40 units every morning and 20 units every night (Patient not taking: Reported on 04/11/2021)    ? ? ? ?ROS ?Pertinent positives and negative per HPI, all others reviewed and negative ? ?Physical Exam  ? ?BP 136/80   Pulse (!) 105   Temp 98.4 ?F (36.9 ?C) (Oral)   Resp 20   Ht '5\' 2"'  (1.575 m)   Wt 47.7 kg   LMP 09/20/2021   SpO2 100%   BMI 19.24 kg/m?  ? ?Patient Vitals for the past 24 hrs: ? BP Temp Temp src Pulse Resp SpO2 Height Weight  ?02/26/22 1515 136/80 -- -- (!) 105 -- -- -- --  ?02/26/22 1500 115/61 -- -- (!) 107 -- -- -- --  ?02/26/22 1445 127/65 -- -- 94 -- -- -- --  ?02/26/22 1430 119/72 -- -- 97 -- -- -- --  ?02/26/22 1415 (!) 158/127 -- -- -- -- -- -- --  ?02/26/22 1400 (!) 155/74 -- -- (!) 113 -- -- -- --  ?02/26/22 1341 (!) 158/101 -- -- 99 -- -- -- --  ?02/26/22 1308 (!) 163/102 -- -- (!) 102 -- -- -- --  ?02/26/22 1244 (!) 152/101 98.4 ?F (36.9 ?C) Oral (!) 115 20 100 % -- --  ?02/26/22 1242 -- -- -- -- -- -- '5\' 2"'  (1.575 m) 47.7 kg  ? ? ?Physical Exam ?Vitals and nursing note reviewed.  ?Constitutional:   ?   Appearance: Normal appearance. She is not ill-appearing, toxic-appearing  or diaphoretic.  ?Eyes:  ?   Conjunctiva/sclera: Conjunctivae normal.  ?   Pupils: Pupils are equal, round, and reactive to light.  ?Cardiovascular:  ?  Rate and Rhythm: Normal rate and regular rhythm.  ?   Heart sounds: Normal heart sounds.  ?Pulmonary:  ?   Effort: Pulmonary effort is normal. No respiratory distress.  ?   Breath sounds: Normal breath sounds.  ?Musculoskeletal:  ?   Cervical back: Neck supple.  ?   Right lower leg: No edema.  ?   Left lower leg: No edema.  ?Skin: ?   General: Skin is warm and dry.  ?Neurological:  ?   General: No focal deficit present.  ?   Mental Status: She is alert and oriented to person, place, and time.  ?   Deep Tendon Reflexes:  ?   Reflex Scores: ?     Patellar reflexes are 2+ on the right side. ?   Comments: No clonus  ?  ? ? ?Labs ?Results for orders placed or performed during the hospital encounter of 02/26/22 (from the past 24 hour(s))  ?Glucose, capillary     Status: Abnormal  ? Collection Time: 02/26/22  1:05 PM  ?Result Value Ref Range  ? Glucose-Capillary 292 (H) 70 - 99 mg/dL  ?Urinalysis, Routine w reflex microscopic Urine, Clean Catch     Status: Abnormal  ? Collection Time: 02/26/22  1:10 PM  ?Result Value Ref Range  ? Color, Urine YELLOW YELLOW  ? APPearance HAZY (A) CLEAR  ? Specific Gravity, Urine 1.026 1.005 - 1.030  ? pH 5.0 5.0 - 8.0  ? Glucose, UA >=500 (A) NEGATIVE mg/dL  ? Hgb urine dipstick SMALL (A) NEGATIVE  ? Bilirubin Urine NEGATIVE NEGATIVE  ? Ketones, ur 80 (A) NEGATIVE mg/dL  ? Protein, ur >=300 (A) NEGATIVE mg/dL  ? Nitrite NEGATIVE NEGATIVE  ? Leukocytes,Ua NEGATIVE NEGATIVE  ? RBC / HPF 0-5 0 - 5 RBC/hpf  ? WBC, UA 0-5 0 - 5 WBC/hpf  ? Bacteria, UA RARE (A) NONE SEEN  ? Squamous Epithelial / LPF 6-10 0 - 5  ? Mucus PRESENT   ? Hyaline Casts, UA PRESENT   ?CBC     Status: Abnormal  ? Collection Time: 02/26/22  1:46 PM  ?Result Value Ref Range  ? WBC 5.6 4.0 - 10.5 K/uL  ? RBC 5.40 (H) 3.87 - 5.11 MIL/uL  ? Hemoglobin 15.4 (H) 12.0 - 15.0 g/dL   ? HCT 46.1 (H) 36.0 - 46.0 %  ? MCV 85.4 80.0 - 100.0 fL  ? MCH 28.5 26.0 - 34.0 pg  ? MCHC 33.4 30.0 - 36.0 g/dL  ? RDW 13.7 11.5 - 15.5 %  ? Platelets 271 150 - 400 K/uL  ? nRBC 0.0 0.0 - 0.2 %  ?Comprehensive

## 2022-02-27 LAB — BETA-HYDROXYBUTYRIC ACID
Beta-Hydroxybutyric Acid: 0.07 mmol/L (ref 0.05–0.27)
Beta-Hydroxybutyric Acid: 0.1 mmol/L (ref 0.05–0.27)
Beta-Hydroxybutyric Acid: 0.36 mmol/L — ABNORMAL HIGH (ref 0.05–0.27)
Beta-Hydroxybutyric Acid: 0.53 mmol/L — ABNORMAL HIGH (ref 0.05–0.27)

## 2022-02-27 LAB — BASIC METABOLIC PANEL
Anion gap: 4 — ABNORMAL LOW (ref 5–15)
Anion gap: 4 — ABNORMAL LOW (ref 5–15)
Anion gap: 5 (ref 5–15)
BUN: 5 mg/dL — ABNORMAL LOW (ref 6–20)
BUN: 6 mg/dL (ref 6–20)
BUN: <5 mg/dL — ABNORMAL LOW (ref 6–20)
CO2: 17 mmol/L — ABNORMAL LOW (ref 22–32)
CO2: 18 mmol/L — ABNORMAL LOW (ref 22–32)
CO2: 20 mmol/L — ABNORMAL LOW (ref 22–32)
Calcium: 7.8 mg/dL — ABNORMAL LOW (ref 8.9–10.3)
Calcium: 7.9 mg/dL — ABNORMAL LOW (ref 8.9–10.3)
Calcium: 8.1 mg/dL — ABNORMAL LOW (ref 8.9–10.3)
Chloride: 118 mmol/L — ABNORMAL HIGH (ref 98–111)
Chloride: 119 mmol/L — ABNORMAL HIGH (ref 98–111)
Chloride: 122 mmol/L — ABNORMAL HIGH (ref 98–111)
Creatinine, Ser: 0.36 mg/dL — ABNORMAL LOW (ref 0.44–1.00)
Creatinine, Ser: 0.4 mg/dL — ABNORMAL LOW (ref 0.44–1.00)
Creatinine, Ser: 0.46 mg/dL (ref 0.44–1.00)
GFR, Estimated: >60 mL/min (ref >60)
GFR, Estimated: >60 mL/min (ref >60)
GFR, Estimated: >60 mL/min (ref >60)
Glucose, Bld: 119 mg/dL — ABNORMAL HIGH (ref 70–99)
Glucose, Bld: 130 mg/dL — ABNORMAL HIGH (ref 70–99)
Glucose, Bld: 195 mg/dL — ABNORMAL HIGH (ref 70–99)
Potassium: 2.6 mmol/L — CL (ref 3.5–5.1)
Potassium: 2.8 mmol/L — ABNORMAL LOW (ref 3.5–5.1)
Potassium: 2.8 mmol/L — ABNORMAL LOW (ref 3.5–5.1)
Sodium: 140 mmol/L (ref 135–145)
Sodium: 143 mmol/L (ref 135–145)
Sodium: 144 mmol/L (ref 135–145)

## 2022-02-27 LAB — GLUCOSE, CAPILLARY
Glucose-Capillary: 100 mg/dL — ABNORMAL HIGH (ref 70–99)
Glucose-Capillary: 104 mg/dL — ABNORMAL HIGH (ref 70–99)
Glucose-Capillary: 105 mg/dL — ABNORMAL HIGH (ref 70–99)
Glucose-Capillary: 109 mg/dL — ABNORMAL HIGH (ref 70–99)
Glucose-Capillary: 112 mg/dL — ABNORMAL HIGH (ref 70–99)
Glucose-Capillary: 113 mg/dL — ABNORMAL HIGH (ref 70–99)
Glucose-Capillary: 117 mg/dL — ABNORMAL HIGH (ref 70–99)
Glucose-Capillary: 120 mg/dL — ABNORMAL HIGH (ref 70–99)
Glucose-Capillary: 122 mg/dL — ABNORMAL HIGH (ref 70–99)
Glucose-Capillary: 131 mg/dL — ABNORMAL HIGH (ref 70–99)
Glucose-Capillary: 137 mg/dL — ABNORMAL HIGH (ref 70–99)
Glucose-Capillary: 138 mg/dL — ABNORMAL HIGH (ref 70–99)
Glucose-Capillary: 146 mg/dL — ABNORMAL HIGH (ref 70–99)
Glucose-Capillary: 147 mg/dL — ABNORMAL HIGH (ref 70–99)
Glucose-Capillary: 149 mg/dL — ABNORMAL HIGH (ref 70–99)
Glucose-Capillary: 70 mg/dL (ref 70–99)
Glucose-Capillary: 73 mg/dL (ref 70–99)
Glucose-Capillary: 88 mg/dL (ref 70–99)
Glucose-Capillary: 89 mg/dL (ref 70–99)
Glucose-Capillary: 91 mg/dL (ref 70–99)
Glucose-Capillary: 93 mg/dL (ref 70–99)
Glucose-Capillary: 96 mg/dL (ref 70–99)
Glucose-Capillary: 98 mg/dL (ref 70–99)

## 2022-02-27 LAB — COMPREHENSIVE METABOLIC PANEL
ALT: 8 U/L (ref 0–44)
AST: 13 U/L — ABNORMAL LOW (ref 15–41)
Albumin: 2.2 g/dL — ABNORMAL LOW (ref 3.5–5.0)
Alkaline Phosphatase: 52 U/L (ref 38–126)
Anion gap: 5 (ref 5–15)
BUN: <5 mg/dL — ABNORMAL LOW (ref 6–20)
CO2: 25 mmol/L (ref 22–32)
Calcium: 8.2 mg/dL — ABNORMAL LOW (ref 8.9–10.3)
Chloride: 112 mmol/L — ABNORMAL HIGH (ref 98–111)
Creatinine, Ser: 0.36 mg/dL — ABNORMAL LOW (ref 0.44–1.00)
GFR, Estimated: >60 mL/min (ref >60)
Glucose, Bld: 77 mg/dL (ref 70–99)
Potassium: 2.4 mmol/L — CL (ref 3.5–5.1)
Sodium: 142 mmol/L (ref 135–145)
Total Bilirubin: 0.6 mg/dL (ref 0.3–1.2)
Total Protein: 5.3 g/dL — ABNORMAL LOW (ref 6.5–8.1)

## 2022-02-27 MED ORDER — POTASSIUM CHLORIDE CRYS ER 20 MEQ PO TBCR
30.0000 meq | EXTENDED_RELEASE_TABLET | Freq: Two times a day (BID) | ORAL | Status: DC
  Filled 2022-02-27: qty 2

## 2022-02-27 MED ORDER — LACTATED RINGERS IV BOLUS
1000.0000 mL | Freq: Once | INTRAVENOUS | Status: AC
  Administered 2022-02-27: 1000 mL via INTRAVENOUS

## 2022-02-27 MED ORDER — INSULIN GLARGINE-YFGN 100 UNIT/ML ~~LOC~~ SOLN
12.0000 [IU] | Freq: Two times a day (BID) | SUBCUTANEOUS | Status: DC
  Administered 2022-02-27 – 2022-03-03 (×7): 12 [IU] via SUBCUTANEOUS
  Filled 2022-02-27 (×9): qty 0.12

## 2022-02-27 MED ORDER — POTASSIUM CHLORIDE 10 MEQ/100ML IV SOLN
10.0000 meq | INTRAVENOUS | Status: AC
  Administered 2022-02-27 – 2022-02-28 (×5): 10 meq via INTRAVENOUS
  Filled 2022-02-27 (×6): qty 100

## 2022-02-27 MED ORDER — INSULIN ASPART 100 UNIT/ML IJ SOLN
0.0000 [IU] | Freq: Four times a day (QID) | INTRAMUSCULAR | Status: DC
  Administered 2022-02-28 (×2): 1 [IU] via SUBCUTANEOUS

## 2022-02-27 MED ORDER — POTASSIUM CHLORIDE 10 MEQ/100ML IV SOLN
10.0000 meq | INTRAVENOUS | Status: AC
  Administered 2022-02-27 (×4): 10 meq via INTRAVENOUS
  Filled 2022-02-27 (×4): qty 100

## 2022-02-27 NOTE — Progress Notes (Signed)
Patient ID: Kristin Clay, female   DOB: 03/16/1986, 36 y.o.   MRN: [id number] ? ? ?Assessment/Plan: ?Principal Problem: ?  DKA, type 1 (HCC) ?Active Problems: ?  Language barrier ?  Rh negative, antepartum ?  Type 1 diabetes mellitus affecting pregnancy in second trimester, antepartum ?  Chronic hypertension affecting pregnancy ? ?DKA - Class R, F ?Improving ?Insulin gtt ?Serial labs ?Will transition to SQ insulin ?IVF bolus ? ?T1DM ? ?Hypokalemia ?Replete K ? ?Nausea/Vomiting ?Add scopolamine ? ?Hypertension ?Needs IV treatment ? ?Subjective: ?Interval History:feels some better but still with N/V ? ?Objective: ?Vital signs in last 24 hours: ?Temp:  [98.2 ?F (36.8 ?C)-98.7 ?F (37.1 ?C)] 98.3 ?F (36.8 ?C) (04/07 0454) ?Pulse Rate:  [81-117] 81 (04/07 0750) ?Resp:  [17-20] 18 (04/07 0454) ?BP: (115-168)/(61-127) 137/77 (04/07 0750) ?SpO2:  [100 %] 100 % (04/06 1552) ?Weight:  [47.7 kg] 47.7 kg (04/06 1242) ? ?Intake/Output from previous day: ?04/06 0701 - 04/07 0700 ?In: 2372.3 [I.V.:232.2] ?Out: 850 [Urine:750] ?Intake/Output this shift: ?No intake/output data recorded. ? ?General appearance: alert, cooperative, and appears stated age ?Head: Normocephalic, without obvious abnormality, atraumatic ?Neck: supple, symmetrical, trachea midline ?Lungs: normal effort ?Heart: regular rate and rhythm ?Abdomen: soft, non-tender; bowel sounds normal; no masses,  no organomegaly ?Extremities: extremities normal, atraumatic, no cyanosis or edema ?Skin: Skin color, texture, turgor normal. No rashes or lesions ? ?Results for orders placed or performed during the hospital encounter of 02/26/22 (from the past 24 hour(s))  ?Glucose, capillary     Status: Abnormal  ? Collection Time: 02/26/22  1:05 PM  ?Result Value Ref Range  ? Glucose-Capillary 292 (H) 70 - 99 mg/dL  ?Urinalysis, Routine w reflex microscopic Urine, Clean Catch     Status: Abnormal  ? Collection Time: 02/26/22  1:10 PM  ?Result Value Ref Range  ? Color,  Urine YELLOW YELLOW  ? APPearance HAZY (A) CLEAR  ? Specific Gravity, Urine 1.026 1.005 - 1.030  ? pH 5.0 5.0 - 8.0  ? Glucose, UA >=500 (A) NEGATIVE mg/dL  ? Hgb urine dipstick SMALL (A) NEGATIVE  ? Bilirubin Urine NEGATIVE NEGATIVE  ? Ketones, ur 80 (A) NEGATIVE mg/dL  ? Protein, ur >=300 (A) NEGATIVE mg/dL  ? Nitrite NEGATIVE NEGATIVE  ? Leukocytes,Ua NEGATIVE NEGATIVE  ? RBC / HPF 0-5 0 - 5 RBC/hpf  ? WBC, UA 0-5 0 - 5 WBC/hpf  ? Bacteria, UA RARE (A) NONE SEEN  ? Squamous Epithelial / LPF 6-10 0 - 5  ? Mucus PRESENT   ? Hyaline Casts, UA PRESENT   ?Protein / creatinine ratio, urine     Status: Abnormal  ? Collection Time: 02/26/22  1:10 PM  ?Result Value Ref Range  ? Creatinine, Urine 78.95 mg/dL  ? Total Protein, Urine 184 mg/dL  ? Protein Creatinine Ratio 2.33 (H) 0.00 - 0.15 mg/mg[Cre]  ?CBC     Status: Abnormal  ? Collection Time: 02/26/22  1:46 PM  ?Result Value Ref Range  ? WBC 5.6 4.0 - 10.5 K/uL  ? RBC 5.40 (H) 3.87 - 5.11 MIL/uL  ? Hemoglobin 15.4 (H) 12.0 - 15.0 g/dL  ? HCT 46.1 (H) 36.0 - 46.0 %  ? MCV 85.4 80.0 - 100.0 fL  ? MCH 28.5 26.0 - 34.0 pg  ? MCHC 33.4 30.0 - 36.0 g/dL  ? RDW 13.7 11.5 - 15.5 %  ? Platelets 271 150 - 400 K/uL  ? nRBC 0.0 0.0 - 0.2 %  ?Comprehensive metabolic panel  Status: Abnormal  ? Collection Time: 02/26/22  1:46 PM  ?Result Value Ref Range  ? Sodium 140 135 - 145 mmol/L  ? Potassium 3.0 (L) 3.5 - 5.1 mmol/L  ? Chloride 114 (H) 98 - 111 mmol/L  ? CO2 12 (L) 22 - 32 mmol/L  ? Glucose, Bld 301 (H) 70 - 99 mg/dL  ? BUN 9 6 - 20 mg/dL  ? Creatinine, Ser 0.62 0.44 - 1.00 mg/dL  ? Calcium 8.5 (L) 8.9 - 10.3 mg/dL  ? Total Protein 6.6 6.5 - 8.1 g/dL  ? Albumin 2.9 (L) 3.5 - 5.0 g/dL  ? AST 7 (L) 15 - 41 U/L  ? ALT 7 0 - 44 U/L  ? Alkaline Phosphatase 67 38 - 126 U/L  ? Total Bilirubin 1.0 0.3 - 1.2 mg/dL  ? GFR, Estimated >60 >60 mL/min  ? Anion gap 14 5 - 15  ?Beta-hydroxybutyric acid     Status: Abnormal  ? Collection Time: 02/26/22  1:46 PM  ?Result Value Ref Range  ?  Beta-Hydroxybutyric Acid 5.99 (H) 0.05 - 0.27 mmol/L  ?Lipase, blood     Status: None  ? Collection Time: 02/26/22  1:46 PM  ?Result Value Ref Range  ? Lipase 29 11 - 51 U/L  ?Amylase     Status: None  ? Collection Time: 02/26/22  1:46 PM  ?Result Value Ref Range  ? Amylase 53 28 - 100 U/L  ?Hemoglobin A1c     Status: Abnormal  ? Collection Time: 02/26/22  2:13 PM  ?Result Value Ref Range  ? Hgb A1c MFr Bld 8.4 (H) 4.8 - 5.6 %  ? Mean Plasma Glucose 194.38 mg/dL  ?Glucose, capillary     Status: Abnormal  ? Collection Time: 02/26/22  2:50 PM  ?Result Value Ref Range  ? Glucose-Capillary 227 (H) 70 - 99 mg/dL  ?Glucose, capillary     Status: Abnormal  ? Collection Time: 02/26/22  4:12 PM  ?Result Value Ref Range  ? Glucose-Capillary 124 (H) 70 - 99 mg/dL  ?Glucose, capillary     Status: Abnormal  ? Collection Time: 02/26/22  4:54 PM  ?Result Value Ref Range  ? Glucose-Capillary 111 (H) 70 - 99 mg/dL  ? Comment 1 Notify RN   ?Glucose, capillary     Status: Abnormal  ? Collection Time: 02/26/22  5:56 PM  ?Result Value Ref Range  ? Glucose-Capillary 107 (H) 70 - 99 mg/dL  ?Glucose, capillary     Status: Abnormal  ? Collection Time: 02/26/22  7:22 PM  ?Result Value Ref Range  ? Glucose-Capillary 133 (H) 70 - 99 mg/dL  ?Basic metabolic panel     Status: Abnormal  ? Collection Time: 02/26/22  7:23 PM  ?Result Value Ref Range  ? Sodium 142 135 - 145 mmol/L  ? Potassium 3.4 (L) 3.5 - 5.1 mmol/L  ? Chloride 119 (H) 98 - 111 mmol/L  ? CO2 14 (L) 22 - 32 mmol/L  ? Glucose, Bld 215 (H) 70 - 99 mg/dL  ? BUN 8 6 - 20 mg/dL  ? Creatinine, Ser 0.44 0.44 - 1.00 mg/dL  ? Calcium 7.9 (L) 8.9 - 10.3 mg/dL  ? GFR, Estimated >60 >60 mL/min  ? Anion gap 9 5 - 15  ?Glucose, capillary     Status: Abnormal  ? Collection Time: 02/26/22  8:24 PM  ?Result Value Ref Range  ? Glucose-Capillary 186 (H) 70 - 99 mg/dL  ?Glucose, capillary     Status: Abnormal  ?  Collection Time: 02/26/22  9:30 PM  ?Result Value Ref Range  ? Glucose-Capillary 169 (H)  70 - 99 mg/dL  ?Glucose, capillary     Status: Abnormal  ? Collection Time: 02/26/22 10:20 PM  ?Result Value Ref Range  ? Glucose-Capillary 177 (H) 70 - 99 mg/dL  ?Glucose, capillary     Status: Abnormal  ? Collection Time: 02/26/22 11:24 PM  ?Result Value Ref Range  ? Glucose-Capillary 183 (H) 70 - 99 mg/dL  ?Basic metabolic panel     Status: Abnormal  ? Collection Time: 02/26/22 11:37 PM  ?Result Value Ref Range  ? Sodium 140 135 - 145 mmol/L  ? Potassium 2.8 (L) 3.5 - 5.1 mmol/L  ? Chloride 119 (H) 98 - 111 mmol/L  ? CO2 17 (L) 22 - 32 mmol/L  ? Glucose, Bld 195 (H) 70 - 99 mg/dL  ? BUN 6 6 - 20 mg/dL  ? Creatinine, Ser 0.40 (L) 0.44 - 1.00 mg/dL  ? Calcium 7.8 (L) 8.9 - 10.3 mg/dL  ? GFR, Estimated >60 >60 mL/min  ? Anion gap 4 (L) 5 - 15  ?Beta-hydroxybutyric acid     Status: Abnormal  ? Collection Time: 02/26/22 11:37 PM  ?Result Value Ref Range  ? Beta-Hydroxybutyric Acid 0.36 (H) 0.05 - 0.27 mmol/L  ?Glucose, capillary     Status: Abnormal  ? Collection Time: 02/27/22 12:24 AM  ?Result Value Ref Range  ? Glucose-Capillary 146 (H) 70 - 99 mg/dL  ?Glucose, capillary     Status: Abnormal  ? Collection Time: 02/27/22  1:21 AM  ?Result Value Ref Range  ? Glucose-Capillary 149 (H) 70 - 99 mg/dL  ?Glucose, capillary     Status: Abnormal  ? Collection Time: 02/27/22  2:21 AM  ?Result Value Ref Range  ? Glucose-Capillary 147 (H) 70 - 99 mg/dL  ?Glucose, capillary     Status: Abnormal  ? Collection Time: 02/27/22  3:21 AM  ?Result Value Ref Range  ? Glucose-Capillary 137 (H) 70 - 99 mg/dL  ?Basic metabolic panel     Status: Abnormal  ? Collection Time: 02/27/22  3:40 AM  ?Result Value Ref Range  ? Sodium 144 135 - 145 mmol/L  ? Potassium 2.8 (L) 3.5 - 5.1 mmol/L  ? Chloride 122 (H) 98 - 111 mmol/L  ? CO2 18 (L) 22 - 32 mmol/L  ? Glucose, Bld 119 (H) 70 - 99 mg/dL  ? BUN 5 (L) 6 - 20 mg/dL  ? Creatinine, Ser 0.46 0.44 - 1.00 mg/dL  ? Calcium 7.9 (L) 8.9 - 10.3 mg/dL  ? GFR, Estimated >60 >60 mL/min  ? Anion gap 4 (L) 5  - 15  ?Glucose, capillary     Status: None  ? Collection Time: 02/27/22  4:28 AM  ?Result Value Ref Range  ? Glucose-Capillary 88 70 - 99 mg/dL  ?Glucose, capillary     Status: None  ? Collection Time: 04/07

## 2022-02-27 NOTE — Plan of Care (Signed)
?  Problem: Education: Goal: Knowledge of General Education information will improve Description: Including pain rating scale, medication(s)/side effects and non-pharmacologic comfort measures Outcome: Progressing   Problem: Health Behavior/Discharge Planning: Goal: Ability to manage health-related needs will improve Outcome: Progressing   Problem: Clinical Measurements: Goal: Ability to maintain clinical measurements within normal limits will improve Outcome: Progressing Goal: Will remain free from infection Outcome: Progressing Goal: Diagnostic test results will improve Outcome: Progressing Goal: Respiratory complications will improve Outcome: Progressing Goal: Cardiovascular complication will be avoided Outcome: Progressing   Problem: Activity: Goal: Risk for activity intolerance will decrease Outcome: Progressing   Problem: Nutrition: Goal: Adequate nutrition will be maintained Outcome: Progressing   Problem: Coping: Goal: Level of anxiety will decrease Outcome: Progressing   Problem: Elimination: Goal: Will not experience complications related to bowel motility Outcome: Progressing Goal: Will not experience complications related to urinary retention Outcome: Progressing   Problem: Pain Managment: Goal: General experience of comfort will improve Outcome: Progressing   Problem: Safety: Goal: Ability to remain free from injury will improve Outcome: Progressing   Problem: Skin Integrity: Goal: Risk for impaired skin integrity will decrease Outcome: Progressing   Problem: Education: Goal: Knowledge of disease or condition will improve Outcome: Progressing Goal: Knowledge of the prescribed therapeutic regimen will improve Outcome: Progressing Goal: Individualized Educational Video(s) Outcome: Progressing   Problem: Clinical Measurements: Goal: Complications related to the disease process, condition or treatment will be avoided or minimized Outcome:  Progressing   Problem: Education: Goal: Ability to describe self-care measures that may prevent or decrease complications (Diabetes Survival Skills Education) will improve Outcome: Progressing Goal: Individualized Educational Video(s) Outcome: Progressing   Problem: Coping: Goal: Ability to adjust to condition or change in health will improve Outcome: Progressing   Problem: Fluid Volume: Goal: Ability to maintain a balanced intake and output will improve Outcome: Progressing   Problem: Health Behavior/Discharge Planning: Goal: Ability to identify and utilize available resources and services will improve Outcome: Progressing Goal: Ability to manage health-related needs will improve Outcome: Progressing   Problem: Metabolic: Goal: Ability to maintain appropriate glucose levels will improve Outcome: Progressing   Problem: Nutritional: Goal: Maintenance of adequate nutrition will improve Outcome: Progressing Goal: Progress toward achieving an optimal weight will improve Outcome: Progressing   Problem: Skin Integrity: Goal: Risk for impaired skin integrity will decrease Outcome: Progressing   Problem: Tissue Perfusion: Goal: Adequacy of tissue perfusion will improve Outcome: Progressing   

## 2022-02-27 NOTE — Progress Notes (Signed)
Inpatient Diabetes Program Recommendations ? ?ADA Standards of Care 2021 ?Diabetes in Pregnancy Target Glucose Ranges: ? ?Fasting: 60 - 90 mg/dL ?Preprandial: 60 - 105 mg/dL ?1 hr postprandial: Less than 184m/dL (from first bite of meal) ?2 hr postprandial: Less than 120 mg/dL (from first bit of meal) ?  ? ?Lab Results  ?Component Value Date  ? GLUCAP 104 (H) 02/27/2022  ? HGBA1C 8.4 (H) 02/26/2022  ? ? ?Review of Glycemic Control ? Latest Reference Range & Units 02/27/22 07:20 02/27/22 08:25 02/27/22 09:22 02/27/22 10:34 02/27/22 11:26 02/27/22 12:26  ?Glucose-Capillary 70 - 99 mg/dL 131 (H) 122 (H) 109 (H) 113 (H) 138 (H) 104 (H)  ?(H): Data is abnormally high ? ?Diabetes history: DM1(does not make insulin.  Needs correction, basal and meal coverage) ? ?Outpatient Diabetes medications: Lantus 20 units QAM, 25 units QPM, Novolog 15 units TID ?Current orders for Inpatient glycemic control: IV insulin ? ?Inpatient Diabetes Program Recommendations:   ? ?Once criteria met and MD is ready to transition to SQ insulin, please consider: ? ?Semglee 12 units BID (2 hrs prior to discontinuing IV insulin) ?Novolog 0-9 units TID and 0-5 QHS ?Novolog 3 units TID with meals IF eats at least 50% ? ?Spoke with patient at bedside.  She was admitted in February with DKA and was placed on IV insulin.  Our team spoke with her then.  She is current with her OB and MFM.  Has an appointment April 13th for Dexcom placement and education.  She states she has been giving basal insulin but not her rapid insulin because she has not been eating.  This was the case last admission as well.  Discussed sick day management.  She would likely benefit from a sensitive correction scale at home if appropriate.  This way when she is not eating she does not need to administer Novolog 15 but can correct her elevated BG.  She has not been checking her CBG at home for greater than 10 days because she has been sick.  Encouraged her to still check CBGs and  notify MD if consistently elevated.   ? ?Discussed importance of optimal glucose control during pregnancy, long and short term complications on her and fetus.  She verbalizes understanding.   ? ?Will continue to follow while inpatient. ? ?Thank you, ?JReche Dixon MSN, RN ?Diabetes Coordinator ?Inpatient Diabetes Program ?[phone](team pager from 8a-5p) ? ? ? ? ? ?

## 2022-02-28 DIAGNOSIS — O24012 Pre-existing diabetes mellitus, type 1, in pregnancy, second trimester: Secondary | ICD-10-CM | POA: Diagnosis not present

## 2022-02-28 DIAGNOSIS — O10919 Unspecified pre-existing hypertension complicating pregnancy, unspecified trimester: Secondary | ICD-10-CM | POA: Diagnosis not present

## 2022-02-28 DIAGNOSIS — E1010 Type 1 diabetes mellitus with ketoacidosis without coma: Secondary | ICD-10-CM | POA: Diagnosis not present

## 2022-02-28 LAB — COMPREHENSIVE METABOLIC PANEL
ALT: 8 U/L (ref 0–44)
AST: 10 U/L — ABNORMAL LOW (ref 15–41)
Albumin: 2.2 g/dL — ABNORMAL LOW (ref 3.5–5.0)
Alkaline Phosphatase: 52 U/L (ref 38–126)
Anion gap: 4 — ABNORMAL LOW (ref 5–15)
BUN: <5 mg/dL — ABNORMAL LOW (ref 6–20)
CO2: 24 mmol/L (ref 22–32)
Calcium: 8.1 mg/dL — ABNORMAL LOW (ref 8.9–10.3)
Chloride: 111 mmol/L (ref 98–111)
Creatinine, Ser: 0.32 mg/dL — ABNORMAL LOW (ref 0.44–1.00)
GFR, Estimated: >60 mL/min (ref >60)
Glucose, Bld: 95 mg/dL (ref 70–99)
Potassium: 2.9 mmol/L — ABNORMAL LOW (ref 3.5–5.1)
Sodium: 139 mmol/L (ref 135–145)
Total Bilirubin: 0.6 mg/dL (ref 0.3–1.2)
Total Protein: 5.1 g/dL — ABNORMAL LOW (ref 6.5–8.1)

## 2022-02-28 LAB — GLUCOSE, CAPILLARY
Glucose-Capillary: 105 mg/dL — ABNORMAL HIGH (ref 70–99)
Glucose-Capillary: 57 mg/dL — ABNORMAL LOW (ref 70–99)
Glucose-Capillary: 85 mg/dL (ref 70–99)
Glucose-Capillary: 88 mg/dL (ref 70–99)
Glucose-Capillary: 93 mg/dL (ref 70–99)

## 2022-02-28 LAB — BETA-HYDROXYBUTYRIC ACID: Beta-Hydroxybutyric Acid: 0.3 mmol/L — ABNORMAL HIGH (ref 0.05–0.27)

## 2022-02-28 LAB — MAGNESIUM: Magnesium: 1.3 mg/dL — ABNORMAL LOW (ref 1.7–2.4)

## 2022-02-28 MED ORDER — LABETALOL HCL 200 MG PO TABS
200.0000 mg | ORAL_TABLET | Freq: Two times a day (BID) | ORAL | Status: DC
  Administered 2022-02-28 – 2022-03-05 (×11): 200 mg via ORAL
  Filled 2022-02-28 (×11): qty 1

## 2022-02-28 MED ORDER — POTASSIUM CHLORIDE 10 MEQ/100ML IV SOLN
10.0000 meq | INTRAVENOUS | Status: AC
  Administered 2022-02-28 (×6): 10 meq via INTRAVENOUS
  Filled 2022-02-28 (×5): qty 100

## 2022-02-28 MED ORDER — MAGNESIUM SULFATE 2 GM/50ML IV SOLN
2.0000 g | Freq: Once | INTRAVENOUS | Status: AC
  Administered 2022-02-28: 2 g via INTRAVENOUS
  Filled 2022-02-28: qty 50

## 2022-02-28 NOTE — Progress Notes (Signed)
Hypoglycemic Event ? ?CBG: 57 ? ?Treatment: 4 oz juice/soda at 1812 ? ?Symptoms:  Nauseous, headache ? ?Follow-up CBG: Time:1830 CBG Result: 85 ? ?Possible Reasons for Event: Vomiting ? ?Comments/MD notified: Dr. Rip Harbour at [phone] ? ? ? ?Josef Tourigny  [place] ? ? ?

## 2022-02-28 NOTE — Progress Notes (Signed)
Patient ID: Kristin Clay, female   DOB: Apr 08, 1986, 36 y.o.   MRN: [id number] ? ?Kristin Clay is a 36 y.o. [id number] at [redacted]w[redacted]d by 18 week ultrasound with PMHx notable for DKA, IDDM, & chronic hypertension  admitted for DKA.   ? ?Length of Stay:  2  Days ? ?Assessment/Plan: ?Principal Problem: ?  DKA, type 1 (HCC) ?Active Problems: ?  Language barrier ?  Rh negative, antepartum ?  Type 1 diabetes mellitus affecting pregnancy in second trimester, antepartum ?  Chronic hypertension affecting pregnancy ? ?T1DM, DKA - Class R, F ?Improving ?Transitioned from insulin drip to Semglee 12 units BID and sensitive correction scale yesterday ?Continue Serial labs ? ?Hypokalemia ?Replete K as needed also monitor Mg levels ? ?Nausea/Vomiting ?Continue antiemetics, ADAT ? ?Hypertension ?Needs IV treatment for now, transition to oral meds once able to tolerate this ? ?Routine antenatal care ? ? ? ?Subjective: ?Feels some better but still with N/V.  Arabic [employer] interpreter used for encounter. ? ?Objective: ?Vital signs in last 24 hours: ?Temp:  [98 ?F (36.7 ?C)-98.9 ?F (37.2 ?C)] 98 ?F (36.7 ?C) (04/08 1761) ?Pulse Rate:  [81-101] 87 (04/08 0020) ?Resp:  [16-18] 18 (04/08 6073) ?BP: (121-165)/(62-93) 121/62 (04/08 0020) ?SpO2:  [99 %-100 %] 100 % (04/08 7106) ? ?Intake/Output from previous day: ?04/07 0701 - 04/08 0700 ?In: 1245.9 [I.V.:1245.9] ?Out: 1200 [Urine:900] ?Intake/Output this shift: ?No intake/output data recorded. ? ?General appearance: alert, cooperative, and appears stated age ?Head: Normocephalic, without obvious abnormality, atraumatic ?Neck: supple, symmetrical, trachea midline ?Lungs: normal effort ?Heart: regular rate and rhythm ?Abdomen: soft, non-tender; bowel sounds normal; no masses,  no organomegaly ?Extremities: extremities normal, atraumatic, no cyanosis or edema ?Skin: Skin color, texture, turgor normal. No rashes or lesions ? ?Results for orders placed or performed during the hospital  encounter of 02/26/22 (from the past 24 hour(s))  ?Glucose, capillary     Status: Abnormal  ? Collection Time: 02/27/22  8:25 AM  ?Result Value Ref Range  ? Glucose-Capillary 122 (H) 70 - 99 mg/dL  ?Glucose, capillary     Status: Abnormal  ? Collection Time: 02/27/22  9:22 AM  ?Result Value Ref Range  ? Glucose-Capillary 109 (H) 70 - 99 mg/dL  ?Glucose, capillary     Status: Abnormal  ? Collection Time: 02/27/22 10:34 AM  ?Result Value Ref Range  ? Glucose-Capillary 113 (H) 70 - 99 mg/dL  ?Glucose, capillary     Status: Abnormal  ? Collection Time: 02/27/22 11:26 AM  ?Result Value Ref Range  ? Glucose-Capillary 138 (H) 70 - 99 mg/dL  ?Glucose, capillary     Status: Abnormal  ? Collection Time: 02/27/22 12:26 PM  ?Result Value Ref Range  ? Glucose-Capillary 104 (H) 70 - 99 mg/dL  ?Glucose, capillary     Status: None  ? Collection Time: 02/27/22  1:28 PM  ?Result Value Ref Range  ? Glucose-Capillary 89 70 - 99 mg/dL  ?Beta-hydroxybutyric acid     Status: None  ? Collection Time: 02/27/22  1:54 PM  ?Result Value Ref Range  ? Beta-Hydroxybutyric Acid 0.10 0.05 - 0.27 mmol/L  ?Glucose, capillary     Status: Abnormal  ? Collection Time: 02/27/22  2:44 PM  ?Result Value Ref Range  ? Glucose-Capillary 105 (H) 70 - 99 mg/dL  ?Glucose, capillary     Status: Abnormal  ? Collection Time: 02/27/22  3:52 PM  ?Result Value Ref Range  ? Glucose-Capillary 120 (H) 70 - 99 mg/dL  ?Glucose, capillary  Status: None  ? Collection Time: 02/27/22  4:54 PM  ?Result Value Ref Range  ? Glucose-Capillary 93 70 - 99 mg/dL  ?Glucose, capillary     Status: Abnormal  ? Collection Time: 02/27/22  6:24 PM  ?Result Value Ref Range  ? Glucose-Capillary 117 (H) 70 - 99 mg/dL  ?Comprehensive metabolic panel     Status: Abnormal  ? Collection Time: 02/27/22  8:15 PM  ?Result Value Ref Range  ? Sodium 142 135 - 145 mmol/L  ? Potassium 2.4 (LL) 3.5 - 5.1 mmol/L  ? Chloride 112 (H) 98 - 111 mmol/L  ? CO2 25 22 - 32 mmol/L  ? Glucose, Bld 77 70 - 99 mg/dL   ? BUN <5 (L) 6 - 20 mg/dL  ? Creatinine, Ser 0.36 (L) 0.44 - 1.00 mg/dL  ? Calcium 8.2 (L) 8.9 - 10.3 mg/dL  ? Total Protein 5.3 (L) 6.5 - 8.1 g/dL  ? Albumin 2.2 (L) 3.5 - 5.0 g/dL  ? AST 13 (L) 15 - 41 U/L  ? ALT 8 0 - 44 U/L  ? Alkaline Phosphatase 52 38 - 126 U/L  ? Total Bilirubin 0.6 0.3 - 1.2 mg/dL  ? GFR, Estimated >60 >60 mL/min  ? Anion gap 5 5 - 15  ?Beta-hydroxybutyric acid     Status: None  ? Collection Time: 02/27/22  8:15 PM  ?Result Value Ref Range  ? Beta-Hydroxybutyric Acid 0.07 0.05 - 0.27 mmol/L  ?Glucose, capillary     Status: None  ? Collection Time: 02/27/22  8:18 PM  ?Result Value Ref Range  ? Glucose-Capillary 73 70 - 99 mg/dL  ?Glucose, capillary     Status: None  ? Collection Time: 02/27/22  9:21 PM  ?Result Value Ref Range  ? Glucose-Capillary 70 70 - 99 mg/dL  ?Glucose, capillary     Status: Abnormal  ? Collection Time: 02/27/22  9:52 PM  ?Result Value Ref Range  ? Glucose-Capillary 100 (H) 70 - 99 mg/dL  ?Glucose, capillary     Status: None  ? Collection Time: 02/27/22 10:29 PM  ?Result Value Ref Range  ? Glucose-Capillary 91 70 - 99 mg/dL  ?Glucose, capillary     Status: None  ? Collection Time: 02/27/22 11:30 PM  ?Result Value Ref Range  ? Glucose-Capillary 96 70 - 99 mg/dL  ?Glucose, capillary     Status: Abnormal  ? Collection Time: 02/28/22 12:19 AM  ?Result Value Ref Range  ? Glucose-Capillary 105 (H) 70 - 99 mg/dL  ?Comprehensive metabolic panel     Status: Abnormal  ? Collection Time: 02/28/22  5:31 AM  ?Result Value Ref Range  ? Sodium 139 135 - 145 mmol/L  ? Potassium 2.9 (L) 3.5 - 5.1 mmol/L  ? Chloride 111 98 - 111 mmol/L  ? CO2 24 22 - 32 mmol/L  ? Glucose, Bld 95 70 - 99 mg/dL  ? BUN <5 (L) 6 - 20 mg/dL  ? Creatinine, Ser 0.32 (L) 0.44 - 1.00 mg/dL  ? Calcium 8.1 (L) 8.9 - 10.3 mg/dL  ? Total Protein 5.1 (L) 6.5 - 8.1 g/dL  ? Albumin 2.2 (L) 3.5 - 5.0 g/dL  ? AST 10 (L) 15 - 41 U/L  ? ALT 8 0 - 44 U/L  ? Alkaline Phosphatase 52 38 - 126 U/L  ? Total Bilirubin 0.6 0.3 -  1.2 mg/dL  ? GFR, Estimated >60 >60 mL/min  ? Anion gap 4 (L) 5 - 15  ?Beta-hydroxybutyric acid     Status:  Abnormal  ? Collection Time: 02/28/22  5:31 AM  ?Result Value Ref Range  ? Beta-Hydroxybutyric Acid 0.30 (H) 0.05 - 0.27 mmol/L  ?Glucose, capillary     Status: None  ? Collection Time: 02/28/22  6:05 AM  ?Result Value Ref Range  ? Glucose-Capillary 88 70 - 99 mg/dL  ? ? ?Studies/Results: ?Panretinal Photocoagulation - OD - Right Eye ? ?Result Date: 02/16/2022 ?LASER PROCEDURE NOTE Diagnosis:   Proliferative Diabetic Retinopathy, RIGHT EYE Procedure:  Pan-retinal photocoagulation using slit lamp laser, RIGHT EYE Anesthesia:  Topical Surgeon: Rennis Chris, MD, PhD Informed consent obtained, operative eye marked, and time out performed prior to initiation of laser. Lumenis [id number] slit lamp laser Pattern: 2x2 square, 3x3 square Power: 260 mW Duration: 30 msec Spot size: 200 microns # spots: [id number] spots Complications: None RTC: 2 wks for PRP, contralateral eye Patient tolerated the procedure well and received written and verbal post-procedure care information/education.  ? ?[place] MFM OB DETAIL +14 WK ? ?Result Date: 02/02/2022 ?----------------------------------------------------------------------  OBSTETRICS REPORT                       (Signed Final 02/02/2022 04:04 pm) ---------------------------------------------------------------------- Patient Info  ID #:       [id number]                          D.O.B.:  October 28, 1986 (35 yrs)  Name:       Kristin Clay                Visit Date: 02/02/2022 10:19 am              Kristin Clay ---------------------------------------------------------------------- Performed By  Attending:        Noralee Space MD        Ref. Address:     [address]                                                             [place], [place]                                                             [address]  Performed By:     Reinaldo Raddle             Location[facility]         [facility]                                     [facility]

## 2022-03-01 DIAGNOSIS — O24012 Pre-existing diabetes mellitus, type 1, in pregnancy, second trimester: Secondary | ICD-10-CM | POA: Diagnosis not present

## 2022-03-01 DIAGNOSIS — E1010 Type 1 diabetes mellitus with ketoacidosis without coma: Secondary | ICD-10-CM | POA: Diagnosis not present

## 2022-03-01 DIAGNOSIS — O10919 Unspecified pre-existing hypertension complicating pregnancy, unspecified trimester: Secondary | ICD-10-CM | POA: Diagnosis not present

## 2022-03-01 LAB — COMPREHENSIVE METABOLIC PANEL
ALT: 6 U/L (ref 0–44)
AST: 10 U/L — ABNORMAL LOW (ref 15–41)
Albumin: 2 g/dL — ABNORMAL LOW (ref 3.5–5.0)
Alkaline Phosphatase: 45 U/L (ref 38–126)
Anion gap: 6 (ref 5–15)
BUN: <5 mg/dL — ABNORMAL LOW (ref 6–20)
CO2: 22 mmol/L (ref 22–32)
Calcium: 7.8 mg/dL — ABNORMAL LOW (ref 8.9–10.3)
Chloride: 108 mmol/L (ref 98–111)
Creatinine, Ser: <0.3 mg/dL — ABNORMAL LOW (ref 0.44–1.00)
Glucose, Bld: 71 mg/dL (ref 70–99)
Potassium: 2.9 mmol/L — ABNORMAL LOW (ref 3.5–5.1)
Sodium: 136 mmol/L (ref 135–145)
Total Bilirubin: 0.7 mg/dL (ref 0.3–1.2)
Total Protein: 4.9 g/dL — ABNORMAL LOW (ref 6.5–8.1)

## 2022-03-01 LAB — GLUCOSE, CAPILLARY
Glucose-Capillary: 135 mg/dL — ABNORMAL HIGH (ref 70–99)
Glucose-Capillary: 144 mg/dL — ABNORMAL HIGH (ref 70–99)
Glucose-Capillary: 156 mg/dL — ABNORMAL HIGH (ref 70–99)
Glucose-Capillary: 181 mg/dL — ABNORMAL HIGH (ref 70–99)
Glucose-Capillary: 48 mg/dL — ABNORMAL LOW (ref 70–99)
Glucose-Capillary: 72 mg/dL (ref 70–99)
Glucose-Capillary: 78 mg/dL (ref 70–99)
Glucose-Capillary: 85 mg/dL (ref 70–99)

## 2022-03-01 LAB — MAGNESIUM: Magnesium: 1.5 mg/dL — ABNORMAL LOW (ref 1.7–2.4)

## 2022-03-01 MED ORDER — INSULIN ASPART 100 UNIT/ML IJ SOLN
0.0000 [IU] | Freq: Four times a day (QID) | INTRAMUSCULAR | Status: DC
  Administered 2022-03-01: 2 [IU] via SUBCUTANEOUS
  Administered 2022-03-01: 3 [IU] via SUBCUTANEOUS
  Administered 2022-03-01 (×2): 2 [IU] via SUBCUTANEOUS
  Administered 2022-03-02 (×2): 3 [IU] via SUBCUTANEOUS
  Administered 2022-03-02 (×2): 2 [IU] via SUBCUTANEOUS
  Administered 2022-03-03: 3 [IU] via SUBCUTANEOUS
  Administered 2022-03-03 (×2): 4 [IU] via SUBCUTANEOUS
  Administered 2022-03-04 (×2): 2 [IU] via SUBCUTANEOUS
  Administered 2022-03-04: 4 [IU] via SUBCUTANEOUS
  Administered 2022-03-04: 2 [IU] via SUBCUTANEOUS
  Administered 2022-03-05: 4 [IU] via SUBCUTANEOUS

## 2022-03-01 MED ORDER — POTASSIUM CHLORIDE CRYS ER 20 MEQ PO TBCR
40.0000 meq | EXTENDED_RELEASE_TABLET | Freq: Two times a day (BID) | ORAL | Status: DC
Start: 2022-03-01 — End: 2022-03-05
  Administered 2022-03-01 – 2022-03-05 (×8): 40 meq via ORAL
  Filled 2022-03-01 (×8): qty 2

## 2022-03-01 MED ORDER — MAGNESIUM SULFATE 4 GM/100ML IV SOLN
4.0000 g | Freq: Once | INTRAVENOUS | Status: AC
  Administered 2022-03-01: 4 g via INTRAVENOUS
  Filled 2022-03-01: qty 100

## 2022-03-01 NOTE — Progress Notes (Signed)
Hypoglycemic Event ? ?CBG: 48 ? ?Treatment: 4 oz juice/soda ? ?Symptoms:  dizziness  ? ?Follow-up CBG: [id number]   CBG Result:78 ? ?Possible Reasons for Event: Inadequate meal intake ? ?Comments/MD notified: Dr. Harolyn Rutherford ? ? ? ?Kristin Tamplin  Clay ? ? ?

## 2022-03-01 NOTE — Progress Notes (Signed)
Patient ID: Kristin Clay, female   DOB: 1986-04-12, 36 y.o.   MRN: [id number] ? ?Kristin Clay is a 36 y.o. [id number] at [redacted]w[redacted]d by 18 week ultrasound with PMHx notable for DKA, IDDM, & chronic hypertension  admitted for DKA.   ? ?Length of Stay:  3  Days ? ?Assessment/Plan: ?Principal Problem: ?  DKA, type 1 ([place]) ?Active Problems: ?  Language barrier ?  Rh negative, antepartum ?  Type 1 diabetes mellitus affecting pregnancy in second trimester, antepartum ?  Chronic hypertension affecting pregnancy ?  Hypokalemia ?  Hypomagnesemia ? ?T1DM, DKA - Class R, F ?Had a hypoglycemic event this morning with CBG of 48, treated. Semglee held ?Will advance diet as N/V is improved, monitor CBGs and continue sensitive correction scale yesterday ?Continue serial labs ? ?Hypokalemia/Hypomagnesemia ?Replete magnesium and K as needed, and  monitor levels ? ?Nausea/Vomiting ?Continue antiemetics, ADAT ? ?Hypertension ?Oral meds restarted, Procardia XL 90 mg daily and Labetalol 200 mg bid ?Monitor BP ? ?Reassuring FHR checks ?Continue routine antenatal care ? ? ? ?Subjective: ?Feels better today, able to tolerate clears. Less N/V.  Arabic [employer] interpreter used for encounter. ? ?Objective: ?Vital signs in last 24 hours: ?Temp:  [97.5 ?F (36.4 ?C)-98.2 ?F (36.8 ?C)] 98.2 ?F (36.8 ?C) (04/09 0751) ?Pulse Rate:  [78-91] 78 (04/09 0751) ?Resp:  [16-18] 16 (04/09 0751) ?BP: (117-148)/(67-90) 126/81 (04/09 0751) ? ?Intake/Output from previous day: ?04/08 0701 - 04/09 0700 ?In: 2923.5 [P.O.:601; I.V.:2322.5] ?Out: 2000 [Urine:1900] ?Intake/Output this shift: ?Total I/O ?In: -  ?Out: 300 [Urine:300] ? ?General appearance: alert, cooperative, and appears stated age ?Head: Normocephalic, without obvious abnormality, atraumatic ?Neck: supple, symmetrical, trachea midline ?Lungs: normal effort ?Heart: regular rate and rhythm ?Abdomen: soft, non-tender; bowel sounds normal; no masses,  no organomegaly ?Extremities: extremities  normal, atraumatic, no cyanosis or edema ?Skin: Skin color, texture, turgor normal. No rashes or lesions ? ?Results for orders placed or performed during the hospital encounter of 02/26/22 (from the past 24 hour(s))  ?Glucose, capillary     Status: None  ? Collection Time: 02/28/22 12:06 PM  ?Result Value Ref Range  ? Glucose-Capillary 93 70 - 99 mg/dL  ?Glucose, capillary     Status: Abnormal  ? Collection Time: 02/28/22  6:08 PM  ?Result Value Ref Range  ? Glucose-Capillary 57 (L) 70 - 99 mg/dL  ?Glucose, capillary     Status: None  ? Collection Time: 02/28/22  6:30 PM  ?Result Value Ref Range  ? Glucose-Capillary 85 70 - 99 mg/dL  ?Glucose, capillary     Status: None  ? Collection Time: 03/01/22 12:03 AM  ?Result Value Ref Range  ? Glucose-Capillary 85 70 - 99 mg/dL  ?Comprehensive metabolic panel     Status: Abnormal  ? Collection Time: 03/01/22  5:53 AM  ?Result Value Ref Range  ? Sodium 136 135 - 145 mmol/L  ? Potassium 2.9 (L) 3.5 - 5.1 mmol/L  ? Chloride 108 98 - 111 mmol/L  ? CO2 22 22 - 32 mmol/L  ? Glucose, Bld 71 70 - 99 mg/dL  ? BUN <5 (L) 6 - 20 mg/dL  ? Creatinine, Ser <0.30 (L) 0.44 - 1.00 mg/dL  ? Calcium 7.8 (L) 8.9 - 10.3 mg/dL  ? Total Protein 4.9 (L) 6.5 - 8.1 g/dL  ? Albumin 2.0 (L) 3.5 - 5.0 g/dL  ? AST 10 (L) 15 - 41 U/L  ? ALT 6 0 - 44 U/L  ? Alkaline Phosphatase 45 38 -  126 U/L  ? Total Bilirubin 0.7 0.3 - 1.2 mg/dL  ? GFR, Estimated NOT CALCULATED >60 mL/min  ? Anion gap 6 5 - 15  ?Magnesium     Status: Abnormal  ? Collection Time: 03/01/22  5:53 AM  ?Result Value Ref Range  ? Magnesium 1.5 (L) 1.7 - 2.4 mg/dL  ?Glucose, capillary     Status: None  ? Collection Time: 03/01/22  6:04 AM  ?Result Value Ref Range  ? Glucose-Capillary 72 70 - 99 mg/dL  ?Glucose, capillary     Status: Abnormal  ? Collection Time: 03/01/22  9:18 AM  ?Result Value Ref Range  ? Glucose-Capillary 48 (L) 70 - 99 mg/dL  ?Glucose, capillary     Status: None  ? Collection Time: 03/01/22  9:39 AM  ?Result Value Ref Range   ? Glucose-Capillary 78 70 - 99 mg/dL  ? ? ?Studies/Results: ?Panretinal Photocoagulation - OD - Right Eye ? ?Result Date: 02/16/2022 ?LASER PROCEDURE NOTE Diagnosis:   Proliferative Diabetic Retinopathy, RIGHT EYE Procedure:  Pan-retinal photocoagulation using slit lamp laser, RIGHT EYE Anesthesia:  Topical Surgeon: Bernarda Caffey, MD, PhD Informed consent obtained, operative eye marked, and time out performed prior to initiation of laser. Lumenis [id number] slit lamp laser Pattern: 2x2 square, 3x3 square Power: 260 mW Duration: 30 msec Spot size: 200 microns # spots: [id number] spots Complications: None RTC: 2 wks for PRP, contralateral eye Patient tolerated the procedure well and received written and verbal post-procedure care information/education.  ? ?[place] MFM OB DETAIL +14 WK ? ?Result Date: 02/02/2022 ?----------------------------------------------------------------------  OBSTETRICS REPORT                       (Signed Final 02/02/2022 04:04 pm) ---------------------------------------------------------------------- Patient Info  ID #:       [id number]                          D.O.B.:  1985/12/16 (35 yrs)  Name:       Kristin Clay                Visit Date: 02/02/2022 10:19 am              Monday ---------------------------------------------------------------------- Performed By  Attending:        Tama High MD        Ref. Address:     [place]                                                             [place], [place]  Performed By:     Germain Osgood            [facility]         [facility]                                     [facility] at                                                             [facility] [facility]                                                             [facility]  Referred By:      Osborne Oman MD  ---------------------------------------------------------------------- Orders  #  Description                           Code        Ordered By  1  [place] MFM OB DETAIL +14 WK               76811.01    Verita Schneiders ----------------------------------------------------------------------  #  Order #                     Accession #                Episode #  1  [id number]                   [id number]                 [id number] ---------------------------------------------------------------------- Indications  Advanced maternal age multigravida 31+,        O26.522  second trimester  Pre-existing diabetes, type 1, in pregnancy,   O24.012  second trimester  Hypertension - Chronic/Pre-existing            O10.019  (Labetalol, Procardia)  Encounter for antenatal screening for          Z36.3  malformations  Poor obstetric history: Previous               O09.299  preeclampsia / eclampsia/gestational HTN  Medical complication of pregnancy (Female      O26.90  circumcision)  [redacted] weeks gestation of pregnancy                Z3A.18 ---------------------------------------------------------------------- Fetal Evaluation  Num Of Fetuses:         1  Fetal Heart Rate(bpm):  144  Cardiac Activity:       Observed  Presentation:           Breech  Placenta:               Posterior  P. Cord Insertion:  Visualized, central  Amniotic Fluid  AFI FV:      Within normal limits                              Largest Pocket(cm)                              3.9 ---------------------------------------------------------------------- Biometry  BPD:      42.1  mm     G. Age:  18w 5d         65  %    CI:        78.97   %    70 - 86                                                          FL/HC:      17.6   %    15.8 - 18  HC:      149.8  mm     G. Age:  18w 0d         24  %    HC/AC:      1.11        1.07 - 1.29  AC:      134.6  mm     G. Age:  18w 6d         64  %    FL/BPD:     62.7   %  FL:       26.4  mm     G. Age:  18w 0d         29  %    FL/AC:       19.6   %    20 - 24  HUM:      25.6  mm     G. Age:  18w 0d         42  %  CER:        20  mm     G. Age:  19w 2d         86  %  NFT:       2.4  mm  LV:        8.3  mm  CM:        3.9  mm  Est. FW:     242  gm      0 lb 9

## 2022-03-02 ENCOUNTER — Encounter (INDEPENDENT_AMBULATORY_CARE_PROVIDER_SITE_OTHER): Payer: Medicaid Other | Admitting: Ophthalmology

## 2022-03-02 ENCOUNTER — Telehealth: Payer: Self-pay

## 2022-03-02 DIAGNOSIS — E1010 Type 1 diabetes mellitus with ketoacidosis without coma: Secondary | ICD-10-CM | POA: Diagnosis not present

## 2022-03-02 DIAGNOSIS — O24012 Pre-existing diabetes mellitus, type 1, in pregnancy, second trimester: Secondary | ICD-10-CM | POA: Diagnosis not present

## 2022-03-02 DIAGNOSIS — O10919 Unspecified pre-existing hypertension complicating pregnancy, unspecified trimester: Secondary | ICD-10-CM | POA: Diagnosis not present

## 2022-03-02 LAB — BASIC METABOLIC PANEL
Anion gap: 6 (ref 5–15)
BUN: <5 mg/dL — ABNORMAL LOW (ref 6–20)
CO2: 22 mmol/L (ref 22–32)
Calcium: 7.8 mg/dL — ABNORMAL LOW (ref 8.9–10.3)
Chloride: 109 mmol/L (ref 98–111)
Creatinine, Ser: 0.4 mg/dL — ABNORMAL LOW (ref 0.44–1.00)
GFR, Estimated: >60 mL/min (ref >60)
Glucose, Bld: 183 mg/dL — ABNORMAL HIGH (ref 70–99)
Potassium: 3.6 mmol/L (ref 3.5–5.1)
Sodium: 137 mmol/L (ref 135–145)

## 2022-03-02 LAB — GLUCOSE, CAPILLARY
Glucose-Capillary: 175 mg/dL — ABNORMAL HIGH (ref 70–99)
Glucose-Capillary: 165 mg/dL — ABNORMAL HIGH (ref 70–99)
Glucose-Capillary: 155 mg/dL — ABNORMAL HIGH (ref 70–99)
Glucose-Capillary: 180 mg/dL — ABNORMAL HIGH (ref 70–99)

## 2022-03-02 LAB — MAGNESIUM: Magnesium: 1.8 mg/dL (ref 1.7–2.4)

## 2022-03-02 NOTE — Progress Notes (Addendum)
Inpatient Diabetes Program Recommendations ? ?Diabetes Treatment Program Recommendations ? ?ADA Standards of Care ?Diabetes in Pregnancy Target Glucose Ranges: ? ?Fasting: 70 - 95 mg/dL ?1 hr postprandial: Less than 140mg /dL (from first bite of meal) ?2 hr postprandial: Less than 120 mg/dL (from first bite of meal)   ? ? Latest Reference Range & Units 03/01/22 06:04 03/01/22 09:18 03/01/22 09:39 03/01/22 12:13 03/01/22 15:11 03/01/22 17:31 03/01/22 22:41 03/02/22 05:45  ?Glucose-Capillary 70 - 99 mg/dL 72 48 (L) 78 05/02/22 (H) ? ?Novolog 2 units 144 (H) ? ?Novolog 2 units 156 (H) ? ?Novolog 2 units 181 (H) ? ?Novolog 2 units ? ?Semglee 12 units 175 (H)  ? ?Review of Glycemic Control ? ?Diabetes history: DM1 ?Outpatient Diabetes medications: Lantus 20 units QAM, 25 units QPM, Novolog 15 units TID ?Current orders for Inpatient glycemic control: Semglee 12 units BID, Novolog 0-14 units QID ? ?Inpatient Diabetes Program Recommendations:   ? ?Insulin: Morning dose of Semglee not given on 03/01/22 (10 am dose) so glucose up to 181 mg/dl at 05/01/22 last night.  Please consider ordering Novolog 3 units TID with meals for meal coverage if patient eats at least 50% of meals. ? ?Thanks, ?06:23, RN, MSN, CDE ?Diabetes Coordinator ?Inpatient Diabetes Program ?[phone] (Team Pager from 8am to 5pm) ? ? ?

## 2022-03-02 NOTE — Progress Notes (Shared)
?[facility] Note ? ?03/02/2022 ? ?  ? ?CHIEF COMPLAINT ?Patient presents for No chief complaint on file. ? ? ?HISTORY OF PRESENT ILLNESS: ?Kristin Clay is a 36 y.o. female who presents to the clinic today for:  ? ? ?Patient states has uncontrolled BS, up to 390's. Patein is [redacted] weeks pregnant. Diagnosed with diabetes 13 years ago.  ? ?Referring physician: ?Gevena Cotton MD ?[place], [place] [address] ? ?HISTORICAL INFORMATION:  ? ?Selected notes from the [place] ?Referred by Dr. Frederico Hamman for diabetic retinal evaluation ?LEE:  ?Ocular Hx- ?PMH-pregnancy, DM ?  ? ?CURRENT MEDICATIONS: ?No current facility-administered medications for this visit. (Ophthalmic Drugs)  ? ?No current outpatient medications on file. (Ophthalmic Drugs)  ? ?No current facility-administered medications for this visit. (Other)  ? ?No current outpatient medications on file. (Other)  ? ?Facility-Administered Medications Ordered in Other Visits (Other)  ?Medication Route  ? 0.9 %  sodium chloride infusion Intravenous  ? acetaminophen (TYLENOL) tablet 650 mg Oral  ? aspirin chewable tablet 162 mg Oral  ? calcium carbonate (TUMS - dosed in mg elemental calcium) chewable tablet 400 mg of elemental calcium Oral  ? dextrose 5 % in lactated ringers infusion Intravenous  ? dextrose 50 % solution 0-50 mL Intravenous  ? docusate sodium (COLACE) capsule 100 mg Oral  ? labetalol (NORMODYNE) injection 20 mg Intravenous  ? And  ? labetalol (NORMODYNE) injection 40 mg Intravenous  ? And  ? labetalol (NORMODYNE) injection 80 mg Intravenous  ? And  ? hydrALAZINE (APRESOLINE) injection 10 mg Intravenous  ? labetalol (NORMODYNE) injection 20 mg Intravenous  ? And  ? labetalol (NORMODYNE) injection 40 mg Intravenous  ? And  ? labetalol (NORMODYNE) injection 80 mg Intravenous  ? And  ? hydrALAZINE (APRESOLINE) injection 10 mg Intravenous  ? insulin aspart (novoLOG) injection 0-14 Units  Subcutaneous  ? insulin glargine-yfgn (SEMGLEE) injection 12 Units Subcutaneous  ? labetalol (NORMODYNE) tablet 200 mg Oral  ? lactated ringers infusion Intravenous  ? metoCLOPramide (REGLAN) tablet 10 mg Oral  ? NIFEdipine (PROCARDIA-XL/NIFEDICAL-XL) 24 hr tablet 90 mg Oral  ? ondansetron (ZOFRAN-ODT) disintegrating tablet 4 mg Oral  ? potassium chloride SA (KLOR-CON M) CR tablet 40 mEq Oral  ? prenatal multivitamin tablet 1 tablet Oral  ? prochlorperazine (COMPAZINE) tablet 10 mg Oral  ? promethazine (PHENERGAN) 25 mg in sodium chloride 0.9 % 50 mL IVPB Intravenous  ? scopolamine (TRANSDERM-SCOP) 1 MG/3DAYS 1.5 mg Transdermal  ? sodium chloride flush (NS) 0.9 % injection 3 mL Intravenous  ? sodium chloride flush (NS) 0.9 % injection 3 mL Intravenous  ? zolpidem (AMBIEN) tablet 5 mg Oral  ? ?REVIEW OF SYSTEMS: ? ? ?ALLERGIES ?Allergies  ?Allergen Reactions  ? Pork-Derived Products   ? ?PAST MEDICAL HISTORY ?Past Medical History:  ?Diagnosis Date  ? Chronic hypertension   ? Complication of anesthesia   ? with hand surgery, local anes did not work, tried twice- had to put her to sleep  ? Diabetic retinopathy ([place])   ? DKA (diabetic ketoacidosis) ([place]) 10/2020  ? Epidural hematoma ([place]) 07/04/2021  ? Obstetric vaginal laceration with type 3c third degree perineal laceration 06/29/2021  ? Pyelonephritis 10/2020  ? Rh negative status during pregnancy 05/01/2021  ? s/p Rhogam 05/13/21  ? Type I diabetes mellitus ([place])   ? dx 78yrs ago  ? UTI (urinary tract infection)   ? ?Past Surgical History:  ?Procedure Laterality Date  ?  female circumcision Bilateral   ? clitorectomy as well  ? FOOT SURGERY  2018  ? HAND SURGERY  2019  ? ? ?FAMILY HISTORY ?Family History  ?Problem Relation Age of Onset  ? Hyperlipidemia Mother   ? Hypertension Mother   ? Kidney disease Father   ? Alzheimer's disease Father   ? ?SOCIAL HISTORY ?Social History  ? ?Tobacco Use  ? Smoking status: Never  ? Smokeless tobacco: Never  ?Vaping Use  ? Vaping  Use: Never used  ?Substance Use Topics  ? Alcohol use: Never  ? Drug use: Never  ? ?  ? ?  ? ?OPHTHALMIC EXAM: ? ?Not recorded ?  ? ? ?IMAGING AND PROCEDURES  ?Imaging and Procedures for 03/02/2022 ? ? ?  ?  ? ?  ?ASSESSMENT/PLAN: ? ?  ICD-10-CM   ?1. Proliferative diabetic retinopathy of both eyes with macular edema associated with type 2 diabetes mellitus ([place])  [id number]   ?  ?2. Epiretinal membrane (ERM) of both eyes  H35.373   ?  ?3. High-risk pregnancy in second trimester  O09.92   ?  ?4. Essential hypertension  I10   ?  ?5. Hypertensive retinopathy of both eyes  H35.033   ?  ? ? ? ?1,2. 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, both eyes ?- The incidence, risk factors for progression, natural history and treatment options for diabetic retinopathy  were discussed with patient.   ?- The need for close monitoring of blood glucose, blood pressure, and serum lipids, avoiding cigarette or any type of tobacco, and the need for long term follow up was also discussed with patient. ?- last A1c 10.4 on 01.23.23 ?- BCVA OD: 20/25 OS: 20/25 ?- exam shows early +NVD and +NVE OU -- will need PRP OU ?- OCT OD shows  Scattered IRF/IRHM, greatest inferior macula. Positive PRF/ERM overlying disc, OS shows Pre retinal fibrosis/ERM overlying disc extending to macula w/ mild traction ?- The natural history, pathology, and characteristics of diabetic macular edema discussed with patient.  A generalized discussion of the major clinical trials concerning treatment of diabetic macular edema (ETDRS, DCT, SCORE, RISE / RIDE, and ongoing DRCR net studies) was completed.  This discussion included mention of the various approaches to treating diabetic macular edema (observation, laser photocoagulation, anti-VEGF injections with lucentis / Avastin / Eylea, steroid injections with Kenalog / Ozurdex, and intraocular surgery with vitrectomy).  The goal hemoglobin A1C of 6-7 was discussed, as well as importance of smoking cessation and  hypertension control.  Need for ongoing treatment and monitoring were specifically discussed with reference to chronic nature of diabetic macular edema. ?- recommend PRP laser OU, OD first ?- will have pt return next week for PRP OD ?- f/u 01/19/2022 @ 3 pm ? ?3-5. High risk pregnancy w/ +HTN and hypertensive retinopathy ? - pt reports she is [redacted] wks pregnant, Epic lists her as [redacted]w[redacted]d ?- pt w/ history of pre-eclampsia with prior pregnancy ?- discussed importance of tight BP control ?- monitor ? ?Ophthalmic Meds Ordered this visit:  ?No orders of the defined types were placed in this encounter. ?  ? ?No follow-ups on file. ? ?There are no Patient Instructions on file for this visit. ? ?Explained the diagnoses, plan, and follow up with the patient and they expressed understanding.  Patient expressed understanding of the importance of proper follow up care.  ? ?This document serves as a record of services personally performed by Gardiner Sleeper, MD, PhD. It was created on their  behalf by Roselee Nova, COMT. The creation of this record is the provider's dictation and/or activities during the visit. ? ?Electronically signed by: Roselee Nova, COMT 03/02/22 10:55 AM ? ?Gardiner Sleeper, M.D., Ph.D. ?Diseases & Surgery of the Retina and Vitreous ?[place] ? ? ? ?Abbreviations: ?M myopia (nearsighted); A astigmatism; H hyperopia (farsighted); P presbyopia; Mrx spectacle prescription;  CTL contact lenses; OD right eye; OS left eye; OU both eyes  XT exotropia; ET esotropia; PEK punctate epithelial keratitis; PEE punctate epithelial erosions; DES dry eye syndrome; MGD meibomian gland dysfunction; ATs artificial tears; PFAT's preservative free artificial tears; [place] nuclear sclerotic cataract; PSC posterior subcapsular cataract; ERM epi-retinal membrane; PVD posterior vitreous detachment; RD retinal detachment; DM diabetes mellitus; DR diabetic retinopathy; NPDR non-proliferative diabetic retinopathy; PDR  proliferative diabetic retinopathy; CSME clinically significant macular edema; DME diabetic macular edema; dbh dot blot hemorrhages; CWS cotton wool spot; POAG primary open angle glaucoma; C/D cup-to-disc r

## 2022-03-02 NOTE — Telephone Encounter (Addendum)
-----   Message from Milas Hock, MD sent at 02/26/2022 12:33 PM EDT ----- ?Please let pt know we are recommending follow up with endocrinology and nephrology for her diabetes. Please help arrange those appointments for her.  ? ?Thanks! pad ? ?Called [facility] at [phone] to f/u on referrals was informed that they will reach out to her however there new patient appt is not until October.  I informed registrar that I would let our provider know.   ? Addison Naegeli, RN  ?03/02/22 ? ?

## 2022-03-02 NOTE — Progress Notes (Signed)
Patient ID: Kristin Clay, female   DOB: 11/06/1986, 36 y.o.   MRN: [id number] ? ?Kristin Clay is a 36 y.o. [name] at 7012w3d by 18 week ultrasound with PMHx notable for DKA, IDDM, & chronic hypertension  admitted for DKA.   ? ?Length of Stay:  4  Days ? ?Assessment/Plan: ?Principal Problem: ?  DKA, type 1 (HCC) ?Active Problems: ?  Language barrier ?  Rh negative, antepartum ?  Type 1 diabetes mellitus affecting pregnancy in second trimester, antepartum ?  Chronic hypertension affecting pregnancy ?  Hypokalemia ?  Hypomagnesemia ? ?T1DM, DKA - Class R, F ?Elevated CBGs after resuming diet yesterday, will follow up DM coordinator recommendations about how to adjust insulin accordingly prior to discharge to home ?Continue DM diet.  ?Will start discharge planning ? ?Hypokalemia/Hypomagnesemia ?Normalized magnesium and potassium levels ? ?Nausea/Vomiting ?Resolved for now ? ?Hypertension ?Continue Procardia XL 90 mg daily and Labetalol 200 mg bid ?Monitor BP ? ?Reassuring FHR checks ?Continue routine antenatal care ?Hopeful for discharge later today or tomorrow. ? ? ? ?Subjective: ?Feels better today, able to tolerate diet yesterday. Denies N/V.  Arabic [employer] interpreter used for encounter. ? ?Objective: ?Vital signs in last 24 hours: ?Temp:  [97.1 ?F (36.2 ?C)-98.3 ?F (36.8 ?C)] 98.3 ?F (36.8 ?C) (04/10 [id number]) ?Pulse Rate:  [84-97] 90 (04/10 0826) ?Resp:  [15-18] 18 (04/10 0837) ?BP: (100-137)/(53-77) 137/77 (04/10 [id number]) ?SpO2:  [98 %] 98 % (04/10 0837) ? ?Intake/Output from previous day: ?04/09 0701 - 04/10 0700 ?In: 240 [P.O.:240] ?Out: 1000 [Urine:1000] ?Intake/Output this shift: ?No intake/output data recorded. ? ?General appearance: alert, cooperative, and appears stated age ?Head: Normocephalic, without obvious abnormality, atraumatic ?Neck: supple, symmetrical, trachea midline ?Lungs: normal effort ?Heart: regular rate and rhythm ?Abdomen: soft, non-tender; bowel sounds normal; no masses,  no  organomegaly ?Extremities: extremities normal, atraumatic, no cyanosis or edema ?Skin: Skin color, texture, turgor normal. No rashes or lesions ? ?Results for orders placed or performed during the hospital encounter of 02/26/22 (from the past 24 hour(s))  ?Glucose, capillary     Status: Abnormal  ? Collection Time: 03/01/22  9:18 AM  ?Result Value Ref Range  ? Glucose-Capillary 48 (L) 70 - 99 mg/dL  ?Glucose, capillary     Status: None  ? Collection Time: 03/01/22  9:39 AM  ?Result Value Ref Range  ? Glucose-Capillary 78 70 - 99 mg/dL  ?Glucose, capillary     Status: Abnormal  ? Collection Time: 03/01/22 12:13 PM  ?Result Value Ref Range  ? Glucose-Capillary 135 (H) 70 - 99 mg/dL  ?Glucose, capillary     Status: Abnormal  ? Collection Time: 03/01/22  3:11 PM  ?Result Value Ref Range  ? Glucose-Capillary 144 (H) 70 - 99 mg/dL  ?Glucose, capillary     Status: Abnormal  ? Collection Time: 03/01/22  5:31 PM  ?Result Value Ref Range  ? Glucose-Capillary 156 (H) 70 - 99 mg/dL  ?Glucose, capillary     Status: Abnormal  ? Collection Time: 03/01/22 10:41 PM  ?Result Value Ref Range  ? Glucose-Capillary 181 (H) 70 - 99 mg/dL  ?Magnesium     Status: None  ? Collection Time: 03/02/22  4:34 AM  ?Result Value Ref Range  ? Magnesium 1.8 1.7 - 2.4 mg/dL  ?Basic metabolic panel     Status: Abnormal  ? Collection Time: 03/02/22  4:34 AM  ?Result Value Ref Range  ? Sodium 137 135 - 145 mmol/L  ? Potassium 3.6 3.5 - 5.1  mmol/L  ? Chloride 109 98 - 111 mmol/L  ? CO2 22 22 - 32 mmol/L  ? Glucose, Bld 183 (H) 70 - 99 mg/dL  ? BUN <5 (L) 6 - 20 mg/dL  ? Creatinine, Ser 0.40 (L) 0.44 - 1.00 mg/dL  ? Calcium 7.8 (L) 8.9 - 10.3 mg/dL  ? GFR, Estimated >60 >60 mL/min  ? Anion gap 6 5 - 15  ?Glucose, capillary     Status: Abnormal  ? Collection Time: 03/02/22  5:45 AM  ?Result Value Ref Range  ? Glucose-Capillary 175 (H) 70 - 99 mg/dL  ? ? ?Studies/Results: ?Panretinal Photocoagulation - OD - Right Eye ? ?Result Date: 02/16/2022 ?LASER PROCEDURE  NOTE Diagnosis:   Proliferative Diabetic Retinopathy, RIGHT EYE Procedure:  Pan-retinal photocoagulation using slit lamp laser, RIGHT EYE Anesthesia:  Topical Surgeon: Rennis Chris, MD, PhD Informed consent obtained, operative eye marked, and time out performed prior to initiation of laser. [place] [id number] slit lamp laser Pattern: 2x2 square, 3x3 square Power: 260 mW Duration: 30 msec Spot size: 200 microns # spots: 1611 spots Complications: None RTC: 2 wks for PRP, contralateral eye Patient tolerated the procedure well and received written and verbal post-procedure care information/education.  ? ?[place] MFM OB DETAIL +14 WK ? ?Result Date: 02/02/2022 ?----------------------------------------------------------------------  OBSTETRICS REPORT                       (Signed Final 02/02/2022 04:04 pm) ---------------------------------------------------------------------- Patient Info  ID #:       [id number]                          D.O.B.:  [birth year] (35 yrs)  Name:       Kristin Clay                Visit Date: 02/02/2022 10:19 am              [place] ---------------------------------------------------------------------- Performed By  Attending:        Noralee Space MD        Ref. Address:     [address]                                                             [place], [place]                                                             [address]  Performed By:     Reinaldo Raddle            [facility]  Referred By:      Tereso Newcomer MD ---------------------------------------------------------------------- Orders  #  Description                           Code        Ordered By  1  [place] MFM OB DETAIL +14  WK               76811.01    Jaynie Collins ----------------------------------------------------------------------  #  Order #                     Accession #                Episode #  1  [id number]                   [id number]                 [id number] ---------------------------------------------------------------------- Indications  Advanced maternal age multigravida 64+,        O80.522  second trimester  Pre-existing diabetes, type 1, in pregnancy,   O24.012  second trimester  Hypertension - Chronic/Pre-existing            O10.019  (Labetalol, Procardia)  Encounter for antenatal screening for          Z36.3  malformations  Poor obstetric history: Previous               O09.299  preeclampsia / eclampsia/gestational HTN  Medical complication of pregnancy (Female      O26.90  circumcision)  [redacted] weeks gestation of pregnancy                Z3A.18 ---------------------------------------------------------------------- Fetal Evaluation  Num Of Fetuses:         1  Fetal Heart Rate(bpm):  144  Cardiac Activity:       Observed  Presentation:           Breech  Placenta:               Posterior  P. Cord Insertion:      Visualized, central  Amniotic Fluid  AFI FV:      Within normal limits                              Largest Pocket(cm)  3.9 ---------------------------------------------------------------------- Biometry  BPD:      42.1  mm     G. Age:  18w 5d         65  %    CI:        78.97   %    70 - 86                                                          FL/HC:      17.6   %    15.8 - 18  HC:      149.8  mm     G. Age:  18w 0d         24  %    HC/AC:      1.11        1.07 - 1.29  AC:      134.6  mm     G. Age:  18w 6d         64  %    FL/BPD:     62.7   %  FL:       26.4  mm     G. Age:  18w 0d         29  %    FL/AC:      19.6   %    20 - 24  HUM:      25.6  mm     G. Age:  18w 0d         42  %  CER:        20  mm     G. Age:  19w 2d         86  %  NFT:       2.4  mm  LV:        8.3  mm   CM:        3.9  mm  Est. FW:     242  gm      0 lb 9 oz     48  % ---------------------------------------------------------------------- OB History  Gravidity:    3  Living:       1 -------------------

## 2022-03-03 ENCOUNTER — Inpatient Hospital Stay (HOSPITAL_BASED_OUTPATIENT_CLINIC_OR_DEPARTMENT_OTHER): Payer: Medicaid Other

## 2022-03-03 ENCOUNTER — Ambulatory Visit: Payer: Medicaid Other

## 2022-03-03 DIAGNOSIS — O09292 Supervision of pregnancy with other poor reproductive or obstetric history, second trimester: Secondary | ICD-10-CM | POA: Diagnosis not present

## 2022-03-03 DIAGNOSIS — O24012 Pre-existing diabetes mellitus, type 1, in pregnancy, second trimester: Secondary | ICD-10-CM

## 2022-03-03 DIAGNOSIS — Z3A22 22 weeks gestation of pregnancy: Secondary | ICD-10-CM

## 2022-03-03 DIAGNOSIS — O10012 Pre-existing essential hypertension complicating pregnancy, second trimester: Secondary | ICD-10-CM

## 2022-03-03 DIAGNOSIS — O2692 Pregnancy related conditions, unspecified, second trimester: Secondary | ICD-10-CM

## 2022-03-03 DIAGNOSIS — O09522 Supervision of elderly multigravida, second trimester: Secondary | ICD-10-CM

## 2022-03-03 DIAGNOSIS — N90810 Female genital mutilation status, unspecified: Secondary | ICD-10-CM

## 2022-03-03 DIAGNOSIS — E1010 Type 1 diabetes mellitus with ketoacidosis without coma: Secondary | ICD-10-CM

## 2022-03-03 LAB — GLUCOSE, CAPILLARY
Glucose-Capillary: 242 mg/dL — ABNORMAL HIGH (ref 70–99)
Glucose-Capillary: 201 mg/dL — ABNORMAL HIGH (ref 70–99)
Glucose-Capillary: 209 mg/dL — ABNORMAL HIGH (ref 70–99)
Glucose-Capillary: 180 mg/dL — ABNORMAL HIGH (ref 70–99)

## 2022-03-03 IMAGING — US US MFM OB FOLLOW-UP
1 series · 13 of 28 positions shown · non-contrast
Comparison: none

[Series 1: us mfm ob follow-up · 13 of 70 slices shown]
[im 3/70]
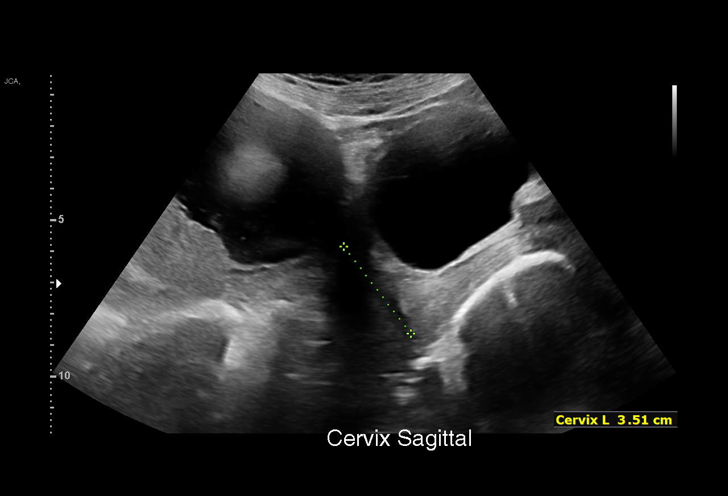
[im 8/70]
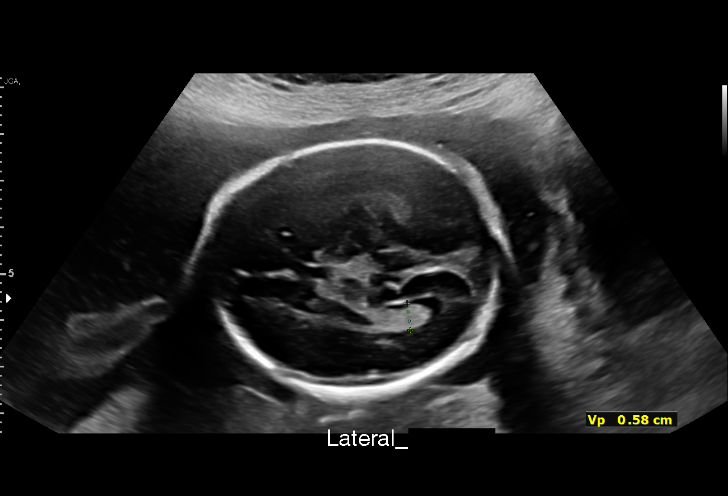
[im 13/70]
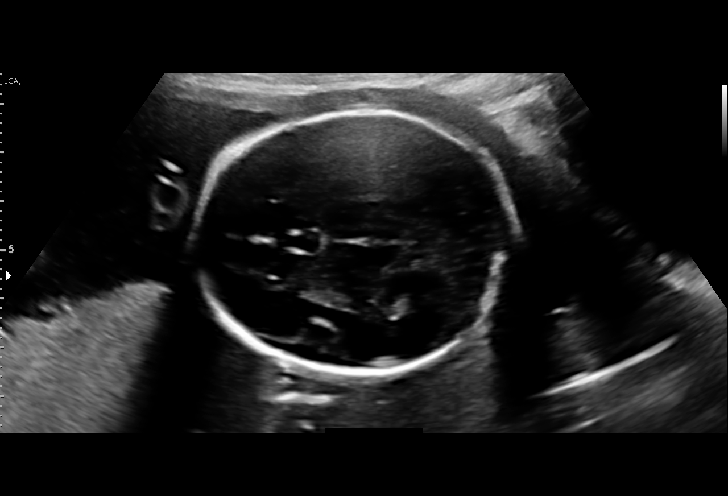
[im 18/70]
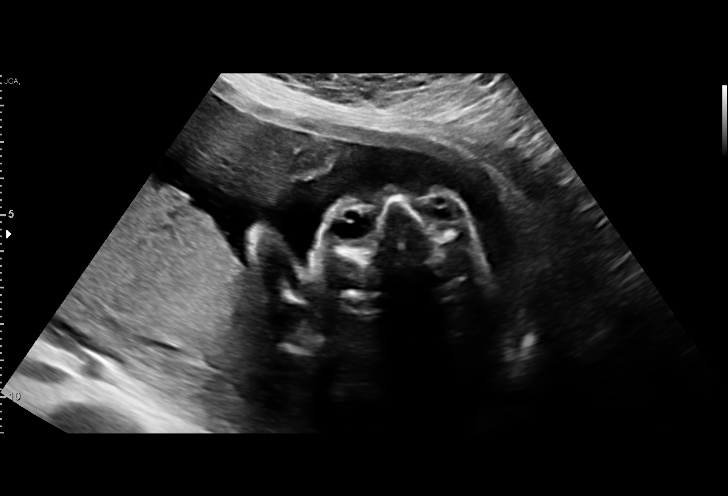
[im 24/70]
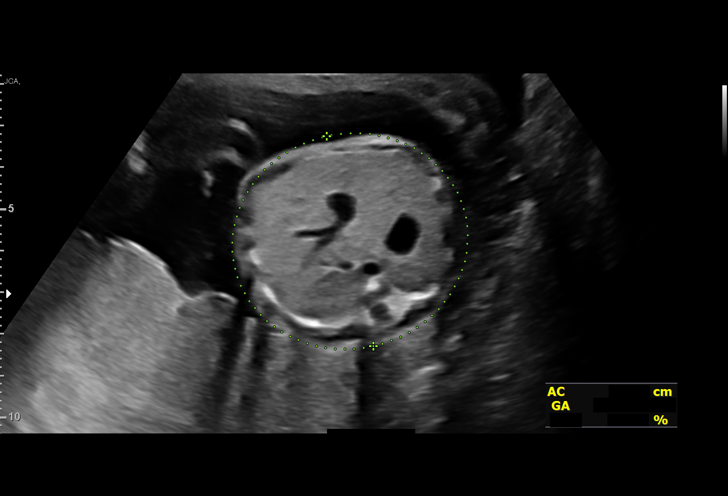
[im 29/70]
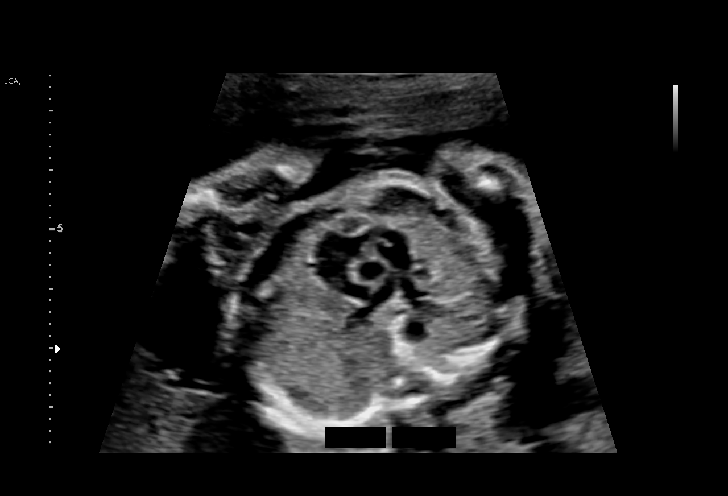
[im 36/70]
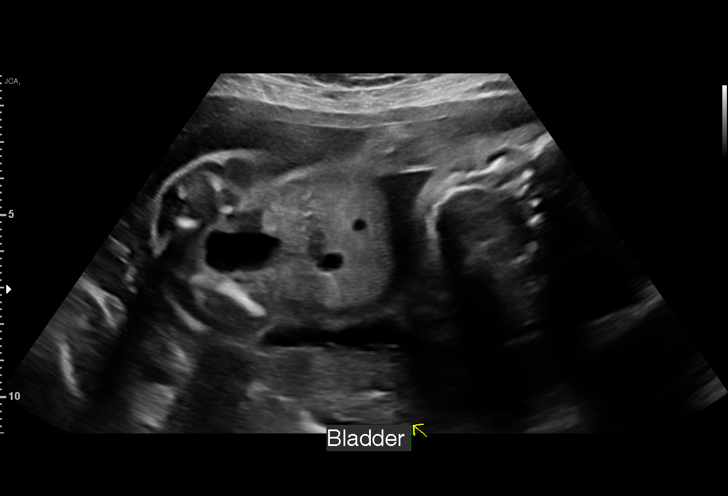
[im 41/70]
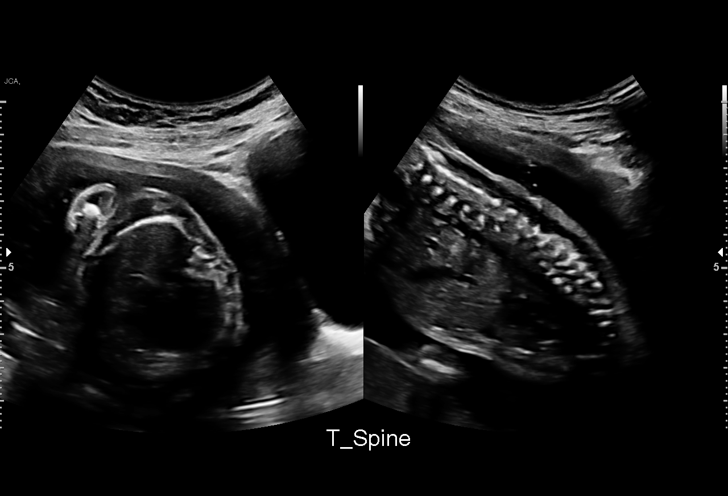
[im 47/70]
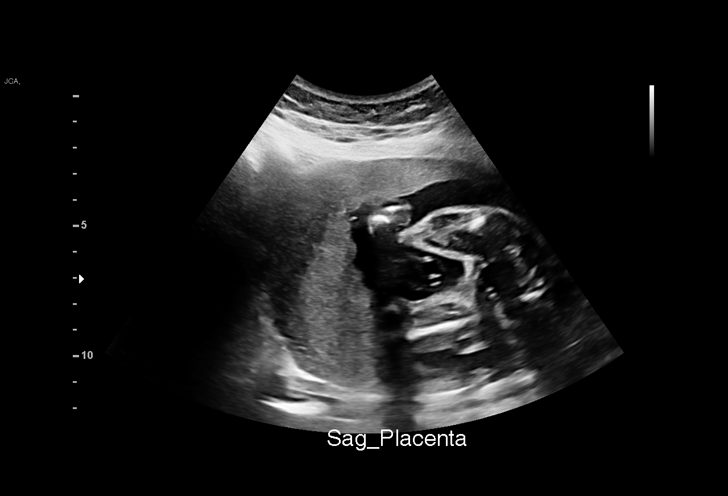
[im 52/70]
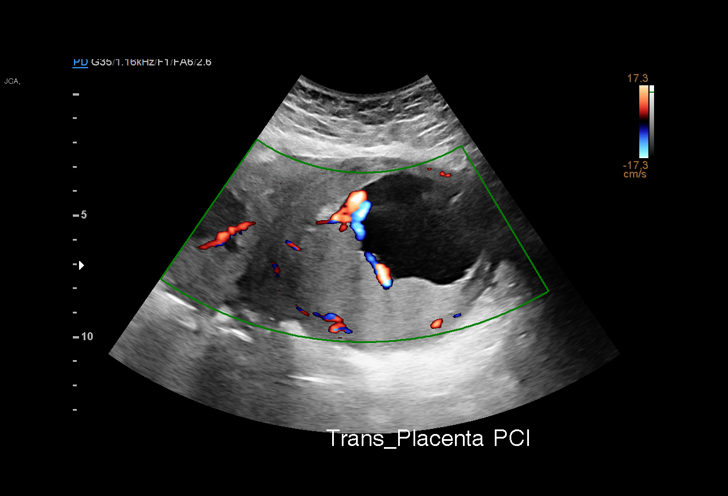
[im 57/70]
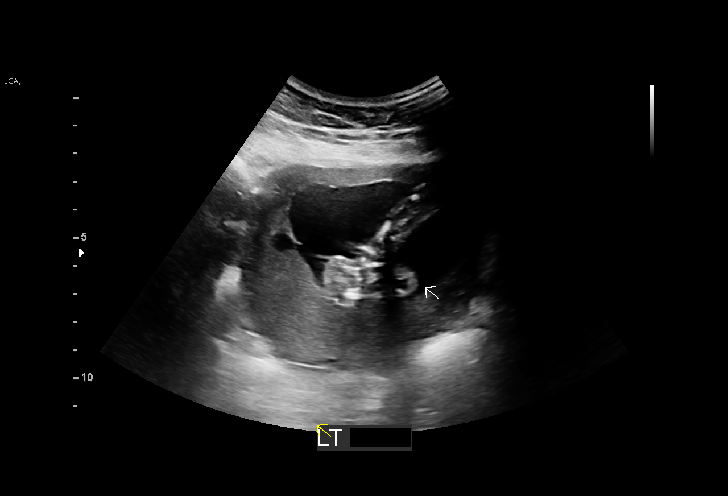
[im 62/70]
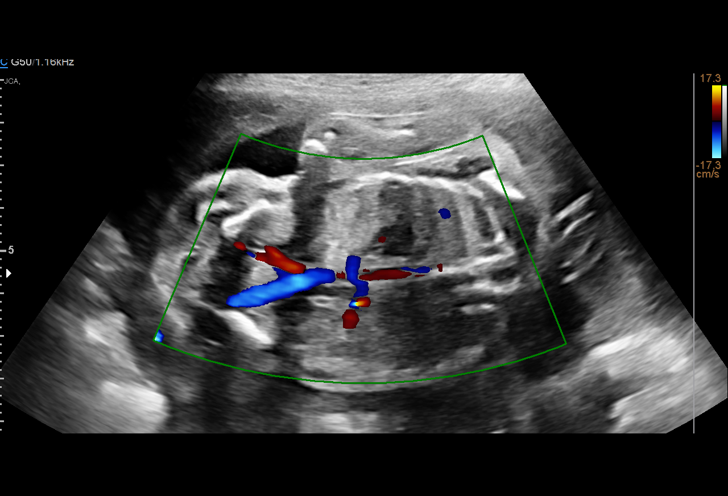
[im 67/70]
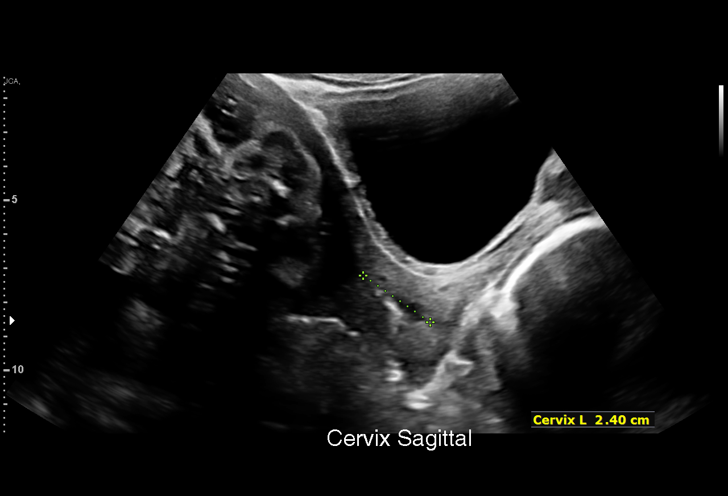

[13 of 28 positions shown; findings below may reference images not displayed]

HOSEIN

                   Care

Indications

 Advanced maternal age multigravida 35+,        [ZY]
 second trimester
 Pre-existing diabetes, type 1, in pregnancy,   [ZY]
 second trimester
 Hypertension - Chronic/Pre-existing            [ZY]
 (Labetalol, Procardia)
 Poor obstetric history: Previous               [ZY]
 preeclampsia / eclampsia/gestational HTN
 Antenatal follow-up for nonvisualized fetal    [ZY]
 anatomy
 22 weeks gestation of pregnancy
 Medical complication of pregnancy (Female      [ZY]
 circumcision)
Fetal Evaluation

 Num Of Fetuses:         1
 Fetal Heart Rate(bpm):  143
 Cardiac Activity:       Observed
 Presentation:           Cephalic
 Placenta:               Posterior
 P. Cord Insertion:      Previously Visualized

 Amniotic Fluid
 AFI FV:      Within normal limits

                             Largest Pocket(cm)

Biometry

 BPD:      53.1  mm     G. Age:  22w 1d         29  %    CI:        73.37   %    70 - 86
                                                         FL/HC:      19.1   %    19.2 -
 HC:       197   mm     G. Age:  21w 6d         14  %    HC/AC:      1.13        1.05 -
 AC:       174   mm     G. Age:  22w 2d         34  %    FL/BPD:     70.8   %    71 - 87
 FL:       37.6  mm     G. Age:  22w 0d         22  %    FL/AC:      21.6   %    20 - 24
 CER:      24.6  mm     G. Age:  22w 4d         73  %

 LV:        5.4  mm
 CM:        4.3  mm

 Est. FW:     480  gm      1 lb 1 oz     24  %
OB History

 Gravidity:    3
 Living:       1
Gestational Age

 LMP:           23w 3d        Date:  [DATE]                 EDD:   [DATE]
 U/S Today:     22w 1d                                        EDD:   [DATE]
 Best:          22w 4d     Det. By:  U/S  ([DATE])          EDD:   [DATE]
Anatomy

 Cranium:               Appears normal         Aortic Arch:            Previously seen
 Cavum:                 Appears normal         Ductal Arch:            Previously seen
 Ventricles:            Appears normal         Diaphragm:              Appears normal
 Choroid Plexus:        Appears normal         Stomach:                Appears normal, left
                                                                       sided
 Cerebellum:            Appears normal         Abdomen:                Appears normal
 Posterior Fossa:       Appears normal         Abdominal Wall:         Appears nml (cord
                                                                       insert, abd wall)
 Nuchal Fold:           Not applicable (>20    Cord Vessels:           Appears normal (3
                        wks GA)                                        vessel cord)
 Face:                  Appears normal         Kidneys:                Appear normal
                        (orbits and profile)
 Lips:                  Previously seen        Bladder:                Appears normal
 Thoracic:              Appears normal         Spine:                  Limited views
                                                                       appear normal
 Heart:                 Appears normal         Upper Extremities:      Appears normal
                        (4CH, axis, and
                        situs)
 RVOT:                  Appears normal         Lower Extremities:      Appears normal
 LVOT:                  Appears normal

 Other:  Heels/feet and open hands/5th digits visualized. Nasal bone, lenses,
         maxilla, mandible and falx visualized. Fetus appears to be a male.
         VC, 3VV visualized.
Cervix Uterus Adnexa

 Cervix
 Length:           3.21  cm.
 Normal appearance by transabdominal scan.

 Uterus
 No abnormality visualized.

 Right Ovary
 Not visualized.

 Left Ovary
 Not visualized.
 Cul De Sac
 No free fluid seen.

 Adnexa
 No abnormality visualized.
Impression

 Patient is admitted with diabetic ketoacidosis (DKA).
 Fetal growth is appropriate for gestational age .Amniotic fluid
 is normal and good fetal activity is seen .Fetal anatomical
 survey was completed and appears normal.
Recommendations

 -Fetal growth assessment in 4 weeks. Appointment to be
 made before discharge.
 -Limited ultrasound evaluation next week if she remains an
 inpatient.
                 HOSEIN

## 2022-03-03 MED ORDER — INSULIN GLARGINE-YFGN 100 UNIT/ML ~~LOC~~ SOLN
14.0000 [IU] | Freq: Two times a day (BID) | SUBCUTANEOUS | Status: DC
  Administered 2022-03-03: 14 [IU] via SUBCUTANEOUS
  Filled 2022-03-03 (×3): qty 0.14

## 2022-03-03 MED ORDER — INSULIN ASPART 100 UNIT/ML IJ SOLN
3.0000 [IU] | Freq: Three times a day (TID) | INTRAMUSCULAR | Status: DC
  Administered 2022-03-03 – 2022-03-04 (×3): 3 [IU] via SUBCUTANEOUS

## 2022-03-03 NOTE — Progress Notes (Signed)
Patient ID: Kristin Clay, female   DOB: 12-25-1985, 36 y.o.   MRN: [id number] ?FACULTY PRACTICE ANTEPARTUM(COMPREHENSIVE) NOTE ? ?Kristin Clay is a 36 y.o. [phone] at [redacted]w[redacted]d by midtrimester ultrasound who is admitted for diabetes control with DKA.   ?Fetal presentation is unsure. ?Length of Stay:  5  Days ? ?Subjective: ?Feels better and ate well this morning ?Patient reports the fetal movement as active. ?Patient reports uterine contraction  activity as none. ?Patient reports  vaginal bleeding as none. ?Patient describes fluid per vagina as None. ? ?Vitals:  Blood pressure 128/77, pulse 84, temperature 98.1 ?F (36.7 ?C), temperature source Oral, resp. rate 16, height 5\' 2"  (1.575 m), weight 47.7 kg, last menstrual period 09/20/2021, SpO2 100 %, unknown if currently breastfeeding. ?Physical Examination: ? General appearance - alert, well appearing, and in no distress ?Heart - normal rate and regular rhythm ?Abdomen - soft, nontender, nondistended ?Fundal Height:  size equals dates ?Cervical Exam: Not evaluated.  ?Extremities: extremities normal, atraumatic, no cyanosis or edema and Homans sign is negative, no sign of DVT ?Membranes:intact ? ?Fetal Monitoring:   ?Fetal Heart Rate A   ?Mode Doppler filed at 03/03/2022 05/03/2022  ?Baseline Rate (A) 155 bpm  [155-160] filed at 03/03/2022 0814  ?Variability <5 BPM filed at 02/26/2022 1703  ?Accelerations None filed at 02/26/2022 1703  ?Decelerations None filed at 02/26/2022 1703  ? ? ?Labs:  ?Results for orders placed or performed during the hospital encounter of 02/26/22 (from the past 24 hour(s))  ?Glucose, capillary  ? Collection Time: 03/02/22  3:00 PM  ?Result Value Ref Range  ? Glucose-Capillary 165 (H) 70 - 99 mg/dL  ?Glucose, capillary  ? Collection Time: 03/02/22  6:46 PM  ?Result Value Ref Range  ? Glucose-Capillary 155 (H) 70 - 99 mg/dL  ?Glucose, capillary  ? Collection Time: 03/02/22 11:05 PM  ?Result Value Ref Range  ? Glucose-Capillary 180  (H) 70 - 99 mg/dL  ?Glucose, capillary  ? Collection Time: 03/03/22  5:06 AM  ?Result Value Ref Range  ? Glucose-Capillary 242 (H) 70 - 99 mg/dL  ? ? ? ?Medications:  Scheduled ? aspirin  162 mg Oral Daily  ? docusate sodium  100 mg Oral Daily  ? insulin aspart  0-14 Units Subcutaneous QID  ? insulin aspart  3 Units Subcutaneous TID WC  ? insulin glargine-yfgn  14 Units Subcutaneous BID  ? labetalol  200 mg Oral BID  ? metoCLOPramide  10 mg Oral TID AC  ? NIFEdipine  90 mg Oral Daily  ? potassium chloride  40 mEq Oral BID  ? prenatal multivitamin  1 tablet Oral Q1200  ? scopolamine  1 patch Transdermal Q72H  ? sodium chloride flush  3 mL Intravenous Q12H  ? ?I have reviewed the patient's current medications. ? ?ASSESSMENT: ?Patient Active Problem List  ? Diagnosis Date Noted  ? Hypomagnesemia 03/01/2022  ? Red Chart Rounds 02/26/2022  ? Hypokalemia 01/06/2022  ? Alpha thalassemia silent carrier 01/05/2022  ? DKA, type 1 (HCC) 01/05/2022  ? Atypical chest pain 01/04/2022  ? Blurry vision 01/04/2022  ? 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 Shortness of breath 01/04/2022  ? Proteinuria affecting pregnancy 12/16/2021  ? Chronic hypertension affecting pregnancy 12/15/2021  ? Short interval between pregnancies affecting pregnancy, antepartum 12/15/2021  ? History of severe preeclampsia, prior pregnancy, currently pregnant 12/15/2021  ? Previous preterm delivery at 33w6 due to severe preeclampsia, antepartum 12/15/2021  ? History of forceps delivery  in prior pregnancy, currently pregnant 12/15/2021  ? Type 1 diabetes mellitus affecting pregnancy in second trimester, antepartum 12/11/2021  ? Supervision of high risk pregnancy, antepartum 11/27/2021  ? Rh negative, antepartum 11/27/2021  ? Poor compliance 08/04/2021  ? Hypertension 08/04/2021  ? Language barrier 01/11/2020  ? 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 ? ?PLAN: ?Adjust insulin dose for better control and see how she does now that she is eating  well ? ?Kristin Clay ?03/03/2022,10:34 AM ? ? ? ? ? ? ? ?

## 2022-03-03 NOTE — Progress Notes (Signed)
Inpatient Diabetes Program Recommendations ? ?Diabetes Treatment Program Recommendations ? ?ADA Standards of Care ?Diabetes in Pregnancy Target Glucose Ranges: ? ?Fasting: 70 - 95 mg/dL ?1 hr postprandial: Less than 140mg /dL (from first bite of meal) ?2 hr postprandial: Less than 120 mg/dL (from first bite of meal)   ? ? ? Latest Reference Range & Units 03/02/22 05:45 03/02/22 15:00 03/02/22 18:46 03/02/22 23:05 03/03/22 05:06  ?Glucose-Capillary 70 - 99 mg/dL 05/03/22 (H) 814 (H) 481 (H) 180 (H) 242 (H)  ? ?Review of Glycemic Control ? ?Diabetes history: DM1 ?Outpatient Diabetes medications: Lantus 20 units QAM, 25 units QPM, Novolog 15 units TID ?Current orders for Inpatient glycemic control: Semglee 12 units BID, Novolog 0-14 units QID ?  ?Inpatient Diabetes Program Recommendations:   ?  ?Insulin:  Please consider increasing Semglee to 14 units BID and ordering Novolog 3 units TID with meals for meal coverage if patient eats at least 50% of meals ? ?Thanks, ?856, RN, MSN, CDE ?Diabetes Coordinator ?Inpatient Diabetes Program ?[phone] (Team Pager from 8am to 5pm) ? ?

## 2022-03-04 DIAGNOSIS — Z3A21 21 weeks gestation of pregnancy: Secondary | ICD-10-CM | POA: Diagnosis not present

## 2022-03-04 DIAGNOSIS — E1010 Type 1 diabetes mellitus with ketoacidosis without coma: Secondary | ICD-10-CM | POA: Diagnosis not present

## 2022-03-04 LAB — GLUCOSE, CAPILLARY
Glucose-Capillary: 232 mg/dL — ABNORMAL HIGH (ref 70–99)
Glucose-Capillary: 128 mg/dL — ABNORMAL HIGH (ref 70–99)
Glucose-Capillary: 69 mg/dL — ABNORMAL LOW (ref 70–99)
Glucose-Capillary: 147 mg/dL — ABNORMAL HIGH (ref 70–99)
Glucose-Capillary: 146 mg/dL — ABNORMAL HIGH (ref 70–99)
Glucose-Capillary: 138 mg/dL — ABNORMAL HIGH (ref 70–99)
Glucose-Capillary: 131 mg/dL — ABNORMAL HIGH (ref 70–99)

## 2022-03-04 MED ORDER — INSULIN GLARGINE-YFGN 100 UNIT/ML ~~LOC~~ SOLN
16.0000 [IU] | Freq: Two times a day (BID) | SUBCUTANEOUS | Status: DC
  Administered 2022-03-04 – 2022-03-05 (×3): 16 [IU] via SUBCUTANEOUS
  Filled 2022-03-04 (×4): qty 0.16

## 2022-03-04 MED ORDER — INSULIN ASPART 100 UNIT/ML IJ SOLN
5.0000 [IU] | Freq: Three times a day (TID) | INTRAMUSCULAR | Status: DC
  Administered 2022-03-04 – 2022-03-05 (×3): 5 [IU] via SUBCUTANEOUS

## 2022-03-04 NOTE — Progress Notes (Signed)
1645- Patient received food tray, but not eating at this time. Patient instructed to call for meal coverage insulin when she eats. 1800- Rounding on patient and noted that her meal tray was empty and she was lying quietly on her bed resting. When RN asked what time she ate, she stated that she just finished. CBG done prior to giving meal coverage.  ?

## 2022-03-04 NOTE — Progress Notes (Addendum)
Inpatient Diabetes Program Recommendations ? ?AACE/ADA: New Consensus Statement on Inpatient Glycemic Control  ? ?Target Ranges:  Prepandial:   less than 140 mg/dL ?     Peak postprandial:   less than 180 mg/dL (1-2 hours) ?     Critically ill patients:  140 - 180 mg/dL  ? ? Latest Reference Range & Units 03/04/22 05:12  ?Glucose-Capillary 70 - 99 mg/dL 101 (H)  ? ? Latest Reference Range & Units 03/03/22 05:06 03/03/22 12:16 03/03/22 16:48 03/03/22 20:31  ?Glucose-Capillary 70 - 99 mg/dL 751 (H) 025 (H) 852 (H) 180 (H)  ? ?Review of Glycemic Control ? ?Diabetes history: DM1 ?Outpatient Diabetes medications: Lantus 20 units QAM, 25 units QPM, Novolog 15 units TID ?Current orders for Inpatient glycemic control: Semglee 14 units BID, Novolog 0-14 units QID, Novolog 3 units TID with meals ?  ?Inpatient Diabetes Program Recommendations:   ?  ?Insulin:  Please consider increasing Semglee to 16 units BID and ordering Novolog 5 units TID with meals for meal coverage if patient eats at least 50% of meals ?  ?NOTE: At 7:38, Rinaldo Cloud, RN communicated that patient has D5LR @ 125 ml/hr still infusing. Recommend that D5LR be discontinued if patient is eating adequately. Adjusted insulin recommendations. ?Thanks, ?Orlando Penner, RN, MSN, CDE ?Diabetes Coordinator ?Inpatient Diabetes Program ?[phone] (Team Pager from 8am to 5pm) ? ? ?

## 2022-03-04 NOTE — Progress Notes (Signed)
Patient ID: Kristin Clay, female   DOB: [birth year], 36 y.o.   MRN: [id number] ?FACULTY PRACTICE ANTEPARTUM(COMPREHENSIVE) NOTE ? ?Kristin Clay is a 36 y.o. [phone] at [redacted]w[redacted]d by midtrimester ultrasound who is admitted for diabetes management s/p DKA.   ?Fetal presentation is unsure. ?Length of Stay:  6  Days ? ?Subjective: ?Feels well and eating normally ?Patient reports the fetal movement as active. ?Patient reports uterine contraction  activity as none. ?Patient reports  vaginal bleeding as none. ?Patient describes fluid per vagina as None. ? ?Vitals:  Blood pressure 112/65, pulse 91, temperature 98 ?F (36.7 ?C), temperature source Oral, resp. rate 16, height 5\' 2"  (1.575 m), weight 47.7 kg, last menstrual period 09/20/2021, SpO2 100 %, unknown if currently breastfeeding. ?Physical Examination: ? General appearance - alert, well appearing, and in no distress ?Heart - normal rate and regular rhythm ?Abdomen - soft, nontender, nondistended ?Fundal Height:  size equals dates ?Cervical Exam: Not evaluated. 09/22/2021 ?Extremities: extremities normal, atraumatic, no cyanosis or edema  bilaterally ?Membranes:intact ? ?Fetal Monitoring:   ?Fetal Heart Rate A   ?Mode Doppler filed at 03/03/2022 2140  ?Baseline Rate (A) 152 bpm filed at 03/03/2022 2140  ?Variability <5 BPM filed at 02/26/2022 1703  ?Accelerations None filed at 02/26/2022 1703  ?Decelerations None filed at 02/26/2022 1703  ? ? ?Labs:  ?Results for orders placed or performed during the hospital encounter of 02/26/22 (from the past 24 hour(s))  ?Glucose, capillary  ? Collection Time: 03/03/22 12:16 PM  ?Result Value Ref Range  ? Glucose-Capillary 201 (H) 70 - 99 mg/dL  ?Glucose, capillary  ? Collection Time: 03/03/22  4:48 PM  ?Result Value Ref Range  ? Glucose-Capillary 209 (H) 70 - 99 mg/dL  ?Glucose, capillary  ? Collection Time: 03/03/22  8:31 PM  ?Result Value Ref Range  ? Glucose-Capillary 180 (H) 70 - 99 mg/dL  ?Glucose, capillary  ?  Collection Time: 03/04/22  5:12 AM  ?Result Value Ref Range  ? Glucose-Capillary 232 (H) 70 - 99 mg/dL  ? ? ? ?Medications:  Scheduled ? aspirin  162 mg Oral Daily  ? docusate sodium  100 mg Oral Daily  ? insulin aspart  0-14 Units Subcutaneous QID  ? insulin aspart  5 Units Subcutaneous TID WC  ? insulin glargine-yfgn  16 Units Subcutaneous BID  ? labetalol  200 mg Oral BID  ? metoCLOPramide  10 mg Oral TID AC  ? NIFEdipine  90 mg Oral Daily  ? potassium chloride  40 mEq Oral BID  ? prenatal multivitamin  1 tablet Oral Q1200  ? scopolamine  1 patch Transdermal Q72H  ? sodium chloride flush  3 mL Intravenous Q12H  ? ?I have reviewed the patient's current medications. ? ?ASSESSMENT: ?Patient Active Problem List  ? Diagnosis Date Noted  ? Hypomagnesemia 03/01/2022  ? Red Chart Rounds 02/26/2022  ? Hypokalemia 01/06/2022  ? Alpha thalassemia silent carrier 01/05/2022  ? DKA, type 1 (HCC) 01/05/2022  ? Atypical chest pain 01/04/2022  ? Blurry vision 01/04/2022  ? 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 Shortness of breath 01/04/2022  ? Proteinuria affecting pregnancy 12/16/2021  ? Chronic hypertension affecting pregnancy 12/15/2021  ? Short interval between pregnancies affecting pregnancy, antepartum 12/15/2021  ? History of severe preeclampsia, prior pregnancy, currently pregnant 12/15/2021  ? Previous preterm delivery at 33w6 due to severe preeclampsia, antepartum 12/15/2021  ? History of forceps delivery in prior pregnancy, currently pregnant 12/15/2021  ? Type 1 diabetes  mellitus affecting pregnancy in second trimester, antepartum 12/11/2021  ? Supervision of high risk pregnancy, antepartum 11/27/2021  ? Rh negative, antepartum 11/27/2021  ? Poor compliance 08/04/2021  ? Hypertension 08/04/2021  ? Language barrier 01/11/2020  ? 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 ? ?PLAN: ?Adjusted insulin per recommendations and IV fluids d/c, consider D/C tomorrow ? ?Scheryl Darter ?03/04/2022,9:45 AM ? ? ? ? ? ? ?

## 2022-03-05 ENCOUNTER — Other Ambulatory Visit: Payer: Self-pay | Admitting: Obstetrics & Gynecology

## 2022-03-05 ENCOUNTER — Encounter: Payer: Medicaid Other | Admitting: Family Medicine

## 2022-03-05 ENCOUNTER — Other Ambulatory Visit: Payer: Medicaid Other

## 2022-03-05 DIAGNOSIS — E1010 Type 1 diabetes mellitus with ketoacidosis without coma: Secondary | ICD-10-CM | POA: Diagnosis not present

## 2022-03-05 DIAGNOSIS — Z3A21 21 weeks gestation of pregnancy: Secondary | ICD-10-CM | POA: Diagnosis not present

## 2022-03-05 LAB — BASIC METABOLIC PANEL
Anion gap: 6 (ref 5–15)
BUN: 9 mg/dL (ref 6–20)
CO2: 19 mmol/L — ABNORMAL LOW (ref 22–32)
Calcium: 8.7 mg/dL — ABNORMAL LOW (ref 8.9–10.3)
Chloride: 108 mmol/L (ref 98–111)
Creatinine, Ser: 0.54 mg/dL (ref 0.44–1.00)
GFR, Estimated: >60 mL/min (ref >60)
Glucose, Bld: 180 mg/dL — ABNORMAL HIGH (ref 70–99)
Potassium: 4.1 mmol/L (ref 3.5–5.1)
Sodium: 133 mmol/L — ABNORMAL LOW (ref 135–145)

## 2022-03-05 LAB — GLUCOSE, CAPILLARY
Glucose-Capillary: 227 mg/dL — ABNORMAL HIGH (ref 70–99)
Glucose-Capillary: 202 mg/dL — ABNORMAL HIGH (ref 70–99)

## 2022-03-05 MED ORDER — INSULIN ASPART 100 UNIT/ML IJ SOLN
5.0000 [IU] | Freq: Three times a day (TID) | INTRAMUSCULAR | 11 refills | Status: DC
Start: 2022-03-05 — End: 2022-03-18

## 2022-03-05 MED ORDER — LABETALOL HCL 200 MG PO TABS
200.0000 mg | ORAL_TABLET | Freq: Two times a day (BID) | ORAL | 2 refills | Status: DC
Start: 2022-03-05 — End: 2022-04-13

## 2022-03-05 MED ORDER — NIFEDIPINE ER OSMOTIC RELEASE 90 MG PO TB24: 90.0000 mg | ORAL_TABLET | Freq: Every day | ORAL | 2 refills | Status: DC

## 2022-03-05 MED ORDER — INSULIN GLARGINE-YFGN 100 UNIT/ML ~~LOC~~ SOLN: 16.0000 [IU] | Freq: Two times a day (BID) | SUBCUTANEOUS | 11 refills | Status: DC

## 2022-03-05 NOTE — Progress Notes (Signed)
Discharge instructions given to patient and Spouse with interpreter Tobi Bastos [phone], medications and f/u appointment discussed. Patient stating that she has Lantus at home, informed her that she is to d/c lantus and take her Semglee in its place. Patient also requests refill for her Procardia. Dr Debroah Loop informed and will send in RF order. 1122- F/U appointment rescheduled as follows:  ?Education appointment changed to April 25th 1015 and prenatal appoint/DKA follow up for April 20th 0835. This was relayed to patient and spouse via interpreter [place] #[id number].  ? ?

## 2022-03-05 NOTE — Progress Notes (Signed)
Per tech, when rounding on patient for vitals, Patient was eating meal at 0330. RN measured 2-hours post prandial CBG at 0530. CBG was 227 and patient got 4 units of Novolog. Unable to do a fasting glucose at this time. ?Danie Binder, RN  ?

## 2022-03-05 NOTE — Discharge Summary (Signed)
Patient ID: ?Kristin Clay ?MRN: [id number] ?DOB/AGE: 37/11/1985 37 y.o. ? ?Admit date: 02/26/2022 ?Discharge date: 03/05/2022 ? ?Admission Diagnoses:Type 1 diabetes mellitus with ketoacidosis without coma (HCC) - Plan: Discharge patient ? ?Chronic hypertension affecting pregnancy ? ?Proteinuria affecting pregnancy in second trimester ? ?Hypokalemia ? ?[redacted] weeks gestation of pregnancy ?DKA ? ?Discharge Diagnoses: same ? ?Prenatal Procedures: ultrasound ? ?Consults: Neonatology, Maternal Fetal Medicine ? ?Hospital Course:  ?This is a 36 y.o. [name] with IUP at 6183w6d admitted for diabetic ketoacidosis and elevated blood glucose. She has type 1 DM and chronic hypertension. ?She was admitted with Reports frontal headache x 3 days. Nothing makes better or worse. Hasn't taken anything for her symptoms. Associated symptoms include n/v, blurred vision, and epigastric pain. Took blood pressure medication this morning but immediately vomited. Also took antiemetic this morning but didn't keep it down. States she's vomited more than 10 times today.  ?Hasn't checked her blood sugar or taken insulin in over 3 days - states she's felt too weak to take her insulin. Endorses mid epigastric burning. Marland Kitchen.   ?She received [place] insulin and IVF and low potassium was repleted. Antiemetics were given and she eventually was able to eat. Her Semglee and meal coverage was increased to bring her BG below 200. BP control was adequate on Procardia. Diabetes coordinator was involved in her management. She was deemed stable for discharge to home with outpatient follow up. ?Discharge Exam: ?Temp:  [98 ?F (36.7 ?C)-98.2 ?F (36.8 ?C)] 98.2 ?F (36.8 ?C) (04/13 0759) ?Pulse Rate:  [80-101] 101 (04/13 0759) ?Resp:  [16-20] 16 (04/13 0759) ?BP: (99-124)/(57-73) 113/62 (04/13 0759) ?SpO2:  [100 %] 100 % (04/13 0759) ?Physical Examination: ?CONSTITUTIONAL: Well-developed, well-nourished female in no acute distress.  ?HENT:  Normocephalic, atraumatic,  External right and left ear normal. Oropharynx is clear and moist ?EYES: Conjunctivae and EOM are normal. Pupils are equal, round, and reactive to light. No scleral icterus.  ?NECK: Normal range of motion, supple, no masses ?SKIN: Skin is warm and dry. No rash noted. Not diaphoretic. No erythema. No pallor. ?NEUROLGIC: Alert and oriented to person, place, and time. Normal reflexes, muscle tone coordination. No cranial nerve deficit noted. ?PSYCHIATRIC: Normal mood and affect. Normal behavior. Normal judgment and thought content. ?CARDIOVASCULAR: Normal heart rate noted, regular rhythm ?RESPIRATORY: Effort and breath sounds normal, no problems with respiration noted ?MUSCULOSKELETAL: Normal range of motion. No edema and no tenderness. 2+ distal pulses. ?ABDOMEN: Soft, nontender, nondistended, gravid. ?CERVIX:   ? ?Fetal monitoring: FHR: 150 bpm, Variability: moderate, Accelerations: Present, Decelerations: Absent  ?Uterine activity: none contractions per hour ? ?Significant Diagnostic Studies:  ?Results for orders placed or performed during the hospital encounter of 02/26/22 (from the past 168 hour(s))  ?Glucose, capillary  ? Collection Time: 02/26/22  1:05 PM  ?Result Value Ref Range  ? Glucose-Capillary 292 (H) 70 - 99 mg/dL  ?Urinalysis, Routine w reflex microscopic Urine, Clean Catch  ? Collection Time: 02/26/22  1:10 PM  ?Result Value Ref Range  ? Color, Urine YELLOW YELLOW  ? APPearance HAZY (A) CLEAR  ? Specific Gravity, Urine 1.026 1.005 - 1.030  ? pH 5.0 5.0 - 8.0  ? Glucose, UA >=500 (A) NEGATIVE mg/dL  ? Hgb urine dipstick SMALL (A) NEGATIVE  ? Bilirubin Urine NEGATIVE NEGATIVE  ? Ketones, ur 80 (A) NEGATIVE mg/dL  ? Protein, ur >=300 (A) NEGATIVE mg/dL  ? Nitrite NEGATIVE NEGATIVE  ? Leukocytes,Ua NEGATIVE NEGATIVE  ? RBC / HPF 0-5 0 - 5 RBC/hpf  ?  WBC, UA 0-5 0 - 5 WBC/hpf  ? Bacteria, UA RARE (A) NONE SEEN  ? Squamous Epithelial / LPF 6-10 0 - 5  ? Mucus PRESENT   ? Hyaline Casts, UA PRESENT   ?Protein  / creatinine ratio, urine  ? Collection Time: 02/26/22  1:10 PM  ?Result Value Ref Range  ? Creatinine, Urine 78.95 mg/dL  ? Total Protein, Urine 184 mg/dL  ? Protein Creatinine Ratio 2.33 (H) 0.00 - 0.15 mg/mg[Cre]  ?CBC  ? Collection Time: 02/26/22  1:46 PM  ?Result Value Ref Range  ? WBC 5.6 4.0 - 10.5 K/uL  ? RBC 5.40 (H) 3.87 - 5.11 MIL/uL  ? Hemoglobin 15.4 (H) 12.0 - 15.0 g/dL  ? HCT 46.1 (H) 36.0 - 46.0 %  ? MCV 85.4 80.0 - 100.0 fL  ? MCH 28.5 26.0 - 34.0 pg  ? MCHC 33.4 30.0 - 36.0 g/dL  ? RDW 13.7 11.5 - 15.5 %  ? Platelets 271 150 - 400 K/uL  ? nRBC 0.0 0.0 - 0.2 %  ?Comprehensive metabolic panel  ? Collection Time: 02/26/22  1:46 PM  ?Result Value Ref Range  ? Sodium 140 135 - 145 mmol/L  ? Potassium 3.0 (L) 3.5 - 5.1 mmol/L  ? Chloride 114 (H) 98 - 111 mmol/L  ? CO2 12 (L) 22 - 32 mmol/L  ? Glucose, Bld 301 (H) 70 - 99 mg/dL  ? BUN 9 6 - 20 mg/dL  ? Creatinine, Ser 0.62 0.44 - 1.00 mg/dL  ? Calcium 8.5 (L) 8.9 - 10.3 mg/dL  ? Total Protein 6.6 6.5 - 8.1 g/dL  ? Albumin 2.9 (L) 3.5 - 5.0 g/dL  ? AST 7 (L) 15 - 41 U/L  ? ALT 7 0 - 44 U/L  ? Alkaline Phosphatase 67 38 - 126 U/L  ? Total Bilirubin 1.0 0.3 - 1.2 mg/dL  ? GFR, Estimated >60 >60 mL/min  ? Anion gap 14 5 - 15  ?Beta-hydroxybutyric acid  ? Collection Time: 02/26/22  1:46 PM  ?Result Value Ref Range  ? Beta-Hydroxybutyric Acid 5.99 (H) 0.05 - 0.27 mmol/L  ?Lipase, blood  ? Collection Time: 02/26/22  1:46 PM  ?Result Value Ref Range  ? Lipase 29 11 - 51 U/L  ?Amylase  ? Collection Time: 02/26/22  1:46 PM  ?Result Value Ref Range  ? Amylase 53 28 - 100 U/L  ?Hemoglobin A1c  ? Collection Time: 02/26/22  2:13 PM  ?Result Value Ref Range  ? Hgb A1c MFr Bld 8.4 (H) 4.8 - 5.6 %  ? Mean Plasma Glucose 194.38 mg/dL  ?Glucose, capillary  ? Collection Time: 02/26/22  2:50 PM  ?Result Value Ref Range  ? Glucose-Capillary 227 (H) 70 - 99 mg/dL  ?Glucose, capillary  ? Collection Time: 02/26/22  4:12 PM  ?Result Value Ref Range  ? Glucose-Capillary 124 (H) 70  - 99 mg/dL  ?Glucose, capillary  ? Collection Time: 02/26/22  4:54 PM  ?Result Value Ref Range  ? Glucose-Capillary 111 (H) 70 - 99 mg/dL  ? Comment 1 Notify RN   ?Glucose, capillary  ? Collection Time: 02/26/22  5:56 PM  ?Result Value Ref Range  ? Glucose-Capillary 107 (H) 70 - 99 mg/dL  ?Glucose, capillary  ? Collection Time: 02/26/22  7:22 PM  ?Result Value Ref Range  ? Glucose-Capillary 133 (H) 70 - 99 mg/dL  ?Basic metabolic panel  ? Collection Time: 02/26/22  7:23 PM  ?Result Value Ref Range  ? Sodium 142 135 - 145  mmol/L  ? Potassium 3.4 (L) 3.5 - 5.1 mmol/L  ? Chloride 119 (H) 98 - 111 mmol/L  ? CO2 14 (L) 22 - 32 mmol/L  ? Glucose, Bld 215 (H) 70 - 99 mg/dL  ? BUN 8 6 - 20 mg/dL  ? Creatinine, Ser 0.44 0.44 - 1.00 mg/dL  ? Calcium 7.9 (L) 8.9 - 10.3 mg/dL  ? GFR, Estimated >60 >60 mL/min  ? Anion gap 9 5 - 15  ?Glucose, capillary  ? Collection Time: 02/26/22  8:24 PM  ?Result Value Ref Range  ? Glucose-Capillary 186 (H) 70 - 99 mg/dL  ?Glucose, capillary  ? Collection Time: 02/26/22  9:30 PM  ?Result Value Ref Range  ? Glucose-Capillary 169 (H) 70 - 99 mg/dL  ?Glucose, capillary  ? Collection Time: 02/26/22 10:20 PM  ?Result Value Ref Range  ? Glucose-Capillary 177 (H) 70 - 99 mg/dL  ?Glucose, capillary  ? Collection Time: 02/26/22 11:24 PM  ?Result Value Ref Range  ? Glucose-Capillary 183 (H) 70 - 99 mg/dL  ?Basic metabolic panel  ? Collection Time: 02/26/22 11:37 PM  ?Result Value Ref Range  ? Sodium 140 135 - 145 mmol/L  ? Potassium 2.8 (L) 3.5 - 5.1 mmol/L  ? Chloride 119 (H) 98 - 111 mmol/L  ? CO2 17 (L) 22 - 32 mmol/L  ? Glucose, Bld 195 (H) 70 - 99 mg/dL  ? BUN 6 6 - 20 mg/dL  ? Creatinine, Ser 0.40 (L) 0.44 - 1.00 mg/dL  ? Calcium 7.8 (L) 8.9 - 10.3 mg/dL  ? GFR, Estimated >60 >60 mL/min  ? Anion gap 4 (L) 5 - 15  ?Beta-hydroxybutyric acid  ? Collection Time: 02/26/22 11:37 PM  ?Result Value Ref Range  ? Beta-Hydroxybutyric Acid 0.36 (H) 0.05 - 0.27 mmol/L  ?Glucose, capillary  ? Collection Time:  02/27/22 12:24 AM  ?Result Value Ref Range  ? Glucose-Capillary 146 (H) 70 - 99 mg/dL  ?Glucose, capillary  ? Collection Time: 02/27/22  1:21 AM  ?Result Value Ref Range  ? Glucose-Capillary 149 (H) 70 - 99

## 2022-03-05 NOTE — Telephone Encounter (Addendum)
Called [facility] and spoke with Carollee Herter who advised to call back on Monday 03/09/22 to see if we can get sooner appt.  Also called [facility] at [phone] fax [phone].  Received notification from [facility] that they will call the pt to schedule an appt.   ? ?Altovise Wahler,RN  ?

## 2022-03-05 NOTE — Progress Notes (Addendum)
Inpatient Diabetes Program Recommendations ? ?Diabetes Treatment Program Recommendations ? ?ADA Standards of Care ?Diabetes in Pregnancy Target Glucose Ranges: ? ?Fasting: 70 - 95 mg/dL ?1 hr postprandial: Less than 140mg /dL (from first bite of meal) ?2 hr postprandial: Less than 120 mg/dL (from first bite of meal)   ? ? Latest Reference Range & Units 03/04/22 05:12 03/04/22 10:41 03/04/22 11:51 03/04/22 13:56 03/04/22 16:08 03/04/22 18:08 03/04/22 19:56 03/05/22 05:32  ?Glucose-Capillary 70 - 99 mg/dL 232 (H) 128 (H) 69 (L) 147 (H) 146 (H) 138 (H) 131 (H) 227 (H)  ? ?Review of Glycemic Control ? ?Diabetes history: DM1 ?Outpatient Diabetes medications: Lantus 20 units QAM, 25 units QPM, Novolog 15 units TID ?Current orders for Inpatient glycemic control: Semglee 16 units BID, Novolog 0-14 units QID, Novolog 5 units TID with meals ?  ?Inpatient Diabetes Program Recommendations:   ?  ?Insulin:  Please consider increasing Semglee to 18 units BID and ordering Novolog 6 units TID with meals for meal coverage if patient eats at least 50% of meals.  If patient is eating snacks of at least 15 grams of carbs, please consider ordering Novolog 2 units PRN for snacks of at least 15 grams of carbs. ?  ?Note: Per note by G. Naida Sleight, RN at 5:47 am today,nurse tech noted patient eating meal at 3:30 am today. ? ?Thanks, ?Barnie Alderman, RN, MSN, CDE ?Diabetes Coordinator ?Inpatient Diabetes Program ?[phone] (Team Pager from 8am to 5pm) ? ?

## 2022-03-06 ENCOUNTER — Telehealth: Payer: Self-pay

## 2022-03-06 NOTE — Progress Notes (Signed)
?[facility] Note ? ?03/09/2022 ? ?  ? ?CHIEF COMPLAINT ?Patient presents for Retina Follow Up ? ? ?HISTORY OF PRESENT ILLNESS: ?Kristin Clay is a 36 y.o. female who presents to the clinic today for:  ? ?HPI   ? ? Retina Follow Up   ?Patient presents with  Diabetic Retinopathy.  In both eyes.  This started 3 weeks ago.  I, the attending physician,  performed the HPI with the patient and updated documentation appropriately. ? ?  ?  ? ? Comments   ?Patient here for 3 weeks retina follow up for PDR OU/ PRP OS. Patient states vision doing good. No eye pain.  ? ?  ?  ?Last edited by Bernarda Caffey, MD on 03/09/2022  4:37 PM.  ?  ?Pt missed appt last week due to being admitted for DKA.  ? ?Referring physician: ?Gevena Cotton MD ?[place], [place] [address] ? ?HISTORICAL INFORMATION:  ? ?Selected notes from the [facility] ?Referred by Dr. Frederico Hamman for diabetic retinal evaluation ?LEE:  ?Ocular Hx- ?PMH-pregnancy, DM ?  ? ?CURRENT MEDICATIONS: ?No current outpatient medications on file. (Ophthalmic Drugs)  ? ?No current facility-administered medications for this visit. (Ophthalmic Drugs)  ? ?Current Outpatient Medications (Other)  ?Medication Sig  ? acetaminophen (TYLENOL) 500 MG tablet Take 1,000 mg by mouth every 6 (six) hours as needed.  ? aspirin (ASPIRIN LOW DOSE) 81 MG chewable tablet CHEW 1 TABLET BY MOUTH DAILY.  ? Blood Glucose Monitoring Suppl (ACCU-CHEK GUIDE) w/Device KIT use as directed 4 (four) times daily.  ? Continuous Blood Gluc Transmit (DEXCOM G6 TRANSMITTER) MISC 1 Device by Does not apply route every 3 (three) months.  ? cyclobenzaprine (FLEXERIL) 10 MG tablet Take 0.5-1 tablets (5-10 mg total) by mouth 3 (three) times daily as needed for muscle spasms.  ? glucose blood (ACCU-CHEK GUIDE) test strip Use as instructed; check blood glucose 4 times daily  ? insulin aspart (NOVOLOG) 100 UNIT/ML injection Inject 5 Units into the skin 3  (three) times daily with meals.  ? insulin glargine-yfgn (SEMGLEE) 100 UNIT/ML injection Inject 0.16 mLs (16 Units total) into the skin 2 (two) times daily.  ? labetalol (NORMODYNE) 100 MG tablet Take 1 tablet (100 mg total) by mouth 2 (two) times daily.  ? labetalol (NORMODYNE) 200 MG tablet Take 1 tablet (200 mg total) by mouth 2 (two) times daily.  ? metoCLOPramide (REGLAN) 10 MG tablet Take 1 tablet (10 mg total) by mouth 3 (three) times daily before meals.  ? NIFEdipine (ADALAT CC) 90 MG 24 hr tablet Take 1 tablet (90 mg total) by mouth daily.  ? NIFEdipine (PROCARDIA XL/NIFEDICAL-XL) 90 MG 24 hr tablet Take 1 tablet (90 mg total) by mouth daily.  ? ondansetron (ZOFRAN-ODT) 4 MG disintegrating tablet Take 1 tablet (4 mg total) by mouth every 6 (six) hours as needed for nausea.  ? Prenatal Vit-Fe Fumarate-FA (PRENATAL VITAMIN) 27-0.8 MG TABS Take 1 tablet by mouth daily.  ? prochlorperazine (COMPAZINE) 10 MG tablet Take 1 tablet (10 mg total) by mouth 2 (two) times daily as needed for nausea or vomiting.  ? scopolamine (TRANSDERM-SCOP) 1 MG/3DAYS Place 1 patch (1.5 mg total) onto the skin every 3 (three) days.  ? Accu-Chek Softclix Lancets lancets Use as instructed; check blood glucose 4 times daily  ? calcium carbonate (TUMS) 500 MG chewable tablet Chew 1 tablet (200 mg of elemental calcium total) by mouth 2 (two) times  daily as needed for indigestion or heartburn. (Patient not taking: Reported on 02/09/2022)  ? potassium chloride SA (KLOR-CON M) 20 MEQ tablet Take 2 tablets (40 mEq total) by mouth daily for 3 days.  ? promethazine (PHENERGAN) 25 MG suppository Place 1 suppository (25 mg total) rectally every 6 (six) hours as needed for nausea or vomiting. (Patient not taking: Reported on 03/09/2022)  ? ?No current facility-administered medications for this visit. (Other)  ? ?REVIEW OF SYSTEMS: ?ROS   ?Positive for: Endocrine, Eyes ?Negative for: Constitutional, Gastrointestinal, Neurological, Skin,  Genitourinary, Musculoskeletal, HENT, Cardiovascular, Respiratory, Psychiatric, Allergic/Imm, Heme/Lymph ?Last edited by Theodore Demark, COA on 03/09/2022  2:22 PM.  ?  ? ?ALLERGIES ?Allergies  ?Allergen Reactions  ? Beef-Derived Products   ? Chicken Protein   ? Pork-Derived Products   ? ?PAST MEDICAL HISTORY ?Past Medical History:  ?Diagnosis Date  ? Chronic hypertension   ? Complication of anesthesia   ? with hand surgery, local anes did not work, tried twice- had to put her to sleep  ? Diabetic retinopathy ([place])   ? DKA (diabetic ketoacidosis) ([place]) 10/2020  ? Epidural hematoma ([place]) 07/04/2021  ? Obstetric vaginal laceration with type 3c third degree perineal laceration 06/29/2021  ? Pyelonephritis 10/2020  ? Rh negative status during pregnancy 05/01/2021  ? s/p Rhogam 05/13/21  ? Type I diabetes mellitus ([place])   ? dx 82yr ago  ? UTI (urinary tract infection)   ? ?Past Surgical History:  ?Procedure Laterality Date  ? female circumcision Bilateral   ? clitorectomy as well  ? FOOT SURGERY  2018  ? HAND SURGERY  2019  ? ?FAMILY HISTORY ?Family History  ?Problem Relation Age of Onset  ? Hyperlipidemia Mother   ? Hypertension Mother   ? Kidney disease Father   ? Alzheimer's disease Father   ? ?SOCIAL HISTORY ?Social History  ? ?Tobacco Use  ? Smoking status: Never  ? Smokeless tobacco: Never  ?Vaping Use  ? Vaping Use: Never used  ?Substance Use Topics  ? Alcohol use: Never  ? Drug use: Never  ?  ? ?  ?OPHTHALMIC EXAM: ? ?Base Eye Exam   ? ? Visual Acuity (Snellen - Linear)   ? ?   Right Left  ? Dist [place] 20/30 20/20 -2  ? Dist ph Oval 20/25 -2   ? ?  ?  ? ? Tonometry (Tonopen, 2:20 PM)   ? ?   Right Left  ? Pressure 11 12  ? ?  ?  ? ? Pupils   ? ?   Dark Light Shape React APD  ? Right 3 2 Round Brisk None  ? Left 3 2 Round Brisk None  ? ?  ?  ? ? Visual Fields   ? ?   Left Right  ? Restrictions Partial outer inferior temporal deficiency Partial outer superior temporal, superior nasal deficiencies  ? ?  ?  ? ?  Extraocular Movement   ? ?   Right Left  ?  Full, Ortho Full, Ortho  ? ?  ?  ? ? Neuro/Psych   ? ? Oriented x3: Yes  ? Mood/Affect: Normal  ? ?  ?  ? ? Dilation   ? ? Both eyes: 1.0% Mydriacyl, 2.5% Phenylephrine @ 2:19 PM  ? ?  ?  ? ?  ? ?Slit Lamp and Fundus Exam   ? ? Slit Lamp Exam   ? ?   Right Left  ? Lids/Lashes normal normal  ? Conjunctiva/Sclera  mild melanosis meld milanosis  ? Cornea trace tear film debris trace tear film debris  ? Anterior Chamber deep and quiet deep and quiet  ? Iris round and dilated, no NVI round and dilated, no NVI  ? Lens Clear Clear  ? Anterior Vitreous syneresis syneresis  ? ?  ?  ? ? Fundus Exam   ? ?   Right Left  ? Disc pink and sharp; compact; mild hyperemia; +fibrosis w/ early, fine NVD; +PPP pink and sharp; compact; +fibrosis w/ early, fine NVD  ? C/D Ratio 0.2 0.2  ? Macula blunted foveal reflex, scattered MA/DBH, exudates, +CWS, fine NVE inferior macula good foveal reflex, ERM with stria, scattered MA/DBH, exudates  ? Vessels attenuated, scattered early fine NVE attenuated, copper wiring, early fine NVE--scattered  ? Periphery attached, 360 MA/DBH attached, 360 MA/DBH  ? ?  ?  ? ?  ? ? ?IMAGING AND PROCEDURES  ?Imaging and Procedures for 03/09/2022 ? ?OCT, Retina - OU - Both Eyes   ? ?   ?Right Eye ?Quality was good. Central Foveal Thickness: 274. Progression has been stable. Findings include intraretinal hyper-reflective material, vitreomacular adhesion , abnormal foveal contour, preretinal fibrosis, intraretinal fluid, no SRF (Scattered IRF/IRHM, greatest inferior macula, +PRF overlying disc).  ? ?Left Eye ?Quality was good. Central Foveal Thickness: 291. Progression has been stable. Findings include no IRF, no SRF, preretinal fibrosis, macular pucker, vitreous traction, epiretinal membrane, abnormal foveal contour (Pre retinal fibrosis overlying disc extending to macula w/ mild traction).  ? ?Notes ?*Images captured and stored on drive ? ?Diagnosis / Impression:  ?PDR w/  DME OU ?OD: Scattered IRF/IRHM, greatest inferior macula, +PRF overlying disc ?OS: +PRF overlying disc and extending to macula w/ mild traction ? ?Clinical management:  ?See below ? ?Abbreviations: NFP - Normal foveal pro

## 2022-03-06 NOTE — Telephone Encounter (Signed)
Transition Care Management Unsuccessful Follow-up Telephone Call ? ?Date of discharge and from where:  03/05/2022-[facility]  ? ?Attempts:  1st Attempt ? ?Reason for unsuccessful TCM follow-up call:  Unable to reach patient ? ?  ?

## 2022-03-09 ENCOUNTER — Ambulatory Visit (INDEPENDENT_AMBULATORY_CARE_PROVIDER_SITE_OTHER): Payer: Medicaid Other | Admitting: Ophthalmology

## 2022-03-09 ENCOUNTER — Encounter (INDEPENDENT_AMBULATORY_CARE_PROVIDER_SITE_OTHER): Payer: Self-pay | Admitting: Ophthalmology

## 2022-03-09 DIAGNOSIS — O0992 Supervision of high risk pregnancy, unspecified, second trimester: Secondary | ICD-10-CM

## 2022-03-09 DIAGNOSIS — H35373 Puckering of macula, bilateral: Secondary | ICD-10-CM | POA: Diagnosis not present

## 2022-03-09 DIAGNOSIS — E113513 Type 2 diabetes mellitus with proliferative diabetic retinopathy with macular edema, bilateral: Secondary | ICD-10-CM

## 2022-03-09 DIAGNOSIS — I10 Essential (primary) hypertension: Secondary | ICD-10-CM

## 2022-03-09 DIAGNOSIS — H35033 Hypertensive retinopathy, bilateral: Secondary | ICD-10-CM | POA: Diagnosis not present

## 2022-03-10 NOTE — Telephone Encounter (Signed)
Transition Care Management Unsuccessful Follow-up Telephone Call ? ?Date of discharge and from where:  03/05/2022-[facility]  ? ?Attempts:  2nd Attempt ? ?Reason for unsuccessful TCM follow-up call:  Unable to reach patient ? ?  ?

## 2022-03-11 NOTE — Telephone Encounter (Signed)
Transition Care Management Unsuccessful Follow-up Telephone Call ? ?Date of discharge and from where:  03/05/2022-[facility]  ? ?Attempts:  3rd Attempt ? ?Reason for unsuccessful TCM follow-up call:  Unable to reach patient ? ?  ?

## 2022-03-12 ENCOUNTER — Ambulatory Visit (INDEPENDENT_AMBULATORY_CARE_PROVIDER_SITE_OTHER): Payer: Medicaid Other | Admitting: Obstetrics & Gynecology

## 2022-03-12 VITALS — BP 119/81 | HR 89 | Wt 113.9 lb

## 2022-03-12 DIAGNOSIS — O1212 Gestational proteinuria, second trimester: Secondary | ICD-10-CM

## 2022-03-12 DIAGNOSIS — O24012 Pre-existing diabetes mellitus, type 1, in pregnancy, second trimester: Secondary | ICD-10-CM

## 2022-03-12 DIAGNOSIS — O09219 Supervision of pregnancy with history of pre-term labor, unspecified trimester: Secondary | ICD-10-CM

## 2022-03-12 DIAGNOSIS — O0990 Supervision of high risk pregnancy, unspecified, unspecified trimester: Secondary | ICD-10-CM

## 2022-03-12 DIAGNOSIS — I10 Essential (primary) hypertension: Secondary | ICD-10-CM

## 2022-03-12 LAB — POCT URINALYSIS DIP (DEVICE)
Bilirubin Urine: NEGATIVE
Glucose, UA: 500 mg/dL — AB
Ketones, ur: NEGATIVE mg/dL
Nitrite: NEGATIVE
Protein, ur: 100 mg/dL — AB
Specific Gravity, Urine: >=1.03 (ref 1.005–1.030)
Urobilinogen, UA: 1 mg/dL (ref 0.0–1.0)
pH: 6 (ref 5.0–8.0)

## 2022-03-12 MED ORDER — INSULIN GLARGINE 100 UNIT/ML SOLOSTAR PEN: 18.0000 [IU] | PEN_INJECTOR | Freq: Two times a day (BID) | SUBCUTANEOUS | 11 refills | Status: DC

## 2022-03-12 MED ORDER — PANTOPRAZOLE SODIUM 20 MG PO TBEC: 40.0000 mg | DELAYED_RELEASE_TABLET | Freq: Every day | ORAL | 5 refills | Status: DC

## 2022-03-12 NOTE — Progress Notes (Signed)
? ?  PRENATAL VISIT NOTE ? ?Subjective:  ?Kristin Clay is a 36 y.o. [id number] at [redacted]w[redacted]d being seen today for ongoing prenatal care.  She is currently monitored for the following issues for this high-risk pregnancy and has Female circumcision; Language barrier; Poor compliance; Hypertension; Supervision of high risk pregnancy, antepartum; Rh negative, antepartum; Type 1 diabetes mellitus affecting pregnancy in second trimester, antepartum; Chronic hypertension affecting pregnancy; Short interval between pregnancies affecting pregnancy, antepartum; History of severe preeclampsia, prior pregnancy, currently pregnant; Previous preterm delivery at 33w6 due to severe preeclampsia, antepartum; History of forceps delivery in prior pregnancy, currently pregnant; Proteinuria affecting pregnancy; Atypical chest pain; Blurry vision; DM type 1 causing eye disease ([place]); Shortness of breath; Alpha thalassemia silent carrier; DKA, type 1 ([place]); Hypokalemia; Red Chart Rounds; and Hypomagnesemia on their problem list. ? ?Patient reports heartburn and nausea.  Contractions: Not present. Vag. Bleeding: None.  Movement: Present. Denies leaking of fluid.  ? ?The following portions of the patient's history were reviewed and updated as appropriate: allergies, current medications, past family history, past medical history, past social history, past surgical history and problem list.  ? ?Objective:  ? ?Vitals:  ? 03/12/22 0925  ?BP: 119/81  ?Pulse: 89  ?Weight: 113 lb 14.4 oz (51.7 kg)  ? ? ?Fetal Status: Fetal Heart Rate (bpm): 147   Movement: Present    ? ?General:  Alert, oriented and cooperative. Patient is in no acute distress.  ?Skin: Skin is warm and dry. No rash noted.   ?Cardiovascular: Normal heart rate noted  ?Respiratory: Normal respiratory effort, no problems with respiration noted  ?Abdomen: Soft, gravid, appropriate for gestational age.  Pain/Pressure: Absent     ?Pelvic: Cervical exam deferred        ?Extremities:  Normal range of motion.  Edema: None  ?Mental Status: Normal mood and affect. Normal behavior. Normal judgment and thought content.  ? ?Assessment and Plan:  ?Pregnancy: [id number] at [redacted]w[redacted]d ?1. Supervision of high risk pregnancy, antepartum ?Heartburn and nausea, continue Reglan ?- [place] MFM OB FOLLOW UP; Future ?- pantoprazole (PROTONIX) 20 MG tablet; Take 2 tablets (40 mg total) by mouth daily.  Dispense: 30 tablet; Refill: 5 ? ?2. Previous preterm delivery at 33w6 due to severe preeclampsia, antepartum ? ? ?3. Type 1 diabetes mellitus affecting pregnancy in second trimester, antepartum ?Increase Lantus ?- insulin glargine (LANTUS) 100 UNIT/ML Solostar Pen; Inject 18 Units into the skin 2 (two) times daily.  Dispense: 15 mL; Refill: 11 ? ?4. Primary hypertension ? ? ?5. Proteinuria affecting pregnancy in second trimester ?Pr/cr 800 ? ?Preterm labor symptoms and general obstetric precautions including but not limited to vaginal bleeding, contractions, leaking of fluid and fetal movement were reviewed in detail with the patient. ?Please refer to After Visit Summary for other counseling recommendations.  ? ?Return in about 2 weeks (around 03/26/2022). ? ?Future Appointments  ?Date Time Provider [place]  ?03/17/2022 10:15 AM [facility]  ?03/18/2022  7:30 AM Shamleffer, Melanie Crazier, MD [facility] None  ?03/27/2022  2:35 PM Tobb, Godfrey Pick, DO [facility] None  ?04/06/2022  9:30 AM Bernarda Caffey, MD [facility] None  ?04/13/2022  8:15 AM Griffin Basil, MD [facility]  ?04/13/2022  8:50 AM [facility] LAB [facility]  ?05/04/2022  9:15 AM Aletha Halim, MD [facility]  ?05/18/2022  9:15 AM Aletha Halim, MD [facility]  ? ? ?Emeterio Reeve, MD ?

## 2022-03-17 ENCOUNTER — Ambulatory Visit (INDEPENDENT_AMBULATORY_CARE_PROVIDER_SITE_OTHER): Payer: Medicaid Other | Admitting: Registered"

## 2022-03-17 ENCOUNTER — Encounter: Payer: Medicaid Other | Attending: Obstetrics & Gynecology | Admitting: Registered"

## 2022-03-17 DIAGNOSIS — O24019 Pre-existing diabetes mellitus, type 1, in pregnancy, unspecified trimester: Secondary | ICD-10-CM | POA: Diagnosis not present

## 2022-03-17 DIAGNOSIS — O0990 Supervision of high risk pregnancy, unspecified, unspecified trimester: Secondary | ICD-10-CM | POA: Diagnosis present

## 2022-03-17 DIAGNOSIS — O09529 Supervision of elderly multigravida, unspecified trimester: Secondary | ICD-10-CM | POA: Diagnosis not present

## 2022-03-17 DIAGNOSIS — O24012 Pre-existing diabetes mellitus, type 1, in pregnancy, second trimester: Secondary | ICD-10-CM

## 2022-03-17 DIAGNOSIS — Z3A00 Weeks of gestation of pregnancy not specified: Secondary | ICD-10-CM | POA: Diagnosis not present

## 2022-03-17 DIAGNOSIS — Z713 Dietary counseling and surveillance: Secondary | ICD-10-CM | POA: Diagnosis not present

## 2022-03-17 NOTE — Progress Notes (Signed)
?Name: Kristin Clay  ?MRN/ DOB: [id number], [birth year]   ?Age/ Sex: 36 y.o., female   ? ?PCP: Dorna Mai, MD   ?Reason for Endocrinology Evaluation: Type 1 Diabetes Mellitus  ?   ?Date of Initial Endocrinology Visit: 03/18/2022   ? ? ?PATIENT IDENTIFIER: Kristin Clay is a 36 y.o. female with a past medical history of T1 DM. The patient presented for initial endocrinology clinic visit on 03/18/2022 for consultative assistance with her diabetes management.  ? ? ?HPI: ?Ms. Wojnarowski was  ? ? ?Diagnosed with T1DM at age 15  ?Prior Medications tried/Intolerance: as listed  ?Currently checking blood sugars multiple  x / day,  through Dexcom   ?Hypoglycemia episodes : no               ?Hemoglobin A1c has ranged from 8.4% in 2023, peaking at 13.0% in 2020. ?Patient has required hospitalization within the last 1 year from hyper or hypoglycemia: Yes 02/2022 with DKA ? ?She is currently 24.5 weeks of gestation with a boy  ?She has 81 months old baby  ? ?Paternal aunts with T1 diabetes  ? ?Pt with tingling of hands and feet, she has partial ring finger amputations B/L  ? ?HOME DIABETES REGIMEN: ?Lantus 20 units QAM and 25 units QPM  ?Novolog 12 units TID QAC ? ? ? ?GLUCOSE LOG: ? ? fasting 2 hrs  2 hrs after lunch 2 hrs after supper  ?03/15/2022 210 170 180 200  ?4/24 230 200 195 185  ?4/25 210 170 310 252  ?4/26 280     ? ? ? ?DIABETIC COMPLICATIONS: ?Microvascular complications:  ?Left eye retinopathy (S/P laser treatment )  ?Denies: CKD  ?Last eye exam: Completed 01/2021 ? ?Macrovascular complications:  ? ?Denies: CAD, PVD, CVA ? ? ?PAST HISTORY: ?Past Medical History:  ?Past Medical History:  ?Diagnosis Date  ? Chronic hypertension   ? Complication of anesthesia   ? with hand surgery, local anes did not work, tried twice- had to put her to sleep  ? Diabetic retinopathy ([place])   ? DKA (diabetic ketoacidosis) ([place]) 10/2020  ? Epidural hematoma ([place]) 07/04/2021  ? Obstetric vaginal laceration with type  3c third degree perineal laceration 06/29/2021  ? Pyelonephritis 10/2020  ? Rh negative status during pregnancy 05/01/2021  ? s/p Rhogam 05/13/21  ? Type I diabetes mellitus ([place])   ? dx 87yr ago  ? UTI (urinary tract infection)   ? ?Past Surgical History:  ?Past Surgical History:  ?Procedure Laterality Date  ? female circumcision Bilateral   ? clitorectomy as well  ? FOOT SURGERY  2018  ? HAND SURGERY  2019  ?  ?Social History:  reports that she has never smoked. She has never used smokeless tobacco. She reports that she does not drink alcohol and does not use drugs. ?Family History:  ?Family History  ?Problem Relation Age of Onset  ? Hyperlipidemia Mother   ? Hypertension Mother   ? Kidney disease Father   ? Alzheimer's disease Father   ? ? ? ?HOME MEDICATIONS: ?Allergies as of 03/18/2022   ? ?   Reactions  ? Pork-derived Products   ? ?  ? ?  ?Medication List  ?  ? ?  ? Accurate as of March 18, 2022  4:17 PM. If you have any questions, ask your nurse or doctor.  ?  ?  ? ?  ? ?STOP taking these medications   ? ?calcium carbonate 500 MG chewable tablet ?Commonly  known as: Tums ?Stopped by: Dorita Sciara, MD ?  ?cyclobenzaprine 10 MG tablet ?Commonly known as: FLEXERIL ?Stopped by: Dorita Sciara, MD ?  ? ?  ? ?TAKE these medications   ? ?Accu-Chek Guide test strip ?Generic drug: glucose blood ?Use as instructed; check blood glucose 4 times daily ?  ?Accu-Chek Guide w/Device Kit ?use as directed 4 (four) times daily. ?  ?Accu-Chek Softclix Lancets lancets ?Use as instructed; check blood glucose 4 times daily ?  ?acetaminophen 500 MG tablet ?Commonly known as: TYLENOL ?Take 1,000 mg by mouth every 6 (six) hours as needed. ?  ?aspirin 81 MG chewable tablet ?Commonly known as: Aspirin Low Dose ?CHEW 1 TABLET BY MOUTH DAILY. ?  ?Dexcom G6 Transmitter Misc ?1 Device by Does not apply route every 3 (three) months. ?  ?insulin aspart 100 UNIT/ML injection ?Commonly known as: novoLOG ?Inject 5 Units into the skin  3 (three) times daily with meals. ?  ?insulin glargine 100 UNIT/ML Solostar Pen ?Commonly known as: LANTUS ?Max daily 55 units ?What changed:  ?how much to take ?how to take this ?when to take this ?additional instructions ?Changed by: Dorita Sciara, MD ?  ?labetalol 200 MG tablet ?Commonly known as: NORMODYNE ?Take 1 tablet (200 mg total) by mouth 2 (two) times daily. ?  ?metoCLOPramide 10 MG tablet ?Commonly known as: Reglan ?Take 1 tablet (10 mg total) by mouth 3 (three) times daily before meals. ?  ?NIFEdipine 90 MG 24 hr tablet ?Commonly known as: PROCARDIA XL/NIFEDICAL-XL ?Take 1 tablet (90 mg total) by mouth daily. ?  ?Omnipod 5 G6 Intro (Gen 5) Kit ?1 Device by Does not apply route every 3 (three) days. ?Started by: Dorita Sciara, MD ?  ?Omnipod 5 G6 Pod (Gen 5) Misc ?1 Device by Does not apply route every 3 (three) days. ?Started by: Dorita Sciara, MD ?  ?ondansetron 4 MG disintegrating tablet ?Commonly known as: ZOFRAN-ODT ?Take 1 tablet (4 mg total) by mouth every 6 (six) hours as needed for nausea. ?  ?pantoprazole 20 MG tablet ?Commonly known as: Protonix ?Take 2 tablets (40 mg total) by mouth daily. ?  ?potassium chloride SA 20 MEQ tablet ?Commonly known as: KLOR-CON M ?Take 2 tablets (40 mEq total) by mouth daily for 3 days. ?  ?Prenatal Vitamin 27-0.8 MG Tabs ?Take 1 tablet by mouth daily. ?  ?prochlorperazine 10 MG tablet ?Commonly known as: COMPAZINE ?Take 1 tablet (10 mg total) by mouth 2 (two) times daily as needed for nausea or vomiting. ?  ?promethazine 25 MG suppository ?Commonly known as: PHENERGAN ?Place 1 suppository (25 mg total) rectally every 6 (six) hours as needed for nausea or vomiting. ?  ?Transderm-Scop 1 MG/3DAYS ?Generic drug: scopolamine ?Place 1 patch (1.5 mg total) onto the skin every 3 (three) days. ?  ? ?  ? ? ? ?ALLERGIES: ?Allergies  ?Allergen Reactions  ? Pork-Derived Products   ? ? ? ?REVIEW OF SYSTEMS: ?A comprehensive ROS was conducted with the  patient and is negative except as per HPI and below:  ?ROS ? ?  ?OBJECTIVE:  ? ?VITAL SIGNS: BP 130/80 (BP Location: Left Arm, Patient Position: Sitting, Cuff Size: Small)   Pulse 86   Ht _0  (1.575 m)   Wt 115 lb 6.4 oz (52.3 kg)   LMP 09/20/2021   SpO2 99%   BMI 21.11 kg/m?   ? ?PHYSICAL EXAM:  ?General: Pt appears well and is in NAD  ?Neck: General: Supple without adenopathy  or carotid bruits. ?Thyroid: Thyroid size normal.  No goiter or nodules appreciated. No thyroid bruit.  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  ?Neuro: MS is good with appropriate affect, pt is alert and Ox3  ? ? ?DM foot exam: 03/18/2022 ? ?The skin of the feet is intact without sores or ulcerations. ?The pedal pulses are 2+ on right and 2+ on left. ?The sensation is decreased to a screening 5.07, 10 gram monofilament bilaterally ? ? ?DATA REVIEWED: ? ?Lab Results  ?Component Value Date  ? HGBA1C 8.4 (H) 02/26/2022  ? HGBA1C 10.4 (H) 12/15/2021  ? HGBA1C 10.4 (A) 12/09/2021  ? ?Lab Results  ?Component Value Date  ? LDLCALC 129 (H) 10/29/2019  ? CREATININE 0.54 03/05/2022  ? ?No results found for: MICRALBCREAT ? ?Lab Results  ?Component Value Date  ? CHOL 179 10/29/2019  ? HDL 35 (L) 10/29/2019  ? LDLCALC 129 (H) 10/29/2019  ? TRIG 77 10/29/2019  ? CHOLHDL 5.1 10/29/2019  ?     ? ?ASSESSMENT / PLAN / RECOMMENDATIONS:  ? ?1) Type 1 Diabetes Mellitus, Poorly controlled, Withneuropathic, and retinopathic and neuropathic complications - Most recent A1c of  8.4%. Goal A1c < 6.5 %.   ? ?Plan: ?GENERAL: ?I have discussed with the patient the pathophysiology of diabetes. We went over the natural progression of the disease. We stressed the importance of  lifestyle changes . I explained the complications associated with diabetes including retinopathy, nephropathy, neuropathy as well as increased risk of cardiovascular disease. We went over the benefit seen with glycem

## 2022-03-17 NOTE — Progress Notes (Signed)
In-person [place] contract interpreter Salma ? ?Patient would like to start using an insulin pump as well. Patient has appt with her Endocrinologist tomorrow. Asked patient to talk to her endocrinologist tomorrow about pumps and if Cristy Folks would be able to assess which type of pump would be most appropriate with her language barrier and the technology not supported for Arabic. ? ?CGM Training - Dexcom G6 ? ?Start time:1030    End time: 1125 ?Total time: 55 min ? ?[x]   Set up Receiver ?[x]   Getting to know device: ?Sensor:  ?2 hour warmup for new sensor/transmitter ?Waterproof ?Receiver: Phone vs reader.  ?[x]   Setting up device (high alert  180 , low alert 65) ?[x]   Setting alert profile: ?[x]   Inserting sensor: ?After application it takes 2 hr initial warm up before first reading.  ?[]   Calibrating  ?[x]   Ending sensor session ?[x]   Trouble shooting:  ?[x]   Tape guide:  ?[x]   Insulin dosing from CGM readings.  ? ?Patient has Dexcom tech support and my contact information.  ?CGM visit ?

## 2022-03-18 ENCOUNTER — Encounter: Payer: Self-pay | Admitting: Internal Medicine

## 2022-03-18 ENCOUNTER — Ambulatory Visit (INDEPENDENT_AMBULATORY_CARE_PROVIDER_SITE_OTHER): Payer: Medicaid Other | Admitting: Internal Medicine

## 2022-03-18 VITALS — BP 130/80 | HR 86 | Ht 62.0 in | Wt 115.4 lb

## 2022-03-18 DIAGNOSIS — E1065 Type 1 diabetes mellitus with hyperglycemia: Secondary | ICD-10-CM | POA: Diagnosis not present

## 2022-03-18 DIAGNOSIS — E1042 Type 1 diabetes mellitus with diabetic polyneuropathy: Secondary | ICD-10-CM

## 2022-03-18 DIAGNOSIS — O24012 Pre-existing diabetes mellitus, type 1, in pregnancy, second trimester: Secondary | ICD-10-CM | POA: Diagnosis not present

## 2022-03-18 DIAGNOSIS — E103592 Type 1 diabetes mellitus with proliferative diabetic retinopathy without macular edema, left eye: Secondary | ICD-10-CM | POA: Diagnosis not present

## 2022-03-18 MED ORDER — OMNIPOD 5 DEXG7G6 PODS GEN 5 MISC: 1.0000 | 3 refills | Status: DC

## 2022-03-18 MED ORDER — INSULIN PEN NEEDLE 32G X 4 MM MISC: 1.0000 | Freq: Four times a day (QID) | 11 refills | Status: DC

## 2022-03-18 MED ORDER — OMNIPOD 5 DEXG7G6 INTRO GEN 5 KIT: 1.0000 | PACK | 0 refills | Status: DC

## 2022-03-18 MED ORDER — INSULIN ASPART 100 UNIT/ML IJ SOLN: INTRAMUSCULAR | 11 refills | Status: DC

## 2022-03-18 MED ORDER — INSULIN GLARGINE 100 UNIT/ML SOLOSTAR PEN: PEN_INJECTOR | SUBCUTANEOUS | 6 refills | Status: DC

## 2022-03-18 NOTE — Patient Instructions (Addendum)
Lantus 25 units in the morning and 30 units in the afternoon  ?Novolog 15 units with each meal  ?Novolog correctional insulin: ADD extra units on insulin to your meal-time Novolog dose if your blood sugars are higher than 130. Use the scale below to help guide you:  ? ?Blood sugar before meal Number of units to inject  ?Less than 130 0 unit  ?131 -  160 1 units  ?161 -  190 2 units  ?191 -  225 3 units  ?226 -  250 4 units  ?251 -  280 5 units  ?281 -  310 6 units  ?311 -  340 7 units  ?341 -  370 8 units  ?371- 400 9 units   ? ? ?Lantus 25 ???? ?? ?????? ? 30 ???? ?? ???? ?? ??? ????? ???????? 15 ???? ?? ?? ???? ?????????? ???????? ?? Novolog: ??? ????? ?????? ?? ????????? ??? ???? ??? ?????? ?? Novolog ??? ???? ???? ????? ?? ???? ???? ???? ?? 130. ?????? ??????? ????? ???????? ?? ??????: ? ???? ???? ??? ?????? ??? ??????? ?????? ????? ???? ?? 130 0 ???? ?131-160 1 ???? ?161 - 190 2 ???? ?191-225 3 ????? ?226-250 4 ????? ?251 - 280 5 ????? ?281 - 310 6 ????? ?311-340 7 ????? ?341-370 8 ????? ?371-400 9 ????? ? ? ? ? ? ?HOW TO TREAT LOW BLOOD SUGARS (Blood sugar LESS THAN 70 MG/DL) ?Please follow the RULE OF 15 for the treatment of hypoglycemia treatment (when your (blood sugars are less than 70 mg/dL)  ? ?STEP 1: Take 15 grams of carbohydrates when your blood sugar is low, which includes:  ?3-4 GLUCOSE TABS  OR ?3-4 OZ OF JUICE OR REGULAR SODA OR ?ONE TUBE OF GLUCOSE GEL   ? ?STEP 2: RECHECK blood sugar in 15 MINUTES ?STEP 3: If your blood sugar is still low at the 15 minute recheck --> then, go back to STEP 1 and treat AGAIN with another 15 grams of carbohydrates. ? ?

## 2022-03-26 ENCOUNTER — Ambulatory Visit (INDEPENDENT_AMBULATORY_CARE_PROVIDER_SITE_OTHER): Payer: Medicaid Other | Admitting: Registered"

## 2022-03-26 ENCOUNTER — Encounter: Payer: Medicaid Other | Attending: Obstetrics & Gynecology | Admitting: Registered"

## 2022-03-26 DIAGNOSIS — O24019 Pre-existing diabetes mellitus, type 1, in pregnancy, unspecified trimester: Secondary | ICD-10-CM | POA: Insufficient documentation

## 2022-03-26 DIAGNOSIS — Z3A00 Weeks of gestation of pregnancy not specified: Secondary | ICD-10-CM | POA: Diagnosis not present

## 2022-03-26 DIAGNOSIS — O0990 Supervision of high risk pregnancy, unspecified, unspecified trimester: Secondary | ICD-10-CM | POA: Insufficient documentation

## 2022-03-26 DIAGNOSIS — Z713 Dietary counseling and surveillance: Secondary | ICD-10-CM | POA: Insufficient documentation

## 2022-03-26 DIAGNOSIS — O09529 Supervision of elderly multigravida, unspecified trimester: Secondary | ICD-10-CM | POA: Diagnosis not present

## 2022-03-26 DIAGNOSIS — O24012 Pre-existing diabetes mellitus, type 1, in pregnancy, second trimester: Secondary | ICD-10-CM

## 2022-03-26 NOTE — Progress Notes (Signed)
In-person interpreter Donnetta Hail ? ?Patient seen 03/26/22 for T1D in pregnancy self-management education ?EDD: 07/03/22 [redacted]w[redacted]d ? ?Start: 10:27     End  11:15 ? ?Wt Readings from Last 3 Encounters:  ?03/18/22 115 lb 6.4 oz (52.3 kg)  ?03/12/22 113 lb 14.4 oz (51.7 kg)  ?02/26/22 105 lb 3.2 oz (47.7 kg)  ? ?T1D medication management through [facility] Endocrinology - Dr. Lonzo Cloud.  ? ?Pt states she is taking her medication as prescribed:  ?Lantus: 25 units am/ 30 units p.m;  ?Novolog 15 units with meals + correction bolus if needed ? ?Pt reports she was not told to take insulin with snacks.  ? ?Pt AVS from endocrinology: ? ? ?Pt is using Dexcom appears some of her days appear well controlled but may be partially from her restricting her dietary intake. ? ?The main issue appears to be when she eats fruit for snacks without using insulin and only tries to correct a high sugar after the fact. She probably would not be so hungry at night if she would eat more during the day and for dinner. Patient does not like nuts or cheese, protein she eats includes meat, eggs, fish. Pt also drinks milk. ? ?Patient keep blood sugar log of FBS and 2 hr PPBG for her OB healthcare team. FBS numbers are misleading because she usually has snacks after midnight and does not bolus for them.  ? ?Patterns below from CGM report shows she does pretty well during the day. On patient's best day she states she ate very little and stopped eating when she could tell her blood sugar was about right even though she was hungry. ? ? ? ? May 3 and 4 below show when she ate snacks without bolus insulin. ? ?Plan: ?For care with Endocrinologist: Patient is to discuss potential bolus with snacks with endocrinologist ?For care with OB healthcare team: Blood glucose log sheet: Patient was asked to put a star next to the FBS numbers if she ate a snack during the night. ?

## 2022-03-27 ENCOUNTER — Encounter: Payer: Self-pay | Admitting: Cardiology

## 2022-03-27 ENCOUNTER — Ambulatory Visit (INDEPENDENT_AMBULATORY_CARE_PROVIDER_SITE_OTHER): Payer: Medicaid Other | Admitting: Cardiology

## 2022-03-27 VITALS — BP 126/90 | HR 74 | Ht 62.0 in | Wt 119.7 lb

## 2022-03-27 DIAGNOSIS — O24012 Pre-existing diabetes mellitus, type 1, in pregnancy, second trimester: Secondary | ICD-10-CM

## 2022-03-27 DIAGNOSIS — O10919 Unspecified pre-existing hypertension complicating pregnancy, unspecified trimester: Secondary | ICD-10-CM | POA: Diagnosis not present

## 2022-03-27 NOTE — Progress Notes (Signed)
?[facility] ? ?Follow Up Note ? ? ?Date:  03/27/2022  ? ?ID:  Kristin Clay, DOB Dec 11, 1985, MRN [id number] ? ?PCP:  Dorna Mai, MD ?  ?[facility] Providers ?Cardiologist:  Berniece Salines, DO  ?Electrophysiologist:  None      ? ? ?Referring MD: Dorna Mai, MD  ? ?Chief Complaint: " I am doing" ? ?History of Present Illness:   ? ?Kristin Clay is a 36 y.o. female [[name]] who returns for follow up of  ? ?  ?Medical history includes type 1 diabetes which was diagnosed at the age of 10 in her home country in [place] she has been on insulin since, hypertension was previously on losartan hydrochlorothiazide but has been transitioned to nifedipine since she became pregnant, history of severe preeclampsia was referred to the cardio obstetrics clinic for cardiovascular care during pregnancy and assessment of cardiovascular long-term risk. ? ?I saw the patient on January 02, 2022 at that time we talked about her cardiovascular risk her blood pressure was within target no changes were made.  I also started the patient on aspirin 81 mg daily.  She is here today for follow-up.  She is here with the interpreter as well as her daughter. ? ?She denies any chest pain or shortness of breath. ? ?Prior CV Studies Reviewed: ?The following studies were reviewed today: ? ? ?Past Medical History:  ?Diagnosis Date  ? Chronic hypertension   ? Complication of anesthesia   ? with hand surgery, local anes did not work, tried twice- had to put her to sleep  ? Diabetic retinopathy ([place])   ? DKA (diabetic ketoacidosis) ([place]) 10/2020  ? Epidural hematoma ([place]) 07/04/2021  ? Obstetric vaginal laceration with type 3c third degree perineal laceration 06/29/2021  ? Pyelonephritis 10/2020  ? Rh negative status during pregnancy 05/01/2021  ? s/p Rhogam 05/13/21  ? Type I diabetes mellitus ([place])   ? dx 21yr ago  ? UTI (urinary tract infection)   ? ? ?Past Surgical History:  ?Procedure Laterality Date  ? female  circumcision Bilateral   ? clitorectomy as well  ? FOOT SURGERY  2018  ? HAND SURGERY  2019  ?   ? ?OB History   ? ? Gravida  ?3  ? Para  ?1  ? Term  ?0  ? Preterm  ?1  ? AB  ?1  ? Living  ?1  ?  ? ? SAB  ?1  ? IAB  ?0  ? Ectopic  ?0  ? Multiple  ?0  ? Live Births  ?1  ?   ?  ?  ?    ? ? ?Current Medications: ?Current Meds  ?Medication Sig  ? Accu-Chek Softclix Lancets lancets Use as instructed; check blood glucose 4 times daily  ? acetaminophen (TYLENOL) 500 MG tablet Take 1,000 mg by mouth every 6 (six) hours as needed.  ? aspirin (ASPIRIN LOW DOSE) 81 MG chewable tablet CHEW 1 TABLET BY MOUTH DAILY.  ? Blood Glucose Monitoring Suppl (ACCU-CHEK GUIDE) w/Device KIT use as directed 4 (four) times daily.  ? Continuous Blood Gluc Transmit (DEXCOM G6 TRANSMITTER) MISC 1 Device by Does not apply route every 3 (three) months.  ? glucose blood (ACCU-CHEK GUIDE) test strip Use as instructed; check blood glucose 4 times daily  ? insulin aspart (NOVOLOG) 100 UNIT/ML injection Max daily 80 units  ? Insulin Disposable Pump (OMNIPOD 5 G6 INTRO, GEN 5,) KIT 1 Device by Does not apply route every 3 (  three) days.  ? Insulin Disposable Pump (OMNIPOD 5 G6 POD, GEN 5,) MISC 1 Device by Does not apply route every 3 (three) days.  ? insulin glargine (LANTUS) 100 UNIT/ML Solostar Pen Max daily 55 units  ? Insulin Pen Needle 32G X 4 MM MISC 1 Device by Does not apply route in the morning, at noon, in the evening, and at bedtime.  ? labetalol (NORMODYNE) 200 MG tablet Take 1 tablet (200 mg total) by mouth 2 (two) times daily.  ? metoCLOPramide (REGLAN) 10 MG tablet Take 1 tablet (10 mg total) by mouth 3 (three) times daily before meals.  ? NIFEdipine (PROCARDIA XL/NIFEDICAL-XL) 90 MG 24 hr tablet Take 1 tablet (90 mg total) by mouth daily.  ? ondansetron (ZOFRAN-ODT) 4 MG disintegrating tablet Take 1 tablet (4 mg total) by mouth every 6 (six) hours as needed for nausea.  ? pantoprazole (PROTONIX) 20 MG tablet Take 2 tablets (40 mg total)  by mouth daily.  ? Prenatal Vit-Fe Fumarate-FA (PRENATAL VITAMIN) 27-0.8 MG TABS Take 1 tablet by mouth daily.  ? prochlorperazine (COMPAZINE) 10 MG tablet Take 1 tablet (10 mg total) by mouth 2 (two) times daily as needed for nausea or vomiting.  ? promethazine (PHENERGAN) 25 MG suppository Place 1 suppository (25 mg total) rectally every 6 (six) hours as needed for nausea or vomiting.  ? scopolamine (TRANSDERM-SCOP) 1 MG/3DAYS Place 1 patch (1.5 mg total) onto the skin every 3 (three) days.  ?  ? ?Allergies:   Pork-derived products  ? ?Social History  ? ?Socioeconomic History  ? Marital status: Married  ?  Spouse name: Not on file  ? Number of children: Not on file  ? Years of education: Not on file  ? Highest education level: Not on file  ?Occupational History  ? Not on file  ?Tobacco Use  ? Smoking status: Never  ? Smokeless tobacco: Never  ?Vaping Use  ? Vaping Use: Never used  ?Substance and Sexual Activity  ? Alcohol use: Never  ? Drug use: Never  ? Sexual activity: Yes  ?  Birth control/protection: None  ?Other Topics Concern  ? Not on file  ?Social History Narrative  ? ** Merged History Encounter **  ?    ? ** Merged History Encounter **  ?    ? ?Social Determinants of Health  ? ?Financial Resource Strain: High Risk  ? Difficulty of Paying Living Expenses: Very hard  ?Food Insecurity: Food Insecurity Present  ? Worried About Charity fundraiser in the Last Year: Sometimes true  ? Ran Out of Food in the Last Year: Sometimes true  ?Transportation Needs: Unmet Transportation Needs  ? Lack of Transportation (Medical): Yes  ? Lack of Transportation (Non-Medical): Yes  ?Physical Activity: Not on file  ?Stress: Not on file  ?Social Connections: Not on file  ?  ? ? ?Family History  ?Problem Relation Age of Onset  ? Hyperlipidemia Mother   ? Hypertension Mother   ? Kidney disease Father   ? Alzheimer's disease Father   ?   ? ?ROS:   ?Please see the history of present illness.    ? ?All other systems reviewed and are  negative. ? ? ?Labs/EKG Reviewed:   ? ?EKG:   ?EKG is was ordered today.   ? ?Recent Labs: ?12/15/2021: TSH 0.770 ?02/26/2022: Hemoglobin 15.4; Platelets 271 ?03/01/2022: ALT 6 ?03/02/2022: Magnesium 1.8 ?03/05/2022: BUN 9; Creatinine, Ser 0.54; Potassium 4.1; Sodium 133  ? ?Recent Lipid Panel ?Lab Results  ?  Component Value Date/Time  ? CHOL 179 10/29/2019 02:43 AM  ? TRIG 77 10/29/2019 02:43 AM  ? HDL 35 (L) 10/29/2019 02:43 AM  ? CHOLHDL 5.1 10/29/2019 02:43 AM  ? LDLCALC 129 (H) 10/29/2019 02:43 AM  ? ? ?Physical Exam:   ? ?VS:  BP 126/90   Pulse 74   Ht _0  (1.575 m)   Wt 119 lb 11.2 oz (54.3 kg)   LMP 09/20/2021   SpO2 99%   Breastfeeding No   BMI 21.89 kg/m?    ? ?Wt Readings from Last 3 Encounters:  ?03/27/22 119 lb 11.2 oz (54.3 kg)  ?03/18/22 115 lb 6.4 oz (52.3 kg)  ?03/12/22 113 lb 14.4 oz (51.7 kg)  ?  ? ?GEN: Well nourished, well developed in no acute distress ?HEENT: Normal ?NECK: No JVD; No carotid bruits ?LYMPHATICS: No lymphadenopathy ?CARDIAC: RRR, no murmurs, rubs, gallops ?RESPIRATORY:  Clear to auscultation without rales, wheezing or rhonchi  ?ABDOMEN: Soft, non-tender, non-distended ?MUSCULOSKELETAL:  No edema; No deformity  ?SKIN: Warm and dry ?NEUROLOGIC:  Alert and oriented x 3 ?PSYCHIATRIC:  Normal affect  ? ? ?Risk Assessment/Risk Calculators:   ?  ?CARPREG II ?Risk Prediction Index Score:  1.  The patient's risk for a primary cardiac event is 5%. ?  ?   ?  ?  ? ? ?ASSESSMENT & PLAN:   ? ?Chronic hypertension in pregnancy ?Diabetes mellitus ? ? ?Her blood pressure is within normal limits no changes in her medication at this time. ?Her diabetes is being managed by the endocrinology team. ?Continue aspirin 81 mg for her preeclampsia prophylaxis. ? ?Patient Instructions  ?Medication Instructions:  ?Your physician recommends that you continue on your current medications as directed. Please refer to the Current Medication list given to you today.  ?*If you need a refill on your cardiac  medications before your next appointment, please call your pharmacy* ? ? ?Lab Work: ?None ?If you have labs (blood work) drawn today and your tests are completely normal, you will receive your results only by: ?M

## 2022-03-27 NOTE — Patient Instructions (Signed)
Medication Instructions:  ?Your physician recommends that you continue on your current medications as directed. Please refer to the Current Medication list given to you today.  ?*If you need a refill on your cardiac medications before your next appointment, please call your pharmacy* ? ? ?Lab Work: ?None ?If you have labs (blood work) drawn today and your tests are completely normal, you will receive your results only by: ?MyChart Message (if you have MyChart) OR ?A paper copy in the mail ?If you have any lab test that is abnormal or we need to change your treatment, we will call you to review the results. ? ? ?Testing/Procedures: ?None ? ? ?Follow-Up: ?At [facility], you and your health needs are our priority.  As part of our continuing mission to provide you with exceptional heart care, we have created designated Provider Care Teams.  These Care Teams include your primary Cardiologist (physician) and Advanced Practice Providers (APPs -  Physician Assistants and Nurse Practitioners) who all work together to provide you with the care you need, when you need it. ? ?We recommend signing up for the patient portal called "MyChart".  Sign up information is provided on this After Visit Summary.  MyChart is used to connect with patients for Virtual Visits (Telemedicine).  Patients are able to view lab/test results, encounter notes, upcoming appointments, etc.  Non-urgent messages can be sent to your provider as well.   ?To learn more about what you can do with MyChart, go to [email or web address].   ? ?Your next appointment:   ?4 week(s) ? ?The format for your next appointment:   ?In Person ? ?Provider:   ?Berniece Salines  ?[facility] ?[address], [place], [place] [address] ? ? ?Other Instructions ? ? ?Important Information About Sugar ? ? ? ? ?  ?

## 2022-03-31 ENCOUNTER — Ambulatory Visit: Payer: Medicaid Other | Attending: Obstetrics & Gynecology

## 2022-03-31 ENCOUNTER — Ambulatory Visit: Payer: Medicaid Other

## 2022-04-01 ENCOUNTER — Ambulatory Visit: Payer: Medicaid Other | Admitting: Internal Medicine

## 2022-04-01 NOTE — Progress Notes (Deleted)
Name: Kristin Clay  MRN/ DOB: [id number], 10-03-1986   Age/ Sex: 36 y.o., female    PCP: Dorna Mai, MD   Reason for Endocrinology Evaluation: Type 1 Diabetes Mellitus     Date of Initial Endocrinology Visit: 03/18/2022    PATIENT IDENTIFIER: Kristin Clay is a 35 y.o. female with a past medical history of T1 DM. The patient presented for initial endocrinology clinic visit on 03/18/2022 for consultative assistance with her diabetes management.    HPI: Kristin Clay was    Diagnosed with T1DM at age 50          Hemoglobin A1c has ranged from 8.4% in 2023, peaking at 13.0% in 2020. Patient has required hospitalization within the last 1 year from hyper or hypoglycemia: Yes 02/2022 with DKA  She is currently 24.5 weeks of gestation with a boy  She has 34 months old baby   Paternal aunts with T1 diabetes   Pt with tingling of hands and feet, she has partial ring finger amputations B/L   SUBJECTIVE:   During the last visit (03/18/2022): A1c   Today (04/01/22): Kristin Clay is here for a follow up on diabetes management. She checks  her blood sugars multiple  times daily, through CGM. Marland Kitchen The patient has *** had hypoglycemic episodes since the last clinic visit, which typically occur *** x / - most often occuring ***. The patient is *** symptomatic with these episodes, with symptoms of {symptoms; hypoglycemia:[id number]}.      HOME DIABETES REGIMEN: Lantus 25 units QAM and 30 units QPM  Novolog 15 units TID QAC Correction Factor: NOvolog (BG-130/20)     GLUCOSE LOG:   fasting 2 hrs  2 hrs after lunch 2 hrs after supper  03/15/2022 210 170 180 200  4/24 230 200 195 185  4/25 210 170 310 252  4/26 280        DIABETIC COMPLICATIONS: Microvascular complications:  Left eye retinopathy (S/P laser treatment )  Denies: CKD  Last eye exam: Completed 01/2021  Macrovascular complications:   Denies: CAD, PVD, CVA   PAST HISTORY: Past  Medical History:  Past Medical History:  Diagnosis Date   Chronic hypertension    Complication of anesthesia    with hand surgery, local anes did not work, tried twice- had to put her to sleep   Diabetic retinopathy ([place])    DKA (diabetic ketoacidosis) ([place]) 10/2020   Epidural hematoma ([place]) 07/04/2021   Obstetric vaginal laceration with type 3c third degree perineal laceration 06/29/2021   Pyelonephritis 10/2020   Rh negative status during pregnancy 05/01/2021   s/p Rhogam 05/13/21   Type I diabetes mellitus ([place])    dx 36yr ago   UTI (urinary tract infection)    Past Surgical History:  Past Surgical History:  Procedure Laterality Date   female circumcision Bilateral    clitorectomy as well   FOOT SURGERY  2018   HAND SURGERY  2019    Social History:  reports that she has never smoked. She has never used smokeless tobacco. She reports that she does not drink alcohol and does not use drugs. Family History:  Family History  Problem Relation Age of Onset   Hyperlipidemia Mother    Hypertension Mother    Kidney disease Father    Alzheimer's disease Father      HOME MEDICATIONS: Allergies as of 04/01/2022       Reactions   Pork-derived Products  Medication List        Accurate as of Apr 01, 2022  2:46 PM. If you have any questions, ask your nurse or doctor.          Accu-Chek Guide test strip Generic drug: glucose blood Use as instructed; check blood glucose 4 times daily   Accu-Chek Guide w/Device Kit use as directed 4 (four) times daily.   Accu-Chek Softclix Lancets lancets Use as instructed; check blood glucose 4 times daily   acetaminophen 500 MG tablet Commonly known as: TYLENOL Take 1,000 mg by mouth every 6 (six) hours as needed.   aspirin 81 MG chewable tablet Commonly known as: Aspirin Low Dose CHEW 1 TABLET BY MOUTH DAILY.   Dexcom G6 Transmitter Misc 1 Device by Does not apply route every 3 (three) months.   insulin aspart 100  UNIT/ML injection Commonly known as: novoLOG Max daily 80 units   insulin glargine 100 UNIT/ML Solostar Pen Commonly known as: LANTUS Max daily 55 units   Insulin Pen Needle 32G X 4 MM Misc 1 Device by Does not apply route in the morning, at noon, in the evening, and at bedtime.   labetalol 200 MG tablet Commonly known as: NORMODYNE Take 1 tablet (200 mg total) by mouth 2 (two) times daily.   metoCLOPramide 10 MG tablet Commonly known as: Reglan Take 1 tablet (10 mg total) by mouth 3 (three) times daily before meals.   NIFEdipine 90 MG 24 hr tablet Commonly known as: PROCARDIA XL/NIFEDICAL-XL Take 1 tablet (90 mg total) by mouth daily.   Omnipod 5 G6 Intro (Gen 5) Kit 1 Device by Does not apply route every 3 (three) days.   Omnipod 5 G6 Pod (Gen 5) Misc 1 Device by Does not apply route every 3 (three) days.   ondansetron 4 MG disintegrating tablet Commonly known as: ZOFRAN-ODT Take 1 tablet (4 mg total) by mouth every 6 (six) hours as needed for nausea.   pantoprazole 20 MG tablet Commonly known as: Protonix Take 2 tablets (40 mg total) by mouth daily.   potassium chloride SA 20 MEQ tablet Commonly known as: KLOR-CON M Take 2 tablets (40 mEq total) by mouth daily for 3 days.   Prenatal Vitamin 27-0.8 MG Tabs Take 1 tablet by mouth daily.   prochlorperazine 10 MG tablet Commonly known as: COMPAZINE Take 1 tablet (10 mg total) by mouth 2 (two) times daily as needed for nausea or vomiting.   promethazine 25 MG suppository Commonly known as: PHENERGAN Place 1 suppository (25 mg total) rectally every 6 (six) hours as needed for nausea or vomiting.   Transderm-Scop 1 MG/3DAYS Generic drug: scopolamine Place 1 patch (1.5 mg total) onto the skin every 3 (three) days.         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  Neuro: MS is good with appropriate affect, pt is alert and Ox3    DM  foot exam: 03/18/2022  The skin of the feet is intact without sores or ulcerations. The pedal pulses are 2+ on right and 2+ on left. The sensation is decreased to a screening 5.07, 10 gram monofilament bilaterally   DATA REVIEWED:  Lab Results  Component Value Date   HGBA1C 8.4 (H) 02/26/2022   HGBA1C 10.4 (H) 12/15/2021   HGBA1C 10.4 (A) 12/09/2021   Lab Results  Component Value Date   LDLCALC 129 (H) 10/29/2019   CREATININE 0.54 03/05/2022   No results found for: [facility]  Lab Results  Component Value Date   CHOL 179 10/29/2019   HDL 35 (L) 10/29/2019   LDLCALC 129 (H) 10/29/2019   TRIG 77 10/29/2019   CHOLHDL 5.1 10/29/2019        ASSESSMENT / PLAN / RECOMMENDATIONS:   1) Type 1 Diabetes Mellitus, Poorly controlled, Withneuropathic, and retinopathic and neuropathic complications - Most recent A1c of  8.4%. Goal A1c < 6.5 %.    Plan: GENERAL: I have discussed with the patient the pathophysiology of diabetes. We went over the natural progression of the disease. We stressed the importance of lifestyle changes . I explained the complications associated with diabetes including retinopathy, nephropathy, neuropathy as well as increased risk of cardiovascular disease. We went over the benefit seen with glycemic control.  I explained to the patient that diabetic patients are at higher than normal risk for amputations.  We discussed the importance of optimizing glucose control for  fetal health and to reduce risk of congenital abnormalities She just started using the Dexcom and is happy with it She is interested in insulin pump technology, a prescription for OmniPod has been sent to her pharmacy and a referral has been placed for training In the meantime we will adjust her insulin as below She will also be provided with a correction scale  MEDICATIONS: Increase Lantus to 25 units in the morning and 30 units in the evening Increase NovoLog 15 units 3 times daily before every meal Correction factor: NovoLog (BG -130/30)  EDUCATION / INSTRUCTIONS: BG monitoring instructions: Patient is instructed to check her blood sugars 4 times a day, fasting and 2 hours post meals. Call [facility] if: BG persistently < 60  I reviewed the Rule of 15 for the treatment of hypoglycemia in detail with the patient. Literature supplied.   2) Diabetic complications:  Eye: Does  have known diabetic retinopathy.  Neuro/ Feet: Does  have known diabetic peripheral neuropathy. Renal: Patient does not have known baseline CKD. She is not on an ACEI/ARB at present.   Follow-up in 2 weeks     Signed electronically by: Mack Guise, MD  [facility]  [address] [place], [place] [address] Phone: [phone] FAX: [phone]   CC: Dorna Mai, [address] [place] [address] Phone: [phone]  Fax: [phone]    Return to Endocrinology clinic as below: Future Appointments  Date Time Provider [place]  04/01/2022  3:00 PM Jalan Bodi, Melanie Crazier, MD [facility] None  04/06/2022  9:30 AM Bernarda Caffey, MD [facility] None  04/13/2022  8:15 AM Griffin Basil, MD [facility]  04/13/2022  8:50 AM [facility]  04/24/2022  1:40 PM Berniece Salines, DO [facility] None  05/04/2022  9:15 AM Aletha Halim, MD [facility]  05/18/2022  9:15 AM Aletha Halim, MD [facility]  [facility]

## 2022-04-03 NOTE — Progress Notes (Signed)
?[facility] Note ? ?04/06/2022 ? ?  ? ?CHIEF COMPLAINT ?Patient presents for Retina Follow Up ? ? ?HISTORY OF PRESENT ILLNESS: ?Kristin Clay is a 36 y.o. female who presents to the clinic today for:  ? ?HPI   ? ? Retina Follow Up   ?Patient presents with  Diabetic Retinopathy.  In both eyes.  This started 4 weeks ago.  I, the attending physician,  performed the HPI with the patient and updated documentation appropriately. ? ?  ?  ? ? Comments   ?Patient here for 4 weeks retina follow up for PDR OU. Patient states vision going good. No eye pain. ? ?  ?  ?Last edited by Bernarda Caffey, MD on 04/06/2022 12:08 PM.  ?  ? ?Pt states her vision is "good", she states she has not been in the hospital recently, her BP and blood sugar are under control and her pregnancy is going well, she had no problems with the laser at last visit, she used the drops as directed ? ?Referring physician: ?Gevena Cotton MD ?[place], [place] [address] ? ?HISTORICAL INFORMATION:  ? ?Selected notes from the [facility] ?Referred by Dr. Frederico Hamman for diabetic retinal evaluation ?LEE:  ?Ocular Hx- ?PMH-pregnancy, DM ?  ? ?CURRENT MEDICATIONS: ?No current outpatient medications on file. (Ophthalmic Drugs)  ? ?No current facility-administered medications for this visit. (Ophthalmic Drugs)  ? ?Current Outpatient Medications (Other)  ?Medication Sig  ? Accu-Chek Softclix Lancets lancets Use as instructed; check blood glucose 4 times daily  ? acetaminophen (TYLENOL) 500 MG tablet Take 1,000 mg by mouth every 6 (six) hours as needed.  ? aspirin (ASPIRIN LOW DOSE) 81 MG chewable tablet CHEW 1 TABLET BY MOUTH DAILY.  ? Blood Glucose Monitoring Suppl (ACCU-CHEK GUIDE) w/Device KIT use as directed 4 (four) times daily.  ? Continuous Blood Gluc Transmit (DEXCOM G6 TRANSMITTER) MISC 1 Device by Does not apply route every 3 (three) months.  ? glucose blood (ACCU-CHEK GUIDE) test strip Use  as instructed; check blood glucose 4 times daily  ? insulin aspart (NOVOLOG) 100 UNIT/ML injection Max daily 80 units  ? Insulin Disposable Pump (OMNIPOD 5 G6 INTRO, GEN 5,) KIT 1 Device by Does not apply route every 3 (three) days.  ? Insulin Disposable Pump (OMNIPOD 5 G6 POD, GEN 5,) MISC 1 Device by Does not apply route every 3 (three) days.  ? insulin glargine (LANTUS) 100 UNIT/ML Solostar Pen Max daily 55 units  ? Insulin Pen Needle 32G X 4 MM MISC 1 Device by Does not apply route in the morning, at noon, in the evening, and at bedtime.  ? labetalol (NORMODYNE) 200 MG tablet Take 1 tablet (200 mg total) by mouth 2 (two) times daily.  ? metoCLOPramide (REGLAN) 10 MG tablet Take 1 tablet (10 mg total) by mouth 3 (three) times daily before meals.  ? NIFEdipine (PROCARDIA XL/NIFEDICAL-XL) 90 MG 24 hr tablet Take 1 tablet (90 mg total) by mouth daily.  ? ondansetron (ZOFRAN-ODT) 4 MG disintegrating tablet Take 1 tablet (4 mg total) by mouth every 6 (six) hours as needed for nausea.  ? pantoprazole (PROTONIX) 20 MG tablet Take 2 tablets (40 mg total) by mouth daily.  ? Prenatal Vit-Fe Fumarate-FA (PRENATAL VITAMIN) 27-0.8 MG TABS Take 1 tablet by mouth daily.  ? prochlorperazine (COMPAZINE) 10 MG tablet Take 1 tablet (10 mg total) by mouth 2 (two) times daily as needed for nausea or  vomiting.  ? scopolamine (TRANSDERM-SCOP) 1 MG/3DAYS Place 1 patch (1.5 mg total) onto the skin every 3 (three) days.  ? potassium chloride SA (KLOR-CON M) 20 MEQ tablet Take 2 tablets (40 mEq total) by mouth daily for 3 days.  ? promethazine (PHENERGAN) 25 MG suppository Place 1 suppository (25 mg total) rectally every 6 (six) hours as needed for nausea or vomiting. (Patient not taking: Reported on 04/06/2022)  ? ?No current facility-administered medications for this visit. (Other)  ? ?REVIEW OF SYSTEMS: ?ROS   ?Positive for: Endocrine, Eyes ?Negative for: Constitutional, Gastrointestinal, Neurological, Skin, Genitourinary,  Musculoskeletal, HENT, Cardiovascular, Respiratory, Psychiatric, Allergic/Imm, Heme/Lymph ?Last edited by Theodore Demark, COA on 04/06/2022  9:56 AM.  ?  ? ? ?ALLERGIES ?Allergies  ?Allergen Reactions  ? Pork-Derived Products   ? ?PAST MEDICAL HISTORY ?Past Medical History:  ?Diagnosis Date  ? Chronic hypertension   ? Complication of anesthesia   ? with hand surgery, local anes did not work, tried twice- had to put her to sleep  ? Diabetic retinopathy ([place])   ? DKA (diabetic ketoacidosis) ([place]) 10/2020  ? Epidural hematoma ([place]) 07/04/2021  ? Obstetric vaginal laceration with type 3c third degree perineal laceration 06/29/2021  ? Pyelonephritis 10/2020  ? Rh negative status during pregnancy 05/01/2021  ? s/p Rhogam 05/13/21  ? Type I diabetes mellitus ([place])   ? dx 48yr ago  ? UTI (urinary tract infection)   ? ?Past Surgical History:  ?Procedure Laterality Date  ? female circumcision Bilateral   ? clitorectomy as well  ? FOOT SURGERY  2018  ? HAND SURGERY  2019  ? ?FAMILY HISTORY ?Family History  ?Problem Relation Age of Onset  ? Hyperlipidemia Mother   ? Hypertension Mother   ? Kidney disease Father   ? Alzheimer's disease Father   ? ?SOCIAL HISTORY ?Social History  ? ?Tobacco Use  ? Smoking status: Never  ? Smokeless tobacco: Never  ?Vaping Use  ? Vaping Use: Never used  ?Substance Use Topics  ? Alcohol use: Never  ? Drug use: Never  ?  ? ?  ?OPHTHALMIC EXAM: ? ?Base Eye Exam   ? ? Visual Acuity (Snellen - Linear)   ? ?   Right Left  ? Dist [place] 20/30 20/25 +1  ? Dist ph [place] NI NI  ? ?  ?  ? ? Tonometry (Tonopen, 9:54 AM)   ? ?   Right Left  ? Pressure 14 13  ? ?  ?  ? ? Pupils   ? ?   Dark Light Shape React APD  ? Right 3 2 Round Brisk None  ? Left 3 2 Round Brisk None  ? ?  ?  ? ? Visual Fields   ? ?   Left Right  ? Restrictions Partial outer inferior temporal deficiency Partial outer superior temporal, superior nasal deficiencies  ? ?  ?  ? ? Extraocular Movement   ? ?   Right Left  ?  Full, Ortho Full, Ortho   ? ?  ?  ? ? Neuro/Psych   ? ? Oriented x3: Yes  ? Mood/Affect: Normal  ? ?  ?  ? ? Dilation   ? ? Both eyes: 1.0% Mydriacyl, 2.5% Phenylephrine @ 9:54 AM  ? ?  ?  ? ?  ? ?Slit Lamp and Fundus Exam   ? ? Slit Lamp Exam   ? ?   Right Left  ? Lids/Lashes normal normal  ? Conjunctiva/Sclera mild melanosis meld  milanosis  ? Cornea Clear Clear  ? Anterior Chamber deep and quiet deep and quiet  ? Iris round and dilated, no NVI round and dilated, no NVI  ? Lens Clear Clear  ? Anterior Vitreous syneresis syneresis  ? ?  ?  ? ? Fundus Exam   ? ?   Right Left  ? Disc pink and sharp; compact; mild hyperemia; +fibrosis w/ early, fine NVD; +PPP pink and sharp; compact; +fibrosis w/ early fine NVD - regressing  ? C/D Ratio 0.2 0.2  ? Macula blunted foveal reflex, scattered MA/DBH, exudates, +CWS, fine NVE inferior macula - persistent good foveal reflex, ERM with stria, scattered MA/DBH, exudates  ? Vessels attenuated, Tortuous, +NVE greatest inferior arcades attenuated, copper wiring, early fine NVE -- scattered  ? Periphery attached, 360 MA/DBH, good 360 PRP laser changes attached, 360 MA/DBH, good 360 PRP  ? ?  ?  ? ?  ? ? ?IMAGING AND PROCEDURES  ?Imaging and Procedures for 04/06/2022 ? ?OCT, Retina - OU - Both Eyes   ? ?   ?Right Eye ?Quality was good. Central Foveal Thickness: 283. Progression has worsened. Findings include intraretinal hyper-reflective material, vitreomacular adhesion , abnormal foveal contour, preretinal fibrosis, intraretinal fluid, no SRF (Mild interval increase in IRF greatest IT mac and fovea, +PRF overlying disc).  ? ?Left Eye ?Quality was good. Central Foveal Thickness: 297. Progression has worsened. Findings include no IRF, no SRF, preretinal fibrosis, macular pucker, vitreous traction, epiretinal membrane, abnormal foveal contour (Mild interval increase in IRF temporal macula, pre retinal fibrosis overlying disc extending to macula w/ mild traction).  ? ?Notes ?*Images captured and stored on  drive ? ?Diagnosis / Impression:  ?PDR w/ DME OU ?OD: Mild interval increase in IRF greatest IT mac and fovea, +PRF overlying disc ?OS: Mild interval increase in IRF temporal macula, pre retinal fibrosis overlying disc exten

## 2022-04-06 ENCOUNTER — Encounter (INDEPENDENT_AMBULATORY_CARE_PROVIDER_SITE_OTHER): Payer: Self-pay | Admitting: Ophthalmology

## 2022-04-06 ENCOUNTER — Ambulatory Visit (INDEPENDENT_AMBULATORY_CARE_PROVIDER_SITE_OTHER): Payer: Medicaid Other | Admitting: Ophthalmology

## 2022-04-06 DIAGNOSIS — H35373 Puckering of macula, bilateral: Secondary | ICD-10-CM | POA: Diagnosis not present

## 2022-04-06 DIAGNOSIS — E113513 Type 2 diabetes mellitus with proliferative diabetic retinopathy with macular edema, bilateral: Secondary | ICD-10-CM

## 2022-04-06 DIAGNOSIS — O0992 Supervision of high risk pregnancy, unspecified, second trimester: Secondary | ICD-10-CM

## 2022-04-06 DIAGNOSIS — E103513 Type 1 diabetes mellitus with proliferative diabetic retinopathy with macular edema, bilateral: Secondary | ICD-10-CM | POA: Diagnosis not present

## 2022-04-06 DIAGNOSIS — I10 Essential (primary) hypertension: Secondary | ICD-10-CM | POA: Diagnosis not present

## 2022-04-06 DIAGNOSIS — H35033 Hypertensive retinopathy, bilateral: Secondary | ICD-10-CM | POA: Diagnosis not present

## 2022-04-13 ENCOUNTER — Other Ambulatory Visit: Payer: Self-pay

## 2022-04-13 ENCOUNTER — Ambulatory Visit (INDEPENDENT_AMBULATORY_CARE_PROVIDER_SITE_OTHER): Payer: Medicaid Other | Admitting: Obstetrics and Gynecology

## 2022-04-13 ENCOUNTER — Inpatient Hospital Stay (HOSPITAL_COMMUNITY)
Admission: AD | Admit: 2022-04-13 | Discharge: 2022-04-13 | Disposition: A | Discharge status: Home / Self Care | Payer: Medicaid Other | Attending: Family Medicine | Admitting: Family Medicine

## 2022-04-13 ENCOUNTER — Other Ambulatory Visit: Payer: Medicaid Other

## 2022-04-13 ENCOUNTER — Encounter (HOSPITAL_COMMUNITY): Payer: Self-pay | Admitting: Obstetrics and Gynecology

## 2022-04-13 VITALS — BP 146/92 | HR 87 | Wt 123.1 lb

## 2022-04-13 DIAGNOSIS — E1065 Type 1 diabetes mellitus with hyperglycemia: Secondary | ICD-10-CM | POA: Insufficient documentation

## 2022-04-13 DIAGNOSIS — Z3A28 28 weeks gestation of pregnancy: Secondary | ICD-10-CM

## 2022-04-13 DIAGNOSIS — Z7982 Long term (current) use of aspirin: Secondary | ICD-10-CM | POA: Diagnosis not present

## 2022-04-13 DIAGNOSIS — O24013 Pre-existing diabetes mellitus, type 1, in pregnancy, third trimester: Secondary | ICD-10-CM | POA: Diagnosis not present

## 2022-04-13 DIAGNOSIS — O0990 Supervision of high risk pregnancy, unspecified, unspecified trimester: Secondary | ICD-10-CM

## 2022-04-13 DIAGNOSIS — O09513 Supervision of elderly primigravida, third trimester: Secondary | ICD-10-CM | POA: Diagnosis not present

## 2022-04-13 DIAGNOSIS — Z7189 Other specified counseling: Secondary | ICD-10-CM

## 2022-04-13 DIAGNOSIS — Z79899 Other long term (current) drug therapy: Secondary | ICD-10-CM | POA: Insufficient documentation

## 2022-04-13 DIAGNOSIS — O26899 Other specified pregnancy related conditions, unspecified trimester: Secondary | ICD-10-CM

## 2022-04-13 DIAGNOSIS — O26893 Other specified pregnancy related conditions, third trimester: Secondary | ICD-10-CM | POA: Diagnosis not present

## 2022-04-13 DIAGNOSIS — O10913 Unspecified pre-existing hypertension complicating pregnancy, third trimester: Secondary | ICD-10-CM | POA: Diagnosis present

## 2022-04-13 DIAGNOSIS — O163 Unspecified maternal hypertension, third trimester: Secondary | ICD-10-CM

## 2022-04-13 DIAGNOSIS — Z789 Other specified health status: Secondary | ICD-10-CM

## 2022-04-13 DIAGNOSIS — Z6791 Unspecified blood type, Rh negative: Secondary | ICD-10-CM

## 2022-04-13 DIAGNOSIS — E10319 Type 1 diabetes mellitus with unspecified diabetic retinopathy without macular edema: Secondary | ICD-10-CM | POA: Diagnosis not present

## 2022-04-13 DIAGNOSIS — O10919 Unspecified pre-existing hypertension complicating pregnancy, unspecified trimester: Secondary | ICD-10-CM

## 2022-04-13 DIAGNOSIS — Z23 Encounter for immunization: Secondary | ICD-10-CM | POA: Diagnosis not present

## 2022-04-13 DIAGNOSIS — D563 Thalassemia minor: Secondary | ICD-10-CM

## 2022-04-13 DIAGNOSIS — Z794 Long term (current) use of insulin: Secondary | ICD-10-CM | POA: Diagnosis not present

## 2022-04-13 DIAGNOSIS — N90810 Female genital mutilation status, unspecified: Secondary | ICD-10-CM

## 2022-04-13 DIAGNOSIS — O09899 Supervision of other high risk pregnancies, unspecified trimester: Secondary | ICD-10-CM

## 2022-04-13 DIAGNOSIS — O09299 Supervision of pregnancy with other poor reproductive or obstetric history, unspecified trimester: Secondary | ICD-10-CM

## 2022-04-13 LAB — PROTEIN / CREATININE RATIO, URINE
Creatinine, Urine: 33.91 mg/dL
Protein Creatinine Ratio: 0.91 mg/mg{Cre} — ABNORMAL HIGH (ref 0.00–0.15)
Total Protein, Urine: 31 mg/dL

## 2022-04-13 LAB — URINALYSIS, ROUTINE W REFLEX MICROSCOPIC
Bacteria, UA: NONE SEEN
Bilirubin Urine: NEGATIVE
Glucose, UA: >=500 mg/dL — AB
Ketones, ur: NEGATIVE mg/dL
Nitrite: NEGATIVE
Protein, ur: 30 mg/dL — AB
Specific Gravity, Urine: 1.013 (ref 1.005–1.030)
pH: 5 (ref 5.0–8.0)

## 2022-04-13 LAB — COMPREHENSIVE METABOLIC PANEL
ALT: 10 U/L (ref 0–44)
AST: 13 U/L — ABNORMAL LOW (ref 15–41)
Albumin: 2.7 g/dL — ABNORMAL LOW (ref 3.5–5.0)
Alkaline Phosphatase: 63 U/L (ref 38–126)
Anion gap: 7 (ref 5–15)
BUN: 6 mg/dL (ref 6–20)
CO2: 17 mmol/L — ABNORMAL LOW (ref 22–32)
Calcium: 8.2 mg/dL — ABNORMAL LOW (ref 8.9–10.3)
Chloride: 108 mmol/L (ref 98–111)
Creatinine, Ser: 0.44 mg/dL (ref 0.44–1.00)
GFR, Estimated: >60 mL/min (ref >60)
Glucose, Bld: 255 mg/dL — ABNORMAL HIGH (ref 70–99)
Potassium: 3.7 mmol/L (ref 3.5–5.1)
Sodium: 132 mmol/L — ABNORMAL LOW (ref 135–145)
Total Bilirubin: 0.3 mg/dL (ref 0.3–1.2)
Total Protein: 6.5 g/dL (ref 6.5–8.1)

## 2022-04-13 LAB — CBC
HCT: 39.1 % (ref 36.0–46.0)
Hemoglobin: 12.7 g/dL (ref 12.0–15.0)
MCH: 28.3 pg (ref 26.0–34.0)
MCHC: 32.5 g/dL (ref 30.0–36.0)
MCV: 87.3 fL (ref 80.0–100.0)
Platelets: 208 10*3/uL (ref 150–400)
RBC: 4.48 MIL/uL (ref 3.87–5.11)
RDW: 14.2 % (ref 11.5–15.5)
WBC: 4.7 10*3/uL (ref 4.0–10.5)
nRBC: 0 % (ref 0.0–0.2)

## 2022-04-13 LAB — GLUCOSE, CAPILLARY
Glucose-Capillary: 272 mg/dL — ABNORMAL HIGH (ref 70–99)
Glucose-Capillary: 191 mg/dL — ABNORMAL HIGH (ref 70–99)

## 2022-04-13 MED ORDER — LABETALOL HCL 300 MG PO TABS: 300.0000 mg | ORAL_TABLET | Freq: Two times a day (BID) | ORAL | 3 refills | Status: DC

## 2022-04-13 MED ORDER — LACTATED RINGERS IV BOLUS
1000.0000 mL | Freq: Once | INTRAVENOUS | Status: DC
Start: 2022-04-13 — End: 2022-04-13

## 2022-04-13 MED ORDER — RHO D IMMUNE GLOBULIN 1500 UNIT/2ML IJ SOSY
300.0000 ug | PREFILLED_SYRINGE | Freq: Once | INTRAMUSCULAR | Status: AC
  Administered 2022-04-13: 300 ug via INTRAMUSCULAR

## 2022-04-13 MED ORDER — METOCLOPRAMIDE HCL 5 MG/ML IJ SOLN
10.0000 mg | Freq: Once | INTRAMUSCULAR | Status: AC
  Administered 2022-04-13: 10 mg via INTRAVENOUS
  Filled 2022-04-13: qty 2

## 2022-04-13 MED ORDER — LACTATED RINGERS IV BOLUS
1000.0000 mL | Freq: Once | INTRAVENOUS | Status: AC
  Administered 2022-04-13: 1000 mL via INTRAVENOUS

## 2022-04-13 MED ORDER — DIPHENHYDRAMINE HCL 50 MG/ML IJ SOLN
25.0000 mg | Freq: Once | INTRAMUSCULAR | Status: AC
  Administered 2022-04-13: 25 mg via INTRAVENOUS
  Filled 2022-04-13: qty 1

## 2022-04-13 NOTE — MAU Provider Note (Signed)
History     CSN: [id number]  Arrival date and time: 04/13/22 0944   Event Date/Time   First Provider Initiated Contact with Patient 04/13/22 1034      Chief Complaint  Patient presents with   Hypertension   HPI This is a 36 year old G3 P0-1-1-1 at 28 weeks and 3 days with a pregnancy complicated by type 1 diabetes with several episodes of bleeding during this pregnancy, diabetic retinopathy, Rh-.  Patient was seen in the office earlier today with elevated blood pressures and headache has been going on for the last 2 days.  Headache is not improved.  Planing of vision changes: Blurred vision and cloudy vision.  Additionally, patient's blood sugars have been in the 1 20-1 40 range fasting with some postprandials and low.  OB History     Gravida  3   Para  1   Term  0   Preterm  1   AB  1   Living  1      SAB  1   IAB  0   Ectopic  0   Multiple  0   Live Births  1           Past Medical History:  Diagnosis Date   Chronic hypertension    Complication of anesthesia    with hand surgery, local anes did not work, tried twice- had to put her to sleep   Diabetic retinopathy ([place])    DKA (diabetic ketoacidosis) ([place]) 10/2020   Epidural hematoma ([place]) 07/04/2021   Obstetric vaginal laceration with type 3c third degree perineal laceration 06/29/2021   Pyelonephritis 10/2020   Rh negative status during pregnancy 05/01/2021   s/p Rhogam 05/13/21   Type I diabetes mellitus ([place])    dx 36yr ago   UTI (urinary tract infection)     Past Surgical History:  Procedure Laterality Date   female circumcision Bilateral    clitorectomy as well   FOOT SURGERY  2018   HAND SURGERY  2019    Family History  Problem Relation Age of Onset   Hyperlipidemia Mother    Hypertension Mother    Kidney disease Father    Alzheimer's disease Father     Social History   Tobacco Use   Smoking status: Never   Smokeless tobacco: Never  Vaping Use   Vaping Use: Never used   Substance Use Topics   Alcohol use: Never   Drug use: Never    Allergies:  Allergies  Allergen Reactions   Pork-Derived Products     Medications Prior to Admission  Medication Sig Dispense Refill Last Dose   acetaminophen (TYLENOL) 500 MG tablet Take 1,000 mg by mouth every 6 (six) hours as needed.   04/13/2022 at 0730   aspirin (ASPIRIN LOW DOSE) 81 MG chewable tablet CHEW 1 TABLET BY MOUTH DAILY. 90 tablet 1 04/13/2022   insulin aspart (NOVOLOG) 100 UNIT/ML injection Max daily 80 units 30 mL 11 04/13/2022   insulin glargine (LANTUS) 100 UNIT/ML Solostar Pen Max daily 55 units 15 mL 6 04/13/2022   labetalol (NORMODYNE) 200 MG tablet Take 1 tablet (200 mg total) by mouth 2 (two) times daily. 60 tablet 2 04/13/2022   metoCLOPramide (REGLAN) 10 MG tablet Take 1 tablet (10 mg total) by mouth 3 (three) times daily before meals. 90 tablet 3 04/12/2022   NIFEdipine (PROCARDIA XL/NIFEDICAL-XL) 90 MG 24 hr tablet Take 1 tablet (90 mg total) by mouth daily. 30 tablet 2 04/12/2022   ondansetron (ZOFRAN-ODT)  4 MG disintegrating tablet Take 1 tablet (4 mg total) by mouth every 6 (six) hours as needed for nausea. 30 tablet 2 04/13/2022   pantoprazole (PROTONIX) 20 MG tablet Take 2 tablets (40 mg total) by mouth daily. 30 tablet 5 Past Month   Prenatal Vit-Fe Fumarate-FA (PRENATAL VITAMIN) 27-0.8 MG TABS Take 1 tablet by mouth daily. 90 tablet 2 04/12/2022   prochlorperazine (COMPAZINE) 10 MG tablet Take 1 tablet (10 mg total) by mouth 2 (two) times daily as needed for nausea or vomiting. 30 tablet 2 04/13/2022   Accu-Chek Softclix Lancets lancets Use as instructed; check blood glucose 4 times daily 200 each 10    Blood Glucose Monitoring Suppl (ACCU-CHEK GUIDE) w/Device KIT use as directed 4 (four) times daily. 1 kit 0    Continuous Blood Gluc Transmit (DEXCOM G6 TRANSMITTER) MISC 1 Device by Does not apply route every 3 (three) months. 3 each 1    glucose blood (ACCU-CHEK GUIDE) test strip Use as instructed;  check blood glucose 4 times daily 200 each 10    Insulin Disposable Pump (OMNIPOD 5 G6 INTRO, GEN 5,) KIT 1 Device by Does not apply route every 3 (three) days. (Patient not taking: Reported on 04/13/2022) 1 kit 0    Insulin Disposable Pump (OMNIPOD 5 G6 POD, GEN 5,) MISC 1 Device by Does not apply route every 3 (three) days. (Patient not taking: Reported on 04/13/2022) 6 each 3    Insulin Pen Needle 32G X 4 MM MISC 1 Device by Does not apply route in the morning, at noon, in the evening, and at bedtime. 200 each 11    potassium chloride SA (KLOR-CON M) 20 MEQ tablet Take 2 tablets (40 mEq total) by mouth daily for 3 days. (Patient not taking: Reported on 04/13/2022) 6 tablet 0    promethazine (PHENERGAN) 25 MG suppository Place 1 suppository (25 mg total) rectally every 6 (six) hours as needed for nausea or vomiting. (Patient not taking: Reported on 04/06/2022) 12 each 0    scopolamine (TRANSDERM-SCOP) 1 MG/3DAYS Place 1 patch (1.5 mg total) onto the skin every 3 (three) days. (Patient not taking: Reported on 04/13/2022) 10 patch 12     Review of Systems Physical Exam   Blood pressure (!) 150/90, pulse 97, temperature (!) 97.5 F (36.4 C), temperature source Oral, resp. rate 18, height '5\' 2"'  (1.575 m), weight 55.8 kg, last menstrual period 09/20/2021, SpO2 100 %, not currently breastfeeding.  Physical Exam Vitals reviewed.  Constitutional:      Appearance: Normal appearance.  Cardiovascular:     Rate and Rhythm: Normal rate and regular rhythm.     Pulses: Normal pulses.  Pulmonary:     Effort: Pulmonary effort is normal.     Breath sounds: Normal breath sounds.  Abdominal:     General: Abdomen is flat.     Palpations: Abdomen is soft.  Skin:    General: Skin is warm and dry.     Capillary Refill: Capillary refill takes less than 2 seconds.  Neurological:     General: No focal deficit present.     Mental Status: She is alert and oriented to person, place, and time.  Psychiatric:         Mood and Affect: Mood normal.        Behavior: Behavior normal.        Thought Content: Thought content normal.        Judgment: Judgment normal.   Results for orders placed or performed  during the hospital encounter of 04/13/22 (from the past 24 hour(s))  Glucose, capillary     Status: Abnormal   Collection Time: 04/13/22 10:09 AM  Result Value Ref Range   Glucose-Capillary 272 (H) 70 - 99 mg/dL  Comprehensive metabolic panel     Status: Abnormal   Collection Time: 04/13/22 10:20 AM  Result Value Ref Range   Sodium 132 (L) 135 - 145 mmol/L   Potassium 3.7 3.5 - 5.1 mmol/L   Chloride 108 98 - 111 mmol/L   CO2 17 (L) 22 - 32 mmol/L   Glucose, Bld 255 (H) 70 - 99 mg/dL   BUN 6 6 - 20 mg/dL   Creatinine, Ser 0.44 0.44 - 1.00 mg/dL   Calcium 8.2 (L) 8.9 - 10.3 mg/dL   Total Protein 6.5 6.5 - 8.1 g/dL   Albumin 2.7 (L) 3.5 - 5.0 g/dL   AST 13 (L) 15 - 41 U/L   ALT 10 0 - 44 U/L   Alkaline Phosphatase 63 38 - 126 U/L   Total Bilirubin 0.3 0.3 - 1.2 mg/dL   GFR, Estimated >60 >60 mL/min   Anion gap 7 5 - 15  CBC     Status: None   Collection Time: 04/13/22 10:20 AM  Result Value Ref Range   WBC 4.7 4.0 - 10.5 K/uL   RBC 4.48 3.87 - 5.11 MIL/uL   Hemoglobin 12.7 12.0 - 15.0 g/dL   HCT 39.1 36.0 - 46.0 %   MCV 87.3 80.0 - 100.0 fL   MCH 28.3 26.0 - 34.0 pg   MCHC 32.5 30.0 - 36.0 g/dL   RDW 14.2 11.5 - 15.5 %   Platelets 208 150 - 400 K/uL   nRBC 0.0 0.0 - 0.2 %  Protein / creatinine ratio, urine     Status: Abnormal   Collection Time: 04/13/22 11:50 AM  Result Value Ref Range   Creatinine, Urine 33.91 mg/dL   Total Protein, Urine 31 mg/dL   Protein Creatinine Ratio 0.91 (H) 0.00 - 0.15 mg/mg[Cre]  Urinalysis, Routine w reflex microscopic     Status: Abnormal   Collection Time: 04/13/22 11:50 AM  Result Value Ref Range   Color, Urine YELLOW YELLOW   APPearance CLEAR CLEAR   Specific Gravity, Urine 1.013 1.005 - 1.030   pH 5.0 5.0 - 8.0   Glucose, UA >=500 (A) NEGATIVE  mg/dL   Hgb urine dipstick SMALL (A) NEGATIVE   Bilirubin Urine NEGATIVE NEGATIVE   Ketones, ur NEGATIVE NEGATIVE mg/dL   Protein, ur 30 (A) NEGATIVE mg/dL   Nitrite NEGATIVE NEGATIVE   Leukocytes,Ua SMALL (A) NEGATIVE   RBC / HPF 0-5 0 - 5 RBC/hpf   WBC, UA 0-5 0 - 5 WBC/hpf   Bacteria, UA NONE SEEN NONE SEEN   Squamous Epithelial / LPF 0-5 0 - 5  Glucose, capillary     Status: Abnormal   Collection Time: 04/13/22  1:12 PM  Result Value Ref Range   Glucose-Capillary 191 (H) 70 - 99 mg/dL   No results found.   MAU Course  Procedures NST:  Baseline: 140  Variability: moder Accelerations: 10x10  Decelerations: none Contractions: none    MDM CBG here in 272.  We will start IV fluids and check CBC, CMP. As patient has headache with elevated blood pressures, will also check urine protein creatinine ratio.  We will give patient Benadryl and Reglan to see if this will resolve her headache.  1245 - headache improved.   Assessment and  Plan   1. [redacted] weeks gestation of pregnancy   2. Uncontrolled type 1 diabetes mellitus with hyperglycemia ([place])   3. Hypertension affecting pregnancy in third trimester    Patient's blood pressures have varied while being here.  Many within normal range, will with several others being elevated.  Patient currently on labetalol 200 mg twice a day.  Will increase to 300 mg twice a day.  Patient is not in DKA.  Patient to go home and take insulin.  We are currently working with diabetes educator to secure Dexcom CGM.   Truett Mainland 04/13/2022, 12:56 PM

## 2022-04-13 NOTE — MAU Note (Signed)
.  Kristin Clay is a 36 y.o. at [redacted]w[redacted]d here in MAU reporting: had HBP at [facility] office and was sent in for workup. Denies VB or LOF. Reports good FM. Reports HA (8/10; took Tylenol 1000 mg around 0730, did not help), describes blurry vision and having a curtain in front of her eyes (started three days ago), denies RUQ pain, reports swelling in her legs. RN notes that left ankle is swollen on assessment.   Patient reported CBG 135 before breakfast this morning.  Onset of complaint: today Pain score: 8/10 Vitals:   04/13/22 1006  BP: 129/89  Pulse: (!) 105  Resp: 18  Temp: (!) 97.5 F (36.4 C)  SpO2: 100%     FHT:140 Lab orders placed from triage:  UA

## 2022-04-13 NOTE — Progress Notes (Addendum)
PRENATAL VISIT NOTE  Subjective:  Kristin Clay is a 36 y.o. [phone] at [redacted]w[redacted]d being seen today for ongoing prenatal care.  She is currently monitored for the following issues for this high-risk pregnancy and has Female circumcision; Language barrier; Poor compliance; Hypertension; Supervision of high risk pregnancy, antepartum; Rh negative, antepartum; Type 1 diabetes mellitus affecting pregnancy in third trimester, antepartum; Chronic hypertension affecting pregnancy; Short interval between pregnancies affecting pregnancy, antepartum; History of severe preeclampsia, prior pregnancy, currently pregnant; Previous preterm delivery at 33w6 due to severe preeclampsia, antepartum; History of forceps delivery in prior pregnancy, currently pregnant; Proteinuria affecting pregnancy; Atypical chest pain; Blurry vision; DM type 1 causing eye disease (HCC); Shortness of breath; Alpha thalassemia silent carrier; DKA, type 1 (HCC); Hypokalemia; Red Chart Rounds; Hypomagnesemia; 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  Patient doing well with no acute concerns today. She reports headache.  Contractions: Not present. Vag. Bleeding: None.  Movement: Present. Denies leaking of fluid.   The following portions of the patient's history were reviewed and updated as appropriate: allergies, current medications, past family history, past medical history, past social history, past surgical history and problem list. Problem list updated.  Objective:   Vitals:   04/13/22 0833 04/13/22 0844  BP: (!) 152/98 (!) 146/92  Pulse: 87 87  Weight: 123 lb 1.6 oz (55.8 kg)     Fetal Status: Fetal Heart Rate (bpm): 140 Fundal Height: 28 cm Movement: Present     General:  Alert, oriented and cooperative. Patient is in no acute distress.  Skin: Skin  is warm and dry. No rash noted.   Cardiovascular: Normal heart rate noted  Respiratory: Normal respiratory effort, no problems with respiration noted  Abdomen: Soft, gravid, appropriate for gestational age.  Pain/Pressure: Absent     Pelvic: Cervical exam deferred        Extremities: Normal range of motion.  Edema: None  Mental Status:  Normal mood and affect. Normal behavior. Normal judgment and thought content.   Assessment and Plan:  Pregnancy: [name] at [redacted]w[redacted]d  1. [redacted] weeks gestation of pregnancy   2. Chronic hypertension affecting pregnancy BP elevated today; however with history of early severe preeclampsia, will send to MAU for further evaluation.  PT may need BP medication modification - [place] [facility] OB FOLLOW UP; Future  3. Type 1 diabetes mellitus affecting pregnancy in third trimester, antepartum FBS: 135, 200 yesterday PPBS: no clear answer  Lantus: 35 units in AM              25 units in PM  Novolog:   15 units each meal, also has sliding scale  Unsure if pt has good control of blood sugars.  Fasting BS suggests not.  Her dexcom is apparently broken or unusable.  Will look into finding a replacement.  Pt may be getting an omnipod for continuous therapy - Ambulatory referral to Nephrology - [place] [facility] FOLLOW UP; Future  4. Female circumcision Will need to be addressed at time of delivery  5. Language barrier Live interpreter present  6. Supervision of high risk pregnancy, antepartum Continue routine care - Tdap vaccine greater than or equal to 7yo IM - [place] [facility] OB FOLLOW UP; Future  7. Rh negative, antepartum Rhogam given - rho (d) immune globulin (RHIG/RHOPHYLAC) injection 300 mcg  8. Short interval between pregnancies affecting  pregnancy, antepartum   9. History of severe preeclampsia, prior pregnancy, currently pregnant Pt to L and D for further evaluation of BP and labs - [place] [facility] OB FOLLOW UP; Future  10. Alpha thalassemia silent carrier   11. Red Chart  Rounds Reviewed chart,  unsure if fetal echo has been done, pt says she went, but I cannot find the results.  Nursing asked to call and see if they can get results.   Referral placed for nephrology Endocrinology visit reviewed  - [place] [facility] OB FOLLOW UP; Future  Preterm labor symptoms and general obstetric precautions including but not limited to vaginal bleeding, contractions, leaking of fluid and fetal movement were reviewed in detail with the patient.  Please refer to After Visit Summary for other counseling recommendations.   Return in about 2 weeks (around 04/27/2022) for [facility], in person.   Mariel Aloe, MD Faculty Attending [facility]

## 2022-04-13 NOTE — Progress Notes (Signed)
Patient ultrasound scheduled for 04/14/22 at 3:30 PM. Patient notified.

## 2022-04-14 ENCOUNTER — Telehealth: Payer: Self-pay | Admitting: Registered"

## 2022-04-14 ENCOUNTER — Ambulatory Visit (HOSPITAL_BASED_OUTPATIENT_CLINIC_OR_DEPARTMENT_OTHER): Payer: Medicaid Other

## 2022-04-14 ENCOUNTER — Ambulatory Visit: Payer: Medicaid Other | Admitting: *Deleted

## 2022-04-14 VITALS — BP 126/90 | HR 87

## 2022-04-14 DIAGNOSIS — O0990 Supervision of high risk pregnancy, unspecified, unspecified trimester: Secondary | ICD-10-CM

## 2022-04-14 DIAGNOSIS — O09299 Supervision of pregnancy with other poor reproductive or obstetric history, unspecified trimester: Secondary | ICD-10-CM | POA: Diagnosis not present

## 2022-04-14 DIAGNOSIS — O10919 Unspecified pre-existing hypertension complicating pregnancy, unspecified trimester: Secondary | ICD-10-CM

## 2022-04-14 DIAGNOSIS — Z7189 Other specified counseling: Secondary | ICD-10-CM | POA: Diagnosis not present

## 2022-04-14 DIAGNOSIS — O26899 Other specified pregnancy related conditions, unspecified trimester: Secondary | ICD-10-CM

## 2022-04-14 DIAGNOSIS — O24013 Pre-existing diabetes mellitus, type 1, in pregnancy, third trimester: Secondary | ICD-10-CM

## 2022-04-14 DIAGNOSIS — O1213 Gestational proteinuria, third trimester: Secondary | ICD-10-CM

## 2022-04-14 LAB — CBC
Hematocrit: 35.6 % (ref 34.0–46.6)
Hemoglobin: 11.9 g/dL (ref 11.1–15.9)
MCH: 29.1 pg (ref 26.6–33.0)
MCHC: 33.4 g/dL (ref 31.5–35.7)
MCV: 87 fL (ref 79–97)
Platelets: 199 10*3/uL (ref 150–450)
RBC: 4.09 x10E6/uL (ref 3.77–5.28)
RDW: 13.5 % (ref 11.7–15.4)
WBC: 3.5 10*3/uL (ref 3.4–10.8)

## 2022-04-14 LAB — RPR: RPR Ser Ql: NONREACTIVE

## 2022-04-14 LAB — ANTIBODY SCREEN: Antibody Screen: NEGATIVE

## 2022-04-14 LAB — HIV ANTIBODY (ROUTINE TESTING W REFLEX): HIV Screen 4th Generation wRfx: NONREACTIVE

## 2022-04-14 IMAGING — US US MFM OB FOLLOW-UP
1 series · 13 of 28 positions shown · non-contrast
Comparison: none

[Series 1: us mfm ob follow-up · 69 acquisitions, 13 frames shown]
[im 3/69]
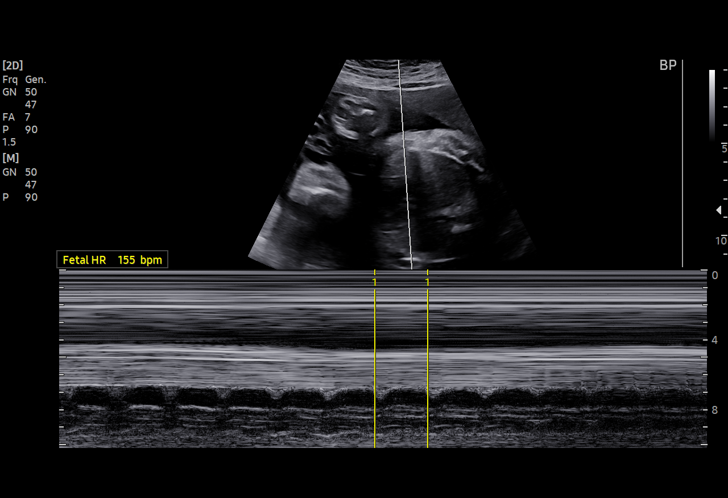
[im 8/69]
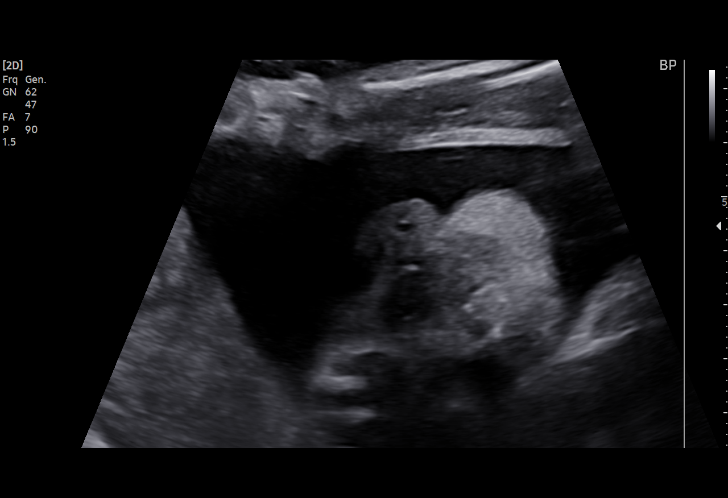
[im 13/69]
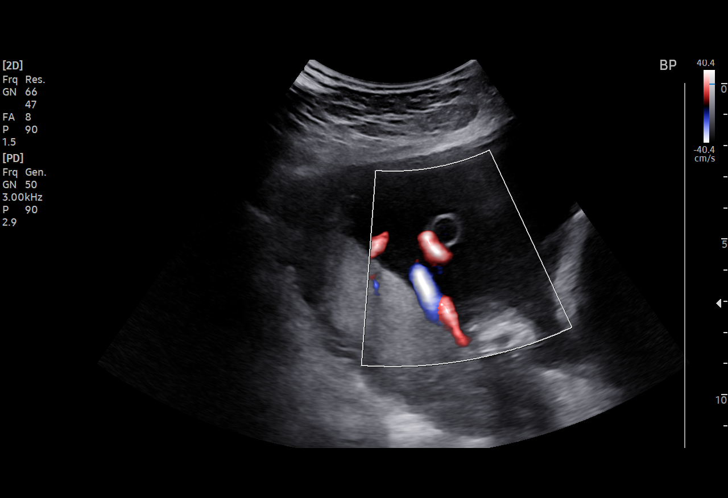
[im 18/69]
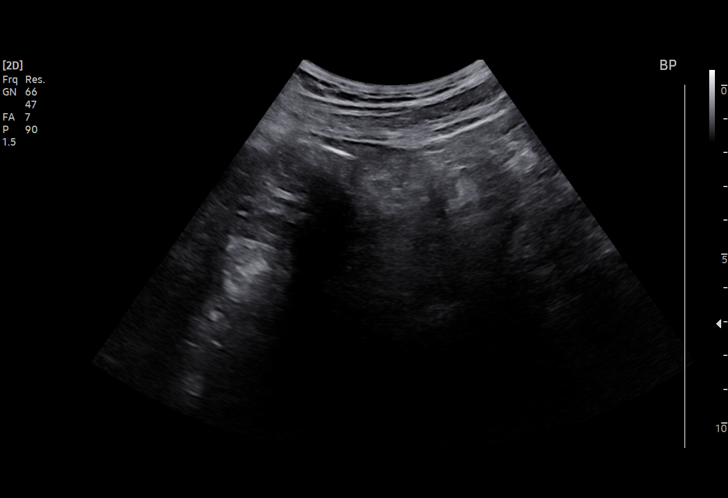
[im 23/69]
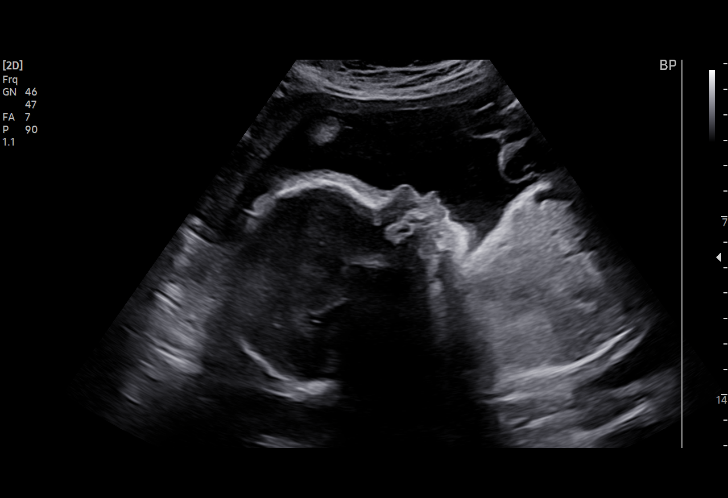
[im 28/69]
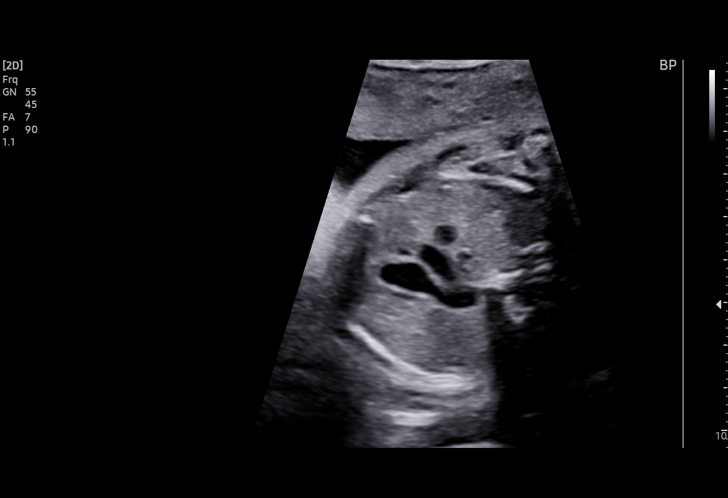
[im 36/69]
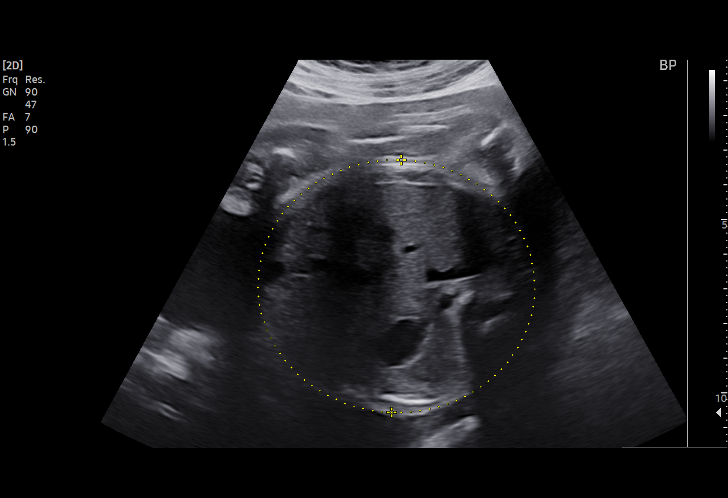
[im 41/69]
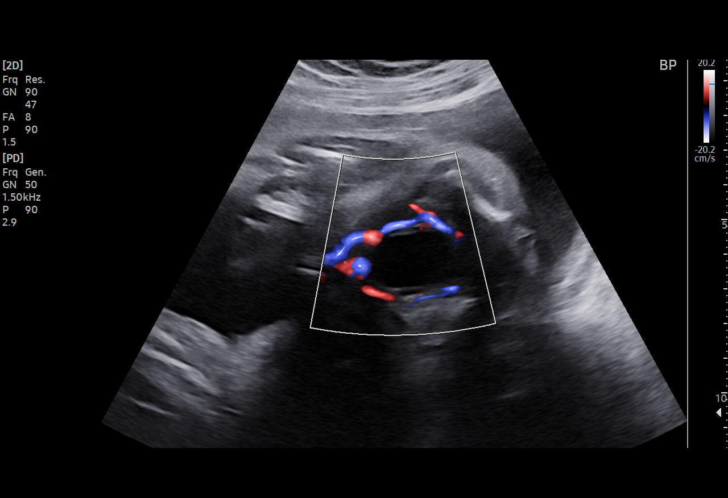
[im 46/69]
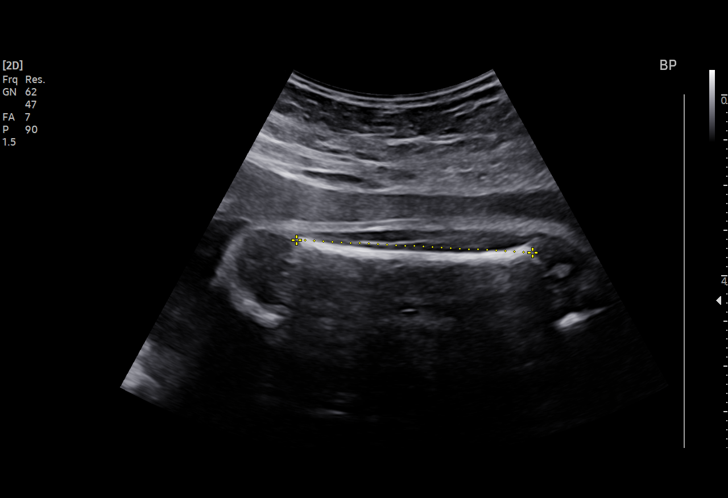
[im 51/69]
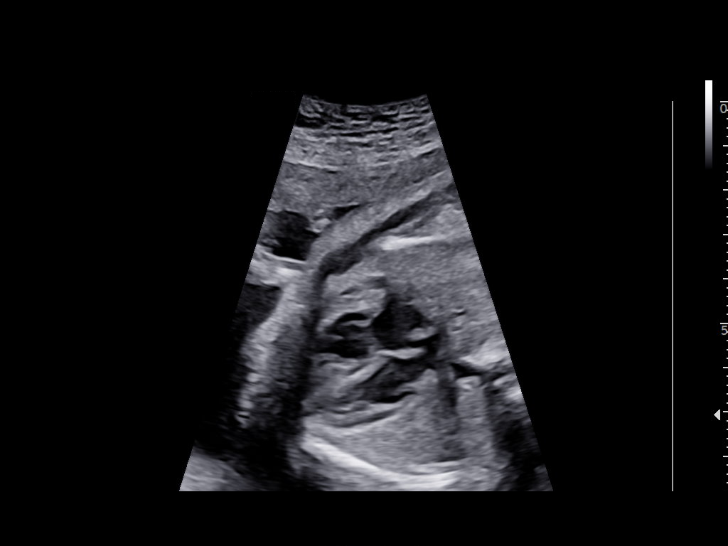
[im 56/69]
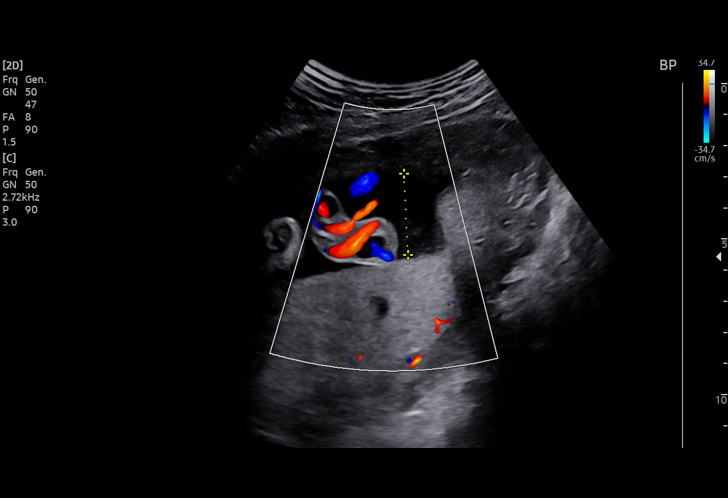
[im 61/69]
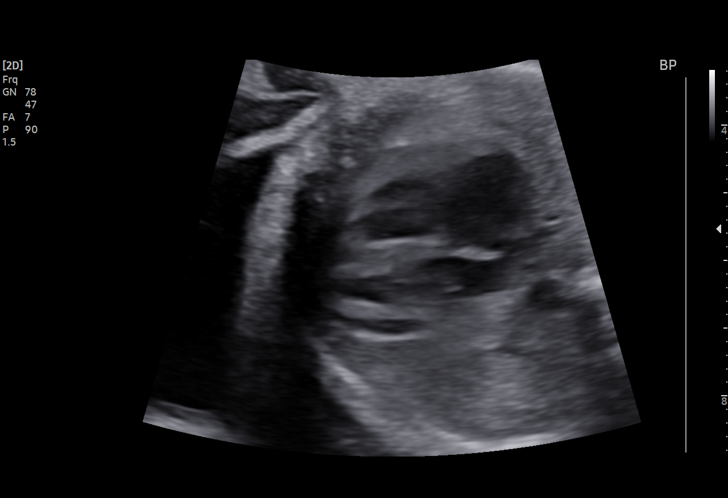
[im 66/69]
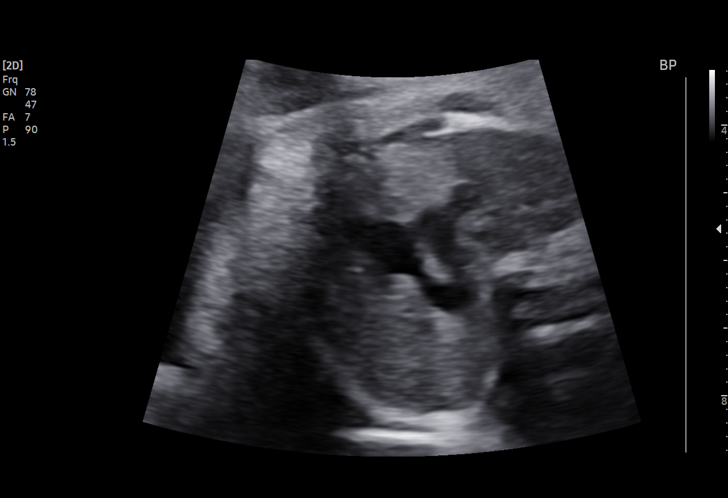

[13 of 28 positions shown; findings below may reference images not displayed]

[facility]

                   Care

Indications

 Advanced maternal age multigravida 35+,        [XU]
 third trimester
 Pre-existing diabetes, type 1, in pregnancy,   [XU]
 third trimester
 Hypertension - Chronic/Pre-existing            [XU]
 (Labetalol, Procardia)
 Poor obstetric history: Previous               [XU]
 preeclampsia / eclampsia/gestational HTN
 Medical complication of pregnancy (Female      [XU]
 circumcision)
 28 weeks gestation of pregnancy
Fetal Evaluation

 Num Of Fetuses:         1
 Fetal Heart Rate(bpm):  155
 Cardiac Activity:       Observed
 Presentation:           Breech
 Placenta:               Posterior
 P. Cord Insertion:      Visualized, central

 Amniotic Fluid
 AFI FV:      Within normal limits

 AFI Sum(cm)     %Tile       Largest Pocket(cm)
 12.99           37

 RUQ(cm)       RLQ(cm)       LUQ(cm)        LLQ(cm)

Biometry

 BPD:      69.1  mm     G. Age:  27w 5d         16  %    CI:        72.13   %    70 - 86
                                                         FL/HC:      20.2   %    19.6 -
 HC:      258.9  mm     G. Age:  28w 1d         10  %    HC/AC:      1.07        0.99 -
 AC:      241.1  mm     G. Age:  28w 3d         37  %    FL/BPD:     75.5   %    71 - 87
 FL:       52.2  mm     G. Age:  27w 6d         16  %    FL/AC:      21.7   %    20 - 24
 HUM:        47  mm     G. Age:  27w 5d         26  %

 LV:        5.2  mm

 Est. FW:    [XU]  gm    2 lb 10 oz      22  %
OB History

 Gravidity:    3
 Living:       1
Gestational Age

 LMP:           29w 3d        Date:  [DATE]                 EDD:   [DATE]
 U/S Today:     28w 0d                                        EDD:   [DATE]
 Best:          28w 4d     Det. By:  U/S  ([DATE])          EDD:   [DATE]
Anatomy

 Cranium:               Appears normal         Aortic Arch:            Previously seen
 Cavum:                 Appears normal         Ductal Arch:            Previously seen
 Ventricles:            Appears normal         Diaphragm:              Appears normal
 Choroid Plexus:        Previously seen        Stomach:                Appears normal, left
                                                                       sided
 Cerebellum:            Previously seen        Abdomen:                Appears normal
 Posterior Fossa:       Previously seen        Abdominal Wall:         Previously seen
 Nuchal Fold:           Not applicable (>20    Cord Vessels:           Appears normal (3
                        wks GA)                                        vessel cord)
 Face:                  Appears normal         Kidneys:                Appear normal
                        (orbits and profile)
 Lips:                  Appears normal         Bladder:                Appears normal
 Thoracic:              Appears normal         Spine:                  Limited views
                                                                       previously seen
 Heart:                 Previously seen        Upper Extremities:      Previously seen
 RVOT:                  Appears normal         Lower Extremities:      Previously seen
 LVOT:                  Appears normal

 Other:  3VV and 3VTV visualized. VC, 3vv, heels/feet and open hands/5th
         digits, nasal bone, lenses, maxilla, mandible and falx prev visualized.
         Male gender previously seen.
Cervix Uterus Adnexa

 Cervix
 Not visualized (advanced GA >[XU])

 Uterus
 Normal shape and size.

 Right Ovary
 Not visualized.
 Left Ovary
 Not visualized.
Comments

 This patient was seen for a follow up growth scan due to
 chronic hypertension treated with Procardia and labetalol and
 uncontrolled pregestational diabetes.  The patient was
 hospitalized last month due to diabetic ketoacidosis.  She
 reports feeling fetal movements throughout the day.
 She was informed that the fetal growth and amniotic fluid
 level appears appropriate for her gestational age.
 Due to pregestational diabetes, she was given a referral for a
 fetal echocardiogram with [facility].
 We will start weekly fetal testing at 32 weeks.
 A BPP was scheduled in 3 weeks.

## 2022-04-14 NOTE — Progress Notes (Signed)
Pt c/o blurred vision X 5 days and a headache 4 days ago; She reports taking tylenol which did not help.

## 2022-04-14 NOTE — Telephone Encounter (Signed)
Placed call using [employer], Elenora Fender [phone]  RD tried to reach patient at the request of Dr. Donavan Foil. In her appointment yesterday Pt said Dexcom not working.  Left msg that I can see Pt today at 1:15 and she needs to bring all her supplies

## 2022-04-15 ENCOUNTER — Telehealth: Payer: Self-pay | Admitting: *Deleted

## 2022-04-18 ENCOUNTER — Inpatient Hospital Stay (HOSPITAL_COMMUNITY)
Admission: AD | Admit: 2022-04-18 | Discharge: 2022-04-23 | DRG: 831 | Disposition: A | Discharge status: Home / Self Care | Payer: Medicaid Other | Attending: Family Medicine | Admitting: Family Medicine

## 2022-04-18 ENCOUNTER — Encounter (HOSPITAL_COMMUNITY): Payer: Self-pay | Admitting: Family Medicine

## 2022-04-18 DIAGNOSIS — R112 Nausea with vomiting, unspecified: Secondary | ICD-10-CM

## 2022-04-18 DIAGNOSIS — Z794 Long term (current) use of insulin: Secondary | ICD-10-CM | POA: Diagnosis not present

## 2022-04-18 DIAGNOSIS — Z3A29 29 weeks gestation of pregnancy: Secondary | ICD-10-CM | POA: Diagnosis not present

## 2022-04-18 DIAGNOSIS — O10919 Unspecified pre-existing hypertension complicating pregnancy, unspecified trimester: Secondary | ICD-10-CM | POA: Diagnosis not present

## 2022-04-18 DIAGNOSIS — O212 Late vomiting of pregnancy: Secondary | ICD-10-CM | POA: Diagnosis not present

## 2022-04-18 DIAGNOSIS — O24012 Pre-existing diabetes mellitus, type 1, in pregnancy, second trimester: Secondary | ICD-10-CM

## 2022-04-18 DIAGNOSIS — O10013 Pre-existing essential hypertension complicating pregnancy, third trimester: Secondary | ICD-10-CM | POA: Diagnosis present

## 2022-04-18 DIAGNOSIS — E1010 Type 1 diabetes mellitus with ketoacidosis without coma: Secondary | ICD-10-CM | POA: Diagnosis not present

## 2022-04-18 DIAGNOSIS — O24013 Pre-existing diabetes mellitus, type 1, in pregnancy, third trimester: Principal | ICD-10-CM

## 2022-04-18 DIAGNOSIS — O219 Vomiting of pregnancy, unspecified: Secondary | ICD-10-CM | POA: Diagnosis not present

## 2022-04-18 LAB — COMPREHENSIVE METABOLIC PANEL
ALT: 9 U/L (ref 0–44)
AST: 12 U/L — ABNORMAL LOW (ref 15–41)
Albumin: 3.1 g/dL — ABNORMAL LOW (ref 3.5–5.0)
Alkaline Phosphatase: 85 U/L (ref 38–126)
Anion gap: 16 — ABNORMAL HIGH (ref 5–15)
BUN: 9 mg/dL (ref 6–20)
CO2: 17 mmol/L — ABNORMAL LOW (ref 22–32)
Calcium: 9 mg/dL (ref 8.9–10.3)
Chloride: 105 mmol/L (ref 98–111)
Creatinine, Ser: 0.63 mg/dL (ref 0.44–1.00)
GFR, Estimated: >60 mL/min (ref >60)
Glucose, Bld: 226 mg/dL — ABNORMAL HIGH (ref 70–99)
Potassium: 3.2 mmol/L — ABNORMAL LOW (ref 3.5–5.1)
Sodium: 138 mmol/L (ref 135–145)
Total Bilirubin: 0.9 mg/dL (ref 0.3–1.2)
Total Protein: 7.9 g/dL (ref 6.5–8.1)

## 2022-04-18 LAB — URINALYSIS, ROUTINE W REFLEX MICROSCOPIC
Bilirubin Urine: NEGATIVE
Glucose, UA: >=500 mg/dL — AB
Ketones, ur: 80 mg/dL — AB
Nitrite: NEGATIVE
Protein, ur: >=300 mg/dL — AB
Specific Gravity, Urine: 1.025 (ref 1.005–1.030)
pH: 5 (ref 5.0–8.0)

## 2022-04-18 LAB — PROTEIN / CREATININE RATIO, URINE
Creatinine, Urine: 106.62 mg/dL
Total Protein, Urine: 790 mg/dL

## 2022-04-18 LAB — BASIC METABOLIC PANEL
Anion gap: 14 (ref 5–15)
BUN: 8 mg/dL (ref 6–20)
CO2: 17 mmol/L — ABNORMAL LOW (ref 22–32)
Calcium: 8.5 mg/dL — ABNORMAL LOW (ref 8.9–10.3)
Chloride: 108 mmol/L (ref 98–111)
Creatinine, Ser: 0.7 mg/dL (ref 0.44–1.00)
GFR, Estimated: >60 mL/min (ref >60)
Glucose, Bld: 231 mg/dL — ABNORMAL HIGH (ref 70–99)
Potassium: 3.4 mmol/L — ABNORMAL LOW (ref 3.5–5.1)
Sodium: 139 mmol/L (ref 135–145)

## 2022-04-18 LAB — CBC
HCT: 49.2 % — ABNORMAL HIGH (ref 36.0–46.0)
Hemoglobin: 16.6 g/dL — ABNORMAL HIGH (ref 12.0–15.0)
MCH: 29.2 pg (ref 26.0–34.0)
MCHC: 33.7 g/dL (ref 30.0–36.0)
MCV: 86.5 fL (ref 80.0–100.0)
Platelets: 281 10*3/uL (ref 150–400)
RBC: 5.69 MIL/uL — ABNORMAL HIGH (ref 3.87–5.11)
RDW: 14.4 % (ref 11.5–15.5)
WBC: 8 10*3/uL (ref 4.0–10.5)
nRBC: 0 % (ref 0.0–0.2)

## 2022-04-18 LAB — GLUCOSE, CAPILLARY
Glucose-Capillary: 190 mg/dL — ABNORMAL HIGH (ref 70–99)
Glucose-Capillary: 178 mg/dL — ABNORMAL HIGH (ref 70–99)
Glucose-Capillary: 174 mg/dL — ABNORMAL HIGH (ref 70–99)

## 2022-04-18 LAB — BETA-HYDROXYBUTYRIC ACID: Beta-Hydroxybutyric Acid: 4.16 mmol/L — ABNORMAL HIGH (ref 0.05–0.27)

## 2022-04-18 MED ORDER — DEXTROSE 50 % IV SOLN: 0.0000 mL | INTRAVENOUS | Status: DC | PRN

## 2022-04-18 MED ORDER — METOCLOPRAMIDE HCL 5 MG/ML IJ SOLN
5.0000 mg | Freq: Four times a day (QID) | INTRAMUSCULAR | Status: DC | PRN
  Administered 2022-04-18 – 2022-04-19 (×3): 5 mg via INTRAVENOUS
  Filled 2022-04-18 (×3): qty 2

## 2022-04-18 MED ORDER — FAMOTIDINE IN NACL 20-0.9 MG/50ML-% IV SOLN
20.0000 mg | Freq: Once | INTRAVENOUS | Status: AC
  Administered 2022-04-18: 20 mg via INTRAVENOUS
  Filled 2022-04-18: qty 50

## 2022-04-18 MED ORDER — SODIUM CHLORIDE 0.9% FLUSH: 3.0000 mL | INTRAVENOUS | Status: DC | PRN

## 2022-04-18 MED ORDER — LACTATED RINGERS IV SOLN: INTRAVENOUS | Status: DC

## 2022-04-18 MED ORDER — INSULIN REGULAR(HUMAN) IN NACL 100-0.9 UT/100ML-% IV SOLN
INTRAVENOUS | Status: DC
  Administered 2022-04-18: 3.2 [IU]/h via INTRAVENOUS
  Administered 2022-04-18: 3.6 [IU]/h via INTRAVENOUS
  Administered 2022-04-20: 2.4 [IU]/h via INTRAVENOUS
  Filled 2022-04-18 (×2): qty 100

## 2022-04-18 MED ORDER — DEXTROSE IN LACTATED RINGERS 5 % IV SOLN: INTRAVENOUS | Status: DC

## 2022-04-18 MED ORDER — DOCUSATE SODIUM 100 MG PO CAPS
100.0000 mg | ORAL_CAPSULE | Freq: Every day | ORAL | Status: DC
  Administered 2022-04-19 – 2022-04-23 (×4): 100 mg via ORAL
  Filled 2022-04-18 (×4): qty 1

## 2022-04-18 MED ORDER — PRENATAL MULTIVITAMIN CH
1.0000 | ORAL_TABLET | Freq: Every day | ORAL | Status: DC
  Administered 2022-04-19 – 2022-04-23 (×4): 1 via ORAL
  Filled 2022-04-18 (×5): qty 1

## 2022-04-18 MED ORDER — ONDANSETRON HCL 4 MG/2ML IJ SOLN
4.0000 mg | Freq: Four times a day (QID) | INTRAMUSCULAR | Status: DC | PRN
  Administered 2022-04-19 – 2022-04-20 (×4): 4 mg via INTRAVENOUS
  Filled 2022-04-18 (×4): qty 2

## 2022-04-18 MED ORDER — LABETALOL HCL 5 MG/ML IV SOLN: 80.0000 mg | INTRAVENOUS | Status: DC | PRN

## 2022-04-18 MED ORDER — ZOLPIDEM TARTRATE 5 MG PO TABS: 5.0000 mg | ORAL_TABLET | Freq: Every evening | ORAL | Status: DC | PRN

## 2022-04-18 MED ORDER — SODIUM CHLORIDE 0.9 % IV SOLN
8.0000 mg | Freq: Once | INTRAVENOUS | Status: AC
  Administered 2022-04-18: 8 mg via INTRAVENOUS
  Filled 2022-04-18: qty 4

## 2022-04-18 MED ORDER — SODIUM CHLORIDE 0.9% FLUSH
3.0000 mL | Freq: Two times a day (BID) | INTRAVENOUS | Status: DC
  Administered 2022-04-20: 3 mL via INTRAVENOUS

## 2022-04-18 MED ORDER — LABETALOL HCL 5 MG/ML IV SOLN
20.0000 mg | INTRAVENOUS | Status: DC | PRN
Start: 2022-04-18 — End: 2022-04-19
  Administered 2022-04-18: 20 mg via INTRAVENOUS
  Filled 2022-04-18: qty 4

## 2022-04-18 MED ORDER — POTASSIUM CHLORIDE 10 MEQ/100ML IV SOLN: 10.0000 meq | INTRAVENOUS | Status: DC

## 2022-04-18 MED ORDER — HYDRALAZINE HCL 20 MG/ML IJ SOLN: 10.0000 mg | INTRAMUSCULAR | Status: DC | PRN

## 2022-04-18 MED ORDER — LABETALOL HCL 200 MG PO TABS
300.0000 mg | ORAL_TABLET | Freq: Two times a day (BID) | ORAL | Status: DC
Start: 2022-04-18 — End: 2022-04-20
  Administered 2022-04-19 (×3): 300 mg via ORAL
  Filled 2022-04-18 (×3): qty 1

## 2022-04-18 MED ORDER — METOCLOPRAMIDE HCL 10 MG PO TABS
10.0000 mg | ORAL_TABLET | Freq: Three times a day (TID) | ORAL | Status: DC
  Administered 2022-04-19 (×3): 10 mg via ORAL
  Filled 2022-04-18 (×4): qty 1

## 2022-04-18 MED ORDER — ACETAMINOPHEN 325 MG PO TABS: 650.0000 mg | ORAL_TABLET | ORAL | Status: DC | PRN

## 2022-04-18 MED ORDER — HYDROMORPHONE HCL 1 MG/ML IJ SOLN
1.0000 mg | INTRAMUSCULAR | Status: DC | PRN
  Administered 2022-04-18: 1 mg via INTRAVENOUS
  Filled 2022-04-18: qty 1

## 2022-04-18 MED ORDER — LACTATED RINGERS IV BOLUS
1000.0000 mL | Freq: Once | INTRAVENOUS | Status: AC
  Administered 2022-04-18: 1000 mL via INTRAVENOUS

## 2022-04-18 MED ORDER — ASPIRIN 81 MG PO CHEW
81.0000 mg | CHEWABLE_TABLET | Freq: Every day | ORAL | Status: DC
  Administered 2022-04-18 – 2022-04-23 (×5): 81 mg via ORAL
  Filled 2022-04-18 (×5): qty 1

## 2022-04-18 MED ORDER — SODIUM CHLORIDE 0.9 % IV SOLN: 250.0000 mL | INTRAVENOUS | Status: DC | PRN

## 2022-04-18 MED ORDER — LABETALOL HCL 5 MG/ML IV SOLN
40.0000 mg | INTRAVENOUS | Status: DC | PRN
  Administered 2022-04-18: 40 mg via INTRAVENOUS
  Filled 2022-04-18: qty 8

## 2022-04-18 MED ORDER — CALCIUM CARBONATE ANTACID 500 MG PO CHEW
2.0000 | CHEWABLE_TABLET | ORAL | Status: DC | PRN
  Administered 2022-04-19 (×2): 400 mg via ORAL
  Filled 2022-04-18 (×3): qty 2

## 2022-04-18 MED ORDER — LACTATED RINGERS IV BOLUS: 1000.0000 mL | Freq: Once | INTRAVENOUS | Status: DC

## 2022-04-18 MED ORDER — NIFEDIPINE ER OSMOTIC RELEASE 60 MG PO TB24
90.0000 mg | ORAL_TABLET | Freq: Every day | ORAL | Status: DC
  Administered 2022-04-18 – 2022-04-23 (×6): 90 mg via ORAL
  Filled 2022-04-18 (×6): qty 1

## 2022-04-18 MED ORDER — POTASSIUM CHLORIDE 10 MEQ/100ML IV SOLN
10.0000 meq | INTRAVENOUS | Status: AC
  Administered 2022-04-18 – 2022-04-19 (×4): 10 meq via INTRAVENOUS
  Filled 2022-04-18 (×2): qty 100

## 2022-04-18 NOTE — MAU Note (Signed)
Patient arrived to MAU complaining of nausea , vomiting and back pain for 2 days. Patient stated that she tried to take medication for the vomiting and back pain but could not keep the medication down. Patient also reports that she has not felt the baby move since 2 days ago.   Patient denies vaginal bleeding and or leakage of fluid.

## 2022-04-18 NOTE — MAU Provider Note (Deleted)
History     CSN: [id number]  Arrival date and time: 04/18/22 1304   Event Date/Time   First Provider Initiated Contact with Patient 04/18/22 1354      Chief Complaint  Patient presents with   Nausea   Emesis   Back Pain   HPI  [name] is a 36 y.o. year old [id number] female at 92w1dweeks gestation who presents to MAU reporting N/V, back pain, and no FM for 2 days. She has tried to medication for the pain, but not able to keep it down. She denies VB. She has poorly controlled Type 1 DM. She reports her Dexacom device has not worked in 10 days. She tried to call [facility], "but they do not answer."   OB History     Gravida  3   Para  1   Term  0   Preterm  1   AB  1   Living  1      SAB  1   IAB  0   Ectopic  0   Multiple  0   Live Births  1           Past Medical History:  Diagnosis Date   Chronic hypertension    Complication of anesthesia    with hand surgery, local anes did not work, tried twice- had to put her to sleep   [place] [place]    [place]   [place] [place]   Obstetric vaginal laceration with type 3c third degree perineal laceration 06/29/2021   Pyelonephritis 10/2020   Rh negative status during pregnancy 05/01/2021   s/p Rhogam 05/13/21   Type I diabetes mellitus ([facility]    dx [place]ago   UTI (urinary tract infection)     Past Surgical History:  Procedure Laterality Date   female circumcision Bilateral    clitorectomy as well   FOOT SURGERY  2018   HAND SURGERY  2019    Family History  Problem Relation Age of Onset   Hyperlipidemia Mother    Hypertension Mother    Kidney disease Father    Alzheimer's disease Father     Social History   Tobacco Use   Smoking status: Never   Smokeless tobacco: Never  Vaping Use   Vaping Use: Never used  Substance Use Topics   Alcohol use: Never   Drug use: Never    Allergies:  Allergies  Allergen  Reactions   Pork-Derived Products     Medications Prior to Admission  Medication Sig Dispense Refill Last Dose   Accu-Chek Softclix Lancets lancets Use as instructed; check blood glucose 4 times daily 200 each 10    acetaminophen (TYLENOL) 500 MG tablet Take 1,000 mg by mouth every 6 (six) hours as needed.      aspirin (ASPIRIN LOW DOSE) 81 MG chewable tablet CHEW 1 TABLET BY MOUTH DAILY. 90 tablet 1    Blood Glucose Monitoring Suppl (ACCU-CHEK GUIDE) w/Device KIT use as directed 4 (four) times daily. 1 kit 0    Continuous Blood Gluc Transmit (DEXCOM G6 TRANSMITTER) MISC 1 Device by Does not apply route every 3 (three) months. 3 each 1    glucose blood (ACCU-CHEK GUIDE) test strip Use as instructed; check blood glucose 4 times daily 200 each 10    insulin aspart (NOVOLOG) 100 UNIT/ML injection Max daily 80 units 30 mL 11    Insulin Disposable Pump (OMNIPOD 5 G6 INTRO, GEN 5,)  KIT 1 Device by Does not apply route every 3 (three) days. (Patient not taking: Reported on 04/13/2022) 1 kit 0    Insulin Disposable Pump (OMNIPOD 5 G6 POD, GEN 5,) MISC 1 Device by Does not apply route every 3 (three) days. (Patient not taking: Reported on 04/13/2022) 6 each 3    insulin glargine (LANTUS) 100 UNIT/ML Solostar Pen Max daily 55 units 15 mL 6    Insulin Pen Needle 32G X 4 MM MISC 1 Device by Does not apply route in the morning, at noon, in the evening, and at bedtime. 200 each 11    labetalol (NORMODYNE) 300 MG tablet Take 1 tablet (300 mg total) by mouth 2 (two) times daily. 60 tablet 3    metoCLOPramide (REGLAN) 10 MG tablet Take 1 tablet (10 mg total) by mouth 3 (three) times daily before meals. 90 tablet 3    NIFEdipine (PROCARDIA XL/NIFEDICAL-XL) 90 MG 24 hr tablet Take 1 tablet (90 mg total) by mouth daily. 30 tablet 2    ondansetron (ZOFRAN-ODT) 4 MG disintegrating tablet Take 1 tablet (4 mg total) by mouth every 6 (six) hours as needed for nausea. 30 tablet 2    pantoprazole (PROTONIX) 20 MG tablet  Take 2 tablets (40 mg total) by mouth daily. 30 tablet 5    potassium chloride SA (KLOR-CON M) 20 MEQ tablet Take 2 tablets (40 mEq total) by mouth daily for 3 days. (Patient not taking: Reported on 04/13/2022) 6 tablet 0    Prenatal Vit-Fe Fumarate-FA (PRENATAL VITAMIN) 27-0.8 MG TABS Take 1 tablet by mouth daily. 90 tablet 2    prochlorperazine (COMPAZINE) 10 MG tablet Take 1 tablet (10 mg total) by mouth 2 (two) times daily as needed for nausea or vomiting. (Patient not taking: Reported on 04/14/2022) 30 tablet 2    promethazine (PHENERGAN) 25 MG suppository Place 1 suppository (25 mg total) rectally every 6 (six) hours as needed for nausea or vomiting. (Patient not taking: Reported on 04/06/2022) 12 each 0     Review of Systems  Constitutional:  Positive for appetite change and fatigue.  HENT: Negative.    Eyes: Negative.   Respiratory: Negative.    Cardiovascular: Negative.   Gastrointestinal:  Positive for nausea and vomiting.  Musculoskeletal:  Positive for back pain (moderate to severe).  Skin: Negative.   Physical Exam   Patient Vitals for the past 24 hrs:  BP Pulse SpO2  04/18/22 1535 -- -- 99 %  04/18/22 1530 129/80 100 100 %  04/18/22 1525 -- -- 99 %  04/18/22 1520 -- -- 99 %  04/18/22 1515 113/75 (!) 105 99 %  04/18/22 1510 -- -- 99 %  04/18/22 1500 (!) 149/92 (!) 101 100 %  04/18/22 1455 -- -- 99 %  04/18/22 1450 -- -- 99 %  04/18/22 1449 136/86 98 --  04/18/22 1445 -- -- 99 %  04/18/22 1440 -- -- 100 %  04/18/22 1435 (!) 166/106 (!) 106 99 %  04/18/22 1430 (!) 171/97 (!) 106 100 %  04/18/22 1425 -- -- 100 %  04/18/22 1420 -- -- 99 %  04/18/22 1416 (!) 150/91 (!) 103 --  04/18/22 1415 -- -- 100 %  04/18/22 1400 -- -- 100 %  04/18/22 1355 -- -- 100 %  04/18/22 1350 -- -- 100 %  04/18/22 1346 (!) 163/83 94 --  04/18/22 1345 -- -- 100 %  04/18/22 1341 (!) 166/88 93 --  04/18/22 1340 -- -- 100 %  04/18/22  1335 -- -- 100 %  04/18/22 1332 (!) 171/113 (!) 107 100 %     Physical Exam Vitals and nursing note reviewed.  Constitutional:      Appearance: She is normal weight. She is ill-appearing.  Cardiovascular:     Rate and Rhythm: Regular rhythm. Tachycardia present.  Pulmonary:     Effort: Pulmonary effort is normal.     Breath sounds: Normal breath sounds.  Abdominal:     General: Bowel sounds are decreased.     Palpations: Abdomen is soft.  Genitourinary:    Comments: deferred Skin:    General: Skin is warm and dry.  Neurological:     Mental Status: She is alert.  Psychiatric:        Mood and Affect: Mood normal.        Behavior: Behavior normal.        Thought Content: Thought content normal.        Judgment: Judgment normal.   REACTIVE NST - FHR: 145 bpm / moderate variability / accels present / decels absent / TOCO: Occ irregular UC's with UI noted MAU Course  Procedures  MDM CCUA CBC CMP P/C Ratio Serial BP's   *Consult with Dr. Kennon Rounds @ [phone] - notified of patient's complaints, assessments, lab & U/S results, recommended tx plan admit to [facility] for [facility]KA  Dr. Kennon Rounds will enter orders.  Results for orders placed or performed during the hospital encounter of 04/18/22 (from the past 24 hour(s))  CBC     Status: Abnormal   Collection Time: 04/18/22  2:18 PM  Result Value Ref Range   WBC 8.0 4.0 - 10.5 K/uL   RBC 5.69 (H) 3.87 - 5.11 MIL/uL   Hemoglobin 16.6 (H) 12.0 - 15.0 g/dL   HCT 49.2 (H) 36.0 - 46.0 %   MCV 86.5 80.0 - 100.0 fL   MCH 29.2 26.0 - 34.0 pg   MCHC 33.7 30.0 - 36.0 g/dL   RDW 14.4 11.5 - 15.5 %   Platelets 281 150 - 400 K/uL   nRBC 0.0 0.0 - 0.2 %  Comprehensive metabolic panel     Status: Abnormal   Collection Time: 04/18/22  2:18 PM  Result Value Ref Range   Sodium 138 135 - 145 mmol/L   Potassium 3.2 (L) 3.5 - 5.1 mmol/L   Chloride 105 98 - 111 mmol/L   CO2 17 (L) 22 - 32 mmol/L   Glucose, Bld 226 (H) 70 - 99 mg/dL   BUN 9 6 - 20 mg/dL   Creatinine, Ser 0.63 0.44 - 1.00 mg/dL   Calcium 9.0 8.9 -  10.3 mg/dL   Total Protein 7.9 6.5 - 8.1 g/dL   Albumin 3.1 (L) 3.5 - 5.0 g/dL   AST 12 (L) 15 - 41 U/L   ALT 9 0 - 44 U/L   Alkaline Phosphatase 85 38 - 126 U/L   Total Bilirubin 0.9 0.3 - 1.2 mg/dL   GFR, Estimated >60 >60 mL/min   Anion gap 16 (H) 5 - 15  Protein / creatinine ratio, urine     Status: None   Collection Time: 04/18/22  2:18 PM  Result Value Ref Range   Creatinine, Urine 106.62 mg/dL   Total Protein, Urine 790 mg/dL   Protein Creatinine Ratio RESULT BELOW REPORTABLE RANGE,  UNABLE TO CALCULATE.       0.00 - 0.15 mg/mg[Cre]  Urinalysis, Routine w reflex microscopic Urine, Clean Catch     Status: Abnormal   Collection Time:  04/18/22  2:18 PM  Result Value Ref Range   Color, Urine YELLOW YELLOW   APPearance CLOUDY (A) CLEAR   Specific Gravity, Urine 1.025 1.005 - 1.030   pH 5.0 5.0 - 8.0   Glucose, UA >=500 (A) NEGATIVE mg/dL   Hgb urine dipstick SMALL (A) NEGATIVE   Bilirubin Urine NEGATIVE NEGATIVE   Ketones, ur 80 (A) NEGATIVE mg/dL   Protein, ur >=300 (A) NEGATIVE mg/dL   Nitrite NEGATIVE NEGATIVE   Leukocytes,Ua SMALL (A) NEGATIVE   RBC / HPF 21-50 0 - 5 RBC/hpf   WBC, UA 11-20 0 - 5 WBC/hpf   Bacteria, UA RARE (A) NONE SEEN   Squamous Epithelial / LPF 11-20 0 - 5   Mucus PRESENT    Hyaline Casts, UA PRESENT   Beta-hydroxybutyric acid     Status: Abnormal   Collection Time: 04/18/22  3:06 PM  Result Value Ref Range   Beta-Hydroxybutyric Acid 4.16 (H) 0.05 - 0.27 mmol/L     Assessment and Plan  1. Type 1 diabetes mellitus with ketoacidosis without coma ([place]) - Admit to OBSCU - See Dr. Virginia Crews orders  2. Nausea and vomiting, unspecified vomiting type  3. [redacted] weeks gestation of pregnancy   Laury Deep, [place] 04/18/2022, 1:55 PM

## 2022-04-18 NOTE — H&P (Signed)
History     CSN: [id number]  Arrival date and time: 04/18/22 1304   Event Date/Time   First Provider Initiated Contact with Patient 04/18/22 1354      Chief Complaint  Patient presents with   Nausea   Emesis   Back Pain   HPI  Kristin Clay Every is a 36 y.o. year old [name] female at [redacted]w[redacted]d weeks gestation who presents to MAU reporting N/V, back pain, and no FM for 2 days. She has tried to medication for the pain, but not able to keep it down. She denies VB. She has poorly controlled Type 1 DM. She reports her Dexacom device has not worked in 10 days. She tried to call [facility], "but they do not answer."   OB History     Gravida  3   Para  1   Term  0   Preterm  1   AB  1   Living  1      SAB  1   IAB  0   Ectopic  0   Multiple  0   Live Births  1           Past Medical History:  Diagnosis Date   Chronic hypertension    Complication of anesthesia    with hand surgery, local anes did not work, tried twice- had to put her to sleep   Diabetic retinopathy (HCC)    DKA (diabetic ketoacidosis) (HCC) 10/2020   Epidural hematoma (HCC) 07/04/2021   Obstetric vaginal laceration with type 3c third degree perineal laceration 06/29/2021   Pyelonephritis 10/2020   Rh negative status during pregnancy 05/01/2021   s/p Rhogam 05/13/21   Type I diabetes mellitus (HCC)    dx 13yrs ago   UTI (urinary tract infection)     Past Surgical History:  Procedure Laterality Date   female circumcision Bilateral    clitorectomy as well   FOOT SURGERY  2018   HAND SURGERY  2019    Family History  Problem Relation Age of Onset   Hyperlipidemia Mother    Hypertension Mother    Kidney disease Father    Alzheimer's disease Father     Social History   Tobacco Use   Smoking status: Never   Smokeless tobacco: Never  Vaping Use   Vaping Use: Never used  Substance Use Topics   Alcohol use: Never   Drug use: Never    Allergies:  Allergies  Allergen  Reactions   Pork-Derived Products     Medications Prior to Admission  Medication Sig Dispense Refill Last Dose   Accu-Chek Softclix Lancets lancets Use as instructed; check blood glucose 4 times daily 200 each 10    acetaminophen (TYLENOL) 500 MG tablet Take 1,000 mg by mouth every 6 (six) hours as needed.      aspirin (ASPIRIN LOW DOSE) 81 MG chewable tablet CHEW 1 TABLET BY MOUTH DAILY. 90 tablet 1    Blood Glucose Monitoring Suppl (ACCU-CHEK GUIDE) w/Device KIT use as directed 4 (four) times daily. 1 kit 0    Continuous Blood Gluc Transmit (DEXCOM G6 TRANSMITTER) MISC 1 Device by Does not apply route every 3 (three) months. 3 each 1    glucose blood (ACCU-CHEK GUIDE) test strip Use as instructed; check blood glucose 4 times daily 200 each 10    insulin aspart (NOVOLOG) 100 UNIT/ML injection Max daily 80 units 30 mL 11    Insulin Disposable Pump (OMNIPOD 5 G6 INTRO, GEN 5,)   KIT 1 Device by Does not apply route every 3 (three) days. (Patient not taking: Reported on 04/13/2022) 1 kit 0    Insulin Disposable Pump (OMNIPOD 5 G6 POD, GEN 5,) MISC 1 Device by Does not apply route every 3 (three) days. (Patient not taking: Reported on 04/13/2022) 6 each 3    insulin glargine (LANTUS) 100 UNIT/ML Solostar Pen Max daily 55 units 15 mL 6    Insulin Pen Needle 32G X 4 MM MISC 1 Device by Does not apply route in the morning, at noon, in the evening, and at bedtime. 200 each 11    labetalol (NORMODYNE) 300 MG tablet Take 1 tablet (300 mg total) by mouth 2 (two) times daily. 60 tablet 3    metoCLOPramide (REGLAN) 10 MG tablet Take 1 tablet (10 mg total) by mouth 3 (three) times daily before meals. 90 tablet 3    NIFEdipine (PROCARDIA XL/NIFEDICAL-XL) 90 MG 24 hr tablet Take 1 tablet (90 mg total) by mouth daily. 30 tablet 2    ondansetron (ZOFRAN-ODT) 4 MG disintegrating tablet Take 1 tablet (4 mg total) by mouth every 6 (six) hours as needed for nausea. 30 tablet 2    pantoprazole (PROTONIX) 20 MG tablet  Take 2 tablets (40 mg total) by mouth daily. 30 tablet 5    potassium chloride SA (KLOR-CON M) 20 MEQ tablet Take 2 tablets (40 mEq total) by mouth daily for 3 days. (Patient not taking: Reported on 04/13/2022) 6 tablet 0    Prenatal Vit-Fe Fumarate-FA (PRENATAL VITAMIN) 27-0.8 MG TABS Take 1 tablet by mouth daily. 90 tablet 2    prochlorperazine (COMPAZINE) 10 MG tablet Take 1 tablet (10 mg total) by mouth 2 (two) times daily as needed for nausea or vomiting. (Patient not taking: Reported on 04/14/2022) 30 tablet 2    promethazine (PHENERGAN) 25 MG suppository Place 1 suppository (25 mg total) rectally every 6 (six) hours as needed for nausea or vomiting. (Patient not taking: Reported on 04/06/2022) 12 each 0     Review of Systems  Constitutional:  Positive for appetite change and fatigue.  HENT: Negative.    Eyes: Negative.   Respiratory: Negative.    Cardiovascular: Negative.   Gastrointestinal:  Positive for nausea and vomiting.  Musculoskeletal:  Positive for back pain (moderate to severe).  Skin: Negative.   Physical Exam   Patient Vitals for the past 24 hrs:  BP Pulse SpO2  04/18/22 1535 -- -- 99 %  04/18/22 1530 129/80 100 100 %  04/18/22 1525 -- -- 99 %  04/18/22 1520 -- -- 99 %  04/18/22 1515 113/75 (!) 105 99 %  04/18/22 1510 -- -- 99 %  04/18/22 1500 (!) 149/92 (!) 101 100 %  04/18/22 1455 -- -- 99 %  04/18/22 1450 -- -- 99 %  04/18/22 1449 136/86 98 --  04/18/22 1445 -- -- 99 %  04/18/22 1440 -- -- 100 %  04/18/22 1435 (!) 166/106 (!) 106 99 %  04/18/22 1430 (!) 171/97 (!) 106 100 %  04/18/22 1425 -- -- 100 %  04/18/22 1420 -- -- 99 %  04/18/22 1416 (!) 150/91 (!) 103 --  04/18/22 1415 -- -- 100 %  04/18/22 1400 -- -- 100 %  04/18/22 1355 -- -- 100 %  04/18/22 1350 -- -- 100 %  04/18/22 1346 (!) 163/83 94 --  04/18/22 1345 -- -- 100 %  04/18/22 1341 (!) 166/88 93 --  04/18/22 1340 -- -- 100 %  04/18/22   1335 -- -- 100 %  04/18/22 1332 (!) 171/113 (!) 107 100 %     Physical Exam Vitals and nursing note reviewed.  Constitutional:      Appearance: She is normal weight. She is ill-appearing.  Cardiovascular:     Rate and Rhythm: Regular rhythm. Tachycardia present.  Pulmonary:     Effort: Pulmonary effort is normal.     Breath sounds: Normal breath sounds.  Abdominal:     General: Bowel sounds are decreased.     Palpations: Abdomen is soft.  Genitourinary:    Comments: deferred Skin:    General: Skin is warm and dry.  Neurological:     Mental Status: She is alert.  Psychiatric:        Mood and Affect: Mood normal.        Behavior: Behavior normal.        Thought Content: Thought content normal.        Judgment: Judgment normal.   REACTIVE NST - FHR: 145 bpm / moderate variability / accels present / decels absent / TOCO: Occ irregular UC's with UI noted MAU Course  Procedures  MDM CCUA CBC CMP P/C Ratio Serial BP's   *Consult with Dr. Pratt @ 1605 - notified of patient's complaints, assessments, lab & U/S results, recommended tx plan admit to OBSCU for DKA  Dr. Pratt will enter orders.  Results for orders placed or performed during the hospital encounter of 04/18/22 (from the past 24 hour(s))  CBC     Status: Abnormal   Collection Time: 04/18/22  2:18 PM  Result Value Ref Range   WBC 8.0 4.0 - 10.5 K/uL   RBC 5.69 (Clay) 3.87 - 5.11 MIL/uL   Hemoglobin 16.6 (Clay) 12.0 - 15.0 g/dL   HCT 49.2 (Clay) 36.0 - 46.0 %   MCV 86.5 80.0 - 100.0 fL   MCH 29.2 26.0 - 34.0 pg   MCHC 33.7 30.0 - 36.0 g/dL   RDW 14.4 11.5 - 15.5 %   Platelets 281 150 - 400 K/uL   nRBC 0.0 0.0 - 0.2 %  Comprehensive metabolic panel     Status: Abnormal   Collection Time: 04/18/22  2:18 PM  Result Value Ref Range   Sodium 138 135 - 145 mmol/L   Potassium 3.2 (L) 3.5 - 5.1 mmol/L   Chloride 105 98 - 111 mmol/L   CO2 17 (L) 22 - 32 mmol/L   Glucose, Bld 226 (Clay) 70 - 99 mg/dL   BUN 9 6 - 20 mg/dL   Creatinine, Ser 0.63 0.44 - 1.00 mg/dL   Calcium 9.0 8.9 -  10.3 mg/dL   Total Protein 7.9 6.5 - 8.1 g/dL   Albumin 3.1 (L) 3.5 - 5.0 g/dL   AST 12 (L) 15 - 41 U/L   ALT 9 0 - 44 U/L   Alkaline Phosphatase 85 38 - 126 U/L   Total Bilirubin 0.9 0.3 - 1.2 mg/dL   GFR, Estimated >60 >60 mL/min   Anion gap 16 (Clay) 5 - 15  Protein / creatinine ratio, urine     Status: None   Collection Time: 04/18/22  2:18 PM  Result Value Ref Range   Creatinine, Urine 106.62 mg/dL   Total Protein, Urine 790 mg/dL   Protein Creatinine Ratio RESULT BELOW REPORTABLE RANGE,  UNABLE TO CALCULATE.       0.00 - 0.15 mg/mg[Cre]  Urinalysis, Routine w reflex microscopic Urine, Clean Catch     Status: Abnormal   Collection Time:   04/18/22  2:18 PM  Result Value Ref Range   Color, Urine YELLOW YELLOW   APPearance CLOUDY (A) CLEAR   Specific Gravity, Urine 1.025 1.005 - 1.030   pH 5.0 5.0 - 8.0   Glucose, UA >=500 (A) NEGATIVE mg/dL   Hgb urine dipstick SMALL (A) NEGATIVE   Bilirubin Urine NEGATIVE NEGATIVE   Ketones, ur 80 (A) NEGATIVE mg/dL   Protein, ur >=300 (A) NEGATIVE mg/dL   Nitrite NEGATIVE NEGATIVE   Leukocytes,Ua SMALL (A) NEGATIVE   RBC / HPF 21-50 0 - 5 RBC/hpf   WBC, UA 11-20 0 - 5 WBC/hpf   Bacteria, UA RARE (A) NONE SEEN   Squamous Epithelial / LPF 11-20 0 - 5   Mucus PRESENT    Hyaline Casts, UA PRESENT   Beta-hydroxybutyric acid     Status: Abnormal   Collection Time: 04/18/22  3:06 PM  Result Value Ref Range   Beta-Hydroxybutyric Acid 4.16 (Clay) 0.05 - 0.27 mmol/L     Assessment and Plan  1. Type 1 diabetes mellitus with ketoacidosis without coma (HCC) - Admit to OBSCU - See Dr. Pratt's orders  2. Nausea and vomiting, unspecified vomiting type  3. [redacted] weeks gestation of pregnancy   Josee Speece, CNM 04/18/2022, 1:55 PM  

## 2022-04-18 NOTE — MAU Note (Signed)
Notified Main Lab of patients betahydroxybutyric acid add on. Lab stated it needs to be collected in a dark green tube. [facility] phlebotomy notified.

## 2022-04-19 ENCOUNTER — Other Ambulatory Visit: Payer: Self-pay

## 2022-04-19 DIAGNOSIS — E1010 Type 1 diabetes mellitus with ketoacidosis without coma: Secondary | ICD-10-CM | POA: Diagnosis not present

## 2022-04-19 LAB — GLUCOSE, CAPILLARY
Glucose-Capillary: 104 mg/dL — ABNORMAL HIGH (ref 70–99)
Glucose-Capillary: 110 mg/dL — ABNORMAL HIGH (ref 70–99)
Glucose-Capillary: 113 mg/dL — ABNORMAL HIGH (ref 70–99)
Glucose-Capillary: 121 mg/dL — ABNORMAL HIGH (ref 70–99)
Glucose-Capillary: 122 mg/dL — ABNORMAL HIGH (ref 70–99)
Glucose-Capillary: 126 mg/dL — ABNORMAL HIGH (ref 70–99)
Glucose-Capillary: 131 mg/dL — ABNORMAL HIGH (ref 70–99)
Glucose-Capillary: 132 mg/dL — ABNORMAL HIGH (ref 70–99)
Glucose-Capillary: 136 mg/dL — ABNORMAL HIGH (ref 70–99)
Glucose-Capillary: 138 mg/dL — ABNORMAL HIGH (ref 70–99)
Glucose-Capillary: 140 mg/dL — ABNORMAL HIGH (ref 70–99)
Glucose-Capillary: 147 mg/dL — ABNORMAL HIGH (ref 70–99)
Glucose-Capillary: 175 mg/dL — ABNORMAL HIGH (ref 70–99)
Glucose-Capillary: 176 mg/dL — ABNORMAL HIGH (ref 70–99)
Glucose-Capillary: 92 mg/dL (ref 70–99)
Glucose-Capillary: 92 mg/dL (ref 70–99)
Glucose-Capillary: 94 mg/dL (ref 70–99)
Glucose-Capillary: 95 mg/dL (ref 70–99)
Glucose-Capillary: 98 mg/dL (ref 70–99)

## 2022-04-19 LAB — BASIC METABOLIC PANEL
Anion gap: 10 (ref 5–15)
Anion gap: 7 (ref 5–15)
Anion gap: 8 (ref 5–15)
Anion gap: 9 (ref 5–15)
BUN: 10 mg/dL (ref 6–20)
BUN: 10 mg/dL (ref 6–20)
BUN: 8 mg/dL (ref 6–20)
BUN: <5 mg/dL — ABNORMAL LOW (ref 6–20)
CO2: 18 mmol/L — ABNORMAL LOW (ref 22–32)
CO2: 18 mmol/L — ABNORMAL LOW (ref 22–32)
CO2: 19 mmol/L — ABNORMAL LOW (ref 22–32)
CO2: 21 mmol/L — ABNORMAL LOW (ref 22–32)
Calcium: 8.1 mg/dL — ABNORMAL LOW (ref 8.9–10.3)
Calcium: 8.3 mg/dL — ABNORMAL LOW (ref 8.9–10.3)
Calcium: 8.3 mg/dL — ABNORMAL LOW (ref 8.9–10.3)
Calcium: 8.5 mg/dL — ABNORMAL LOW (ref 8.9–10.3)
Chloride: 110 mmol/L (ref 98–111)
Chloride: 111 mmol/L (ref 98–111)
Chloride: 112 mmol/L — ABNORMAL HIGH (ref 98–111)
Chloride: 113 mmol/L — ABNORMAL HIGH (ref 98–111)
Creatinine, Ser: 0.38 mg/dL — ABNORMAL LOW (ref 0.44–1.00)
Creatinine, Ser: 0.44 mg/dL (ref 0.44–1.00)
Creatinine, Ser: 0.61 mg/dL (ref 0.44–1.00)
Creatinine, Ser: 0.63 mg/dL (ref 0.44–1.00)
GFR, Estimated: >60 mL/min (ref >60)
GFR, Estimated: >60 mL/min (ref >60)
GFR, Estimated: >60 mL/min (ref >60)
GFR, Estimated: >60 mL/min (ref >60)
Glucose, Bld: 117 mg/dL — ABNORMAL HIGH (ref 70–99)
Glucose, Bld: 149 mg/dL — ABNORMAL HIGH (ref 70–99)
Glucose, Bld: 173 mg/dL — ABNORMAL HIGH (ref 70–99)
Glucose, Bld: 83 mg/dL (ref 70–99)
Potassium: 2.9 mmol/L — ABNORMAL LOW (ref 3.5–5.1)
Potassium: 3.1 mmol/L — ABNORMAL LOW (ref 3.5–5.1)
Potassium: 3.4 mmol/L — ABNORMAL LOW (ref 3.5–5.1)
Potassium: 3.4 mmol/L — ABNORMAL LOW (ref 3.5–5.1)
Sodium: 137 mmol/L (ref 135–145)
Sodium: 139 mmol/L (ref 135–145)
Sodium: 139 mmol/L (ref 135–145)
Sodium: 141 mmol/L (ref 135–145)

## 2022-04-19 LAB — BETA-HYDROXYBUTYRIC ACID
Beta-Hydroxybutyric Acid: 0.07 mmol/L (ref 0.05–0.27)
Beta-Hydroxybutyric Acid: 0.59 mmol/L — ABNORMAL HIGH (ref 0.05–0.27)

## 2022-04-19 MED ORDER — LABETALOL HCL 5 MG/ML IV SOLN
20.0000 mg | INTRAVENOUS | Status: DC | PRN
  Administered 2022-04-19 – 2022-04-21 (×3): 20 mg via INTRAVENOUS
  Filled 2022-04-19 (×3): qty 4

## 2022-04-19 MED ORDER — FENTANYL CITRATE (PF) 100 MCG/2ML IJ SOLN
25.0000 ug | INTRAMUSCULAR | Status: DC | PRN
  Administered 2022-04-19: 25 ug via INTRAVENOUS
  Filled 2022-04-19: qty 2

## 2022-04-19 MED ORDER — LABETALOL HCL 5 MG/ML IV SOLN: 80.0000 mg | INTRAVENOUS | Status: DC | PRN

## 2022-04-19 MED ORDER — POTASSIUM CHLORIDE CRYS ER 20 MEQ PO TBCR
40.0000 meq | EXTENDED_RELEASE_TABLET | Freq: Two times a day (BID) | ORAL | Status: DC
Start: 2022-04-19 — End: 2022-04-22
  Administered 2022-04-19 – 2022-04-22 (×6): 40 meq via ORAL
  Filled 2022-04-19 (×7): qty 2

## 2022-04-19 MED ORDER — HYDRALAZINE HCL 20 MG/ML IJ SOLN: 10.0000 mg | INTRAMUSCULAR | Status: DC | PRN

## 2022-04-19 MED ORDER — LABETALOL HCL 5 MG/ML IV SOLN
40.0000 mg | INTRAVENOUS | Status: DC | PRN
  Administered 2022-04-19: 40 mg via INTRAVENOUS
  Filled 2022-04-19: qty 8

## 2022-04-19 MED ORDER — SCOPOLAMINE 1 MG/3DAYS TD PT72
1.0000 | MEDICATED_PATCH | TRANSDERMAL | Status: DC
  Administered 2022-04-19 – 2022-04-22 (×2): 1.5 mg via TRANSDERMAL
  Filled 2022-04-19 (×2): qty 1

## 2022-04-19 MED ORDER — POTASSIUM CHLORIDE 10 MEQ/100ML IV SOLN
10.0000 meq | Freq: Once | INTRAVENOUS | Status: AC
  Administered 2022-04-19: 10 meq via INTRAVENOUS

## 2022-04-19 MED ORDER — POTASSIUM CHLORIDE 10 MEQ/100ML IV SOLN
10.0000 meq | INTRAVENOUS | Status: AC
  Administered 2022-04-19 (×2): 10 meq via INTRAVENOUS
  Filled 2022-04-19 (×3): qty 100

## 2022-04-19 MED ORDER — METOCLOPRAMIDE HCL 5 MG/ML IJ SOLN
5.0000 mg | Freq: Four times a day (QID) | INTRAMUSCULAR | Status: DC
  Administered 2022-04-20 – 2022-04-21 (×8): 5 mg via INTRAVENOUS
  Filled 2022-04-19 (×8): qty 2

## 2022-04-19 MED ORDER — GLYCOPYRROLATE 1 MG PO TABS: 2.0000 mg | ORAL_TABLET | Freq: Three times a day (TID) | ORAL | Status: DC | PRN

## 2022-04-19 MED ORDER — POTASSIUM CHLORIDE 10 MEQ/100ML IV SOLN
10.0000 meq | INTRAVENOUS | Status: AC
  Filled 2022-04-19: qty 100

## 2022-04-19 NOTE — Progress Notes (Addendum)
Kristin Clay is a 36 y.o. [phone] at 106w2d who is admitted for DKA.  Estimated Date of Delivery: 07/03/22 Fetal presentation is breech as of 5/23.   Length of Stay:  1 Days. Admitted 04/18/2022   Subjective: She is having some ongoing emesis (appears more c/w spitting while I was in the room). She does report feeling overall better.  Patient no fetal movement (NST reactive overnight).  She reports no uterine contractions, no bleeding and no loss of fluid per vagina.   Vitals:  Blood pressure 106/62, pulse 99, temperature 98.2 F (36.8 C), temperature source Oral, resp. rate 18, last menstrual period 09/20/2021, SpO2 100 %, not currently breastfeeding. Physical Examination: CONSTITUTIONAL: Well-developed, well-nourished female in no acute distress.  NEUROLOGIC: Alert and oriented to person, place, and time. No cranial nerve deficit noted. PSYCHIATRIC: Normal mood and affect. Normal behavior. Normal judgment and thought content. CARDIOVASCULAR: Normal heart rate noted, regular rhythm RESPIRATORY: Effort and breath sounds normal, no problems with respiration noted MUSCULOSKELETAL: Normal range of motion. No edema and no tenderness. 2+ distal pulses. ABDOMEN: Soft, nontender, nondistended, gravid. CERVIX:     Fetal monitoring: FHR: 150 bpm, Variability: moderate, Accelerations: Present 10x10, Decelerations: Absent  Uterine activity: no contractions    Lab Results Last 48 Hours        Results for orders placed or performed during the hospital encounter of 04/18/22 (from the past 48 hour(s))  CBC     Status: Abnormal    Collection Time: 04/18/22  2:18 PM  Result Value Ref Range    WBC 8.0 4.0 - 10.5 K/uL    RBC 5.69 (H) 3.87 - 5.11 MIL/uL    Hemoglobin 16.6 (H) 12.0 - 15.0 g/dL    HCT 49.2 (H) 36.0 - 46.0 %    MCV 86.5 80.0 - 100.0 fL    MCH 29.2 26.0 - 34.0 pg    MCHC 33.7 30.0 - 36.0 g/dL    RDW 14.4 11.5 - 15.5 %     Platelets 281 150 - 400 K/uL    nRBC 0.0 0.0 - 0.2 %      Comment: Performed at [facility], [place] [address], [place], [place] [address]  Comprehensive metabolic panel     Status: Abnormal    Collection Time: 04/18/22  2:18 PM  Result Value Ref Range    Sodium 138 135 - 145 mmol/L    Potassium 3.2 (L) 3.5 - 5.1 mmol/L    Chloride 105 98 - 111 mmol/L    CO2 17 (L) 22 - 32 mmol/L    Glucose, Bld 226 (H) 70 - 99 mg/dL      Comment: Glucose reference range applies only to samples taken after fasting for at least 8 hours.    BUN 9 6 - 20 mg/dL    Creatinine, Ser 0.63 0.44 - 1.00 mg/dL    Calcium 9.0 8.9 - 10.3 mg/dL    Total Protein 7.9 6.5 - 8.1 g/dL    Albumin 3.1 (L) 3.5 - 5.0 g/dL    AST 12 (L) 15 - 41 U/L    ALT 9 0 - 44 U/L    Alkaline Phosphatase 85 38 - 126 U/L    Total Bilirubin 0.9 0.3 - 1.2 mg/dL    GFR, Estimated >60 >60 mL/min      Comment: (NOTE) Calculated using the CKD-EPI Creatinine Equation (2021)      Anion gap 16 (H) 5 -  15      Comment: Performed at [facility], [place] [address], [place], [place] [address]  Protein / creatinine ratio, urine     Status: None    Collection Time: 04/18/22  2:18 PM  Result Value Ref Range    Creatinine, Urine 106.62 mg/dL    Total Protein, Urine 790 mg/dL      Comment: NO NORMAL RANGE ESTABLISHED FOR THIS TEST RESULTS CONFIRMED BY MANUAL DILUTION      Protein Creatinine Ratio        0.00 - 0.15 mg/mg[Cre]      Comment: RESULT BELOW REPORTABLE RANGE, UNABLE TO CALCULATE. Performed at [facility], [place] [address], [place], [place] [address]    Urinalysis, Routine w reflex microscopic Urine, Clean Catch     Status: Abnormal    Collection Time: 04/18/22  2:18 PM  Result Value Ref Range    Color, Urine YELLOW YELLOW    APPearance CLOUDY (A) CLEAR    Specific Gravity, Urine 1.025 1.005 - 1.030    pH 5.0 5.0 - 8.0    Glucose, UA >=500 (A) NEGATIVE mg/dL    Hgb urine dipstick SMALL (A) NEGATIVE    Bilirubin  Urine NEGATIVE NEGATIVE    Ketones, ur 80 (A) NEGATIVE mg/dL    Protein, ur >=300 (A) NEGATIVE mg/dL    Nitrite NEGATIVE NEGATIVE    Leukocytes,Ua SMALL (A) NEGATIVE    RBC / HPF 21-50 0 - 5 RBC/hpf    WBC, UA 11-20 0 - 5 WBC/hpf    Bacteria, UA RARE (A) NONE SEEN    Squamous Epithelial / LPF 11-20 0 - 5    Mucus PRESENT      Hyaline Casts, UA PRESENT        Comment: Performed at [facility], [place] [address] [place], [place] [address]  Beta-hydroxybutyric acid     Status: Abnormal    Collection Time: 04/18/22  3:06 PM  Result Value Ref Range    Beta-Hydroxybutyric Acid 4.16 (H) 0.05 - 0.27 mmol/L      Comment: Performed at [facility] [address], [place], [place] [id number]  Basic metabolic panel     Status: Abnormal    Collection Time: 04/18/22  7:58 PM  Result Value Ref Range    Sodium 139 135 - 145 mmol/L    Potassium 3.4 (L) 3.5 - 5.1 mmol/L    Chloride 108 98 - 111 mmol/L    CO2 17 (L) 22 - 32 mmol/L    Glucose, Bld 231 (H) 70 - 99 mg/dL      Comment: Glucose reference range applies only to samples taken after fasting for at least 8 hours.    BUN 8 6 - 20 mg/dL    Creatinine, Ser 0.70 0.44 - 1.00 mg/dL    Calcium 8.5 (L) 8.9 - 10.3 mg/dL    GFR, Estimated >60 >60 mL/min      Comment: (NOTE) Calculated using the CKD-EPI Creatinine Equation (2021)      Anion gap 14 5 - 15      Comment: Performed at [facility] [address] [place], [place] [address]  Glucose, capillary     Status: Abnormal    Collection Time: 04/18/22  8:34 PM  Result Value Ref Range    Glucose-Capillary 190 (H) 70 - 99 mg/dL      Comment: Glucose reference range applies only to samples taken after fasting for at least  8 hours.  Glucose, capillary     Status: Abnormal    Collection Time: 04/18/22  9:42 PM  Result Value Ref Range    Glucose-Capillary 178 (H) 70 - 99 mg/dL      Comment: Glucose reference range applies only to samples taken after fasting for at least 8 hours.   Glucose, capillary     Status: Abnormal    Collection Time: 04/18/22 10:47 PM  Result Value Ref Range    Glucose-Capillary 174 (H) 70 - 99 mg/dL      Comment: Glucose reference range applies only to samples taken after fasting for at least 8 hours.  Basic metabolic panel     Status: Abnormal    Collection Time: 04/18/22 11:55 PM  Result Value Ref Range    Sodium 141 135 - 145 mmol/L    Potassium 3.1 (L) 3.5 - 5.1 mmol/L    Chloride 113 (H) 98 - 111 mmol/L    CO2 18 (L) 22 - 32 mmol/L    Glucose, Bld 173 (H) 70 - 99 mg/dL      Comment: Glucose reference range applies only to samples taken after fasting for at least 8 hours.    BUN 10 6 - 20 mg/dL    Creatinine, Ser 0.63 0.44 - 1.00 mg/dL    Calcium 8.3 (L) 8.9 - 10.3 mg/dL    GFR, Estimated >60 >60 mL/min      Comment: (NOTE) Calculated using the CKD-EPI Creatinine Equation (2021)      Anion gap 10 5 - 15      Comment: Performed at [facility] [address], [place], [place] [address]  Beta-hydroxybutyric acid     Status: Abnormal    Collection Time: 04/18/22 11:55 PM  Result Value Ref Range    Beta-Hydroxybutyric Acid 0.59 (H) 0.05 - 0.27 mmol/L      Comment: Performed at [facility] [address] [place], [place] [address]  Glucose, capillary     Status: Abnormal    Collection Time: 04/18/22 11:58 PM  Result Value Ref Range    Glucose-Capillary 175 (H) 70 - 99 mg/dL      Comment: Glucose reference range applies only to samples taken after fasting for at least 8 hours.  Glucose, capillary     Status: Abnormal    Collection Time: 04/19/22  1:03 AM  Result Value Ref Range    Glucose-Capillary 131 (H) 70 - 99 mg/dL      Comment: Glucose reference range applies only to samples taken after fasting for at least 8 hours.  Glucose, capillary     Status: Abnormal    Collection Time: 04/19/22  2:01 AM  Result Value Ref Range    Glucose-Capillary 147 (H) 70 - 99 mg/dL      Comment: Glucose reference range applies  only to samples taken after fasting for at least 8 hours.  Glucose, capillary     Status: Abnormal    Collection Time: 04/19/22  3:01 AM  Result Value Ref Range    Glucose-Capillary 176 (H) 70 - 99 mg/dL      Comment: Glucose reference range applies only to samples taken after fasting for at least 8 hours.  Glucose, capillary     Status: Abnormal    Collection Time: 04/19/22  4:09 AM  Result Value Ref Range    Glucose-Capillary 132 (H) 70 - 99 mg/dL      Comment: Glucose reference range applies only to samples taken  after fasting for at least 8 hours.  Basic metabolic panel     Status: Abnormal    Collection Time: 04/19/22  4:36 AM  Result Value Ref Range    Sodium 139 135 - 145 mmol/L    Potassium 3.4 (L) 3.5 - 5.1 mmol/L    Chloride 112 (H) 98 - 111 mmol/L    CO2 18 (L) 22 - 32 mmol/L    Glucose, Bld 149 (H) 70 - 99 mg/dL      Comment: Glucose reference range applies only to samples taken after fasting for at least 8 hours.    BUN 10 6 - 20 mg/dL    Creatinine, Ser 0.61 0.44 - 1.00 mg/dL    Calcium 8.3 (L) 8.9 - 10.3 mg/dL    GFR, Estimated >60 >60 mL/min      Comment: (NOTE) Calculated using the CKD-EPI Creatinine Equation (2021)      Anion gap 9 5 - 15      Comment: Performed at [facility] [address], [place], [place] [address]  Glucose, capillary     Status: Abnormal    Collection Time: 04/19/22  5:20 AM  Result Value Ref Range    Glucose-Capillary 136 (H) 70 - 99 mg/dL      Comment: Glucose reference range applies only to samples taken after fasting for at least 8 hours.        Imaging Results (Last 48 hours)  No results found.     Current scheduled medications  aspirin  81 mg Oral Daily   docusate sodium  100 mg Oral Daily   labetalol  300 mg Oral BID   metoCLOPramide  10 mg Oral TID AC   NIFEdipine  90 mg Oral Daily   potassium chloride  40 mEq Oral BID   prenatal multivitamin  1 tablet Oral Q1200   sodium chloride flush  3 mL Intravenous Q12H       I have reviewed the patient's current medications.   ASSESSMENT: Principal Problem:   DKA, type 1 (HCC)     PLAN: DKA - Continue insulin drip and IVF - CBGs slowly improving now in 130s - Serum ketones improved from 4.16 > 0.59 - Continue K+ replacement - Has continued BMPs Q4h and serum ketones Q8. Both due at 8am.    Pregnancy - Add in robinul for n/v/spitting   Language barrier - Video interpreter used throughout the appointment: Ziad [phone]   Fetal well being - NST bid - normal  growth on 5/23   CHTN - Well controlled on Labetalol 300 bid, and nifed 90 daily     Continue routine antenatal care.     Radene Gunning, MD, [place] for [facility], [place]

## 2022-04-19 NOTE — Progress Notes (Signed)
ADA Standards of Care 2023 Diabetes in Pregnancy Target Glucose Ranges:  Fasting: 70 - 95 mg/dL 1 hr postprandial:  354 - 140mg /dL (from first bite of meal) 2 hr postprandial:  100 - 120 mg/dL (from first bit of meal)    Latest Reference Range & Units 04/18/22 20:34 04/18/22 21:42 04/18/22 22:47 04/18/22 23:58 04/19/22 01:03 04/19/22 02:01 04/19/22 03:01 04/19/22 04:09 04/19/22 05:20  Glucose-Capillary 70 - 99 mg/dL 04/21/22 (H)  IV Insulin Drip Started 178 (H) 174 (H) 175 (H) 131 (H) 147 (H) 176 (H) 132 (H) 136 (H)    Latest Reference Range & Units 04/19/22 06:29 04/19/22 07:28 04/19/22 08:32  Glucose-Capillary 70 - 99 mg/dL 04/21/22 (H) 563 (H) 893 (H)  IV Insulin Drip infusing    Admit with:  DKA in Pregnancy [name] female at [redacted]w[redacted]d weeks gestation  States Dexcom CGM not working  History: Type 1 Diabetes  Home DM Meds: Dexcom G6 CGM       Lantus 25 units AM/ 30 units PM          Novolog 15 units TID with meals       Novolog Correction Factor 1 unit for every 30 mg/dl above Target CBG of [redacted]w[redacted]d mg/dl  Current Orders: IV Insulin Drip    MD- Note Beta-Hydroxybutyric acid level down to 0.07 at 8am.   BMET showed glucose 117/ Anion Gap 7/ CO2 level 19  If Nausea and Vomiting has subsided, may be able to transition back to SQ Insulin this AM  At time of transition, please consider:  1. Start Semglee 25 units Daily + Semglee 30 units QHS Give the 25 units Semglee 2 hours prior to d/c of the IV Insulin Drip  Start the 30 units Semglee tonight at bedtime  2. Start Novolog 0-15 units Q4 hours (per the Glycemic Control Order Set)  3. Once eating PO diet and able to keep food down, will need to Change Novolog Correction/SSI to Fasting and 2-Hour Postpandial and Likely will need to Add Novolog Meal Coverage    Pt told Admitting MD Dexcom CGM not working for 10 days prior to admit Per Chart Review, MD referred MD to RD to assist with Dexcom replacement--Per notes, RD called pt and asked  her to bring Dexcom and all supplies on 04/14/2022--Do not see that pt went to this appt   Endocrinologist: Dr. 04/16/2022 with [facility] Last Seen 03/18/2022 Was instructed to take the following: Dexcom G6 CGM--Monitor CBGs Fasting and 2 hours postprandial Lantus 25 units AM/ 30 units PM Novolog 15 units TID with meals Novolog Correction Factor 1 unit for every 30 mg/dl above Target CBG of 03/20/2022 mg/dl    --Will follow patient during hospitalization--  681 RN, MSN, CDE Diabetes Coordinator Inpatient Glycemic Control Team Team Pager: [phone] (8a-5p)

## 2022-04-20 LAB — GLUCOSE, CAPILLARY
Glucose-Capillary: 100 mg/dL — ABNORMAL HIGH (ref 70–99)
Glucose-Capillary: 101 mg/dL — ABNORMAL HIGH (ref 70–99)
Glucose-Capillary: 101 mg/dL — ABNORMAL HIGH (ref 70–99)
Glucose-Capillary: 103 mg/dL — ABNORMAL HIGH (ref 70–99)
Glucose-Capillary: 106 mg/dL — ABNORMAL HIGH (ref 70–99)
Glucose-Capillary: 112 mg/dL — ABNORMAL HIGH (ref 70–99)
Glucose-Capillary: 113 mg/dL — ABNORMAL HIGH (ref 70–99)
Glucose-Capillary: 116 mg/dL — ABNORMAL HIGH (ref 70–99)
Glucose-Capillary: 118 mg/dL — ABNORMAL HIGH (ref 70–99)
Glucose-Capillary: 122 mg/dL — ABNORMAL HIGH (ref 70–99)
Glucose-Capillary: 123 mg/dL — ABNORMAL HIGH (ref 70–99)
Glucose-Capillary: 136 mg/dL — ABNORMAL HIGH (ref 70–99)
Glucose-Capillary: 138 mg/dL — ABNORMAL HIGH (ref 70–99)
Glucose-Capillary: 77 mg/dL (ref 70–99)
Glucose-Capillary: 86 mg/dL (ref 70–99)
Glucose-Capillary: 87 mg/dL (ref 70–99)
Glucose-Capillary: 90 mg/dL (ref 70–99)
Glucose-Capillary: 98 mg/dL (ref 70–99)

## 2022-04-20 LAB — PHOSPHORUS: Phosphorus: 2.5 mg/dL (ref 2.5–4.6)

## 2022-04-20 LAB — BASIC METABOLIC PANEL
Anion gap: 8 (ref 5–15)
BUN: <5 mg/dL — ABNORMAL LOW (ref 6–20)
CO2: 20 mmol/L — ABNORMAL LOW (ref 22–32)
Calcium: 8.6 mg/dL — ABNORMAL LOW (ref 8.9–10.3)
Chloride: 110 mmol/L (ref 98–111)
Creatinine, Ser: 0.42 mg/dL — ABNORMAL LOW (ref 0.44–1.00)
GFR, Estimated: >60 mL/min (ref >60)
Glucose, Bld: 128 mg/dL — ABNORMAL HIGH (ref 70–99)
Potassium: 3.5 mmol/L (ref 3.5–5.1)
Sodium: 138 mmol/L (ref 135–145)

## 2022-04-20 LAB — MAGNESIUM: Magnesium: 1.5 mg/dL — ABNORMAL LOW (ref 1.7–2.4)

## 2022-04-20 LAB — CBC
HCT: 40.6 % (ref 36.0–46.0)
Hemoglobin: 13.4 g/dL (ref 12.0–15.0)
MCH: 28.5 pg (ref 26.0–34.0)
MCHC: 33 g/dL (ref 30.0–36.0)
MCV: 86.2 fL (ref 80.0–100.0)
Platelets: 229 10*3/uL (ref 150–400)
RBC: 4.71 MIL/uL (ref 3.87–5.11)
RDW: 14.2 % (ref 11.5–15.5)
WBC: 4.5 10*3/uL (ref 4.0–10.5)
nRBC: 0 % (ref 0.0–0.2)

## 2022-04-20 MED ORDER — POTASSIUM CHLORIDE 10 MEQ/100ML IV SOLN
INTRAVENOUS | Status: AC
  Administered 2022-04-20: 10 meq
  Filled 2022-04-20: qty 100

## 2022-04-20 MED ORDER — MAGNESIUM SULFATE 2 GM/50ML IV SOLN
2.0000 g | Freq: Once | INTRAVENOUS | Status: AC
  Administered 2022-04-20: 2 g via INTRAVENOUS
  Filled 2022-04-20: qty 50

## 2022-04-20 MED ORDER — POTASSIUM CHLORIDE 10 MEQ/100ML IV SOLN
INTRAVENOUS | Status: AC
  Filled 2022-04-20: qty 100

## 2022-04-20 MED ORDER — POTASSIUM CHLORIDE 10 MEQ/100ML IV SOLN
10.0000 meq | INTRAVENOUS | Status: AC
  Administered 2022-04-20 (×4): 10 meq via INTRAVENOUS
  Filled 2022-04-20 (×3): qty 100

## 2022-04-20 MED ORDER — LABETALOL HCL 200 MG PO TABS
300.0000 mg | ORAL_TABLET | Freq: Three times a day (TID) | ORAL | Status: DC
Start: 2022-04-20 — End: 2022-04-21
  Administered 2022-04-20 (×3): 300 mg via ORAL
  Filled 2022-04-20 (×3): qty 1

## 2022-04-20 MED ORDER — ALUM & MAG HYDROXIDE-SIMETH 200-200-20 MG/5ML PO SUSP
30.0000 mL | ORAL | Status: DC | PRN
  Administered 2022-04-20 (×2): 30 mL via ORAL
  Filled 2022-04-20 (×2): qty 30

## 2022-04-20 MED ORDER — FAMOTIDINE IN NACL 20-0.9 MG/50ML-% IV SOLN
20.0000 mg | Freq: Two times a day (BID) | INTRAVENOUS | Status: DC
  Administered 2022-04-20 (×2): 20 mg via INTRAVENOUS
  Filled 2022-04-20 (×2): qty 50

## 2022-04-20 NOTE — Progress Notes (Signed)
Kristin Clay is a 36 y.o. [phone] at [redacted]w[redacted]d who is admitted for DKA.  Estimated Date of Delivery: 07/03/22 Fetal presentation is breech as of 5/23.  Length of Stay:  2 Days. Admitted 04/18/2022  Subjective: She is having some ongoing emesis. She is not keeping much down.   Patient reports fetal movement. She reports no uterine contractions, no bleeding and no loss of fluid per vagina.  Vitals:  Blood pressure (!) 143/80, pulse 79, temperature 98 F (36.7 C), temperature source Oral, resp. rate 18, weight 55.3 kg, last menstrual period 09/20/2021, SpO2 100 %, not currently breastfeeding. Physical Examination: CONSTITUTIONAL: Well-developed, well-nourished female in no acute distress.  NEUROLOGIC: Alert and oriented to person, place, and time. No cranial nerve deficit noted. PSYCHIATRIC: Normal mood and affect. Normal behavior. Normal judgment and thought content. CARDIOVASCULAR: Normal heart rate noted, regular rhythm RESPIRATORY: Effort and breath sounds normal, no problems with respiration noted MUSCULOSKELETAL: Normal range of motion. No edema and no tenderness. 2+ distal pulses. ABDOMEN: Soft, nontender, nondistended, gravid. CERVIX:  Not examined  Fetal monitoring: FHR: 150 bpm, Variability: moderate, Accelerations: Present 10x10, Decelerations: Absent  Uterine activity: no contractions   Results for orders placed or performed during the hospital encounter of 04/18/22 (from the past 48 hour(s))  Glucose, capillary     Status: Abnormal   Collection Time: 04/18/22  8:34 PM  Result Value Ref Range   Glucose-Capillary 190 (H) 70 - 99 mg/dL    Comment: Glucose reference range applies only to samples taken after fasting for at least 8 hours.  Glucose, capillary     Status: Abnormal   Collection Time: 04/18/22  9:42 PM  Result Value Ref Range   Glucose-Capillary 178 (H) 70 - 99 mg/dL    Comment: Glucose reference  range applies only to samples taken after fasting for at least 8 hours.  Glucose, capillary     Status: Abnormal   Collection Time: 04/18/22 10:47 PM  Result Value Ref Range   Glucose-Capillary 174 (H) 70 - 99 mg/dL    Comment: Glucose reference range applies only to samples taken after fasting for at least 8 hours.  Basic metabolic panel     Status: Abnormal   Collection Time: 04/18/22 11:55 PM  Result Value Ref Range   Sodium 141 135 - 145 mmol/L   Potassium 3.1 (L) 3.5 - 5.1 mmol/L   Chloride 113 (H) 98 - 111 mmol/L   CO2 18 (L) 22 - 32 mmol/L   Glucose, Bld 173 (H) 70 - 99 mg/dL    Comment: Glucose reference range applies only to samples taken after fasting for at least 8 hours.   BUN 10 6 - 20 mg/dL   Creatinine, Ser 0.63 0.44 - 1.00 mg/dL   Calcium 8.3 (L) 8.9 - 10.3 mg/dL   GFR, Estimated >60 >60 mL/min    Comment: (NOTE) Calculated using the CKD-EPI Creatinine Equation (2021)    Anion gap 10 5 - 15    Comment: Performed at [facility] [address], [place], [place] [phone]  Beta-hydroxybutyric acid     Status: Abnormal   Collection Time: 04/18/22 11:55 PM  Result Value Ref Range   Beta-Hydroxybutyric Acid 0.59 (H) 0.05 - 0.27 mmol/L    Comment: Performed at [facility] [address], [place], [place] [address]  Glucose, capillary     Status: Abnormal   Collection Time: 04/18/22 11:58 PM  Result Value  Ref Range   Glucose-Capillary 175 (H) 70 - 99 mg/dL    Comment: Glucose reference range applies only to samples taken after fasting for at least 8 hours.  Glucose, capillary     Status: Abnormal   Collection Time: 04/19/22  1:03 AM  Result Value Ref Range   Glucose-Capillary 131 (H) 70 - 99 mg/dL    Comment: Glucose reference range applies only to samples taken after fasting for at least 8 hours.  Glucose, capillary     Status: Abnormal   Collection Time: 04/19/22  2:01 AM  Result Value Ref Range   Glucose-Capillary 147 (H) 70 - 99 mg/dL    Comment:  Glucose reference range applies only to samples taken after fasting for at least 8 hours.  Glucose, capillary     Status: Abnormal   Collection Time: 04/19/22  3:01 AM  Result Value Ref Range   Glucose-Capillary 176 (H) 70 - 99 mg/dL    Comment: Glucose reference range applies only to samples taken after fasting for at least 8 hours.  Glucose, capillary     Status: Abnormal   Collection Time: 04/19/22  4:09 AM  Result Value Ref Range   Glucose-Capillary 132 (H) 70 - 99 mg/dL    Comment: Glucose reference range applies only to samples taken after fasting for at least 8 hours.  Basic metabolic panel     Status: Abnormal   Collection Time: 04/19/22  4:36 AM  Result Value Ref Range   Sodium 139 135 - 145 mmol/L   Potassium 3.4 (L) 3.5 - 5.1 mmol/L   Chloride 112 (H) 98 - 111 mmol/L   CO2 18 (L) 22 - 32 mmol/L   Glucose, Bld 149 (H) 70 - 99 mg/dL    Comment: Glucose reference range applies only to samples taken after fasting for at least 8 hours.   BUN 10 6 - 20 mg/dL   Creatinine, Ser 0.61 0.44 - 1.00 mg/dL   Calcium 8.3 (L) 8.9 - 10.3 mg/dL   GFR, Estimated >60 >60 mL/min    Comment: (NOTE) Calculated using the CKD-EPI Creatinine Equation (2021)    Anion gap 9 5 - 15    Comment: Performed at [facility] [address] [place], [place] [address]  Glucose, capillary     Status: Abnormal   Collection Time: 04/19/22  5:20 AM  Result Value Ref Range   Glucose-Capillary 136 (H) 70 - 99 mg/dL    Comment: Glucose reference range applies only to samples taken after fasting for at least 8 hours.  Glucose, capillary     Status: Abnormal   Collection Time: 04/19/22  6:29 AM  Result Value Ref Range   Glucose-Capillary 126 (H) 70 - 99 mg/dL    Comment: Glucose reference range applies only to samples taken after fasting for at least 8 hours.  Glucose, capillary     Status: Abnormal   Collection Time: 04/19/22  7:28 AM  Result Value Ref Range   Glucose-Capillary 122 (H) 70 - 99 mg/dL     Comment: Glucose reference range applies only to samples taken after fasting for at least 8 hours.  Basic metabolic panel     Status: Abnormal   Collection Time: 04/19/22  8:04 AM  Result Value Ref Range   Sodium 137 135 - 145 mmol/L   Potassium 3.4 (L) 3.5 - 5.1 mmol/L   Chloride 111 98 - 111 mmol/L   CO2 19 (L) 22 - 32 mmol/L   Glucose,  Bld 117 (H) 70 - 99 mg/dL    Comment: Glucose reference range applies only to samples taken after fasting for at least 8 hours.   BUN 8 6 - 20 mg/dL   Creatinine, Ser 0.44 0.44 - 1.00 mg/dL   Calcium 8.1 (L) 8.9 - 10.3 mg/dL   GFR, Estimated >60 >60 mL/min    Comment: (NOTE) Calculated using the CKD-EPI Creatinine Equation (2021)    Anion gap 7 5 - 15    Comment: Performed at [facility] [address], [place], [place] [address]  Beta-hydroxybutyric acid     Status: None   Collection Time: 04/19/22  8:04 AM  Result Value Ref Range   Beta-Hydroxybutyric Acid 0.07 0.05 - 0.27 mmol/L    Comment: Performed at [facility] [address] [place], [place] [address]  Glucose, capillary     Status: Abnormal   Collection Time: 04/19/22  8:32 AM  Result Value Ref Range   Glucose-Capillary 113 (H) 70 - 99 mg/dL    Comment: Glucose reference range applies only to samples taken after fasting for at least 8 hours.  Glucose, capillary     Status: None   Collection Time: 04/19/22  9:44 AM  Result Value Ref Range   Glucose-Capillary 98 70 - 99 mg/dL    Comment: Glucose reference range applies only to samples taken after fasting for at least 8 hours.  Glucose, capillary     Status: None   Collection Time: 04/19/22 10:46 AM  Result Value Ref Range   Glucose-Capillary 95 70 - 99 mg/dL    Comment: Glucose reference range applies only to samples taken after fasting for at least 8 hours.  Glucose, capillary     Status: Abnormal   Collection Time: 04/19/22 12:57 PM  Result Value Ref Range   Glucose-Capillary 140 (H) 70 - 99 mg/dL    Comment:  Glucose reference range applies only to samples taken after fasting for at least 8 hours.  Glucose, capillary     Status: Abnormal   Collection Time: 04/19/22  2:06 PM  Result Value Ref Range   Glucose-Capillary 121 (H) 70 - 99 mg/dL    Comment: Glucose reference range applies only to samples taken after fasting for at least 8 hours.  Glucose, capillary     Status: None   Collection Time: 04/19/22  3:01 PM  Result Value Ref Range   Glucose-Capillary 94 70 - 99 mg/dL    Comment: Glucose reference range applies only to samples taken after fasting for at least 8 hours.  Glucose, capillary     Status: Abnormal   Collection Time: 04/19/22  4:00 PM  Result Value Ref Range   Glucose-Capillary 104 (H) 70 - 99 mg/dL    Comment: Glucose reference range applies only to samples taken after fasting for at least 8 hours.  Glucose, capillary     Status: Abnormal   Collection Time: 04/19/22  5:05 PM  Result Value Ref Range   Glucose-Capillary 110 (H) 70 - 99 mg/dL    Comment: Glucose reference range applies only to samples taken after fasting for at least 8 hours.  Glucose, capillary     Status: None   Collection Time: 04/19/22  7:02 PM  Result Value Ref Range   Glucose-Capillary 92 70 - 99 mg/dL    Comment: Glucose reference range applies only to samples taken after fasting for at least 8 hours.  Basic metabolic panel     Status: Abnormal  Collection Time: 04/19/22  7:35 PM  Result Value Ref Range   Sodium 139 135 - 145 mmol/L   Potassium 2.9 (L) 3.5 - 5.1 mmol/L   Chloride 110 98 - 111 mmol/L   CO2 21 (L) 22 - 32 mmol/L   Glucose, Bld 83 70 - 99 mg/dL    Comment: Glucose reference range applies only to samples taken after fasting for at least 8 hours.   BUN <5 (L) 6 - 20 mg/dL   Creatinine, Ser 7.67 (L) 0.44 - 1.00 mg/dL   Calcium 8.5 (L) 8.9 - 10.3 mg/dL   GFR, Estimated >34 >19 mL/min    Comment: (NOTE) Calculated using the CKD-EPI Creatinine Equation (2021)    Anion gap 8 5 - 15     Comment: Performed at [facility], [address], [place], [place] [address]  Glucose, capillary     Status: None   Collection Time: 04/19/22  8:10 PM  Result Value Ref Range   Glucose-Capillary 92 70 - 99 mg/dL    Comment: Glucose reference range applies only to samples taken after fasting for at least 8 hours.  Glucose, capillary     Status: Abnormal   Collection Time: 04/19/22 11:03 PM  Result Value Ref Range   Glucose-Capillary 138 (H) 70 - 99 mg/dL    Comment: Glucose reference range applies only to samples taken after fasting for at least 8 hours.  Glucose, capillary     Status: Abnormal   Collection Time: 04/20/22 12:05 AM  Result Value Ref Range   Glucose-Capillary 123 (H) 70 - 99 mg/dL    Comment: Glucose reference range applies only to samples taken after fasting for at least 8 hours.  Glucose, capillary     Status: Abnormal   Collection Time: 04/20/22  1:25 AM  Result Value Ref Range   Glucose-Capillary 101 (H) 70 - 99 mg/dL    Comment: Glucose reference range applies only to samples taken after fasting for at least 8 hours.  Glucose, capillary     Status: Abnormal   Collection Time: 04/20/22  2:08 AM  Result Value Ref Range   Glucose-Capillary 101 (H) 70 - 99 mg/dL    Comment: Glucose reference range applies only to samples taken after fasting for at least 8 hours.  Glucose, capillary     Status: Abnormal   Collection Time: 04/20/22  3:08 AM  Result Value Ref Range   Glucose-Capillary 122 (H) 70 - 99 mg/dL    Comment: Glucose reference range applies only to samples taken after fasting for at least 8 hours.  Glucose, capillary     Status: None   Collection Time: 04/20/22  4:08 AM  Result Value Ref Range   Glucose-Capillary 86 70 - 99 mg/dL    Comment: Glucose reference range applies only to samples taken after fasting for at least 8 hours.  Glucose, capillary     Status: Abnormal   Collection Time: 04/20/22  5:05 AM  Result Value Ref Range   Glucose-Capillary  138 (H) 70 - 99 mg/dL    Comment: Glucose reference range applies only to samples taken after fasting for at least 8 hours.  Basic metabolic panel     Status: Abnormal   Collection Time: 04/20/22  5:30 AM  Result Value Ref Range   Sodium 138 135 - 145 mmol/L   Potassium 3.5 3.5 - 5.1 mmol/L    Comment: DELTA CHECK NOTED   Chloride 110 98 - 111 mmol/L  CO2 20 (L) 22 - 32 mmol/L   Glucose, Bld 128 (H) 70 - 99 mg/dL    Comment: Glucose reference range applies only to samples taken after fasting for at least 8 hours.   BUN <5 (L) 6 - 20 mg/dL   Creatinine, Ser 0.42 (L) 0.44 - 1.00 mg/dL   Calcium 8.6 (L) 8.9 - 10.3 mg/dL   GFR, Estimated >60 >60 mL/min    Comment: (NOTE) Calculated using the CKD-EPI Creatinine Equation (2021)    Anion gap 8 5 - 15    Comment: Performed at [facility] [address] [place], [place] [address]  Phosphorus     Status: None   Collection Time: 04/20/22  5:30 AM  Result Value Ref Range   Phosphorus 2.5 2.5 - 4.6 mg/dL    Comment: Performed at [facility] [address], [place], [place] [address]  Magnesium     Status: Abnormal   Collection Time: 04/20/22  5:30 AM  Result Value Ref Range   Magnesium 1.5 (L) 1.7 - 2.4 mg/dL    Comment: Performed at [facility] [address] [place], [place] [address]  Glucose, capillary     Status: Abnormal   Collection Time: 04/20/22  6:27 AM  Result Value Ref Range   Glucose-Capillary 116 (H) 70 - 99 mg/dL    Comment: Glucose reference range applies only to samples taken after fasting for at least 8 hours.  Glucose, capillary     Status: Abnormal   Collection Time: 04/20/22  7:35 AM  Result Value Ref Range   Glucose-Capillary 118 (H) 70 - 99 mg/dL    Comment: Glucose reference range applies only to samples taken after fasting for at least 8 hours.  Glucose, capillary     Status: Abnormal   Collection Time: 04/20/22  8:34 AM  Result Value Ref Range   Glucose-Capillary 136 (H) 70 - 99  mg/dL    Comment: Glucose reference range applies only to samples taken after fasting for at least 8 hours.  CBC     Status: None   Collection Time: 04/20/22  8:35 AM  Result Value Ref Range   WBC 4.5 4.0 - 10.5 K/uL   RBC 4.71 3.87 - 5.11 MIL/uL   Hemoglobin 13.4 12.0 - 15.0 g/dL   HCT 40.6 36.0 - 46.0 %   MCV 86.2 80.0 - 100.0 fL   MCH 28.5 26.0 - 34.0 pg   MCHC 33.0 30.0 - 36.0 g/dL   RDW 14.2 11.5 - 15.5 %   Platelets 229 150 - 400 K/uL   nRBC 0.0 0.0 - 0.2 %    Comment: Performed at [facility], [place] [address], [place], [place] [address]  Glucose, capillary     Status: None   Collection Time: 04/20/22  9:36 AM  Result Value Ref Range   Glucose-Capillary 98 70 - 99 mg/dL    Comment: Glucose reference range applies only to samples taken after fasting for at least 8 hours.  Glucose, capillary     Status: Abnormal   Collection Time: 04/20/22 10:38 AM  Result Value Ref Range   Glucose-Capillary 103 (H) 70 - 99 mg/dL    Comment: Glucose reference range applies only to samples taken after fasting for at least 8 hours.  Glucose, capillary     Status: Abnormal   Collection Time: 04/20/22 11:43 AM  Result Value Ref Range   Glucose-Capillary 113 (H) 70 - 99 mg/dL  Comment: Glucose reference range applies only to samples taken after fasting for at least 8 hours.  Glucose, capillary     Status: None   Collection Time: 04/20/22  1:44 PM  Result Value Ref Range   Glucose-Capillary 90 70 - 99 mg/dL    Comment: Glucose reference range applies only to samples taken after fasting for at least 8 hours.  Glucose, capillary     Status: Abnormal   Collection Time: 04/20/22  3:46 PM  Result Value Ref Range   Glucose-Capillary 112 (H) 70 - 99 mg/dL    Comment: Glucose reference range applies only to samples taken after fasting for at least 8 hours.  Glucose, capillary     Status: Abnormal   Collection Time: 04/20/22  5:53 PM  Result Value Ref Range   Glucose-Capillary 106 (H) 70 - 99  mg/dL    Comment: Glucose reference range applies only to samples taken after fasting for at least 8 hours.    No results found.  Current scheduled medications  aspirin  81 mg Oral Daily   docusate sodium  100 mg Oral Daily   labetalol  300 mg Oral TID   metoCLOPramide (REGLAN) injection  5 mg Intravenous Q6H   NIFEdipine  90 mg Oral Daily   potassium chloride  40 mEq Oral BID   prenatal multivitamin  1 tablet Oral Q1200   scopolamine  1 patch Transdermal Q72H   sodium chloride flush  3 mL Intravenous Q12H    I have reviewed the patient's current medications.  ASSESSMENT: Principal Problem:   DKA, type 1 (HCC)   PLAN: DKA - Continue insulin drip and IVF - CBGs 92-138 - Serum ketones normal - Continue K+ replacement - Has continued BMP daily - Continue to work on N/V to be able to transition to regular diet.   Pregnancy - Routine PNC - 28w labs done - Tdap done 5/22  Language barrier - Video interpreter used throughout the appointment: [place] video interpreter  Fetal well being - NST bid - normal growth on 5/23  CHTN - Well controlled on Labetalol 300 tid, and nifed 90 daily   Continue routine antenatal care.   Radene Gunning, MD, [place] for [facility], [place]

## 2022-04-21 DIAGNOSIS — E1010 Type 1 diabetes mellitus with ketoacidosis without coma: Secondary | ICD-10-CM | POA: Diagnosis not present

## 2022-04-21 DIAGNOSIS — O10919 Unspecified pre-existing hypertension complicating pregnancy, unspecified trimester: Secondary | ICD-10-CM

## 2022-04-21 DIAGNOSIS — Z3A29 29 weeks gestation of pregnancy: Secondary | ICD-10-CM | POA: Diagnosis not present

## 2022-04-21 DIAGNOSIS — O219 Vomiting of pregnancy, unspecified: Secondary | ICD-10-CM | POA: Diagnosis not present

## 2022-04-21 LAB — BASIC METABOLIC PANEL
Anion gap: 5 (ref 5–15)
BUN: <5 mg/dL — ABNORMAL LOW (ref 6–20)
CO2: 21 mmol/L — ABNORMAL LOW (ref 22–32)
Calcium: 7.9 mg/dL — ABNORMAL LOW (ref 8.9–10.3)
Chloride: 111 mmol/L (ref 98–111)
Creatinine, Ser: 0.41 mg/dL — ABNORMAL LOW (ref 0.44–1.00)
GFR, Estimated: >60 mL/min (ref >60)
Glucose, Bld: 94 mg/dL (ref 70–99)
Potassium: 3.3 mmol/L — ABNORMAL LOW (ref 3.5–5.1)
Sodium: 137 mmol/L (ref 135–145)

## 2022-04-21 LAB — GLUCOSE, CAPILLARY
Glucose-Capillary: 102 mg/dL — ABNORMAL HIGH (ref 70–99)
Glucose-Capillary: 102 mg/dL — ABNORMAL HIGH (ref 70–99)
Glucose-Capillary: 103 mg/dL — ABNORMAL HIGH (ref 70–99)
Glucose-Capillary: 103 mg/dL — ABNORMAL HIGH (ref 70–99)
Glucose-Capillary: 107 mg/dL — ABNORMAL HIGH (ref 70–99)
Glucose-Capillary: 122 mg/dL — ABNORMAL HIGH (ref 70–99)
Glucose-Capillary: 124 mg/dL — ABNORMAL HIGH (ref 70–99)
Glucose-Capillary: 191 mg/dL — ABNORMAL HIGH (ref 70–99)
Glucose-Capillary: 61 mg/dL — ABNORMAL LOW (ref 70–99)
Glucose-Capillary: 83 mg/dL (ref 70–99)
Glucose-Capillary: 88 mg/dL (ref 70–99)
Glucose-Capillary: 89 mg/dL (ref 70–99)
Glucose-Capillary: 97 mg/dL (ref 70–99)

## 2022-04-21 MED ORDER — INSULIN GLARGINE-YFGN 100 UNIT/ML ~~LOC~~ SOLN
25.0000 [IU] | Freq: Every day | SUBCUTANEOUS | Status: DC
  Administered 2022-04-21 – 2022-04-22 (×2): 25 [IU] via SUBCUTANEOUS
  Filled 2022-04-21 (×2): qty 0.25

## 2022-04-21 MED ORDER — LABETALOL HCL 200 MG PO TABS
400.0000 mg | ORAL_TABLET | Freq: Three times a day (TID) | ORAL | Status: DC
Start: 2022-04-21 — End: 2022-04-23
  Administered 2022-04-21 – 2022-04-23 (×7): 400 mg via ORAL
  Filled 2022-04-21 (×7): qty 2

## 2022-04-21 MED ORDER — POTASSIUM CHLORIDE 10 MEQ/100ML IV SOLN
10.0000 meq | INTRAVENOUS | Status: AC
  Administered 2022-04-21 (×2): 10 meq via INTRAVENOUS
  Filled 2022-04-21 (×2): qty 100

## 2022-04-21 MED ORDER — INSULIN ASPART 100 UNIT/ML IJ SOLN
0.0000 [IU] | INTRAMUSCULAR | Status: DC
  Administered 2022-04-21: 3 [IU] via SUBCUTANEOUS
  Administered 2022-04-22: 2 [IU] via SUBCUTANEOUS

## 2022-04-21 MED ORDER — INSULIN GLARGINE-YFGN 100 UNIT/ML ~~LOC~~ SOLN
25.0000 [IU] | Freq: Two times a day (BID) | SUBCUTANEOUS | Status: DC
  Filled 2022-04-21 (×2): qty 0.25

## 2022-04-21 MED ORDER — SODIUM CHLORIDE 0.9 % IV SOLN: INTRAVENOUS | Status: DC

## 2022-04-21 MED ORDER — FAMOTIDINE 20 MG PO TABS
20.0000 mg | ORAL_TABLET | Freq: Two times a day (BID) | ORAL | Status: DC
  Administered 2022-04-21 – 2022-04-23 (×5): 20 mg via ORAL
  Filled 2022-04-21 (×5): qty 1

## 2022-04-21 NOTE — Progress Notes (Signed)
Hypoglycemic Event  CBG: 61 @ 1752  Treatment: 4oz apple juice  Symptoms: patient called out and said, "I feel like my sugar is low"  Follow-up CBG: Time: 1817 CBG Result: 88  Possible Reasons for Event: Recent transition off of endotool and to basal insulin   [employer]

## 2022-04-21 NOTE — Progress Notes (Signed)
Inpatient Diabetes Program Recommendations  Diabetes Treatment Program Recommendations  ADA Standards of Care Diabetes in Pregnancy Target Glucose Ranges:  Fasting: 70 - 95 mg/dL 1 hr postprandial: Less than 140mg /dL (from first bite of meal) 2 hr postprandial: Less than 120 mg/dL (from first bite of meal)    Lab Results  Component Value Date   GLUCAP 97 04/21/2022   HGBA1C 8.4 (H) 02/26/2022    Review of Glycemic Control  Latest Reference Range & Units 04/21/22 04:50 04/21/22 06:54  Glucose-Capillary 70 - 99 mg/dL 04/23/22 (H) 97  (H): Data is abnormally high DKA in Pregnancy [name] female at [redacted]w[redacted]d weeks gestation  States Dexcom CGM not working   History: Type 1 Diabetes   Home DM Meds: Dexcom G6 CGM                             Lantus 25 units AM/ 30 units PM                             Novolog 15 units TID with meals                             Novolog Correction Factor 1 unit for every 30 mg/dl above Target CBG of [redacted]w[redacted]d mg/dl   Current Orders: IV Insulin Drip      When ready to transition, consider:   1. Start Semglee 25 units two hours prior to discontinuation, then QD to follow.   2. Start Novolog 0-14 units Q4 hours (under Diabetes treatment in Pregnancy order set)   3. Once eating PO diet and able to keep food down, will need to Change Novolog Correction/SSI to Fasting and 2-Hour Postpandial and Likely will need to Add Novolog Meal Coverage       Endocrinologist: Dr. [id number] with [facility] Last Seen 03/18/2022 Was instructed to take the following: Dexcom G6 CGM--Monitor CBGs Fasting and 2 hours postprandial Lantus 25 units AM/ 30 units PM Novolog 15 units TID with meals Novolog Correction Factor 1 unit for every 30 mg/dl above Target CBG of 03/20/2022 mg/dl    Thanks, 045, MSN, RNC-[facility] Diabetes Coordinator [phone] (8a-5p)

## 2022-04-21 NOTE — Progress Notes (Addendum)
FACULTY PRACTICE ANTEPARTUM(COMPREHENSIVE) NOTE  Kristin Clay is a 36 y.o. [phone] with Estimated Date of Delivery: 07/03/22   By  midtrimester [redacted]w[redacted]d  who is admitted for DKA.    Fetal presentation is breech. Length of Stay:  3  Days  Date of admission:04/18/2022  Subjective: Pt notes episode of emesis early this am, but feeling better currently.  Per pt it seems as though the emesis has slowed down.  NPO overnight.    Patient reports the fetal movement as active. Patient reports uterine contraction  activity as none. Patient reports  vaginal bleeding as none. Patient describes fluid per vagina as None.  Vitals:  Blood pressure 136/73, pulse 73, temperature 98.2 F (36.8 C), temperature source Oral, resp. rate 16, weight 55.3 kg, last menstrual period 09/20/2021, SpO2 100 %, not currently breastfeeding. Vitals:   04/21/22 0830 04/21/22 0848 04/21/22 0921 04/21/22 0931  BP: (!) 165/90 (!) 163/92 (!) 142/73 136/73  Pulse:  77 67 73  Resp:      Temp:      TempSrc:      SpO2:    100%  Weight:       Physical Examination:  General appearance - well appearing, and in no distress NEURO- alert and oriented x 3 PSYCH: normal mood and behaivor Chest - CTAB Heart - normal rate and regular rhythm Abdomen - gravid, soft and non-tender Extremities - no edema, no calf tenderness bilaterally Skin - warm and dry   Fetal Monitoring:  Baseline: 145 bpm, Variability: moderate, Accelerations: +10x10 accels, and Decelerations: Absent    Appropriate for current gestational age  Labs:  Results for orders placed or performed during the hospital encounter of 04/18/22 (from the past 24 hour(s))  Glucose, capillary   Collection Time: 04/20/22 11:43 AM  Result Value Ref Range   Glucose-Capillary 113 (H) 70 - 99 mg/dL  Glucose, capillary   Collection Time: 04/20/22  1:44 PM  Result Value Ref Range   Glucose-Capillary 90 70 - 99 mg/dL  Glucose, capillary   Collection Time: 04/20/22   3:46 PM  Result Value Ref Range   Glucose-Capillary 112 (H) 70 - 99 mg/dL  Glucose, capillary   Collection Time: 04/20/22  5:53 PM  Result Value Ref Range   Glucose-Capillary 106 (H) 70 - 99 mg/dL  Glucose, capillary   Collection Time: 04/20/22  8:23 PM  Result Value Ref Range   Glucose-Capillary 100 (H) 70 - 99 mg/dL  Glucose, capillary   Collection Time: 04/20/22 10:26 PM  Result Value Ref Range   Glucose-Capillary 77 70 - 99 mg/dL  Glucose, capillary   Collection Time: 04/20/22 11:33 PM  Result Value Ref Range   Glucose-Capillary 87 70 - 99 mg/dL  Glucose, capillary   Collection Time: 04/21/22 12:37 AM  Result Value Ref Range   Glucose-Capillary 124 (H) 70 - 99 mg/dL  Glucose, capillary   Collection Time: 04/21/22  1:38 AM  Result Value Ref Range   Glucose-Capillary 122 (H) 70 - 99 mg/dL  Glucose, capillary   Collection Time: 04/21/22  2:48 AM  Result Value Ref Range   Glucose-Capillary 103 (H) 70 - 99 mg/dL  Glucose, capillary   Collection Time: 04/21/22  3:50 AM  Result Value Ref Range   Glucose-Capillary 102 (H) 70 - 99 mg/dL  Basic metabolic panel   Collection Time: 04/21/22  4:23 AM  Result Value Ref Range   Sodium 137 135 - 145 mmol/L   Potassium 3.3 (L) 3.5 - 5.1 mmol/L  Chloride 111 98 - 111 mmol/L   CO2 21 (L) 22 - 32 mmol/L   Glucose, Bld 94 70 - 99 mg/dL   BUN <5 (L) 6 - 20 mg/dL   Creatinine, Ser 5.88 (L) 0.44 - 1.00 mg/dL   Calcium 7.9 (L) 8.9 - 10.3 mg/dL   GFR, Estimated >50 >27 mL/min   Anion gap 5 5 - 15  Glucose, capillary   Collection Time: 04/21/22  4:50 AM  Result Value Ref Range   Glucose-Capillary 103 (H) 70 - 99 mg/dL  Glucose, capillary   Collection Time: 04/21/22  6:54 AM  Result Value Ref Range   Glucose-Capillary 97 70 - 99 mg/dL  Glucose, capillary   Collection Time: 04/21/22  8:51 AM  Result Value Ref Range   Glucose-Capillary 102 (H) 70 - 99 mg/dL  Glucose, capillary   Collection Time: 04/21/22 10:51 AM  Result Value Ref  Range   Glucose-Capillary 83 70 - 99 mg/dL    Imaging Studies:    No results found.   Medications:  Scheduled  aspirin  81 mg Oral Daily   docusate sodium  100 mg Oral Daily   famotidine  20 mg Oral BID   insulin aspart  0-14 Units Subcutaneous Q4H   insulin glargine-yfgn  25 Units Subcutaneous BID   labetalol  400 mg Oral TID   metoCLOPramide (REGLAN) injection  5 mg Intravenous Q6H   NIFEdipine  90 mg Oral Daily   potassium chloride  40 mEq Oral BID   prenatal multivitamin  1 tablet Oral Q1200   scopolamine  1 patch Transdermal Q72H   sodium chloride flush  3 mL Intravenous Q12H   I have reviewed the patient's current medications.  ASSESSMENT: [id number] [redacted]w[redacted]d Estimated Date of Delivery: 07/03/22   PLAN: 1) DKA -significant improvement of CBGs -appreciate DM coordinator recommendations -D/C insulin tool, start semglee 25u daily -q4hr CBS and sliding scale -plan to adjust meal coverage insulin pending need -continue with oral potassium -continue BMP daily -currently on IV anti-emetics will transition to po if/when tolerating diet  2) Chronic HTN -pt asymptomatic, labs stable -increased to Labetalol 400mg  tid, continue Nifedepine 90mg  daily  3) Fetal well-being -Reactive NST for current gestational age -last growth 5/23 -continue NST bid  DISP: Plan to advance to full liquids this am and transition to subQ insulin as above   Amada Hallisey 04/21/2022,11:00 AM

## 2022-04-21 NOTE — Progress Notes (Incomplete)
FACULTY PRACTICE ANTEPARTUM(COMPREHENSIVE) NOTE  Kristin Clay is a 36 y.o. [phone] with Estimated Date of Delivery: 07/03/22   By  {Ob dating:14516} [redacted]w[redacted]d  who is admitted for {Reason for Admission:21615}.    Fetal presentation is {desc; fetal presentation:14558}. Length of Stay:  3  Days  Date of admission:04/18/2022  Subjective: *** Patient reports the fetal movement as {Fetal Movement:20184}. Patient reports uterine contraction  activity as {obgyn contractions reg/irreg:312982}. Patient reports  vaginal bleeding as {desc; vaginal bleeding:14141:s}. Patient describes fluid per vagina as {desc; amniotic fluid:31309}.  Vitals:  Blood pressure 136/73, pulse 73, temperature 98.2 F (36.8 C), temperature source Oral, resp. rate 16, weight 55.3 kg, last menstrual period 09/20/2021, SpO2 100 %, not currently breastfeeding. Vitals:   04/21/22 0830 04/21/22 0848 04/21/22 0921 04/21/22 0931  BP: (!) 165/90 (!) 163/92 (!) 142/73 136/73  Pulse:  77 67 73  Resp:      Temp:      TempSrc:      SpO2:    100%  Weight:       Physical Examination:  {female adult master:310786} Fundal Height:  {findings; fundal height:14540} Pelvic Exam:  {:315900} Cervical Exam: {Pe_cervix_labor:31295} and found to be {:31290}/ {Ob effacement:14523}/{station:14562} and fetal presentation is {desc; fetal presentation:14558}. Extremities: {Exam; extremity:5109} with DTRs {EXAM; NEURO DTR'S:18606} Membranes:{:314523}  Fetal Monitoring:  {findings; monitor fetal heart monitor:31527}   {obgyn fetal nst findings:312980}  Labs:  Results for orders placed or performed during the hospital encounter of 04/18/22 (from the past 24 hour(s))  Glucose, capillary   Collection Time: 04/20/22 11:43 AM  Result Value Ref Range   Glucose-Capillary 113 (H) 70 - 99 mg/dL  Glucose, capillary   Collection Time: 04/20/22  1:44 PM  Result Value Ref Range   Glucose-Capillary 90 70 - 99 mg/dL  Glucose, capillary    Collection Time: 04/20/22  3:46 PM  Result Value Ref Range   Glucose-Capillary 112 (H) 70 - 99 mg/dL  Glucose, capillary   Collection Time: 04/20/22  5:53 PM  Result Value Ref Range   Glucose-Capillary 106 (H) 70 - 99 mg/dL  Glucose, capillary   Collection Time: 04/20/22  8:23 PM  Result Value Ref Range   Glucose-Capillary 100 (H) 70 - 99 mg/dL  Glucose, capillary   Collection Time: 04/20/22 10:26 PM  Result Value Ref Range   Glucose-Capillary 77 70 - 99 mg/dL  Glucose, capillary   Collection Time: 04/20/22 11:33 PM  Result Value Ref Range   Glucose-Capillary 87 70 - 99 mg/dL  Glucose, capillary   Collection Time: 04/21/22 12:37 AM  Result Value Ref Range   Glucose-Capillary 124 (H) 70 - 99 mg/dL  Glucose, capillary   Collection Time: 04/21/22  1:38 AM  Result Value Ref Range   Glucose-Capillary 122 (H) 70 - 99 mg/dL  Glucose, capillary   Collection Time: 04/21/22  2:48 AM  Result Value Ref Range   Glucose-Capillary 103 (H) 70 - 99 mg/dL  Glucose, capillary   Collection Time: 04/21/22  3:50 AM  Result Value Ref Range   Glucose-Capillary 102 (H) 70 - 99 mg/dL  Basic metabolic panel   Collection Time: 04/21/22  4:23 AM  Result Value Ref Range   Sodium 137 135 - 145 mmol/L   Potassium 3.3 (L) 3.5 - 5.1 mmol/L   Chloride 111 98 - 111 mmol/L   CO2 21 (L) 22 - 32 mmol/L   Glucose, Bld 94 70 - 99 mg/dL   BUN <5 (L) 6 - 20 mg/dL  Creatinine, Ser 0.41 (L) 0.44 - 1.00 mg/dL   Calcium 7.9 (L) 8.9 - 10.3 mg/dL   GFR, Estimated >85 >63 mL/min   Anion gap 5 5 - 15  Glucose, capillary   Collection Time: 04/21/22  4:50 AM  Result Value Ref Range   Glucose-Capillary 103 (H) 70 - 99 mg/dL  Glucose, capillary   Collection Time: 04/21/22  6:54 AM  Result Value Ref Range   Glucose-Capillary 97 70 - 99 mg/dL  Glucose, capillary   Collection Time: 04/21/22  8:51 AM  Result Value Ref Range   Glucose-Capillary 102 (H) 70 - 99 mg/dL  Glucose, capillary   Collection Time: 04/21/22  10:51 AM  Result Value Ref Range   Glucose-Capillary 83 70 - 99 mg/dL    Imaging Studies:    No results found.   Medications:  Scheduled  aspirin  81 mg Oral Daily   docusate sodium  100 mg Oral Daily   famotidine  20 mg Oral BID   insulin aspart  0-14 Units Subcutaneous Q4H   insulin glargine-yfgn  25 Units Subcutaneous BID   labetalol  400 mg Oral TID   metoCLOPramide (REGLAN) injection  5 mg Intravenous Q6H   NIFEdipine  90 mg Oral Daily   potassium chloride  40 mEq Oral BID   prenatal multivitamin  1 tablet Oral Q1200   scopolamine  1 patch Transdermal Q72H   sodium chloride flush  3 mL Intravenous Q12H   {medication reviewed/display:3041432}  ASSESSMENT: [id number] [redacted]w[redacted]d Estimated Date of Delivery: 07/03/22  Patient Active Problem List   Diagnosis Date Noted   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) 03/18/2022   Type 1 diabetes mellitus with diabetic polyneuropathy (HCC) 03/18/2022   Type 1 diabetes mellitus with hyperglycemia (HCC) 03/18/2022   Hypomagnesemia 03/01/2022   Red Chart Rounds 02/26/2022   Hypokalemia 01/06/2022   Alpha thalassemia silent carrier 01/05/2022   DKA, type 1 (HCC) 01/05/2022   Atypical chest pain 01/04/2022   Blurry vision 01/04/2022   DM type 1 causing eye disease (HCC) 01/04/2022   Shortness of breath 01/04/2022   Proteinuria affecting pregnancy 12/16/2021   Chronic hypertension affecting pregnancy 12/15/2021   Short interval between pregnancies affecting pregnancy, antepartum 12/15/2021   History of severe preeclampsia, prior pregnancy, currently pregnant 12/15/2021   Previous preterm delivery at 33w6 due to severe preeclampsia, antepartum 12/15/2021   History of forceps delivery in prior pregnancy, currently pregnant 12/15/2021   Type 1 diabetes mellitus affecting pregnancy in third trimester, antepartum 12/11/2021   Supervision of high risk pregnancy, antepartum 11/27/2021   Rh negative, antepartum  11/27/2021   Poor compliance 08/04/2021   Hypertension 08/04/2021   Language barrier 01/11/2020   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

## 2022-04-22 DIAGNOSIS — O219 Vomiting of pregnancy, unspecified: Secondary | ICD-10-CM | POA: Diagnosis not present

## 2022-04-22 DIAGNOSIS — O10919 Unspecified pre-existing hypertension complicating pregnancy, unspecified trimester: Secondary | ICD-10-CM | POA: Diagnosis not present

## 2022-04-22 DIAGNOSIS — Z3A29 29 weeks gestation of pregnancy: Secondary | ICD-10-CM | POA: Diagnosis not present

## 2022-04-22 DIAGNOSIS — E1010 Type 1 diabetes mellitus with ketoacidosis without coma: Secondary | ICD-10-CM | POA: Diagnosis not present

## 2022-04-22 LAB — HEPATIC FUNCTION PANEL
ALT: 9 U/L (ref 0–44)
AST: 13 U/L — ABNORMAL LOW (ref 15–41)
Albumin: 2.3 g/dL — ABNORMAL LOW (ref 3.5–5.0)
Alkaline Phosphatase: 62 U/L (ref 38–126)
Bilirubin, Direct: <0.1 mg/dL (ref 0.0–0.2)
Total Bilirubin: 0.6 mg/dL (ref 0.3–1.2)
Total Protein: 5.6 g/dL — ABNORMAL LOW (ref 6.5–8.1)

## 2022-04-22 LAB — GLUCOSE, CAPILLARY
Glucose-Capillary: 65 mg/dL — ABNORMAL LOW (ref 70–99)
Glucose-Capillary: 98 mg/dL (ref 70–99)
Glucose-Capillary: 70 mg/dL (ref 70–99)
Glucose-Capillary: 127 mg/dL — ABNORMAL HIGH (ref 70–99)
Glucose-Capillary: 102 mg/dL — ABNORMAL HIGH (ref 70–99)
Glucose-Capillary: 95 mg/dL (ref 70–99)
Glucose-Capillary: 146 mg/dL — ABNORMAL HIGH (ref 70–99)

## 2022-04-22 LAB — BASIC METABOLIC PANEL
Anion gap: 7 (ref 5–15)
BUN: <5 mg/dL — ABNORMAL LOW (ref 6–20)
CO2: 20 mmol/L — ABNORMAL LOW (ref 22–32)
Calcium: 8.5 mg/dL — ABNORMAL LOW (ref 8.9–10.3)
Chloride: 107 mmol/L (ref 98–111)
Creatinine, Ser: 0.46 mg/dL (ref 0.44–1.00)
GFR, Estimated: >60 mL/min (ref >60)
Glucose, Bld: 77 mg/dL (ref 70–99)
Potassium: 3.8 mmol/L (ref 3.5–5.1)
Sodium: 134 mmol/L — ABNORMAL LOW (ref 135–145)

## 2022-04-22 MED ORDER — INSULIN ASPART 100 UNIT/ML IJ SOLN
0.0000 [IU] | Freq: Three times a day (TID) | INTRAMUSCULAR | Status: DC
  Administered 2022-04-22: 1 [IU] via SUBCUTANEOUS
  Administered 2022-04-22: 2 [IU] via SUBCUTANEOUS
  Administered 2022-04-23: 1 [IU] via SUBCUTANEOUS

## 2022-04-22 MED ORDER — INSULIN ASPART 100 UNIT/ML IJ SOLN: 0.0000 [IU] | INTRAMUSCULAR | Status: DC

## 2022-04-22 MED ORDER — METOCLOPRAMIDE HCL 10 MG PO TABS
5.0000 mg | ORAL_TABLET | Freq: Four times a day (QID) | ORAL | Status: DC
  Administered 2022-04-22 – 2022-04-23 (×5): 5 mg via ORAL
  Filled 2022-04-22 (×5): qty 1

## 2022-04-22 MED ORDER — INSULIN GLARGINE-YFGN 100 UNIT/ML ~~LOC~~ SOLN
20.0000 [IU] | Freq: Every day | SUBCUTANEOUS | Status: DC
Start: 2022-04-23 — End: 2022-04-23
  Administered 2022-04-23: 20 [IU] via SUBCUTANEOUS
  Filled 2022-04-22: qty 0.2

## 2022-04-22 MED ORDER — INSULIN GLARGINE-YFGN 100 UNIT/ML ~~LOC~~ SOLN: 20.0000 [IU] | Freq: Every day | SUBCUTANEOUS | Status: DC

## 2022-04-22 MED ORDER — ONDANSETRON 4 MG PO TBDP
4.0000 mg | ORAL_TABLET | Freq: Three times a day (TID) | ORAL | Status: DC | PRN
Start: 2022-04-22 — End: 2022-04-23

## 2022-04-22 NOTE — Progress Notes (Signed)
Hypoglycemic Event   CBG: 65 @ 0200   Treatment: pudding cup   Symptoms: patient asymptomatic   Follow-up CBG: Time: 0233   CBG Result: 98   Possible Reasons for Event: Recent transition off of endotool and to basal insulin     Tor Netters

## 2022-04-22 NOTE — Progress Notes (Addendum)
Inpatient Diabetes Program Recommendations  Diabetes Treatment Program Recommendations  ADA Standards of Care Diabetes in Pregnancy Target Glucose Ranges:  Fasting: 70 - 95 mg/dL 1 hr postprandial: Less than 140mg /dL (from first bite of meal) 2 hr postprandial: Less than 120 mg/dL (from first bite of meal)      Lab Results  Component Value Date   GLUCAP 127 (H) 04/22/2022   HGBA1C 8.4 (H) 02/26/2022    Review of Glycemic Control  Latest Reference Range & Units 04/22/22 02:00 04/22/22 02:33 04/22/22 06:11 04/22/22 12:01  Glucose-Capillary 70 - 99 mg/dL 65 (L) 98 70 04/24/22 (H)  (L): Data is abnormally low DKA in Pregnancy [name] female at [redacted]w[redacted]d weeks gestation  States Dexcom CGM not working   History: Type 1 Diabetes   Home DM Meds: Dexcom G6 CGM                             Lantus 25 units AM/ 30 units PM                             Novolog 15 units TID with meals                             Novolog 0-9 units TID for postprandial correction  Inpatient Diabetes Program Recommendations:    Noted hypoglycemia of 65 mg/dL following Semglee administration and transition off IV insulin. Consider slightly reducing Semglee to 20 units QHS. Discussed plan of care with Dr [redacted]w[redacted]d to include: order recommendations, thoughts on discharge plan and Freestyle libre. Orders received.   Spoke with patient patient at length regarding outpatient diabetes management. Patient is followed by Dr Charlotta Newton, outpatient endocrinology. Was in the process of starting education for Omnipod. At last appointment on 4/23 Dr 5/23 increased coverage with meals to Novolog 15 units TID and added Novolog 0-9 units for post prandial coverage. Patient states, "I felt this was too much. I started having lows, so I did not take anymore and do not want to go back." She denies reaching out to practice for further direction.  Has been using Dexcom, however, was no longer working for the last few weeks so blood sugars  have been more variable. Patient reports since Dexcom was no working, she was using glucose meter FSBG and 2hr pp.  Reviewed risks and benefits of Freestyle libre. Patient is interested in device until follow up. Freestyle libre applied to patient's right upper arm. Discussed application on phone, how to scan, apply additional sensors, obtain readings and when to call MD. Encouraged patient to ask for additional Dexcom at next endocrinology appointment on June 5th.   Patient is unable to count carbohydrates effectively. Multiple questions regarding intake of fruit. Reviewed basic carbohydrate counting, need for meal coverage, limiting excessive intake of CHO with snacks, importance of protein with snacks, and the need to follow up with 06-11-1979, RD.   Patient was given a sliding scale to use for post prandial coverage. This is in addition to Novolog 15 units TID. Patient able to effectively state how to perform skill and successfully administer correct dose based on physician order. Feel the need to significantly reduce meal coverage based on current intake while inpatient.   Reviewed sick day rules with patient and the importance of continuing to take basal even if experiencing nausea/vomiting. Also discussed sliding scale as an  option. Patient in agreement.   Reviewed patient's current A1c of 8.4%. Explained what a A1c is and what it measures and accuracy in pregnancy. Also reviewed goal A1c with patient, importance of good glucose control @ home, and blood sugar goals. Reviewed patho of DM in pregnancy, risks of DKA/acidosis, interventions for hypoglycemia, hyperinsulinemia in the neonate, risks to infant, hormonal fluctuations at pregnancy continues, and long implications of poor control. This A1C does NOT reflect adequate control as patient reports daily fluctuations from 40-400 mg/dL. Need for consistency and possible permissive hyperglycemia as a means to reduce likelihood of DKA, patient acceptance  with plan and minimizing frequent hypoglycemia.   Will plan to see again on 6/1 to review plan for discharge. Following.  Thanks, Lujean Rave, MSN, RNC-OB Diabetes Coordinator [phone] (8a-5p)

## 2022-04-22 NOTE — Progress Notes (Signed)
Patient received dinner at 1900. Will check 2000 CBG, 2hrr pp at 2100.

## 2022-04-23 ENCOUNTER — Other Ambulatory Visit (HOSPITAL_COMMUNITY): Payer: Self-pay

## 2022-04-23 DIAGNOSIS — Z3A29 29 weeks gestation of pregnancy: Secondary | ICD-10-CM | POA: Diagnosis not present

## 2022-04-23 DIAGNOSIS — O219 Vomiting of pregnancy, unspecified: Secondary | ICD-10-CM | POA: Diagnosis not present

## 2022-04-23 DIAGNOSIS — E1010 Type 1 diabetes mellitus with ketoacidosis without coma: Secondary | ICD-10-CM | POA: Diagnosis not present

## 2022-04-23 DIAGNOSIS — O10919 Unspecified pre-existing hypertension complicating pregnancy, unspecified trimester: Secondary | ICD-10-CM | POA: Diagnosis not present

## 2022-04-23 LAB — CBC
HCT: 38.8 % (ref 36.0–46.0)
Hemoglobin: 13.5 g/dL (ref 12.0–15.0)
MCH: 29.7 pg (ref 26.0–34.0)
MCHC: 34.8 g/dL (ref 30.0–36.0)
MCV: 85.5 fL (ref 80.0–100.0)
Platelets: 211 10*3/uL (ref 150–400)
RBC: 4.54 MIL/uL (ref 3.87–5.11)
RDW: 13.8 % (ref 11.5–15.5)
WBC: 3 10*3/uL — ABNORMAL LOW (ref 4.0–10.5)
nRBC: 0 % (ref 0.0–0.2)

## 2022-04-23 LAB — COMPREHENSIVE METABOLIC PANEL
ALT: 9 U/L (ref 0–44)
AST: 10 U/L — ABNORMAL LOW (ref 15–41)
Albumin: 2.3 g/dL — ABNORMAL LOW (ref 3.5–5.0)
Alkaline Phosphatase: 62 U/L (ref 38–126)
Anion gap: 7 (ref 5–15)
BUN: 5 mg/dL — ABNORMAL LOW (ref 6–20)
CO2: 22 mmol/L (ref 22–32)
Calcium: 8.4 mg/dL — ABNORMAL LOW (ref 8.9–10.3)
Chloride: 107 mmol/L (ref 98–111)
Creatinine, Ser: 0.49 mg/dL (ref 0.44–1.00)
GFR, Estimated: >60 mL/min (ref >60)
Glucose, Bld: 110 mg/dL — ABNORMAL HIGH (ref 70–99)
Potassium: 3.9 mmol/L (ref 3.5–5.1)
Sodium: 136 mmol/L (ref 135–145)
Total Bilirubin: 0.3 mg/dL (ref 0.3–1.2)
Total Protein: 5.6 g/dL — ABNORMAL LOW (ref 6.5–8.1)

## 2022-04-23 LAB — GLUCOSE, CAPILLARY
Glucose-Capillary: 107 mg/dL — ABNORMAL HIGH (ref 70–99)
Glucose-Capillary: 91 mg/dL (ref 70–99)

## 2022-04-23 MED ORDER — LABETALOL HCL 200 MG PO TABS
400.0000 mg | ORAL_TABLET | Freq: Three times a day (TID) | ORAL | 3 refills | Status: DC
  Filled 2022-04-23: qty 180, 30d supply, fill #0

## 2022-04-23 MED ORDER — NOVOLOG FLEXPEN 100 UNIT/ML ~~LOC~~ SOPN
PEN_INJECTOR | SUBCUTANEOUS | 11 refills | Status: DC
  Filled 2022-04-23: qty 6, 50d supply, fill #0

## 2022-04-23 MED ORDER — INSULIN PEN NEEDLE 32G X 4 MM MISC
0 refills | Status: DC
  Filled 2022-04-23: qty 100, 25d supply, fill #0

## 2022-04-23 MED ORDER — INSULIN GLARGINE 100 UNIT/ML SOLOSTAR PEN
PEN_INJECTOR | SUBCUTANEOUS | 6 refills | Status: DC
  Filled 2022-04-23: qty 15, 68d supply, fill #0

## 2022-04-23 NOTE — Plan of Care (Signed)
  Problem: Clinical Measurements: Goal: Ability to maintain clinical measurements within normal limits will improve Outcome: Adequate for Discharge Goal: Will remain free from infection Outcome: Adequate for Discharge Goal: Diagnostic test results will improve Outcome: Adequate for Discharge Goal: Respiratory complications will improve Outcome: Adequate for Discharge Goal: Cardiovascular complication will be avoided Outcome: Adequate for Discharge   Problem: Clinical Measurements: Goal: Complications related to the disease process, condition or treatment will be avoided or minimized Outcome: Adequate for Discharge

## 2022-04-23 NOTE — Progress Notes (Signed)
Inpatient Diabetes Program Recommendations  Diabetes Treatment Program Recommendations  ADA Standards of Care Diabetes in Pregnancy Target Glucose Ranges:  Fasting: 70 - 95 mg/dL 1 hr postprandial: Less than 140mg /dL (from first bite of meal) 2 hr postprandial: Less than 120 mg/dL (from first bite of meal)    Lab Results  Component Value Date   GLUCAP 107 (H) 04/23/2022   HGBA1C 8.4 (H) 02/26/2022    Review of Glycemic Control  Latest Reference Range & Units 04/22/22 18:32 04/22/22 20:59 04/23/22 06:01  Glucose-Capillary 70 - 99 mg/dL 95 06/23/22 (H) 242 (H)  (H): Data is abnormally high 353 female at [redacted]w[redacted]d weeks gestation  States Dexcom CGM not working   History: Type 1 Diabetes   Home DM Meds: Dexcom G6 CGM                             Lantus 25 units AM/ 30 units PM                             Novolog 15 units TID with meals                             Novolog 0-9 units TID for postprandial correction Current: Semglee 20 units QD, Novolog 0-14 units TID & HS   Inpatient Diabetes Program Recommendations:    Consider:  -Increasing Semglee to 22 units QD -Anticipate need for meal coverage if intake improving: Novolog 3 units TID (assuming patient consuming >50% of meals)  In preparation for discharge: -Lantus 22 units QD -Novolog 3 units TID (assuming patient consuming >50% of meals) - Novolog 0-6 units TID for post prandial correction -Novolog 0-6 units Q4H when unable to tolerate intake - Patient to follow up with Dr [redacted]w[redacted]d on June 7th for appointment and Omnipod training (has all supplies already).  Spoke with patient again to reinforce concepts discussed yesterday. Patient able to answer questions appropriately.  Left message for Dr 11-02-1969 to discuss changes in insulin requirements since previous appointment given plan for Omnipod application and patient reports of hypoglycemia and rebound hyperglycemia followed by DKA. Awaiting call back.   Thanks, Lonzo Cloud, MSN, RNC-OB Diabetes Coordinator [phone] (8a-5p)

## 2022-04-23 NOTE — Progress Notes (Signed)
Pt discharged with husband  meds given from pharmacy   no questions  written instructions reviewed  and copy given

## 2022-04-23 NOTE — Progress Notes (Signed)
[place]) NOTE  Kristin Clay is a 36 y.o. [phone] with Estimated Date of Delivery: 07/03/22   By  midtrimester ultrasound [redacted]w[redacted]d  who is admitted for DKA with nausea/vomiting.    Fetal presentation is breech. Length of Stay:  5  Days  Date of admission:04/18/2022  Subjective: Pt resting comfortably, no acute complaints overnight.  Feeling ready to go home.  Denies nausea/vomiting.  Tolerating general diet.   Patient reports the fetal movement as active. Patient reports uterine contraction  activity as none. Patient reports  vaginal bleeding as none. Patient describes fluid per vagina as None.  Vitals:  Blood pressure (!) 152/90, pulse 69, temperature 98.2 F (36.8 C), temperature source Oral, resp. rate 18, weight 55.3 kg, last menstrual period 09/20/2021, SpO2 100 %, not currently breastfeeding. Vitals:   04/22/22 1936 04/23/22 0607 04/23/22 0755 04/23/22 0758  BP: (!) 108/59 (!) 158/96 (!) 161/91 (!) 152/90  Pulse: 83 78 69 69  Resp: 18 18 18    Temp: 98.2 F (36.8 C) 98.4 F (36.9 C) 98.2 F (36.8 C)   TempSrc: Oral Oral Oral   SpO2: 100% 100% 100%   Weight:       Physical Examination:  General appearance - alert, well appearing, and in no distress Mental status - normal mood, behavior, speech, dress, motor activity, and thought processes Chest - CTAB Heart - normal rate and regular rhythm Abdomen - gravid, soft and non-tender Extremities - no edema, no calf tenderness bilaterally Skin - warm and dry   Fetal Monitoring:  Baseline: 140 bpm, Variability: moderate, Accelerations: +10x10 accels, and Decelerations: Absent    Appropriate for current gestational age  Labs:  Results for orders placed or performed during the hospital encounter of 04/18/22 (from the past 24 hour(s))  Hepatic function panel   Collection Time: 04/22/22 11:32 AM  Result Value Ref Range   Total Protein 5.6 (L) 6.5 - 8.1 g/dL   Albumin 2.3 (L) 3.5 - 5.0  g/dL   AST 13 (L) 15 - 41 U/L   ALT 9 0 - 44 U/L   Alkaline Phosphatase 62 38 - 126 U/L   Total Bilirubin 0.6 0.3 - 1.2 mg/dL   Bilirubin, Direct <0.1 0.0 - 0.2 mg/dL   Indirect Bilirubin NOT CALCULATED 0.3 - 0.9 mg/dL  Glucose, capillary   Collection Time: 04/22/22 12:01 PM  Result Value Ref Range   Glucose-Capillary 127 (H) 70 - 99 mg/dL  Glucose, capillary   Collection Time: 04/22/22  4:33 PM  Result Value Ref Range   Glucose-Capillary 102 (H) 70 - 99 mg/dL  Glucose, capillary   Collection Time: 04/22/22  6:32 PM  Result Value Ref Range   Glucose-Capillary 95 70 - 99 mg/dL  Glucose, capillary   Collection Time: 04/22/22  8:59 PM  Result Value Ref Range   Glucose-Capillary 146 (H) 70 - 99 mg/dL  Glucose, capillary   Collection Time: 04/23/22  6:01 AM  Result Value Ref Range   Glucose-Capillary 107 (H) 70 - 99 mg/dL    Imaging Studies:    No results found.   Medications:  Scheduled  aspirin  81 mg Oral Daily   docusate sodium  100 mg Oral Daily   famotidine  20 mg Oral BID   insulin aspart  0-14 Units Subcutaneous TID PC   insulin glargine-yfgn  20 Units Subcutaneous Daily   labetalol  400 mg Oral TID   metoCLOPramide  5 mg Oral Q6H   NIFEdipine  90 mg  Oral Daily   prenatal multivitamin  1 tablet Oral Q1200   scopolamine  1 patch Transdermal Q72H   sodium chloride flush  3 mL Intravenous Q12H   I have reviewed the patient's current medications.  ASSESSMENT: [id number] 106w6d Estimated Date of Delivery: 07/03/22  -DKA with nausea/vomiting -Type 1 DM -Chronic HTN  PLAN: 1) Type 1 DM -sugars much improved -currently on semglee 20units daily -will likely restart meal coverage today for new home regimen -appreciate recommendations from DM coordinator  2) Chronic HTN -AM BPs elevated; however overnight 100/50s -pt currently asymptomatic, repeat labs ordered -suspect elevated BP due to the timing of her current medication. Plan to change timing of Procardia to  early am 7-8am instead of 10am -medications given this am and will recheck BP, if within normal range will not increase medication  3) Nausea/vomiting -now resolved -continue regimen of reglan, scop patch and zofran prn  4) Fetal well being -Appropriate NST for current gestational age -will plan for outpatient serial growth as scheduled and antepartum testing    DISP: If BP improves will plan for discharge home with close outpatient follow up  Kristin Clay 04/23/2022,9:37 AM

## 2022-04-23 NOTE — Discharge Summary (Signed)
Physician Discharge Summary  Patient ID: Kristin Clay MRN: [id number] DOB/AGE: 1986-09-10 36 y.o.  Admit date: 04/18/2022 Discharge date: 04/23/2022  Admission Diagnoses:  DKA Nausea/vomiting Intrauterine pregnancy Chronic HTN  Discharge Diagnoses:  Same as above  Discharged Condition: stable  Hospital Course: 36yo O1B5102@ 4w1dadmitted for DKA with nausea/vomiting.  Initially she was managed with IV fluids and insulin drip.  She was treated with IV anti-emetics.  Labs and symptoms improved and was transitioned to subQ insulin and oral anti-emetics.  Due to elevated blood pressure, her medication was adjusted as needed.  On HD #5 she was noted to be tolerating a carb modified diet with improvement of her sugars.  Baby remained stable throughout her hospital stay.  Current BP medication include Procardia XL 971mdaily and Labetalol 40012mid.  She was discharge home with plans for close outpatient follow up.  Consults: None  Significant Diagnostic Studies: labs:  Results for orders placed or performed during the hospital encounter of 04/18/22 (from the past 24 hour(s))  Glucose, capillary     Status: Abnormal   Collection Time: 04/22/22  4:33 PM  Result Value Ref Range   Glucose-Capillary 102 (H) 70 - 99 mg/dL  Glucose, capillary     Status: None   Collection Time: 04/22/22  6:32 PM  Result Value Ref Range   Glucose-Capillary 95 70 - 99 mg/dL  Glucose, capillary     Status: Abnormal   Collection Time: 04/22/22  8:59 PM  Result Value Ref Range   Glucose-Capillary 146 (H) 70 - 99 mg/dL  Glucose, capillary     Status: Abnormal   Collection Time: 04/23/22  6:01 AM  Result Value Ref Range   Glucose-Capillary 107 (H) 70 - 99 mg/dL  CBC     Status: Abnormal   Collection Time: 04/23/22  9:37 AM  Result Value Ref Range   WBC 3.0 (L) 4.0 - 10.5 K/uL   RBC 4.54 3.87 - 5.11 MIL/uL   Hemoglobin 13.5 12.0 - 15.0 g/dL   HCT 38.8 36.0 - 46.0 %   MCV 85.5 80.0 - 100.0 fL    MCH 29.7 26.0 - 34.0 pg   MCHC 34.8 30.0 - 36.0 g/dL   RDW 13.8 11.5 - 15.5 %   Platelets 211 150 - 400 K/uL   nRBC 0.0 0.0 - 0.2 %  Comprehensive metabolic panel     Status: Abnormal   Collection Time: 04/23/22  9:37 AM  Result Value Ref Range   Sodium 136 135 - 145 mmol/L   Potassium 3.9 3.5 - 5.1 mmol/L   Chloride 107 98 - 111 mmol/L   CO2 22 22 - 32 mmol/L   Glucose, Bld 110 (H) 70 - 99 mg/dL   BUN 5 (L) 6 - 20 mg/dL   Creatinine, Ser 0.49 0.44 - 1.00 mg/dL   Calcium 8.4 (L) 8.9 - 10.3 mg/dL   Total Protein 5.6 (L) 6.5 - 8.1 g/dL   Albumin 2.3 (L) 3.5 - 5.0 g/dL   AST 10 (L) 15 - 41 U/L   ALT 9 0 - 44 U/L   Alkaline Phosphatase 62 38 - 126 U/L   Total Bilirubin 0.3 0.3 - 1.2 mg/dL   GFR, Estimated >60 >60 mL/min   Anion gap 7 5 - 15  Glucose, capillary     Status: None   Collection Time: 04/23/22 10:27 AM  Result Value Ref Range   Glucose-Capillary 91 70 - 99 mg/dL     Treatments: IV hydration,  insulin and IV anti-emetics  Discharge Exam: Blood pressure 120/70, pulse 78, temperature 98.2 F (36.8 C), temperature source Oral, resp. rate 18, weight 55.3 kg, last menstrual period 09/20/2021, SpO2 100 %, not currently breastfeeding. General appearance - alert, well appearing, and in no distress Mental status - normal mood, behavior, speech, dress, motor activity, and thought processes Chest - CTAB Heart - normal rate and regular rhythm Abdomen - gravid, soft and non-tender Extremities - no edema, no calf tenderness bilaterally Skin - warm and dry    Disposition: Discharge disposition: 01-Home or Self Care        Allergies as of 04/23/2022       Reactions   Pork-derived Products         Medication List     STOP taking these medications    insulin aspart 100 UNIT/ML injection Commonly known as: novoLOG Replaced by: NovoLOG FlexPen 100 UNIT/ML FlexPen   prochlorperazine 10 MG tablet Commonly known as: COMPAZINE   promethazine 25 MG  suppository Commonly known as: PHENERGAN       TAKE these medications    Accu-Chek Guide test strip Generic drug: glucose blood Use as instructed; check blood glucose 4 times daily   Accu-Chek Guide w/Device Kit use as directed 4 (four) times daily.   Accu-Chek Softclix Lancets lancets Use as instructed; check blood glucose 4 times daily   acetaminophen 500 MG tablet Commonly known as: TYLENOL Take 1,000 mg by mouth every 6 (six) hours as needed.   aspirin 81 MG chewable tablet Commonly known as: Aspirin Low Dose CHEW 1 TABLET BY MOUTH DAILY.   Dexcom G6 Transmitter Misc 1 Device by Does not apply route every 3 (three) months.   insulin glargine 100 UNIT/ML Solostar Pen Commonly known as: LANTUS Inject 22 units subcutaneously nightly. What changed: additional instructions   Insulin Pen Needle 32G X 4 MM Misc 1 Device by Does not apply route in the morning, at noon, in the evening, and at bedtime.   labetalol 200 MG tablet Commonly known as: NORMODYNE Take 2 tablets (400 mg total) by mouth 3 (three) times daily. What changed:  medication strength how much to take when to take this   metoCLOPramide 10 MG tablet Commonly known as: Reglan Take 1 tablet (10 mg total) by mouth 3 (three) times daily before meals.   NIFEdipine 90 MG 24 hr tablet Commonly known as: PROCARDIA XL/NIFEDICAL-XL Take 1 tablet (90 mg total) by mouth daily.   NovoLOG FlexPen 100 UNIT/ML FlexPen Generic drug: insulin aspart Inject 3 units subcutaneously with meals. Replaces: insulin aspart 100 UNIT/ML injection   Omnipod 5 G6 Intro (Gen 5) Kit 1 Device by Does not apply route every 3 (three) days.   Omnipod 5 G6 Pod (Gen 5) Misc 1 Device by Does not apply route every 3 (three) days.   ondansetron 4 MG disintegrating tablet Commonly known as: ZOFRAN-ODT Take 1 tablet (4 mg total) by mouth every 6 (six) hours as needed for nausea.   pantoprazole 20 MG tablet Commonly known as:  Protonix Take 2 tablets (40 mg total) by mouth daily.   potassium chloride SA 20 MEQ tablet Commonly known as: KLOR-CON M Take 2 tablets (40 mEq total) by mouth daily for 3 days.   Prenatal Vitamin 27-0.8 MG Tabs Take 1 tablet by mouth daily.        Follow-up [facility] for [facility]. Call in 1 week(s).   Specialty:  Obstetrics and Gynecology Why: Follow up in one week Contact information: [place] [phone]                Signed: Annalee Genta 04/23/2022, 4:05 PM

## 2022-04-23 NOTE — Discharge Instructions (Signed)
Instructions for Insulin  Lantus 22 units every morning- take every day even with nauseated or vomiting.  Humalog 3 units with every meal Humalog sliding scale two hours after meals USE NEW SCALE  IF  -Nausea/Vomiting- Humalog sliding scale every four hours until you are able to keep food down and continue Lantus every day -Blood sugar < 70 mg/dL more than one time a week call Dr Lonzo Cloud for appointment - Blood sugar >180's mg/dL call Dr Lonzo Cloud for appointment  Take as prescribed and communicate concerns with Dr Lonzo Cloud

## 2022-04-24 ENCOUNTER — Encounter: Payer: Self-pay | Admitting: Cardiology

## 2022-04-24 ENCOUNTER — Ambulatory Visit (INDEPENDENT_AMBULATORY_CARE_PROVIDER_SITE_OTHER): Payer: Medicaid Other | Admitting: Cardiology

## 2022-04-24 VITALS — BP 122/86 | HR 74 | Ht 62.0 in | Wt 119.0 lb

## 2022-04-24 DIAGNOSIS — E1065 Type 1 diabetes mellitus with hyperglycemia: Secondary | ICD-10-CM | POA: Diagnosis not present

## 2022-04-24 DIAGNOSIS — O0990 Supervision of high risk pregnancy, unspecified, unspecified trimester: Secondary | ICD-10-CM | POA: Diagnosis not present

## 2022-04-24 DIAGNOSIS — O10919 Unspecified pre-existing hypertension complicating pregnancy, unspecified trimester: Secondary | ICD-10-CM | POA: Diagnosis not present

## 2022-04-24 NOTE — Progress Notes (Unsigned)
Cardio-Obstetrics Clinic  Follow Up Note   Date:  04/24/2022   ID:  Kristin Clay, DOB [birth year], MRN [id number]  PCP:  Dorna Mai, MD   [facility] Providers Cardiologist:  Berniece Salines, DO  Electrophysiologist:  None   { Click to update primary MD,subspecialty MD or APP then REFRESH:1}     Referring MD: Dorna Mai, MD   Chief Complaint:   History of Present Illness:    Kristin Clay is a 36 y.o. female [[name]] who returns for follow up of chronic hypertension and Type 1 diabetes mellitus.   Medical history includes type 1 diabetes which was diagnosed at the age of 78 in her home country in [place] she has been on insulin since, hypertension was previously on losartan hydrochlorothiazide but has been transitioned to nifedipine since she became pregnant, history of severe preeclampsia was referred to the cardio obstetrics clinic for cardiovascular care during pregnancy and assessment of cardiovascular long-term risk.   I saw the patient on January 02, 2022 at that time we talked about her cardiovascular risk her blood pressure was within target no changes were made.  I also started the patient on aspirin 81 mg daily.  I saw the patient on 03/27/2022 at that time she her blood pressure was at target. She was on preeclampsia prophylaxis  Since I saw the patient she has been to the ED for elevated BP. She is doing much better now. Her visit was facilitated by a video interpreter.   Prior CV Studies Reviewed: The following studies were reviewed today:   Past Medical History:  Diagnosis Date   Chronic hypertension    Complication of anesthesia    with hand surgery, local anes did not work, tried twice- had to put her to sleep   Diabetic retinopathy ([place])    DKA (diabetic ketoacidosis) ([place]) 10/2020   Epidural hematoma ([place]) 07/04/2021   Obstetric vaginal laceration with type 3c third degree perineal laceration 06/29/2021   Pyelonephritis  10/2020   Rh negative status during pregnancy 05/01/2021   s/p Rhogam 05/13/21   Type I diabetes mellitus ([place])    dx 39yr ago   UTI (urinary tract infection)     Past Surgical History:  Procedure Laterality Date   female circumcision Bilateral    clitorectomy as well   FOOT SURGERY  2018   HAND SURGERY  22633  { Click here to update PMH, PSH, OB Hx then refresh note  :1}   OB History     Gravida  3   Para  1   Term  0   Preterm  1   AB  1   Living  1      SAB  1   IAB  0   Ectopic  0   Multiple  0   Live Births  1           { Click here to update OB Charting then refresh note  :1}    Current Medications: Current Meds  Medication Sig   Accu-Chek Softclix Lancets lancets Use as instructed; check blood glucose 4 times daily   acetaminophen (TYLENOL) 500 MG tablet Take 1,000 mg by mouth every 6 (six) hours as needed.   aspirin (ASPIRIN LOW DOSE) 81 MG chewable tablet CHEW 1 TABLET BY MOUTH DAILY.   Blood Glucose Monitoring Suppl (ACCU-CHEK GUIDE) w/Device KIT use as directed 4 (four) times daily.   Continuous Blood Gluc Transmit (DEXCOM G6 TRANSMITTER) MISC 1  Device by Does not apply route every 3 (three) months.   glucose blood (ACCU-CHEK GUIDE) test strip Use as instructed; check blood glucose 4 times daily   insulin aspart (NOVOLOG FLEXPEN) 100 UNIT/ML FlexPen Inject 3 units subcutaneously with meals.   insulin glargine (LANTUS) 100 UNIT/ML Solostar Pen Inject 22 units subcutaneously nightly.   Insulin Pen Needle 32G X 4 MM MISC 1 Device by Does not apply route in the morning, at noon, in the evening, and at bedtime.   labetalol (NORMODYNE) 200 MG tablet Take 2 tablets (400 mg total) by mouth 3 (three) times daily.   metoCLOPramide (REGLAN) 10 MG tablet Take 1 tablet (10 mg total) by mouth 3 (three) times daily before meals.   NIFEdipine (PROCARDIA XL/NIFEDICAL-XL) 90 MG 24 hr tablet Take 1 tablet (90 mg total) by mouth daily.   ondansetron  (ZOFRAN-ODT) 4 MG disintegrating tablet Take 1 tablet (4 mg total) by mouth every 6 (six) hours as needed for nausea.   pantoprazole (PROTONIX) 20 MG tablet Take 2 tablets (40 mg total) by mouth daily.   Prenatal Vit-Fe Fumarate-FA (PRENATAL VITAMIN) 27-0.8 MG TABS Take 1 tablet by mouth daily.     Allergies:   Pork-derived products   Social History   Socioeconomic History   Marital status: Married    Spouse name: Not on file   Number of children: Not on file   Years of education: Not on file   Highest education level: Not on file  Occupational History   Not on file  Tobacco Use   Smoking status: Never   Smokeless tobacco: Never  Vaping Use   Vaping Use: Never used  Substance and Sexual Activity   Alcohol use: Never   Drug use: Never   Sexual activity: Yes    Birth control/protection: None  Other Topics Concern   Not on file  Social History Narrative   ** Merged History Encounter **       ** Merged History Encounter **       Social Determinants of Health   Financial Resource Strain: High Risk   Difficulty of Paying Living Expenses: Very hard  Food Insecurity: Landscape architect Present   Worried About Charity fundraiser in the Last Year: Sometimes true   Arboriculturist in the Last Year: Sometimes true  Transportation Needs: Public librarian (Medical): Yes   Lack of Transportation (Non-Medical): Yes  Physical Activity: Not on file  Stress: Not on file  Social Connections: Not on file  { Click here to update SDOH then refresh :1}    Family History  Problem Relation Age of Onset   Hyperlipidemia Mother    Hypertension Mother    Kidney disease Father    Alzheimer's disease Father    { Click here to update FH then refresh note    :1}   ROS:   Please see the history of present illness.    *** All other systems reviewed and are negative.   Labs/EKG Reviewed:    EKG:   EKG is *** ordered today.  The ekg ordered today  demonstrates ***  Recent Labs: 12/15/2021: TSH 0.770 04/20/2022: Magnesium 1.5 04/23/2022: ALT 9; BUN 5; Creatinine, Ser 0.49; Hemoglobin 13.5; Platelets 211; Potassium 3.9; Sodium 136   Recent Lipid Panel Lab Results  Component Value Date/Time   CHOL 179 10/29/2019 02:43 AM   TRIG 77 10/29/2019 02:43 AM   HDL 35 (L) 10/29/2019 02:43 AM  CHOLHDL 5.1 10/29/2019 02:43 AM   LDLCALC 129 (H) 10/29/2019 02:43 AM    Physical Exam:    VS:  BP 122/86   Pulse 74   Ht '5\' 2"'  (1.575 m)   Wt 119 lb (54 kg)   LMP 09/20/2021   SpO2 98%   BMI 21.77 kg/m     Wt Readings from Last 3 Encounters:  04/24/22 119 lb (54 kg)  04/19/22 122 lb (55.3 kg)  04/13/22 123 lb 1.6 oz (55.8 kg)     GEN:  Well nourished, well developed in no acute distress HEENT: Normal NECK: No JVD; No carotid bruits LYMPHATICS: No lymphadenopathy CARDIAC: RRR, no murmurs, rubs, gallops RESPIRATORY:  Clear to auscultation without rales, wheezing or rhonchi  ABDOMEN: Soft, non-tender, non-distended MUSCULOSKELETAL:  No edema; No deformity  SKIN: Warm and dry NEUROLOGIC:  Alert and oriented x 3 PSYCHIATRIC:  Normal affect    Risk Assessment/Risk Calculators:   { Click to calculate CARPREG II - THEN refresh note :1}  CARPREG II Risk Prediction Index Score:  1.  The patient's risk for a primary cardiac event is 5%. { Click to caclulate Mod WHO Class of CV Risk - THEN refresh note :1}     { Click for [id number] Score - THEN Refresh Note    :[id number]}      ASSESSMENT & PLAN:     She is doing well from a CV standpoint  BP at target  Continue prophylaxis for preeclampsia DM treatment per the [facility] team  Delivery mode still being discussed.    Patient Instructions  Medication Instructions:  Your physician recommends that you continue on your current medications as directed. Please refer to the Current Medication list given to you today.   Take blood pressure daily. If greater than 150/90 please call the  office. [phone].   *If you need a refill on your cardiac medications before your next appointment, please call your pharmacy*   Lab Work: None If you have labs (blood work) drawn today and your tests are completely normal, you will receive your results only by: [place] (if you have MyChart) OR A paper copy in the mail If you have any lab test that is abnormal or we need to change your treatment, we will call you to review the results.   Testing/Procedures: None   Follow-Up: At [facility], you and your health needs are our priority.  As part of our continuing mission to provide you with exceptional heart care, we have created designated Provider Care Teams.  These Care Teams include your primary Cardiologist (physician) and Advanced Practice Providers (APPs -  Physician Assistants and Nurse Practitioners) who all work together to provide you with the care you need, when you need it.  We recommend signing up for the patient portal called "MyChart".  Sign up information is provided on this After Visit Summary.  MyChart is used to connect with patients for Virtual Visits (Telemedicine).  Patients are able to view lab/test results, encounter notes, upcoming appointments, etc.  Non-urgent messages can be sent to your provider as well.   To learn more about what you can do with MyChart, go to [email or web address].    Your next appointment:   12 week(s)  The format for your next appointment:   In Person  Provider:   Berniece Salines, DO    Other Instructions   Important Information About Sugar         Dispo:  Return in about 12 weeks (around  07/17/2022).   Medication Adjustments/Labs and Tests Ordered: Current medicines are reviewed at length with the patient today.  Concerns regarding medicines are outlined above.  Tests Ordered: No orders of the defined types were placed in this encounter.  Medication Changes: No orders of the defined types were placed in  this encounter.

## 2022-04-24 NOTE — Progress Notes (Signed)
[facility] Note  04/27/2022     CHIEF COMPLAINT Patient presents for Retina Follow Up   HISTORY OF PRESENT ILLNESS: Kristin Clay is a 36 y.o. female who presents to the clinic today for:   HPI     Retina Follow Up   Patient presents with  Diabetic Retinopathy.  In both eyes.  Severity is moderate.  Duration of 3 weeks.  Since onset it is gradually worsening.  I, the attending physician,  performed the HPI with the patient and updated documentation appropriately.        Comments   Pt here for 3 wk ret f/u. Pt states VA seems worse OU since previous visit.       Last edited by Bernarda Caffey, MD on 04/29/2022  5:19 PM.    Pt feels like her vision is worse, especially when she is looking at her phone, pt was hospitalized twice since she was here last  Referring physician: Gevena Cotton MD [id number] [place], [place] [address]  HISTORICAL INFORMATION:   Selected notes from the MEDICAL RECORD NUMBER Referred by Dr. Frederico Hamman for diabetic retinal evaluation LEE:  Ocular Hx- PMH-pregnancy, DM    CURRENT MEDICATIONS: No current outpatient medications on file. (Ophthalmic Drugs)   No current facility-administered medications for this visit. (Ophthalmic Drugs)   Current Outpatient Medications (Other)  Medication Sig   Accu-Chek Softclix Lancets lancets Use as instructed; check blood glucose 4 times daily   acetaminophen (TYLENOL) 500 MG tablet Take 1,000 mg by mouth every 6 (six) hours as needed.   aspirin (ASPIRIN LOW DOSE) 81 MG chewable tablet CHEW 1 TABLET BY MOUTH DAILY.   Blood Glucose Monitoring Suppl (ACCU-CHEK GUIDE) w/Device KIT use as directed 4 (four) times daily.   glucose blood (ACCU-CHEK GUIDE) test strip Use as instructed; check blood glucose 4 times daily   insulin aspart (NOVOLOG FLEXPEN) 100 UNIT/ML FlexPen Inject 3 units subcutaneously with meals.   insulin glargine (LANTUS) 100 UNIT/ML Solostar Pen  Inject 22 units subcutaneously nightly. (Patient taking differently: Inject 25 in the morning 20 units subcutaneously nightly.)   Insulin Pen Needle 32G X 4 MM MISC 1 Device by Does not apply route in the morning, at noon, in the evening, and at bedtime.   labetalol (NORMODYNE) 200 MG tablet Take 2 tablets (400 mg total) by mouth 3 (three) times daily.   metoCLOPramide (REGLAN) 10 MG tablet Take 1 tablet (10 mg total) by mouth 3 (three) times daily before meals.   NIFEdipine (PROCARDIA XL/NIFEDICAL-XL) 90 MG 24 hr tablet Take 1 tablet (90 mg total) by mouth daily.   ondansetron (ZOFRAN-ODT) 4 MG disintegrating tablet Take 1 tablet (4 mg total) by mouth every 6 (six) hours as needed for nausea.   pantoprazole (PROTONIX) 20 MG tablet Take 2 tablets (40 mg total) by mouth daily.   Prenatal Vit-Fe Fumarate-FA (PRENATAL VITAMIN) 27-0.8 MG TABS Take 1 tablet by mouth daily.   Continuous Blood Gluc Sensor (DEXCOM G6 SENSOR) MISC 1 Device by Does not apply route as directed. Every 10 days   Continuous Blood Gluc Transmit (DEXCOM G6 TRANSMITTER) MISC 1 Device by Does not apply route every 3 (three) months.   Insulin Disposable Pump (OMNIPOD 5 G6 INTRO, GEN 5,) KIT 1 Device by Does not apply route every 3 (three) days.   Insulin Disposable Pump (OMNIPOD 5 G6 POD, GEN 5,) MISC 1 Device by Does not apply route every 3 (three) days.  No current facility-administered medications for this visit. (Other)   REVIEW OF SYSTEMS: ROS   Positive for: Endocrine, Eyes Negative for: Constitutional, Gastrointestinal, Neurological, Skin, Genitourinary, Musculoskeletal, HENT, Cardiovascular, Respiratory, Psychiatric, Allergic/Imm, Heme/Lymph Last edited by Kingsley Spittle, COT on 04/27/2022  9:47 AM.     ALLERGIES Allergies  Allergen Reactions   Pork-Derived Products    PAST MEDICAL HISTORY Past Medical History:  Diagnosis Date   Chronic hypertension    Complication of anesthesia    with hand surgery, local  anes did not work, tried twice- had to put her to sleep   Diabetic retinopathy ([place])    DKA (diabetic ketoacidosis) ([place]) 10/2020   Epidural hematoma ([place]) 07/04/2021   Obstetric vaginal laceration with type 3c third degree perineal laceration 06/29/2021   Pyelonephritis 10/2020   Rh negative status during pregnancy 05/01/2021   s/p Rhogam 05/13/21   Type I diabetes mellitus ([place])    dx 67yr ago   UTI (urinary tract infection)    Past Surgical History:  Procedure Laterality Date   female circumcision Bilateral    clitorectomy as well   FOOT SURGERY  2018   HAND SURGERY  2019   FAMILY HISTORY Family History  Problem Relation Age of Onset   Hyperlipidemia Mother    Hypertension Mother    Kidney disease Father    Alzheimer's disease Father    SOCIAL HISTORY Social History   Tobacco Use   Smoking status: Never   Smokeless tobacco: Never  Vaping Use   Vaping Use: Never used  Substance Use Topics   Alcohol use: Never   Drug use: Never       OPHTHALMIC EXAM:  Base Eye Exam     Visual Acuity (Snellen - Linear)       Right Left   Dist [place] 20/40 20/25 +1   Dist ph [place] 20/40 +1 NI         Tonometry (Tonopen, 9:55 AM)       Right Left   Pressure 15 14         Pupils       Dark Light Shape React APD   Right 3 2 Round Brisk None   Left 3 2 Round Brisk None         Visual Fields (Counting fingers)       Left Right    Full    Restrictions  Partial outer superior temporal, superior nasal deficiencies         Extraocular Movement       Right Left    Full, Ortho Full, Ortho         Neuro/Psych     Oriented x3: Yes   Mood/Affect: Normal         Dilation     Both eyes: 1.0% Mydriacyl, 2.5% Phenylephrine @ 9:56 AM           Slit Lamp and Fundus Exam     Slit Lamp Exam       Right Left   Lids/Lashes normal normal   Conjunctiva/Sclera mild melanosis meld milanosis   Cornea Clear Clear   Anterior Chamber deep and quiet deep and  quiet   Iris round and dilated, no NVI round and dilated, no NVI   Lens Clear Clear   Anterior Vitreous syneresis syneresis         Fundus Exam       Right Left   Disc pink and sharp; compact; mild hyperemia; +fibrosis w/ early, no  NVD; +PPP pink and sharp; compact; +fibrosis w/ early fine NVD - regressing   C/D Ratio 0.2 0.2   Macula blunted foveal reflex, scattered MA/DBH, exudates, +CWS, fine NVE inferior and temporal macula - persistent - ?regressing good foveal reflex, ERM with stria, scattered MA/DBH, exudates temporally, scattered fibrosis and NV   Vessels attenuated, Tortuous, +NVE greatest inferior arcades attenuated, copper wiring, +NVE -- scattered   Periphery attached, 360 MA/DBH, good 360 PRP laser changes and posterior fill in, scattered NV attached, 360 MA/DBH, good 360 PRP, scattered NV           Refraction     Manifest Refraction       Sphere Cylinder Axis Dist VA   Right -1.00 +0.25 025 20/30-1   Left                IMAGING AND PROCEDURES  Imaging and Procedures for 04/27/2022  OCT, Retina - OU - Both Eyes       Right Eye Quality was good. Central Foveal Thickness: 277. Progression has improved. Findings include intraretinal hyper-reflective material, vitreomacular adhesion , preretinal fibrosis, intraretinal fluid, no SRF, normal foveal contour (interval improvement in IRF / IRHM, +PRF overlying disc).   Left Eye Quality was good. Central Foveal Thickness: 308. Progression has improved. Findings include no SRF, preretinal fibrosis, macular pucker, vitreous traction, epiretinal membrane, abnormal foveal contour, intraretinal fluid (Mild interval improvement in cystic changes temporal macula, pre retinal fibrosis overlying disc extending to macula w/ mild traction).   Notes *Images captured and stored on drive  Diagnosis / Impression:  PDR w/ DME OU OD: interval improvement in IRF / IRHM, +PRF overlying disc OS: Mild interval improvement in cystic  changes temporal macula, pre retinal fibrosis overlying disc extending to macula w/ mild traction  Clinical management:  See below  Abbreviations: NFP - Normal foveal profile. CME - cystoid macular edema. PED - pigment epithelial detachment. IRF - intraretinal fluid. SRF - subretinal fluid. EZ - ellipsoid zone. ERM - epiretinal membrane. ORA - outer retinal atrophy. ORT - outer retinal tubulation. SRHM - subretinal hyper-reflective material. IRHM - intraretinal hyper-reflective material            ASSESSMENT/PLAN:    ICD-10-CM   1. 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 Epiretinal membrane (ERM) of both eyes  H35.373     3. High-risk pregnancy in second trimester  O09.92     4. Essential hypertension  I10     5. Hypertensive retinopathy of both eyes  H35.033      1,2. 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, both eyes  - s/p PRP OD (03.27.23), fill in (05.15.23)  - s/p PRP OS (04.17.23) - last A1c 8.4 on 04.06.23; 10.4 on 01.23.23 - BCVA OD: 20/40 OS: 20/25 -- decreased OD - exam shows early +NVD improving and persistent NVE OU - now w/ good PRP laser OU w/ good fill in OD - OCT OD: interval improvement in IRF / IRHM, +PRF overlying disc; OS: Mild interval improvement in cystic changes temporal macula, pre retinal fibrosis overlying disc extending to macula w/ mild traction - no treatment recommended today - f/u 4-6 wks  3-5. High risk pregnancy w/ +HTN and hypertensive retinopathy  - Epic lists her as [redacted]w[redacted]d- pt w/ history of pre-eclampsia with prior pregnancy - discussed importance of tight BP control  -  monitor  Ophthalmic Meds Ordered this visit:  No orders of the defined types were placed in this encounter.    Return for f/u 4-6 weeks, PDR OU, DFE, OCT.  There are no Patient Instructions on file for this visit.  Explained the  diagnoses, plan, and follow up with the patient and they expressed understanding.  Patient expressed understanding of the importance of proper follow up care.   This document serves as a record of services personally performed by Gardiner Sleeper, MD, PhD. It was created on their behalf by Roselee Nova, COMT. The creation of this record is the provider's dictation and/or activities during the visit.  Electronically signed by: Roselee Nova, COMT 04/29/22 5:21 PM  This document serves as a record of services personally performed by Gardiner Sleeper, MD, PhD. It was created on their behalf by San Jetty. Owens Shark, OA an ophthalmic technician. The creation of this record is the provider's dictation and/or activities during the visit.    Electronically signed by: San Jetty. Owens Shark, [place] 06.05.2023 5:21 PM  Gardiner Sleeper, M.D., Ph.D. Diseases & Surgery of the Retina and Vitreous [facility]  I have reviewed the above documentation for accuracy and completeness, and I agree with the above. Gardiner Sleeper, M.D., Ph.D. 04/29/22 5:22 PM   Abbreviations: M myopia (nearsighted); A astigmatism; H hyperopia (farsighted); P presbyopia; Mrx spectacle prescription;  CTL contact lenses; OD right eye; OS left eye; OU both eyes  XT exotropia; ET esotropia; PEK punctate epithelial keratitis; PEE punctate epithelial erosions; DES dry eye syndrome; MGD meibomian gland dysfunction; ATs artificial tears; PFAT's preservative free artificial tears; [place] nuclear sclerotic cataract; PSC posterior subcapsular cataract; ERM epi-retinal membrane; PVD posterior vitreous detachment; RD retinal detachment; DM diabetes mellitus; DR diabetic retinopathy; NPDR non-proliferative diabetic retinopathy; PDR proliferative diabetic retinopathy; CSME clinically significant macular edema; DME diabetic macular edema; dbh dot blot hemorrhages; CWS cotton wool spot; POAG primary open angle glaucoma; C/D cup-to-disc ratio; HVF  humphrey visual field; GVF goldmann visual field; OCT optical coherence tomography; IOP intraocular pressure; BRVO Branch retinal vein occlusion; CRVO central retinal vein occlusion; CRAO central retinal artery occlusion; BRAO branch retinal artery occlusion; RT retinal tear; SB scleral buckle; PPV pars plana vitrectomy; VH Vitreous hemorrhage; PRP panretinal laser photocoagulation; IVK intravitreal kenalog; VMT vitreomacular traction; MH Macular hole;  NVD neovascularization of the disc; NVE neovascularization elsewhere; AREDS age related eye disease study; ARMD age related macular degeneration; POAG primary open angle glaucoma; EBMD epithelial/anterior basement membrane dystrophy; ACIOL anterior chamber intraocular lens; IOL intraocular lens; PCIOL posterior chamber intraocular lens; Phaco/IOL phacoemulsification with intraocular lens placement; Meta photorefractive keratectomy; LASIK laser assisted in situ keratomileusis; HTN hypertension; DM diabetes mellitus; COPD chronic obstructive pulmonary disease

## 2022-04-24 NOTE — Patient Instructions (Addendum)
Medication Instructions:  Your physician recommends that you continue on your current medications as directed. Please refer to the Current Medication list given to you today.   Take blood pressure daily. If greater than 150/90 please call the office. [phone].   *If you need a refill on your cardiac medications before your next appointment, please call your pharmacy*   Lab Work: None If you have labs (blood work) drawn today and your tests are completely normal, you will receive your results only by: MyChart Message (if you have MyChart) OR A paper copy in the mail If you have any lab test that is abnormal or we need to change your treatment, we will call you to review the results.   Testing/Procedures: None   Follow-Up: At [facility], you and your health needs are our priority.  As part of our continuing mission to provide you with exceptional heart care, we have created designated Provider Care Teams.  These Care Teams include your primary Cardiologist (physician) and Advanced Practice Providers (APPs -  Physician Assistants and Nurse Practitioners) who all work together to provide you with the care you need, when you need it.  We recommend signing up for the patient portal called "MyChart".  Sign up information is provided on this After Visit Summary.  MyChart is used to connect with patients for Virtual Visits (Telemedicine).  Patients are able to view lab/test results, encounter notes, upcoming appointments, etc.  Non-urgent messages can be sent to your provider as well.   To learn more about what you can do with MyChart, go to [email or web address].    Your next appointment:   Oct 6 at 1:20pm  The format for your next appointment:   In Person  Provider:   Thomasene Ripple, DO    Other Instructions   Important Information About Sugar

## 2022-04-27 ENCOUNTER — Encounter (INDEPENDENT_AMBULATORY_CARE_PROVIDER_SITE_OTHER): Payer: Self-pay | Admitting: Ophthalmology

## 2022-04-27 ENCOUNTER — Ambulatory Visit (INDEPENDENT_AMBULATORY_CARE_PROVIDER_SITE_OTHER): Payer: Medicaid Other | Admitting: Ophthalmology

## 2022-04-27 ENCOUNTER — Telehealth: Payer: Self-pay

## 2022-04-27 DIAGNOSIS — H35373 Puckering of macula, bilateral: Secondary | ICD-10-CM

## 2022-04-27 DIAGNOSIS — E103513 Type 1 diabetes mellitus with proliferative diabetic retinopathy with macular edema, bilateral: Secondary | ICD-10-CM | POA: Diagnosis not present

## 2022-04-27 DIAGNOSIS — O0992 Supervision of high risk pregnancy, unspecified, second trimester: Secondary | ICD-10-CM

## 2022-04-27 DIAGNOSIS — H35033 Hypertensive retinopathy, bilateral: Secondary | ICD-10-CM

## 2022-04-27 DIAGNOSIS — I10 Essential (primary) hypertension: Secondary | ICD-10-CM | POA: Diagnosis not present

## 2022-04-27 NOTE — Telephone Encounter (Signed)
Spoke to [employer] and discussed recent admission . Pt was having hypoglycemia and was holding off on insulin intake which ended up in DKA    She missed her 5/10th with me , will send a staff message to schedule the pt with me      Abby Raelyn Mora, MD  [facility] Endocrinology  [facility] [address] [place], [place] [address] Phone: [phone] FAX: [phone]

## 2022-04-27 NOTE — Telephone Encounter (Signed)
Lauren would like to speak with you regarding patient diabetes care.  Patient was in the hospital and some adjustment were made to medication regiment.

## 2022-04-27 NOTE — Telephone Encounter (Signed)
Patient scheduled for tomorrow 04/28/22

## 2022-04-27 NOTE — Progress Notes (Unsigned)
Name: Kristin Clay  MRN/ DOB: [id number], Mar 25, 1986   Age/ Sex: 36 y.o., female    PCP: Dorna Mai, MD   Reason for Endocrinology Evaluation: Type 1 Diabetes Mellitus     Date of Initial Endocrinology Visit: 03/18/2022    PATIENT IDENTIFIER: Kristin Clay is a 36 y.o. female with a past medical history of T1 DM. The patient presented for initial endocrinology clinic visit on 03/18/2022 for consultative assistance with her diabetes management.    HPI: Kristin Clay was    Diagnosed with T1DM at age 63          Hemoglobin A1c has ranged from 8.4% in 2023, peaking at 13.0% in 2020. Patient has required hospitalization within the last 1 year from hyper or hypoglycemia: Yes 02/2022 with DKA  She is currently 24.5 weeks of gestation with a boy  She has 20 months old baby   Paternal aunts with T1 diabetes   Pt with tingling of hands and feet, she has partial ring finger amputations B/L   SUBJECTIVE:   During the last visit (03/18/2022): A1c 8.4% adjusted MDI regimen  Today (04/28/22): Kristin Clay is here for a follow up on diabetes management. She checks  her blood sugars multiple  times daily, through CGM. Marland Kitchen The patient has had hypoglycemic episodes since the last clinic visit.  The patient had recent ED visit for DKA   The patient ended up with DKA because she was skipping on her prandial dose of insulin, per patient this was due to recurrent hypoglycemia that was caused by recurrent vomiting.  She was discharged on much smaller doses of insulin such as Lantus 22 units daily but the patient continues to take twice daily dosing of Lantus as below   Today she is throwing up in the office today and has been throwing up for 2 days, has heartburn  She has fatigue and arthralgia   She is currently using freestyle libre, she tells me this was given to her at the hospital, Dexcom stop working but she is not sure why or how?  EDD 07/03/2022  HOME  DIABETES REGIMEN: Lantus 22 units daily - takes 25 units QAM and 20 units QPM  Novolog 5 units TIDQAC Correction Factor: NOvolog (BG-130/20)      CONTINUOUS GLUCOSE MONITORING RECORD INTERPRETATION: Manual review showed 2 checks over the past 10 days     DIABETIC COMPLICATIONS: Microvascular complications:  Left eye retinopathy (S/P laser treatment )  Denies: CKD  Last eye exam: Completed 04/27/2022  Macrovascular complications:   Denies: CAD, PVD, CVA   PAST HISTORY: Past Medical History:  Past Medical History:  Diagnosis Date   Chronic hypertension    Complication of anesthesia    with hand surgery, local anes did not work, tried twice- had to put her to sleep   Diabetic retinopathy ([place])    DKA (diabetic ketoacidosis) ([place]) 10/2020   Epidural hematoma ([place]) 07/04/2021   Obstetric vaginal laceration with type 3c third degree perineal laceration 06/29/2021   Pyelonephritis 10/2020   Rh negative status during pregnancy 05/01/2021   s/p Rhogam 05/13/21   Type I diabetes mellitus ([place])    dx 62yr ago   UTI (urinary tract infection)    Past Surgical History:  Past Surgical History:  Procedure Laterality Date   female circumcision Bilateral    clitorectomy as well   FOOT SURGERY  2018   HAND SURGERY  2019    Social History:  reports that she has never smoked. She has never used smokeless tobacco. She reports that she does not drink alcohol and does not use drugs. Family History:  Family History  Problem Relation Age of Onset   Hyperlipidemia Mother    Hypertension Mother    Kidney disease Father    Alzheimer's disease Father      HOME MEDICATIONS: Allergies as of 04/28/2022       Reactions   Pork-derived Products         Medication List        Accurate as of April 28, 2022  1:13 PM. If you have any questions, ask your nurse or doctor.          Accu-Chek Guide test strip Generic drug: glucose blood Use as instructed; check blood glucose 4 times  daily   Accu-Chek Guide w/Device Kit use as directed 4 (four) times daily.   Accu-Chek Softclix Lancets lancets Use as instructed; check blood glucose 4 times daily   acetaminophen 500 MG tablet Commonly known as: TYLENOL Take 1,000 mg by mouth every 6 (six) hours as needed.   aspirin 81 MG chewable tablet Commonly known as: Aspirin Low Dose CHEW 1 TABLET BY MOUTH DAILY.   Dexcom G6 Transmitter Misc 1 Device by Does not apply route every 3 (three) months.   insulin glargine 100 UNIT/ML Solostar Pen Commonly known as: LANTUS Inject 22 units subcutaneously nightly. What changed: additional instructions   Insulin Pen Needle 32G X 4 MM Misc 1 Device by Does not apply route in the morning, at noon, in the evening, and at bedtime.   labetalol 200 MG tablet Commonly known as: NORMODYNE Take 2 tablets (400 mg total) by mouth 3 (three) times daily.   metoCLOPramide 10 MG tablet Commonly known as: Reglan Take 1 tablet (10 mg total) by mouth 3 (three) times daily before meals.   NIFEdipine 90 MG 24 hr tablet Commonly known as: PROCARDIA XL/NIFEDICAL-XL Take 1 tablet (90 mg total) by mouth daily.   NovoLOG FlexPen 100 UNIT/ML FlexPen Generic drug: insulin aspart Inject 3 units subcutaneously with meals.   Omnipod 5 G6 Intro (Gen 5) Kit 1 Device by Does not apply route every 3 (three) days.   Omnipod 5 G6 Pod (Gen 5) Misc 1 Device by Does not apply route every 3 (three) days.   ondansetron 4 MG disintegrating tablet Commonly known as: ZOFRAN-ODT Take 1 tablet (4 mg total) by mouth every 6 (six) hours as needed for nausea.   pantoprazole 20 MG tablet Commonly known as: Protonix Take 2 tablets (40 mg total) by mouth daily.   Prenatal Vitamin 27-0.8 MG Tabs Take 1 tablet by mouth daily.         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  Neuro: MS is good with appropriate affect, pt is alert and Ox3    DM foot exam: 03/18/2022  The skin of the feet is intact without sores or ulcerations. The pedal pulses are 2+ on right and 2+ on left. The sensation is decreased to a screening 5.07, 10 gram monofilament bilaterally   DATA REVIEWED:  Lab Results  Component Value Date   HGBA1C 7.4 (A) 04/28/2022   HGBA1C 8.4 (H) 02/26/2022   HGBA1C 10.4 (H) 12/15/2021    Latest Reference Range & Units 04/28/22 13:30  Sodium 135 - 145 mEq/L 136  Potassium 3.5 - 5.1 mEq/L 4.0  Chloride 96 - 112 mEq/L 103  CO2 19 - 32 mEq/L 25  Glucose 70 - 99 mg/dL 112 (H)  BUN 6 - 23 mg/dL 8  Creatinine 0.40 - 1.20 mg/dL 0.43  Calcium 8.4 - 10.5 mg/dL 9.0  GFR >60.00 mL/min 124.76  (H): Data is abnormally high  ASSESSMENT / PLAN / RECOMMENDATIONS:   1) Type 1 Diabetes Mellitus, Sub-Optimally controlled, With neuropathic, and retinopathic and neuropathic complications - Most recent A1c of  4.4%. Goal A1c < 6.5 %.    -Her A1c is trending down -Patient with multiple barriers including language, she assures me she is able to read numbers in [place] -She has been having intermittent vomiting hence she skips on prandial dose of insulin, today she was advised that if she skips on prandial insulin then she does need to use the correction scale if it is needed to prevent hypoglycemia -She was previously on Dexcom but she tells me it  stopped working and she could not understand the message on the device, she was given freestyle libre at the hospital but today we were not able to scan her sensor, I have refilled the Dexcom again and I have advised her to try and work with it she may need to calibrate, also Dexcom is the preferred CGM in the setting of OmniPod use -Today she has her OmniPod pump with her, she was under the impression she will get trained on this today.  She tells me she was scheduled for OmniPod training but she missed that appointment due to late arrival, patient is known for late arrival to her appointment, and I have advised her again to leave 45 minutes before her appointment time -I have sent a message to the manager of nutrition and diabetes department to see if we can get this patient treated ASAP -Due to lack of glucose data I am unable to change her insulin dosing -In office BG today is 130 mg/DL, patient ate bread and yogurt and did not take her prandial dose of NovoLog, she only took Lantus -BMP today does not show any evidence of DKA  MEDICATIONS: Continue Lantus 25 units QAM and 20 units QPM NovoLog 5 units 3 times daily before every meal Correction factor: NovoLog (BG -130/30)  EDUCATION / INSTRUCTIONS: BG monitoring instructions: Patient is instructed to check her blood sugars 4 times a day, fasting and 2 hours post meals. Call [facility] if: BG persistently < 60  I reviewed the Rule of 15 for the treatment of hypoglycemia in detail with the patient. Literature supplied.   2) Diabetic complications:  Eye: Does  have known diabetic retinopathy.  Neuro/ Feet: Does  have known diabetic peripheral neuropathy. Renal: Patient does not have known baseline CKD. She is not on an ACEI/ARB at present.   Follow-up in 2 weeks  on 6/30th at 3 pm ( this was verbally communicated with the pt through me today )   I spent 25 minutes preparing to see the patient by review of recent labs,  imaging and procedures, obtaining and reviewing separately obtained history, communicating with the patient/family or caregiver, ordering medications, tests or procedures, and documenting clinical information in the EHR including the differential Dx, treatment, and any further evaluation and other management       Signed electronically by: Mack Guise, MD  [facility]  [facility] [address] [place], [place] [address] Phone: [phone] FAX: [phone]   CC: Dorna Mai, [place]  [place] [address] Phone: [phone]  Fax: [phone]    Return to Endocrinology clinic as below: Future Appointments  Date Time Provider [place]  05/04/2022  9:15 AM Aletha Halim, MD [facility]  05/08/2022 10:15 AM [facility] NURSE [facility]  05/08/2022 10:30 AM [facility] US2 [facility]  05/15/2022 10:30 AM [facility] NURSE [facility]  05/15/2022 10:45 AM [facility] US6 [facility]  05/18/2022  9:15 AM Aletha Halim, MD [facility]  05/22/2022  9:45 AM [facility] US5 [facility]  05/25/2022  9:30 AM Bernarda Caffey, MD [facility] None  05/29/2022 11:00 AM [facility] US1 [facility]  08/28/2022  1:20 PM Tobb, Godfrey Pick, DO [facility] None

## 2022-04-27 NOTE — Telephone Encounter (Signed)
She is a Statistician who was working with patient while in hospital and wanted to touch base about diabetes treatment.     CB [phone]

## 2022-04-28 ENCOUNTER — Ambulatory Visit (INDEPENDENT_AMBULATORY_CARE_PROVIDER_SITE_OTHER): Payer: Medicaid Other | Admitting: Internal Medicine

## 2022-04-28 ENCOUNTER — Encounter: Payer: Self-pay | Admitting: Internal Medicine

## 2022-04-28 VITALS — BP 124/80 | HR 80 | Ht 62.0 in | Wt 116.4 lb

## 2022-04-28 DIAGNOSIS — E1065 Type 1 diabetes mellitus with hyperglycemia: Secondary | ICD-10-CM

## 2022-04-28 DIAGNOSIS — E103592 Type 1 diabetes mellitus with proliferative diabetic retinopathy without macular edema, left eye: Secondary | ICD-10-CM | POA: Diagnosis not present

## 2022-04-28 DIAGNOSIS — E1042 Type 1 diabetes mellitus with diabetic polyneuropathy: Secondary | ICD-10-CM | POA: Diagnosis not present

## 2022-04-28 LAB — POCT GLUCOSE (DEVICE FOR HOME USE): POC Glucose: 130 mg/dl — AB (ref 70–99)

## 2022-04-28 LAB — BASIC METABOLIC PANEL
BUN: 8 mg/dL (ref 6–23)
CO2: 25 mEq/L (ref 19–32)
Calcium: 9 mg/dL (ref 8.4–10.5)
Chloride: 103 mEq/L (ref 96–112)
Creatinine, Ser: 0.43 mg/dL (ref 0.40–1.20)
GFR: 124.76 mL/min (ref >60.00)
Glucose, Bld: 112 mg/dL — ABNORMAL HIGH (ref 70–99)
Potassium: 4 mEq/L (ref 3.5–5.1)
Sodium: 136 mEq/L (ref 135–145)

## 2022-04-28 LAB — POCT GLYCOSYLATED HEMOGLOBIN (HGB A1C): Hemoglobin A1C: 7.4 % — AB (ref 4.0–5.6)

## 2022-04-28 MED ORDER — DEXCOM G6 SENSOR MISC
1.0000 | 3 refills | Status: DC
Start: 2022-04-28 — End: 2022-08-28

## 2022-04-28 MED ORDER — DEXCOM G6 TRANSMITTER MISC: 1.0000 | 1 refills | Status: DC

## 2022-04-28 NOTE — Patient Instructions (Signed)
Lantus 25 units in the morning and 20 units in the afternoon  Novolog 5 units with each meal  Novolog correctional insulin: ADD extra units on insulin to your meal-time Novolog dose if your blood sugars are higher than 130. Use the scale below to help guide you:   Blood sugar before meal Number of units to inject  Less than 130 0 unit  131 -  160 1 units  161 -  190 2 units  191 -  225 3 units  226 -  250 4 units  251 -  280 5 units  281 -  310 6 units  311 -  340 7 units  341 -  370 8 units  371- 400 9 units         HOW TO TREAT LOW BLOOD SUGARS (Blood sugar LESS THAN 70 MG/DL) Please follow the RULE OF 15 for the treatment of hypoglycemia treatment (when your (blood sugars are less than 70 mg/dL)   STEP 1: Take 15 grams of carbohydrates when your blood sugar is low, which includes:  3-4 GLUCOSE TABS  OR 3-4 OZ OF JUICE OR REGULAR SODA OR ONE TUBE OF GLUCOSE GEL    STEP 2: RECHECK blood sugar in 15 MINUTES STEP 3: If your blood sugar is still low at the 15 minute recheck --> then, go back to STEP 1 and treat AGAIN with another 15 grams of carbohydrates.

## 2022-04-29 ENCOUNTER — Encounter (INDEPENDENT_AMBULATORY_CARE_PROVIDER_SITE_OTHER): Payer: Self-pay | Admitting: Ophthalmology

## 2022-04-29 ENCOUNTER — Telehealth: Payer: Self-pay | Admitting: Nutrition

## 2022-04-29 NOTE — Telephone Encounter (Signed)
LVM to call me to schedule her pump training.  Telephone number given

## 2022-04-29 NOTE — Telephone Encounter (Signed)
Phoned patient again and got Voice mail.

## 2022-04-30 ENCOUNTER — Encounter (HOSPITAL_COMMUNITY): Payer: Self-pay | Admitting: Obstetrics and Gynecology

## 2022-04-30 ENCOUNTER — Inpatient Hospital Stay (HOSPITAL_COMMUNITY)
Admission: AD | Admit: 2022-04-30 | Discharge: 2022-04-30 | Disposition: A | Discharge status: Home / Self Care | Payer: Medicaid Other | Attending: Obstetrics and Gynecology | Admitting: Obstetrics and Gynecology

## 2022-04-30 ENCOUNTER — Other Ambulatory Visit: Payer: Self-pay

## 2022-04-30 DIAGNOSIS — O10919 Unspecified pre-existing hypertension complicating pregnancy, unspecified trimester: Secondary | ICD-10-CM

## 2022-04-30 DIAGNOSIS — O09299 Supervision of pregnancy with other poor reproductive or obstetric history, unspecified trimester: Secondary | ICD-10-CM

## 2022-04-30 DIAGNOSIS — Z3A30 30 weeks gestation of pregnancy: Secondary | ICD-10-CM | POA: Insufficient documentation

## 2022-04-30 DIAGNOSIS — O10913 Unspecified pre-existing hypertension complicating pregnancy, third trimester: Secondary | ICD-10-CM | POA: Diagnosis present

## 2022-04-30 DIAGNOSIS — Z8639 Personal history of other endocrine, nutritional and metabolic disease: Secondary | ICD-10-CM | POA: Diagnosis not present

## 2022-04-30 DIAGNOSIS — O219 Vomiting of pregnancy, unspecified: Secondary | ICD-10-CM | POA: Diagnosis not present

## 2022-04-30 DIAGNOSIS — O24113 Pre-existing diabetes mellitus, type 2, in pregnancy, third trimester: Secondary | ICD-10-CM | POA: Insufficient documentation

## 2022-04-30 DIAGNOSIS — O4700 False labor before 37 completed weeks of gestation, unspecified trimester: Secondary | ICD-10-CM

## 2022-04-30 DIAGNOSIS — O26899 Other specified pregnancy related conditions, unspecified trimester: Secondary | ICD-10-CM

## 2022-04-30 DIAGNOSIS — O0990 Supervision of high risk pregnancy, unspecified, unspecified trimester: Secondary | ICD-10-CM

## 2022-04-30 DIAGNOSIS — O1213 Gestational proteinuria, third trimester: Secondary | ICD-10-CM

## 2022-04-30 LAB — CBC
HCT: 46.7 % — ABNORMAL HIGH (ref 36.0–46.0)
Hemoglobin: 15.9 g/dL — ABNORMAL HIGH (ref 12.0–15.0)
MCH: 29.2 pg (ref 26.0–34.0)
MCHC: 34 g/dL (ref 30.0–36.0)
MCV: 85.7 fL (ref 80.0–100.0)
Platelets: 301 10*3/uL (ref 150–400)
RBC: 5.45 MIL/uL — ABNORMAL HIGH (ref 3.87–5.11)
RDW: 13.5 % (ref 11.5–15.5)
WBC: 4.5 10*3/uL (ref 4.0–10.5)
nRBC: 0 % (ref 0.0–0.2)

## 2022-04-30 LAB — COMPREHENSIVE METABOLIC PANEL
ALT: 9 U/L (ref 0–44)
ALT: 8 U/L (ref 0–44)
AST: 14 U/L — ABNORMAL LOW (ref 15–41)
AST: 12 U/L — ABNORMAL LOW (ref 15–41)
Albumin: 3 g/dL — ABNORMAL LOW (ref 3.5–5.0)
Albumin: 2.5 g/dL — ABNORMAL LOW (ref 3.5–5.0)
Alkaline Phosphatase: 75 U/L (ref 38–126)
Alkaline Phosphatase: 60 U/L (ref 38–126)
Anion gap: 16 — ABNORMAL HIGH (ref 5–15)
Anion gap: 12 (ref 5–15)
BUN: 11 mg/dL (ref 6–20)
BUN: 10 mg/dL (ref 6–20)
CO2: 19 mmol/L — ABNORMAL LOW (ref 22–32)
CO2: 16 mmol/L — ABNORMAL LOW (ref 22–32)
Calcium: 8.9 mg/dL (ref 8.9–10.3)
Calcium: 8.3 mg/dL — ABNORMAL LOW (ref 8.9–10.3)
Chloride: 101 mmol/L (ref 98–111)
Chloride: 106 mmol/L (ref 98–111)
Creatinine, Ser: 0.61 mg/dL (ref 0.44–1.00)
Creatinine, Ser: 0.48 mg/dL (ref 0.44–1.00)
GFR, Estimated: >60 mL/min (ref >60)
GFR, Estimated: >60 mL/min (ref >60)
Glucose, Bld: 101 mg/dL — ABNORMAL HIGH (ref 70–99)
Glucose, Bld: 81 mg/dL (ref 70–99)
Potassium: 3.7 mmol/L (ref 3.5–5.1)
Potassium: 3.1 mmol/L — ABNORMAL LOW (ref 3.5–5.1)
Sodium: 136 mmol/L (ref 135–145)
Sodium: 134 mmol/L — ABNORMAL LOW (ref 135–145)
Total Bilirubin: 1.5 mg/dL — ABNORMAL HIGH (ref 0.3–1.2)
Total Bilirubin: 1.4 mg/dL — ABNORMAL HIGH (ref 0.3–1.2)
Total Protein: 7.3 g/dL (ref 6.5–8.1)
Total Protein: 6 g/dL — ABNORMAL LOW (ref 6.5–8.1)

## 2022-04-30 LAB — URINALYSIS, ROUTINE W REFLEX MICROSCOPIC
Bilirubin Urine: NEGATIVE
Glucose, UA: NEGATIVE mg/dL
Ketones, ur: 80 mg/dL — AB
Nitrite: NEGATIVE
Protein, ur: >=300 mg/dL — AB
Specific Gravity, Urine: 1.026 (ref 1.005–1.030)
pH: 5 (ref 5.0–8.0)

## 2022-04-30 LAB — BETA-HYDROXYBUTYRIC ACID
Beta-Hydroxybutyric Acid: 4.43 mmol/L — ABNORMAL HIGH (ref 0.05–0.27)
Beta-Hydroxybutyric Acid: 3.7 mmol/L — ABNORMAL HIGH (ref 0.05–0.27)

## 2022-04-30 MED ORDER — ACETAMINOPHEN 500 MG PO TABS
1000.0000 mg | ORAL_TABLET | Freq: Once | ORAL | Status: AC
  Administered 2022-04-30: 1000 mg via ORAL
  Filled 2022-04-30: qty 2

## 2022-04-30 MED ORDER — LACTATED RINGERS IV BOLUS
1000.0000 mL | Freq: Once | INTRAVENOUS | Status: AC
  Administered 2022-04-30: 1000 mL via INTRAVENOUS

## 2022-04-30 MED ORDER — NIFEDIPINE ER OSMOTIC RELEASE 30 MG PO TB24
90.0000 mg | ORAL_TABLET | Freq: Once | ORAL | Status: AC
  Administered 2022-04-30: 90 mg via ORAL
  Filled 2022-04-30: qty 3

## 2022-04-30 MED ORDER — CYCLOBENZAPRINE HCL 5 MG PO TABS
10.0000 mg | ORAL_TABLET | Freq: Once | ORAL | Status: AC
  Administered 2022-04-30: 10 mg via ORAL
  Filled 2022-04-30: qty 2

## 2022-04-30 MED ORDER — SODIUM CHLORIDE 0.9 % IV SOLN
8.0000 mg | Freq: Once | INTRAVENOUS | Status: AC
  Administered 2022-04-30: 8 mg via INTRAVENOUS
  Filled 2022-04-30: qty 4

## 2022-04-30 NOTE — MAU Note (Signed)
[name] LAVEYAH ORIOL is a 36 y.o. at [redacted]w[redacted]d here in MAU reporting: constant lower back pain that began 2 days ago and ctxs that began yesterday.  Reports has been taking Tylenol, but no relief, last taken @ 1000 this morning.  Denies VB or LOF.  Reports no FB since yesterday.  States has been vomiting for 3 days, states unable to keep anything down, states taking all meds as prescribed.  Onset of complaint: 2-3 days ago Pain score: back 8/10 & 7/10 ctxs Vitals:   04/30/22 1219  BP: (!) 153/93  Pulse: (!) 107  Resp: 18  Temp: 97.7 F (36.5 C)  SpO2: 100%     FHT:148 bpm Lab orders placed from triage:   UA

## 2022-04-30 NOTE — MAU Note (Signed)
Pain is constant, started yesterday, getting worse.

## 2022-04-30 NOTE — Progress Notes (Addendum)
Inpatient Diabetes Program Recommendations  AACE/ADA: New Consensus Statement on Inpatient Glycemic Control (2015)  Target Ranges:  Prepandial:   less than 140 mg/dL      Peak postprandial:   less than 180 mg/dL (1-2 hours)      Critically ill patients:  140 - 180 mg/dL   Lab Results  Component Value Date   GLUCAP 91 04/23/2022   HGBA1C 7.4 (A) 04/28/2022   Diabetes history: Type 1 DM History: Type 1 Diabetes   Home DM Meds: Dexcom G6 CGM                             Lantus 25 units AM/ 30 units PM                             Novolog 15 units TID with meals                             Novolog 0-9 units TID for postprandial correction Current: none  Addendum @1536 : Recommend IV insulin per Diabetes Treatment order set. Discussed with Dr via secure chat. Question starvation ketosis given euglycemia in the setting of elevation anion gap and beta hydroxybutyric acid?    Spoke with patient regarding reason for presentation, assuming related to diabetes. Patient was recently discharge from the hospital on 04/23/22 on Semglee 22 units QD, Novolog 3 units TID, Novolog 0-6 units TID for post prandial correction and 0-6 units Q4H in the event nausea/vomiting symptoms occur. Discussed patient and disposition plan with Dr 06/23/22 on 6/3 including doses, reason for admission, plan forward to include dosages while training for Omnipod. Dr 8/3 reached out to patient for follow up and discuss plan.  Was then seen by outpatient endocrinology, Dr Lonzo Cloud on 04/28/22 who resumed previous dosages prior to hospitalization per note.   Patient is currently wearing Freestyle libre and awaiting plan for Omnipod and Dexcom. In reviewing, patient has scanned 4 times since discharge with a range in the 90-130's mg/dL. Patient reports 4 times per day. When asked about discrepancy she became irritated.  Has experienced nausea and vomiting for last three days and has not had short acting insulin since.  Did not take shorting as discussed during discharge.  Additionally, patient reports taking basal insulin as directed by Dr 06/28/22. Denies having lows. She reports "forgetting our conversation" and not having her paperwork for instructions. Denies issues with hypoglycemia.  Question patient appropriateness for Omnipod use.  Discussed plan and previous admission with resident and Dr Lonzo Cloud. Labs placed. Will follow.   Thanks, Crissie Reese, MSN, RNC-OB Diabetes Coordinator [phone] (8a-5p)

## 2022-04-30 NOTE — MAU Provider Note (Incomplete)
History     [id number]  Arrival date and time: 04/30/22 1158    Chief Complaint  Patient presents with   Back Pain   Emesis   Contractions     HPI Kristin Clay H Ficken is a 36 y.o. at 87w6dwith PMHx notable for cHTN, DM1, Rh neg, female circumcision, who presents for back pain.   Recently admitted from 04/18/2022 - 04/23/2022 for DKA  Today reports ***  ***merge into HPI Vaginal bleeding: {yes/no:20286} LOF: {yes/no:20286} Fetal Movement: {yes/no:20286} Contractions: {yes/no:20286}  --/--/A NEG (02/13 1210)  {GYN/OB HNT:[id number]} Past Medical History:  Diagnosis Date   Chronic hypertension    Complication of anesthesia    with hand surgery, local anes did not work, tried twice- had to put her to sleep   [place]opathy [place]    DKA (diabetic [place]etoacidosis) ([place] 10/2020   Epidural hematoma ([facility] 07/04/2021   Obstetric vaginal laceration with type 3c third degree perineal laceration 06/29/2021   Pyelonephritis 10/2020   Rh negative status during pregnancy 05/01/2021   s/p Rhogam 05/13/21   Type I diabetes mellitus ([place]    dx 121yrago   UTI (urinary tract infection)     Past Surgical History:  Procedure Laterality Date   female circumcision Bilateral    clitorectomy as well   FOOT SURGERY  2018   HAND SURGERY  2019    Family History  Problem Relation Age of Onset   Hyperlipidemia Mother    Hypertension Mother    Kidney disease Father    Alzheimer's disease Father     Social History   Socioeconomic History   Marital status: Married    Spouse name: Not on file   Number of children: Not on file   Years of education: Not on file   Highest education level: Not on file  Occupational History   Not on file  Tobacco Use   Smoking status: Never   Smokeless tobacco: Never  Vaping Use   Vaping Use: Never used  Substance and Sexual Activity   Alcohol use: Never   Drug use: Never   Sexual activity: Not Currently    Birth control/protection:  None  Other Topics Concern   Not on file  Social History Narrative   ** Merged History Encounter **       ** Merged History Encounter **       Social Determinants of Health   Financial Resource Strain: High Risk (05/30/2021)   Overall Financial Resource Strain (CARDIA)    Difficulty of Paying Living Expenses: Very hard  Food Insecurity: Food Insecurity Present (03/12/2022)   Hunger Vital Sign    Worried About Running Out of Food in the Last Year: Sometimes true    Ran Out of Food in the Last Year: Sometimes true  Transportation Needs: Unmet Transportation Needs (03/12/2022)   PRAPARE - TrHydrologistMedical): Yes    Lack of Transportation (Non-Medical): Yes  Physical Activity: Not on file  Stress: Not on file  Social Connections: Not on file  Intimate Partner Violence: Not on file    Allergies  Allergen Reactions   Pork-Derived Products     No current facility-administered medications on file prior to encounter.   Current Outpatient Medications on File Prior to Encounter  Medication Sig Dispense Refill   aspirin (ASPIRIN LOW DOSE) 81 MG chewable tablet CHEW 1 TABLET BY MOUTH DAILY. 90 tablet 1   insulin aspart (NOVOLOG FLEXPEN) 100 UNIT/ML FlexPen Inject 3  units subcutaneously with meals. 30 mL 11   labetalol (NORMODYNE) 200 MG tablet Take 2 tablets (400 mg total) by mouth 3 (three) times daily. 180 tablet 3   metoCLOPramide (REGLAN) 10 MG tablet Take 1 tablet (10 mg total) by mouth 3 (three) times daily before meals. 90 tablet 3   NIFEdipine (PROCARDIA XL/NIFEDICAL-XL) 90 MG 24 hr tablet Take 1 tablet (90 mg total) by mouth daily. 30 tablet 2   ondansetron (ZOFRAN-ODT) 4 MG disintegrating tablet Take 1 tablet (4 mg total) by mouth every 6 (six) hours as needed for nausea. 30 tablet 2   pantoprazole (PROTONIX) 20 MG tablet Take 2 tablets (40 mg total) by mouth daily. 30 tablet 5   Prenatal Vit-Fe Fumarate-FA (PRENATAL VITAMIN) 27-0.8 MG TABS Take 1  tablet by mouth daily. 90 tablet 2   Accu-Chek Softclix Lancets lancets Use as instructed; check blood glucose 4 times daily 200 each 10   acetaminophen (TYLENOL) 500 MG tablet Take 1,000 mg by mouth every 6 (six) hours as needed.     Blood Glucose Monitoring Suppl (ACCU-CHEK GUIDE) w/Device KIT use as directed 4 (four) times daily. 1 kit 0   Continuous Blood Gluc Sensor (DEXCOM G6 SENSOR) MISC 1 Device by Does not apply route as directed. Every 10 days 9 each 3   Continuous Blood Gluc Transmit (DEXCOM G6 TRANSMITTER) MISC 1 Device by Does not apply route every 3 (three) months. 3 each 1   glucose blood (ACCU-CHEK GUIDE) test strip Use as instructed; check blood glucose 4 times daily 200 each 10   Insulin Disposable Pump (OMNIPOD 5 G6 INTRO, GEN 5,) KIT 1 Device by Does not apply route every 3 (three) days. 1 kit 0   Insulin Disposable Pump (OMNIPOD 5 G6 POD, GEN 5,) MISC 1 Device by Does not apply route every 3 (three) days. 6 each 3   insulin glargine (LANTUS) 100 UNIT/ML Solostar Pen Inject 22 units subcutaneously nightly. (Patient taking differently: Inject 25 in the morning 20 units subcutaneously nightly.) 15 mL 6   Insulin Pen Needle 32G X 4 MM MISC 1 Device by Does not apply route in the morning, at noon, in the evening, and at bedtime. 200 each 11   [DISCONTINUED] insulin detemir (LEVEMIR) 100 UNIT/ML injection Inject 20-40 Units into the skin See admin instructions. 40 units every morning and 20 units every night (Patient not taking: Reported on 04/11/2021)       ROS Pertinent positives and negative per HPI, all others reviewed and negative  Physical Exam   BP (!) 153/93 (BP Location: Right Arm)   Pulse (!) 107   Temp 97.7 F (36.5 C) (Oral)   Resp 18   Wt 50.3 kg   LMP 09/20/2021   SpO2 100%   BMI 20.27 kg/m   Patient Vitals for the past 24 hrs:  BP Temp Temp src Pulse Resp SpO2 Weight  04/30/22 1219 (!) 153/93 97.7 F (36.5 C) Oral (!) 107 18 100 % --  04/30/22 1210 --  -- -- -- -- -- 50.3 kg    Physical Exam ***  Cervical Exam    Bedside Ultrasound ***  My interpretation: ***  FHT Baseline ***, *** variability, ***accels, ***decels Toco: *** Cat: ***  Labs No results found for this or any previous visit (from the past 24 hour(s)).  Imaging No results found.  MAU Course  Procedures  Lab Orders         Urinalysis, Routine w reflex microscopic Urine, Clean Catch  No orders of the defined types were placed in this encounter.  Imaging Orders  No imaging studies ordered today    MDM {Desc; none/mild/moderate/severe:13498}  Assessment and Plan  ***  #FWB FHT Cat *** NST: ***   Dispo: discharged to home in stable condition.***  ***add GA to billing    Clarnce Flock, MD/MPH 04/30/22 1:08 PM  Allergies as of 04/30/2022       Reactions   Pork-derived Products      Med Rec must be completed prior to using this [place]***

## 2022-04-30 NOTE — MAU Provider Note (Signed)
History     CSN: [id number]  Arrival date and time: 04/30/22 1158   Event Date/Time   First Provider Initiated Contact with Patient 04/30/22 1312      Chief Complaint  Patient presents with   Back Pain   Emesis   Contractions   Kristin Clay is a 36 yo G3P1 at 37w6dwith a hx of poorly-controlled T1DM and chronic HTN presents for nausea/vomiting and back pain. She also complains of abdominal pain/contractions, headache, SOB, chest pain, fatigue, and decreased fetal movement over the last day. She also reports having blurry vision 3 days ago, but is now resolved. Patient says that the symptoms started 2-3 days ago and she has vomited 7 times this morning. She has not been able to tolerate any food today. She reports taking Lantus but has not taken her Novolog in 3 days. She reports her pre-prandial sugars are usually around 130-135 at home. She denies LOF or vaginal bleeding.  She has been admitted for DKA x3 in the last 6 months with a similar presentation. She had an endocrinology appointment 2 days ago and they refilled her Dexcom but did not make any other adjustments.    Back Pain Associated symptoms include chest pain and weakness.  Emesis  Associated symptoms include chest pain.    OB History     Gravida  3   Para  1   Term  0   Preterm  1   AB  1   Living  1      SAB  1   IAB  0   Ectopic  0   Multiple  0   Live Births  1           Past Medical History:  Diagnosis Date   Chronic hypertension    Complication of anesthesia    with hand surgery, local anes did not work, tried twice- had to put her to sleep   Diabetic retinopathy ([facility]    [place] (di[place]acidosis) [place] 10[place]2020   Epidural hematoma ([place] 07/04/2021   Obstetric vaginal laceration with type 3c third degree perineal laceration 06/29/2021   Pyelonephritis 10/2020   Rh negative status during pregnancy 05/01/2021   s/p Rhogam 05/13/21   Type I diabetes mellitus ([place]    dx 185yrago   UTI  (urinary tract infection)     Past Surgical History:  Procedure Laterality Date   female circumcision Bilateral    clitorectomy as well   FOOT SURGERY  2018   HAND SURGERY  2019    Family History  Problem Relation Age of Onset   Hyperlipidemia Mother    Hypertension Mother    Kidney disease Father    Alzheimer's disease Father     Social History   Tobacco Use   Smoking status: Never   Smokeless tobacco: Never  Vaping Use   Vaping Use: Never used  Substance Use Topics   Alcohol use: Never   Drug use: Never    Allergies:  Allergies  Allergen Reactions   Pork-Derived Products     Medications Prior to Admission  Medication Sig Dispense Refill Last Dose   aspirin (ASPIRIN LOW DOSE) 81 MG chewable tablet CHEW 1 TABLET BY MOUTH DAILY. 90 tablet 1 04/29/2022   insulin aspart (NOVOLOG FLEXPEN) 100 UNIT/ML FlexPen Inject 3 units subcutaneously with meals. 30 mL 11 04/29/2022   labetalol (NORMODYNE) 200 MG tablet Take 2 tablets (400 mg total) by mouth 3 (three) times daily. 180 tablet 3 04/30/2022  metoCLOPramide (REGLAN) 10 MG tablet Take 1 tablet (10 mg total) by mouth 3 (three) times daily before meals. 90 tablet 3 04/30/2022   NIFEdipine (PROCARDIA XL/NIFEDICAL-XL) 90 MG 24 hr tablet Take 1 tablet (90 mg total) by mouth daily. 30 tablet 2 04/30/2022   ondansetron (ZOFRAN-ODT) 4 MG disintegrating tablet Take 1 tablet (4 mg total) by mouth every 6 (six) hours as needed for nausea. 30 tablet 2 04/30/2022   pantoprazole (PROTONIX) 20 MG tablet Take 2 tablets (40 mg total) by mouth daily. 30 tablet 5 04/30/2022   Prenatal Vit-Fe Fumarate-FA (PRENATAL VITAMIN) 27-0.8 MG TABS Take 1 tablet by mouth daily. 90 tablet 2 04/29/2022   Accu-Chek Softclix Lancets lancets Use as instructed; check blood glucose 4 times daily 200 each 10    acetaminophen (TYLENOL) 500 MG tablet Take 1,000 mg by mouth every 6 (six) hours as needed.    at 0830   Blood Glucose Monitoring Suppl (ACCU-CHEK GUIDE) w/Device KIT  use as directed 4 (four) times daily. 1 kit 0    Continuous Blood Gluc Sensor (DEXCOM G6 SENSOR) MISC 1 Device by Does not apply route as directed. Every 10 days 9 each 3    Continuous Blood Gluc Transmit (DEXCOM G6 TRANSMITTER) MISC 1 Device by Does not apply route every 3 (three) months. 3 each 1    glucose blood (ACCU-CHEK GUIDE) test strip Use as instructed; check blood glucose 4 times daily 200 each 10    Insulin Disposable Pump (OMNIPOD 5 G6 INTRO, GEN 5,) KIT 1 Device by Does not apply route every 3 (three) days. 1 kit 0    Insulin Disposable Pump (OMNIPOD 5 G6 POD, GEN 5,) MISC 1 Device by Does not apply route every 3 (three) days. 6 each 3    insulin glargine (LANTUS) 100 UNIT/ML Solostar Pen Inject 22 units subcutaneously nightly. (Patient taking differently: Inject 25 in the morning 20 units subcutaneously nightly.) 15 mL 6    Insulin Pen Needle 32G X 4 MM MISC 1 Device by Does not apply route in the morning, at noon, in the evening, and at bedtime. 200 each 11     Review of Systems  Constitutional:  Positive for appetite change.  Cardiovascular:  Positive for chest pain.  Gastrointestinal:  Positive for nausea and vomiting.  Musculoskeletal:  Positive for back pain.  Neurological:  Positive for weakness.   Physical Exam   Blood pressure (!) 153/93, pulse (!) 107, temperature 97.7 F (36.5 C), temperature source Oral, resp. rate 18, weight 50.3 kg, last menstrual period 09/20/2021, SpO2 100 %, not currently breastfeeding.  Physical Exam Vitals and nursing note reviewed. Exam conducted with a chaperone present.  Constitutional:      Appearance: She is ill-appearing.  HENT:     Head: Normocephalic and atraumatic.  Cardiovascular:     Rate and Rhythm: Regular rhythm.     Heart sounds: Normal heart sounds. No murmur heard.    No friction rub. No gallop.  Pulmonary:     Effort: Pulmonary effort is normal.  Abdominal:     Tenderness: There is abdominal tenderness.   Musculoskeletal:        General: Tenderness present.     Comments: Tenderness to palpation in her back  Neurological:     Mental Status: She is alert and oriented to person, place, and time.   FHRT Baseline 150 Moderate variability +accels (10x10, appropriate for gestational age) No decels Toco initially q4 min, subsequently flat Cat I  MAU Course  Procedures  MDM DKA labs: -CBC - Hgb 15.9 -CMP  - Na 136/K 3.7/Cl 101; Glucose 101; Anion gap 16. -Betahydroxybutyric acid - 4.43  -Gave LR bolus x1, cyclobenzaprine, and zofran for symptomatic relief  -Also given home dose of Procardia 90 XL which she vomited immediately after taking this morning -Blood pressures trended down 153/93 >> 119/75 during MAU course  -1524: On reassessment she is feeling better with nausea/vomiting and intensity of contractions, but still complaining of back pain. Giving procardia as she did not tolerate her morning dose due to vomiting. Will reassess.   -17:30: Patient reports feeling better, is resting comfortably in the room. Waiting to finish fluid bolus to redraw labs.     Assessment and Plan   Type1 Diabetes Mellitis in Pregnancy Chronic Hypertension in pregnancy [redacted] weeks gestation of pregnancy  On initial arrival patient was ill appearing and reporting symptoms similar to prior episodes of DKA. However lab results not totally consistent as patient was euglycemic, anion gap only barely over upper limit of normal and CO2 not significantly decreased. Beta-hydroxybutyric acid also elevated but patient has not eaten well for several days. Discussed with diabetes RN, initial recommendation for insulin gtt and admission however I was hopeful that we could improve her metabolic state with fluids and symptomatic meds. In addition she was having regular contractions that I suspected may be related to her symptoms and were being caused by combination of dehydration and lack of procardia. Subsequently gave  her fluids, symptomatic meds, and her home procardia and rechecked labs. After all of this she reported massive improvement in her symptoms. In addition, her Beta-hydroxybutyric acid was falling, her Cr was improved and her anion gap resolved. Her CO2 had dropped from 19>16, however this may have been related to being volume resuscitated with 2L LR. Tbili which had been borderline elevated was also downtrending, also suspect this was related to volume depletion/lack of PO. In addition her contractions resolved completely and FHT was Cat I throughout stay in MAU. Patient was then given a PO challenge and able to pass. Overall I felt patient was stable for discharge to home with close follow up. I stressed importance of regular dietary intake as well as taking her insulin and other medications regularly. I coordinated for her to have a follow up CMP drawn the following day at [facility] and I will follow up that result once available.   Kristin Clay 04/30/2022, 1:20 PM    ATTENDING ATTESTATION  I have seen and examined this patient and agree with the above documentation in the medical student's note except as below.  I have edited the above note for clarity and accuracy.  Kristin Clay, [facility] [employer] (Faculty Practice) 04/30/2022, 9:01 PM

## 2022-05-01 ENCOUNTER — Other Ambulatory Visit: Payer: Medicaid Other

## 2022-05-01 ENCOUNTER — Telehealth: Payer: Self-pay

## 2022-05-01 DIAGNOSIS — E1010 Type 1 diabetes mellitus with ketoacidosis without coma: Secondary | ICD-10-CM | POA: Diagnosis not present

## 2022-05-01 NOTE — Telephone Encounter (Signed)
-----   Message from Venora Maples, MD sent at 04/30/2022  8:52 PM EDT ----- Regarding: Lab visit tomorrow Please put patient on books for a lab visit for a CMP only tomorrow. Seen in MAU today, trying to keep her out of the hospital if possible just needed close follow up.  [employer]

## 2022-05-01 NOTE — Telephone Encounter (Signed)
Patient has lab visit scheduled for today 05/01/22 at 11 AM. Attempted to call patient with the help of [employer] interpreter Ahmed ID [phone] to make sure patient is aware of this appointment. Voicemail left for patient requesting her to call office back or check mychart for appointment info.   Paulina Fusi, RN 05/01/22

## 2022-05-02 LAB — COMPREHENSIVE METABOLIC PANEL
ALT: 8 IU/L (ref 0–32)
AST: 10 IU/L (ref 0–40)
Albumin/Globulin Ratio: 1.2 (ref 1.2–2.2)
Albumin: 3.4 g/dL — ABNORMAL LOW (ref 3.8–4.8)
Alkaline Phosphatase: 85 IU/L (ref 44–121)
BUN/Creatinine Ratio: 15 (ref 9–23)
BUN: 8 mg/dL (ref 6–20)
Bilirubin Total: 0.4 mg/dL (ref 0.0–1.2)
CO2: 16 mmol/L — ABNORMAL LOW (ref 20–29)
Calcium: 8.3 mg/dL — ABNORMAL LOW (ref 8.7–10.2)
Chloride: 104 mmol/L (ref 96–106)
Creatinine, Ser: 0.55 mg/dL — ABNORMAL LOW (ref 0.57–1.00)
Globulin, Total: 2.8 g/dL (ref 1.5–4.5)
Glucose: 229 mg/dL — ABNORMAL HIGH (ref 70–99)
Potassium: 3.6 mmol/L (ref 3.5–5.2)
Sodium: 137 mmol/L (ref 134–144)
Total Protein: 6.2 g/dL (ref 6.0–8.5)
eGFR: 122 mL/min/{1.73_m2} (ref >59)

## 2022-05-04 ENCOUNTER — Ambulatory Visit (INDEPENDENT_AMBULATORY_CARE_PROVIDER_SITE_OTHER): Payer: Medicaid Other | Admitting: Obstetrics and Gynecology

## 2022-05-04 VITALS — BP 138/97 | HR 94 | Wt 126.2 lb

## 2022-05-04 DIAGNOSIS — O10919 Unspecified pre-existing hypertension complicating pregnancy, unspecified trimester: Secondary | ICD-10-CM

## 2022-05-04 DIAGNOSIS — O09219 Supervision of pregnancy with history of pre-term labor, unspecified trimester: Secondary | ICD-10-CM

## 2022-05-04 DIAGNOSIS — O26899 Other specified pregnancy related conditions, unspecified trimester: Secondary | ICD-10-CM

## 2022-05-04 DIAGNOSIS — Z789 Other specified health status: Secondary | ICD-10-CM

## 2022-05-04 DIAGNOSIS — Z3A31 31 weeks gestation of pregnancy: Secondary | ICD-10-CM

## 2022-05-04 DIAGNOSIS — O24013 Pre-existing diabetes mellitus, type 1, in pregnancy, third trimester: Secondary | ICD-10-CM

## 2022-05-04 DIAGNOSIS — Z6791 Unspecified blood type, Rh negative: Secondary | ICD-10-CM

## 2022-05-04 DIAGNOSIS — O0990 Supervision of high risk pregnancy, unspecified, unspecified trimester: Secondary | ICD-10-CM

## 2022-05-04 NOTE — Patient Instructions (Addendum)
[place]  [address] [place], [place] [address] [id number]  June 22nd at 915am

## 2022-05-04 NOTE — Progress Notes (Signed)
PRENATAL VISIT NOTE  Subjective:  Kristin Clay is a 36 y.o. [phone] at [redacted]w[redacted]d being seen today for ongoing prenatal care.  She is currently monitored for the following issues for this high-risk pregnancy and has Female circumcision; Language barrier; Poor compliance; Hypertension; Supervision of high risk pregnancy, antepartum; Rh negative, antepartum; Type 1 diabetes mellitus affecting pregnancy in third trimester, antepartum; Chronic hypertension affecting pregnancy; Short interval between pregnancies affecting pregnancy, antepartum; History of severe preeclampsia, prior pregnancy, currently pregnant; Previous preterm delivery at 33w6 due to severe preeclampsia, antepartum; History of forceps delivery in prior pregnancy, currently pregnant; Proteinuria affecting pregnancy; Atypical chest pain; Blurry vision; DM type 1 causing eye disease ([place]); Shortness of breath; Alpha thalassemia silent carrier; DKA, type 1 ([place]); Hypokalemia; Red Chart Rounds; Hypomagnesemia; 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  Patient reports no complaints.  Contractions: Not present. Vag. Bleeding: None.  Movement: Present. Denies leaking of fluid.   The following portions of the patient's history were reviewed and updated as appropriate: allergies, current medications, past family history, past medical history, past social history, past surgical history and problem list.   Objective:   Vitals:   05/04/22 0934 05/04/22 0941  BP: (!) 142/71 (!) 138/97  Pulse: 94 94  Weight: 126 lb 3.2 oz (57.2 kg)     Fetal Status: Fetal Heart Rate (bpm): 148   Movement: Present     General:  Alert, oriented and cooperative. Patient is in no acute distress.  Skin: Skin is warm and dry. No rash noted.    Cardiovascular: Normal heart rate noted  Respiratory: Normal respiratory effort, no problems with respiration noted  Abdomen: Soft, gravid, appropriate for gestational age.  Pain/Pressure: Present     Pelvic: Cervical exam deferred        Extremities: Normal range of motion.  Edema: None  Mental Status: Normal mood and affect. Normal behavior. Normal judgment and thought content.   Assessment and Plan:  Pregnancy: [id number] at [redacted]w[redacted]d 1. Supervision of high risk pregnancy, antepartum Routine care. Declines BTL. Interested in something for birth control but unsure what exactly. Can d/w pt more next visit  5/23: 1179gm, efw 22%, ac 37%, afi 13>>follow up repeat on 6/23.   Consider delivery at 37wks.   2. [redacted] weeks gestation of pregnancy  3. Chronic hypertension affecting pregnancy Doing well on procardia 90 qday and labetalol 400 tid with low dose ASA  4. Type 1 diabetes mellitus affecting pregnancy in third trimester, antepartum Pt states CBGs are 90s in the morning and near 120 after meals. She states she is on the same regimen as when she was last seen a few days ago in the MAU which is lantus 25/30 with novolog 15 tidac and novolog for postprandial correction. Pt also followed by [facility]  Confirmed with patient her 6/22 peds cards fetal echo  Followed by Optho  5. Rh negative, antepartum S/p rhogam   6. Previous preterm delivery at 33w6 due to severe preeclampsia, antepartum  7. Language barrier Interpreter used  Preterm labor symptoms and general obstetric precautions including but not limited to vaginal bleeding, contractions, leaking of fluid and fetal movement were reviewed in detail with the patient. Please refer to After Visit Summary for other counseling recommendations.   No follow-ups on file.  Future Appointments  Date Time Provider  [place]  05/08/2022 10:15 AM [facility] NURSE [facility]  05/08/2022 10:30 AM [facility] US2 [facility]  05/15/2022  10:30 AM [facility] NURSE [facility]  05/15/2022 10:45 AM [facility] US6 [facility]  05/18/2022  9:15 AM Aletha Halim, MD [facility]  05/22/2022  9:45 AM [facility] US5 [facility]  05/22/2022  3:00 PM Shamleffer, Melanie Crazier, MD [facility] None  05/25/2022  9:30 AM Bernarda Caffey, MD [facility] None  05/29/2022 11:00 AM [facility] US1 [facility] [place]  08/28/2022  1:20 PM Tobb, Godfrey Pick, DO [facility] None    Aletha Halim, MD

## 2022-05-05 ENCOUNTER — Inpatient Hospital Stay (HOSPITAL_COMMUNITY)
Admission: AD | Admit: 2022-05-05 | Discharge: 2022-05-19 | DRG: 786 | Disposition: A | Discharge status: Home / Self Care | Payer: Medicaid Other | Attending: Family Medicine | Admitting: Family Medicine

## 2022-05-05 DIAGNOSIS — O09299 Supervision of pregnancy with other poor reproductive or obstetric history, unspecified trimester: Secondary | ICD-10-CM

## 2022-05-05 DIAGNOSIS — O42119 Preterm premature rupture of membranes, onset of labor more than 24 hours following rupture, unspecified trimester: Secondary | ICD-10-CM | POA: Diagnosis not present

## 2022-05-05 DIAGNOSIS — O10013 Pre-existing essential hypertension complicating pregnancy, third trimester: Secondary | ICD-10-CM | POA: Diagnosis not present

## 2022-05-05 DIAGNOSIS — Z3A33 33 weeks gestation of pregnancy: Secondary | ICD-10-CM | POA: Diagnosis not present

## 2022-05-05 DIAGNOSIS — O09899 Supervision of other high risk pregnancies, unspecified trimester: Secondary | ICD-10-CM

## 2022-05-05 DIAGNOSIS — E10649 Type 1 diabetes mellitus with hypoglycemia without coma: Secondary | ICD-10-CM | POA: Diagnosis present

## 2022-05-05 DIAGNOSIS — T68XXXA Hypothermia, initial encounter: Secondary | ICD-10-CM | POA: Diagnosis not present

## 2022-05-05 DIAGNOSIS — O3473 Maternal care for abnormality of vulva and perineum, third trimester: Secondary | ICD-10-CM | POA: Diagnosis not present

## 2022-05-05 DIAGNOSIS — O09219 Supervision of pregnancy with history of pre-term labor, unspecified trimester: Secondary | ICD-10-CM | POA: Diagnosis not present

## 2022-05-05 DIAGNOSIS — M542 Cervicalgia: Secondary | ICD-10-CM | POA: Diagnosis not present

## 2022-05-05 DIAGNOSIS — O42013 Preterm premature rupture of membranes, onset of labor within 24 hours of rupture, third trimester: Secondary | ICD-10-CM | POA: Diagnosis not present

## 2022-05-05 DIAGNOSIS — Z3A32 32 weeks gestation of pregnancy: Secondary | ICD-10-CM | POA: Diagnosis not present

## 2022-05-05 DIAGNOSIS — O1002 Pre-existing essential hypertension complicating childbirth: Secondary | ICD-10-CM | POA: Diagnosis present

## 2022-05-05 DIAGNOSIS — O3463 Maternal care for abnormality of vagina, third trimester: Secondary | ICD-10-CM | POA: Diagnosis not present

## 2022-05-05 DIAGNOSIS — O411230 Chorioamnionitis, third trimester, not applicable or unspecified: Secondary | ICD-10-CM | POA: Diagnosis present

## 2022-05-05 DIAGNOSIS — Z6791 Unspecified blood type, Rh negative: Secondary | ICD-10-CM | POA: Diagnosis not present

## 2022-05-05 DIAGNOSIS — O339 Maternal care for disproportion, unspecified: Secondary | ICD-10-CM | POA: Diagnosis not present

## 2022-05-05 DIAGNOSIS — O42113 Preterm premature rupture of membranes, onset of labor more than 24 hours following rupture, third trimester: Secondary | ICD-10-CM | POA: Diagnosis not present

## 2022-05-05 DIAGNOSIS — E1010 Type 1 diabetes mellitus with ketoacidosis without coma: Secondary | ICD-10-CM | POA: Diagnosis present

## 2022-05-05 DIAGNOSIS — N90813 Female genital mutilation Type III status: Secondary | ICD-10-CM | POA: Diagnosis present

## 2022-05-05 DIAGNOSIS — O42919 Preterm premature rupture of membranes, unspecified as to length of time between rupture and onset of labor, unspecified trimester: Secondary | ICD-10-CM | POA: Diagnosis present

## 2022-05-05 DIAGNOSIS — A419 Sepsis, unspecified organism: Secondary | ICD-10-CM | POA: Diagnosis not present

## 2022-05-05 DIAGNOSIS — O2402 Pre-existing diabetes mellitus, type 1, in childbirth: Secondary | ICD-10-CM | POA: Diagnosis not present

## 2022-05-05 DIAGNOSIS — O24424 Gestational diabetes mellitus in childbirth, insulin controlled: Secondary | ICD-10-CM | POA: Diagnosis not present

## 2022-05-05 DIAGNOSIS — N90810 Female genital mutilation status, unspecified: Secondary | ICD-10-CM | POA: Diagnosis present

## 2022-05-05 DIAGNOSIS — O26893 Other specified pregnancy related conditions, third trimester: Secondary | ICD-10-CM | POA: Diagnosis present

## 2022-05-05 DIAGNOSIS — O10919 Unspecified pre-existing hypertension complicating pregnancy, unspecified trimester: Secondary | ICD-10-CM | POA: Diagnosis present

## 2022-05-05 DIAGNOSIS — O2412 Pre-existing diabetes mellitus, type 2, in childbirth: Secondary | ICD-10-CM | POA: Diagnosis not present

## 2022-05-05 DIAGNOSIS — Z794 Long term (current) use of insulin: Secondary | ICD-10-CM | POA: Diagnosis not present

## 2022-05-05 DIAGNOSIS — Z3A31 31 weeks gestation of pregnancy: Secondary | ICD-10-CM | POA: Diagnosis not present

## 2022-05-05 DIAGNOSIS — O42913 Preterm premature rupture of membranes, unspecified as to length of time between rupture and onset of labor, third trimester: Secondary | ICD-10-CM | POA: Diagnosis not present

## 2022-05-05 DIAGNOSIS — O114 Pre-existing hypertension with pre-eclampsia, complicating childbirth: Secondary | ICD-10-CM | POA: Diagnosis not present

## 2022-05-05 DIAGNOSIS — E1042 Type 1 diabetes mellitus with diabetic polyneuropathy: Secondary | ICD-10-CM

## 2022-05-05 DIAGNOSIS — Z603 Acculturation difficulty: Secondary | ICD-10-CM | POA: Diagnosis present

## 2022-05-05 DIAGNOSIS — O09293 Supervision of pregnancy with other poor reproductive or obstetric history, third trimester: Secondary | ICD-10-CM | POA: Diagnosis not present

## 2022-05-05 DIAGNOSIS — O99893 Other specified diseases and conditions complicating puerperium: Secondary | ICD-10-CM | POA: Diagnosis not present

## 2022-05-05 DIAGNOSIS — R32 Unspecified urinary incontinence: Secondary | ICD-10-CM | POA: Diagnosis not present

## 2022-05-05 DIAGNOSIS — Z789 Other specified health status: Secondary | ICD-10-CM | POA: Diagnosis present

## 2022-05-05 DIAGNOSIS — O0990 Supervision of high risk pregnancy, unspecified, unspecified trimester: Secondary | ICD-10-CM

## 2022-05-05 DIAGNOSIS — O26899 Other specified pregnancy related conditions, unspecified trimester: Secondary | ICD-10-CM

## 2022-05-05 DIAGNOSIS — D563 Thalassemia minor: Secondary | ICD-10-CM | POA: Diagnosis not present

## 2022-05-05 DIAGNOSIS — O24012 Pre-existing diabetes mellitus, type 1, in pregnancy, second trimester: Secondary | ICD-10-CM

## 2022-05-05 MED ORDER — DEXTROSE IN LACTATED RINGERS 5 % IV SOLN: INTRAVENOUS | Status: DC

## 2022-05-05 MED ORDER — SODIUM CHLORIDE 0.9 % IV SOLN
2.0000 g | Freq: Four times a day (QID) | INTRAVENOUS | Status: AC
  Administered 2022-05-06 – 2022-05-07 (×8): 2 g via INTRAVENOUS
  Filled 2022-05-05 (×8): qty 2000

## 2022-05-05 MED ORDER — PENICILLIN G POT IN DEXTROSE 60000 UNIT/ML IV SOLN: 3.0000 10*6.[IU] | INTRAVENOUS | Status: DC

## 2022-05-05 MED ORDER — ACETAMINOPHEN 325 MG PO TABS
650.0000 mg | ORAL_TABLET | ORAL | Status: DC | PRN
  Administered 2022-05-06: 650 mg via ORAL
  Filled 2022-05-05: qty 2

## 2022-05-05 MED ORDER — OXYCODONE-ACETAMINOPHEN 5-325 MG PO TABS: 2.0000 | ORAL_TABLET | ORAL | Status: DC | PRN

## 2022-05-05 MED ORDER — OXYTOCIN-SODIUM CHLORIDE 30-0.9 UT/500ML-% IV SOLN: 2.5000 [IU]/h | INTRAVENOUS | Status: DC

## 2022-05-05 MED ORDER — SOD CITRATE-CITRIC ACID 500-334 MG/5ML PO SOLN: 30.0000 mL | ORAL | Status: DC | PRN

## 2022-05-05 MED ORDER — OXYTOCIN BOLUS FROM INFUSION: 333.0000 mL | Freq: Once | INTRAVENOUS | Status: DC

## 2022-05-05 MED ORDER — LACTATED RINGERS IV SOLN: INTRAVENOUS | Status: DC

## 2022-05-05 MED ORDER — INSULIN GLARGINE-YFGN 100 UNIT/ML ~~LOC~~ SOLN: 15.0000 [IU] | Freq: Every day | SUBCUTANEOUS | Status: DC

## 2022-05-05 MED ORDER — LIDOCAINE HCL (PF) 1 % IJ SOLN: 30.0000 mL | INTRAMUSCULAR | Status: DC | PRN

## 2022-05-05 MED ORDER — INSULIN ASPART 100 UNIT/ML FLEXPEN: 3.0000 [IU] | PEN_INJECTOR | Freq: Three times a day (TID) | SUBCUTANEOUS | Status: DC

## 2022-05-05 MED ORDER — OXYCODONE-ACETAMINOPHEN 5-325 MG PO TABS: 1.0000 | ORAL_TABLET | ORAL | Status: DC | PRN

## 2022-05-05 MED ORDER — AMOXICILLIN 500 MG PO CAPS
500.0000 mg | ORAL_CAPSULE | Freq: Three times a day (TID) | ORAL | Status: AC
  Administered 2022-05-07 – 2022-05-12 (×15): 500 mg via ORAL
  Filled 2022-05-05 (×16): qty 1

## 2022-05-05 MED ORDER — INSULIN REGULAR(HUMAN) IN NACL 100-0.9 UT/100ML-% IV SOLN
INTRAVENOUS | Status: DC
  Administered 2022-05-06: 0.9 [IU]/h via INTRAVENOUS
  Administered 2022-05-06 – 2022-05-07 (×2): 9 [IU]/h via INTRAVENOUS
  Administered 2022-05-07: 4.4 [IU]/h via INTRAVENOUS
  Administered 2022-05-07: 11 [IU]/h via INTRAVENOUS
  Administered 2022-05-08: 1.6 [IU]/h via INTRAVENOUS
  Filled 2022-05-05 (×4): qty 100

## 2022-05-05 MED ORDER — NIFEDIPINE ER OSMOTIC RELEASE 60 MG PO TB24
90.0000 mg | ORAL_TABLET | Freq: Every day | ORAL | Status: DC
  Filled 2022-05-05 (×2): qty 1

## 2022-05-05 MED ORDER — LACTATED RINGERS IV SOLN
500.0000 mL | INTRAVENOUS | Status: DC | PRN
  Administered 2022-05-06: 500 mL via INTRAVENOUS

## 2022-05-05 MED ORDER — ONDANSETRON HCL 4 MG/2ML IJ SOLN
4.0000 mg | Freq: Four times a day (QID) | INTRAMUSCULAR | Status: DC | PRN
  Administered 2022-05-07 – 2022-05-08 (×2): 4 mg via INTRAVENOUS
  Filled 2022-05-05 (×2): qty 2

## 2022-05-05 MED ORDER — SODIUM CHLORIDE 0.9 % IV SOLN: 5.0000 10*6.[IU] | Freq: Once | INTRAVENOUS | Status: DC

## 2022-05-05 MED ORDER — AZITHROMYCIN 500 MG PO TABS
1000.0000 mg | ORAL_TABLET | Freq: Once | ORAL | Status: AC
  Administered 2022-05-06: 1000 mg via ORAL
  Filled 2022-05-05: qty 2

## 2022-05-05 MED ORDER — DEXTROSE 50 % IV SOLN: 0.0000 mL | INTRAVENOUS | Status: DC | PRN

## 2022-05-05 MED ORDER — LABETALOL HCL 200 MG PO TABS
400.0000 mg | ORAL_TABLET | Freq: Three times a day (TID) | ORAL | Status: DC
Start: 2022-05-06 — End: 2022-05-10
  Administered 2022-05-06 – 2022-05-10 (×11): 400 mg via ORAL
  Filled 2022-05-05 (×13): qty 2

## 2022-05-05 NOTE — H&P (Incomplete)
Kristin Clay is a 36 y.o. female [phone] with IUP at 93w4dby early [place] for PPROM and contractions. She reports +FMs,  no VB, no blurry vision, headaches or peripheral edema, and RUQ pain.  She reports she started having contractions at *** her water broke at ***.  Of note, patient also with history of cHTN (on Labetalol and Procardia), T1DM, and history of preterm delivery at 33 weeks in setting of severe pre-eclampsia  She plans on formula feeding. She is unsure regarding*** birth control. [facility] [place]--->  Estimated Date of Delivery: 07/03/22  Sono:   @***w***d, CWD, normal anatomy, *** presentation, *** lie, ***g, ***% EFW   Prenatal History/Complications: *** [id number] insulin cHTN on meds History of severe pre-eclampsia with prior pregnancy Rh negative Prior 33 week delivery in setting of severe pre-eclampsia Short interval pregnancy GBS unknown  Past Medical History: Past Medical History:  Diagnosis Date  . Chronic hypertension   . Complication of anesthesia    with hand surgery, local anes did not work, tried twice- had to put her to sleep  . [place]abetic retinopathy ([place]   . DKA (diabetic ketoacidosis) ([facility] 10/2020  . Epidural hematoma ([facility] 07/04/2021  . Obstetric vaginal laceration with type 3c third degree perineal laceration 06/29/2021  . Pyelonephritis 10/2020  . Rh negative status during pregnancy 05/01/2021   s/p Rhogam 05/13/21  . Type I diabetes mellitus ([facility]    dx 127yrago  . UTI (urinary tract infection)     Past Surgical History: Past Surgical History:  Procedure Laterality Date  . female circumcision Bilateral    clitorectomy as well  . FOOT SURGERY  2018  . HAND SURGERY  2019    Obstetrical History: OB History     Gravida  3   Para  1   Term  0   Preterm  1   AB  1   Living  1      SAB  1   IAB  0   Ectopic  0    Multiple  0   Live Births  1           Social History Social History   Socioeconomic History  . Marital status: Married    Spouse name: Not on file  . Number of children: Not on file  . Years of education: Not on file  . Highest education level: Not on file  Occupational History  . Not on file  Tobacco Use  . Smoking status: Never  . Smokeless tobacco: Never  Vaping Use  . Vaping Use: Never used  Substance and Sexual Activity  . Alcohol use: Never  . Drug use: Never  . Sexual activity: Not Currently    Birth control/protection: None  Other Topics Concern  . Not on file  Social History Narrative   ** Merged History Encounter **       ** Merged History Encounter **       Social Determinants of Health   Financial Resource Strain: High Risk (05/30/2021)   Overall Financial Resource Strain (CARDIA)   . Difficulty of Paying Living Expenses: Very hard  Food Insecurity: Food Insecurity Present (03/12/2022)   Hunger Vital Sign   . Worried About RuCharity fundraisern the Last Year: Sometimes true   . Ran Out of Food in the Last Year: Sometimes true  Transportation Needs: Unmet Transportation Needs (03/12/2022)   PRAPARE - Transportation   . Lack of Transportation (Medical): Yes   . Lack of Transportation (Non-Medical): Yes  Physical Activity: Not on file  Stress: Not on file  Social Connections: Not on file    Family History: Family History  Problem Relation Age of Onset  . Hyperlipidemia Mother   . Hypertension Mother   . Kidney disease Father   . Alzheimer's disease Father     Allergies: Allergies  Allergen Reactions  . Pork-Derived Products     Medications Prior to Admission  Medication Sig Dispense Refill Last Dose  . Accu-Chek Softclix Lancets lancets Use as instructed; check blood glucose 4 times daily 200 each 10 05/05/2022  . aspirin (ASPIRIN LOW DOSE) 81 MG chewable tablet CHEW 1 TABLET BY MOUTH DAILY. 90 tablet 1 05/05/2022  . Blood Glucose  Monitoring Suppl (ACCU-CHEK GUIDE) w/Device KIT use as directed 4 (four) times daily. 1 kit 0 05/05/2022  . glucose blood (ACCU-CHEK GUIDE) test strip Use as instructed; check blood glucose 4 times daily 200 each 10 05/05/2022  . insulin aspart (NOVOLOG FLEXPEN) 100 UNIT/ML FlexPen Inject 3 units subcutaneously with meals. 30 mL 11 05/05/2022  . Insulin Pen Needle 32G X 4 MM MISC 1 Device by Does not apply route in the morning, at noon, in the evening, and at bedtime. 200 each 11 05/05/2022  . labetalol (NORMODYNE) 200 MG tablet Take 2 tablets (400 mg total) by mouth 3 (three) times daily. 180 tablet 3 05/05/2022  . metoCLOPramide (REGLAN) 10 MG tablet Take 1 tablet (10 mg total) by mouth 3 (three) times daily before meals. 90 tablet 3 05/05/2022  . NIFEdipine (PROCARDIA XL/NIFEDICAL-XL) 90 MG 24 hr tablet Take 1 tablet (90 mg total) by mouth daily. 30 tablet 2 05/05/2022  . acetaminophen (TYLENOL) 500 MG tablet Take 1,000 mg by mouth every 6 (six) hours as needed.     . Continuous Blood Gluc Sensor (DEXCOM G6 SENSOR) MISC 1 Device by Does not apply route as directed. Every 10 days (Patient not taking: Reported on 05/04/2022) 9 each 3   . Continuous Blood Gluc Transmit (DEXCOM G6 TRANSMITTER) MISC 1 Device by Does not apply route every 3 (three) months. (Patient not taking: Reported on 05/04/2022) 3 each 1   . Insulin Disposable Pump (OMNIPOD 5 G6 INTRO, GEN 5,) KIT 1 Device by Does not apply route every 3 (three) days. (Patient not taking: Reported on 05/04/2022) 1 kit 0   . Insulin Disposable Pump (OMNIPOD 5 G6 POD, GEN 5,) MISC 1 Device by Does not apply route every 3 (three) days. (Patient not taking: Reported on 05/04/2022) 6 each 3   . insulin glargine (LANTUS) 100 UNIT/ML Solostar Pen Inject 22 units subcutaneously nightly. (Patient taking differently: Inject 25 in the morning 20 units subcutaneously nightly.) 15 mL 6   . ondansetron (ZOFRAN-ODT) 4 MG disintegrating tablet Take 1 tablet (4 mg total) by  mouth every 6 (six) hours as needed for nausea. 30 tablet 2   . pantoprazole (PROTONIX) 20 MG tablet Take 2 tablets (40 mg total) by mouth daily. 30 tablet 5   . Prenatal Vit-Fe Fumarate-FA (PRENATAL VITAMIN) 27-0.8 MG TABS Take 1 tablet by mouth daily. 90 tablet 2      Review of Systems   All systems reviewed and negative except as stated in HPI  Blood pressure 136/83, pulse 96, last menstrual period 09/20/2021, SpO2 100 %, not currently breastfeeding. General appearance: {general exam:16600} Lungs: clear  to auscultation bilaterally Heart: regular rate and rhythm Abdomen: soft, non-tender; bowel sounds normal Extremities: Homans sign is negative, no sign of DVT Presentation: cephalic Fetal monitoring Baseline: 145 bpm, Variability: Good {> 6 bpm), Accelerations: Non-reactive but appropriate for gestational age, and Decelerations: Absent Uterine activity every 1-2 min Dilation: 2 Effacement (%): 50 Station: -2 Exam by:: Fatima Blank, CNM   Prenatal labs: ABO, Rh: --/--/A NEG (02/13 1210) Antibody: Negative (05/22 0844) Rubella: 1.55 (01/23 1119) RPR: Non Reactive (05/22 0843)  HBsAg: Negative (01/23 1119)  HIV: Non Reactive (05/22 0843)  GBS: Positive/-- (07/14 0000)  2 hr Glucola *** Genetic screening  LR NIPS, silent carrier for alpha thalassemia, increased SMA risk Anatomy US normal  Prenatal Transfer Tool  Maternal Diabetes: Yes:  Diabetes Type:  Pre-pregnancy Genetic Screening: Abnormal:  Results: Other:silent carrier for alpha thalassemia, increased SMA risk Maternal Ultrasounds/Referrals: Normal Fetal Ultrasounds or other Referrals:  Other: ordered but not done yet Maternal Substance Abuse:  No Significant Maternal Medications:  Meds include: Other: insulin, labetalol, procardia Significant Maternal Lab Results: Other: GBS unknown  No results found for this or any previous visit (from the past 24 hour(s)).  Patient Active Problem List   Diagnosis Date  Noted  . Preterm premature rupture of membranes 05/05/2022  . High risk pregnancy, antepartum 04/24/2022  . 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 Type 1 diabetes mellitus with diabetic polyneuropathy ([place]) 03/18/2022  . Type 1 diabetes mellitus with hyperglycemia ([place]) 03/18/2022  . Hypomagnesemia 03/01/2022  . Red Chart Rounds 02/26/2022  . Hypokalemia 01/06/2022  . Alpha thalassemia silent carrier 01/05/2022  . DKA, type 1 ([place]) 01/05/2022  . Atypical chest pain 01/04/2022  . Blurry vision 01/04/2022  . DM type 1 causing eye disease ([place]) 01/04/2022  . Shortness of breath 01/04/2022  . Proteinuria affecting pregnancy 12/16/2021  . Chronic hypertension affecting pregnancy 12/15/2021  . Short interval between pregnancies affecting pregnancy, antepartum 12/15/2021  . History of severe preeclampsia, prior pregnancy, currently pregnant 12/15/2021  . Previous preterm delivery at 33w6 due to severe preeclampsia, antepartum 12/15/2021  . History of forceps delivery in prior pregnancy, currently pregnant 12/15/2021  . Type 1 diabetes mellitus affecting pregnancy in third trimester, antepartum 12/11/2021  . Supervision of high risk pregnancy, antepartum 11/27/2021  . Rh negative, antepartum 11/27/2021  . Poor compliance 08/04/2021  . Hypertension 08/04/2021  . Type 1 diabetes mellitus with ketoacidosis, uncontrolled ([place]) 02/22/2020  . Language barrier 01/11/2020  . 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 [phone] with IUP at 58w4dby early [place] for PPROM and contractions  #PPROM #Preterm contractions Patient reports ROM at *** and came in with strong contractions with urge to push. In MAU found to be 2 cm and grossly ruptured. Admit for PPROM - Mg for neuroprophylaxis and for added tocolytic benefit while ge   #ID:  GBS unknown, ordered antibiotics for PPROM (amp/azithro), GBS  culture ordered #MOF: formula #MOC:unsure #Circ:  ***  #T1DM ***Home insulin regimen ***.Multiple admissions for DKA in current pregnancy. Also with known diabetic retinopathy and peripheral neuropathy 5/23 growth [place]- AFI normal, EFW 22%ile with AC 37%ile - endotool while here  #cHTN on meds Patient on Procardia and Labetalol. Initial BP 162/84 in MAU in setting of discomfort and sensation to push. Repeat 146/8- and 136/83. ***. CMP and CBC sent at admission. Will not get urine protein:creatinine since ROM. Patient with  hx of preE with SF with prior pregnancy (delivery in 2022) - Continue home Procardia 90 daily and Labetalol 47m TID  #Rh neg Received rhogam on 04/13/2022. Plan for rhogam workup after delivery  #FGC type ***  ARenard Matter MD  05/05/2022, 11:48 PM

## 2022-05-05 NOTE — H&P (Shared)
OBSTETRIC ADMISSION HISTORY AND PHYSICAL iPAD Arabic interpreter used for entire encounter  Kristin Clay Kristin Clay is a 36 y.o. female [phone] with IUP at 71w4dby early [place] for PPROM and contractions. She reports +FMs,  no VB, no blurry vision, headaches or peripheral edema, and RUQ pain.  She reports she started having contractions and reported leaking of fluid  Of note, patient also with history of cHTN (on Labetalol and Procardia), T1DM, and history of preterm delivery at 33 weeks in setting of severe pre-eclampsia  She plans on formula feeding. She is unsure regarding birth control. 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  Past Medical History: Past Medical History:  Diagnosis Date   Chronic hypertension    Complication of anesthesia    with hand surgery, local anes did not work, tried twice- had to put her to sleep   Diabetic retinopathy ([facility]    DKA (diabetic ketoacidosis) ([facility] 10/2020   Epidural hematoma ([facility] 07/04/2021   Obstetric vaginal laceration with type 3c third degree perineal laceration 06/29/2021   Pyelonephritis 10/2020   Rh negative status during pregnancy 05/01/2021   s/p Rhogam 05/13/21   Type I diabetes mellitus ([facility]    dx 126yrago   UTI (urinary tract infection)     Past Surgical History: Past Surgical History:  Procedure Laterality Date   female circumcision Bilateral    clitorectomy as well   FOOT SURGERY  2018   HAND SURGERY  2019    Obstetrical History: OB History     Gravida  3   Para  1   Term   0   Preterm  1   AB  1   Living  1      SAB  1   IAB  0   Ectopic  0   Multiple  0   Live Births  1           Social History Social History   Socioeconomic History   Marital status: Married    Spouse name: Not on file   Number of children: Not on file   Years of education: Not on file   Highest education level: Not on file  Occupational History   Not on file  Tobacco Use   Smoking status: Never   Smokeless tobacco: Never  Vaping Use   Vaping Use: Never used  Substance and Sexual Activity   Alcohol use: Never   Drug use: Never   Sexual activity: Not Currently    Birth control/protection: None  Other Topics Concern   Not on file  Social History Narrative   ** Merged History Encounter **       ** Merged History Encounter **       Social Determinants of Health   Financial Resource Strain: High Risk (05/30/2021)   Overall Financial Resource Strain (CARDIA)    Difficulty of Paying Living Expenses: Very hard  Food Insecurity: Food Insecurity Present (03/12/2022)   Hunger Vital Sign    Worried About Running Out of Food in the Last Year: Sometimes true  Ran Out of Food in the Last Year: Sometimes true  Transportation Needs: Unmet Transportation Needs (03/12/2022)   PRAPARE - Hydrologist (Medical): Yes    Lack of Transportation (Non-Medical): Yes  Physical Activity: Not on file  Stress: Not on file  Social Connections: Not on file    Family History: Family History  Problem Relation Age of Onset   Hyperlipidemia Mother    Hypertension Mother    Kidney disease Father    Alzheimer's disease Father     Allergies: Allergies  Allergen Reactions   Pork-Derived Products     Medications Prior to Admission  Medication Sig Dispense Refill Last Dose   Accu-Chek Softclix Lancets lancets Use as instructed; check blood glucose 4 times daily 200 each 10 05/05/2022   aspirin (ASPIRIN LOW DOSE) 81 MG chewable tablet CHEW 1  TABLET BY MOUTH DAILY. 90 tablet 1 05/05/2022   Blood Glucose Monitoring Suppl (ACCU-CHEK GUIDE) w/Device KIT use as directed 4 (four) times daily. 1 kit 0 05/05/2022   glucose blood (ACCU-CHEK GUIDE) test strip Use as instructed; check blood glucose 4 times daily 200 each 10 05/05/2022   insulin aspart (NOVOLOG FLEXPEN) 100 UNIT/ML FlexPen Inject 3 units subcutaneously with meals. 30 mL 11 05/05/2022   Insulin Pen Needle 32G X 4 MM MISC 1 Device by Does not apply route in the morning, at noon, in the evening, and at bedtime. 200 each 11 05/05/2022   labetalol (NORMODYNE) 200 MG tablet Take 2 tablets (400 mg total) by mouth 3 (three) times daily. 180 tablet 3 05/05/2022   metoCLOPramide (REGLAN) 10 MG tablet Take 1 tablet (10 mg total) by mouth 3 (three) times daily before meals. 90 tablet 3 05/05/2022   NIFEdipine (PROCARDIA XL/NIFEDICAL-XL) 90 MG 24 hr tablet Take 1 tablet (90 mg total) by mouth daily. 30 tablet 2 05/05/2022   acetaminophen (TYLENOL) 500 MG tablet Take 1,000 mg by mouth every 6 (six) hours as needed.      Continuous Blood Gluc Sensor (DEXCOM G6 SENSOR) MISC 1 Device by Does not apply route as directed. Every 10 days (Patient not taking: Reported on 05/04/2022) 9 each 3    Continuous Blood Gluc Transmit (DEXCOM G6 TRANSMITTER) MISC 1 Device by Does not apply route every 3 (three) months. (Patient not taking: Reported on 05/04/2022) 3 each 1    Insulin Disposable Pump (OMNIPOD 5 G6 INTRO, GEN 5,) KIT 1 Device by Does not apply route every 3 (three) days. (Patient not taking: Reported on 05/04/2022) 1 kit 0    Insulin Disposable Pump (OMNIPOD 5 G6 POD, GEN 5,) MISC 1 Device by Does not apply route every 3 (three) days. (Patient not taking: Reported on 05/04/2022) 6 each 3    insulin glargine (LANTUS) 100 UNIT/ML Solostar Pen Inject 22 units subcutaneously nightly. (Patient taking differently: Inject 25 in the morning 20 units subcutaneously nightly.) 15 mL 6    ondansetron (ZOFRAN-ODT) 4 MG  disintegrating tablet Take 1 tablet (4 mg total) by mouth every 6 (six) hours as needed for nausea. 30 tablet 2    pantoprazole (PROTONIX) 20 MG tablet Take 2 tablets (40 mg total) by mouth daily. 30 tablet 5    Prenatal Vit-Fe Fumarate-FA (PRENATAL VITAMIN) 27-0.8 MG TABS Take 1 tablet by mouth daily. 90 tablet 2      Review of Systems   All systems reviewed and negative except as stated in HPI  Blood pressure 124/83, pulse (!) 108, temperature 98 F (36.7  C), temperature source Oral, resp. rate 20, last menstrual period 09/20/2021, SpO2 100 %, not currently breastfeeding. General appearance:  initially resting comfortably, then became more uncomfortable and moving around in pain after Mg bolus started  Lungs: clear to auscultation bilaterally Heart: regular rate and rhythm Abdomen: soft, non-tender; bowel sounds normal Extremities: Homans sign is negative, no sign of DVT Presentation: cephalic Fetal monitoring Baseline: 145 bpm, Variability: Good {> 6 bpm), Accelerations: Non-reactive but appropriate for gestational age, and Decelerations: Absent Uterine activity every 1-2 min Dilation: 2 Effacement (%): 50 Station: -3 (-3, -4) Exam by:: Darlin Priestly, MD   Prenatal labs: ABO, Rh: --/--/A NEG (02/13 1210) Antibody: Negative (05/22 0844) Rubella: 1.55 (01/23 1119) RPR: Non Reactive (05/22 0843)  HBsAg: Negative (01/23 1119)  HIV: Non Reactive (05/22 0843)  GBS: unknown, positive in prior pregnancy in 2022 A1c 7.4 on 04/28/2022 Genetic screening  LR NIPS, silent carrier for alpha thalassemia, increased SMA risk Anatomy US normal  Prenatal Transfer Tool  Maternal Diabetes: Yes:  Diabetes Type:  Pre-pregnancy Genetic Screening: Abnormal:  Results: Other:silent carrier for alpha thalassemia, increased SMA risk Maternal Ultrasounds/Referrals: Normal Fetal Ultrasounds or other Referrals:  Other: ordered but not done yet Maternal Substance Abuse:  No Significant Maternal Medications:   Meds include: Other: insulin, labetalol, procardia Significant Maternal Lab Results: Other: GBS unknown  Results for orders placed or performed during the hospital encounter of 05/05/22 (from the past 24 hour(s))  Glucose, capillary   Collection Time: 05/06/22 12:44 AM  Result Value Ref Range   Glucose-Capillary 190 (H) 70 - 99 mg/dL  Glucose, capillary   Collection Time: 05/06/22  1:55 AM  Result Value Ref Range   Glucose-Capillary 106 (H) 70 - 99 mg/dL    Patient Active Problem List   Diagnosis Date Noted   Preterm premature rupture of membranes 05/05/2022   High risk pregnancy, antepartum 04/24/2022   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 ([place]) 03/18/2022   Type 1 diabetes mellitus with diabetic polyneuropathy ([place]) 03/18/2022   Type 1 diabetes mellitus with hyperglycemia ([place]) 03/18/2022   Hypomagnesemia 03/01/2022   Red Chart Rounds 02/26/2022   Hypokalemia 01/06/2022   Alpha thalassemia silent carrier 01/05/2022   DKA, type 1 ([place]) 01/05/2022   Atypical chest pain 01/04/2022   Blurry vision 01/04/2022   DM type 1 causing eye disease ([place]) 01/04/2022   Shortness of breath 01/04/2022   Proteinuria affecting pregnancy 12/16/2021   Chronic hypertension affecting pregnancy 12/15/2021   Short interval between pregnancies affecting pregnancy, antepartum 12/15/2021   History of severe preeclampsia, prior pregnancy, currently pregnant 12/15/2021   Previous preterm delivery at 33w6 due to severe preeclampsia, antepartum 12/15/2021   History of forceps delivery in prior pregnancy, currently pregnant 12/15/2021   Type 1 diabetes mellitus affecting pregnancy in third trimester, antepartum 12/11/2021   Supervision of high risk pregnancy, antepartum 11/27/2021   Rh negative, antepartum 11/27/2021   Poor compliance 08/04/2021   Hypertension 08/04/2021   Type 1 diabetes mellitus with ketoacidosis, uncontrolled ([place]) 02/22/2020   Language barrier 01/11/2020    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 [id number] with IUP at 53w4dby early [place] for PPROM and contractions  #PPROM #Preterm contractions Patient reports ROM on 6/13 evening (could not specify time) and came in with strong contractions with urge to push. In MAU found to be 2 cm and grossly ruptured. Admit for PPROM - beta methasone for fetal  lung maturity - Mg for neuroprophylaxis and for added tocolytic benefit while getting beta methasone for fetal lung maturity - amp/azithro for antibiotics - NICU called by MAU provider ([place], CNM) - will plan to monitor on L&D given contracting painfully. If contractions space out could consider transfer to [facility] for 2nd dose of beta methasone and further monitoring - once on L&D patient started having more intense pain and discomfort and vaginal pressure. Cervix checked and unchanged at 2 cm. Patient then felt flushed and body aches and thought to be more in setting of Mg bolus. Will continue to monitor and avoid checks if possible given ROM - discussed indication for admission (PPROM) and treatment as outlined above in detail with patient with iPAD arabic interpreter. Patient expressed understanding.  #ID:  GBS unknown, ordered antibiotics for PPROM (amp/azithro), GBS culture ordered #MOF: formula #MOC:unsure #Circ:  Need to confirm once patient more uncomfortable  #T1DM Home insulin regimen .Multiple admissions for DKA in current pregnancy. Also with known diabetic retinopathy and peripheral neuropathy 5/23 growth [place] - AFI normal, EFW 22%ile with AC 37%ile - endotool while here  #cHTN on meds Patient on Procardia and Labetalol. Initial BP 162/84 in MAU in setting of discomfort and sensation to push. Repeat 146/8- and 136/83. CMP and CBC sent at admission. Will not get urine protein:creatinine since ROM. Patient with hx of preE with SF with prior pregnancy requiring IOL at  [place] (delivery in 2022). Patient reports last took Procardia over 24 hours ago but took Labetalol in the past day. Once on L&D and on Mg and received Labetalol, BP improved to 120s/80s.  - Continue home Procardia 90 daily (start at 10 AM tomorrow given BP improved with Mg) and Labetalol 451m TID  #Rh neg Received rhogam on 04/13/2022. Plan for rhogam workup after delivery  #FGC type III Will discuss further regarding wishes for delivery once patient feeling better.  #Short interval pregnancy Last delivery in Aug 2022  #History of Forceps delivery- fetal bradycardia while preterm and FGR (33weeks) #History of 3c perineal laceration   ARenard Matter MD, MPH OB Fellow, Faculty Practice

## 2022-05-05 NOTE — MAU Provider Note (Signed)
Chief Complaint:  No chief complaint on file.   None     HPI: Kristin Clay is a 36 y.o. [phone] at 96w4dby midtrimester ultrasound who presents to maternity admissions reporting painful contractions, leaking fluid, and urge to push. She reports good fetal movement.    HPI  Past Medical History: Past Medical History:  Diagnosis Date   Chronic hypertension    Complication of anesthesia    with hand surgery, local anes did not work, tried twice- had to put her to sleep   Diabetic retinopathy ([place]    [place]) [place] 10[place]   Epidural hematoma ([facility] 07/04/2021   Obstetric vaginal laceration with type 3c third degree perineal laceration 06/29/2021   Pyelonephritis 10/2020   Rh negative status during pregnancy 05/01/2021   s/p Rhogam 05/13/21   Type I diabetes mellitus ([facility]    dx [place]   UTI (urinary tract infection)     Past obstetric history: OB History  Gravida Para Term Preterm AB Living  3 1 0 _0 SAB IAB Ectopic Multiple Live Births  1 0 0 0 1    # Outcome Date GA Lbr Len/2nd Weight Sex Delivery Anes PTL Lv  3 Current           2 Preterm 06/29/21 [id number] 1860 g F Vag-Forceps EPI  LIV     Birth Comments: WDL  1 SAB 12/2019            Past Surgical History: Past Surgical History:  Procedure Laterality Date   female circumcision Bilateral    clitorectomy as well   FOOT SURGERY  2018   HAND SURGERY  2019    Family History: Family History  Problem Relation Age of Onset   Hyperlipidemia Mother    Hypertension Mother    Kidney disease Father    Alzheimer's disease Father     Social History: Social History   Tobacco Use   Smoking status: Never   Smokeless tobacco: Never  Vaping Use   Vaping Use: Never used  Substance Use Topics   Alcohol use: Never   Drug use: Never    Allergies:  Allergies  Allergen Reactions   Pork-Derived Products     Meds:  Medications Prior to Admission  Medication Sig  Dispense Refill Last Dose   Accu-Chek Softclix Lancets lancets Use as instructed; check blood glucose 4 times daily 200 each 10 05/05/2022   aspirin (ASPIRIN LOW DOSE) 81 MG chewable tablet CHEW 1 TABLET BY MOUTH DAILY. 90 tablet 1 05/05/2022   Blood Glucose Monitoring Suppl (ACCU-CHEK GUIDE) w/Device KIT use as directed 4 (four) times daily. 1 kit 0 05/05/2022   glucose blood (ACCU-CHEK GUIDE) test strip Use as instructed; check blood glucose 4 times daily 200 each 10 05/05/2022   insulin aspart (NOVOLOG FLEXPEN) 100 UNIT/ML FlexPen Inject 3 units subcutaneously with meals. 30 mL 11 05/05/2022   Insulin Pen Needle 32G X 4 MM MISC 1 Device by Does not apply route in the morning, at noon, in the evening, and at bedtime. 200 each 11 05/05/2022   labetalol (NORMODYNE) 200 MG tablet Take 2 tablets (400 mg total) by mouth 3 (three) times daily. 180 tablet 3 05/05/2022   metoCLOPramide (REGLAN) 10 MG tablet Take 1 tablet (10 mg total) by mouth 3 (three) times daily before meals. 90 tablet 3 05/05/2022   NIFEdipine (PROCARDIA XL/NIFEDICAL-XL) 90 MG 24 hr tablet Take 1 tablet (90 mg total) by mouth  daily. 30 tablet 2 05/05/2022   acetaminophen (TYLENOL) 500 MG tablet Take 1,000 mg by mouth every 6 (six) hours as needed.      Continuous Blood Gluc Sensor (DEXCOM G6 SENSOR) MISC 1 Device by Does not apply route as directed. Every 10 days (Patient not taking: Reported on 05/04/2022) 9 each 3    Continuous Blood Gluc Transmit (DEXCOM G6 TRANSMITTER) MISC 1 Device by Does not apply route every 3 (three) months. (Patient not taking: Reported on 05/04/2022) 3 each 1    Insulin Disposable Pump (OMNIPOD 5 G6 INTRO, GEN 5,) KIT 1 Device by Does not apply route every 3 (three) days. (Patient not taking: Reported on 05/04/2022) 1 kit 0    Insulin Disposable Pump (OMNIPOD 5 G6 POD, GEN 5,) MISC 1 Device by Does not apply route every 3 (three) days. (Patient not taking: Reported on 05/04/2022) 6 each 3    insulin glargine (LANTUS) 100  UNIT/ML Solostar Pen Inject 22 units subcutaneously nightly. (Patient taking differently: Inject 25 in the morning 20 units subcutaneously nightly.) 15 mL 6    ondansetron (ZOFRAN-ODT) 4 MG disintegrating tablet Take 1 tablet (4 mg total) by mouth every 6 (six) hours as needed for nausea. 30 tablet 2    pantoprazole (PROTONIX) 20 MG tablet Take 2 tablets (40 mg total) by mouth daily. 30 tablet 5    Prenatal Vit-Fe Fumarate-FA (PRENATAL VITAMIN) 27-0.8 MG TABS Take 1 tablet by mouth daily. 90 tablet 2     ROS:  Review of Systems  Constitutional:  Negative for chills, fatigue and fever.  Eyes:  Negative for visual disturbance.  Respiratory:  Negative for shortness of breath.   Cardiovascular:  Negative for chest pain.  Gastrointestinal:  Positive for abdominal pain. Negative for nausea and vomiting.  Genitourinary:  Positive for pelvic pain and vaginal discharge. Negative for difficulty urinating, dysuria, flank pain, vaginal bleeding and vaginal pain.  Neurological:  Negative for dizziness and headaches.  Psychiatric/Behavioral: Negative.       I have reviewed patient's Past Medical Hx, Surgical Hx, Family Hx, Social Hx, medications and allergies.   Physical Exam  No data found. Constitutional: Well-developed, well-nourished female in no acute distress.  Cardiovascular: normal rate Respiratory: normal effort GI: Abd soft, non-tender, gravid appropriate for gestational age.  MS: Extremities nontender, no edema, normal ROM Neurologic: Alert and oriented x 4.  GU: Neg CVAT.  PELVIC EXAM: Cervix pink, visually closed, without lesion, scant white creamy discharge, vaginal walls and external genitalia normal Bimanual exam: Cervix 0/long/high, firm, anterior, neg CMT, uterus nontender, nonenlarged, adnexa without tenderness, enlargement, or mass  Dilation: 2 Effacement (%): 50 Station: -2 Presentation: Vertex Exam by:: Fatima Blank, CNM  FHT:  Baseline 145 , moderate  variability, no accelerations, no decelerations Contractions: q 2 mins, moderate to palpation   Labs: No results found for this or any previous visit (from the past 24 hour(s)). --/--/A NEG (02/13 1210)  Imaging:   MAU Course/MDM: No orders of the defined types were placed in this encounter.   No orders of the defined types were placed in this encounter.    NST reviewed and appropriate for gestational age Consult Dr Roselie Awkward with presentation, exam findings and test results.  Admit to L&D for PPROM with onset of preterm labor CHTN with severe range BPs on admission Mag sulfate, BMZ, insulin coverage for DM1    Assessment: 1. Preterm labor in third trimester without delivery   2. Type 1 diabetes mellitus with diabetic polyneuropathy (  [place])   3. Chronic hypertension affecting pregnancy   4. [redacted] weeks gestation of pregnancy     Plan: Admit to L&D, see H&P for details    Fatima Blank Certified Nurse-Midwife 05/05/2022 11:22 PM

## 2022-05-06 ENCOUNTER — Encounter (HOSPITAL_COMMUNITY): Payer: Self-pay | Admitting: Obstetrics & Gynecology

## 2022-05-06 LAB — COMPREHENSIVE METABOLIC PANEL
ALT: 9 U/L (ref 0–44)
AST: 15 U/L (ref 15–41)
Albumin: 2.4 g/dL — ABNORMAL LOW (ref 3.5–5.0)
Alkaline Phosphatase: 68 U/L (ref 38–126)
Anion gap: 7 (ref 5–15)
BUN: 6 mg/dL (ref 6–20)
CO2: 19 mmol/L — ABNORMAL LOW (ref 22–32)
Calcium: 8 mg/dL — ABNORMAL LOW (ref 8.9–10.3)
Chloride: 108 mmol/L (ref 98–111)
Creatinine, Ser: 0.48 mg/dL (ref 0.44–1.00)
GFR, Estimated: >60 mL/min (ref >60)
Glucose, Bld: 227 mg/dL — ABNORMAL HIGH (ref 70–99)
Potassium: 3.5 mmol/L (ref 3.5–5.1)
Sodium: 134 mmol/L — ABNORMAL LOW (ref 135–145)
Total Bilirubin: 0.2 mg/dL — ABNORMAL LOW (ref 0.3–1.2)
Total Protein: 5.9 g/dL — ABNORMAL LOW (ref 6.5–8.1)

## 2022-05-06 LAB — GLUCOSE, CAPILLARY
Glucose-Capillary: 101 mg/dL — ABNORMAL HIGH (ref 70–99)
Glucose-Capillary: 106 mg/dL — ABNORMAL HIGH (ref 70–99)
Glucose-Capillary: 109 mg/dL — ABNORMAL HIGH (ref 70–99)
Glucose-Capillary: 118 mg/dL — ABNORMAL HIGH (ref 70–99)
Glucose-Capillary: 121 mg/dL — ABNORMAL HIGH (ref 70–99)
Glucose-Capillary: 126 mg/dL — ABNORMAL HIGH (ref 70–99)
Glucose-Capillary: 130 mg/dL — ABNORMAL HIGH (ref 70–99)
Glucose-Capillary: 130 mg/dL — ABNORMAL HIGH (ref 70–99)
Glucose-Capillary: 142 mg/dL — ABNORMAL HIGH (ref 70–99)
Glucose-Capillary: 144 mg/dL — ABNORMAL HIGH (ref 70–99)
Glucose-Capillary: 154 mg/dL — ABNORMAL HIGH (ref 70–99)
Glucose-Capillary: 159 mg/dL — ABNORMAL HIGH (ref 70–99)
Glucose-Capillary: 163 mg/dL — ABNORMAL HIGH (ref 70–99)
Glucose-Capillary: 167 mg/dL — ABNORMAL HIGH (ref 70–99)
Glucose-Capillary: 175 mg/dL — ABNORMAL HIGH (ref 70–99)
Glucose-Capillary: 178 mg/dL — ABNORMAL HIGH (ref 70–99)
Glucose-Capillary: 180 mg/dL — ABNORMAL HIGH (ref 70–99)
Glucose-Capillary: 184 mg/dL — ABNORMAL HIGH (ref 70–99)
Glucose-Capillary: 189 mg/dL — ABNORMAL HIGH (ref 70–99)
Glucose-Capillary: 190 mg/dL — ABNORMAL HIGH (ref 70–99)
Glucose-Capillary: 198 mg/dL — ABNORMAL HIGH (ref 70–99)
Glucose-Capillary: 200 mg/dL — ABNORMAL HIGH (ref 70–99)
Glucose-Capillary: 208 mg/dL — ABNORMAL HIGH (ref 70–99)

## 2022-05-06 LAB — MAGNESIUM
Magnesium: 11.2 mg/dL (ref 1.7–2.4)
Magnesium: 4.8 mg/dL — ABNORMAL HIGH (ref 1.7–2.4)

## 2022-05-06 LAB — CBC
HCT: 36.3 % (ref 36.0–46.0)
Hemoglobin: 12.3 g/dL (ref 12.0–15.0)
MCH: 29.4 pg (ref 26.0–34.0)
MCHC: 33.9 g/dL (ref 30.0–36.0)
MCV: 86.8 fL (ref 80.0–100.0)
Platelets: 227 10*3/uL (ref 150–400)
RBC: 4.18 MIL/uL (ref 3.87–5.11)
RDW: 13.8 % (ref 11.5–15.5)
WBC: 4.2 10*3/uL (ref 4.0–10.5)
nRBC: 0 % (ref 0.0–0.2)

## 2022-05-06 LAB — TYPE AND SCREEN
ABO/RH(D): A NEG
Antibody Screen: POSITIVE

## 2022-05-06 LAB — BETA-HYDROXYBUTYRIC ACID: Beta-Hydroxybutyric Acid: 0.06 mmol/L (ref 0.05–0.27)

## 2022-05-06 LAB — PHOSPHORUS: Phosphorus: 1.3 mg/dL — ABNORMAL LOW (ref 2.5–4.6)

## 2022-05-06 LAB — RPR: RPR Ser Ql: NONREACTIVE

## 2022-05-06 MED ORDER — FENTANYL CITRATE (PF) 100 MCG/2ML IJ SOLN: 100.0000 ug | INTRAMUSCULAR | Status: DC | PRN

## 2022-05-06 MED ORDER — MAGNESIUM SULFATE 40 GM/1000ML IV SOLN
2.0000 g/h | INTRAVENOUS | Status: AC
  Administered 2022-05-06 (×2): 2 g/h via INTRAVENOUS
  Filled 2022-05-06 (×2): qty 1000

## 2022-05-06 MED ORDER — LACTATED RINGERS IV SOLN: INTRAVENOUS | Status: DC

## 2022-05-06 MED ORDER — MAGNESIUM SULFATE BOLUS VIA INFUSION
2.0000 g | Freq: Once | INTRAVENOUS | Status: AC
  Administered 2022-05-06: 2 g via INTRAVENOUS
  Filled 2022-05-06: qty 1000

## 2022-05-06 MED ORDER — BETAMETHASONE SOD PHOS & ACET 6 (3-3) MG/ML IJ SUSP
12.0000 mg | INTRAMUSCULAR | Status: AC
  Administered 2022-05-06 (×2): 12 mg via INTRAMUSCULAR
  Filled 2022-05-06 (×2): qty 5

## 2022-05-06 MED ORDER — MAGNESIUM SULFATE BOLUS VIA INFUSION
4.0000 g | Freq: Once | INTRAVENOUS | Status: AC
Start: 2022-05-06 — End: 2022-05-06
  Administered 2022-05-06: 4 g via INTRAVENOUS
  Filled 2022-05-06: qty 1000

## 2022-05-06 MED ORDER — FENTANYL CITRATE (PF) 100 MCG/2ML IJ SOLN
INTRAMUSCULAR | Status: AC
  Administered 2022-05-06: 100 ug via INTRAVENOUS
  Filled 2022-05-06: qty 2

## 2022-05-06 NOTE — Progress Notes (Signed)
Labor Progress Note Delayed documentation  Kristin Clay is a 36 y.o. [id number] at [redacted]w[redacted]d presented for PPROM  Received call from patient's RN that Mg level is 11. This is very surprising given her Mg bolus was just started and there are no signs or symptoms of Mg toxicity.  Mg paused while repeat Mg level drawn. Found to be 4.8. Mg restarted  Assessed patient at bedside. Reports contractions have improved. Patient inquiring if she would be able to go home. We discussed that given PPROM we recommend staying in hospital until delivery. We discussed that at this time our hope is to try to keep her pregnant until 34 weeks but sometimes there can be signs of distress in fetus or mother that indicates sooner delivery. Patient tearful at that time and reports she has no one to care for her infant at home (26 month old). We brainstormed options and she reports she thinks a neighbor may be able to help but her infant has a cold and her neighbors might not want to help in order to avoid exposure to the virus her 25 month old has. Patient herself is asymptomatic.  We expressed sympathy for the tough position she is in. We still reiterated our recommendation of patient staying in the hospital until delivery. Patient expressed understanding and in agreement with plan.    Social work consult placed to help identify any available childcare resources.  Entire encounter was conducted with iPAD arabic interpreter  Warner Mccreedy, MD, MPH OB Fellow, Faculty Practice

## 2022-05-06 NOTE — Progress Notes (Signed)
Labor Progress Note Kristin Clay is a 36 y.o. [phone] at [redacted]w[redacted]d presented for PPROM and preterm contractions S:  Currently feels better and no longer having contractions. Of note, around 0745 she had a painful contraction. Over the last hour did not have any contractions.  O:  BP 112/72   Pulse 88   Temp (!) 97.5 F (36.4 C) (Oral)   Resp 18   LMP 09/20/2021   SpO2 100%  EFM: 130/moderate variability/accels periodically present/no decels (of note at times patient sitting up and HR monitor tracing maternal HR)  CVE: Dilation: 2 Effacement (%): 50 Station: -3 (-3, -4) Presentation: Vertex Exam by:: Darlin Priestly, MD   A&P: 36 y.o. [id number] at [redacted]w[redacted]d presented for PPROM and preterm contractions  #PPROM #preterm contractions Contractions have become less frequent. Cat I tracing. Patient felt one strong contraction around 0745. Since then has not felt any contractions. Additionally, did not have contractions ~0400 and was resting comfortably since then. Will defer cervix check at this time since patient ruptured. Given no signs of active labor at this time will transfer to antepartum for further monitoring and second dose of beta methasone.  - continue Mg - Continue Abx - continuous monitoring at this time given Mg  #cHTN BP improved since starting Mg and now in 90s-110s/60s-70s. Will hold home Procardia and continue home Labetalol 400mg  TID  #T1DM Continue endotool at this time  Discussed plan in depth with patient and answered all questions and concerns. Patient in agreement with plan. Discussed with Dr. Harolyn Rutherford (second attending) who agrees with plan.  Arabic iPAD interpreter used for entire conversation  Renard Matter, MD 8:43 AM

## 2022-05-06 NOTE — Progress Notes (Signed)
Inpatient Diabetes Program Recommendations  AACE/ADA: New Consensus Statement on Inpatient Glycemic Control (2015)  Target Ranges:  Prepandial:   less than 140 mg/dL      Peak postprandial:   less than 180 mg/dL (1-2 hours)      Critically ill patients:  140 - 180 mg/dL   Lab Results  Component Value Date   GLUCAP 130 (H) 05/06/2022   HGBA1C 7.4 (A) 04/28/2022    Review of Glycemic Control  Latest Reference Range & Units 05/06/22 04:40 05/06/22 05:17 05/06/22 06:20  Glucose-Capillary 70 - 99 mg/dL 883 (H) 254 (H) 982 (H)   Diabetes history: DM 1 Outpatient Diabetes medications:  Lantus 25 units in the AM and Lantus 20 units q HS Novolog 15 units tid with meals (plus post-prandial correction) Current orders for Inpatient glycemic control:  IV insulin  For second dose of Betamethasone at 0015  Inpatient Diabetes Program Recommendations:    Recommend continuation of IV insulin for now.  Patient getting second dose of Betamethasone at 0015 on 05/07/22.  Will continue to follow.   Thanks,  Beryl Meager, RN, BC-ADM Inpatient Diabetes Coordinator Pager [phone]  (8a-5p)

## 2022-05-07 DIAGNOSIS — E109 Type 1 diabetes mellitus without complications: Secondary | ICD-10-CM | POA: Diagnosis not present

## 2022-05-07 DIAGNOSIS — O42119 Preterm premature rupture of membranes, onset of labor more than 24 hours following rupture, unspecified trimester: Secondary | ICD-10-CM

## 2022-05-07 DIAGNOSIS — O1413 Severe pre-eclampsia, third trimester: Secondary | ICD-10-CM | POA: Diagnosis not present

## 2022-05-07 DIAGNOSIS — O10919 Unspecified pre-existing hypertension complicating pregnancy, unspecified trimester: Secondary | ICD-10-CM | POA: Diagnosis not present

## 2022-05-07 LAB — GLUCOSE, CAPILLARY
Glucose-Capillary: 100 mg/dL — ABNORMAL HIGH (ref 70–99)
Glucose-Capillary: 102 mg/dL — ABNORMAL HIGH (ref 70–99)
Glucose-Capillary: 119 mg/dL — ABNORMAL HIGH (ref 70–99)
Glucose-Capillary: 126 mg/dL — ABNORMAL HIGH (ref 70–99)
Glucose-Capillary: 136 mg/dL — ABNORMAL HIGH (ref 70–99)
Glucose-Capillary: 143 mg/dL — ABNORMAL HIGH (ref 70–99)
Glucose-Capillary: 148 mg/dL — ABNORMAL HIGH (ref 70–99)
Glucose-Capillary: 150 mg/dL — ABNORMAL HIGH (ref 70–99)
Glucose-Capillary: 152 mg/dL — ABNORMAL HIGH (ref 70–99)
Glucose-Capillary: 155 mg/dL — ABNORMAL HIGH (ref 70–99)
Glucose-Capillary: 157 mg/dL — ABNORMAL HIGH (ref 70–99)
Glucose-Capillary: 163 mg/dL — ABNORMAL HIGH (ref 70–99)
Glucose-Capillary: 168 mg/dL — ABNORMAL HIGH (ref 70–99)
Glucose-Capillary: 174 mg/dL — ABNORMAL HIGH (ref 70–99)
Glucose-Capillary: 182 mg/dL — ABNORMAL HIGH (ref 70–99)
Glucose-Capillary: 193 mg/dL — ABNORMAL HIGH (ref 70–99)
Glucose-Capillary: 204 mg/dL — ABNORMAL HIGH (ref 70–99)
Glucose-Capillary: 209 mg/dL — ABNORMAL HIGH (ref 70–99)
Glucose-Capillary: 258 mg/dL — ABNORMAL HIGH (ref 70–99)
Glucose-Capillary: 64 mg/dL — ABNORMAL LOW (ref 70–99)
Glucose-Capillary: 78 mg/dL (ref 70–99)
Glucose-Capillary: 85 mg/dL (ref 70–99)
Glucose-Capillary: 88 mg/dL (ref 70–99)
Glucose-Capillary: 93 mg/dL (ref 70–99)
Glucose-Capillary: 93 mg/dL (ref 70–99)
Glucose-Capillary: 98 mg/dL (ref 70–99)

## 2022-05-07 MED ORDER — DEXTROSE 50 % IV SOLN: 10.0000 mL | Freq: Once | INTRAVENOUS | Status: AC

## 2022-05-07 MED ORDER — DEXTROSE 50 % IV SOLN
INTRAVENOUS | Status: AC
  Administered 2022-05-07: 50 mL
  Filled 2022-05-07: qty 50

## 2022-05-07 MED ORDER — NIFEDIPINE ER OSMOTIC RELEASE 60 MG PO TB24
90.0000 mg | ORAL_TABLET | Freq: Every day | ORAL | Status: DC
  Administered 2022-05-07 – 2022-05-16 (×10): 90 mg via ORAL
  Filled 2022-05-07 (×10): qty 1

## 2022-05-07 MED ORDER — POLYETHYLENE GLYCOL 3350 17 G PO PACK
17.0000 g | PACK | Freq: Two times a day (BID) | ORAL | Status: DC
  Administered 2022-05-07 – 2022-05-14 (×12): 17 g via ORAL
  Filled 2022-05-07 (×18): qty 1

## 2022-05-07 NOTE — Progress Notes (Signed)
Inpatient Diabetes Program Recommendations  Diabetes Treatment Program Recommendations  ADA Standards of Care Diabetes in Pregnancy Target Glucose Ranges:  Fasting: 70 - 95 mg/dL 1 hr postprandial: Less than 140mg /dL (from first bite of meal) 2 hr postprandial: Less than 120 mg/dL (from first bite of meal)    Lab Results  Component Value Date   GLUCAP 143 (H) 05/07/2022   HGBA1C 7.4 (A) 04/28/2022    Diabetes history: DM 1 Outpatient Diabetes medications:  Lantus 25 units in the AM and Lantus 20 units q HS Novolog 15 units tid with meals (plus post-prandial correction) Current orders for Inpatient glycemic control:  IV insulin  BMZ x 2(2)   Inpatient Diabetes Program Recommendations:     Recommend continuation of IV insulin given elevated insulin needs with steroids. Will look to transition on 6/16.  Follow.   Thanks, 7/16, MSN, RNC-OB Diabetes Coordinator [phone] (8a-5p)

## 2022-05-07 NOTE — Consult Note (Signed)
Asked by [facility] to provide prenatal consultation for 36 y.o. G3 P0-1-1-1 mother with Type 1 DM on insulin and chronic HTN, who is now 31.[redacted] weeks EGA, with pregnancy complicated by PPROM (on 6/13) and preterm labor.  She is being treated with antibiotics, was given BMZ (2nd dose early today)and Mg for fetal neuroprotection and tocolysis. Ctx have diminished and she was transferred from L&D to [facility]. Her previous child, now 47 months old, was born at 60 wks due to severe pre-eclampsia.  Using the remote interpreter I discussed with patient the usual expectations for a preterm infant at 36 - [redacted] weeks gestation, including possible needs for respiratory support, feeding problems,and criteria for discharge. Mother reports she remembers NICU routine from her previous delivery.  She plans to breast feed, declined use of donor milk with the previous child..  Patient was attentive and had no questions, expressed appreciation for my input.  Thank you for consulting Neonatology.  Total time 30 minutes, face-to-face time 10 minutes  JWimmer, MD

## 2022-05-07 NOTE — Progress Notes (Signed)
CSW meet with MOB at her bedside in room 106 to complete an assessment for needed community resources. When CSW arrived, MOB was resting in bed and talking on the phone.  CSW offered to return at a later time and MOB declined and ended her telephone call.  MOB was polite, easy to engage, and receptive to meeting with CSW.   CSW assessed for need resources. MOB shared having limited family/friends support and lacking childcare for her daughter.  Per MOB, FOB works daily and MOB's daughter has been sick.  MOB shared that MOB's daughter is currently staying with a neighbor.  CSW made MOB aware that there are limited resources for childcare;MOB was understanding.  Without prompting MOB communicated that FOB will need to take time off work until [facility] can discharge from the hospital to help care for their daughter.  MOB also stated that she will also speak with her neighbor to see if the neighbor can continue to assist. MOB shared that she feels comfortable with her daughter staying with her neighbor.  MOB denied all other psychosocial stressors.  However, MOB communicated that she may need assistance with obtaining a car seat and a baby bed when infant is born.  MOB is aware that CSW will assist with needed resources post discharge.   Blaine Hamper, MSW, LCSW Clinical Social Work [phone]

## 2022-05-07 NOTE — Progress Notes (Signed)
FACULTY PRACTICE ANTEPARTUM COMPREHENSIVE PROGRESS NOTE  Kristin Clay is a 36 y.o. [phone] at [redacted]w[redacted]d who is admitted for PPROM in the setting of known T1DM and CHTN.  Estimated Date of Delivery: 07/03/22 Fetal presentation is cephalic.  Length of Stay:  2 Days. Admitted 05/05/2022  Subjective: (Arabic interpreter used for encounter) Patient reports increased pelvic pain especially on her left side.  Patient reports good fetal movement.  She reports no uterine contractions, no bleeding and no loss of fluid per vagina.  Vitals:  Blood pressure (!) 156/89, pulse (!) 101, temperature 98.5 F (36.9 C), temperature source Oral, resp. rate 18, last menstrual period 09/20/2021, SpO2 100 %, not currently breastfeeding. Physical Examination: CONSTITUTIONAL: Well-developed, well-nourished female in no acute distress.  NEUROLOGIC: Alert and oriented to person, place, and time. No cranial nerve deficit noted. PSYCHIATRIC: Normal mood and affect. Normal behavior. Normal judgment and thought content. CARDIOVASCULAR: Normal heart rate noted, regular rhythm RESPIRATORY: Effort and breath sounds normal, no problems with respiration noted MUSCULOSKELETAL: Normal range of motion. No edema and no tenderness. 2+ distal pulses. ABDOMEN: Soft, nontender, nondistended, gravid. CERVIX: Dilation: 1.5 Effacement (%): 50 Station: -3 (-3, -4) Presentation: Vertex Exam by:: Dr. Macon Large Large stool burden palpated in vagina!!  Fetal monitoring: FHR: 140 bpm, Variability: moderate, Accelerations: Present, Decelerations: Absent  Uterine activity: Flat   Results for orders placed or performed during the hospital encounter of 05/05/22 (from the past 48 hour(s))  CBC     Status: None   Collection Time: 05/05/22 11:18 PM  Result Value Ref Range   WBC 4.2 4.0 - 10.5 K/uL   RBC 4.18 3.87 - 5.11 MIL/uL   Hemoglobin 12.3 12.0 - 15.0 g/dL   HCT 26.2 03.5 - 59.7 %   MCV 86.8 80.0 - 100.0 fL   MCH 29.4 26.0 -  34.0 pg   MCHC 33.9 30.0 - 36.0 g/dL   RDW 41.6 38.4 - 53.6 %   Platelets 227 150 - 400 K/uL   nRBC 0.0 0.0 - 0.2 %    Comment: Performed at [facility], [address], [place], [place] [address]  Type and screen [facility]     Status: None   Collection Time: 05/05/22 11:18 PM  Result Value Ref Range   ABO/RH(D) A NEG    Antibody Screen POS    Sample Expiration 05/08/2022,2359    Antibody Identification      PASSIVELY ACQUIRED ANTI-D Performed at [facility], [address] [place], [place] [address]   RPR     Status: None   Collection Time: 05/05/22 11:18 PM  Result Value Ref Range   RPR Ser Ql NON REACTIVE NON REACTIVE    Comment: Performed at [facility], [address], [place], [place] [address]  Comprehensive metabolic panel     Status: Abnormal   Collection Time: 05/05/22 11:18 PM  Result Value Ref Range   Sodium 134 (L) 135 - 145 mmol/L   Potassium 3.5 3.5 - 5.1 mmol/L   Chloride 108 98 - 111 mmol/L   CO2 19 (L) 22 - 32 mmol/L   Glucose, Bld 227 (H) 70 - 99 mg/dL    Comment: Glucose reference range applies only to samples taken after fasting for at least 8 hours.   BUN 6 6 - 20 mg/dL   Creatinine, Ser 3.70 0.44 - 1.00 mg/dL   Calcium 8.0 (L) 8.9 - 10.3 mg/dL   Total Protein 5.9 (L)  6.5 - 8.1 g/dL   Albumin 2.4 (L) 3.5 - 5.0 g/dL   AST 15 15 - 41 U/L   ALT 9 0 - 44 U/L   Alkaline Phosphatase 68 38 - 126 U/L   Total Bilirubin 0.2 (L) 0.3 - 1.2 mg/dL   GFR, Estimated >40 >98 mL/min    Comment: (NOTE) Calculated using the CKD-EPI Creatinine Equation (2021)    Anion gap 7 5 - 15    Comment: Performed at [facility], [address] [place], [place] [address]  Glucose, capillary     Status: Abnormal   Collection Time: 05/06/22 12:44 AM  Result Value Ref Range   Glucose-Capillary 190 (H) 70 - 99 mg/dL    Comment: Glucose reference range applies only to samples taken after fasting for at least 8 hours.  Glucose,  capillary     Status: Abnormal   Collection Time: 05/06/22  1:55 AM  Result Value Ref Range   Glucose-Capillary 106 (H) 70 - 99 mg/dL    Comment: Glucose reference range applies only to samples taken after fasting for at least 8 hours.  Beta-hydroxybutyric acid     Status: None   Collection Time: 05/06/22  1:55 AM  Result Value Ref Range   Beta-Hydroxybutyric Acid 0.06 0.05 - 0.27 mmol/L    Comment: Performed at [facility], [address], [place], [place] [address]  Magnesium     Status: Abnormal   Collection Time: 05/06/22  1:55 AM  Result Value Ref Range   Magnesium 11.2 (HH) 1.7 - 2.4 mg/dL    Comment: RESULTS CONFIRMED BY MANUAL DILUTION CRITICAL RESULT CALLED TO, READ BACK BY AND VERIFIED WITH: Ladonna Snide, RN 06.14.23 [phone] M.RIVET Performed at [facility], [address], [place], [place] [address]   Phosphorus     Status: Abnormal   Collection Time: 05/06/22  1:55 AM  Result Value Ref Range   Phosphorus 1.3 (L) 2.5 - 4.6 mg/dL    Comment: Performed at [facility], [address], [place], [place] [address]  Glucose, capillary     Status: Abnormal   Collection Time: 05/06/22  2:32 AM  Result Value Ref Range   Glucose-Capillary 208 (H) 70 - 99 mg/dL    Comment: Glucose reference range applies only to samples taken after fasting for at least 8 hours.  Glucose, capillary     Status: Abnormal   Collection Time: 05/06/22  3:03 AM  Result Value Ref Range   Glucose-Capillary 200 (H) 70 - 99 mg/dL    Comment: Glucose reference range applies only to samples taken after fasting for at least 8 hours.  Glucose, capillary     Status: Abnormal   Collection Time: 05/06/22  4:08 AM  Result Value Ref Range   Glucose-Capillary 175 (H) 70 - 99 mg/dL    Comment: Glucose reference range applies only to samples taken after fasting for at least 8 hours.  Magnesium     Status: Abnormal   Collection Time: 05/06/22  4:40 AM  Result Value Ref Range   Magnesium 4.8 (H) 1.7 -  2.4 mg/dL    Comment: Performed at [facility], [address], [place], [place] [address]  Glucose, capillary     Status: Abnormal   Collection Time: 05/06/22  5:17 AM  Result Value Ref Range   Glucose-Capillary 101 (H) 70 - 99 mg/dL    Comment: Glucose reference range applies only to samples taken after fasting  for at least 8 hours.  Glucose, capillary     Status: Abnormal   Collection Time: 05/06/22  6:20 AM  Result Value Ref Range   Glucose-Capillary 130 (H) 70 - 99 mg/dL    Comment: Glucose reference range applies only to samples taken after fasting for at least 8 hours.  Glucose, capillary     Status: Abnormal   Collection Time: 05/06/22  7:46 AM  Result Value Ref Range   Glucose-Capillary 126 (H) 70 - 99 mg/dL    Comment: Glucose reference range applies only to samples taken after fasting for at least 8 hours.  Glucose, capillary     Status: Abnormal   Collection Time: 05/06/22  8:48 AM  Result Value Ref Range   Glucose-Capillary 130 (H) 70 - 99 mg/dL    Comment: Glucose reference range applies only to samples taken after fasting for at least 8 hours.  Glucose, capillary     Status: Abnormal   Collection Time: 05/06/22 10:20 AM  Result Value Ref Range   Glucose-Capillary 121 (H) 70 - 99 mg/dL    Comment: Glucose reference range applies only to samples taken after fasting for at least 8 hours.  Glucose, capillary     Status: Abnormal   Collection Time: 05/06/22 11:18 AM  Result Value Ref Range   Glucose-Capillary 109 (H) 70 - 99 mg/dL    Comment: Glucose reference range applies only to samples taken after fasting for at least 8 hours.  Glucose, capillary     Status: Abnormal   Collection Time: 05/06/22 12:26 PM  Result Value Ref Range   Glucose-Capillary 178 (H) 70 - 99 mg/dL    Comment: Glucose reference range applies only to samples taken after fasting for at least 8 hours.  Glucose, capillary     Status: Abnormal   Collection Time: 05/06/22  1:31 PM  Result  Value Ref Range   Glucose-Capillary 189 (H) 70 - 99 mg/dL    Comment: Glucose reference range applies only to samples taken after fasting for at least 8 hours.  Glucose, capillary     Status: Abnormal   Collection Time: 05/06/22  2:34 PM  Result Value Ref Range   Glucose-Capillary 167 (H) 70 - 99 mg/dL    Comment: Glucose reference range applies only to samples taken after fasting for at least 8 hours.  Glucose, capillary     Status: Abnormal   Collection Time: 05/06/22  3:34 PM  Result Value Ref Range   Glucose-Capillary 118 (H) 70 - 99 mg/dL    Comment: Glucose reference range applies only to samples taken after fasting for at least 8 hours.  Glucose, capillary     Status: Abnormal   Collection Time: 05/06/22  4:33 PM  Result Value Ref Range   Glucose-Capillary 144 (H) 70 - 99 mg/dL    Comment: Glucose reference range applies only to samples taken after fasting for at least 8 hours.  Glucose, capillary     Status: Abnormal   Collection Time: 05/06/22  5:30 PM  Result Value Ref Range   Glucose-Capillary 159 (H) 70 - 99 mg/dL    Comment: Glucose reference range applies only to samples taken after fasting for at least 8 hours.  Glucose, capillary     Status: Abnormal   Collection Time: 05/06/22  6:30 PM  Result Value Ref Range   Glucose-Capillary 142 (H) 70 - 99 mg/dL    Comment: Glucose reference range applies only to samples taken after fasting for at  least 8 hours.  Glucose, capillary     Status: Abnormal   Collection Time: 05/06/22  7:47 PM  Result Value Ref Range   Glucose-Capillary 198 (H) 70 - 99 mg/dL    Comment: Glucose reference range applies only to samples taken after fasting for at least 8 hours.  Glucose, capillary     Status: Abnormal   Collection Time: 05/06/22  8:54 PM  Result Value Ref Range   Glucose-Capillary 180 (H) 70 - 99 mg/dL    Comment: Glucose reference range applies only to samples taken after fasting for at least 8 hours.  Glucose, capillary      Status: Abnormal   Collection Time: 05/06/22  9:48 PM  Result Value Ref Range   Glucose-Capillary 154 (H) 70 - 99 mg/dL    Comment: Glucose reference range applies only to samples taken after fasting for at least 8 hours.  Glucose, capillary     Status: Abnormal   Collection Time: 05/06/22 10:48 PM  Result Value Ref Range   Glucose-Capillary 184 (H) 70 - 99 mg/dL    Comment: Glucose reference range applies only to samples taken after fasting for at least 8 hours.  Glucose, capillary     Status: Abnormal   Collection Time: 05/06/22 11:51 PM  Result Value Ref Range   Glucose-Capillary 163 (H) 70 - 99 mg/dL    Comment: Glucose reference range applies only to samples taken after fasting for at least 8 hours.  Glucose, capillary     Status: Abnormal   Collection Time: 05/07/22 12:59 AM  Result Value Ref Range   Glucose-Capillary 126 (H) 70 - 99 mg/dL    Comment: Glucose reference range applies only to samples taken after fasting for at least 8 hours.  Glucose, capillary     Status: None   Collection Time: 05/07/22  2:02 AM  Result Value Ref Range   Glucose-Capillary 98 70 - 99 mg/dL    Comment: Glucose reference range applies only to samples taken after fasting for at least 8 hours.  Glucose, capillary     Status: Abnormal   Collection Time: 05/07/22  3:08 AM  Result Value Ref Range   Glucose-Capillary 152 (H) 70 - 99 mg/dL    Comment: Glucose reference range applies only to samples taken after fasting for at least 8 hours.  Glucose, capillary     Status: Abnormal   Collection Time: 05/07/22  4:09 AM  Result Value Ref Range   Glucose-Capillary 193 (H) 70 - 99 mg/dL    Comment: Glucose reference range applies only to samples taken after fasting for at least 8 hours.  Glucose, capillary     Status: Abnormal   Collection Time: 05/07/22  5:12 AM  Result Value Ref Range   Glucose-Capillary 258 (H) 70 - 99 mg/dL    Comment: Glucose reference range applies only to samples taken after  fasting for at least 8 hours.  Glucose, capillary     Status: Abnormal   Collection Time: 05/07/22  5:52 AM  Result Value Ref Range   Glucose-Capillary 155 (H) 70 - 99 mg/dL    Comment: Glucose reference range applies only to samples taken after fasting for at least 8 hours.  Glucose, capillary     Status: Abnormal   Collection Time: 05/07/22  6:59 AM  Result Value Ref Range   Glucose-Capillary 168 (H) 70 - 99 mg/dL    Comment: Glucose reference range applies only to samples taken after fasting for at least 8  hours.  Glucose, capillary     Status: Abnormal   Collection Time: 05/07/22  7:54 AM  Result Value Ref Range   Glucose-Capillary 143 (H) 70 - 99 mg/dL    Comment: Glucose reference range applies only to samples taken after fasting for at least 8 hours.  Glucose, capillary     Status: Abnormal   Collection Time: 05/07/22  8:57 AM  Result Value Ref Range   Glucose-Capillary 119 (H) 70 - 99 mg/dL    Comment: Glucose reference range applies only to samples taken after fasting for at least 8 hours.  Glucose, capillary     Status: Abnormal   Collection Time: 05/07/22 10:01 AM  Result Value Ref Range   Glucose-Capillary 174 (H) 70 - 99 mg/dL    Comment: Glucose reference range applies only to samples taken after fasting for at least 8 hours.  Glucose, capillary     Status: Abnormal   Collection Time: 05/07/22 10:31 AM  Result Value Ref Range   Glucose-Capillary 209 (H) 70 - 99 mg/dL    Comment: Glucose reference range applies only to samples taken after fasting for at least 8 hours.  Glucose, capillary     Status: Abnormal   Collection Time: 05/07/22 11:01 AM  Result Value Ref Range   Glucose-Capillary 204 (H) 70 - 99 mg/dL    Comment: Glucose reference range applies only to samples taken after fasting for at least 8 hours.  Glucose, capillary     Status: Abnormal   Collection Time: 05/07/22 11:30 AM  Result Value Ref Range   Glucose-Capillary 182 (H) 70 - 99 mg/dL    Comment:  Glucose reference range applies only to samples taken after fasting for at least 8 hours.  Glucose, capillary     Status: Abnormal   Collection Time: 05/07/22 12:33 PM  Result Value Ref Range   Glucose-Capillary 136 (H) 70 - 99 mg/dL    Comment: Glucose reference range applies only to samples taken after fasting for at least 8 hours.  Glucose, capillary     Status: None   Collection Time: 05/07/22  1:30 PM  Result Value Ref Range   Glucose-Capillary 85 70 - 99 mg/dL    Comment: Glucose reference range applies only to samples taken after fasting for at least 8 hours.  Glucose, capillary     Status: Abnormal   Collection Time: 05/07/22  2:08 PM  Result Value Ref Range   Glucose-Capillary 64 (L) 70 - 99 mg/dL    Comment: Glucose reference range applies only to samples taken after fasting for at least 8 hours.  Glucose, capillary     Status: Abnormal   Collection Time: 05/07/22  2:42 PM  Result Value Ref Range   Glucose-Capillary 150 (H) 70 - 99 mg/dL    Comment: Glucose reference range applies only to samples taken after fasting for at least 8 hours.  Glucose, capillary     Status: Abnormal   Collection Time: 05/07/22  3:40 PM  Result Value Ref Range   Glucose-Capillary 163 (H) 70 - 99 mg/dL    Comment: Glucose reference range applies only to samples taken after fasting for at least 8 hours.  Glucose, capillary     Status: Abnormal   Collection Time: 05/07/22  4:47 PM  Result Value Ref Range   Glucose-Capillary 148 (H) 70 - 99 mg/dL    Comment: Glucose reference range applies only to samples taken after fasting for at least 8 hours.    No  results found.  Current scheduled medications  [START ON 05/08/2022] amoxicillin  500 mg Oral TID   labetalol  400 mg Oral TID   polyethylene glycol  17 g Oral BID    I have reviewed the patient's current medications.  ASSESSMENT: Principal Problem:   Preterm premature rupture of membranes Active Problems:   Female circumcision   Language  barrier   Type 1 diabetes mellitus with ketoacidosis, uncontrolled (HCC)   Supervision of high risk pregnancy, antepartum   Rh negative, antepartum   Chronic hypertension affecting pregnancy   Short interval between pregnancies affecting pregnancy, antepartum   Previous preterm delivery at 33w6 due to severe preeclampsia, antepartum   History of forceps delivery in prior pregnancy, currently pregnant   PLAN: Miralax ordered for constipation, likely source of her pain Continue Endotool for now as per DM coordinator recommendations, appreciate their input.  Will make sure patient can get CGM soon. Continue latency antibiotics, she has received BMZ x2, has gotten NICU consult Reassuring FHR tracing, continue NST bid. Continue Labetalol for CHTN, can titrate up as needed. May need to restart home Procardia.  Continue routine antenatal care.   Jaynie Collins, MD, FACOG Obstetrician & Gynecologist, [facility] for [employer], [facility]

## 2022-05-08 ENCOUNTER — Ambulatory Visit: Payer: Medicaid Other

## 2022-05-08 ENCOUNTER — Other Ambulatory Visit: Payer: Medicaid Other

## 2022-05-08 DIAGNOSIS — O42113 Preterm premature rupture of membranes, onset of labor more than 24 hours following rupture, third trimester: Secondary | ICD-10-CM

## 2022-05-08 DIAGNOSIS — E1010 Type 1 diabetes mellitus with ketoacidosis without coma: Secondary | ICD-10-CM

## 2022-05-08 DIAGNOSIS — O09219 Supervision of pregnancy with history of pre-term labor, unspecified trimester: Secondary | ICD-10-CM | POA: Diagnosis not present

## 2022-05-08 DIAGNOSIS — O10919 Unspecified pre-existing hypertension complicating pregnancy, unspecified trimester: Secondary | ICD-10-CM | POA: Diagnosis not present

## 2022-05-08 LAB — CBC WITH DIFFERENTIAL/PLATELET
Abs Immature Granulocytes: 0.04 10*3/uL (ref 0.00–0.07)
Basophils Absolute: 0 10*3/uL (ref 0.0–0.1)
Basophils Relative: 0 %
Eosinophils Absolute: 0 10*3/uL (ref 0.0–0.5)
Eosinophils Relative: 0 %
HCT: 31.8 % — ABNORMAL LOW (ref 36.0–46.0)
Hemoglobin: 10.7 g/dL — ABNORMAL LOW (ref 12.0–15.0)
Immature Granulocytes: 1 %
Lymphocytes Relative: 17 %
Lymphs Abs: 1 10*3/uL (ref 0.7–4.0)
MCH: 29.2 pg (ref 26.0–34.0)
MCHC: 33.6 g/dL (ref 30.0–36.0)
MCV: 86.6 fL (ref 80.0–100.0)
Monocytes Absolute: 0.4 10*3/uL (ref 0.1–1.0)
Monocytes Relative: 7 %
Neutro Abs: 4.6 10*3/uL (ref 1.7–7.7)
Neutrophils Relative %: 75 %
Platelets: 199 10*3/uL (ref 150–400)
RBC: 3.67 MIL/uL — ABNORMAL LOW (ref 3.87–5.11)
RDW: 14.2 % (ref 11.5–15.5)
WBC: 6.1 10*3/uL (ref 4.0–10.5)
nRBC: 0 % (ref 0.0–0.2)

## 2022-05-08 LAB — GLUCOSE, CAPILLARY
Glucose-Capillary: 88 mg/dL (ref 70–99)
Glucose-Capillary: 89 mg/dL (ref 70–99)
Glucose-Capillary: 114 mg/dL — ABNORMAL HIGH (ref 70–99)
Glucose-Capillary: 116 mg/dL — ABNORMAL HIGH (ref 70–99)
Glucose-Capillary: 123 mg/dL — ABNORMAL HIGH (ref 70–99)
Glucose-Capillary: 83 mg/dL (ref 70–99)
Glucose-Capillary: 87 mg/dL (ref 70–99)
Glucose-Capillary: 107 mg/dL — ABNORMAL HIGH (ref 70–99)

## 2022-05-08 LAB — COMPREHENSIVE METABOLIC PANEL
ALT: 9 U/L (ref 0–44)
AST: <5 U/L — ABNORMAL LOW (ref 15–41)
Albumin: 2.1 g/dL — ABNORMAL LOW (ref 3.5–5.0)
Alkaline Phosphatase: 62 U/L (ref 38–126)
Anion gap: 9 (ref 5–15)
BUN: <5 mg/dL — ABNORMAL LOW (ref 6–20)
CO2: 22 mmol/L (ref 22–32)
Calcium: 8.2 mg/dL — ABNORMAL LOW (ref 8.9–10.3)
Chloride: 107 mmol/L (ref 98–111)
Creatinine, Ser: 0.34 mg/dL — ABNORMAL LOW (ref 0.44–1.00)
GFR, Estimated: >60 mL/min (ref >60)
Glucose, Bld: 70 mg/dL (ref 70–99)
Potassium: 2.9 mmol/L — ABNORMAL LOW (ref 3.5–5.1)
Sodium: 138 mmol/L (ref 135–145)
Total Bilirubin: 0.6 mg/dL (ref 0.3–1.2)
Total Protein: 5.4 g/dL — ABNORMAL LOW (ref 6.5–8.1)

## 2022-05-08 LAB — TYPE AND SCREEN
ABO/RH(D): A NEG
Antibody Screen: POSITIVE

## 2022-05-08 MED ORDER — INSULIN NPH (HUMAN) (ISOPHANE) 100 UNIT/ML ~~LOC~~ SUSP
20.0000 [IU] | Freq: Every day | SUBCUTANEOUS | Status: DC
  Administered 2022-05-08 – 2022-05-10 (×3): 20 [IU] via SUBCUTANEOUS
  Filled 2022-05-08: qty 10

## 2022-05-08 MED ORDER — INSULIN ASPART 100 UNIT/ML IJ SOLN
15.0000 [IU] | Freq: Three times a day (TID) | INTRAMUSCULAR | Status: DC
Start: 2022-05-08 — End: 2022-05-08
  Administered 2022-05-08 (×2): 15 [IU] via SUBCUTANEOUS

## 2022-05-08 MED ORDER — POTASSIUM CHLORIDE 10 MEQ/100ML IV SOLN
10.0000 meq | INTRAVENOUS | Status: AC
  Administered 2022-05-08 (×4): 10 meq via INTRAVENOUS
  Filled 2022-05-08 (×4): qty 100

## 2022-05-08 MED ORDER — INSULIN ASPART 100 UNIT/ML IJ SOLN
0.0000 [IU] | Freq: Three times a day (TID) | INTRAMUSCULAR | Status: DC
  Administered 2022-05-08: 1 [IU] via SUBCUTANEOUS
  Administered 2022-05-08: 3 [IU] via SUBCUTANEOUS
  Administered 2022-05-09: 1 [IU] via SUBCUTANEOUS
  Administered 2022-05-09 – 2022-05-10 (×4): 2 [IU] via SUBCUTANEOUS
  Administered 2022-05-11: 3 [IU] via SUBCUTANEOUS
  Administered 2022-05-11: 2 [IU] via SUBCUTANEOUS
  Administered 2022-05-12: 1 [IU] via SUBCUTANEOUS
  Administered 2022-05-12: 2 [IU] via SUBCUTANEOUS
  Administered 2022-05-13: 1 [IU] via SUBCUTANEOUS
  Administered 2022-05-13: 3 [IU] via SUBCUTANEOUS
  Administered 2022-05-13: 1 [IU] via SUBCUTANEOUS
  Administered 2022-05-14: 2 [IU] via SUBCUTANEOUS
  Administered 2022-05-15: 3 [IU] via SUBCUTANEOUS
  Administered 2022-05-15: 2 [IU] via SUBCUTANEOUS
  Administered 2022-05-15: 1 [IU] via SUBCUTANEOUS

## 2022-05-08 MED ORDER — INSULIN ASPART 100 UNIT/ML IJ SOLN
3.0000 [IU] | Freq: Three times a day (TID) | INTRAMUSCULAR | Status: DC
  Administered 2022-05-09 – 2022-05-11 (×7): 3 [IU] via SUBCUTANEOUS

## 2022-05-08 MED ORDER — POTASSIUM CHLORIDE 10 MEQ/100ML IV SOLN
10.0000 meq | INTRAVENOUS | Status: AC
  Administered 2022-05-08 – 2022-05-09 (×2): 10 meq via INTRAVENOUS
  Filled 2022-05-08: qty 100

## 2022-05-08 MED ORDER — INSULIN NPH (HUMAN) (ISOPHANE) 100 UNIT/ML ~~LOC~~ SUSP
25.0000 [IU] | Freq: Every day | SUBCUTANEOUS | Status: DC
  Administered 2022-05-08 – 2022-05-16 (×9): 25 [IU] via SUBCUTANEOUS

## 2022-05-08 NOTE — Progress Notes (Signed)
FACULTY PRACTICE ANTEPARTUM(COMPREHENSIVE) NOTE  Kristin Clay is a 36 y.o. [phone] with Estimated Date of Delivery: 07/03/22   By  midtrimester ultrasound [redacted]w[redacted]d  who is admitted for PPROM with PMHx significant for [facility] and Type1DM.    Fetal presentation is cephalic. Length of Stay:  3  Days  Date of admission:05/05/2022  Subjective: Resting comfortably.  She did have BM yesterday, but feels like she "has more."  Notes mild headache this am, no blurry vision, no RUQ pain. Patient reports the fetal movement as decreased movement this am. Patient reports uterine contraction  activity as none. Patient reports  vaginal bleeding as none. Patient describes fluid per vagina as Clear.  Vitals:  Blood pressure 127/74, pulse (!) 105, temperature 97.8 F (36.6 C), temperature source Oral, resp. rate 17, last menstrual period 09/20/2021, SpO2 100 %, not currently breastfeeding. Vitals:   05/07/22 1534 05/07/22 1703 05/07/22 1948 05/07/22 2341  BP: (!) 156/89 (!) 150/76 137/80 127/74  Pulse: (!) 101 (!) 112 94 (!) 105  Resp: 18  16 17   Temp: 98.5 F (36.9 C)  98.3 F (36.8 C) 97.8 F (36.6 C)  TempSrc: Oral  Oral Oral  SpO2: 100%  100% 100%   Physical Examination:  General appearance - alert, well appearing, and in no distress Mental status - normal mood, behavior, speech, dress, motor activity, and thought processes Chest - clear to auscultation, no wheezes, rales or rhonchi, symmetric air entry Heart - normal rate and regular rhythm Abdomen - gravid, soft and non-tendder Extremities - no edema, no calf tenderness bilaterally Skin - warm and dry   Fetal Monitoring:  Baseline: 145 bpm, Variability: moderate, Accelerations: +10x10 accels, and Decelerations: Absent    reactive  Labs:  Results for orders placed or performed during the hospital encounter of 05/05/22 (from the past 24 hour(s))  Glucose, capillary   Collection Time: 05/07/22  7:54 AM  Result Value Ref Range    Glucose-Capillary 143 (H) 70 - 99 mg/dL  Glucose, capillary   Collection Time: 05/07/22  8:57 AM  Result Value Ref Range   Glucose-Capillary 119 (H) 70 - 99 mg/dL  Glucose, capillary   Collection Time: 05/07/22 10:01 AM  Result Value Ref Range   Glucose-Capillary 174 (H) 70 - 99 mg/dL  Glucose, capillary   Collection Time: 05/07/22 10:31 AM  Result Value Ref Range   Glucose-Capillary 209 (H) 70 - 99 mg/dL  Glucose, capillary   Collection Time: 05/07/22 11:01 AM  Result Value Ref Range   Glucose-Capillary 204 (H) 70 - 99 mg/dL  Glucose, capillary   Collection Time: 05/07/22 11:30 AM  Result Value Ref Range   Glucose-Capillary 182 (H) 70 - 99 mg/dL  Glucose, capillary   Collection Time: 05/07/22 12:33 PM  Result Value Ref Range   Glucose-Capillary 136 (H) 70 - 99 mg/dL  Glucose, capillary   Collection Time: 05/07/22  1:30 PM  Result Value Ref Range   Glucose-Capillary 85 70 - 99 mg/dL  Glucose, capillary   Collection Time: 05/07/22  2:08 PM  Result Value Ref Range   Glucose-Capillary 64 (L) 70 - 99 mg/dL  Glucose, capillary   Collection Time: 05/07/22  2:42 PM  Result Value Ref Range   Glucose-Capillary 150 (H) 70 - 99 mg/dL  Glucose, capillary   Collection Time: 05/07/22  3:40 PM  Result Value Ref Range   Glucose-Capillary 163 (H) 70 - 99 mg/dL  Glucose, capillary   Collection Time: 05/07/22  4:47 PM  Result Value  Ref Range   Glucose-Capillary 148 (H) 70 - 99 mg/dL  Glucose, capillary   Collection Time: 05/07/22  5:47 PM  Result Value Ref Range   Glucose-Capillary 93 70 - 99 mg/dL  Glucose, capillary   Collection Time: 05/07/22  6:19 PM  Result Value Ref Range   Glucose-Capillary 93 70 - 99 mg/dL  Glucose, capillary   Collection Time: 05/07/22  7:22 PM  Result Value Ref Range   Glucose-Capillary 102 (H) 70 - 99 mg/dL  Glucose, capillary   Collection Time: 05/07/22  8:25 PM  Result Value Ref Range   Glucose-Capillary 157 (H) 70 - 99 mg/dL  Glucose, capillary    Collection Time: 05/07/22  9:45 PM  Result Value Ref Range   Glucose-Capillary 100 (H) 70 - 99 mg/dL  Glucose, capillary   Collection Time: 05/07/22 10:49 PM  Result Value Ref Range   Glucose-Capillary 88 70 - 99 mg/dL  Glucose, capillary   Collection Time: 05/07/22 11:52 PM  Result Value Ref Range   Glucose-Capillary 78 70 - 99 mg/dL  Glucose, capillary   Collection Time: 05/08/22  1:04 AM  Result Value Ref Range   Glucose-Capillary 88 70 - 99 mg/dL  Glucose, capillary   Collection Time: 05/08/22  1:50 AM  Result Value Ref Range   Glucose-Capillary 89 70 - 99 mg/dL  Glucose, capillary   Collection Time: 05/08/22  2:57 AM  Result Value Ref Range   Glucose-Capillary 114 (H) 70 - 99 mg/dL  Glucose, capillary   Collection Time: 05/08/22  4:05 AM  Result Value Ref Range   Glucose-Capillary 116 (H) 70 - 99 mg/dL  Glucose, capillary   Collection Time: 05/08/22  5:08 AM  Result Value Ref Range   Glucose-Capillary 123 (H) 70 - 99 mg/dL  Glucose, capillary   Collection Time: 05/08/22  6:16 AM  Result Value Ref Range   Glucose-Capillary 83 70 - 99 mg/dL    Imaging Studies:    No results found.   Medications:  Scheduled  amoxicillin  500 mg Oral TID   insulin aspart  0-14 Units Subcutaneous TID PC   insulin aspart  15 Units Subcutaneous TID WC   insulin NPH Human  20 Units Subcutaneous QHS   insulin NPH Human  25 Units Subcutaneous QAC breakfast   labetalol  400 mg Oral TID   NIFEdipine  90 mg Oral Daily   polyethylene glycol  17 g Oral BID   I have reviewed the patient's current medications.  ASSESSMENT: [id number] [redacted]w[redacted]d Estimated Date of Delivery: 07/03/22  PPROM Type 1 DM Chronic HTN Supervision of high risk pregnancy Prior preterm delivery complicated by preeclampsia H/o forcep delivery  PLAN: 1) FWB -FHT reassuring, continue NST twice daily -growth q 3-4wks  2) PPROM -s/p BMZ course -on latency antibiotics -s/p NICU consult  3) Type 1DM -transitioned  off Endotool -appreciate DM recommendations -started on Novolin N 25am 20 pm and Regular 15 with meals -continue fasting and 2hr PP  4) chronic HTN -a few elevated BP yesterday, but improved this am -will continue with Labetalol 400mg  tid and Procardia 90mg  daily -last labs 6/13, continue weekly -tylenol this am for headache and will continue to monitor  5) Maternal care -BM yesterday, continue miralax -continue routine antenatal care  DISP: In-house care as outlined above  Sia Gabrielsen 05/08/2022,7:17 AM

## 2022-05-08 NOTE — Progress Notes (Signed)
Inpatient Diabetes Program Recommendations  Diabetes Treatment Program Recommendations  ADA Standards of Care Diabetes in Pregnancy Target Glucose Ranges:  Fasting: 70 - 95 mg/dL 1 hr postprandial: Less than 140mg /dL (from first bite of meal) 2 hr postprandial: Less than 120 mg/dL (from first bite of meal)    Lab Results  Component Value Date   GLUCAP 87 05/08/2022   HGBA1C 7.4 (A) 04/28/2022   Review of Glycemic Control  Latest Reference Range & Units 05/08/22 04:05 05/08/22 05:08 05/08/22 06:16 05/08/22 11:14  Glucose-Capillary 70 - 99 mg/dL 05/10/22 (H) 937 (H) 83 87  Diabetes history: DM 1 Outpatient Diabetes medications:  Lantus 25 units in the AM and Lantus 20 units q HS Novolog 15 units tid with meals (plus post-prandial correction) Current orders for Inpatient glycemic control:  Novolog 0-14 units TID, Novolog 15 units TID, NPH 25 units qa, 20 units QHS,  BMZ x 2(2)   Inpatient Diabetes Program Recommendations:   Anticipate patient to have lows with Novolog 15 units TID. Consider decreasing to Novolog 3 units TID (assuming patient is consuming >50% of meal). Reviewed importance of meal consumption with nursing staff.  Spoke with patient at bedside. Placed Freestyle libre on left upper arm. Reviewed how to use, length of time for readings and the need to verify with fingersticks Q shift. Patient understands and has no further questions at this time.   Thanks, 902, MSN, RNC-OB Diabetes Coordinator [phone] (8a-5p)

## 2022-05-09 DIAGNOSIS — O10919 Unspecified pre-existing hypertension complicating pregnancy, unspecified trimester: Secondary | ICD-10-CM | POA: Diagnosis not present

## 2022-05-09 DIAGNOSIS — O42113 Preterm premature rupture of membranes, onset of labor more than 24 hours following rupture, third trimester: Secondary | ICD-10-CM | POA: Diagnosis not present

## 2022-05-09 LAB — BASIC METABOLIC PANEL
Anion gap: 11 (ref 5–15)
BUN: <5 mg/dL — ABNORMAL LOW (ref 6–20)
CO2: 20 mmol/L — ABNORMAL LOW (ref 22–32)
Calcium: 8 mg/dL — ABNORMAL LOW (ref 8.9–10.3)
Chloride: 104 mmol/L (ref 98–111)
Creatinine, Ser: 0.51 mg/dL (ref 0.44–1.00)
GFR, Estimated: >60 mL/min (ref >60)
Glucose, Bld: 129 mg/dL — ABNORMAL HIGH (ref 70–99)
Potassium: 3.6 mmol/L (ref 3.5–5.1)
Sodium: 135 mmol/L (ref 135–145)

## 2022-05-09 LAB — GLUCOSE, CAPILLARY
Glucose-Capillary: 86 mg/dL (ref 70–99)
Glucose-Capillary: 76 mg/dL (ref 70–99)
Glucose-Capillary: 148 mg/dL — ABNORMAL HIGH (ref 70–99)

## 2022-05-09 NOTE — Progress Notes (Signed)
Fasting CBG 177. Patient reported eating a [place] sandwhich and a few graham crackers in the middle of the night.

## 2022-05-09 NOTE — Progress Notes (Signed)
FACULTY PRACTICE ANTEPARTUM COMPREHENSIVE PROGRESS NOTE  Kristin Clay is a 36 y.o. [phone] at [redacted]w[redacted]d who is admitted for PPROM in the setting of known T1DM and CHTN.  Estimated Date of Delivery: 07/03/22 Fetal presentation is cephalic.  Length of Stay:  4 Days. Admitted 05/05/2022  Subjective: (Arabic interpreter used for encounter) Patient denies any concerns Patient reports good fetal movement.  She reports no uterine contractions, no bleeding and no loss of fluid per vagina.  Vitals:  Blood pressure 119/78, pulse 96, temperature 98.2 F (36.8 C), temperature source Oral, resp. rate 16, last menstrual period 09/20/2021, SpO2 100 %, not currently breastfeeding. Physical Examination: CONSTITUTIONAL: Well-developed, well-nourished female in no acute distress.  NEUROLOGIC: Alert and oriented to person, place, and time. No cranial nerve deficit noted. PSYCHIATRIC: Normal mood and affect. Normal behavior. Normal judgment and thought content. CARDIOVASCULAR: Normal heart rate noted, regular rhythm RESPIRATORY: Effort and breath sounds normal, no problems with respiration noted MUSCULOSKELETAL: Normal range of motion. 1-2+ BLE symmetric edema and no tenderness. 2+ distal pulses. Negativ ABDOMEN: Soft, nontender, nondistended, gravid. CERVIX: Dilation: 1.5 Effacement (%): 50 Station: -3 (-3, -4) Presentation: Vertex Exam by:: Dr. Macon Large Exam done 05/07/2022  Fetal monitoring: FHR: 140 bpm, Variability: moderate, Accelerations: Present, Decelerations: Absent  Uterine activity: Flat   Results for orders placed or performed during the hospital encounter of 05/05/22 (from the past 24 hour(s))  Glucose, capillary     Status: None   Collection Time: 05/08/22 11:14 AM  Result Value Ref Range   Glucose-Capillary 87 70 - 99 mg/dL  Type and screen [facility]     Status: None   Collection Time: 05/08/22 12:31 PM  Result Value Ref Range   ABO/RH(D) A NEG    Antibody  Screen POS    Sample Expiration 05/11/2022,2359    Antibody Identification      PASSIVELY ACQUIRED ANTI-D Performed at [facility], [address] [place], [place] [address]   CBC with Differential/Platelet     Status: Abnormal   Collection Time: 05/08/22 12:31 PM  Result Value Ref Range   WBC 6.1 4.0 - 10.5 K/uL   RBC 3.67 (L) 3.87 - 5.11 MIL/uL   Hemoglobin 10.7 (L) 12.0 - 15.0 g/dL   HCT 49.7 (L) 02.6 - 37.8 %   MCV 86.6 80.0 - 100.0 fL   MCH 29.2 26.0 - 34.0 pg   MCHC 33.6 30.0 - 36.0 g/dL   RDW 58.8 50.2 - 77.4 %   Platelets 199 150 - 400 K/uL   nRBC 0.0 0.0 - 0.2 %   Neutrophils Relative % 75 %   Neutro Abs 4.6 1.7 - 7.7 K/uL   Lymphocytes Relative 17 %   Lymphs Abs 1.0 0.7 - 4.0 K/uL   Monocytes Relative 7 %   Monocytes Absolute 0.4 0.1 - 1.0 K/uL   Eosinophils Relative 0 %   Eosinophils Absolute 0.0 0.0 - 0.5 K/uL   Basophils Relative 0 %   Basophils Absolute 0.0 0.0 - 0.1 K/uL   Immature Granulocytes 1 %   Abs Immature Granulocytes 0.04 0.00 - 0.07 K/uL  Comprehensive metabolic panel     Status: Abnormal   Collection Time: 05/08/22 12:31 PM  Result Value Ref Range   Sodium 138 135 - 145 mmol/L   Potassium 2.9 (L) 3.5 - 5.1 mmol/L   Chloride 107 98 - 111 mmol/L   CO2 22 22 - 32 mmol/L   Glucose, Bld 70 70 - 99  mg/dL   BUN <5 (L) 6 - 20 mg/dL   Creatinine, Ser 0.10 (L) 0.44 - 1.00 mg/dL   Calcium 8.2 (L) 8.9 - 10.3 mg/dL   Total Protein 5.4 (L) 6.5 - 8.1 g/dL   Albumin 2.1 (L) 3.5 - 5.0 g/dL   AST <5 (L) 15 - 41 U/L   ALT 9 0 - 44 U/L   Alkaline Phosphatase 62 38 - 126 U/L   Total Bilirubin 0.6 0.3 - 1.2 mg/dL   GFR, Estimated >93 >23 mL/min   Anion gap 9 5 - 15  Glucose, capillary     Status: Abnormal   Collection Time: 05/08/22  3:57 PM  Result Value Ref Range   Glucose-Capillary 107 (H) 70 - 99 mg/dL  Basic metabolic panel     Status: Abnormal   Collection Time: 05/09/22  4:21 AM  Result Value Ref Range   Sodium 135 135 - 145 mmol/L    Potassium 3.6 3.5 - 5.1 mmol/L   Chloride 104 98 - 111 mmol/L   CO2 20 (L) 22 - 32 mmol/L   Glucose, Bld 129 (H) 70 - 99 mg/dL   BUN <5 (L) 6 - 20 mg/dL   Creatinine, Ser 5.57 0.44 - 1.00 mg/dL   Calcium 8.0 (L) 8.9 - 10.3 mg/dL   GFR, Estimated >32 >20 mL/min   Anion gap 11 5 - 15  Glucose, capillary     Status: None   Collection Time: 05/09/22 10:25 AM  Result Value Ref Range   Glucose-Capillary 86 70 - 99 mg/dL      No results found.  Current scheduled medications  amoxicillin  500 mg Oral TID   insulin aspart  0-14 Units Subcutaneous TID PC   insulin aspart  3 Units Subcutaneous TID WC   insulin NPH Human  20 Units Subcutaneous QHS   insulin NPH Human  25 Units Subcutaneous QAC breakfast   labetalol  400 mg Oral TID   NIFEdipine  90 mg Oral Daily   polyethylene glycol  17 g Oral BID    I have reviewed the patient's current medications.  ASSESSMENT: Principal Problem:   Preterm premature rupture of membranes (PPROM) with onset of labor after 24 hours of rupture in third trimester, antepartum Active Problems:   Female circumcision   Language barrier   Type 1 diabetes mellitus with ketoacidosis, uncontrolled (HCC)   Supervision of high risk pregnancy, antepartum   Rh negative, antepartum   Chronic hypertension affecting pregnancy   Short interval between pregnancies affecting pregnancy, antepartum   Previous preterm delivery at 33w6 due to severe preeclampsia, antepartum   History of forceps delivery in prior pregnancy, currently pregnant   PLAN: CBGs stable over the last day, continue recommended insulin for now as per DM coordinator recommendations, appreciate their input.  Continue latency antibiotics, she has received BMZ x2, has gotten NICU consult Reassuring FHR tracing, continue NST bid. Continue Procardia and Labetalol for CHTN, can change as needed. Continue routine antenatal care.   Jaynie Collins, MD, FACOG Obstetrician & Gynecologist, [facility] for [employer], [facility]

## 2022-05-10 ENCOUNTER — Other Ambulatory Visit: Payer: Self-pay

## 2022-05-10 DIAGNOSIS — O10919 Unspecified pre-existing hypertension complicating pregnancy, unspecified trimester: Secondary | ICD-10-CM | POA: Diagnosis not present

## 2022-05-10 DIAGNOSIS — O42113 Preterm premature rupture of membranes, onset of labor more than 24 hours following rupture, third trimester: Secondary | ICD-10-CM | POA: Diagnosis not present

## 2022-05-10 LAB — GLUCOSE, CAPILLARY
Glucose-Capillary: 190 mg/dL — ABNORMAL HIGH (ref 70–99)
Glucose-Capillary: 128 mg/dL — ABNORMAL HIGH (ref 70–99)
Glucose-Capillary: 142 mg/dL — ABNORMAL HIGH (ref 70–99)
Glucose-Capillary: 148 mg/dL — ABNORMAL HIGH (ref 70–99)
Glucose-Capillary: 137 mg/dL — ABNORMAL HIGH (ref 70–99)

## 2022-05-10 MED ORDER — LABETALOL HCL 200 MG PO TABS
400.0000 mg | ORAL_TABLET | Freq: Three times a day (TID) | ORAL | Status: DC
  Administered 2022-05-10 – 2022-05-16 (×15): 400 mg via ORAL
  Filled 2022-05-10 (×16): qty 2

## 2022-05-10 NOTE — Progress Notes (Addendum)
FACULTY PRACTICE ANTEPARTUM COMPREHENSIVE PROGRESS NOTE  Kristin Clay is a 36 y.o. [phone] at [redacted]w[redacted]d who is admitted for PPROM in the setting of known T1DM and CHTN.  Estimated Date of Delivery: 07/03/22 Fetal presentation is cephalic.  Length of Stay:  5 Days. Admitted 05/05/2022  Subjective: (AMN Arabic interpreter used for encounter) Patient denies any concerns Patient reports good fetal movement.  She reports no uterine contractions, no bleeding and no loss of fluid per vagina.  Vitals:  Blood pressure 129/74, pulse 92, temperature 98.3 F (36.8 C), temperature source Oral, resp. rate 16, last menstrual period 09/20/2021, SpO2 99 %, not currently breastfeeding. Physical Examination: CONSTITUTIONAL: Well-developed, well-nourished female in no acute distress.  NEUROLOGIC: Alert and oriented to person, place, and time. No cranial nerve deficit noted. PSYCHIATRIC: Normal mood and affect. Normal behavior. Normal judgment and thought content. CARDIOVASCULAR: Normal heart rate noted, regular rhythm RESPIRATORY: Effort and breath sounds normal, no problems with respiration noted MUSCULOSKELETAL: Normal range of motion. Trace BLE symmetric edema and no tenderness. 2+ distal pulses. Negativ ABDOMEN: Soft, nontender, nondistended, gravid. CERVIX: Dilation: 1.5 Effacement (%): 50 Station: -3 (-3, -4) Presentation: Vertex Exam by:: Dr. Macon Large Exam done 05/07/2022  Fetal monitoring: FHR: 130 bpm, Variability: moderate, Accelerations: Present, Decelerations: Absent  Uterine activity: Flat   Results for orders placed or performed during the hospital encounter of 05/05/22 (from the past 24 hour(s))  Glucose, capillary     Status: None   Collection Time: 05/09/22 10:25 AM  Result Value Ref Range   Glucose-Capillary 86 70 - 99 mg/dL  Glucose, capillary     Status: None   Collection Time: 05/09/22  4:36 PM  Result Value Ref Range   Glucose-Capillary 76 70 - 99 mg/dL  Glucose,  capillary     Status: Abnormal   Collection Time: 05/09/22 10:46 PM  Result Value Ref Range   Glucose-Capillary 148 (H) 70 - 99 mg/dL  Glucose, capillary     Status: Abnormal   Collection Time: 05/10/22  6:47 AM  Result Value Ref Range   Glucose-Capillary 190 (H) 70 - 99 mg/dL   No results found.  Current scheduled medications  amoxicillin  500 mg Oral TID   insulin aspart  0-14 Units Subcutaneous TID PC   insulin aspart  3 Units Subcutaneous TID WC   insulin NPH Human  20 Units Subcutaneous QHS   insulin NPH Human  25 Units Subcutaneous QAC breakfast   labetalol  400 mg Oral TID   NIFEdipine  90 mg Oral Daily   polyethylene glycol  17 g Oral BID    I have reviewed the patient's current medications.  ASSESSMENT: Principal Problem:   Preterm premature rupture of membranes (PPROM) with onset of labor after 24 hours of rupture in third trimester, antepartum Active Problems:   Female circumcision   Language barrier   Type 1 diabetes mellitus with ketoacidosis, uncontrolled (HCC)   Supervision of high risk pregnancy, antepartum   Rh negative, antepartum   Chronic hypertension affecting pregnancy   Short interval between pregnancies affecting pregnancy, antepartum   Previous preterm delivery at 33w6 due to severe preeclampsia, antepartum   History of forceps delivery in prior pregnancy, currently pregnant   PLAN: CBGs stable over the last day (not true fasting this morning), continue recommended insulin for now as per DM coordinator recommendations, appreciate their input.  Continue Procardia and Labetalol for CHTN, can change as needed. Continue latency antibiotics, she has received BMZ x2, has gotten NICU consult  Reassuring FHR tracing, continue NST bid. Ordered MFM growth scan and BPP for tomorrow, last growth scan was 03/25/22. Plan to deliver at 34 weeks or earlier if indicated. Continue routine antenatal care.   Jaynie Collins, MD, FACOG Obstetrician & Gynecologist,  [facility] for [employer], [facility]

## 2022-05-11 ENCOUNTER — Inpatient Hospital Stay (HOSPITAL_COMMUNITY): Payer: Medicaid Other

## 2022-05-11 DIAGNOSIS — O10013 Pre-existing essential hypertension complicating pregnancy, third trimester: Secondary | ICD-10-CM

## 2022-05-11 DIAGNOSIS — Z3A32 32 weeks gestation of pregnancy: Secondary | ICD-10-CM | POA: Diagnosis not present

## 2022-05-11 DIAGNOSIS — O42913 Preterm premature rupture of membranes, unspecified as to length of time between rupture and onset of labor, third trimester: Secondary | ICD-10-CM | POA: Diagnosis not present

## 2022-05-11 DIAGNOSIS — O09293 Supervision of pregnancy with other poor reproductive or obstetric history, third trimester: Secondary | ICD-10-CM | POA: Diagnosis not present

## 2022-05-11 DIAGNOSIS — O42113 Preterm premature rupture of membranes, onset of labor more than 24 hours following rupture, third trimester: Secondary | ICD-10-CM | POA: Diagnosis not present

## 2022-05-11 LAB — CBC WITH DIFFERENTIAL/PLATELET
Abs Immature Granulocytes: 0.02 10*3/uL (ref 0.00–0.07)
Basophils Absolute: 0 10*3/uL (ref 0.0–0.1)
Basophils Relative: 0 %
Eosinophils Absolute: 0.1 10*3/uL (ref 0.0–0.5)
Eosinophils Relative: 2 %
HCT: 36.3 % (ref 36.0–46.0)
Hemoglobin: 11.9 g/dL — ABNORMAL LOW (ref 12.0–15.0)
Immature Granulocytes: 1 %
Lymphocytes Relative: 27 %
Lymphs Abs: 1 10*3/uL (ref 0.7–4.0)
MCH: 28.9 pg (ref 26.0–34.0)
MCHC: 32.8 g/dL (ref 30.0–36.0)
MCV: 88.1 fL (ref 80.0–100.0)
Monocytes Absolute: 0.3 10*3/uL (ref 0.1–1.0)
Monocytes Relative: 7 %
Neutro Abs: 2.4 10*3/uL (ref 1.7–7.7)
Neutrophils Relative %: 63 %
Platelets: 222 10*3/uL (ref 150–400)
RBC: 4.12 MIL/uL (ref 3.87–5.11)
RDW: 13.7 % (ref 11.5–15.5)
WBC: 3.7 10*3/uL — ABNORMAL LOW (ref 4.0–10.5)
nRBC: 0 % (ref 0.0–0.2)

## 2022-05-11 LAB — COMPREHENSIVE METABOLIC PANEL
ALT: 8 U/L (ref 0–44)
AST: 11 U/L — ABNORMAL LOW (ref 15–41)
Albumin: 2.2 g/dL — ABNORMAL LOW (ref 3.5–5.0)
Alkaline Phosphatase: 66 U/L (ref 38–126)
Anion gap: 12 (ref 5–15)
BUN: 10 mg/dL (ref 6–20)
CO2: 22 mmol/L (ref 22–32)
Calcium: 9 mg/dL (ref 8.9–10.3)
Chloride: 105 mmol/L (ref 98–111)
Creatinine, Ser: 0.51 mg/dL (ref 0.44–1.00)
GFR, Estimated: >60 mL/min (ref >60)
Glucose, Bld: 132 mg/dL — ABNORMAL HIGH (ref 70–99)
Potassium: 3.8 mmol/L (ref 3.5–5.1)
Sodium: 139 mmol/L (ref 135–145)
Total Bilirubin: 0.5 mg/dL (ref 0.3–1.2)
Total Protein: 5.9 g/dL — ABNORMAL LOW (ref 6.5–8.1)

## 2022-05-11 LAB — OB RESULTS CONSOLE GBS: GBS: NEGATIVE

## 2022-05-11 LAB — GLUCOSE, CAPILLARY
Glucose-Capillary: 138 mg/dL — ABNORMAL HIGH (ref 70–99)
Glucose-Capillary: 134 mg/dL — ABNORMAL HIGH (ref 70–99)
Glucose-Capillary: 55 mg/dL — ABNORMAL LOW (ref 70–99)
Glucose-Capillary: 65 mg/dL — ABNORMAL LOW (ref 70–99)
Glucose-Capillary: 84 mg/dL (ref 70–99)
Glucose-Capillary: 99 mg/dL (ref 70–99)

## 2022-05-11 LAB — TYPE AND SCREEN
ABO/RH(D): A NEG
Antibody Screen: POSITIVE

## 2022-05-11 IMAGING — US US MFM OB FOLLOW-UP
1 series · 14 of 28 positions shown · non-contrast
Comparison: none

[Series 1: us mfm ob follow-up · 55 acquisitions, 14 frames shown]
[im 3/55]
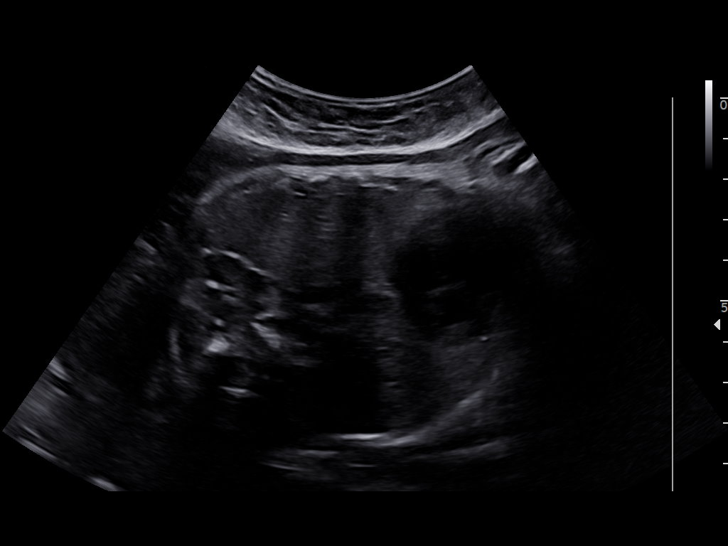
[im 7/55]
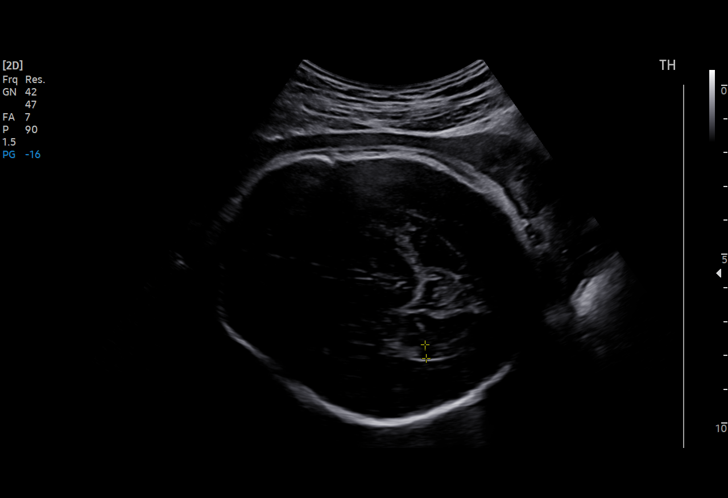
[im 11/55]
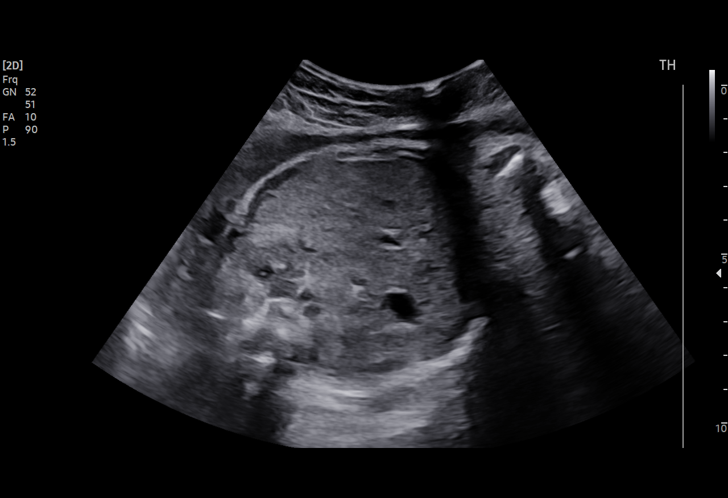
[im 15/55]
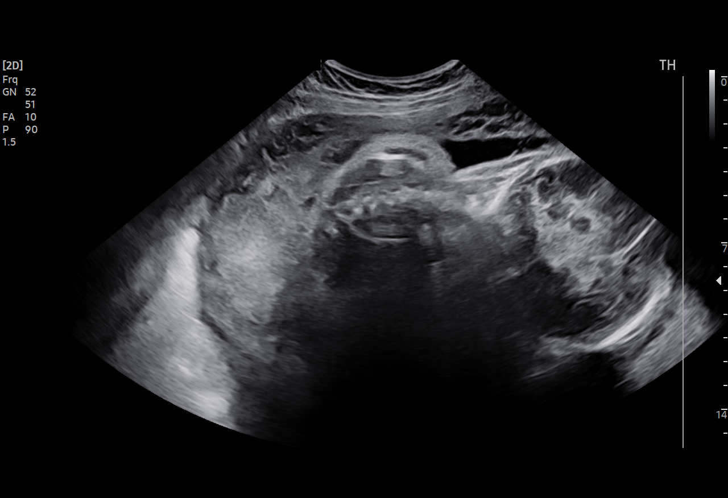
[im 19/55]
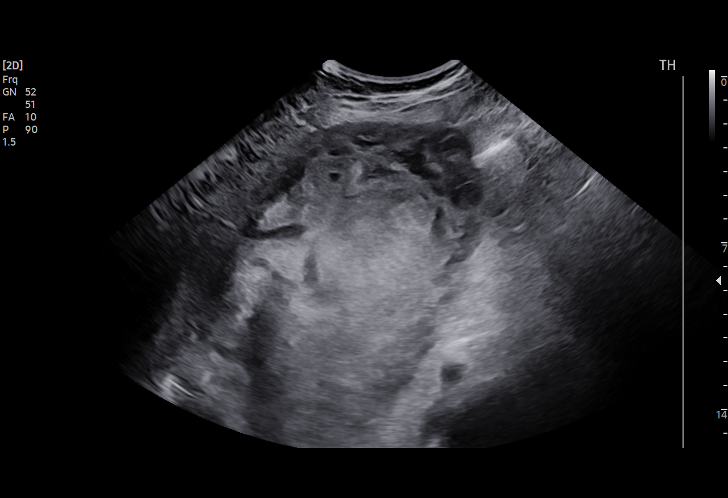
[im 23/55]
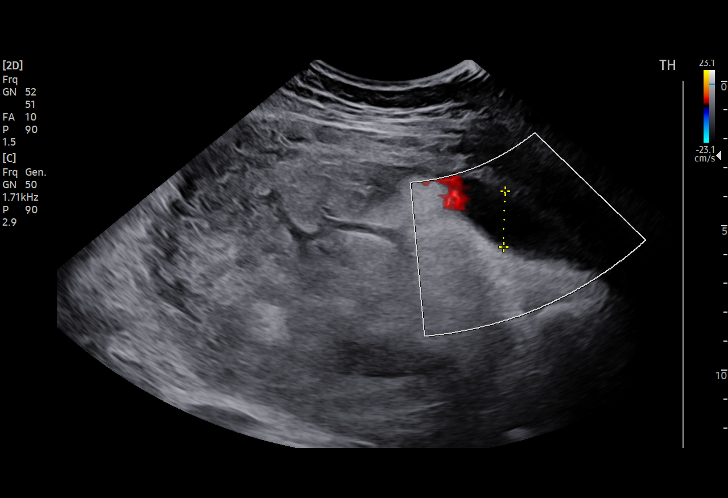
[im 27/55]
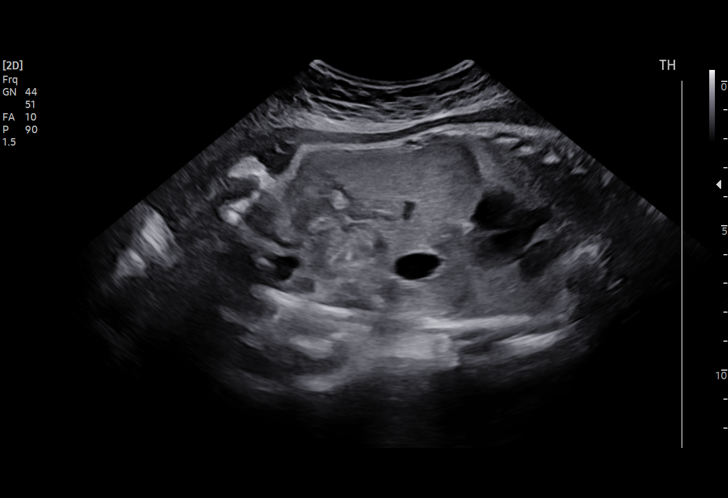
[im 31/55]
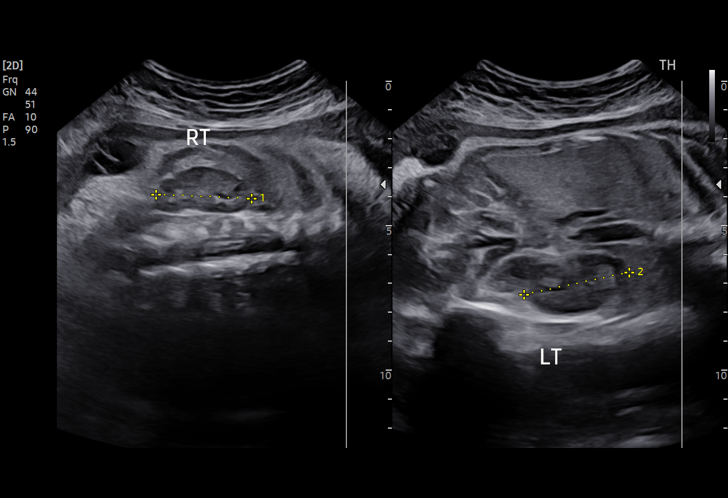
[im 35/55]
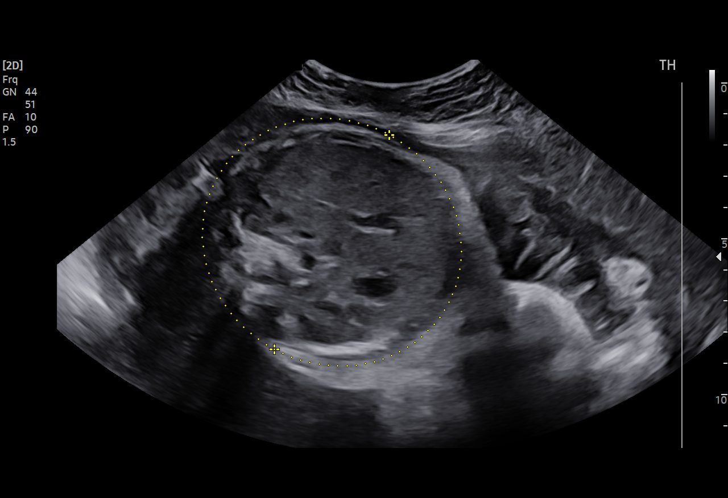
[im 39/55]
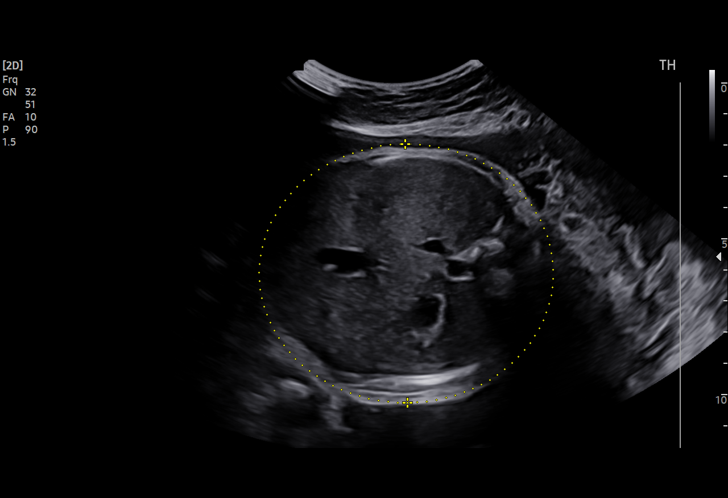
[im 43/55]
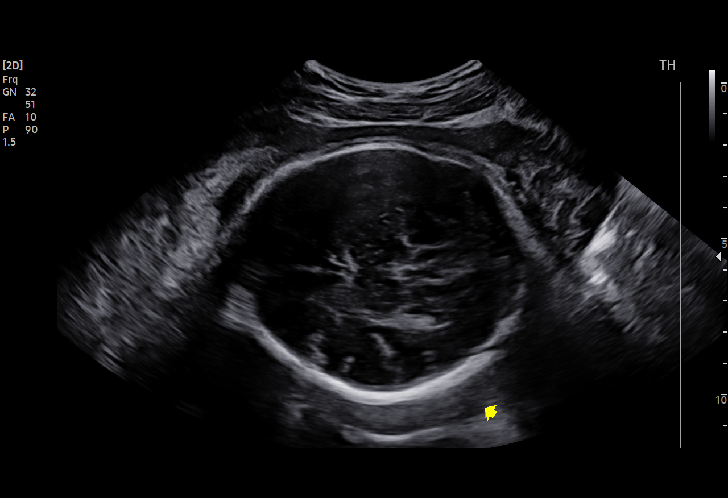
[im 47/55]
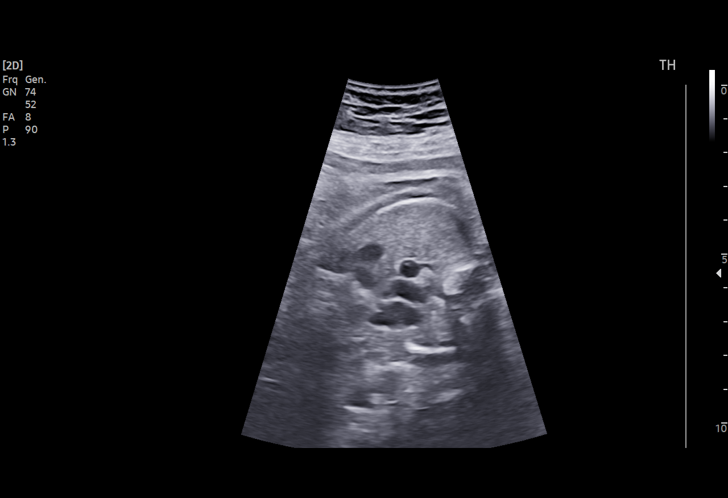
[im 51/55]
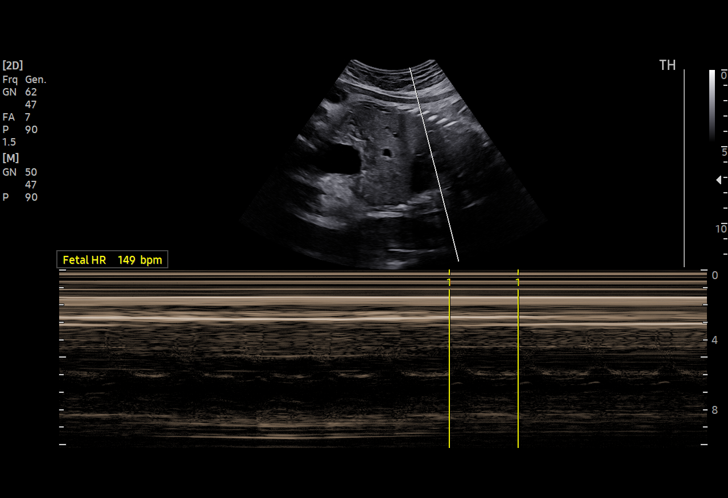
[im 55/55]
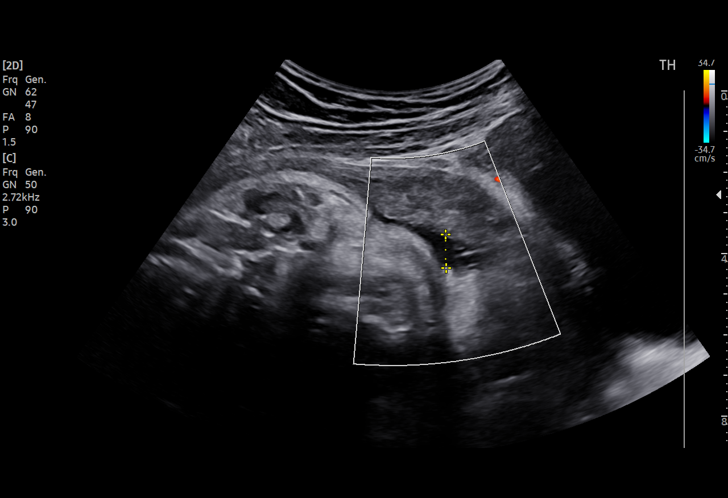

[14 of 28 positions shown; findings below may reference images not displayed]

[facility]

                                                            Care

Indications

 Premature rupture of membranes - leaking       [JB]
 fluid
 Pre-existing diabetes, type 1, in pregnancy,   [JB]
 third trimester
 Advanced maternal age multigravida 35+,        [JB]
 third trimester
 Hypertension - Chronic/Pre-existing            [JB]
 (Labetalol, Procardia)
 32 weeks gestation of pregnancy
 Poor obstetric history: Previous               [JB]
 preeclampsia / eclampsia/gestational HTN
 Medical complication of pregnancy (Female      [JB]
 circumcision)
Fetal Evaluation

 Num Of Fetuses:         1
 Fetal Heart Rate(bpm):  149
 Cardiac Activity:       Observed
 Presentation:           Cephalic
 Placenta:               Posterior
 P. Cord Insertion:      Previously Visualized

 Amniotic Fluid
 AFI FV:      Oligohydramnios

 AFI Sum(cm)     %Tile       Largest Pocket(cm)
 5.02            < 3

 RUQ(cm)       RLQ(cm)       LUQ(cm)        LLQ(cm)
 0
Biophysical Evaluation

 Amniotic F.V:   Pocket => 2 cm             F. Tone:        Observed
 F. Movement:    Observed                   Score:          [DATE]
 F. Breathing:   Observed
Biometry

 BPD:     80.15  mm     G. Age:  32w 1d         34  %    CI:        76.41   %    70 - 86
                                                         FL/HC:      20.4   %    19.1 -
 HC:    290.51   mm     G. Age:  32w 0d          9  %    HC/AC:      1.06        0.96 -
 AC:    274.54   mm     G. Age:  31w 4d         24  %    FL/BPD:     73.8   %    71 - 87
 FL:      59.15  mm     G. Age:  30w 6d          7  %    FL/AC:      21.5   %    20 - 24

 Est. FW:    [JB]  gm    3 lb 14 oz      14  %
OB History

 Gravidity:    3
 Living:       1
Gestational Age

 LMP:           33w 2d        Date:  [DATE]                 EDD:   [DATE]
 U/S Today:     31w 5d                                        EDD:   [DATE]
 Best:          32w 3d     Det. By:  U/S  ([DATE])          EDD:   [DATE]
Anatomy

 Cranium:               Appears normal         Stomach:                Appears normal, left
                                                                       sided
 Ventricles:            Appears normal         Abdomen:                Appears normal
 Thoracic:              Appears normal         Kidneys:                Appear normal
 Heart:                 Not well visualized    Bladder:                Appears normal
 Diaphragm:             Appears normal
Cervix Uterus Adnexa

 Cervix
 Not visualized (advanced GA >[JB])

 Uterus
 No abnormality visualized.

 Right Ovary
 Not visualized.

 Left Ovary
 Not visualized.

 Cul De Sac
 No free fluid seen.

 Adnexa
 No abnormality visualized.
Comments
 This patient has been hospitalized due to PPROM.  Her
 pregnancy has also been complicated by pregestational
 diabetes and chronic hypertension.
 The overall EFW 3 pounds 14 ounces measures at the 14th
 percentile for her gestational age.
 Oligohydramnios with a total AFI of 5 cm is noted.
 A BPP performed today was [DATE].
 Due to PPROM, she will continue inpatient management until
 delivery.

## 2022-05-11 MED ORDER — INSULIN ASPART 100 UNIT/ML IJ SOLN
5.0000 [IU] | Freq: Three times a day (TID) | INTRAMUSCULAR | Status: DC
  Administered 2022-05-11 – 2022-05-12 (×2): 5 [IU] via SUBCUTANEOUS

## 2022-05-11 MED ORDER — INSULIN NPH (HUMAN) (ISOPHANE) 100 UNIT/ML ~~LOC~~ SUSP
24.0000 [IU] | Freq: Every day | SUBCUTANEOUS | Status: DC
  Administered 2022-05-11 – 2022-05-13 (×3): 24 [IU] via SUBCUTANEOUS
  Filled 2022-05-11: qty 10

## 2022-05-11 NOTE — Progress Notes (Signed)
Hypoglycemic Event  CBG: 55 at 1419  Treatment: 4 oz juice/soda  Symptoms: None  Follow-up CBG: Time:1438 CBG Result: 65  Possible Reasons for Event: Medication regimen:    Comments/MD notified: Dr. Para March notified at 1447. No verbal orders received.     Kristin Clay

## 2022-05-11 NOTE — Progress Notes (Signed)
FACULTY PRACTICE ANTEPARTUM COMPREHENSIVE PROGRESS NOTE  Kristin Clay is a 36 y.o. [phone] at [redacted]w[redacted]d who is admitted for PPROM in the setting of known T1DM and CHTN.  Estimated Date of Delivery: 07/03/22 Fetal presentation is cephalic.  Length of Stay:  6 Days. Admitted 05/05/2022  Subjective: (AMN Arabic interpreter used for encounter) Patient denies any concerns Patient reports good fetal movement.  She reports no uterine contractions, no bleeding and no loss of fluid per vagina.  Vitals:  Blood pressure 137/80, pulse 82, temperature 98.4 F (36.9 C), temperature source Oral, resp. rate 17, height 5\' 2"  (1.575 m), weight 57.2 kg, last menstrual period 09/20/2021, SpO2 100 %, not currently breastfeeding. Physical Examination: CONSTITUTIONAL: Well-developed, well-nourished female in no acute distress.  NEUROLOGIC: Alert and oriented to person, place, and time. No cranial nerve deficit noted. PSYCHIATRIC: Normal mood and affect. Normal behavior. Normal judgment and thought content. CARDIOVASCULAR: Normal heart rate noted, regular rhythm RESPIRATORY: Effort and breath sounds normal, no problems with respiration noted MUSCULOSKELETAL: Normal range of motion. Trace BLE symmetric edema and no tenderness. 2+ distal pulses. Negativ ABDOMEN: Soft, nontender, nondistended, gravid. CERVIX: Dilation: 1.5 Effacement (%): 50 Station: -3 (-3, -4) Presentation: Vertex Exam by:: Dr. [email or web address] Exam done 05/07/2022  Fetal monitoring: FHR: 140 bpm, Variability: moderate, Accelerations: Present, Decelerations: Absent  Uterine activity: Flat   Results for orders placed or performed during the hospital encounter of 05/05/22 (from the past 24 hour(s))  Glucose, capillary     Status: Abnormal   Collection Time: 05/10/22 11:57 AM  Result Value Ref Range   Glucose-Capillary 142 (H) 70 - 99 mg/dL  Glucose, capillary     Status: Abnormal   Collection Time: 05/10/22  4:56 PM  Result Value Ref Range    Glucose-Capillary 148 (H) 70 - 99 mg/dL  Glucose, capillary     Status: Abnormal   Collection Time: 05/10/22  9:36 PM  Result Value Ref Range   Glucose-Capillary 137 (H) 70 - 99 mg/dL   Comment 1 Notify RN   Glucose, capillary     Status: Abnormal   Collection Time: 05/11/22  9:32 AM  Result Value Ref Range   Glucose-Capillary 138 (H) 70 - 99 mg/dL   No results found.  Current scheduled medications  amoxicillin  500 mg Oral TID   insulin aspart  0-14 Units Subcutaneous TID PC   insulin aspart  3 Units Subcutaneous TID WC   insulin NPH Human  20 Units Subcutaneous QHS   insulin NPH Human  25 Units Subcutaneous QAC breakfast   labetalol  400 mg Oral TID   NIFEdipine  90 mg Oral Daily   polyethylene glycol  17 g Oral BID    I have reviewed the patient's current medications.  ASSESSMENT: Principal Problem:   Preterm premature rupture of membranes (PPROM) with onset of labor after 24 hours of rupture in third trimester, antepartum Active Problems:   Female circumcision   Language barrier   Type 1 diabetes mellitus with ketoacidosis, uncontrolled (HCC)   Supervision of high risk pregnancy, antepartum   Rh negative, antepartum   Chronic hypertension affecting pregnancy   Short interval between pregnancies affecting pregnancy, antepartum   Previous preterm delivery at 33w6 due to severe preeclampsia, antepartum   History of forceps delivery in prior pregnancy, currently pregnant   PLAN: CBGs stable over the last day; continue recommended insulin for now as per DM coordinator recommendations, appreciate their input.  Continue Procardia and Labetalol for CHTN, can change as  needed. Continue latency antibiotics, she has received BMZ x2, has gotten NICU consult Reassuring FHR tracing, continue NST bid. Growth and BPP final report pending.  Plan to deliver at 34 weeks or earlier if indicated. Reviewed delivery history -- FAVD due to prolonged decel in s/o poor maternal effort.  Size was 1890g, apgars 7/9 at [redacted]w[redacted]d. PP course complicated by small epidural hematoma diagnosed PPD5.  Continue routine antenatal care.   Milas Hock, MD, FACOG Obstetrician & Gynecologist, [facility] for [facility], [facility]

## 2022-05-11 NOTE — Progress Notes (Signed)
Inpatient Diabetes Program Recommendations  AACE/ADA: New Consensus Statement on Inpatient Glycemic Control (2015)  Target Ranges:  Prepandial:   less than 140 mg/dL      Peak postprandial:   less than 180 mg/dL (1-2 hours)      Critically ill patients:  140 - 180 mg/dL   Lab Results  Component Value Date   GLUCAP 138 (H) 05/11/2022   HGBA1C 7.4 (A) 04/28/2022    Review of Glycemic Control  Latest Reference Range & Units 05/10/22 06:47 05/10/22 09:55 05/10/22 11:57 05/10/22 16:56 05/10/22 21:36 05/11/22 09:32  Glucose-Capillary 70 - 99 mg/dL 165 (H) 537 (H) 482 (H) 148 (H) 137 (H) 138 (H)  (H): Data is abnormally high  Current orders for Inpatient glycemic control: NPH 25 units with BF and 20 units QHS, Novolog 3 units TID with meals and 0-14 units TID after meals  Inpatient Diabetes Program Recommendations:    NPH 24 units QHS Novolog 5 units TID with meals   Will continue to follow while inpatient.  Thank you, Dulce Sellar, MSN, CDCES Diabetes Coordinator Inpatient Diabetes Program [phone] (team pager from 8a-5p)

## 2022-05-12 DIAGNOSIS — O42113 Preterm premature rupture of membranes, onset of labor more than 24 hours following rupture, third trimester: Secondary | ICD-10-CM | POA: Diagnosis not present

## 2022-05-12 MED ORDER — CYCLOBENZAPRINE HCL 5 MG PO TABS
5.0000 mg | ORAL_TABLET | Freq: Three times a day (TID) | ORAL | Status: DC | PRN
Start: 2022-05-12 — End: 2022-05-16
  Administered 2022-05-12 – 2022-05-16 (×3): 5 mg via ORAL
  Filled 2022-05-12 (×3): qty 1

## 2022-05-12 MED ORDER — LIDOCAINE 5 % EX PTCH
1.0000 | MEDICATED_PATCH | CUTANEOUS | Status: DC
  Administered 2022-05-12 – 2022-05-15 (×4): 1 via TRANSDERMAL
  Filled 2022-05-12 (×6): qty 1

## 2022-05-12 MED ORDER — INSULIN ASPART 100 UNIT/ML IJ SOLN
4.0000 [IU] | Freq: Three times a day (TID) | INTRAMUSCULAR | Status: DC
  Administered 2022-05-12 – 2022-05-15 (×8): 4 [IU] via SUBCUTANEOUS

## 2022-05-12 NOTE — Progress Notes (Signed)
FACULTY PRACTICE ANTEPARTUM COMPREHENSIVE PROGRESS NOTE  Kristin Clay is a 36 y.o. [phone] at [redacted]w[redacted]d who is admitted for PPROM in the setting of known T1DM and CHTN.  Estimated Date of Delivery: 07/03/22 Fetal presentation is cephalic.  Length of Stay:  8 Days. Admitted 05/05/2022  Subjective: (AMN Arabic interpreter used for encounter) Patient denies any concerns Patient reports good fetal movement.  She reports no uterine contractions, no bleeding and no loss of fluid per vagina.  Vitals:  Blood pressure 125/82, pulse 83, temperature 97.7 F (36.5 C), temperature source Oral, resp. rate 17, height  (1.575 m), weight 57.2 kg, last menstrual period 09/20/2021, SpO2 100 %, not currently breastfeeding. Physical Examination: CONSTITUTIONAL: Well-developed, well-nourished female in no acute distress.  NEUROLOGIC: Alert and oriented to person, place, and time. No cranial nerve deficit noted. PSYCHIATRIC: Normal mood and affect. Normal behavior. Normal judgment and thought content. CARDIOVASCULAR: Normal heart rate noted, regular rhythm RESPIRATORY: Effort and breath sounds normal, no problems with respiration noted MUSCULOSKELETAL: Normal range of motion. Trace BLE symmetric edema and no tenderness. 2+ distal pulses. Negativ ABDOMEN: Soft, nontender, nondistended, gravid. CERVIX: Dilation: 1.5 Effacement (%): 50 Station: -3 (-3, -4) Presentation: Vertex Exam by:: Dr. Macon Large Exam done 05/07/2022  Fetal monitoring: FHR: 140 bpm, Variability: moderate, Accelerations: Present, Decelerations: Absent  Uterine activity: Flat   Results for orders placed or performed during the hospital encounter of 05/05/22 (from the past 48 hour(s))  Glucose, capillary     Status: Abnormal   Collection Time: 05/11/22  9:32 AM  Result Value Ref Range   Glucose-Capillary 138 (H) 70 - 99 mg/dL    Comment: Glucose reference range applies only to samples taken after fasting for at least 8 hours.   Type and screen [facility]     Status: None   Collection Time: 05/11/22 12:16 PM  Result Value Ref Range   ABO/RH(D) A NEG    Antibody Screen POS    Sample Expiration 05/14/2022,2359    Antibody Identification      PASSIVELY ACQUIRED ANTI-D Performed at [facility], [address], [place], [place] [address]   CBC with Differential/Platelet     Status: Abnormal   Collection Time: 05/11/22 12:16 PM  Result Value Ref Range   WBC 3.7 (L) 4.0 - 10.5 K/uL   RBC 4.12 3.87 - 5.11 MIL/uL   Hemoglobin 11.9 (L) 12.0 - 15.0 g/dL   HCT 81.1 91.4 - 78.2 %   MCV 88.1 80.0 - 100.0 fL   MCH 28.9 26.0 - 34.0 pg   MCHC 32.8 30.0 - 36.0 g/dL   RDW 95.6 21.3 - 08.6 %   Platelets 222 150 - 400 K/uL   nRBC 0.0 0.0 - 0.2 %   Neutrophils Relative % 63 %   Neutro Abs 2.4 1.7 - 7.7 K/uL   Lymphocytes Relative 27 %   Lymphs Abs 1.0 0.7 - 4.0 K/uL   Monocytes Relative 7 %   Monocytes Absolute 0.3 0.1 - 1.0 K/uL   Eosinophils Relative 2 %   Eosinophils Absolute 0.1 0.0 - 0.5 K/uL   Basophils Relative 0 %   Basophils Absolute 0.0 0.0 - 0.1 K/uL   Immature Granulocytes 1 %   Abs Immature Granulocytes 0.02 0.00 - 0.07 K/uL    Comment: Performed at [facility], [address] [place], [place] [address]  Comprehensive metabolic panel     Status: Abnormal   Collection Time: 05/11/22 12:16 PM  Result Value Ref Range   Sodium 139 135 - 145 mmol/L   Potassium 3.8 3.5 - 5.1 mmol/L   Chloride 105 98 - 111 mmol/L   CO2 22 22 - 32 mmol/L   Glucose, Bld 132 (H) 70 - 99 mg/dL    Comment: Glucose reference range applies only to samples taken after fasting for at least 8 hours.   BUN 10 6 - 20 mg/dL   Creatinine, Ser 2.87 0.44 - 1.00 mg/dL   Calcium 9.0 8.9 - 86.7 mg/dL   Total Protein 5.9 (L) 6.5 - 8.1 g/dL   Albumin 2.2 (L) 3.5 - 5.0 g/dL   AST 11 (L) 15 - 41 U/L   ALT 8 0 - 44 U/L   Alkaline Phosphatase 66 38 - 126 U/L   Total Bilirubin 0.5 0.3 - 1.2 mg/dL   GFR,  Estimated >67 >20 mL/min    Comment: (NOTE) Calculated using the CKD-EPI Creatinine Equation (2021)    Anion gap 12 5 - 15    Comment: Performed at [facility], [address] [place], [place] [address]  Glucose, capillary     Status: Abnormal   Collection Time: 05/11/22 12:31 PM  Result Value Ref Range   Glucose-Capillary 134 (H) 70 - 99 mg/dL    Comment: Glucose reference range applies only to samples taken after fasting for at least 8 hours.  Glucose, capillary     Status: Abnormal   Collection Time: 05/11/22  2:19 PM  Result Value Ref Range   Glucose-Capillary 55 (L) 70 - 99 mg/dL    Comment: Glucose reference range applies only to samples taken after fasting for at least 8 hours.  Glucose, capillary     Status: Abnormal   Collection Time: 05/11/22  2:38 PM  Result Value Ref Range   Glucose-Capillary 65 (L) 70 - 99 mg/dL    Comment: Glucose reference range applies only to samples taken after fasting for at least 8 hours.  Glucose, capillary     Status: None   Collection Time: 05/11/22  3:05 PM  Result Value Ref Range   Glucose-Capillary 84 70 - 99 mg/dL    Comment: Glucose reference range applies only to samples taken after fasting for at least 8 hours.  Culture, beta strep (group b only)     Status: None (Preliminary result)   Collection Time: 05/11/22  4:38 PM   Specimen: Vaginal/Rectal; Genital  Result Value Ref Range   Specimen Description VAGINAL/RECTAL    Special Requests NONE    Culture      CULTURE REINCUBATED FOR BETTER GROWTH Performed at [facility], [address] [place], [place] [address]    Report Status PENDING   Glucose, capillary     Status: None   Collection Time: 05/11/22  4:57 PM  Result Value Ref Range   Glucose-Capillary 99 70 - 99 mg/dL    Comment: Glucose reference range applies only to samples taken after fasting for at least 8 hours.  Glucose, capillary     Status: Abnormal   Collection Time: 05/13/22  5:16 AM  Result Value  Ref Range   Glucose-Capillary 137 (H) 70 - 99 mg/dL    Comment: Glucose reference range applies only to samples taken after fasting for at least 8 hours.    [place] MFM OB FOLLOW UP  Result Date: 05/11/2022 ----------------------------------------------------------------------  OBSTETRICS REPORT                       (  Signed Final 05/11/2022 05:35 pm) ---------------------------------------------------------------------- Patient Info  ID #:       [id number]                          D.O.B.:  [birth year] (35 yrs)  Name:       Kristin Clay                Visit Date: 05/11/2022 07:20 am              [place] ---------------------------------------------------------------------- Performed By  Attending:        Ma Rings MD         Referred By:      [facility]                                                             Care  Performed By:     Anabel Halon          [facility] ---------------------------------------------------------------------- Orders  #  Description                           Code        Ordered By  1  [place] MFM OB FOLLOW UP                   47829.56    Jaynie Collins  2  [place] MFM FETAL BPP WO NON               76819.01    Tashika Goodin     STRESS ----------------------------------------------------------------------  #  Order #                     Accession #                Episode #  1  [id number]                   [id number]                 [id number]  2  [id number]                   [id number]                 [id number] ---------------------------------------------------------------------- Indications  Premature rupture of membranes - leaking       O42.90  fluid  Pre-existing diabetes, type 1, in pregnancy,   O24.013  third trimester  Advanced maternal age multigravida 8+,        O42.523  third trimester  Hypertension - Chronic/Pre-existing            O10.019  (Labetalol, Procardia)  [redacted] weeks gestation  of pregnancy                Z3A.32  Poor obstetric  history: Previous               O09.299  preeclampsia / eclampsia/gestational HTN  Medical complication of pregnancy (Female      O26.90  circumcision) ---------------------------------------------------------------------- Fetal Evaluation  Num Of Fetuses:         1  Fetal Heart Rate(bpm):  149  Cardiac Activity:       Observed  Presentation:           Cephalic  Placenta:               Posterior  P. Cord Insertion:      Previously Visualized  Amniotic Fluid  AFI FV:      Oligohydramnios  AFI Sum(cm)     %Tile       Largest Pocket(cm)  5.02            < 3         2.61  RUQ(cm)       RLQ(cm)       LUQ(cm)        LLQ(cm)  0             1.59          0.82           2.61 ---------------------------------------------------------------------- Biophysical Evaluation  Amniotic F.V:   Pocket => 2 cm             F. Tone:        Observed  F. Movement:    Observed                   Score:          8/8  F. Breathing:   Observed ---------------------------------------------------------------------- Biometry  BPD:     80.15  mm     G. Age:  32w 1d         34  %    CI:        76.41   %    70 - 86                                                          FL/HC:      20.4   %    19.1 - 21.3  HC:    290.51   mm     G. Age:  32w 0d          9  %    HC/AC:      1.06        0.96 - 1.17  AC:    274.54   mm     G. Age:  31w 4d         24  %    FL/BPD:     73.8   %    71 - 87  FL:      59.15  mm     G. Age:  30w 6d          7  %    FL/AC:      21.5   %    20 - 24  Est. FW:    1763  gm    3 lb 14 oz      14  % ---------------------------------------------------------------------- OB History  Gravidity:  3  Living:       1 ---------------------------------------------------------------------- Gestational Age  LMP:           33w 2d        Date:  09/20/21                 EDD:   06/27/22  U/S Today:     31w 5d                                        EDD:   07/08/22  Best:          Armida Sans 3d     Det.  By:  U/S  (02/02/22)          EDD:   07/03/22 ---------------------------------------------------------------------- Anatomy  Cranium:               Appears normal         Stomach:                Appears normal, left                                                                        sided  Ventricles:            Appears normal         Abdomen:                Appears normal  Thoracic:              Appears normal         Kidneys:                Appear normal  Heart:                 Not well visualized    Bladder:                Appears normal  Diaphragm:             Appears normal ---------------------------------------------------------------------- Cervix Uterus Adnexa  Cervix  Not visualized (advanced GA >24wks)  Uterus  No abnormality visualized.  Right Ovary  Not visualized.  Left Ovary  Not visualized.  Cul De Sac  No free fluid seen.  Adnexa  No abnormality visualized. ---------------------------------------------------------------------- Comments  This patient has been hospitalized due to PPROM.  Her  pregnancy has also been complicated by pregestational  diabetes and chronic hypertension.  The overall EFW 3 pounds 14 ounces measures at the 14th  percentile for her gestational age.  Oligohydramnios with a total AFI of 5 cm is noted.  A BPP performed today was 8 out of 8.  Due to PPROM, she will continue inpatient management until  delivery. ----------------------------------------------------------------------                   Ma Rings, MD Electronically Signed Final Report   05/11/2022 05:35 pm ----------------------------------------------------------------------  [place] MFM FETAL BPP WO NON STRESS  Result Date: 05/11/2022 ----------------------------------------------------------------------  OBSTETRICS REPORT                       (Signed  Final 05/11/2022 05:35 pm) ---------------------------------------------------------------------- Patient Info  ID #:       [id number]                           D.O.B.:  06-02-1986 (35 yrs)  Name:       Kristin Clay                Visit Date: 05/11/2022 07:20 am              [place] ---------------------------------------------------------------------- Performed By  Attending:        Ma Rings MD         Referred By:      [facility]                                                             Care  Performed By:     Anabel Halon          [facility] ---------------------------------------------------------------------- Orders  #  Description                           Code        Ordered By  1  [place] MFM OB FOLLOW UP                   09735.32    Jaynie Collins  2  [place] MFM FETAL BPP WO NON               76819.01    Phenix Vandermeulen     STRESS ----------------------------------------------------------------------  #  Order #                     Accession #                Episode #  1  [id number]                   [id number]                 [id number]  2  [id number]                   [id number]                 [id number] ---------------------------------------------------------------------- Indications  Premature rupture of membranes - leaking       O42.90  fluid  Pre-existing diabetes, type 1, in pregnancy,   O24.013  third trimester  Advanced maternal age multigravida 12+,        O41.523  third trimester  Hypertension - Chronic/Pre-existing            O10.019  (Labetalol, Procardia)  [redacted] weeks gestation of pregnancy                Z3A.32  Poor obstetric history:  Previous               O09.299  preeclampsia / eclampsia/gestational HTN  Medical complication of pregnancy (Female      O26.90  circumcision) ---------------------------------------------------------------------- Fetal Evaluation  Num Of Fetuses:         1  Fetal Heart Rate(bpm):  149  Cardiac Activity:       Observed  Presentation:           Cephalic  Placenta:               Posterior  P. Cord Insertion:      Previously  Visualized  Amniotic Fluid  AFI FV:      Oligohydramnios  AFI Sum(cm)     %Tile       Largest Pocket(cm)  5.02            < 3         2.61  RUQ(cm)       RLQ(cm)       LUQ(cm)        LLQ(cm)  0             1.59          0.82           2.61 ---------------------------------------------------------------------- Biophysical Evaluation  Amniotic F.V:   Pocket => 2 cm             F. Tone:        Observed  F. Movement:    Observed                   Score:          8/8  F. Breathing:   Observed ---------------------------------------------------------------------- Biometry  BPD:     80.15  mm     G. Age:  32w 1d         34  %    CI:        76.41   %    70 - 86                                                          FL/HC:      20.4   %    19.1 - 21.3  HC:    290.51   mm     G. Age:  32w 0d          9  %    HC/AC:      1.06        0.96 - 1.17  AC:    274.54   mm     G. Age:  31w 4d         24  %    FL/BPD:     73.8   %    71 - 87  FL:      59.15  mm     G. Age:  30w 6d          7  %    FL/AC:      21.5   %    20 - 24  Est. FW:    1763  gm    3 lb 14 oz      14  % ---------------------------------------------------------------------- OB History  Gravidity:  3  Living:       1 ---------------------------------------------------------------------- Gestational Age  LMP:           33w 2d        Date:  09/20/21                 EDD:   06/27/22  U/S Today:     31w 5d                                        EDD:   07/08/22  Best:          Armida Sans32w 3d     Det. By:  U/S  (02/02/22)          EDD:   07/03/22 ---------------------------------------------------------------------- Anatomy  Cranium:               Appears normal         Stomach:                Appears normal, left                                                                        sided  Ventricles:            Appears normal         Abdomen:                Appears normal  Thoracic:              Appears normal         Kidneys:                Appear normal  Heart:                  Not well visualized    Bladder:                Appears normal  Diaphragm:             Appears normal ---------------------------------------------------------------------- Cervix Uterus Adnexa  Cervix  Not visualized (advanced GA >24wks)  Uterus  No abnormality visualized.  Right Ovary  Not visualized.  Left Ovary  Not visualized.  Cul De Sac  No free fluid seen.  Adnexa  No abnormality visualized. ---------------------------------------------------------------------- Comments  This patient has been hospitalized due to PPROM.  Her  pregnancy has also been complicated by pregestational  diabetes and chronic hypertension.  The overall EFW 3 pounds 14 ounces measures at the 14th  percentile for her gestational age.  Oligohydramnios with a total AFI of 5 cm is noted.  A BPP performed today was 8 out of 8.  Due to PPROM, she will continue inpatient management until  delivery. ----------------------------------------------------------------------                   Ma RingsVictor Fang, MD Electronically Signed Final Report   05/11/2022 05:35 pm ----------------------------------------------------------------------   Current scheduled medications  insulin aspart  0-14 Units Subcutaneous TID PC   insulin aspart  4 Units Subcutaneous TID WC   insulin NPH Human  24 Units Subcutaneous QHS   insulin NPH Human  25 Units Subcutaneous QAC breakfast   labetalol  400 mg Oral TID   lidocaine  1 patch Transdermal Q24H   NIFEdipine  90 mg Oral Daily   polyethylene glycol  17 g Oral BID    I have reviewed the patient's current medications.  ASSESSMENT: Principal Problem:   Preterm premature rupture of membranes (PPROM) with onset of labor after 24 hours of rupture in third trimester, antepartum Active Problems:   Female circumcision   Language barrier   Type 1 diabetes mellitus with ketoacidosis, uncontrolled (HCC)   Supervision of high risk pregnancy, antepartum   Rh negative, antepartum   Chronic hypertension affecting  pregnancy   Short interval between pregnancies affecting pregnancy, antepartum   Previous preterm delivery at 33w6 due to severe preeclampsia, antepartum   History of forceps delivery in prior pregnancy, currently pregnant   PLAN: - CBGs stable over the last day (not true fasting value this morning as she had snacks overnight), continue recommended insulin for now as per DM coordinator recommendations, appreciate their input.  - Continue Procardia and Labetalol for CHTN, can change as needed. - She has completed the latency antibiotics, has received BMZ x2, has gotten NICU consult - Reassuring FHR tracing, continue NST bid. 6/19 EFW 14%, BPP 8/8, AFI BPP. Continue weekly BPPs. - Plan to deliver at 34 weeks or earlier if indicated. - Continue routine antenatal care.   Jaynie Collins, MD, FACOG Obstetrician & Gynecologist, [facility] for [employer], [facility]

## 2022-05-12 NOTE — Evaluation (Signed)
Physical Therapy Evaluation Patient Details Name: Kristin Clay MRN: [id number] DOB: Jan 09, 1986 Today's Date: 05/12/2022  History of Present Illness  Pt adm 6/14 with premature rupture of membranes at [redacted]w[redacted]d pregnancy. Pt reported neck and shoulder pain which has been chronic and present prior to pregnancy. PMH - type I DM, HTN, pre-eclampsia with prior pregnancy  Clinical Impression  Pt presents to PT with neck and interscapular pain typical with poor posture exacerbated by postures used when caring for an infant. Instructed pt in ex's and placed towel roll along cervical and thoracic spine in supine for anterior stretch. Will return in a few days with written ex program and a tennis ball for pt to perform self massage to interscapular area.      Recommendations for follow up therapy are one component of a multi-disciplinary discharge planning process, led by the attending physician.  Recommendations may be updated based on patient status, additional functional criteria and insurance authorization.  Follow Up Recommendations Other (comment) (follow up with primary care physician for OPPT if pain continues)      Assistance Recommended at Discharge PRN  Patient can return home with the following       Equipment Recommendations None recommended by PT  Recommendations for Other Services       Functional Status Assessment Patient has not had a recent decline in their functional status     Precautions / Restrictions        Mobility  Bed Mobility Overal bed mobility: Independent                  Transfers                   General transfer comment: Not assessed    Ambulation/Gait               General Gait Details: Not assessed  Stairs            Wheelchair Mobility    Modified Rankin (Stroke Patients Only)       Balance                                             Pertinent Vitals/Pain Pain Assessment Pain  Assessment: Faces Faces Pain Scale: Hurts even more Pain Location: neck and interscapular area Pain Descriptors / Indicators: Grimacing, Guarding, Stabbing Pain Intervention(s): Utilized relaxation techniques, Repositioned    Home Living Family/patient expects to be discharged to:: Private residence Living Arrangements: Spouse/significant other;Children Available Help at Discharge: Family Type of Home: Apartment Home Access: Level entry       Home Layout: One level Home Equipment: None      Prior Function Prior Level of Function : Independent/Modified Independent                     Hand Dominance        Extremity/Trunk Assessment   Upper Extremity Assessment Upper Extremity Assessment: Overall WFL for tasks assessed (Slow to raise UE's above shoulders due to pain)    Lower Extremity Assessment Lower Extremity Assessment: Overall WFL for tasks assessed       Communication      Cognition Arousal/Alertness: Awake/alert Behavior During Therapy: WFL for tasks assessed/performed Overall Cognitive Status: Within Functional Limits for tasks assessed  General Comments      Exercises Other Exercises Other Exercises: Cervical retraction x 5 Other Exercises: Cervical lateral rotation x 5 bil Other Exercises: Scapular retraction x 10 Other Exercises: Soft tissue massage to interscapular area   Assessment/Plan    PT Assessment Patient needs continued PT services  PT Problem List Pain       PT Treatment Interventions Patient/family education;Therapeutic exercise    PT Goals (Current goals can be found in the Care Plan section)  Acute Rehab PT Goals Patient Stated Goal: decr pain PT Goal Formulation: With patient Time For Goal Achievement: 05/19/22 Potential to Achieve Goals: Good    Frequency Min 2X/week     Co-evaluation               AM-PAC PT "6 Clicks" Mobility  Outcome Measure  Help needed turning from your back to your side while in a flat bed without using bedrails?: None Help needed moving from lying on your back to sitting on the side of a flat bed without using bedrails?: None Help needed moving to and from a bed to a chair (including a wheelchair)?: None Help needed standing up from a chair using your arms (e.g., wheelchair or bedside chair)?: None Help needed to walk in hospital room?: None Help needed climbing 3-5 steps with a railing? : None 6 Click Score: 24    End of Session   Activity Tolerance: Patient limited by pain Patient left: in bed;with call bell/phone within reach   PT Visit Diagnosis: Pain Pain - part of body:  (neck and interscapular area)    Time: 2992-4268 PT Time Calculation (min) (ACUTE ONLY): 12 min   Charges:   PT Evaluation $PT Eval Low Complexity: 1 Low          [facility] PT Acute Rehabilitation Services Office [phone]   Angelina Ok [facility] 05/12/2022, 5:01 PM

## 2022-05-12 NOTE — Progress Notes (Signed)
Inpatient Diabetes Program Recommendations  AACE/ADA: New Consensus Statement on Inpatient Glycemic Control (2015)  Target Ranges:  Prepandial:   less than 140 mg/dL      Peak postprandial:   less than 180 mg/dL (1-2 hours)      Critically ill patients:  140 - 180 mg/dL   Lab Results  Component Value Date   GLUCAP 99 05/11/2022   HGBA1C 7.4 (A) 04/28/2022    Current orders for Inpatient glycemic control: NPH 25 units with BF and 24 units QHS, Novolog 5 units TID with meals and 0-14 units TID after meals   Inpatient Diabetes Program Recommendations:    Novolog 4 units TID  Will continue to follow while inpatient.  Thank you, Dulce Sellar, MSN, CDCES Diabetes Coordinator Inpatient Diabetes Program [phone] (team pager from 8a-5p)

## 2022-05-12 NOTE — Progress Notes (Addendum)
FACULTY PRACTICE ANTEPARTUM COMPREHENSIVE PROGRESS NOTE  Kristin [place] is a 37 y.o. [phone] at [redacted]w[redacted]d who is admitted for PPROM in the setting of known T1DM and CHTN.  Estimated Date of Delivery: 07/03/22 Fetal presentation is cephalic.  Length of Stay:  7 Days. Admitted 05/05/2022  Subjective: Patient denies any concerns except neck and shoulder pain. She reports she has had this since before she was pregnant.  Patient reports good fetal movement.  She reports no uterine contractions, no bleeding and no loss of fluid per vagina.  Vitals:  Blood pressure 129/78, pulse 88, temperature 98.4 F (36.9 C), temperature source Oral, resp. rate 18, height  (1.575 m), weight 57.2 kg, last menstrual period 09/20/2021, SpO2 99 %, not currently breastfeeding. Physical Examination: CONSTITUTIONAL: Well-developed, well-nourished female in no acute distress.  NEUROLOGIC: Alert and oriented to person, place, and time. No cranial nerve deficit noted. PSYCHIATRIC: Normal mood and affect. Normal behavior. Normal judgment and thought content. CARDIOVASCULAR: Normal heart rate noted, regular rhythm RESPIRATORY: Effort and breath sounds normal, no problems with respiration noted MUSCULOSKELETAL: Normal range of motion. Trace BLE symmetric edema and no tenderness. 2+ distal pulses. Negativ ABDOMEN: Soft, nontender, nondistended, gravid. CERVIX: Dilation: 1.5 Effacement (%): 50 Station: -3 (-3, -4) Presentation: Vertex Exam by:: Dr. Macon [place] Exam done 05/07/2022  Fetal monitoring: FHR: 145 bpm, Variability: moderate, Accelerations: Present, Decelerations: Absent  Uterine activity: Flat   Results for orders placed or performed during the [place] encounter of 05/05/22 (from the past 24 hour(s))  Type and screen [place]     Status: None   Collection Time: 05/11/22 12:16 PM  Result Value Ref Range   ABO/RH(D) A NEG    Antibody Screen POS    Sample Expiration 05/14/2022,2359     Antibody Identification      PASSIVELY ACQUIRED ANTI-D Performed at [facility], [address] [place], [place] [address]   CBC with Differential/Platelet     Status: Abnormal   Collection Time: 05/11/22 12:16 PM  Result Value Ref Range   WBC 3.7 (L) 4.0 - 10.5 K/uL   RBC 4.12 3.87 - 5.11 MIL/uL   Hemoglobin 11.9 (L) 12.0 - 15.0 g/dL   HCT 64.3 32.9 - 51.8 %   MCV 88.1 80.0 - 100.0 fL   MCH 28.9 26.0 - 34.0 pg   MCHC 32.8 30.0 - 36.0 g/dL   RDW 84.1 66.0 - 63.0 %   Platelets 222 150 - 400 K/uL   nRBC 0.0 0.0 - 0.2 %   Neutrophils Relative % 63 %   Neutro Abs 2.4 1.7 - 7.7 K/uL   Lymphocytes Relative 27 %   Lymphs Abs 1.0 0.7 - 4.0 K/uL   Monocytes Relative 7 %   Monocytes Absolute 0.3 0.1 - 1.0 K/uL   Eosinophils Relative 2 %   Eosinophils Absolute 0.1 0.0 - 0.5 K/uL   Basophils Relative 0 %   Basophils Absolute 0.0 0.0 - 0.1 K/uL   Immature Granulocytes 1 %   Abs Immature Granulocytes 0.02 0.00 - 0.07 K/uL  Comprehensive metabolic panel     Status: Abnormal   Collection Time: 05/11/22 12:16 PM  Result Value Ref Range   Sodium 139 135 - 145 mmol/L   Potassium 3.8 3.5 - 5.1 mmol/L   Chloride 105 98 - 111 mmol/L   CO2 22 22 - 32 mmol/L   Glucose, Bld 132 (H) 70 - 99 mg/dL   BUN 10 6 - 20 mg/dL  Creatinine, Ser 0.51 0.44 - 1.00 mg/dL   Calcium 9.0 8.9 - 16.1 mg/dL   Total Protein 5.9 (L) 6.5 - 8.1 g/dL   Albumin 2.2 (L) 3.5 - 5.0 g/dL   AST 11 (L) 15 - 41 U/L   ALT 8 0 - 44 U/L   Alkaline Phosphatase 66 38 - 126 U/L   Total Bilirubin 0.5 0.3 - 1.2 mg/dL   GFR, Estimated >09 >60 mL/min   Anion gap 12 5 - 15  Glucose, capillary     Status: Abnormal   Collection Time: 05/11/22 12:31 PM  Result Value Ref Range   Glucose-Capillary 134 (H) 70 - 99 mg/dL  Glucose, capillary     Status: Abnormal   Collection Time: 05/11/22  2:19 PM  Result Value Ref Range   Glucose-Capillary 55 (L) 70 - 99 mg/dL  Glucose, capillary     Status: Abnormal   Collection Time:  05/11/22  2:38 PM  Result Value Ref Range   Glucose-Capillary 65 (L) 70 - 99 mg/dL  Glucose, capillary     Status: None   Collection Time: 05/11/22  3:05 PM  Result Value Ref Range   Glucose-Capillary 84 70 - 99 mg/dL  Glucose, capillary     Status: None   Collection Time: 05/11/22  4:57 PM  Result Value Ref Range   Glucose-Capillary 99 70 - 99 mg/dL   [place] MFM OB FOLLOW UP  Result Date: 05/11/2022 ----------------------------------------------------------------------  OBSTETRICS REPORT                       (Signed Final 05/11/2022 05:35 pm) ---------------------------------------------------------------------- Patient Info  ID #:       [id number]                          D.O.B.:  1986/07/05 (35 yrs)  Name:       Kristin [place]                Visit Date: 05/11/2022 07:20 am              [name] ---------------------------------------------------------------------- Performed By  Attending:        Ma Rings MD         Referred By:      [facility]                                                             Care  Performed By:     [place]          [facility] ---------------------------------------------------------------------- Orders  #  Description                           Code        Ordered By  1  [place] MFM OB FOLLOW UP  74128.78    [place]  2  [place] MFM FETAL BPP WO NON               67672.09    [facility]     STRESS ----------------------------------------------------------------------  #  Order #                     Accession #                Episode #  1  [id number]                   [id number]                 [id number]  2  [id number]                   [id number]                 [id number] ---------------------------------------------------------------------- Indications  Premature rupture of membranes - leaking       O42.90  fluid  Pre-existing diabetes, type 1, in  pregnancy,   O24.013  third trimester  Advanced maternal age multigravida 65+,        O62.523  third trimester  Hypertension - Chronic/Pre-existing            O10.019  (Labetalol, Procardia)  [redacted] weeks gestation of pregnancy                Z3A.32  Poor obstetric history: Previous               O09.299  preeclampsia / eclampsia/gestational HTN  Medical complication of pregnancy (Female      O26.90  circumcision) ---------------------------------------------------------------------- Fetal Evaluation  Num Of Fetuses:         1  Fetal Heart Rate(bpm):  149  Cardiac Activity:       Observed  Presentation:           Cephalic  Placenta:               Posterior  P. Cord Insertion:      Previously Visualized  Amniotic Fluid  AFI FV:      Oligohydramnios  AFI Sum(cm)     %Tile       Largest Pocket(cm)  5.02            < 3         2.61  RUQ(cm)       RLQ(cm)       LUQ(cm)        LLQ(cm)  0             1.59          0.82           2.61 ---------------------------------------------------------------------- Biophysical Evaluation  Amniotic F.V:   Pocket => 2 cm             F. Tone:        Observed  F. Movement:    Observed                   Score:          8/8  F. Breathing:   Observed ---------------------------------------------------------------------- Biometry  BPD:     80.15  mm     G. Age:  32w 1d         34  %    CI:        76.41   %  70 - 86                                                          FL/HC:      20.4   %    19.1 - 21.3  HC:    290.51   mm     G. Age:  32w 0d          9  %    HC/AC:      1.06        0.96 - 1.17  AC:    274.54   mm     G. Age:  31w 4d         24  %    FL/BPD:     73.8   %    71 - 87  FL:      59.15  mm     G. Age:  30w 6d          7  %    FL/AC:      21.5   %    20 - 24  Est. FW:    1763  gm    3 lb 14 oz      14  % ---------------------------------------------------------------------- OB History  Gravidity:    3  Living:       1  ---------------------------------------------------------------------- Gestational Age  LMP:           33w 2d        Date:  09/20/21                 EDD:   06/27/22  U/S Today:     31w 5d                                        EDD:   07/08/22  Best:          32w 3d     Det. By:  U/S  (02/02/22)          EDD:   07/03/22 ---------------------------------------------------------------------- Anatomy  Cranium:               Appears normal         Stomach:                Appears normal, left                                                                        sided  Ventricles:            Appears normal         Abdomen:                Appears normal  Thoracic:              Appears normal         Kidneys:  Appear normal  Heart:                 Not well visualized    Bladder:                Appears normal  Diaphragm:             Appears normal ---------------------------------------------------------------------- Cervix Uterus Adnexa  Cervix  Not visualized (advanced GA >24wks)  Uterus  No abnormality visualized.  Right Ovary  Not visualized.  Left Ovary  Not visualized.  Cul De Sac  No free fluid seen.  Adnexa  No abnormality visualized. ---------------------------------------------------------------------- Comments  This patient has been hospitalized due to PPROM.  Her  pregnancy has also been complicated by pregestational  diabetes and chronic hypertension.  The overall EFW 3 pounds 14 ounces measures at the 14th  percentile for her gestational age.  Oligohydramnios with a total AFI of 5 cm is noted.  A BPP performed today was 8 out of 8.  Due to PPROM, she will continue inpatient management until  delivery. ----------------------------------------------------------------------                   Kristin RingsVictor Fang, MD Electronically Signed Final Report   05/11/2022 05:35 pm ----------------------------------------------------------------------  US MFM FETAL BPP WO NON STRESS  Result Date:  05/11/2022 ----------------------------------------------------------------------  OBSTETRICS REPORT                       (Signed Final 05/11/2022 05:35 pm) ---------------------------------------------------------------------- Patient Info  ID #:       [id number]                          D.O.B.:  1986-08-18 (35 yrs)  Name:       Kristin PuttFATIMA IBRAHIM H                Visit Date: 05/11/2022 07:20 am              [place] ---------------------------------------------------------------------- Performed By  Attending:        Ma RingsVictor Fang MD         [facility]      [facility]                                                             Care  Performed By:     Kristin Halonori Hutchens          [facility] ---------------------------------------------------------------------- Orders  #  Description                           Code        Ordered By  1  US MFM OB FOLLOW UP                   [id number]    UGONNA  ANYANWU  2  [place] MFM FETAL BPP WO NON               [id number]    UGONNA ANYANWU     STRESS ----------------------------------------------------------------------  #  Order #                     Accession #                Episode #  1  [id number]                   [id number]                 [id number]  2  [id number]                   [id number]                 [id number] ---------------------------------------------------------------------- Indications  Premature rupture of membranes - leaking       O42.90  fluid  Pre-existing diabetes, type 1, in pregnancy,   O24.013  third trimester  Advanced maternal age multigravida 78+,        O30.523  third trimester  Hypertension - Chronic/Pre-existing            O10.019  (Labetalol, Procardia)  [redacted] weeks gestation of pregnancy                Z3A.32  Poor obstetric history: Previous               O09.299  preeclampsia / eclampsia/gestational HTN  Medical complication of pregnancy (Female      O26.90   circumcision) ---------------------------------------------------------------------- Fetal Evaluation  Num Of Fetuses:         1  Fetal Heart Rate(bpm):  149  Cardiac Activity:       Observed  Presentation:           Cephalic  Placenta:               Posterior  P. Cord Insertion:      Previously Visualized  Amniotic Fluid  AFI FV:      Oligohydramnios  AFI Sum(cm)     %Tile       Largest Pocket(cm)  5.02            < 3         2.61  RUQ(cm)       RLQ(cm)       LUQ(cm)        LLQ(cm)  0             1.59          0.82           2.61 ---------------------------------------------------------------------- Biophysical Evaluation  Amniotic F.V:   Pocket => 2 cm             F. Tone:        Observed  F. Movement:    Observed                   Score:          8/8  F. Breathing:   Observed ---------------------------------------------------------------------- Biometry  BPD:     80.15  mm     G. Age:  32w 1d         34  %    CI:        76.41   %    70 -  86                                                          FL/HC:      20.4   %    19.1 - 21.3  HC:    290.51   mm     G. Age:  32w 0d          9  %    HC/AC:      1.06        0.96 - 1.17  AC:    274.54   mm     G. Age:  31w 4d         24  %    FL/BPD:     73.8   %    71 - 87  FL:      59.15  mm     G. Age:  30w 6d          7  %    FL/AC:      21.5   %    20 - 24  Est. FW:    1763  gm    3 lb 14 oz      14  % ---------------------------------------------------------------------- OB History  Gravidity:    3  Living:       1 ---------------------------------------------------------------------- Gestational Age  LMP:           33w 2d        Date:  09/20/21                 EDD:   06/27/22  U/S Today:     31w 5d                                        EDD:   07/08/22  Best:          32w 3d     Det. By:  U/S  (02/02/22)          EDD:   07/03/22 ---------------------------------------------------------------------- Anatomy  Cranium:               Appears normal         Stomach:                 Appears normal, left                                                                        sided  Ventricles:            Appears normal         Abdomen:                Appears normal  Thoracic:              Appears normal         Kidneys:  Appear normal  Heart:                 Not well visualized    Bladder:                Appears normal  Diaphragm:             Appears normal ---------------------------------------------------------------------- Cervix Uterus Adnexa  Cervix  Not visualized (advanced GA >24wks)  Uterus  No abnormality visualized.  Right Ovary  Not visualized.  Left Ovary  Not visualized.  Cul De Sac  No free fluid seen.  Adnexa  No abnormality visualized. ---------------------------------------------------------------------- Comments  This patient has been hospitalized due to PPROM.  Her  pregnancy has also been complicated by pregestational  diabetes and chronic hypertension.  The overall EFW 3 pounds 14 ounces measures at the 14th  percentile for her gestational age.  Oligohydramnios with a total AFI of 5 cm is noted.  A BPP performed today was 8 out of 8.  Due to PPROM, she will continue inpatient management until  delivery. ----------------------------------------------------------------------                   [place]ristin Rings, MD Electronically Signed Final Report   05/11/2022 05:35 pm ----------------------------------------------------------------------   Current scheduled medications  amoxicillin  500 mg Oral TID   insulin aspart  0-14 Units Subcutaneous TID PC   insulin aspart  5 Units Subcutaneous TID WC   insulin NPH Human  24 Units Subcutaneous QHS   insulin NPH Human  25 Units Subcutaneous QAC breakfast   labetalol  400 mg Oral TID   lidocaine  1 patch Transdermal Q24H   NIFEdipine  90 mg Oral Daily   polyethylene glycol  17 g Oral BID    I have reviewed the patient's current medications.  ASSESSMENT: Principal Problem:   Preterm premature rupture of  membranes (PPROM) with onset of labor after 24 hours of rupture in third trimester, antepartum Active Problems:   Female circumcision   Language barrier   Type 1 diabetes mellitus with ketoacidosis, uncontrolled (HCC)   Supervision of high risk pregnancy, antepartum   Rh negative, antepartum   Chronic hypertension affecting pregnancy   Short interval between pregnancies affecting pregnancy, antepartum   Previous preterm delivery at 33w6 due to severe preeclampsia, antepartum   History of forceps delivery in prior pregnancy, currently pregnant   PLAN: - CBGs doing well overall; Insulin adjusted as per DM coordinator recommendations, appreciate their input.  - Continue Procardia and Labetalol for CHTN, can change as needed. - Continue latency antibiotics, she has received BMZ x2, has gotten NICU consult - Reassuring FHR tracing, continue NST bid. - Growth and BPP 14%ile, AFI 5, BPP 8/8.  - Plan to deliver at 34 weeks or earlier if indicated. Reviewed delivery history -- FAVD due to prolonged decel in s/o poor maternal effort. Size was 1890g, apgars 7/9 at [redacted]w[redacted]d. PP course complicated by small epidural hematoma diagnosed PPD5.  - We discussed birth control postpartum - reviewed all options in a tiered fashion. She will discuss with her husband later today and let me know. She would like to do something.  - We discussed circumcision - she would like her baby circumcised prior to discharge.  - For neck pain - discussed heating pad, lido patch, flexeril and PT. Will likely need w/u with PCP after delivery.  - Continue routine antenatal care.   Milas Hock, MD, FACOG Obstetrician & Gynecologist, [place] [facility] [place] [facility]

## 2022-05-13 DIAGNOSIS — Z3A31 31 weeks gestation of pregnancy: Secondary | ICD-10-CM | POA: Diagnosis not present

## 2022-05-13 DIAGNOSIS — O10919 Unspecified pre-existing hypertension complicating pregnancy, unspecified trimester: Secondary | ICD-10-CM | POA: Diagnosis not present

## 2022-05-13 DIAGNOSIS — O42113 Preterm premature rupture of membranes, onset of labor more than 24 hours following rupture, third trimester: Secondary | ICD-10-CM | POA: Diagnosis not present

## 2022-05-13 LAB — CULTURE, BETA STREP (GROUP B ONLY)

## 2022-05-13 LAB — GLUCOSE, CAPILLARY: Glucose-Capillary: 137 mg/dL — ABNORMAL HIGH (ref 70–99)

## 2022-05-13 NOTE — Inpatient Diabetes Management (Signed)
ADA Standards of Care 2023 Diabetes in Pregnancy Target Glucose Ranges:  Fasting: 70 - 95 mg/dL 1 hr postprandial:  943 - 140mg /dL (from first bite of meal) 2 hr postprandial:  100 - 120 mg/dL (from first bit of meal)     Current Orders: NPH Insulin 25 units AM     NPH Insulin 24 units QHS     Novolog 0-14 units TID 2-hr post meals     Novolog 4 units TID with meals    Pt received 4 units Novolog at 10am for Breakfast and 3 units Novolog at 12pm for 2-hr post Breakfast CBG  Note CBG taken at 5am was 137--per MD notes, likely not true fasting as pt had snacks overnight  Per RN, CBG 2-hour post-breakfast today was 174    MD-   1. If AM CBGs remain >90 tomorrow AM, please consider increasing the Bedtime NPH Insulin slightly to 26 units QHS  2. Increase the Novolog Meal Coverage to 5 units TID with meals    --Will follow patient during hospitalization--  RN, MSN, CDE Diabetes Coordinator Inpatient Glycemic Control Team Team Pager: [phone] (8a-5p)

## 2022-05-13 NOTE — Progress Notes (Signed)
Initial Nutrition Assessment  DOCUMENTATION CODES:   Not applicable  INTERVENTION:  CHO modified gestational diabetic diet Double protein portions when requested by pt   NUTRITION DIAGNOSIS:   Increased nutrient needs related to  (pregnancy and fetal growth requirements) as evidenced by  (32 weeks IUP).    GOAL:   Patient will meet greater than or equal to 90% of their needs    MONITOR:   Labs, Weight trends  REASON FOR ASSESSMENT:   Antenatal, LOS    ASSESSMENT:   Now 32 5/7 weeks, adm due to PROM, T1DM and CHTN. weight at 11 weeks 54.8 kg, BMI 22. Lowest weight on 02/26/22 47.7 kg, secondary to DKA, n/v. Now with a net weight gain of 2.4 kg. delivery planned at 34 weeks with plan to be  adm until then    Diet Order:   Diet Order             Diet gestational carb mod Fluid consistency: Thin; Room service appropriate? Yes  Diet effective now                   EDUCATION NEEDS:   No education needs have been identified at this time  Skin:  Skin Assessment: Reviewed RN Assessment    Height:   Ht Readings from Last 1 Encounters:  05/10/22 5\' 2"  (1.575 m)    Weight:   Wt Readings from Last 1 Encounters:  05/10/22 57.2 kg    Ideal Body Weight:   110 lbs  BMI:  Body mass index is 23.06 kg/m.  Estimated Nutritional Needs:   Kcal:  2300-2400  Protein:  98-108 g  Fluid:  >2.3 L

## 2022-05-14 ENCOUNTER — Encounter (HOSPITAL_COMMUNITY): Payer: Self-pay | Admitting: *Deleted

## 2022-05-14 ENCOUNTER — Telehealth (HOSPITAL_COMMUNITY): Payer: Self-pay | Admitting: *Deleted

## 2022-05-14 DIAGNOSIS — O42113 Preterm premature rupture of membranes, onset of labor more than 24 hours following rupture, third trimester: Secondary | ICD-10-CM | POA: Diagnosis not present

## 2022-05-14 DIAGNOSIS — Z3A31 31 weeks gestation of pregnancy: Secondary | ICD-10-CM | POA: Diagnosis not present

## 2022-05-14 LAB — COMPREHENSIVE METABOLIC PANEL
ALT: 9 U/L (ref 0–44)
AST: 12 U/L — ABNORMAL LOW (ref 15–41)
Albumin: 2.3 g/dL — ABNORMAL LOW (ref 3.5–5.0)
Alkaline Phosphatase: 83 U/L (ref 38–126)
Anion gap: 10 (ref 5–15)
BUN: 11 mg/dL (ref 6–20)
CO2: 20 mmol/L — ABNORMAL LOW (ref 22–32)
Calcium: 8.7 mg/dL — ABNORMAL LOW (ref 8.9–10.3)
Chloride: 105 mmol/L (ref 98–111)
Creatinine, Ser: 0.49 mg/dL (ref 0.44–1.00)
GFR, Estimated: >60 mL/min (ref >60)
Glucose, Bld: 56 mg/dL — ABNORMAL LOW (ref 70–99)
Potassium: 3.9 mmol/L (ref 3.5–5.1)
Sodium: 135 mmol/L (ref 135–145)
Total Bilirubin: 0.7 mg/dL (ref 0.3–1.2)
Total Protein: 6.5 g/dL (ref 6.5–8.1)

## 2022-05-14 LAB — CBC WITH DIFFERENTIAL/PLATELET
Abs Immature Granulocytes: 0.02 10*3/uL (ref 0.00–0.07)
Basophils Absolute: 0 10*3/uL (ref 0.0–0.1)
Basophils Relative: 0 %
Eosinophils Absolute: 0 10*3/uL (ref 0.0–0.5)
Eosinophils Relative: 0 %
HCT: 37.3 % (ref 36.0–46.0)
Hemoglobin: 12.6 g/dL (ref 12.0–15.0)
Immature Granulocytes: 0 %
Lymphocytes Relative: 17 %
Lymphs Abs: 1 10*3/uL (ref 0.7–4.0)
MCH: 28.9 pg (ref 26.0–34.0)
MCHC: 33.8 g/dL (ref 30.0–36.0)
MCV: 85.6 fL (ref 80.0–100.0)
Monocytes Absolute: 0.4 10*3/uL (ref 0.1–1.0)
Monocytes Relative: 8 %
Neutro Abs: 4.2 10*3/uL (ref 1.7–7.7)
Neutrophils Relative %: 75 %
Platelets: 229 10*3/uL (ref 150–400)
RBC: 4.36 MIL/uL (ref 3.87–5.11)
RDW: 13.3 % (ref 11.5–15.5)
WBC: 5.7 10*3/uL (ref 4.0–10.5)
nRBC: 0 % (ref 0.0–0.2)

## 2022-05-14 LAB — GLUCOSE, CAPILLARY
Glucose-Capillary: 155 mg/dL — ABNORMAL HIGH (ref 70–99)
Glucose-Capillary: 59 mg/dL — ABNORMAL LOW (ref 70–99)
Glucose-Capillary: 184 mg/dL — ABNORMAL HIGH (ref 70–99)

## 2022-05-14 MED ORDER — INSULIN NPH (HUMAN) (ISOPHANE) 100 UNIT/ML ~~LOC~~ SUSP
26.0000 [IU] | Freq: Every day | SUBCUTANEOUS | Status: DC
  Administered 2022-05-14 – 2022-05-15 (×2): 26 [IU] via SUBCUTANEOUS
  Filled 2022-05-14: qty 10

## 2022-05-14 MED ORDER — COMPLETENATE 29-1 MG PO CHEW
1.0000 | CHEWABLE_TABLET | Freq: Every day | ORAL | Status: DC
  Administered 2022-05-14 – 2022-05-15 (×2): 1 via ORAL
  Filled 2022-05-14 (×3): qty 1

## 2022-05-14 NOTE — Progress Notes (Addendum)
Physical Therapy Treatment/DC Note Patient Details Name: Kristin Clay MRN: [id number] DOB: Jul 11, 1986 Today's Date: 05/14/2022   History of Present Illness Pt adm 6/14 with premature rupture of membranes at [redacted]w[redacted]d pregnancy. Pt reported neck and shoulder pain which has been chronic and present prior to pregnancy. PMH - type I DM, HTN, pre-eclampsia with prior pregnancy    PT Comments    Gave pt written exercise program including video link. Reviewed all ex's and explained using tennis ball for self massage to interscapular area. Pt will have to obtain tennis ball. Explained to pt that rounded shoulders/forward head posture that is common with many activities including holding her 46 month old and performing work that is always in front of her body can increase pain. Explained continue to do these exercises to counteract these postures. PT signing off.    Recommendations for follow up therapy are one component of a multi-disciplinary discharge planning process, led by the attending physician.  Recommendations may be updated based on patient status, additional functional criteria and insurance authorization.  Follow Up Recommendations  Other (comment) (follow up with primary care physician after return home)     Assistance Recommended at Discharge None  Patient can return home with the following     Equipment Recommendations  None recommended by PT    Recommendations for Other Services       Precautions / Restrictions Precautions Precautions: None     Mobility  Bed Mobility Overal bed mobility: Independent                  Transfers                        Ambulation/Gait                   Stairs             Wheelchair Mobility    Modified Rankin (Stroke Patients Only)       Balance                                            Cognition Arousal/Alertness: Awake/alert Behavior During Therapy: WFL for tasks  assessed/performed Overall Cognitive Status: Within Functional Limits for tasks assessed                                          Exercises Other Exercises Other Exercises: Cervical retraction x 5 Other Exercises: Cervical lateral rotation x 5 bil Other Exercises: Scapular retraction x 10 Other Exercises: Soft tissue massage to interscapular area Other Exercises: supine chest stretch with UE's externally rotated at shoulder level    General Comments        Pertinent Vitals/Pain Pain Assessment Pain Assessment: Faces Faces Pain Scale: Hurts little more Pain Location: neck and interscapular area Pain Descriptors / Indicators: Grimacing Pain Intervention(s): Utilized relaxation techniques    Home Living                          Prior Function            PT Goals (current goals can now be found in the care plan section) Progress towards PT goals: Goals met/education completed, patient discharged from  PT    Frequency           PT Plan      Co-evaluation              AM-PAC PT "6 Clicks" Mobility   Outcome Measure  Help needed turning from your back to your side while in a flat bed without using bedrails?: None Help needed moving from lying on your back to sitting on the side of a flat bed without using bedrails?: None Help needed moving to and from a bed to a chair (including a wheelchair)?: None Help needed standing up from a chair using your arms (e.g., wheelchair or bedside chair)?: None Help needed to walk in hospital room?: None Help needed climbing 3-5 steps with a railing? : None 6 Click Score: 24    End of Session   Activity Tolerance: Patient tolerated treatment well Patient left: in bed;with call bell/phone within reach;with nursing/sitter in room         Time: 1528-1540 PT Time Calculation (min) (ACUTE ONLY): 12 min  Charges:  $Therapeutic Exercise: 8-22 mins                     [place] [facility] [place] 05/14/2022, 4:44 PM

## 2022-05-14 NOTE — Progress Notes (Signed)
CBG 116 at 2025. Patient stated feeling much better.

## 2022-05-14 NOTE — Progress Notes (Signed)
Dr. Para March made aware of BS of [phone]. Patient is currently drinking Orange Juice. Awaiting lunch tray. No new orders given. Carmelina Dane, RN

## 2022-05-14 NOTE — Progress Notes (Addendum)
Contact with Dr. Para March in regards to [facility] of 61. Pt currently working on full tray breakfast. Instructed not to hold 4units of Novalog with breakfast. Will recheck BS at 11:30. Carmelina Dane, RN

## 2022-05-14 NOTE — Progress Notes (Signed)
Hypoglycemic Event  CBG: 55  Treatment: 8 oz juice/soda  Symptoms:  dizzy  Follow-up CBG: Time:1935 CBG Result:55  Possible Reasons for Event: Unknown  Comments/MD notified:no   CBG after Juice with finger stick 59.  Graham crackers and peanut butter given will re check CBG at 2100.  Rudell Cobb D

## 2022-05-14 NOTE — Telephone Encounter (Signed)
Preadmission screen Preadmission screen Interpreter number [phone]

## 2022-05-15 ENCOUNTER — Ambulatory Visit: Payer: Medicaid Other

## 2022-05-15 DIAGNOSIS — O42113 Preterm premature rupture of membranes, onset of labor more than 24 hours following rupture, third trimester: Secondary | ICD-10-CM | POA: Diagnosis not present

## 2022-05-15 DIAGNOSIS — Z3A31 31 weeks gestation of pregnancy: Secondary | ICD-10-CM | POA: Diagnosis not present

## 2022-05-15 LAB — GLUCOSE, CAPILLARY
Glucose-Capillary: 224 mg/dL — ABNORMAL HIGH (ref 70–99)
Glucose-Capillary: 73 mg/dL (ref 70–99)
Glucose-Capillary: 181 mg/dL — ABNORMAL HIGH (ref 70–99)
Glucose-Capillary: 117 mg/dL — ABNORMAL HIGH (ref 70–99)
Glucose-Capillary: 138 mg/dL — ABNORMAL HIGH (ref 70–99)

## 2022-05-15 MED ORDER — DEXTROSE 50 % IV SOLN: 25.0000 g | Freq: Once | INTRAVENOUS | Status: DC | PRN

## 2022-05-15 NOTE — Inpatient Diabetes Management (Addendum)
Inpatient Diabetes Program Recommendations  AACE/ADA: New Consensus Statement on Inpatient Glycemic Control (2015)  Target Ranges:  Prepandial:   less than 140 mg/dL      Peak postprandial:   less than 180 mg/dL (1-2 hours)      Critically ill patients:  140 - 180 mg/dL   Lab Results  Component Value Date   GLUCAP 224 (H) 05/15/2022   HGBA1C 7.4 (A) 04/28/2022    Please hold Meal coverage-Novolog 4 units if CBG is <80 mg/dL.  Patient has been hypoglycemic after receiving meal coverage when CGM reading and or BG was < 80 mg/dL.    Appears patient is eating overnight.  Was documented she ate a sandwich at 0230 this morning.  Please encourage carb free snacks overnight.  Please also consider adding Novolog 0-14 units correction at 0700 if continues to eat overnight.  Addendum@ 1333:  53 mg/dL at 1pm today.  Decrease NPH to 25 units QAM and 24 units QPM   Will continue to follow while inpatient.  Thank you, Dulce Sellar, MSN, CDCES Diabetes Coordinator Inpatient Diabetes Program [phone] (team pager from 8a-5p)

## 2022-05-15 NOTE — Progress Notes (Signed)
Patient requested sandwich at 0230, CBG 71 at that time.  Patient ate all of sandwich.

## 2022-05-16 ENCOUNTER — Encounter (HOSPITAL_COMMUNITY): Payer: Self-pay | Admitting: Obstetrics & Gynecology

## 2022-05-16 ENCOUNTER — Inpatient Hospital Stay (HOSPITAL_COMMUNITY): Payer: Medicaid Other | Admitting: Anesthesiology

## 2022-05-16 ENCOUNTER — Encounter (HOSPITAL_COMMUNITY)
Admission: AD | Disposition: A | Discharge status: Home / Self Care | Payer: Self-pay | Source: Home / Self Care | Attending: Obstetrics and Gynecology

## 2022-05-16 ENCOUNTER — Other Ambulatory Visit: Payer: Self-pay

## 2022-05-16 DIAGNOSIS — O9902 Anemia complicating childbirth: Secondary | ICD-10-CM | POA: Diagnosis not present

## 2022-05-16 DIAGNOSIS — Z3A31 31 weeks gestation of pregnancy: Secondary | ICD-10-CM | POA: Diagnosis not present

## 2022-05-16 DIAGNOSIS — O3463 Maternal care for abnormality of vagina, third trimester: Secondary | ICD-10-CM | POA: Diagnosis not present

## 2022-05-16 DIAGNOSIS — O2412 Pre-existing diabetes mellitus, type 2, in childbirth: Secondary | ICD-10-CM | POA: Diagnosis not present

## 2022-05-16 DIAGNOSIS — Z3A33 33 weeks gestation of pregnancy: Secondary | ICD-10-CM

## 2022-05-16 DIAGNOSIS — O339 Maternal care for disproportion, unspecified: Secondary | ICD-10-CM

## 2022-05-16 DIAGNOSIS — O42013 Preterm premature rupture of membranes, onset of labor within 24 hours of rupture, third trimester: Secondary | ICD-10-CM | POA: Diagnosis not present

## 2022-05-16 DIAGNOSIS — O1002 Pre-existing essential hypertension complicating childbirth: Secondary | ICD-10-CM | POA: Diagnosis not present

## 2022-05-16 DIAGNOSIS — O42113 Preterm premature rupture of membranes, onset of labor more than 24 hours following rupture, third trimester: Secondary | ICD-10-CM | POA: Diagnosis not present

## 2022-05-16 DIAGNOSIS — O24424 Gestational diabetes mellitus in childbirth, insulin controlled: Secondary | ICD-10-CM | POA: Diagnosis not present

## 2022-05-16 DIAGNOSIS — O164 Unspecified maternal hypertension, complicating childbirth: Secondary | ICD-10-CM | POA: Diagnosis not present

## 2022-05-16 DIAGNOSIS — D649 Anemia, unspecified: Secondary | ICD-10-CM | POA: Diagnosis not present

## 2022-05-16 LAB — COMPREHENSIVE METABOLIC PANEL
ALT: 10 U/L (ref 0–44)
AST: 11 U/L — ABNORMAL LOW (ref 15–41)
Albumin: 2 g/dL — ABNORMAL LOW (ref 3.5–5.0)
Alkaline Phosphatase: 84 U/L (ref 38–126)
Anion gap: 7 (ref 5–15)
BUN: 10 mg/dL (ref 6–20)
CO2: 17 mmol/L — ABNORMAL LOW (ref 22–32)
Calcium: 8 mg/dL — ABNORMAL LOW (ref 8.9–10.3)
Chloride: 110 mmol/L (ref 98–111)
Creatinine, Ser: 0.39 mg/dL — ABNORMAL LOW (ref 0.44–1.00)
GFR, Estimated: >60 mL/min (ref >60)
Glucose, Bld: 85 mg/dL (ref 70–99)
Potassium: 4 mmol/L (ref 3.5–5.1)
Sodium: 134 mmol/L — ABNORMAL LOW (ref 135–145)
Total Bilirubin: 0.4 mg/dL (ref 0.3–1.2)
Total Protein: 5.8 g/dL — ABNORMAL LOW (ref 6.5–8.1)

## 2022-05-16 LAB — GLUCOSE, CAPILLARY
Glucose-Capillary: 102 mg/dL — ABNORMAL HIGH (ref 70–99)
Glucose-Capillary: 107 mg/dL — ABNORMAL HIGH (ref 70–99)
Glucose-Capillary: 109 mg/dL — ABNORMAL HIGH (ref 70–99)
Glucose-Capillary: 142 mg/dL — ABNORMAL HIGH (ref 70–99)
Glucose-Capillary: 175 mg/dL — ABNORMAL HIGH (ref 70–99)
Glucose-Capillary: 213 mg/dL — ABNORMAL HIGH (ref 70–99)
Glucose-Capillary: 226 mg/dL — ABNORMAL HIGH (ref 70–99)
Glucose-Capillary: 236 mg/dL — ABNORMAL HIGH (ref 70–99)
Glucose-Capillary: 30 mg/dL — CL (ref 70–99)
Glucose-Capillary: 46 mg/dL — ABNORMAL LOW (ref 70–99)
Glucose-Capillary: 53 mg/dL — ABNORMAL LOW (ref 70–99)
Glucose-Capillary: 68 mg/dL — ABNORMAL LOW (ref 70–99)
Glucose-Capillary: 72 mg/dL (ref 70–99)
Glucose-Capillary: 75 mg/dL (ref 70–99)
Glucose-Capillary: 82 mg/dL (ref 70–99)
Glucose-Capillary: 90 mg/dL (ref 70–99)
Glucose-Capillary: 91 mg/dL (ref 70–99)
Glucose-Capillary: 91 mg/dL (ref 70–99)

## 2022-05-16 LAB — CBC WITH DIFFERENTIAL/PLATELET
Abs Immature Granulocytes: 0.03 10*3/uL (ref 0.00–0.07)
Basophils Absolute: 0 10*3/uL (ref 0.0–0.1)
Basophils Relative: 0 %
Eosinophils Absolute: 0 10*3/uL (ref 0.0–0.5)
Eosinophils Relative: 0 %
HCT: 35.6 % — ABNORMAL LOW (ref 36.0–46.0)
Hemoglobin: 11.7 g/dL — ABNORMAL LOW (ref 12.0–15.0)
Immature Granulocytes: 0 %
Lymphocytes Relative: 12 %
Lymphs Abs: 0.9 10*3/uL (ref 0.7–4.0)
MCH: 28.3 pg (ref 26.0–34.0)
MCHC: 32.9 g/dL (ref 30.0–36.0)
MCV: 86 fL (ref 80.0–100.0)
Monocytes Absolute: 0.5 10*3/uL (ref 0.1–1.0)
Monocytes Relative: 7 %
Neutro Abs: 6 10*3/uL (ref 1.7–7.7)
Neutrophils Relative %: 81 %
Platelets: 252 10*3/uL (ref 150–400)
RBC: 4.14 MIL/uL (ref 3.87–5.11)
RDW: 13.3 % (ref 11.5–15.5)
WBC: 7.6 10*3/uL (ref 4.0–10.5)
nRBC: 0 % (ref 0.0–0.2)

## 2022-05-16 LAB — BETA-HYDROXYBUTYRIC ACID: Beta-Hydroxybutyric Acid: 0.07 mmol/L (ref 0.05–0.27)

## 2022-05-16 LAB — CBC
HCT: 34.8 % — ABNORMAL LOW (ref 36.0–46.0)
Hemoglobin: 11.5 g/dL — ABNORMAL LOW (ref 12.0–15.0)
MCH: 28.8 pg (ref 26.0–34.0)
MCHC: 33 g/dL (ref 30.0–36.0)
MCV: 87 fL (ref 80.0–100.0)
Platelets: 206 10*3/uL (ref 150–400)
RBC: 4 MIL/uL (ref 3.87–5.11)
RDW: 13.5 % (ref 11.5–15.5)
WBC: 6.9 10*3/uL (ref 4.0–10.5)
nRBC: 0 % (ref 0.0–0.2)

## 2022-05-16 LAB — POCT I-STAT 7, (LYTES, BLD GAS, ICA,H+H)
Acid-base deficit: 8 mmol/L — ABNORMAL HIGH (ref 0.0–2.0)
Bicarbonate: 15.9 mmol/L — ABNORMAL LOW (ref 20.0–28.0)
Calcium, Ion: 1.14 mmol/L — ABNORMAL LOW (ref 1.15–1.40)
HCT: 29 % — ABNORMAL LOW (ref 36.0–46.0)
Hemoglobin: 9.9 g/dL — ABNORMAL LOW (ref 12.0–15.0)
O2 Saturation: 92 %
Patient temperature: 35
Potassium: 3.7 mmol/L (ref 3.5–5.1)
Sodium: 137 mmol/L (ref 135–145)
TCO2: 17 mmol/L — ABNORMAL LOW (ref 22–32)
pCO2 arterial: 25.7 mmHg — ABNORMAL LOW (ref 32–48)
pH, Arterial: 7.391 (ref 7.35–7.45)
pO2, Arterial: 57 mmHg — ABNORMAL LOW (ref 83–108)

## 2022-05-16 LAB — LACTIC ACID, PLASMA: Lactic Acid, Venous: 1.2 mmol/L (ref 0.5–1.9)

## 2022-05-16 SURGERY — Surgical Case
Anesthesia: Epidural | Site: Abdomen | Wound class: Clean Contaminated

## 2022-05-16 MED ORDER — ONDANSETRON HCL 4 MG/2ML IJ SOLN: 4.0000 mg | Freq: Once | INTRAMUSCULAR | Status: DC | PRN

## 2022-05-16 MED ORDER — KETOROLAC TROMETHAMINE 30 MG/ML IJ SOLN: 30.0000 mg | Freq: Four times a day (QID) | INTRAMUSCULAR | Status: AC | PRN

## 2022-05-16 MED ORDER — FENTANYL CITRATE (PF) 100 MCG/2ML IJ SOLN
50.0000 ug | Freq: Once | INTRAMUSCULAR | Status: AC
  Administered 2022-05-16: 50 ug via INTRAVENOUS
  Filled 2022-05-16: qty 2

## 2022-05-16 MED ORDER — OXYTOCIN BOLUS FROM INFUSION: 333.0000 mL | Freq: Once | INTRAVENOUS | Status: DC

## 2022-05-16 MED ORDER — DEXTROSE 50 % IV SOLN
0.0000 mL | INTRAVENOUS | Status: DC | PRN
  Administered 2022-05-16 (×2): 50 mL via INTRAVENOUS
  Filled 2022-05-16 (×2): qty 50

## 2022-05-16 MED ORDER — OXYCODONE-ACETAMINOPHEN 5-325 MG PO TABS: 2.0000 | ORAL_TABLET | ORAL | Status: DC | PRN

## 2022-05-16 MED ORDER — ACETAMINOPHEN 325 MG PO TABS
650.0000 mg | ORAL_TABLET | Freq: Four times a day (QID) | ORAL | Status: DC | PRN
  Administered 2022-05-16: 650 mg via ORAL
  Filled 2022-05-16: qty 2

## 2022-05-16 MED ORDER — SCOPOLAMINE 1 MG/3DAYS TD PT72
1.0000 | MEDICATED_PATCH | Freq: Once | TRANSDERMAL | Status: DC
  Administered 2022-05-16: 1.5 mg via TRANSDERMAL

## 2022-05-16 MED ORDER — LACTATED RINGERS IV SOLN: INTRAVENOUS | Status: DC

## 2022-05-16 MED ORDER — DIPHENHYDRAMINE HCL 25 MG PO CAPS: 25.0000 mg | ORAL_CAPSULE | ORAL | Status: DC | PRN

## 2022-05-16 MED ORDER — DEXTROSE IN LACTATED RINGERS 5 % IV SOLN: INTRAVENOUS | Status: DC

## 2022-05-16 MED ORDER — OXYTOCIN-SODIUM CHLORIDE 30-0.9 UT/500ML-% IV SOLN
2.5000 [IU]/h | INTRAVENOUS | Status: DC
  Filled 2022-05-16: qty 500

## 2022-05-16 MED ORDER — GENTAMICIN SULFATE 40 MG/ML IJ SOLN
5.0000 mg/kg | INTRAVENOUS | Status: DC
  Administered 2022-05-16: 260 mg via INTRAVENOUS
  Filled 2022-05-16: qty 6.5

## 2022-05-16 MED ORDER — CLINDAMYCIN PHOSPHATE 900 MG/50ML IV SOLN
900.0000 mg | Freq: Three times a day (TID) | INTRAVENOUS | Status: DC
  Administered 2022-05-17 – 2022-05-18 (×4): 900 mg via INTRAVENOUS
  Filled 2022-05-16 (×4): qty 50

## 2022-05-16 MED ORDER — INSULIN GLARGINE-YFGN 100 UNIT/ML ~~LOC~~ SOLN
5.0000 [IU] | Freq: Two times a day (BID) | SUBCUTANEOUS | Status: DC
  Filled 2022-05-16 (×4): qty 0.05

## 2022-05-16 MED ORDER — SCOPOLAMINE 1 MG/3DAYS TD PT72
MEDICATED_PATCH | TRANSDERMAL | Status: AC
  Filled 2022-05-16: qty 1

## 2022-05-16 MED ORDER — DIPHENHYDRAMINE HCL 50 MG/ML IJ SOLN: 12.5000 mg | INTRAMUSCULAR | Status: DC | PRN

## 2022-05-16 MED ORDER — SODIUM CHLORIDE 0.9 % IV SOLN
INTRAVENOUS | Status: AC
  Filled 2022-05-16: qty 2000

## 2022-05-16 MED ORDER — ACETAMINOPHEN 500 MG PO TABS
1000.0000 mg | ORAL_TABLET | Freq: Four times a day (QID) | ORAL | Status: DC
  Administered 2022-05-17: 1000 mg via ORAL
  Filled 2022-05-16 (×2): qty 2

## 2022-05-16 MED ORDER — MORPHINE SULFATE (PF) 0.5 MG/ML IJ SOLN
INTRAMUSCULAR | Status: DC | PRN
  Administered 2022-05-16: 3 mg via EPIDURAL

## 2022-05-16 MED ORDER — FENTANYL CITRATE (PF) 100 MCG/2ML IJ SOLN
INTRAMUSCULAR | Status: DC | PRN
  Administered 2022-05-16: 100 ug via EPIDURAL

## 2022-05-16 MED ORDER — KETOROLAC TROMETHAMINE 30 MG/ML IJ SOLN
30.0000 mg | Freq: Once | INTRAMUSCULAR | Status: AC | PRN
  Administered 2022-05-16: 30 mg via INTRAVENOUS

## 2022-05-16 MED ORDER — SOD CITRATE-CITRIC ACID 500-334 MG/5ML PO SOLN
30.0000 mL | ORAL | Status: DC | PRN
  Filled 2022-05-16: qty 30

## 2022-05-16 MED ORDER — MEPERIDINE HCL 25 MG/ML IJ SOLN: 6.2500 mg | INTRAMUSCULAR | Status: DC | PRN

## 2022-05-16 MED ORDER — EPHEDRINE 5 MG/ML INJ: 10.0000 mg | INTRAVENOUS | Status: DC | PRN

## 2022-05-16 MED ORDER — SODIUM CHLORIDE 0.9 % IV SOLN
2.0000 g | Freq: Four times a day (QID) | INTRAVENOUS | Status: DC
  Administered 2022-05-16 – 2022-05-18 (×6): 2 g via INTRAVENOUS
  Filled 2022-05-16 (×5): qty 2000

## 2022-05-16 MED ORDER — OXYCODONE HCL 5 MG/5ML PO SOLN: 5.0000 mg | Freq: Once | ORAL | Status: DC | PRN

## 2022-05-16 MED ORDER — ACETAMINOPHEN 325 MG PO TABS: 650.0000 mg | ORAL_TABLET | ORAL | Status: DC | PRN

## 2022-05-16 MED ORDER — LIDOCAINE-EPINEPHRINE (PF) 2 %-1:200000 IJ SOLN
INTRAMUSCULAR | Status: DC | PRN
  Administered 2022-05-16: 6 mL via EPIDURAL

## 2022-05-16 MED ORDER — INSULIN ASPART 100 UNIT/ML IJ SOLN: 0.0000 [IU] | INTRAMUSCULAR | Status: DC

## 2022-05-16 MED ORDER — PHENYLEPHRINE 80 MCG/ML (10ML) SYRINGE FOR IV PUSH (FOR BLOOD PRESSURE SUPPORT)
80.0000 ug | PREFILLED_SYRINGE | INTRAVENOUS | Status: DC | PRN
  Administered 2022-05-16 (×2): 80 ug via INTRAVENOUS

## 2022-05-16 MED ORDER — NIFEDIPINE ER OSMOTIC RELEASE 30 MG PO TB24
30.0000 mg | ORAL_TABLET | Freq: Every day | ORAL | Status: DC
  Filled 2022-05-16: qty 1

## 2022-05-16 MED ORDER — LIDOCAINE HCL (PF) 1 % IJ SOLN
INTRAMUSCULAR | Status: AC
  Filled 2022-05-16: qty 5

## 2022-05-16 MED ORDER — INSULIN REGULAR(HUMAN) IN NACL 100-0.9 UT/100ML-% IV SOLN
INTRAVENOUS | Status: DC
  Administered 2022-05-16: 6.5 [IU]/h via INTRAVENOUS
  Filled 2022-05-16: qty 100

## 2022-05-16 MED ORDER — LIDOCAINE HCL (PF) 1 % IJ SOLN
INTRAMUSCULAR | Status: DC | PRN
  Administered 2022-05-16: 10 mL via EPIDURAL
  Administered 2022-05-16: 2 mL via EPIDURAL

## 2022-05-16 MED ORDER — INSULIN ASPART 100 UNIT/ML IJ SOLN
5.0000 [IU] | Freq: Once | INTRAMUSCULAR | Status: AC
  Administered 2022-05-16: 5 [IU] via SUBCUTANEOUS

## 2022-05-16 MED ORDER — SOD CITRATE-CITRIC ACID 500-334 MG/5ML PO SOLN
30.0000 mL | ORAL | Status: AC
Start: 2022-05-16 — End: 2022-05-16
  Administered 2022-05-16: 30 mL via ORAL

## 2022-05-16 MED ORDER — DEXTROSE 50 % IV SOLN
INTRAVENOUS | Status: AC
  Filled 2022-05-16: qty 50

## 2022-05-16 MED ORDER — SODIUM CHLORIDE 0.9 % IV SOLN
500.0000 mg | INTRAVENOUS | Status: AC
  Administered 2022-05-16: 500 mg via INTRAVENOUS

## 2022-05-16 MED ORDER — DEXTROSE 50 % IV SOLN
INTRAVENOUS | Status: DC | PRN
  Administered 2022-05-16: 12.5 mL via INTRAVENOUS
  Administered 2022-05-16: 25 mL via INTRAVENOUS

## 2022-05-16 MED ORDER — STERILE WATER FOR IRRIGATION IR SOLN
Status: DC | PRN
  Administered 2022-05-16: 1

## 2022-05-16 MED ORDER — SODIUM CHLORIDE 0.9 % IV SOLN
INTRAVENOUS | Status: AC
  Filled 2022-05-16: qty 5

## 2022-05-16 MED ORDER — FENTANYL CITRATE (PF) 100 MCG/2ML IJ SOLN
INTRAMUSCULAR | Status: AC
  Filled 2022-05-16: qty 2

## 2022-05-16 MED ORDER — SODIUM CHLORIDE 0.9 % IR SOLN
Status: DC | PRN
  Administered 2022-05-16: 1

## 2022-05-16 MED ORDER — AMISULPRIDE (ANTIEMETIC) 5 MG/2ML IV SOLN: 10.0000 mg | Freq: Once | INTRAVENOUS | Status: DC | PRN

## 2022-05-16 MED ORDER — LIDOCAINE HCL (PF) 1 % IJ SOLN: 30.0000 mL | INTRAMUSCULAR | Status: DC | PRN

## 2022-05-16 MED ORDER — LACTATED RINGERS IV SOLN
500.0000 mL | Freq: Once | INTRAVENOUS | Status: AC
  Administered 2022-05-16: 500 mL via INTRAVENOUS

## 2022-05-16 MED ORDER — FENTANYL-BUPIVACAINE-NACL 0.5-0.125-0.9 MG/250ML-% EP SOLN
EPIDURAL | Status: DC | PRN
  Administered 2022-05-16: 12 mL/h via EPIDURAL

## 2022-05-16 MED ORDER — LACTATED RINGERS IV SOLN: 500.0000 mL | INTRAVENOUS | Status: DC | PRN

## 2022-05-16 MED ORDER — KETOROLAC TROMETHAMINE 30 MG/ML IJ SOLN
INTRAMUSCULAR | Status: AC
  Filled 2022-05-16: qty 1

## 2022-05-16 MED ORDER — FENTANYL CITRATE (PF) 100 MCG/2ML IJ SOLN: 50.0000 ug | INTRAMUSCULAR | Status: DC | PRN

## 2022-05-16 MED ORDER — FENTANYL-BUPIVACAINE-NACL 0.5-0.125-0.9 MG/250ML-% EP SOLN
12.0000 mL/h | EPIDURAL | Status: DC | PRN
  Filled 2022-05-16: qty 250

## 2022-05-16 MED ORDER — MORPHINE SULFATE (PF) 0.5 MG/ML IJ SOLN
INTRAMUSCULAR | Status: AC
  Filled 2022-05-16: qty 10

## 2022-05-16 MED ORDER — INSULIN REGULAR(HUMAN) IN NACL 100-0.9 UT/100ML-% IV SOLN: INTRAVENOUS | Status: DC

## 2022-05-16 MED ORDER — OXYTOCIN-SODIUM CHLORIDE 30-0.9 UT/500ML-% IV SOLN
INTRAVENOUS | Status: DC | PRN
  Administered 2022-05-16: 250 mL/h via INTRAVENOUS

## 2022-05-16 MED ORDER — INSULIN REGULAR(HUMAN) IN NACL 100-0.9 UT/100ML-% IV SOLN
INTRAVENOUS | Status: DC
  Filled 2022-05-16: qty 100

## 2022-05-16 MED ORDER — PHENYLEPHRINE 80 MCG/ML (10ML) SYRINGE FOR IV PUSH (FOR BLOOD PRESSURE SUPPORT)
80.0000 ug | PREFILLED_SYRINGE | INTRAVENOUS | Status: DC | PRN
  Filled 2022-05-16: qty 10

## 2022-05-16 MED ORDER — ONDANSETRON HCL 4 MG/2ML IJ SOLN
INTRAMUSCULAR | Status: DC | PRN
  Administered 2022-05-16: 4 mg via INTRAVENOUS

## 2022-05-16 MED ORDER — NALOXONE HCL 4 MG/10ML IJ SOLN: 1.0000 ug/kg/h | INTRAVENOUS | Status: DC | PRN

## 2022-05-16 MED ORDER — TRANEXAMIC ACID-NACL 1000-0.7 MG/100ML-% IV SOLN
INTRAVENOUS | Status: DC | PRN
  Administered 2022-05-16: 1000 mg via INTRAVENOUS

## 2022-05-16 MED ORDER — OXYCODONE-ACETAMINOPHEN 5-325 MG PO TABS: 1.0000 | ORAL_TABLET | ORAL | Status: DC | PRN

## 2022-05-16 MED ORDER — DEXTROSE 50 % IV SOLN: 0.0000 mL | INTRAVENOUS | Status: DC | PRN

## 2022-05-16 MED ORDER — EPHEDRINE 5 MG/ML INJ
10.0000 mg | INTRAVENOUS | Status: DC | PRN
  Filled 2022-05-16: qty 5

## 2022-05-16 MED ORDER — HYDROMORPHONE HCL 1 MG/ML IJ SOLN: 0.2500 mg | INTRAMUSCULAR | Status: DC | PRN

## 2022-05-16 MED ORDER — ONDANSETRON HCL 4 MG/2ML IJ SOLN: 4.0000 mg | Freq: Four times a day (QID) | INTRAMUSCULAR | Status: DC | PRN

## 2022-05-16 MED ORDER — SODIUM CHLORIDE 0.9 % IV SOLN: INTRAVENOUS | Status: DC

## 2022-05-16 MED ORDER — SODIUM CHLORIDE 0.9% FLUSH: 3.0000 mL | INTRAVENOUS | Status: DC | PRN

## 2022-05-16 MED ORDER — ONDANSETRON HCL 4 MG/2ML IJ SOLN: 4.0000 mg | Freq: Three times a day (TID) | INTRAMUSCULAR | Status: DC | PRN

## 2022-05-16 MED ORDER — PHENYLEPHRINE 80 MCG/ML (10ML) SYRINGE FOR IV PUSH (FOR BLOOD PRESSURE SUPPORT)
PREFILLED_SYRINGE | INTRAVENOUS | Status: AC
  Filled 2022-05-16: qty 10

## 2022-05-16 MED ORDER — NALOXONE HCL 0.4 MG/ML IJ SOLN: 0.4000 mg | INTRAMUSCULAR | Status: DC | PRN

## 2022-05-16 MED ORDER — OXYCODONE HCL 5 MG PO TABS: 5.0000 mg | ORAL_TABLET | Freq: Once | ORAL | Status: DC | PRN

## 2022-05-16 SURGICAL SUPPLY — 32 items
BENZOIN TINCTURE PRP APPL 2/3 (GAUZE/BANDAGES/DRESSINGS) IMPLANT
CHLORAPREP W/TINT 26 (MISCELLANEOUS) ×4 IMPLANT
CLAMP UMBILICAL CORD (MISCELLANEOUS) ×2 IMPLANT
CLOTH BEACON ORANGE TIMEOUT ST (SAFETY) ×2 IMPLANT
DRSG OPSITE POSTOP 4X10 (GAUZE/BANDAGES/DRESSINGS) ×2 IMPLANT
ELECT REM PT RETURN 9FT ADLT (ELECTROSURGICAL) ×2
ELECTRODE REM PT RTRN 9FT ADLT (ELECTROSURGICAL) ×1 IMPLANT
EXTRACTOR VACUUM KIWI (MISCELLANEOUS) IMPLANT
GAUZE SPONGE 4X4 12PLY STRL LF (GAUZE/BANDAGES/DRESSINGS) ×2 IMPLANT
GLOVE SURG ORTHO 8.0 STRL STRW (GLOVE) ×2 IMPLANT
GOWN STRL REUS W/TWL LRG LVL3 (GOWN DISPOSABLE) ×4 IMPLANT
KIT ABG SYR 3ML LUER SLIP (SYRINGE) IMPLANT
NDL HYPO 25X5/8 SAFETYGLIDE (NEEDLE) IMPLANT
NEEDLE HYPO 25X5/8 SAFETYGLIDE (NEEDLE) IMPLANT
NS IRRIG 1000ML POUR BTL (IV SOLUTION) ×2 IMPLANT
PACK C SECTION WH (CUSTOM PROCEDURE TRAY) ×2 IMPLANT
PAD ABD 7.5X8 STRL (GAUZE/BANDAGES/DRESSINGS) ×1 IMPLANT
PAD OB MATERNITY 4.3X12.25 (PERSONAL CARE ITEMS) ×2 IMPLANT
RTRCTR C-SECT PINK 25CM LRG (MISCELLANEOUS) IMPLANT
STRIP CLOSURE SKIN 1/2X4 (GAUZE/BANDAGES/DRESSINGS) IMPLANT
SUT MON AB-0 CT1 36 (SUTURE) ×4 IMPLANT
SUT PLAIN 0 NONE (SUTURE) IMPLANT
SUT VIC AB 0 CT1 27 (SUTURE) ×4
SUT VIC AB 0 CT1 27XBRD ANBCTR (SUTURE) ×2 IMPLANT
SUT VIC AB 0 CT1 36 (SUTURE) ×1 IMPLANT
SUT VIC AB 2-0 CT1 27 (SUTURE) ×2
SUT VIC AB 2-0 CT1 TAPERPNT 27 (SUTURE) ×1 IMPLANT
SUT VIC AB 4-0 SH 27 (SUTURE) ×2
SUT VIC AB 4-0 SH 27XANBCTRL (SUTURE) ×1 IMPLANT
TOWEL OR 17X24 6PK STRL BLUE (TOWEL DISPOSABLE) ×2 IMPLANT
TRAY FOLEY W/BAG SLVR 14FR LF (SET/KITS/TRAYS/PACK) ×2 IMPLANT
WATER STERILE IRR 1000ML POUR (IV SOLUTION) ×2 IMPLANT

## 2022-05-16 NOTE — Progress Notes (Signed)
Discussed with Dr Donavan Foil concern about baseline fhr starting at 135 to 140 with moderate varibility/accels to presently a baseline of 95-100 with minimal varibility.  Informed MD pt has had three IV boluses totalling 1500 cc of fluid and position changes  Provider said he would review strip

## 2022-05-16 NOTE — Progress Notes (Signed)
FACULTY PRACTICE ANTEPARTUM COMPREHENSIVE PROGRESS NOTE  Kristin Clay is a 36 y.o. [id number] at [redacted]w[redacted]d who is admitted for PPROM in the setting of known T1DM and CHTN.  Estimated Date of Delivery: 07/03/22 Fetal presentation is cephalic.  Length of Stay:  11 Days. Admitted 05/05/2022  Subjective:  Patient notes back pain and reports cramping every 10 minutes.  Patient reports good fetal movement.  She reports no bleeding and no loss of fluid per vagina.  Vitals:  Blood pressure 123/77, pulse 90, temperature 98.5 F (36.9 C), temperature source Oral, resp. rate 17, height 5\' 2"  (1.575 m), weight 57.2 kg, last menstrual period 09/20/2021, SpO2 100 %, not currently breastfeeding. Physical Examination: CONSTITUTIONAL: Well-developed, well-nourished female in mild distress.  NEUROLOGIC: Alert and oriented to person, place, and time. No cranial nerve deficit noted. PSYCHIATRIC: Normal mood and affect. Normal behavior. Normal judgment and thought content. CARDIOVASCULAR: Normal heart rate noted, regular rhythm RESPIRATORY: Effort and breath sounds normal, no problems with respiration noted MUSCULOSKELETAL: Normal range of motion. Trace BLE symmetric edema and no tenderness. 2+ distal pulses. Lower back tender on palpation ABDOMEN: Soft, nontender, nondistended, gravid. CERVIX: Dilation: 1.5 Effacement (%): 50 Station: -3 (-3, -4) Presentation: Vertex Exam by:: Dr. Macon Large Exam done 05/07/2022  Fetal monitoring: FHR: 150 bpm, Variability: moderate, Accelerations: Present, Decelerations: recurrent early appearing decel to 135 for 20s.  Uterine activity: Irritable   Results for orders placed or performed during the hospital encounter of 05/05/22 (from the past 48 hour(s))  Type and screen [facility]     Status: None   Collection Time: 05/14/22 11:50 AM  Result Value Ref Range   ABO/RH(D) A NEG    Antibody Screen POS    Sample Expiration 05/17/2022,2359    Antibody  Identification      PASSIVELY ACQUIRED ANTI-D Performed at [facility], [address], [place], [place] [address]   CBC with Differential/Platelet     Status: None   Collection Time: 05/14/22 11:50 AM  Result Value Ref Range   WBC 5.7 4.0 - 10.5 K/uL   RBC 4.36 3.87 - 5.11 MIL/uL   Hemoglobin 12.6 12.0 - 15.0 g/dL   HCT 74.2 59.5 - 63.8 %   MCV 85.6 80.0 - 100.0 fL   MCH 28.9 26.0 - 34.0 pg   MCHC 33.8 30.0 - 36.0 g/dL   RDW 75.6 43.3 - 29.5 %   Platelets 229 150 - 400 K/uL   nRBC 0.0 0.0 - 0.2 %   Neutrophils Relative % 75 %   Neutro Abs 4.2 1.7 - 7.7 K/uL   Lymphocytes Relative 17 %   Lymphs Abs 1.0 0.7 - 4.0 K/uL   Monocytes Relative 8 %   Monocytes Absolute 0.4 0.1 - 1.0 K/uL   Eosinophils Relative 0 %   Eosinophils Absolute 0.0 0.0 - 0.5 K/uL   Basophils Relative 0 %   Basophils Absolute 0.0 0.0 - 0.1 K/uL   Immature Granulocytes 0 %   Abs Immature Granulocytes 0.02 0.00 - 0.07 K/uL    Comment: Performed at [facility], [address], [place], [place] [address]  Comprehensive metabolic panel     Status: Abnormal   Collection Time: 05/14/22 11:50 AM  Result Value Ref Range   Sodium 135 135 - 145 mmol/L   Potassium 3.9 3.5 - 5.1 mmol/L   Chloride 105 98 - 111 mmol/L   CO2 20 (L) 22 - 32 mmol/L   Glucose, Bld 56 (  L) 70 - 99 mg/dL    Comment: Glucose reference range applies only to samples taken after fasting for at least 8 hours.   BUN 11 6 - 20 mg/dL   Creatinine, Ser 2.13 0.44 - 1.00 mg/dL   Calcium 8.7 (L) 8.9 - 10.3 mg/dL   Total Protein 6.5 6.5 - 8.1 g/dL   Albumin 2.3 (L) 3.5 - 5.0 g/dL   AST 12 (L) 15 - 41 U/L   ALT 9 0 - 44 U/L   Alkaline Phosphatase 83 38 - 126 U/L   Total Bilirubin 0.7 0.3 - 1.2 mg/dL   GFR, Estimated >08 >65 mL/min    Comment: (NOTE) Calculated using the CKD-EPI Creatinine Equation (2021)    Anion gap 10 5 - 15    Comment: Performed at [facility], [address], [place], [place] [address]  Glucose, capillary      Status: Abnormal   Collection Time: 05/14/22  3:27 PM  Result Value Ref Range   Glucose-Capillary 155 (H) 70 - 99 mg/dL    Comment: Glucose reference range applies only to samples taken after fasting for at least 8 hours.  Glucose, capillary     Status: Abnormal   Collection Time: 05/14/22  7:43 PM  Result Value Ref Range   Glucose-Capillary 59 (L) 70 - 99 mg/dL    Comment: Glucose reference range applies only to samples taken after fasting for at least 8 hours.  Glucose, capillary     Status: Abnormal   Collection Time: 05/14/22  9:55 PM  Result Value Ref Range   Glucose-Capillary 184 (H) 70 - 99 mg/dL    Comment: Glucose reference range applies only to samples taken after fasting for at least 8 hours.  Glucose, capillary     Status: Abnormal   Collection Time: 05/15/22  6:00 AM  Result Value Ref Range   Glucose-Capillary 224 (H) 70 - 99 mg/dL    Comment: Glucose reference range applies only to samples taken after fasting for at least 8 hours.  Glucose, capillary     Status: None   Collection Time: 05/15/22  1:46 PM  Result Value Ref Range   Glucose-Capillary 73 70 - 99 mg/dL    Comment: Glucose reference range applies only to samples taken after fasting for at least 8 hours.  Glucose, capillary     Status: Abnormal   Collection Time: 05/15/22  3:35 PM  Result Value Ref Range   Glucose-Capillary 181 (H) 70 - 99 mg/dL    Comment: Glucose reference range applies only to samples taken after fasting for at least 8 hours.  Glucose, capillary     Status: Abnormal   Collection Time: 05/15/22  9:03 PM  Result Value Ref Range   Glucose-Capillary 117 (H) 70 - 99 mg/dL    Comment: Glucose reference range applies only to samples taken after fasting for at least 8 hours.   Comment 1 Notify RN   Glucose, capillary     Status: Abnormal   Collection Time: 05/15/22  9:57 PM  Result Value Ref Range   Glucose-Capillary 138 (H) 70 - 99 mg/dL    Comment: Glucose reference range applies only to  samples taken after fasting for at least 8 hours.   Comment 1 Notify RN   Glucose, capillary     Status: Abnormal   Collection Time: 05/16/22 12:35 AM  Result Value Ref Range   Glucose-Capillary 53 (L) 70 - 99 mg/dL    Comment: Glucose reference range  applies only to samples taken after fasting for at least 8 hours.   Comment 1 Document in Chart   Glucose, capillary     Status: Abnormal   Collection Time: 05/16/22 12:44 AM  Result Value Ref Range   Glucose-Capillary 46 (L) 70 - 99 mg/dL    Comment: Glucose reference range applies only to samples taken after fasting for at least 8 hours.  Glucose, capillary     Status: Abnormal   Collection Time: 05/16/22  1:33 AM  Result Value Ref Range   Glucose-Capillary 213 (H) 70 - 99 mg/dL    Comment: Glucose reference range applies only to samples taken after fasting for at least 8 hours.  Glucose, capillary     Status: Abnormal   Collection Time: 05/16/22  6:06 AM  Result Value Ref Range   Glucose-Capillary 68 (L) 70 - 99 mg/dL    Comment: Glucose reference range applies only to samples taken after fasting for at least 8 hours.    No results found.  Current scheduled medications  insulin aspart  0-14 Units Subcutaneous TID PC   insulin aspart  4 Units Subcutaneous TID WC   insulin NPH Human  25 Units Subcutaneous QAC breakfast   insulin NPH Human  26 Units Subcutaneous QHS   labetalol  400 mg Oral TID   lidocaine  1 patch Transdermal Q24H   NIFEdipine  90 mg Oral Daily   polyethylene glycol  17 g Oral BID   prenatal vitamin w/FE, FA  1 tablet Oral Q1200    I have reviewed the patient's current medications.  ASSESSMENT: Principal Problem:   Preterm premature rupture of membranes (PPROM) with onset of labor after 24 hours of rupture in third trimester, antepartum Active Problems:   Female circumcision   Language barrier   Type 1 diabetes mellitus with ketoacidosis, uncontrolled (HCC)   Supervision of high risk pregnancy,  antepartum   Rh negative, antepartum   Chronic hypertension affecting pregnancy   Short interval between pregnancies affecting pregnancy, antepartum   Previous preterm delivery at 33w6 due to severe preeclampsia, antepartum   History of forceps delivery in prior pregnancy, currently pregnant   PLAN: - CBGs overall well controlled but some episodes of hypoglycemia - continue recommended insulin adjustments as per DM coordinator recommendations, appreciate their input.  - Continue Procardia and Labetalol for CHTN, can change as needed. - She has completed the latency antibiotics, has received BMZ x2,  s/p NICU consult - Reassuring FHR tracing, continue NST bid. 6/19 EFW 14%, BPP 8/8, AFI BPP. Continue weekly BPPs. - Will treat back pain and will have her put on the monitor. Concern that she may be going into labor - would not do anything to stop the process at this time. Recheck CBC with diff to see if any change from 6/22. Otherwise, plan to deliver at 34 weeks or earlier if indicated. GBS neg.   - In terms of birth control, she would like ppIUD (LNG) - She does not want FAVD again. She would prefer cesarean delivery over forceps. Reviewed unlikely she would need either, but would inform other providers.  - Desires circ for baby    - Continue routine antenatal care.   Milas Hock, MD, FACOG Obstetrician & Gynecologist, [facility] for [facility], [facility]

## 2022-05-17 ENCOUNTER — Inpatient Hospital Stay (HOSPITAL_COMMUNITY): Payer: Medicaid Other

## 2022-05-17 ENCOUNTER — Encounter (HOSPITAL_COMMUNITY): Payer: Self-pay | Admitting: Obstetrics and Gynecology

## 2022-05-17 DIAGNOSIS — A419 Sepsis, unspecified organism: Secondary | ICD-10-CM

## 2022-05-17 DIAGNOSIS — O42113 Preterm premature rupture of membranes, onset of labor more than 24 hours following rupture, third trimester: Secondary | ICD-10-CM | POA: Diagnosis not present

## 2022-05-17 DIAGNOSIS — R918 Other nonspecific abnormal finding of lung field: Secondary | ICD-10-CM | POA: Diagnosis not present

## 2022-05-17 LAB — CBC
HCT: 18.3 % — ABNORMAL LOW (ref 36.0–46.0)
HCT: 30.5 % — ABNORMAL LOW (ref 36.0–46.0)
HCT: 29.5 % — ABNORMAL LOW (ref 36.0–46.0)
Hemoglobin: 10.5 g/dL — ABNORMAL LOW (ref 12.0–15.0)
Hemoglobin: 5.4 g/dL — CL (ref 12.0–15.0)
Hemoglobin: 9.9 g/dL — ABNORMAL LOW (ref 12.0–15.0)
MCH: 28.6 pg (ref 26.0–34.0)
MCH: 29.5 pg (ref 26.0–34.0)
MCH: 28.8 pg (ref 26.0–34.0)
MCHC: 29.5 g/dL — ABNORMAL LOW (ref 30.0–36.0)
MCHC: 34.4 g/dL (ref 30.0–36.0)
MCHC: 33.6 g/dL (ref 30.0–36.0)
MCV: 85.7 fL (ref 80.0–100.0)
MCV: 96.8 fL (ref 80.0–100.0)
MCV: 85.8 fL (ref 80.0–100.0)
Platelets: 200 10*3/uL (ref 150–400)
Platelets: 199 10*3/uL (ref 150–400)
RBC: 1.89 MIL/uL — ABNORMAL LOW (ref 3.87–5.11)
RBC: 3.56 MIL/uL — ABNORMAL LOW (ref 3.87–5.11)
RBC: 3.44 MIL/uL — ABNORMAL LOW (ref 3.87–5.11)
RDW: 13.3 % (ref 11.5–15.5)
RDW: 17.6 % — ABNORMAL HIGH (ref 11.5–15.5)
RDW: 13.3 % (ref 11.5–15.5)
WBC: 4.4 10*3/uL (ref 4.0–10.5)
WBC: 8.7 10*3/uL (ref 4.0–10.5)
WBC: 8.3 10*3/uL (ref 4.0–10.5)
nRBC: 0 % (ref 0.0–0.2)
nRBC: 0 % (ref 0.0–0.2)
nRBC: 0 % (ref 0.0–0.2)

## 2022-05-17 LAB — BASIC METABOLIC PANEL
Anion gap: 6 (ref 5–15)
Anion gap: 7 (ref 5–15)
BUN: 7 mg/dL (ref 6–20)
BUN: 5 mg/dL — ABNORMAL LOW (ref 6–20)
CO2: 18 mmol/L — ABNORMAL LOW (ref 22–32)
CO2: 20 mmol/L — ABNORMAL LOW (ref 22–32)
Calcium: 7.4 mg/dL — ABNORMAL LOW (ref 8.9–10.3)
Calcium: 7.7 mg/dL — ABNORMAL LOW (ref 8.9–10.3)
Chloride: 112 mmol/L — ABNORMAL HIGH (ref 98–111)
Chloride: 108 mmol/L (ref 98–111)
Creatinine, Ser: <0.3 mg/dL — ABNORMAL LOW (ref 0.44–1.00)
Creatinine, Ser: 0.3 mg/dL — ABNORMAL LOW (ref 0.44–1.00)
GFR, Estimated: >60 mL/min (ref >60)
Glucose, Bld: 58 mg/dL — ABNORMAL LOW (ref 70–99)
Glucose, Bld: 72 mg/dL (ref 70–99)
Potassium: 3.6 mmol/L (ref 3.5–5.1)
Potassium: 3.7 mmol/L (ref 3.5–5.1)
Sodium: 136 mmol/L (ref 135–145)
Sodium: 135 mmol/L (ref 135–145)

## 2022-05-17 LAB — LACTIC ACID, PLASMA
Lactic Acid, Venous: 0.9 mmol/L (ref 0.5–1.9)
Lactic Acid, Venous: 6.6 mmol/L (ref 0.5–1.9)

## 2022-05-17 LAB — DIC (DISSEMINATED INTRAVASCULAR COAGULATION)PANEL
D-Dimer, Quant: 3.31 ug/mL-FEU — ABNORMAL HIGH (ref 0.00–0.50)
D-Dimer, Quant: 3.89 ug/mL-FEU — ABNORMAL HIGH (ref 0.00–0.50)
Fibrinogen: 620 mg/dL — ABNORMAL HIGH (ref 210–475)
Fibrinogen: 708 mg/dL — ABNORMAL HIGH (ref 210–475)
INR: 1.2 (ref 0.8–1.2)
INR: 1.1 (ref 0.8–1.2)
Platelets: 196 10*3/uL (ref 150–400)
Prothrombin Time: 14.8 seconds (ref 11.4–15.2)
Prothrombin Time: 13.9 seconds (ref 11.4–15.2)
Smear Review: NONE SEEN
aPTT: 32 seconds (ref 24–36)
aPTT: 31 seconds (ref 24–36)

## 2022-05-17 LAB — GLUCOSE, CAPILLARY
Glucose-Capillary: 151 mg/dL — ABNORMAL HIGH (ref 70–99)
Glucose-Capillary: 43 mg/dL — CL (ref 70–99)
Glucose-Capillary: 66 mg/dL — ABNORMAL LOW (ref 70–99)
Glucose-Capillary: 113 mg/dL — ABNORMAL HIGH (ref 70–99)
Glucose-Capillary: 166 mg/dL — ABNORMAL HIGH (ref 70–99)
Glucose-Capillary: 196 mg/dL — ABNORMAL HIGH (ref 70–99)
Glucose-Capillary: 43 mg/dL — CL (ref 70–99)
Glucose-Capillary: 76 mg/dL (ref 70–99)
Glucose-Capillary: 44 mg/dL — CL (ref 70–99)
Glucose-Capillary: 58 mg/dL — ABNORMAL LOW (ref 70–99)
Glucose-Capillary: 109 mg/dL — ABNORMAL HIGH (ref 70–99)
Glucose-Capillary: 150 mg/dL — ABNORMAL HIGH (ref 70–99)

## 2022-05-17 LAB — HEMOGLOBIN AND HEMATOCRIT, BLOOD
HCT: 30.7 % — ABNORMAL LOW (ref 36.0–46.0)
Hemoglobin: 10.4 g/dL — ABNORMAL LOW (ref 12.0–15.0)

## 2022-05-17 LAB — FIBRINOGEN: Fibrinogen: 702 mg/dL — ABNORMAL HIGH (ref 210–475)

## 2022-05-17 LAB — KETONES, URINE: Ketones, ur: NEGATIVE mg/dL

## 2022-05-17 LAB — BETA-HYDROXYBUTYRIC ACID: Beta-Hydroxybutyric Acid: <0.05 mmol/L — ABNORMAL LOW (ref 0.05–0.27)

## 2022-05-17 LAB — RPR: RPR Ser Ql: NONREACTIVE

## 2022-05-17 LAB — PREPARE RBC (CROSSMATCH)

## 2022-05-17 LAB — MRSA NEXT GEN BY PCR, NASAL: MRSA by PCR Next Gen: NOT DETECTED

## 2022-05-17 MED ORDER — MENTHOL 3 MG MT LOZG: 1.0000 | LOZENGE | OROMUCOSAL | Status: DC | PRN

## 2022-05-17 MED ORDER — WITCH HAZEL-GLYCERIN EX PADS: 1.0000 | MEDICATED_PAD | CUTANEOUS | Status: DC | PRN

## 2022-05-17 MED ORDER — ORAL CARE MOUTH RINSE: 15.0000 mL | OROMUCOSAL | Status: DC | PRN

## 2022-05-17 MED ORDER — DEXTROSE 50 % IV SOLN
25.0000 g | INTRAVENOUS | Status: AC
Start: 2022-05-17 — End: 2022-05-17

## 2022-05-17 MED ORDER — DEXTROSE 50 % IV SOLN: 25.0000 g | INTRAVENOUS | Status: AC

## 2022-05-17 MED ORDER — LACTATED RINGERS IV BOLUS
1000.0000 mL | Freq: Once | INTRAVENOUS | Status: AC
Start: 2022-05-17 — End: 2022-05-17
  Administered 2022-05-17: 1000 mL via INTRAVENOUS

## 2022-05-17 MED ORDER — DIPHENHYDRAMINE HCL 25 MG PO CAPS
25.0000 mg | ORAL_CAPSULE | Freq: Four times a day (QID) | ORAL | Status: DC | PRN
  Administered 2022-05-18: 25 mg via ORAL
  Filled 2022-05-17: qty 1

## 2022-05-17 MED ORDER — DIBUCAINE (PERIANAL) 1 % EX OINT: 1.0000 | TOPICAL_OINTMENT | CUTANEOUS | Status: DC | PRN

## 2022-05-17 MED ORDER — PRENATAL MULTIVITAMIN CH
1.0000 | ORAL_TABLET | Freq: Every day | ORAL | Status: DC
  Administered 2022-05-17 – 2022-05-19 (×3): 1 via ORAL
  Filled 2022-05-17 (×4): qty 1

## 2022-05-17 MED ORDER — ZOLPIDEM TARTRATE 5 MG PO TABS: 5.0000 mg | ORAL_TABLET | Freq: Every evening | ORAL | Status: DC | PRN

## 2022-05-17 MED ORDER — SIMETHICONE 80 MG PO CHEW: 80.0000 mg | CHEWABLE_TABLET | ORAL | Status: DC | PRN

## 2022-05-17 MED ORDER — DIPHENHYDRAMINE HCL 25 MG PO CAPS
25.0000 mg | ORAL_CAPSULE | Freq: Once | ORAL | Status: AC
  Administered 2022-05-17: 25 mg via ORAL
  Filled 2022-05-17: qty 1

## 2022-05-17 MED ORDER — DEXTROSE IN LACTATED RINGERS 5 % IV SOLN: INTRAVENOUS | Status: DC

## 2022-05-17 MED ORDER — TETANUS-DIPHTH-ACELL PERTUSSIS 5-2.5-18.5 LF-MCG/0.5 IM SUSY: 0.5000 mL | PREFILLED_SYRINGE | Freq: Once | INTRAMUSCULAR | Status: DC

## 2022-05-17 MED ORDER — ACETAMINOPHEN 325 MG PO TABS: 650.0000 mg | ORAL_TABLET | Freq: Once | ORAL | Status: DC

## 2022-05-17 MED ORDER — RHO D IMMUNE GLOBULIN 1500 UNIT/2ML IJ SOSY
300.0000 ug | PREFILLED_SYRINGE | Freq: Once | INTRAMUSCULAR | Status: DC
  Filled 2022-05-17: qty 2

## 2022-05-17 MED ORDER — SIMETHICONE 80 MG PO CHEW
80.0000 mg | CHEWABLE_TABLET | Freq: Three times a day (TID) | ORAL | Status: DC
Start: 2022-05-17 — End: 2022-05-19
  Administered 2022-05-17 – 2022-05-19 (×7): 80 mg via ORAL
  Filled 2022-05-17 (×8): qty 1

## 2022-05-17 MED ORDER — SODIUM CHLORIDE 0.9% IV SOLUTION: Freq: Once | INTRAVENOUS | Status: DC

## 2022-05-17 MED ORDER — LABETALOL HCL 200 MG PO TABS
400.0000 mg | ORAL_TABLET | Freq: Three times a day (TID) | ORAL | Status: DC
  Administered 2022-05-17: 400 mg via ORAL
  Filled 2022-05-17: qty 2

## 2022-05-17 MED ORDER — OXYTOCIN-SODIUM CHLORIDE 30-0.9 UT/500ML-% IV SOLN
2.5000 [IU]/h | INTRAVENOUS | Status: AC
  Administered 2022-05-17: 2.5 [IU]/h via INTRAVENOUS
  Filled 2022-05-17 (×2): qty 500

## 2022-05-17 MED ORDER — DEXTROSE 50 % IV SOLN
INTRAVENOUS | Status: AC
  Administered 2022-05-17: 12.5 g via INTRAVENOUS
  Filled 2022-05-17: qty 50

## 2022-05-17 MED ORDER — OXYCODONE HCL 5 MG PO TABS
5.0000 mg | ORAL_TABLET | ORAL | Status: DC | PRN
  Administered 2022-05-19: 5 mg via ORAL
  Filled 2022-05-17: qty 1

## 2022-05-17 MED ORDER — NIFEDIPINE ER OSMOTIC RELEASE 60 MG PO TB24
90.0000 mg | ORAL_TABLET | Freq: Every day | ORAL | Status: DC
  Administered 2022-05-18: 90 mg via ORAL
  Filled 2022-05-17: qty 1

## 2022-05-17 MED ORDER — DEXTROSE 50 % IV SOLN
INTRAVENOUS | Status: AC
  Administered 2022-05-17: 50 mL
  Filled 2022-05-17: qty 50

## 2022-05-17 MED ORDER — SENNOSIDES-DOCUSATE SODIUM 8.6-50 MG PO TABS
2.0000 | ORAL_TABLET | Freq: Every day | ORAL | Status: DC
Start: 2022-05-18 — End: 2022-05-19
  Administered 2022-05-18: 2 via ORAL
  Filled 2022-05-17 (×2): qty 2

## 2022-05-17 MED ORDER — DEXTROSE 50 % IV SOLN
12.5000 g | INTRAVENOUS | Status: AC
Start: 2022-05-17 — End: 2022-05-18

## 2022-05-17 MED ORDER — ACETAMINOPHEN 325 MG PO TABS
650.0000 mg | ORAL_TABLET | ORAL | Status: DC | PRN
  Administered 2022-05-18 (×2): 650 mg via ORAL
  Filled 2022-05-17 (×2): qty 2

## 2022-05-17 MED ORDER — LISINOPRIL 10 MG PO TABS: 5.0000 mg | ORAL_TABLET | Freq: Every day | ORAL | Status: DC

## 2022-05-17 MED ORDER — LACTATED RINGERS IV SOLN: INTRAVENOUS | Status: DC

## 2022-05-17 MED ORDER — CHLORHEXIDINE GLUCONATE CLOTH 2 % EX PADS
6.0000 | MEDICATED_PAD | Freq: Every day | CUTANEOUS | Status: DC
  Administered 2022-05-17: 6 via TOPICAL

## 2022-05-17 MED ORDER — GENTAMICIN SULFATE 40 MG/ML IJ SOLN
5.0000 mg/kg | INTRAVENOUS | Status: DC
  Administered 2022-05-17: 260 mg via INTRAVENOUS
  Filled 2022-05-17 (×2): qty 6.5

## 2022-05-17 MED ORDER — NIFEDIPINE ER OSMOTIC RELEASE 60 MG PO TB24
60.0000 mg | ORAL_TABLET | Freq: Every day | ORAL | Status: AC
  Administered 2022-05-17: 60 mg via ORAL
  Filled 2022-05-17: qty 1

## 2022-05-17 MED ORDER — COCONUT OIL OIL: 1.0000 | TOPICAL_OIL | Status: DC | PRN

## 2022-05-17 MED ORDER — INSULIN ASPART 100 UNIT/ML IJ SOLN
0.0000 [IU] | Freq: Three times a day (TID) | INTRAMUSCULAR | Status: DC
  Administered 2022-05-17: 2 [IU] via SUBCUTANEOUS
  Administered 2022-05-18 (×4): 3 [IU] via SUBCUTANEOUS
  Administered 2022-05-19: 5 [IU] via SUBCUTANEOUS
  Administered 2022-05-19: 3 [IU] via SUBCUTANEOUS

## 2022-05-17 MED ORDER — DEXTROSE 50 % IV SOLN: 12.5000 g | INTRAVENOUS | Status: AC

## 2022-05-17 MED ORDER — DEXTROSE 50 % IV SOLN
12.5000 g | INTRAVENOUS | Status: AC
  Administered 2022-05-17: 12.5 g via INTRAVENOUS
  Filled 2022-05-17: qty 50

## 2022-05-17 NOTE — Progress Notes (Signed)
Patient with sustained SBP > 160-170s. One of the two of patients BP home medications, Procardia 60 MG resumed and given @ 0108. Paged and discussed with Donavan Foil MD regarding patients SBP unchanged after dose given. MD states ok with SBP < 175 and will restart the other BP medication @ 1000 today. No new orders at this time.   Barbaraann Cao, RN  05/17/2022 [phone]

## 2022-05-17 NOTE — Progress Notes (Signed)
Hypoglycemic Event  CBG: 43  Treatment: D50 50 mL (25 gm)  Symptoms:  lethargic  Follow-up CBG: Time:1215 CBG Result:196  Possible Reasons for Event: Inadequate meal intake and Other: D5LR stopped while patient was transported  Comments/MD notified:Dr. Macon Large    Milagros Reap

## 2022-05-18 ENCOUNTER — Encounter: Payer: Medicaid Other | Admitting: Obstetrics and Gynecology

## 2022-05-18 DIAGNOSIS — Z3A00 Weeks of gestation of pregnancy not specified: Secondary | ICD-10-CM | POA: Diagnosis not present

## 2022-05-18 DIAGNOSIS — O411230 Chorioamnionitis, third trimester, not applicable or unspecified: Secondary | ICD-10-CM | POA: Diagnosis not present

## 2022-05-18 DIAGNOSIS — Z3400 Encounter for supervision of normal first pregnancy, unspecified trimester: Secondary | ICD-10-CM | POA: Diagnosis not present

## 2022-05-18 LAB — TYPE AND SCREEN
ABO/RH(D): A NEG
Antibody Screen: POSITIVE
Unit division: 0
Unit division: 0

## 2022-05-18 LAB — CBC WITH DIFFERENTIAL/PLATELET
Abs Immature Granulocytes: 0.12 10*3/uL — ABNORMAL HIGH (ref 0.00–0.07)
Basophils Absolute: 0 10*3/uL (ref 0.0–0.1)
Basophils Relative: 0 %
Eosinophils Absolute: 0 10*3/uL (ref 0.0–0.5)
Eosinophils Relative: 0 %
HCT: 27.6 % — ABNORMAL LOW (ref 36.0–46.0)
Hemoglobin: 9.3 g/dL — ABNORMAL LOW (ref 12.0–15.0)
Immature Granulocytes: 1 %
Lymphocytes Relative: 14 %
Lymphs Abs: 1.3 10*3/uL (ref 0.7–4.0)
MCH: 29.2 pg (ref 26.0–34.0)
MCHC: 33.7 g/dL (ref 30.0–36.0)
MCV: 86.8 fL (ref 80.0–100.0)
Monocytes Absolute: 0.6 10*3/uL (ref 0.1–1.0)
Monocytes Relative: 6 %
Neutro Abs: 7.8 10*3/uL — ABNORMAL HIGH (ref 1.7–7.7)
Neutrophils Relative %: 79 %
Platelets: 236 10*3/uL (ref 150–400)
RBC: 3.18 MIL/uL — ABNORMAL LOW (ref 3.87–5.11)
RDW: 13.4 % (ref 11.5–15.5)
WBC: 9.9 10*3/uL (ref 4.0–10.5)
nRBC: 0 % (ref 0.0–0.2)

## 2022-05-18 LAB — BPAM RBC
Blood Product Expiration Date: 202307122359
Blood Product Expiration Date: 202307122359
ISSUE DATE / TIME: 202306250123
ISSUE DATE / TIME: 202306250123
Unit Type and Rh: 600
Unit Type and Rh: 600

## 2022-05-18 LAB — BASIC METABOLIC PANEL
Anion gap: 9 (ref 5–15)
BUN: 8 mg/dL (ref 6–20)
CO2: 22 mmol/L (ref 22–32)
Calcium: 8.5 mg/dL — ABNORMAL LOW (ref 8.9–10.3)
Chloride: 103 mmol/L (ref 98–111)
Creatinine, Ser: 0.56 mg/dL (ref 0.44–1.00)
GFR, Estimated: >60 mL/min (ref >60)
Glucose, Bld: 157 mg/dL — ABNORMAL HIGH (ref 70–99)
Potassium: 3.4 mmol/L — ABNORMAL LOW (ref 3.5–5.1)
Sodium: 134 mmol/L — ABNORMAL LOW (ref 135–145)

## 2022-05-18 LAB — GLUCOSE, CAPILLARY
Glucose-Capillary: 69 mg/dL — ABNORMAL LOW (ref 70–99)
Glucose-Capillary: 87 mg/dL (ref 70–99)
Glucose-Capillary: 181 mg/dL — ABNORMAL HIGH (ref 70–99)
Glucose-Capillary: 166 mg/dL — ABNORMAL HIGH (ref 70–99)
Glucose-Capillary: 172 mg/dL — ABNORMAL HIGH (ref 70–99)
Glucose-Capillary: 183 mg/dL — ABNORMAL HIGH (ref 70–99)

## 2022-05-18 LAB — URINE CULTURE: Culture: NO GROWTH

## 2022-05-18 MED ORDER — LISINOPRIL 10 MG PO TABS
10.0000 mg | ORAL_TABLET | Freq: Every day | ORAL | Status: DC
  Administered 2022-05-18 – 2022-05-19 (×2): 10 mg via ORAL
  Filled 2022-05-18 (×2): qty 1

## 2022-05-18 MED ORDER — INSULIN GLARGINE-YFGN 100 UNIT/ML ~~LOC~~ SOLN
5.0000 [IU] | Freq: Every day | SUBCUTANEOUS | Status: DC
  Administered 2022-05-18: 5 [IU] via SUBCUTANEOUS
  Filled 2022-05-18 (×2): qty 0.05

## 2022-05-18 MED ORDER — RHO D IMMUNE GLOBULIN 1500 UNIT/2ML IJ SOSY
300.0000 ug | PREFILLED_SYRINGE | Freq: Once | INTRAMUSCULAR | Status: AC
  Administered 2022-05-18: 300 ug via INTRAMUSCULAR
  Filled 2022-05-18: qty 2

## 2022-05-19 ENCOUNTER — Encounter (HOSPITAL_COMMUNITY): Payer: Self-pay | Admitting: Obstetrics & Gynecology

## 2022-05-19 ENCOUNTER — Other Ambulatory Visit (HOSPITAL_COMMUNITY): Payer: Self-pay

## 2022-05-19 LAB — URINALYSIS, ROUTINE W REFLEX MICROSCOPIC
Bacteria, UA: NONE SEEN
Bilirubin Urine: NEGATIVE
Glucose, UA: >=500 mg/dL — AB
Hgb urine dipstick: NEGATIVE
Ketones, ur: NEGATIVE mg/dL
Leukocytes,Ua: NEGATIVE
Nitrite: NEGATIVE
Protein, ur: NEGATIVE mg/dL
Specific Gravity, Urine: 1.007 (ref 1.005–1.030)
pH: 7 (ref 5.0–8.0)

## 2022-05-19 LAB — RH IG WORKUP (INCLUDES ABO/RH)
Antibody Screen: POSITIVE
Fetal Screen: NEGATIVE
Gestational Age(Wks): 33
Unit division: 0

## 2022-05-19 LAB — SURGICAL PATHOLOGY

## 2022-05-19 LAB — GLUCOSE, CAPILLARY
Glucose-Capillary: 212 mg/dL — ABNORMAL HIGH (ref 70–99)
Glucose-Capillary: 162 mg/dL — ABNORMAL HIGH (ref 70–99)

## 2022-05-19 MED ORDER — INSULIN GLARGINE-YFGN 100 UNIT/ML ~~LOC~~ SOLN
5.0000 [IU] | Freq: Two times a day (BID) | SUBCUTANEOUS | Status: DC
  Administered 2022-05-19: 5 [IU] via SUBCUTANEOUS
  Filled 2022-05-19 (×2): qty 0.05

## 2022-05-19 MED ORDER — NIFEDIPINE ER OSMOTIC RELEASE 60 MG PO TB24
60.0000 mg | ORAL_TABLET | Freq: Two times a day (BID) | ORAL | Status: DC
  Administered 2022-05-19: 60 mg via ORAL
  Filled 2022-05-19: qty 1

## 2022-05-19 MED ORDER — INSULIN PEN NEEDLE 32G X 4 MM MISC
0 refills | Status: DC
  Filled 2022-05-19: qty 100, 25d supply, fill #0

## 2022-05-19 MED ORDER — INSULIN GLARGINE 100 UNIT/ML SOLOSTAR PEN
10.0000 [IU] | PEN_INJECTOR | Freq: Every day | SUBCUTANEOUS | 6 refills | Status: DC
  Filled 2022-05-19: qty 6, 27d supply, fill #0

## 2022-05-19 MED ORDER — OXYCODONE-ACETAMINOPHEN 5-325 MG PO TABS
1.0000 | ORAL_TABLET | Freq: Four times a day (QID) | ORAL | 0 refills | Status: DC | PRN
  Filled 2022-05-19: qty 20, 3d supply, fill #0

## 2022-05-19 MED ORDER — FUROSEMIDE 20 MG PO TABS
20.0000 mg | ORAL_TABLET | Freq: Every day | ORAL | 0 refills | Status: DC
Start: 2022-05-19 — End: 2022-08-28
  Filled 2022-05-19: qty 5, 5d supply, fill #0

## 2022-05-19 MED ORDER — NIFEDIPINE ER 60 MG PO TB24
60.0000 mg | ORAL_TABLET | Freq: Two times a day (BID) | ORAL | 2 refills | Status: DC
  Filled 2022-05-19: qty 60, 30d supply, fill #0

## 2022-05-19 MED ORDER — NOVOLOG FLEXPEN 100 UNIT/ML ~~LOC~~ SOPN
5.0000 [IU] | PEN_INJECTOR | Freq: Three times a day (TID) | SUBCUTANEOUS | 11 refills | Status: DC
  Filled 2022-05-19: qty 3, 33d supply, fill #0

## 2022-05-19 MED ORDER — LISINOPRIL 10 MG PO TABS
10.0000 mg | ORAL_TABLET | Freq: Every day | ORAL | 1 refills | Status: DC
  Filled 2022-05-19: qty 30, 30d supply, fill #0

## 2022-05-19 MED ORDER — BISACODYL 10 MG RE SUPP
10.0000 mg | Freq: Once | RECTAL | Status: DC
  Filled 2022-05-19: qty 1

## 2022-05-19 NOTE — Plan of Care (Signed)
  Problem: Education: Goal: Individualized Educational Video(s) Outcome: Completed/Met   Problem: Fluid Volume: Goal: Ability to maintain a balanced intake and output will improve Outcome: Completed/Met   Problem: Health Behavior/Discharge Planning: Goal: Ability to identify and utilize available resources and services will improve Outcome: Completed/Met Goal: Ability to manage health-related needs will improve Outcome: Completed/Met   Problem: Metabolic: Goal: Ability to maintain appropriate glucose levels will improve Outcome: Completed/Met   Problem: Nutritional: Goal: Maintenance of adequate nutrition will improve Outcome: Completed/Met   Problem: Skin Integrity: Goal: Risk for impaired skin integrity will decrease Outcome: Completed/Met   Problem: Tissue Perfusion: Goal: Adequacy of tissue perfusion will improve Outcome: Completed/Met   Problem: Health Behavior/Discharge Planning: Goal: Ability to manage health-related needs will improve Outcome: Completed/Met   Problem: Clinical Measurements: Goal: Ability to maintain clinical measurements within normal limits will improve Outcome: Completed/Met Goal: Will remain free from infection Outcome: Completed/Met Goal: Diagnostic test results will improve Outcome: Completed/Met Goal: Respiratory complications will improve Outcome: Completed/Met Goal: Cardiovascular complication will be avoided Outcome: Completed/Met   Problem: Nutrition: Goal: Adequate nutrition will be maintained Outcome: Completed/Met   Problem: Elimination: Goal: Will not experience complications related to bowel motility Outcome: Completed/Met   Problem: Pain Managment: Goal: General experience of comfort will improve Outcome: Completed/Met   Problem: Safety: Goal: Ability to remain free from injury will improve Outcome: Completed/Met   Problem: Skin Integrity: Goal: Risk for impaired skin integrity will decrease Outcome:  Completed/Met   Problem: Fluid Volume: Goal: Hemodynamic stability will improve Outcome: Completed/Met   Problem: Clinical Measurements: Goal: Diagnostic test results will improve Outcome: Completed/Met Goal: Signs and symptoms of infection will decrease Outcome: Completed/Met   Problem: Respiratory: Goal: Ability to maintain adequate ventilation will improve Outcome: Completed/Met   Problem: Skin Integrity: Goal: Demonstration of wound healing without infection will improve Outcome: Completed/Met

## 2022-05-19 NOTE — Inpatient Diabetes Management (Signed)
Inpatient Diabetes Program Recommendations  AACE/ADA: New Consensus Statement on Inpatient Glycemic Control (2015)  Target Ranges:  Prepandial:   less than 140 mg/dL      Peak postprandial:   less than 180 mg/dL (1-2 hours)      Critically ill patients:  140 - 180 mg/dL   Lab Results  Component Value Date   GLUCAP 212 (H) 05/19/2022   HGBA1C 7.4 (A) 04/28/2022    Review of Glycemic Control  Latest Reference Range & Units 05/18/22 18:54 05/18/22 22:08 05/19/22 08:04  Glucose-Capillary 70 - 99 mg/dL 778 (H) 242 (H) 353 (H)  (H): Data is abnormally high DM history: T1DM Outpatient DM medications: Lantus 25 units QAM, Lantus 20 units QHS, Novolog 15 units with meals (plus post prandial correction) Prior to Pregnancy: Humulin 70/30- 12 units BID Current orders for Inpatient glycemic control: Semglee 5 units QD, Novolog 0-15 units TID & HS   Inpatient Diabetes Program Recommendations:    Consider Semglee 5 units BID now that patient improving.  As intake improves would consider 70/30 8 units BID for outpatient planning.    Thanks, Lujean Rave, MSN, RNC-OB Diabetes Coordinator [phone] (8a-5p)

## 2022-05-21 LAB — CULTURE, BLOOD (ROUTINE X 2)
Culture: NO GROWTH
Special Requests: ADEQUATE

## 2022-05-22 ENCOUNTER — Inpatient Hospital Stay (HOSPITAL_COMMUNITY): Payer: Medicaid Other

## 2022-05-22 ENCOUNTER — Other Ambulatory Visit: Payer: Medicaid Other

## 2022-05-22 ENCOUNTER — Ambulatory Visit: Payer: Medicaid Other | Admitting: Internal Medicine

## 2022-05-22 ENCOUNTER — Inpatient Hospital Stay (HOSPITAL_COMMUNITY)
Admission: AD | Admit: 2022-05-22 | Payer: Medicaid Other | Source: Home / Self Care | Admitting: Obstetrics & Gynecology

## 2022-05-23 ENCOUNTER — Ambulatory Visit: Payer: Self-pay

## 2022-05-23 NOTE — Lactation Note (Signed)
This note was copied from a baby's chart.  NICU Lactation Consultation Note  Patient Name: Kristin Clay Date: 05/23/2022 Age:37 days   Subjective Reason for consult: Initial assessment; RN request; Mother's request; NICU baby; Preterm <34wks  RN called lactation to room to assist Kristin Clay with setting up a DEBP. [employer] Ped #[id number].  Kristin Clay has not seen lactation to date with her current baby "Kristin Clay." She states that she did pump with her prior child who was also born preterm in August 2022.  I set up a DEBP and showed her how to use it. I also showed her how to use the manual pump for home use. She was able to express 5 mls. I recommended that she pump both breasts q3 hours.  I reviewed how to clean her pump parts. I provided labels and storage containers.  Kristin Clay is interested in receiving a personal pump. Her plan may cover a Stork pump. This LC will investigate the options for a Stork pump.  Objective Infant data: Mother's Current Feeding Choice: Breast Milk and Formula  Infant feeding assessment Scale for Readiness: 3  Maternal data: [id number]  C-Section, Low Transverse  Current breast feeding challenges:: NICU; preterm  Previous breastfeeding challenges?: Infant separation; Low milk supply; Lack of support; Other (Comment) (NICU; preterm)  Does the patient have breastfeeding experience prior to this delivery?: Yes  Pumping frequency: recommended q3 hours Pumped volume: 5 mL Flange Size: 24   Pump:  (will place a stork pump referral)  Intervention/Plan Interventions: Breast feeding basics reviewed; DEBP; Education  Tools: Pump; Flanges Pump Education: Setup, frequency, and cleaning; Milk Storage  Plan: Consult Status: NICU follow-up  NICU Follow-up type: New admission follow up; Verify onset of copious milk; Verify absence of engorgement; Weekly NICU follow up    Walker Shadow 05/23/2022, 3:28 PM

## 2022-05-24 ENCOUNTER — Ambulatory Visit: Payer: Self-pay

## 2022-05-24 NOTE — Lactation Note (Signed)
This note was copied from a baby's chart. Lactation Consultation Note  Patient Name: Kristin Clay OLIDC'V Date: 05/24/2022   Age:37 days Stork pump delivered to Ms. Casalino's room today.   Walker Shadow 05/24/2022, 3:41 PM

## 2022-05-25 ENCOUNTER — Encounter (INDEPENDENT_AMBULATORY_CARE_PROVIDER_SITE_OTHER): Payer: Self-pay | Admitting: Ophthalmology

## 2022-05-25 ENCOUNTER — Telehealth (HOSPITAL_COMMUNITY): Payer: Self-pay

## 2022-05-25 ENCOUNTER — Ambulatory Visit (INDEPENDENT_AMBULATORY_CARE_PROVIDER_SITE_OTHER): Payer: Medicaid Other | Admitting: Ophthalmology

## 2022-05-25 ENCOUNTER — Ambulatory Visit: Payer: Medicaid Other

## 2022-05-25 DIAGNOSIS — E103513 Type 1 diabetes mellitus with proliferative diabetic retinopathy with macular edema, bilateral: Secondary | ICD-10-CM

## 2022-05-25 DIAGNOSIS — H35033 Hypertensive retinopathy, bilateral: Secondary | ICD-10-CM

## 2022-05-25 DIAGNOSIS — H35373 Puckering of macula, bilateral: Secondary | ICD-10-CM

## 2022-05-25 DIAGNOSIS — O0992 Supervision of high risk pregnancy, unspecified, second trimester: Secondary | ICD-10-CM

## 2022-05-25 DIAGNOSIS — I10 Essential (primary) hypertension: Secondary | ICD-10-CM

## 2022-05-25 NOTE — Telephone Encounter (Signed)
Patient did not answer phone call. Voicemail left for patient.   Kristin Clay [facility] 05/25/2022,1745

## 2022-05-25 NOTE — Progress Notes (Signed)
[facility] - Clinic Note  05/25/2022     CHIEF COMPLAINT Patient presents for Retina Follow Up   HISTORY OF PRESENT ILLNESS: Kristin Clay WELMA CRILE is a 36 y.o. female who presents to the clinic today for:   HPI     Retina Follow Up   Patient presents with  Diabetic Retinopathy.  In both eyes.  This started 4 weeks ago.  I, the attending physician,  performed the HPI with the patient and updated documentation appropriately.        Comments   Patient here for 4 weeks retina follow up for PDR OU. Patient states vision doing good. No eye pain. Had a baby boy since last here.       Last edited by Rennis Chris, MD on 05/27/2022 11:16 PM.      Referring physician: Aura Camps MD [address] [place], [place] [address]  HISTORICAL INFORMATION:   Selected notes from the MEDICAL RECORD NUMBER Referred by Dr. Karleen Hampshire for diabetic retinal evaluation LEE:  Ocular Hx- PMH-pregnancy, DM    CURRENT MEDICATIONS: No current outpatient medications on file. (Ophthalmic Drugs)   No current facility-administered medications for this visit. (Ophthalmic Drugs)   Current Outpatient Medications (Other)  Medication Sig   Accu-Chek Softclix Lancets lancets Use as instructed; check blood glucose 4 times daily   acetaminophen (TYLENOL) 500 MG tablet Take 1,000 mg by mouth every 6 (six) hours as needed.   aspirin (ASPIRIN LOW DOSE) 81 MG chewable tablet CHEW 1 TABLET BY MOUTH DAILY.   Blood Glucose Monitoring Suppl (ACCU-CHEK GUIDE) w/Device KIT use as directed 4 (four) times daily.   furosemide (LASIX) 20 MG tablet Take 1 tablet (20 mg total) by mouth daily.   glucose blood (ACCU-CHEK GUIDE) test strip Use as instructed; check blood glucose 4 times daily   insulin aspart (NOVOLOG FLEXPEN) 100 UNIT/ML FlexPen Inject 3 units subcutaneously with meals.   insulin glargine (LANTUS) 100 UNIT/ML Solostar Pen Inject 22 units subcutaneously nightly.   Insulin  Pen Needle 32G X 4 MM MISC 1 Device by Does not apply route in the morning, at noon, in the evening, and at bedtime.   Insulin Pen Needle 32G X 4 MM MISC Use as directed   lisinopril (ZESTRIL) 10 MG tablet Take 1 tablet (10 mg total) by mouth daily.   NIFEdipine (ADALAT CC) 60 MG 24 hr tablet Take 1 tablet (60 mg total) by mouth 2 (two) times daily.   oxyCODONE-acetaminophen (PERCOCET/ROXICET) 5-325 MG tablet Take 1-2 tablets by mouth every 6 (six) hours as needed.   Prenatal Vit-Fe Fumarate-FA (PRENATAL VITAMIN) 27-0.8 MG TABS Take 1 tablet by mouth daily.   Continuous Blood Gluc Sensor (DEXCOM G6 SENSOR) MISC 1 Device by Does not apply route as directed. Every 10 days (Patient not taking: Reported on 05/04/2022)   Continuous Blood Gluc Transmit (DEXCOM G6 TRANSMITTER) MISC 1 Device by Does not apply route every 3 (three) months. (Patient not taking: Reported on 05/04/2022)   Insulin Disposable Pump (OMNIPOD 5 G6 INTRO, GEN 5,) KIT 1 Device by Does not apply route every 3 (three) days. (Patient not taking: Reported on 05/04/2022)   Insulin Disposable Pump (OMNIPOD 5 G6 POD, GEN 5,) MISC 1 Device by Does not apply route every 3 (three) days. (Patient not taking: Reported on 05/04/2022)   No current facility-administered medications for this visit. (Other)   REVIEW OF SYSTEMS: ROS   Positive for: Endocrine, Eyes Negative for: Constitutional, Gastrointestinal,  Neurological, Skin, Genitourinary, Musculoskeletal, HENT, Cardiovascular, Respiratory, Psychiatric, Allergic/Imm, Heme/Lymph Last edited by Laddie Aquas, COA on 05/25/2022 10:08 AM.     ALLERGIES Allergies  Allergen Reactions   Pork-Derived Products    PAST MEDICAL HISTORY Past Medical History:  Diagnosis Date   Chronic hypertension    Complication of anesthesia    with hand surgery, local anes did not work, tried twice- had to put her to sleep   Diabetic retinopathy (HCC)    DKA (diabetic ketoacidosis) (HCC) 10/2020   Epidural  hematoma (HCC) 07/04/2021   Obstetric vaginal laceration with type 3c third degree perineal laceration 06/29/2021   Pyelonephritis 10/2020   Rh negative status during pregnancy 05/01/2021   s/p Rhogam 05/13/21   Type I diabetes mellitus (HCC)    dx 47yrs ago   UTI (urinary tract infection)    Past Surgical History:  Procedure Laterality Date   CESAREAN SECTION  05/16/2022   Procedure: CESAREAN SECTION;  Surgeon: Warden Fillers, MD;  Location: [facility] LD [facility]S;  Service: Obstetrics;;   female circumcision Bilateral    clitorectomy as well   FOOT SURGERY  2018   HAND SURGERY  2019   FAMILY HISTORY Family History  Problem Relation Age of Onset   Hyperlipidemia Mother    Hypertension Mother    Kidney disease Father    Alzheimer's disease Father    SOCIAL HISTORY Social History   Tobacco Use   Smoking status: Never   Smokeless tobacco: Never  Vaping Use   Vaping Use: Never used  Substance Use Topics   Alcohol use: Never   Drug use: Never       OPHTHALMIC EXAM:  Base Eye Exam     Visual Acuity (Snellen - Linear)       Right Left   Dist [place] 20/30 20/25   Dist ph [place] 20/30 +2 NI         Tonometry (Tonopen, 10:05 AM)       Right Left   Pressure 15 14         Pupils       Dark Light Shape React APD   Right 3 2 Round Brisk None   Left 3 2 Round Brisk None         Visual Fields (Counting fingers)       Left Right   Restrictions Partial outer inferior temporal deficiency Partial outer superior temporal, superior nasal deficiencies         Extraocular Movement       Right Left    Full, Ortho Full, Ortho         Neuro/Psych     Oriented x3: Yes   Mood/Affect: Normal         Dilation     Both eyes: 1.0% Mydriacyl, 2.5% Phenylephrine @ 10:05 AM           Slit Lamp and Fundus Exam     Slit Lamp Exam       Right Left   Lids/Lashes normal normal   Conjunctiva/Sclera mild melanosis meld milanosis   Cornea Clear Clear   Anterior Chamber  deep and quiet deep and quiet   Iris round and dilated, no NVI round and dilated, no NVI   Lens Clear Clear   Anterior Vitreous syneresis syneresis         Fundus Exam       Right Left   Disc pink and sharp; compact; mild hyperemia; +fibrosis, fine NVD -- regressing; +PPP  pink and sharp; compact; +fibrosis w/ early fine NVD - regressing   C/D Ratio 0.2 0.2   Macula good foveal reflex, scattered MA/DBH, exudates, +CWS, fine NVE inferior and temporal macula - persistent - ?regressing, ERM with stria nasally good foveal reflex, ERM with stria, scattered MA/DBH, exudates temporally, scattered fibrosis and NV   Vessels attenuated, Tortuous, +NVE greatest inferior arcades, Copper wiring attenuated, copper wiring, +NVE -- scattered   Periphery attached, 360 MA/DBH, good 360 PRP laser changes and posterior fill in, scattered NV attached, 360 MA/DBH, good 360 PRP with room for fill in, scattered NV           IMAGING AND PROCEDURES  Imaging and Procedures for 05/25/2022  OCT, Retina - OU - Both Eyes       Right Eye Quality was good. Central Foveal Thickness: 275. Progression has been stable. Findings include normal foveal contour, no SRF, intraretinal hyper-reflective material, intraretinal fluid, vitreomacular adhesion , preretinal fibrosis (Mild persistent IRF / IRHM, +PRF overlying disc).   Left Eye Quality was good. Central Foveal Thickness: 299. Progression has been stable. Findings include no SRF, abnormal foveal contour, epiretinal membrane, intraretinal fluid, macular pucker, vitreous traction, preretinal fibrosis (Mild interval improvement in cystic changes temporal macula, pre retinal fibrosis overlying disc extending to macula w/ mild traction).   Notes *Images captured and stored on drive  Diagnosis / Impression:  PDR w/ DME OU OD: mild persistent IRF / IRHM, +PRF overlying disc OS: Mild interval improvement in cystic changes temporal macula, pre retinal fibrosis overlying  disc extending to macula w/ mild traction  Clinical management:  See below  Abbreviations: NFP - Normal foveal profile. CME - cystoid macular edema. PED - pigment epithelial detachment. IRF - intraretinal fluid. SRF - subretinal fluid. EZ - ellipsoid zone. ERM - epiretinal membrane. ORA - outer retinal atrophy. ORT - outer retinal tubulation. SRHM - subretinal hyper-reflective material. IRHM - intraretinal hyper-reflective material            ASSESSMENT/PLAN:    ICD-10-CM   1. 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 Epiretinal membrane (ERM) of both eyes  H35.373     3. Essential hypertension  I10     4. Hypertensive retinopathy of both eyes  H35.033      1,2. 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, both eyes  - s/p PRP OD (03.27.23), fill in (05.15.23)  - s/p PRP OS (04.17.23) - last A1c 8.4 on 04.06.23; 10.4 on 01.23.23 - BCVA OD: 20/30 OS: 20/25 - exam shows early +NVD improving and persistent NVE OU - now w/ good PRP laser OU w/ good fill in OD - OCT OD: interval improvement in IRF / IRHM, +PRF overlying disc; OS: Mild interval improvement in cystic changes temporal macula, pre retinal fibrosis overlying disc extending to macula w/ mild traction - no treatment recommended today - f/u 2 months -- DFE/OCT, possible FA  3,4. History of high risk pregnancy w/ +HTN and hypertensive retinopathy  - s/p Cesarean 06.24.23 (baby boy) - 33 weeks and 1 day, 3lbs, currently in NICU - history of pre-eclampsia - BP improved since delivery - discussed importance of tight BP control  - monitor   Ophthalmic Meds Ordered this visit:  No orders of the defined types were placed in this encounter.    Return in about 2 months (around 07/26/2022) for DFE, OCT.  There are no Patient Instructions on file for this visit.  Explained the diagnoses, plan, and follow  up with the patient and they expressed understanding.  Patient expressed understanding of the importance of proper follow up care.   This document serves as a record of services personally performed by Karie Chimera, MD, PhD. It was created on their behalf by Joni Reining, an ophthalmic technician. The creation of this record is the provider's dictation and/or activities during the visit.    Electronically signed by: Joni Reining COA, 05/27/22  11:23 PM   Karie Chimera, M.D., Ph.D. Diseases & Surgery of the Retina and Vitreous [facility]  I have reviewed the above documentation for accuracy and completeness, and I agree with the above. Karie Chimera, M.D., Ph.D. 05/27/22 11:23 PM    Abbreviations: M myopia (nearsighted); A astigmatism; H hyperopia (farsighted); P presbyopia; Mrx spectacle prescription;  CTL contact lenses; OD right eye; OS left eye; OU both eyes  XT exotropia; ET esotropia; PEK punctate epithelial keratitis; PEE punctate epithelial erosions; DES dry eye syndrome; MGD meibomian gland dysfunction; ATs artificial tears; PFAT's preservative free artificial tears; NSC nuclear sclerotic cataract; PSC posterior subcapsular cataract; ERM epi-retinal membrane; PVD posterior vitreous detachment; RD retinal detachment; DM diabetes mellitus; DR diabetic retinopathy; NPDR non-proliferative diabetic retinopathy; PDR proliferative diabetic retinopathy; CSME clinically significant macular edema; DME diabetic macular edema; dbh dot blot hemorrhages; CWS cotton wool spot; POAG primary open angle glaucoma; C/D cup-to-disc ratio; HVF humphrey visual field; GVF goldmann visual field; OCT optical coherence tomography; IOP intraocular pressure; BRVO Branch retinal vein occlusion; CRVO central retinal vein occlusion; CRAO central retinal artery occlusion; BRAO branch retinal artery occlusion; RT retinal tear; SB scleral buckle; PPV pars plana vitrectomy; VH Vitreous hemorrhage;  PRP panretinal laser photocoagulation; IVK intravitreal kenalog; VMT vitreomacular traction; MH Macular hole;  NVD neovascularization of the disc; NVE neovascularization elsewhere; AREDS age related eye disease study; ARMD age related macular degeneration; POAG primary open angle glaucoma; EBMD epithelial/anterior basement membrane dystrophy; ACIOL anterior chamber intraocular lens; IOL intraocular lens; PCIOL posterior chamber intraocular lens; Phaco/IOL phacoemulsification with intraocular lens placement; PRK photorefractive keratectomy; LASIK laser assisted in situ keratomileusis; HTN hypertension; DM diabetes mellitus; COPD chronic obstructive pulmonary disease

## 2022-05-27 ENCOUNTER — Encounter (INDEPENDENT_AMBULATORY_CARE_PROVIDER_SITE_OTHER): Payer: Self-pay | Admitting: Ophthalmology

## 2022-05-29 ENCOUNTER — Other Ambulatory Visit: Payer: Medicaid Other

## 2022-06-03 ENCOUNTER — Ambulatory Visit: Payer: Self-pay

## 2022-06-03 ENCOUNTER — Ambulatory Visit: Payer: Medicaid Other

## 2022-06-03 ENCOUNTER — Telehealth: Payer: Self-pay

## 2022-06-03 NOTE — Lactation Note (Signed)
This note was copied from a baby's chart. Lactation Consultation Note  Patient Name: Kristin Clay [id number] Date: 06/03/2022   Age:37 wk.o.  This LC has tried to visit with mom the last 3 days but she hasn't come to the unit. NICU RN Kristin Clay reported that FOB came for a brief visit yesterday around lunch time and left 20 ml of EBM; however, the milk wasn't labeled. Called Kristin Clay to the number we have on file but only got voicemail. Left a message asking her to reach out to [place] to F/U with pumping and also let us know what was the exact time/day she pumped the breastmilk her husband brought to the hospital yesterday; let her know that the milk bank won't take any breastmilk that has not been labeled. Noticed that baby has been mainly formula feeding, he's on Similac 24 calorie formula. Lactation to F/U with Kristin Clay the next time she comes in the unit; NICU RN aware of calling lactation when she arrives.  Feeding Nipple Type: Dr. Levert Feinstein Preemie   Kristin Clay 06/03/2022, 12:33 PM

## 2022-06-03 NOTE — Telephone Encounter (Signed)
Pt did not keep appt today for PP incision and BP check. Called pt with [facility] interpreter ID [phone]. VM left stating I am calling to reschedule missed appt. Call back number given.

## 2022-06-16 ENCOUNTER — Telehealth (HOSPITAL_COMMUNITY): Payer: Self-pay | Admitting: Lactation Services

## 2022-06-16 NOTE — Telephone Encounter (Signed)
[facility] RN Debera Lat called inquiring about OP lactation f/u and to talk about Rx Reglan for low milk supply. LC sent message to [facility] RN [facility]

## 2022-06-17 ENCOUNTER — Telehealth: Payer: Self-pay

## 2022-06-17 NOTE — Telephone Encounter (Signed)
Per Call from [facility]  ----- Message -----  From: Judee Clara, RN  Sent: 06/16/2022   6:11 PM EDT  To: Ed Blalock, RN; [facility] Admin Pool  Subject: Outpatient Lactation appt                       Debera Lat from [facility] called after seeing this dyad today.    Baby is 25 month old, NICU DC on 6/27  no follow-up scheduled  33 wks, <5 lbs   Pt late to start pumping and milk supply is low. Pt is consistently pumping and Debera Lat was going to call OB to inquire about Reglan Rx as has been on it with other baby.   Pt has been on Reglan in the past but it was prescribed during pregnancy for nausea and vomiting.  Mom informed hospital RN and LC less than 24 hours after delivery that she planned to formula feed. One week later Pt requested a breast pump and began expressing small amounts of milk. Per [facility] RN, pt is pumping every 3 hours. She currently expresses milliliters. Pt expressed to [facility] RN that pt friends and family all breastfeed. Suspect pt is feeling that she needs to provide breastmilk related to her personal relationships. Pt has an extensive medical history. LC at [facility] office to speak with provider regarding request.

## 2022-06-17 NOTE — Telephone Encounter (Signed)
VM left by [facility] RN to inquire if patient may take Reglan for milk supply. Per chart review, Britt Boozer has also spoken with pediatric nurse who is consulting with Jasmine December, RN, IBCLC.

## 2022-06-18 ENCOUNTER — Other Ambulatory Visit: Payer: Self-pay | Admitting: Obstetrics & Gynecology

## 2022-06-18 ENCOUNTER — Telehealth: Payer: Self-pay | Admitting: Lactation Services

## 2022-06-18 DIAGNOSIS — O0990 Supervision of high risk pregnancy, unspecified, unspecified trimester: Secondary | ICD-10-CM

## 2022-06-18 MED ORDER — METOCLOPRAMIDE HCL 10 MG PO TABS: 10.0000 mg | ORAL_TABLET | Freq: Three times a day (TID) | ORAL | 0 refills | Status: DC

## 2022-06-18 NOTE — Telephone Encounter (Signed)
Spoke with Vonzella Nipple, PA and Dr. Alvester Morin who agreed to prescribe Reglan for milk production. Prescription sent to the Pharmacy.   Called patient with assistance of [employer] Interpreter, # [phone] on mobile number listed, she did not answer, LM for her to call the office at [phone] at her convenience in relation to medication.   Called patient on home number and spoke with patient. Informed her Reglan has been sent to her pharmacy. Encouraged mom to continue pumping every 3 hours as medication will not work if not breast emptying is happening. Patient voiced understanding.

## 2022-06-18 NOTE — Telephone Encounter (Signed)
-----   Message from [place], RN sent at 06/17/2022 10:11 AM EDT ----- Regarding: RE: Outpatient Lactation appt I just looked at the sibling chart because I had worked with this mother in the past. She has uncontrolled type 1 diabetes, poor appetite, very thin and was not pumping. She never mentioned Reglan to me with the last child. I think there is more going on her than what Reglan can fix. I am not sure how motivated she is. Let me know if you have any ideas. She had stopped trying to make milk very early with baby #1 and did not pump in the hospital.  ----- Message ----- From: Ed Blalock, RN Sent: 06/17/2022  10:00 AM EDT To: Soyla Dryer, RN Subject: FW: Outpatient Lactation appt                  Maryann,   Do you want to reach out to her or would you like me to?   Jasmine December ----- Message ----- From: Judee Clara, RN Sent: 06/16/2022   6:11 PM EDT To: Ed Blalock, RN; [facility] Admin Pool Subject: Outpatient Lactation appt                      Debera Lat from [facility] called after seeing this dyad today.   Baby is 62 month old, NICU DC on 6/27  no follow-up scheduled 33 wks, <5 lbs  Mom late to start pumping and milk supply is low. Mom is consistently pumping and Debera Lat was going to call OB to inquire about Reglan Rx as Mom has been on it with other baby.

## 2022-06-29 ENCOUNTER — Ambulatory Visit: Payer: Medicaid Other | Admitting: Student

## 2022-07-16 NOTE — Progress Notes (Signed)
[facility] Note  07/28/2022     CHIEF COMPLAINT Patient presents for Retina Follow Up   HISTORY OF PRESENT ILLNESS: Kristin Clay is a 36 y.o. female who presents to the clinic today for:   HPI     Retina Follow Up   Patient presents with  Diabetic Retinopathy.  In both eyes.  Severity is moderate.  Duration of 2 months.  Since onset it is stable.  I, the attending physician,  performed the HPI with the patient and updated documentation appropriately.        Comments   Pt here for 2 mo ret f/u PDR OU. Pt states VA is the same, stable. No changes noted.       Last edited by Kingsley Spittle, COT on 07/28/2022 10:17 AM.    Pt states vision is doing well, no fol or floaters, her son is out of the NICU and at home now   Referring physician: Gevena Cotton MD [address], [place], [place] [address]  HISTORICAL INFORMATION:   Selected notes from the MEDICAL RECORD NUMBER Referred by Dr. Frederico Hamman for diabetic retinal evaluation LEE:  Ocular Hx- PMH-pregnancy, DM    CURRENT MEDICATIONS: No current outpatient medications on file. (Ophthalmic Drugs)   No current facility-administered medications for this visit. (Ophthalmic Drugs)   Current Outpatient Medications (Other)  Medication Sig   Accu-Chek Softclix Lancets lancets Use as instructed; check blood glucose 4 times daily   acetaminophen (TYLENOL) 500 MG tablet Take 1,000 mg by mouth every 6 (six) hours as needed.   aspirin (ASPIRIN LOW DOSE) 81 MG chewable tablet CHEW 1 TABLET BY MOUTH DAILY.   furosemide (LASIX) 20 MG tablet Take 1 tablet (20 mg total) by mouth daily.   glucose blood (ACCU-CHEK GUIDE) test strip Use as instructed; check blood glucose 4 times daily   insulin aspart (NOVOLOG FLEXPEN) 100 UNIT/ML FlexPen Inject 3 units subcutaneously with meals.   insulin glargine (LANTUS) 100 UNIT/ML Solostar Pen Inject 22 units subcutaneously nightly.   Insulin  Pen Needle 32G X 4 MM MISC 1 Device by Does not apply route in the morning, at noon, in the evening, and at bedtime.   Insulin Pen Needle 32G X 4 MM MISC Use as directed   labetalol (NORMODYNE) 300 MG tablet TAKE 1 TABLET BY MOUTH 2 TIMES DAILY.   lisinopril (ZESTRIL) 10 MG tablet Take 1 tablet (10 mg total) by mouth daily.   metoCLOPramide (REGLAN) 10 MG tablet Take 1 tablet (10 mg total) by mouth 3 (three) times daily before meals. Take 3 times a day for 30 days,Then take 2 a day for 4 days, Then take 1 a day for 4 days, Then stop   NIFEdipine (ADALAT CC) 60 MG 24 hr tablet Take 1 tablet (60 mg total) by mouth 2 (two) times daily.   oxyCODONE-acetaminophen (PERCOCET/ROXICET) 5-325 MG tablet Take 1-2 tablets by mouth every 6 (six) hours as needed.   Prenatal Vit-Fe Fumarate-FA (PRENATAL VITAMIN) 27-0.8 MG TABS Take 1 tablet by mouth daily.   Blood Glucose Monitoring Suppl (ACCU-CHEK GUIDE) w/Device KIT use as directed 4 (four) times daily. (Patient not taking: Reported on 07/28/2022)   Continuous Blood Gluc Sensor (DEXCOM G6 SENSOR) MISC 1 Device by Does not apply route as directed. Every 10 days (Patient not taking: Reported on 05/04/2022)   Continuous Blood Gluc Transmit (DEXCOM G6 TRANSMITTER) MISC 1 Device by Does not apply route every 3 (three)  months. (Patient not taking: Reported on 05/04/2022)   Insulin Disposable Pump (OMNIPOD 5 G6 INTRO, GEN 5,) KIT 1 Device by Does not apply route every 3 (three) days. (Patient not taking: Reported on 05/04/2022)   Insulin Disposable Pump (OMNIPOD 5 G6 POD, GEN 5,) MISC 1 Device by Does not apply route every 3 (three) days. (Patient not taking: Reported on 05/04/2022)   No current facility-administered medications for this visit. (Other)   REVIEW OF SYSTEMS: ROS   Positive for: Endocrine, Eyes Negative for: Constitutional, Gastrointestinal, Neurological, Skin, Genitourinary, Musculoskeletal, HENT, Cardiovascular, Respiratory, Psychiatric, Allergic/Imm,  Heme/Lymph Last edited by Kingsley Spittle, COT on 07/28/2022 10:17 AM.      ALLERGIES Allergies  Allergen Reactions   Pork-Derived Products    PAST MEDICAL HISTORY Past Medical History:  Diagnosis Date   Chronic hypertension    Complication of anesthesia    with hand surgery, local anes did not work, tried twice- had to put her to sleep   Diabetic retinopathy ([place])    DKA (diabetic ketoacidosis) ([place]) 10/2020   Epidural hematoma ([place]) 07/04/2021   Obstetric vaginal laceration with type 3c third degree perineal laceration 06/29/2021   Pyelonephritis 10/2020   Rh negative status during pregnancy 05/01/2021   s/p Rhogam 05/13/21   Type I diabetes mellitus ([place])    dx 14yr ago   UTI (urinary tract infection)    Past Surgical History:  Procedure Laterality Date   CESAREAN SECTION  05/16/2022   Procedure: CESAREAN SECTION;  Surgeon: BGriffin Basil MD;  Location: [facility] LD [facility]S;  Service: Obstetrics;;   female circumcision Bilateral    clitorectomy as well   FOOT SURGERY  2018   HAND SURGERY  2019   FAMILY HISTORY Family History  Problem Relation Age of Onset   Hyperlipidemia Mother    Hypertension Mother    Kidney disease Father    Alzheimer's disease Father    SOCIAL HISTORY Social History   Tobacco Use   Smoking status: Never   Smokeless tobacco: Never  Vaping Use   Vaping Use: Never used  Substance Use Topics   Alcohol use: Never   Drug use: Never       OPHTHALMIC EXAM:  Base Eye Exam     Visual Acuity (Snellen - Linear)       Right Left   Dist [place] 20/25 -2 20/25 +2   Dist ph  20/25          Tonometry (Tonopen, 10:22 AM)       Right Left   Pressure 15 12         Pupils       Dark Light Shape React APD   Right 3 2 Round Brisk None   Left 3 2 Round Brisk None         Visual Fields (Counting fingers)       Left Right    Full Full         Extraocular Movement       Right Left    Full, Ortho Full, Ortho          Neuro/Psych     Oriented x3: Yes   Mood/Affect: Normal         Dilation     Both eyes: 1.0% Mydriacyl, 2.5% Phenylephrine @ 10:24 AM           Slit Lamp and Fundus Exam     Slit Lamp Exam       Right Left  Lids/Lashes normal normal   Conjunctiva/Sclera mild melanosis, Trace Injection, +corkscrew vessels nasally mild melanosis   Cornea trace PEE trace PEE   Anterior Chamber deep, clear, narrow temporal angle deep and quiet   Iris round and dilated, no NVI round and dilated, no NVI   Lens Clear Clear   Anterior Vitreous syneresis syneresis         Fundus Exam       Right Left   Disc pink and sharp; compact; mild hyperemia; +fibrosis, fine NVD -- regressing; +PPP pink and sharp; compact; +fibrosis w/ early fine NVD - regressing   C/D Ratio 0.2 0.2   Macula good foveal reflex, scattered MA/DBH, exudates, +CWS, fine NVE inferior and temporal macula - persistent - ?regressing, ERM with striae nasally good foveal reflex, ERM with striae, scattered MA/DBH greatest temporal macula, exudates temporally, scattered fibrosis and NV   Vessels attenuated, Tortuous, early NVE greatest inferior arcades -- ?regressing, Copper wiring attenuated, copper wiring, +NVE -- scattered, +fibrosis   Periphery attached, 360 MA/DBH, good 360 PRP laser changes and posterior fill in, scattered NV attached, 360 MA/DBH, good 360 PRP with room for fill in, scattered NV           IMAGING AND PROCEDURES  Imaging and Procedures for 07/28/2022  OCT, Retina - OU - Both Eyes       Right Eye Quality was good. Central Foveal Thickness: 257. Progression has been stable. Findings include normal foveal contour, no SRF, intraretinal hyper-reflective material, intraretinal fluid, vitreomacular adhesion , preretinal fibrosis (Mild persistent IRF / IRHM, +PRF overlying disc and inferior macula).   Left Eye Quality was good. Central Foveal Thickness: 276. Progression has been stable. Findings include no IRF, no  SRF, abnormal foveal contour, epiretinal membrane, macular pucker, vitreous traction, preretinal fibrosis (Mild interval improvement in cystic changes temporal macula, pre retinal fibrosis overlying disc extending to macula w/ mild traction).   Notes *Images captured and stored on drive  Diagnosis / Impression:  PDR w/ DME OU OD: mild persistent IRF / IRHM, +PRF overlying disc and inferior macula OS: Mild interval improvement in cystic changes temporal macula, pre retinal fibrosis overlying disc extending to macula w/ mild traction  Clinical management:  See below  Abbreviations: NFP - Normal foveal profile. CME - cystoid macular edema. PED - pigment epithelial detachment. IRF - intraretinal fluid. SRF - subretinal fluid. EZ - ellipsoid zone. ERM - epiretinal membrane. ORA - outer retinal atrophy. ORT - outer retinal tubulation. SRHM - subretinal hyper-reflective material. IRHM - intraretinal hyper-reflective material            ASSESSMENT/PLAN:    ICD-10-CM   1. 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 Epiretinal membrane (ERM) of both eyes  H35.373     3. Essential hypertension  I10     4. Hypertensive retinopathy of both eyes  H35.033     5. High-risk pregnancy in second trimester  O09.92      1,2. 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, both eyes  - s/p PRP OD (03.27.23), fill in (05.15.23)  - s/p PRP OS (04.17.23) - last A1c 8.4 on 04.06.23; 10.4 on 01.23.23 - BCVA 20/25 OU - exam shows early +NVD improving and persistent NVE OU - now w/ good PRP laser OU w/ good fill in OD - OCT OD: interval improvement in IRF / IRHM, +PRF overlying  disc and inferior macula; OS: Mild interval improvement in cystic changes temporal macula, pre retinal fibrosis overlying disc extending to macula w/ mild traction - no treatment recommended today - f/u 2-4 weeks  -- DFE/OCT, FA  3-5. History of high risk pregnancy w/ +HTN and hypertensive retinopathy  - s/p Cesarean 06.24.23 (baby boy) - 33 weeks and 1 day, 3lbs, now out of the NICU and at home - history of pre-eclampsia - BP improved since delivery - discussed importance of tight BP control  - monitor   Ophthalmic Meds Ordered this visit:  No orders of the defined types were placed in this encounter.    Return for f/u 2-4 weeks, PDR OU, DFE, OCT, FA.  There are no Patient Instructions on file for this visit.  Explained the diagnoses, plan, and follow up with the patient and they expressed understanding.  Patient expressed understanding of the importance of proper follow up care.   This document serves as a record of services personally performed by Gardiner Sleeper, MD, PhD. It was created on their behalf by Renaldo Reel, [place] an ophthalmic technician. The creation of this record is the provider's dictation and/or activities during the visit.    Electronically signed by:  Renaldo Reel, COT  07/16/22 12:27 PM   This document serves as a record of services personally performed by Gardiner Sleeper, MD, PhD. It was created on their behalf by San Jetty. Owens Shark, OA an ophthalmic technician. The creation of this record is the provider's dictation and/or activities during the visit.    Electronically signed by: San Jetty. Owens Shark, OA '@TODAY' @ 12:27 PM  Gardiner Sleeper, M.D., Ph.D. Diseases & Surgery of the Retina and Vitreous [facility]  I have reviewed the above documentation for accuracy and completeness, and I agree with the above. Gardiner Sleeper, M.D., Ph.D. 07/28/22 12:27 PM  Abbreviations: M myopia (nearsighted); A astigmatism; H hyperopia (farsighted); P presbyopia; Mrx spectacle prescription;  CTL contact lenses; OD right eye; OS left eye; OU both eyes  XT exotropia; ET esotropia; PEK punctate epithelial keratitis; PEE punctate epithelial erosions; DES dry eye  syndrome; MGD meibomian gland dysfunction; ATs artificial tears; PFAT's preservative free artificial tears; [place] nuclear sclerotic cataract; PSC posterior subcapsular cataract; ERM epi-retinal membrane; PVD posterior vitreous detachment; RD retinal detachment; DM diabetes mellitus; DR diabetic retinopathy; NPDR non-proliferative diabetic retinopathy; PDR proliferative diabetic retinopathy; CSME clinically significant macular edema; DME diabetic macular edema; dbh dot blot hemorrhages; CWS cotton wool spot; POAG primary open angle glaucoma; C/D cup-to-disc ratio; HVF humphrey visual field; GVF goldmann visual field; OCT optical coherence tomography; IOP intraocular pressure; BRVO Branch retinal vein occlusion; CRVO central retinal vein occlusion; CRAO central retinal artery occlusion; BRAO branch retinal artery occlusion; RT retinal tear; SB scleral buckle; PPV pars plana vitrectomy; VH Vitreous hemorrhage; PRP panretinal laser photocoagulation; IVK intravitreal kenalog; VMT vitreomacular traction; MH Macular hole;  NVD neovascularization of the disc; NVE neovascularization elsewhere; AREDS age related eye disease study; ARMD age related macular degeneration; POAG primary open angle glaucoma; EBMD epithelial/anterior basement membrane dystrophy; ACIOL anterior chamber intraocular lens; IOL intraocular lens; PCIOL posterior chamber intraocular lens; Phaco/IOL phacoemulsification with intraocular lens placement; [place] photorefractive keratectomy; LASIK laser assisted in situ keratomileusis; HTN hypertension; DM diabetes mellitus; COPD chronic obstructive pulmonary disease

## 2022-07-18 ENCOUNTER — Other Ambulatory Visit: Payer: Self-pay | Admitting: Family Medicine

## 2022-07-21 ENCOUNTER — Encounter: Payer: Self-pay | Admitting: Medical

## 2022-07-21 ENCOUNTER — Ambulatory Visit (INDEPENDENT_AMBULATORY_CARE_PROVIDER_SITE_OTHER): Payer: Medicaid Other | Admitting: Medical

## 2022-07-21 DIAGNOSIS — E1042 Type 1 diabetes mellitus with diabetic polyneuropathy: Secondary | ICD-10-CM | POA: Diagnosis not present

## 2022-07-21 DIAGNOSIS — Z30017 Encounter for initial prescription of implantable subdermal contraceptive: Secondary | ICD-10-CM | POA: Diagnosis not present

## 2022-07-21 DIAGNOSIS — I10 Essential (primary) hypertension: Secondary | ICD-10-CM

## 2022-07-21 DIAGNOSIS — E1010 Type 1 diabetes mellitus with ketoacidosis without coma: Secondary | ICD-10-CM | POA: Diagnosis not present

## 2022-07-21 DIAGNOSIS — E1039 Type 1 diabetes mellitus with other diabetic ophthalmic complication: Secondary | ICD-10-CM | POA: Diagnosis not present

## 2022-07-21 LAB — POCT PREGNANCY, URINE: Preg Test, Ur: NEGATIVE

## 2022-07-21 MED ORDER — ETONOGESTREL 68 MG ~~LOC~~ IMPL
68.0000 mg | DRUG_IMPLANT | Freq: Once | SUBCUTANEOUS | Status: AC
  Administered 2022-07-21: 68 mg via SUBCUTANEOUS

## 2022-07-21 NOTE — Progress Notes (Signed)
Post Partum Visit Note  Kristin Clay is a 36 y.o. [phone] female who presents for a postpartum visit. She is 9 weeks postpartum following a primary cesarean section.  I have fully reviewed the prenatal and intrapartum course. The delivery was at 33.1 gestational weeks.  Anesthesia: epidural. Postpartum course has been uncomplicated. Baby is doing well. Baby is feeding by bottle - Similac Neosure. Bleeding no bleeding. Bowel function is normal. Bladder function is normal. Patient is sexually active. Contraception method is none. Last intercourse 1 week. LMP 07/13/22. Postpartum depression screening: negative.   Upstream - 07/21/22 0907       Pregnancy Intention Screening   Does the patient want to become pregnant in the next year? Yes    Does the patient's partner want to become pregnant in the next year? Yes    Would the patient like to discuss contraceptive options today? Yes      Contraception Wrap Up   Current Method No Contraceptive Precautions    End Method Hormonal Implant    Contraception Counseling Provided Yes            The pregnancy intention screening data noted above was reviewed. Potential methods of contraception were discussed. The patient elected to proceed with Hormonal Implant.   Edinburgh Postnatal Depression Scale - 07/21/22 0854       Edinburgh Postnatal Depression Scale:  In the Past 7 Days   I have been able to laugh and see the funny side of things. 0    I have looked forward with enjoyment to things. 0    I have blamed myself unnecessarily when things went wrong. 0    I have been anxious or worried for no good reason. 0    I have felt scared or panicky for no good reason. 0    Things have been getting on top of me. 0    I have been so unhappy that I have had difficulty sleeping. 0    I have felt sad or miserable. 0    I have been so unhappy that I have been crying. 0    The thought of harming myself has occurred to me. 0    Edinburgh  Postnatal Depression Scale Total 0             Health Maintenance Due  Topic Date Due   COVID-19 Vaccine (1) Never done   FOOT EXAM  Never done   OPHTHALMOLOGY EXAM  Never done   INFLUENZA VACCINE  06/23/2022    The following portions of the patient's history were reviewed and updated as appropriate: allergies, current medications, past family history, past medical history, past social history, past surgical history, and problem list.  Review of Systems Pertinent items are noted in HPI.  Objective:  BP 117/88   Pulse 80   Wt 105 lb (47.6 kg)   LMP 09/20/2021   Breastfeeding No   BMI 19.20 kg/m    General:  alert and cooperative   Breasts:  not indicated  Lungs: Normal effort  Heart:  Regular rate  Abdomen: Soft, non-tender    Wound N/A  GU exam:  not indicated         GYNECOLOGY CLINIC PROCEDURE NOTE  Nexplanon Insertion Procedure Patient was given informed consent, she signed consent form.  Patient does understand that irregular bleeding is a very common side effect of this medication. She was advised to have backup contraception for one week after placement. Pregnancy test  in clinic today was negative. Patient is < 7 days from LMP and no IC since LMP.  Appropriate time out taken.  Patient's left arm was prepped and draped in the usual sterile fashion.. The ruler used to measure and mark insertion area.  Patient was prepped with alcohol swab and then injected with 3 ml of 1% lidocaine.  She was prepped with betadine, Nexplanon removed from packaging,  Device confirmed in needle, then inserted full length of needle and withdrawn per handbook instructions. Nexplanon was able to palpated in the patient's arm; patient palpated the insert herself. There was minimal blood loss.  Patient insertion site covered with guaze and a pressure bandage to reduce any bruising.  The patient tolerated the procedure well and was given post procedure instructions.    Assessment:   1.  Routine postpartum follow-up  2. Type 1 diabetes mellitus with ketoacidosis, uncontrolled (HCC) - Follow-up with Endocrinology scheduled for October   3. Primary hypertension - Normotensive today on Lisinopril and Nifedipine   4. DM type 1 causing eye disease (HCC) - Retina follow-up next month   5. Type 1 diabetes mellitus with diabetic polyneuropathy (HCC)  6. Cesarean delivery delivered  Plan:   Essential components of care per ACOG recommendations:  1.  Mood and well being: Patient with negative depression screening today. Reviewed local resources for support.  - Patient tobacco use? No.   - hx of drug use? No.    2. Infant care and feeding:  -Patient currently breastmilk feeding? No.  -Social determinants of health (SDOH) reviewed in EPIC. No concerns  3. Sexuality, contraception and birth spacing - Patient does want a pregnancy in the next year.  Desired family size is 3 children.  - Reviewed reproductive life planning. Reviewed contraceptive methods based on pt preferences and effectiveness.  Patient desired Hormonal Implant today.   - Discussed birth spacing of 18 months  4. Sleep and fatigue -Encouraged family/partner/community support of 4 hrs of uninterrupted sleep to help with mood and fatigue  5. Physical Recovery  - Discussed patients delivery and complications.  - Patient had a C-section for arrest of decent and early sepsis  - Patient has urinary incontinence? No. - Patient is safe to resume physical and sexual activity  6.  Health Maintenance - HM due items addressed Yes - Last pap smear  Diagnosis  Date Value Ref Range Status  05/01/2021   Final   - Negative for intraepithelial lesion or malignancy (NILM)   Pap smear not done at today's visit.  -Breast Cancer screening indicated? No.   7. Chronic Disease/Pregnancy Condition follow up: Hypertension and DM - PCP and endocrine follow up - Cardio Obstetrics scheduled   Vonzella Nipple, PA-C [facility] [employer], [facility]

## 2022-07-28 ENCOUNTER — Encounter (INDEPENDENT_AMBULATORY_CARE_PROVIDER_SITE_OTHER): Payer: Self-pay | Admitting: Ophthalmology

## 2022-07-28 ENCOUNTER — Ambulatory Visit (INDEPENDENT_AMBULATORY_CARE_PROVIDER_SITE_OTHER): Payer: Medicaid Other | Admitting: Ophthalmology

## 2022-07-28 DIAGNOSIS — O0992 Supervision of high risk pregnancy, unspecified, second trimester: Secondary | ICD-10-CM

## 2022-07-28 DIAGNOSIS — H35033 Hypertensive retinopathy, bilateral: Secondary | ICD-10-CM | POA: Diagnosis not present

## 2022-07-28 DIAGNOSIS — I10 Essential (primary) hypertension: Secondary | ICD-10-CM

## 2022-07-28 DIAGNOSIS — E103513 Type 1 diabetes mellitus with proliferative diabetic retinopathy with macular edema, bilateral: Secondary | ICD-10-CM

## 2022-07-28 DIAGNOSIS — H35373 Puckering of macula, bilateral: Secondary | ICD-10-CM | POA: Diagnosis not present

## 2022-08-19 ENCOUNTER — Encounter (INDEPENDENT_AMBULATORY_CARE_PROVIDER_SITE_OTHER): Payer: Medicaid Other | Admitting: Ophthalmology

## 2022-08-19 DIAGNOSIS — H35373 Puckering of macula, bilateral: Secondary | ICD-10-CM

## 2022-08-19 DIAGNOSIS — O0992 Supervision of high risk pregnancy, unspecified, second trimester: Secondary | ICD-10-CM

## 2022-08-19 DIAGNOSIS — H35033 Hypertensive retinopathy, bilateral: Secondary | ICD-10-CM

## 2022-08-19 DIAGNOSIS — I10 Essential (primary) hypertension: Secondary | ICD-10-CM

## 2022-08-19 DIAGNOSIS — E103513 Type 1 diabetes mellitus with proliferative diabetic retinopathy with macular edema, bilateral: Secondary | ICD-10-CM

## 2022-08-27 ENCOUNTER — Other Ambulatory Visit: Payer: Self-pay | Admitting: Family Medicine

## 2022-08-27 DIAGNOSIS — O10919 Unspecified pre-existing hypertension complicating pregnancy, unspecified trimester: Secondary | ICD-10-CM

## 2022-08-28 ENCOUNTER — Encounter: Payer: Self-pay | Admitting: Cardiology

## 2022-08-28 ENCOUNTER — Ambulatory Visit (INDEPENDENT_AMBULATORY_CARE_PROVIDER_SITE_OTHER): Payer: Medicaid Other | Admitting: Cardiology

## 2022-08-28 VITALS — BP 116/82 | HR 88 | Ht 62.0 in | Wt 108.0 lb

## 2022-08-28 DIAGNOSIS — I10 Essential (primary) hypertension: Secondary | ICD-10-CM | POA: Diagnosis not present

## 2022-08-28 DIAGNOSIS — Z794 Long term (current) use of insulin: Secondary | ICD-10-CM | POA: Diagnosis not present

## 2022-08-28 DIAGNOSIS — O1490 Unspecified pre-eclampsia, unspecified trimester: Secondary | ICD-10-CM | POA: Diagnosis not present

## 2022-08-28 DIAGNOSIS — E119 Type 2 diabetes mellitus without complications: Secondary | ICD-10-CM | POA: Diagnosis not present

## 2022-08-28 MED ORDER — LABETALOL HCL 300 MG PO TABS: 300.0000 mg | ORAL_TABLET | Freq: Two times a day (BID) | ORAL | 3 refills | Status: AC

## 2022-08-28 MED ORDER — NIFEDIPINE ER 60 MG PO TB24: 60.0000 mg | ORAL_TABLET | Freq: Two times a day (BID) | ORAL | 3 refills | Status: DC

## 2022-08-28 NOTE — Progress Notes (Signed)
Cardio-Obstetrics Clinic  Follow Up Note   Date:  09/03/2022   ID:  Kristin Clay, DOB 21-Mar-1986, MRN [id number]  PCP:  Dorna Mai, MD   [facility] Providers Cardiologist:  Berniece Salines, DO  Electrophysiologist:  None        Referring MD: Dorna Mai, MD   Chief Complaint: " I am doing well"  History of Present Illness:    Kristin Clay is a 36 y.o. female [[id number]] who returns for follow up of chronic hypertension and Type 1 diabetes mellitus.   Medical history includes type 1 diabetes which was diagnosed at the age of 80 in her home country in [place] she has been on insulin since, hypertension was previously on losartan hydrochlorothiazide but has been transitioned to nifedipine since she became pregnant, history of severe preeclampsia was referred to the cardio obstetrics clinic for cardiovascular care during pregnancy and assessment of cardiovascular long-term risk.   I saw the patient on January 02, 2022 at that time we talked about her cardiovascular risk her blood pressure was within target no changes were made.  I also started the patient on aspirin 81 mg daily.  I saw the patient on 03/27/2022 at that time she her blood pressure was at target. She was on preeclampsia prophylaxis  Since I saw the patient she has been to the ED for elevated BP. She is doing much better now. Her visit was facilitated by a video interpreter.   Prior CV Studies Reviewed: The following studies were reviewed today:   Past Medical History:  Diagnosis Date   Chronic hypertension    Complication of anesthesia    with hand surgery, local anes did not work, tried twice- had to put her to sleep   Diabetic retinopathy ([place])    DKA (diabetic ketoacidosis) ([place]) 10/2020   Epidural hematoma ([place]) 07/04/2021   Obstetric vaginal laceration with type 3c third degree perineal laceration 06/29/2021   Pyelonephritis 10/2020   Rh negative status during pregnancy  05/01/2021   s/p Rhogam 05/13/21   Type I diabetes mellitus ([place])    dx 63yrs ago   UTI (urinary tract infection)     Past Surgical History:  Procedure Laterality Date   CESAREAN SECTION  05/16/2022   Procedure: CESAREAN SECTION;  Surgeon: Griffin Basil, MD;  [facility]: [facility];  Service: Obstetrics;;   female circumcision Bilateral    clitorectomy as well   FOOT SURGERY  2018   HAND SURGERY  2019      OB History     Gravida  3   Para  2   Term  0   Preterm  2   AB  1   Living  2      SAB  1   IAB  0   Ectopic  0   Multiple  0   Live Births  2               Current Medications: Current Meds  Medication Sig   acetaminophen (TYLENOL) 500 MG tablet Take 1,000 mg by mouth every 6 (six) hours as needed.   aspirin (ASPIRIN LOW DOSE) 81 MG chewable tablet CHEW 1 TABLET BY MOUTH DAILY.   lisinopril (ZESTRIL) 10 MG tablet Take 1 tablet (10 mg total) by mouth daily. (Patient not taking: Reported on 09/01/2022)   Prenatal Vit-Fe Fumarate-FA (PRENATAL VITAMIN) 27-0.8 MG TABS Take 1 tablet by mouth daily.   [DISCONTINUED] furosemide (LASIX) 20 MG tablet Take 1  tablet (20 mg total) by mouth daily.   [DISCONTINUED] insulin aspart (NOVOLOG FLEXPEN) 100 UNIT/ML FlexPen Inject 3 units subcutaneously with meals.   [DISCONTINUED] insulin glargine (LANTUS) 100 UNIT/ML Solostar Pen Inject 22 units subcutaneously nightly.   [DISCONTINUED] Insulin Pen Needle 32G X 4 MM MISC 1 Device by Does not apply route in the morning, at noon, in the evening, and at bedtime.   [DISCONTINUED] Insulin Pen Needle 32G X 4 MM MISC Use as directed   [DISCONTINUED] labetalol (NORMODYNE) 300 MG tablet TAKE 1 TABLET BY MOUTH 2 TIMES DAILY.   [DISCONTINUED] NIFEdipine (ADALAT CC) 60 MG 24 hr tablet Take 1 tablet (60 mg total) by mouth 2 (two) times daily.     Allergies:   Pork-derived products   Social History   Socioeconomic History   Marital status: Married    Spouse name: Not on file    Number of children: Not on file   Years of education: Not on file   Highest education level: Not on file  Occupational History   Not on file  Tobacco Use   Smoking status: Never   Smokeless tobacco: Never  Vaping Use   Vaping Use: Never used  Substance and Sexual Activity   Alcohol use: Never   Drug use: Never   Sexual activity: Not Currently    Birth control/protection: None  Other Topics Concern   Not on file  Social History Narrative   ** Merged History Encounter **       ** Merged History Encounter **       Social Determinants of Health   Financial Resource Strain: High Risk (05/30/2021)   Overall Financial Resource Strain (CARDIA)    Difficulty of Paying Living Expenses: Very hard  Food Insecurity: Food Insecurity Present (03/12/2022)   Hunger Vital Sign    Worried About Running Out of Food in the Last Year: Sometimes true    Ran Out of Food in the Last Year: Sometimes true  Transportation Needs: Unmet Transportation Needs (03/12/2022)   PRAPARE - Administrator, Civil Service (Medical): Yes    Lack of Transportation (Non-Medical): Yes  Physical Activity: Not on file  Stress: Not on file  Social Connections: Not on file      Family History  Problem Relation Age of Onset   Hyperlipidemia Mother    Hypertension Mother    Kidney disease Father    Alzheimer's disease Father       ROS:   Please see the history of present illness.     All other systems reviewed and are negative.   Labs/EKG Reviewed:    EKG:   EKG is was not ordered today.    Recent Labs: 05/06/2022: Magnesium 4.8 05/16/2022: ALT 10 05/18/2022: Hemoglobin 9.3; Platelets 236 09/01/2022: BUN 14; Creatinine, Ser 0.58; Potassium 4.4; Sodium 128; TSH 0.93   Recent Lipid Panel Lab Results  Component Value Date/Time   CHOL 179 10/29/2019 02:43 AM   TRIG 77 10/29/2019 02:43 AM   HDL 35 (L) 10/29/2019 02:43 AM   CHOLHDL 5.1 10/29/2019 02:43 AM   LDLCALC 129 (H) 10/29/2019 02:43 AM     Physical Exam:    VS:  BP 116/82   Pulse 88   Ht 5\' 2"  (1.575 m)   Wt 108 lb (49 kg)   LMP  (Within Weeks)   SpO2 99%   Breastfeeding No   BMI 19.75 kg/m     Wt Readings from Last 3 Encounters:  09/01/22 107 lb  12.8 oz (48.9 kg)  08/28/22 108 lb (49 kg)  07/21/22 105 lb (47.6 kg)     GEN:  Well nourished, well developed in no acute distress HEENT: Normal NECK: No JVD; No carotid bruits LYMPHATICS: No lymphadenopathy CARDIAC: RRR, no murmurs, rubs, gallops RESPIRATORY:  Clear to auscultation without rales, wheezing or rhonchi  ABDOMEN: Soft, non-tender, non-distended MUSCULOSKELETAL:  No edema; No deformity  SKIN: Warm and dry NEUROLOGIC:  Alert and oriented x 3 PSYCHIATRIC:  Normal affect    Risk Assessment/Risk Calculators:                  ASSESSMENT & PLAN:    Chronic hypertension in pregnancy Diabetes Mellitus  High risk pregnancy Hx of severe preeclampsia   She is doing well from a CV standpoint . BP at target . She plans for other children in the future.  Will continue to follow patient closely.  Fu in 6 months   Patient Instructions  Medication Instructions:  Your physician has recommended you make the following change in your medication:  STOP: Lasix *If you need a refill on your cardiac medications before your next appointment, please call your pharmacy*   Lab Work: None  Testing/Procedures: None   Follow-Up: At [facility], you and your health needs are our priority.  As part of our continuing mission to provide you with exceptional heart care, we have created designated Provider Care Teams.  These Care Teams include your primary Cardiologist (physician) and Advanced Practice Providers (APPs -  Physician Assistants and Nurse Practitioners) who all work together to provide you with the care you need, when you need it.  We recommend signing up for the patient portal called "MyChart".  Sign up information is provided on this  After Visit Summary.  MyChart is used to connect with patients for Virtual Visits (Telemedicine).  Patients are able to view lab/test results, encounter notes, upcoming appointments, etc.  Non-urgent messages can be sent to your provider as well.   To learn more about what you can do with MyChart, go to [email or web address].    Your next appointment:   6 month(s)  The format for your next appointment:   In Person  Provider:   Thomasene Ripple, DO [address], [place], [place] [address]   Other Instructions   Important Information About Sugar         Dispo:  Return in about 6 months (around 02/27/2023).   Medication Adjustments/Labs and Tests Ordered: Current medicines are reviewed at length with the patient today.  Concerns regarding medicines are outlined above.  Tests Ordered: No orders of the defined types were placed in this encounter.  Medication Changes: Meds ordered this encounter  Medications   labetalol (NORMODYNE) 300 MG tablet    Sig: Take 1 tablet (300 mg total) by mouth 2 (two) times daily.    Dispense:  180 tablet    Refill:  3   NIFEdipine (ADALAT CC) 60 MG 24 hr tablet    Sig: Take 1 tablet (60 mg total) by mouth 2 (two) times daily.    Dispense:  180 tablet    Refill:  3

## 2022-08-28 NOTE — Patient Instructions (Signed)
Medication Instructions:  Your physician has recommended you make the following change in your medication:  STOP: Lasix *If you need a refill on your cardiac medications before your next appointment, please call your pharmacy*   Lab Work: None  Testing/Procedures: None   Follow-Up: At [facility], you and your health needs are our priority.  As part of our continuing mission to provide you with exceptional heart care, we have created designated Provider Care Teams.  These Care Teams include your primary Cardiologist (physician) and Advanced Practice Providers (APPs -  Physician Assistants and Nurse Practitioners) who all work together to provide you with the care you need, when you need it.  We recommend signing up for the patient portal called "MyChart".  Sign up information is provided on this After Visit Summary.  MyChart is used to connect with patients for Virtual Visits (Telemedicine).  Patients are able to view lab/test results, encounter notes, upcoming appointments, etc.  Non-urgent messages can be sent to your provider as well.   To learn more about what you can do with MyChart, go to [email or web address].    Your next appointment:   6 month(s)  The format for your next appointment:   In Person  Provider:   Berniece Salines, DO [address], [place], [place] [address]   Other Instructions   Important Information About Sugar

## 2022-08-31 ENCOUNTER — Encounter (INDEPENDENT_AMBULATORY_CARE_PROVIDER_SITE_OTHER): Payer: Medicaid Other | Admitting: Ophthalmology

## 2022-08-31 DIAGNOSIS — H35373 Puckering of macula, bilateral: Secondary | ICD-10-CM

## 2022-08-31 DIAGNOSIS — O0992 Supervision of high risk pregnancy, unspecified, second trimester: Secondary | ICD-10-CM

## 2022-08-31 DIAGNOSIS — E103513 Type 1 diabetes mellitus with proliferative diabetic retinopathy with macular edema, bilateral: Secondary | ICD-10-CM

## 2022-08-31 DIAGNOSIS — I10 Essential (primary) hypertension: Secondary | ICD-10-CM

## 2022-08-31 DIAGNOSIS — H35033 Hypertensive retinopathy, bilateral: Secondary | ICD-10-CM

## 2022-09-01 ENCOUNTER — Telehealth: Payer: Self-pay

## 2022-09-01 ENCOUNTER — Encounter: Payer: Self-pay | Admitting: Internal Medicine

## 2022-09-01 ENCOUNTER — Ambulatory Visit (INDEPENDENT_AMBULATORY_CARE_PROVIDER_SITE_OTHER): Payer: Medicaid Other | Admitting: Internal Medicine

## 2022-09-01 ENCOUNTER — Other Ambulatory Visit (HOSPITAL_COMMUNITY): Payer: Self-pay

## 2022-09-01 VITALS — BP 124/80 | HR 81 | Ht 62.0 in | Wt 107.8 lb

## 2022-09-01 DIAGNOSIS — O24012 Pre-existing diabetes mellitus, type 1, in pregnancy, second trimester: Secondary | ICD-10-CM

## 2022-09-01 DIAGNOSIS — E1065 Type 1 diabetes mellitus with hyperglycemia: Secondary | ICD-10-CM | POA: Diagnosis not present

## 2022-09-01 DIAGNOSIS — E1042 Type 1 diabetes mellitus with diabetic polyneuropathy: Secondary | ICD-10-CM | POA: Diagnosis not present

## 2022-09-01 DIAGNOSIS — E103592 Type 1 diabetes mellitus with proliferative diabetic retinopathy without macular edema, left eye: Secondary | ICD-10-CM | POA: Diagnosis not present

## 2022-09-01 DIAGNOSIS — M542 Cervicalgia: Secondary | ICD-10-CM

## 2022-09-01 LAB — POCT GLYCOSYLATED HEMOGLOBIN (HGB A1C): Hemoglobin A1C: 14.5 % — AB (ref 4.0–5.6)

## 2022-09-01 LAB — BASIC METABOLIC PANEL
BUN: 14 mg/dL (ref 6–23)
CO2: 21 mEq/L (ref 19–32)
Calcium: 9.4 mg/dL (ref 8.4–10.5)
Chloride: 96 mEq/L (ref 96–112)
Creatinine, Ser: 0.58 mg/dL (ref 0.40–1.20)
GFR: 115.8 mL/min (ref >60.00)
Glucose, Bld: 566 mg/dL (ref 70–99)
Potassium: 4.4 mEq/L (ref 3.5–5.1)
Sodium: 128 mEq/L — ABNORMAL LOW (ref 135–145)

## 2022-09-01 LAB — MICROALBUMIN / CREATININE URINE RATIO
Creatinine,U: 24 mg/dL
Microalb Creat Ratio: 5.4 mg/g (ref 0.0–30.0)
Microalb, Ur: 1.3 mg/dL (ref 0.0–1.9)

## 2022-09-01 LAB — POCT GLUCOSE (DEVICE FOR HOME USE): POC Glucose: 453 mg/dl — AB (ref 70–99)

## 2022-09-01 MED ORDER — NOVOLOG FLEXPEN 100 UNIT/ML ~~LOC~~ SOPN
5.0000 [IU] | PEN_INJECTOR | Freq: Three times a day (TID) | SUBCUTANEOUS | 2 refills | Status: DC
Start: 2022-09-01 — End: 2023-01-08

## 2022-09-01 MED ORDER — INSULIN PEN NEEDLE 32G X 4 MM MISC: 1.0000 | Freq: Four times a day (QID) | 2 refills | Status: DC

## 2022-09-01 MED ORDER — INSULIN GLARGINE 100 UNIT/ML SOLOSTAR PEN: 22.0000 [IU] | PEN_INJECTOR | Freq: Every day | SUBCUTANEOUS | 2 refills | Status: DC

## 2022-09-01 MED ORDER — DEXCOM G6 TRANSMITTER MISC: 1.0000 | 2 refills | Status: DC

## 2022-09-01 MED ORDER — DEXCOM G6 SENSOR MISC: 1.0000 | 2 refills | Status: DC

## 2022-09-01 NOTE — Patient Instructions (Addendum)
Continue Lantus 22 units daily  Increase Novolog 5 units with each meal  Novolog correctional insulin: ADD extra units on insulin to your meal-time Novolog dose if your blood sugars are higher than 130. Use the scale below to help guide you:   Blood sugar before meal Number of units to inject  Less than 175 0 unit  176 -  220 1 units  221 -  265 2 units  266 -310 3 units  311 - 355 4 units  356 - 400 5 units  401 - 445 6 units  446 - 490 7 units  491 - 535 8 units        HOW TO TREAT LOW BLOOD SUGARS (Blood sugar LESS THAN 70 MG/DL) Please follow the RULE OF 15 for the treatment of hypoglycemia treatment (when your (blood sugars are less than 70 mg/dL)   STEP 1: Take 15 grams of carbohydrates when your blood sugar is low, which includes:  3-4 GLUCOSE TABS  OR 3-4 OZ OF JUICE OR REGULAR SODA OR ONE TUBE OF GLUCOSE GEL    STEP 2: RECHECK blood sugar in 15 MINUTES STEP 3: If your blood sugar is still low at the 15 minute recheck --> then, go back to STEP 1 and treat AGAIN with another 15 grams of carbohydrates.

## 2022-09-01 NOTE — Telephone Encounter (Signed)
Received notification from [facility] regarding a prior authorization for Dexcom G6 Sensor.   Authorization has been submitted and is pending.   Key: [id number] PA Case ID: [id number]

## 2022-09-01 NOTE — Progress Notes (Unsigned)
Name: Kristin Clay  MRN/ DOB: [id number], 02/19/1986   Age/ Sex: 36 y.o., female    PCP: Dorna Mai, MD   Reason for Endocrinology Evaluation: Type 1 Diabetes Mellitus     Date of Initial Endocrinology Visit: 03/18/2022    PATIENT IDENTIFIER: Kristin Clay is a 36 y.o. female with a past medical history of T1 DM. The patient presented for initial endocrinology clinic visit on 03/18/2022 for consultative assistance with her diabetes management.    HPI: [name] was    Diagnosed with T1DM at age 62          Hemoglobin A1c has ranged from 8.4% in 2023, peaking at 13.0% in 2020. Patient has required hospitalization within the last 1 year from hyper or hypoglycemia: Yes 02/2022 with DKA  She is currently 24.5 weeks of gestation with a boy  She has 39 months old baby   Paternal aunts with T1 diabetes   Pt with tingling of hands and feet, she has partial ring finger amputations B/L    S/P delivery 05/19/2022  SUBJECTIVE:   During the last visit (04/28/2022): A1c 4.4%   Today (09/01/22): Kristin Clay is here for a follow up on diabetes management. She checks  occasionally . 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 Denies: CKD  Last eye exam: Completed 05/25/2022  Macrovascular complications:   Denies: CAD, PVD, CVA   PAST HISTORY: Past Medical History:  Past Medical History:  Diagnosis Date   Chronic hypertension    Complication of anesthesia    with hand surgery, local anes did not work, tried twice- had to put  her to sleep   Diabetic retinopathy ([place])    DKA (diabetic ketoacidosis) ([place]) 10/2020   Epidural hematoma ([place]) 07/04/2021   Obstetric vaginal laceration with type 3c third degree perineal laceration 06/29/2021   Pyelonephritis 10/2020   Rh negative status during pregnancy 05/01/2021   s/p Rhogam 05/13/21   Type I diabetes mellitus ([place])    dx 50yrs ago   UTI (urinary tract infection)    Past Surgical History:  Past Surgical History:  Procedure Laterality Date   CESAREAN SECTION  05/16/2022   Procedure: CESAREAN SECTION;  Surgeon: Griffin Basil, MD;  Location: [facility];  Service: Obstetrics;;   female circumcision Bilateral    clitorectomy as well   FOOT SURGERY  2018   HAND SURGERY  2019    Social History:  reports that she has never smoked. She has never used smokeless tobacco. She reports that she does not drink alcohol and does not use drugs. Family History:  Family History  Problem Relation Age of Onset   Hyperlipidemia Mother    Hypertension Mother    Kidney disease Father    Alzheimer's disease Father      HOME MEDICATIONS: Allergies as of 09/01/2022       Reactions   Pork-derived Products         Medication List  Accurate as of September 01, 2022  2:03 PM. If you have any questions, ask your nurse or doctor.          acetaminophen 500 MG tablet Commonly known as: TYLENOL Take 1,000 mg by mouth every 6 (six) hours as needed.   aspirin 81 MG chewable tablet Commonly known as: Aspirin Low Dose CHEW 1 TABLET BY MOUTH DAILY.   Insulin Pen Needle 32G X 4 MM Misc 1 Device by Does not apply route in the morning, at noon, in the evening, and at bedtime.   Pentips 32G X 4 MM Misc Generic drug: Insulin Pen Needle Use as directed   labetalol 300 MG tablet Commonly known as: NORMODYNE Take 1 tablet (300 mg total) by mouth 2 (two) times daily.   Lantus SoloStar 100 UNIT/ML Solostar Pen Generic drug: insulin glargine Inject 22 units  subcutaneously nightly.   lisinopril 10 MG tablet Commonly known as: ZESTRIL Take 1 tablet (10 mg total) by mouth daily.   NIFEdipine 60 MG 24 hr tablet Commonly known as: ADALAT CC Take 1 tablet (60 mg total) by mouth 2 (two) times daily.   NovoLOG FlexPen 100 UNIT/ML FlexPen Generic drug: insulin aspart Inject 3 units subcutaneously with meals.   oxyCODONE-acetaminophen 5-325 MG tablet Commonly known as: PERCOCET/ROXICET Take 1-2 tablets by mouth every 6 (six) hours as needed.   Prenatal Vitamin 27-0.8 MG Tabs Take 1 tablet by mouth daily.         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  Neuro: MS is good with appropriate affect, pt is alert and Ox3    DM foot exam: 03/18/2022  The skin of the feet is intact without sores or ulcerations. The pedal pulses are 2+ on right and 2+ on left. The sensation is decreased to a screening 5.07, 10 gram monofilament bilaterally   DATA REVIEWED:  Lab Results  Component Value Date   HGBA1C 14.5 (A) 09/01/2022   HGBA1C 7.4 (A) 04/28/2022   HGBA1C 8.4 (H) 02/26/2022    Latest Reference Range & Units 09/01/22 14:20  Sodium 135 - 145 mEq/L 128 (L)  Potassium 3.5 - 5.1 mEq/L 4.4  Chloride 96 - 112 mEq/L 96  CO2 19 - 32 mEq/L 21  Glucose 70 - 99 mg/dL 11/01/22 (HH)  BUN 6 - 23 mg/dL 14  Creatinine 858 -  1.20 mg/dL 8.50  Calcium 8.4 - 2.77 mg/dL 9.4  GFR 41.2 mL/min 115.80  MICROALB/CREAT RATIO 0.0 - 30.0 mg/g 5.4    Latest Reference Range & Units 09/01/22 14:20  TSH 0.35 - 5.50 uIU/mL 0.93  T4,Free(Direct) 0.60 - 1.60 ng/dL 11/01/22    Latest Reference Range & Units 09/01/22 14:20  Microalb, Ur 0.0 - 1.9 mg/dL 1.3  MICROALB/CREAT RATIO 0.0 -  30.0 mg/g 5.4  Creatinine,U mg/dL 25.6    ASSESSMENT / PLAN / RECOMMENDATIONS:   1) Type 1 Diabetes Mellitus, poorly controlled, With neuropathic, and retinopathic and neuropathic complications - Most recent A1c of  14.4%. Goal A1c < 7.0%.    -Patient continues with medication nonadherence and dietary indiscretion -Her in office BG 453 mg/DL, the patient does admit to not taking prandial dose of insulin this morning while having milk and cookies -We again emphasized the importance of taking prandial dose of insulin with each meal -I have encouraged the patient to optimize glucose control to prevent further diabetes complications -She is not been using freestyle libre, she brought a box with her, of note the patient has been trained multiple times on using CGM technology -She also brought OmniPod box with her, I did explain to her again that we do not do training and our CDE will do the training, and new referral has been placed for training -A prescription of Dexcom has been sent -A refill of her insulin has been sent to the pharmacy -I will increase her prandial dose of insulin as below -Will be provided with a new correction scale -She assures me that she only uses Lantus once daily, in the past she has been going back and forth between using it once or twice daily    MEDICATIONS: Continue Lantus 22 units  Increase NovoLog 5 units 3 times daily before every meal Start correction factor: NovoLog (BG -130/45)  EDUCATION / INSTRUCTIONS: BG monitoring instructions: Patient is instructed to check her blood sugars 4 times a day, fasting and 2  hours post meals. Call [facility] if: BG persistently < 60  I reviewed the Rule of 15 for the treatment of hypoglycemia in detail with the patient. Literature supplied.   2) Diabetic complications:  Eye: Does  have known diabetic retinopathy.  Neuro/ Feet: Does  have known diabetic peripheral neuropathy. Renal: Patient does not have known baseline CKD. She is not on an ACEI/ARB at present.   3) Throat/neck pain:  -I am unclear about this, she did endorse tenderness around the isthmic area, but her thyroid gland is not enlarged -She may have painless thyroiditis given that she is 4 months postpartum -We will seed with thyroid ultrasound and TFTs -TFTs are normal  Follow-up in 4 months   Signed electronically by: Lyndle Herrlich, MD  [facility]  [facility] [address] [place] [place], [place] [address] Phone: [phone] FAX: [phone]   CC: Georganna Skeans, MD [address] [place] [address] Phone: [phone]  Fax: [phone]    Return to Endocrinology clinic as below: Future Appointments  Date Time Provider Department Center  09/04/2022  1:00 PM Rennis Chris, MD [facility] None  02/19/2023 11:00 AM Tobb, Lavona Mound, DO [facility] None

## 2022-09-01 NOTE — Telephone Encounter (Signed)
Kristin Clay from [place] lab called with critical. Patient has glucose of 566. Tried calling patient to have her check blood sugar now to see what it is and no answer.

## 2022-09-01 NOTE — Telephone Encounter (Signed)
I spoke with Dr Cruzita Lederer and let her know I called patient again and blood sugar came down to 288.

## 2022-09-02 ENCOUNTER — Other Ambulatory Visit (HOSPITAL_COMMUNITY): Payer: Self-pay

## 2022-09-02 LAB — TSH: TSH: 0.93 u[IU]/mL (ref 0.35–5.50)

## 2022-09-02 LAB — T4, FREE: Free T4: 1.11 ng/dL (ref 0.60–1.60)

## 2022-09-02 NOTE — Telephone Encounter (Signed)
Received a fax regarding Prior Authorization from [employer] for Kristin Clay.  Authorization has been DENIED because Per your health plan's criteria, this product is covered if you meet the following: Your doctor tells [place] that you are using the continuous glucose monitoring system as prescribed.  PA #: [id number]

## 2022-09-02 NOTE — Telephone Encounter (Signed)
The appeal will be submitted for the Dexcom G6 sensor.

## 2022-09-03 NOTE — Telephone Encounter (Signed)
Patient Advocate Encounter  Prior Authorization for [employer] and Dexcom [employer] has been approved.    PA#  [id number] / [id number] Key: [id number]  Effective dates: 09/03/2022 through 03/04/2023

## 2022-09-04 ENCOUNTER — Encounter (INDEPENDENT_AMBULATORY_CARE_PROVIDER_SITE_OTHER): Payer: Medicaid Other | Admitting: Ophthalmology

## 2022-09-04 ENCOUNTER — Encounter (INDEPENDENT_AMBULATORY_CARE_PROVIDER_SITE_OTHER): Payer: Self-pay

## 2022-09-04 DIAGNOSIS — O0992 Supervision of high risk pregnancy, unspecified, second trimester: Secondary | ICD-10-CM

## 2022-09-04 DIAGNOSIS — H35373 Puckering of macula, bilateral: Secondary | ICD-10-CM

## 2022-09-04 DIAGNOSIS — I10 Essential (primary) hypertension: Secondary | ICD-10-CM

## 2022-09-04 DIAGNOSIS — H35033 Hypertensive retinopathy, bilateral: Secondary | ICD-10-CM

## 2022-09-04 DIAGNOSIS — E103513 Type 1 diabetes mellitus with proliferative diabetic retinopathy with macular edema, bilateral: Secondary | ICD-10-CM

## 2022-09-07 ENCOUNTER — Encounter (INDEPENDENT_AMBULATORY_CARE_PROVIDER_SITE_OTHER): Payer: Medicaid Other | Admitting: Ophthalmology

## 2022-09-08 NOTE — Progress Notes (Addendum)
[facility] - Clinic Note  09/11/2022     CHIEF COMPLAINT Patient presents for Retina Follow Up   HISTORY OF PRESENT ILLNESS: Kristin Clay is a 36 y.o. female who presents to the clinic today for:   HPI     Retina Follow Up   Patient presents with  Diabetic Retinopathy.  In both eyes.  This started 6 weeks ago.  I, the attending physician,  performed the HPI with the patient and updated documentation appropriately.        Comments   Patient here for 6 weeks retina follow up for PDR OU. Patient states vision doing good. No eye pain. Can't read well.       Last edited by Rennis Chris, MD on 09/11/2022 11:47 AM.    Pt states vision is doing well, no fol or floaters   Referring physician: Aura Camps MD [address], [address] [place], [place] [address]  HISTORICAL INFORMATION:   Selected notes from the MEDICAL RECORD NUMBER Referred by Dr. Karleen Hampshire for diabetic retinal evaluation LEE:  Ocular Hx- PMH-pregnancy, DM    CURRENT MEDICATIONS: No current outpatient medications on file. (Ophthalmic Drugs)   No current facility-administered medications for this visit. (Ophthalmic Drugs)   Current Outpatient Medications (Other)  Medication Sig   acetaminophen (TYLENOL) 500 MG tablet Take 1,000 mg by mouth every 6 (six) hours as needed.   aspirin (ASPIRIN LOW DOSE) 81 MG chewable tablet CHEW 1 TABLET BY MOUTH EVERY DAY   Continuous Blood Gluc Sensor (DEXCOM G6 SENSOR) MISC 1 Device by Does not apply route as directed.   Continuous Blood Gluc Transmit (DEXCOM G6 TRANSMITTER) MISC 1 Device by Does not apply route as directed.   insulin aspart (NOVOLOG FLEXPEN) 100 UNIT/ML FlexPen Inject 5 Units into the skin 3 (three) times daily with meals. Max daily 30 units   insulin glargine (LANTUS) 100 UNIT/ML Solostar Pen Inject 22 Units into the skin at bedtime.   Insulin Pen Needle 32G X 4 MM MISC 1 Device by Does not apply route in the morning,  at noon, in the evening, and at bedtime.   labetalol (NORMODYNE) 300 MG tablet Take 1 tablet (300 mg total) by mouth 2 (two) times daily.   NIFEdipine (ADALAT CC) 60 MG 24 hr tablet Take 1 tablet (60 mg total) by mouth 2 (two) times daily.   Prenatal Vit-Fe Fumarate-FA (PRENATAL VITAMIN) 27-0.8 MG TABS Take 1 tablet by mouth daily.   lisinopril (ZESTRIL) 10 MG tablet Take 1 tablet (10 mg total) by mouth daily. (Patient not taking: Reported on 09/01/2022)   oxyCODONE-acetaminophen (PERCOCET/ROXICET) 5-325 MG tablet Take 1-2 tablets by mouth every 6 (six) hours as needed. (Patient not taking: Reported on 09/01/2022)   No current facility-administered medications for this visit. (Other)   REVIEW OF SYSTEMS: ROS   Positive for: Endocrine, Eyes Negative for: Constitutional, Gastrointestinal, Neurological, Skin, Genitourinary, Musculoskeletal, HENT, Cardiovascular, Respiratory, Psychiatric, Allergic/Imm, Heme/Lymph Last edited by Laddie Aquas, COA on 09/11/2022  9:13 AM.     ALLERGIES Allergies  Allergen Reactions   Pork-Derived Products    PAST MEDICAL HISTORY Past Medical History:  Diagnosis Date   Chronic hypertension    Complication of anesthesia    with hand surgery, local anes did not work, tried twice- had to put her to sleep   Diabetic retinopathy (HCC)    DKA (diabetic ketoacidosis) (HCC) 10/2020   Epidural hematoma (HCC) 07/04/2021   Obstetric vaginal laceration with type  3c third degree perineal laceration 06/29/2021   Pyelonephritis 10/2020   Rh negative status during pregnancy 05/01/2021   s/p Rhogam 05/13/21   Type I diabetes mellitus (HCC)    dx 43yrs ago   UTI (urinary tract infection)    Past Surgical History:  Procedure Laterality Date   CESAREAN SECTION  05/16/2022   Procedure: CESAREAN SECTION;  Surgeon: Warden Fillers, MD;  Location: [facility] LD ORS;  Service: Obstetrics;;   female circumcision Bilateral    clitorectomy as well   FOOT SURGERY  2018   HAND  SURGERY  2019   FAMILY HISTORY Family History  Problem Relation Age of Onset   Hyperlipidemia Mother    Hypertension Mother    Kidney disease Father    Alzheimer's disease Father    SOCIAL HISTORY Social History   Tobacco Use   Smoking status: Never   Smokeless tobacco: Never  Vaping Use   Vaping Use: Never used  Substance Use Topics   Alcohol use: Never   Drug use: Never       OPHTHALMIC EXAM:  Base Eye Exam     Visual Acuity (Snellen - Linear)       Right Left   Dist [place] 20/25 -2 20/20   Dist ph [place] NI          Tonometry (Tonopen, 9:11 AM)       Right Left   Pressure 16 16         Pupils       Dark Light Shape React APD   Right 3 2 Round Brisk None   Left 3 2 Round Brisk None         Visual Fields (Counting fingers)       Left Right    Full Full         Extraocular Movement       Right Left    Full, Ortho Full, Ortho         Neuro/Psych     Oriented x3: Yes   Mood/Affect: Normal         Dilation     Both eyes: 1.0% Mydriacyl, 2.5% Phenylephrine @ 9:11 AM           Slit Lamp and Fundus Exam     Slit Lamp Exam       Right Left   Lids/Lashes normal normal   Conjunctiva/Sclera mild melanosis, Trace Injection, +corkscrew vessels nasally mild melanosis   Cornea trace PEE trace PEE   Anterior Chamber deep, clear, narrow temporal angle deep and quiet   Iris round and dilated, no NVI round and dilated, no NVI   Lens Clear Clear   Anterior Vitreous syneresis syneresis         Fundus Exam       Right Left   Disc pink and sharp; compact; mild hyperemia; +fibrosis, fine NVD -- regressing; +PPP pink and sharp; compact; +fibrosis w/ early fine NVD - regressing   C/D Ratio 0.2 0.2   Macula good foveal reflex, scattered MA/DBH, exudates, +CWS, fine NVE inferior and temporal macula - regressing, ERM with striae nasally good foveal reflex, ERM with striae, scattered MA/DBH greatest temporal macula, exudates temporally, scattered  fibrosis and NV   Vessels attenuated, Tortuous, early NVE greatest inferior arcades -- ?regressing, Copper wiring attenuated, copper wiring, +NVE -- scattered, +fibrosis   Periphery attached, 360 MA/DBH, good 360 PRP laser changes and posterior fill in, scattered NV-regressing attached, 360 MA/DBH, good 360 PRP with room  for fill in, scattered NV           IMAGING AND PROCEDURES  Imaging and Procedures for 09/11/2022  OCT, Retina - OU - Both Eyes       Right Eye Quality was good. Central Foveal Thickness: 261. Progression has been stable. Findings include normal foveal contour, no SRF, intraretinal hyper-reflective material, intraretinal fluid, vitreomacular adhesion , preretinal fibrosis (Mild persistent IRF / IRHM-- slightly improved, +PRF overlying disc and inferior macula).   Left Eye Quality was good. Central Foveal Thickness: 275. Progression has been stable. Findings include no IRF, no SRF, abnormal foveal contour, epiretinal membrane, macular pucker, vitreous traction, preretinal fibrosis (Mild cystic changes inferior macula, pre retinal fibrosis overlying disc extending to macula w/ mild traction).   Notes *Images captured and stored on drive  Diagnosis / Impression:  PDR w/ DME OU OD: Mild persistent IRF / IRHM-- slightly improved, +PRF overlying disc and inferior macula OS: Mild cystic changes inferior macula, pre retinal fibrosis overlying disc extending to macula w/ mild traction  Clinical management:  See below  Abbreviations: NFP - Normal foveal profile. CME - cystoid macular edema. PED - pigment epithelial detachment. IRF - intraretinal fluid. SRF - subretinal fluid. EZ - ellipsoid zone. ERM - epiretinal membrane. ORA - outer retinal atrophy. ORT - outer retinal tubulation. SRHM - subretinal hyper-reflective material. IRHM - intraretinal hyper-reflective material      Fluorescein Angiography Optos (Transit OD)       Right Eye Progression has no prior data.  Early phase findings include leakage, staining, microaneurysm, retinal neovascularization, vascular perfusion defect. Mid/Late phase findings include leakage, staining, microaneurysm, retinal neovascularization (360 mid zonal NVE with leakage).   Left Eye Progression has no prior data. Early phase findings include staining, microaneurysm, retinal neovascularization, vascular perfusion defect. Mid/Late phase findings include leakage, staining, microaneurysm, retinal neovascularization, vascular perfusion defect (360 mid zonal NVE with leakage).   Notes **Images stored on drive**  Impression: PDR OU OD: 360 mid zonal NVE with leakage OS: 360 mid zonal NVE with leakage     Panretinal Photocoagulation - OS - Left Eye       LASER PROCEDURE NOTE  Diagnosis:   Proliferative Diabetic Retinopathy, LEFT EYE  Procedure:  Pan-retinal photocoagulation using slit lamp laser, LEFT EYE, fill-in  Anesthesia:  Topical  Surgeon: Bernarda Caffey, MD, PhD   Informed consent obtained, operative eye marked, and time out performed prior to initiation of laser.   Lumenis [id number] slit lamp laser Pattern: 3x3 square Power: 280 mW Duration: 30 msec  Spot size: 200 microns  # spots: 1422 spots 360 posterior fill-in  Complications: None.  RTC: 3-4 wks -- DFE/OCT  Patient tolerated the procedure well and received written and verbal post-procedure care information/education.            ASSESSMENT/PLAN:    ICD-10-CM   1. 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 Epiretinal membrane (ERM) of both eyes  H35.373     3. Essential hypertension  I10     4. Hypertensive retinopathy of both eyes  H35.033      1,2. 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, both eyes  - s/p PRP OD  (03.27.23), fill in (05.15.23)  - s/p PRP OS (04.17.23) - last A1c  14.5 on 09/01/22; 7.4 on 06.06.23; 8.4 on 04.06.23; 10.4 on 01.23.23 - BCVA 20/25 OU - FA 10.20.23 shows 360 midzonal NVE w/ leakage OU - exam shows early +NVD improving and persistent NVE OU - now w/ good PRP laser OU w/ good fill in OD; OS w/ room for fill in - OCT OD: Mild persistent IRF / IRHM-- slightly improved, +PRF overlying disc and inferior macula; OS: Mild cystic changes inferior macula, pre retinal fibrosis overlying disc extending to macula w/ mild traction - recommend fill in PRP OS today, 10.20.23. - Pt in agreement to proceed with PRP laser OS.  - RBA of procedure discussed, questions answered - informed consent obtained and signed - see procedure note  - f/u 3-4 weeks -- DFE/OCT   3,4. History of high risk pregnancy w/ +HTN and hypertensive retinopathy  - s/p Cesarean 06.24.23 (baby boy) - 33 weeks and 1 day, 3lbs, now out of the NICU and at home - history of pre-eclampsia - BP improved since delivery - discussed importance of tight BP control  - monitor   Ophthalmic Meds Ordered this visit:  No orders of the defined types were placed in this encounter.    Return for 3-4wk f/u PDR OU, DFE, OCT.  There are no Patient Instructions on file for this visit.  Explained the diagnoses, plan, and follow up with the patient and they expressed understanding.  Patient expressed understanding of the importance of proper follow up care.   This document serves as a record of services personally performed by Karie Chimera, MD, PhD. It was created on their behalf by Glee Arvin. Manson Passey, OA an ophthalmic technician. The creation of this record is the provider's dictation and/or activities during the visit.    Electronically signed by: Glee Arvin. Manson Passey, [place] 10.17.2023 11:56 AM  This document serves as a record of services personally performed by Karie Chimera, MD, PhD. It was created on their behalf by Gerilyn Nestle, COT an ophthalmic technician. The creation of this record is the provider's dictation and/or activities during the visit.    Electronically signed by:  Gerilyn Nestle, COT  10.20.23 11:56 AM  Karie Chimera, M.D., Ph.D. Diseases & Surgery of the Retina and Vitreous [facility]  I have reviewed the above documentation for accuracy and completeness, and I agree with the above. Karie Chimera, M.D., Ph.D. 09/11/22 11:56 AM  Abbreviations: M myopia (nearsighted); A astigmatism; H hyperopia (farsighted); P presbyopia; Mrx spectacle prescription;  CTL contact lenses; OD right eye; OS left eye; OU both eyes  XT exotropia; ET esotropia; PEK punctate epithelial keratitis; PEE punctate epithelial erosions; DES dry eye syndrome; MGD meibomian gland dysfunction; ATs artificial tears; PFAT's preservative free artificial tears; NSC nuclear sclerotic cataract; PSC posterior subcapsular cataract; ERM epi-retinal membrane; PVD posterior vitreous detachment; RD retinal detachment; DM diabetes mellitus; DR diabetic retinopathy; NPDR non-proliferative diabetic retinopathy; PDR proliferative diabetic retinopathy; CSME clinically significant macular edema; DME diabetic macular edema; dbh dot blot hemorrhages; CWS cotton wool spot; POAG primary open angle glaucoma; C/D cup-to-disc ratio; HVF humphrey visual field; GVF goldmann visual field; OCT optical coherence tomography; IOP intraocular pressure; BRVO Branch retinal vein occlusion; CRVO central retinal vein occlusion; CRAO central retinal artery occlusion; BRAO branch retinal artery occlusion; RT retinal tear; SB scleral buckle; PPV pars plana vitrectomy; VH Vitreous hemorrhage; PRP panretinal laser photocoagulation; IVK intravitreal kenalog; VMT vitreomacular traction; MH Macular hole;  NVD neovascularization of the disc; NVE neovascularization elsewhere; AREDS  age related eye disease study; ARMD age related macular degeneration; POAG  primary open angle glaucoma; EBMD epithelial/anterior basement membrane dystrophy; ACIOL anterior chamber intraocular lens; IOL intraocular lens; PCIOL posterior chamber intraocular lens; Phaco/IOL phacoemulsification with intraocular lens placement; PRK photorefractive keratectomy; LASIK laser assisted in situ keratomileusis; HTN hypertension; DM diabetes mellitus; COPD chronic obstructive pulmonary disease

## 2022-09-09 ENCOUNTER — Ambulatory Visit
Admission: RE | Admit: 2022-09-09 | Discharge: 2022-09-09 | Disposition: A | Discharge status: Home / Self Care | Payer: Medicaid Other | Source: Ambulatory Visit | Attending: Internal Medicine | Admitting: Internal Medicine

## 2022-09-09 DIAGNOSIS — E049 Nontoxic goiter, unspecified: Secondary | ICD-10-CM | POA: Diagnosis not present

## 2022-09-09 DIAGNOSIS — M542 Cervicalgia: Secondary | ICD-10-CM

## 2022-09-11 ENCOUNTER — Ambulatory Visit (INDEPENDENT_AMBULATORY_CARE_PROVIDER_SITE_OTHER): Payer: Medicaid Other | Admitting: Ophthalmology

## 2022-09-11 ENCOUNTER — Encounter (INDEPENDENT_AMBULATORY_CARE_PROVIDER_SITE_OTHER): Payer: Self-pay | Admitting: Ophthalmology

## 2022-09-11 DIAGNOSIS — I10 Essential (primary) hypertension: Secondary | ICD-10-CM | POA: Diagnosis not present

## 2022-09-11 DIAGNOSIS — H35373 Puckering of macula, bilateral: Secondary | ICD-10-CM

## 2022-09-11 DIAGNOSIS — H35033 Hypertensive retinopathy, bilateral: Secondary | ICD-10-CM | POA: Diagnosis not present

## 2022-09-11 DIAGNOSIS — E103513 Type 1 diabetes mellitus with proliferative diabetic retinopathy with macular edema, bilateral: Secondary | ICD-10-CM | POA: Diagnosis not present

## 2022-09-29 ENCOUNTER — Encounter: Payer: Medicaid Other | Admitting: Nutrition

## 2022-10-05 NOTE — Progress Notes (Signed)
[facility] Note  10/09/2022     CHIEF COMPLAINT Patient presents for Retina Follow Up   HISTORY OF PRESENT ILLNESS: Kristin Clay is a 36 y.o. female who presents to the clinic today for:   HPI     Retina Follow Up   Patient presents with  Diabetic Retinopathy.  In both eyes.  This started -1 weeks ago.  Duration of -1.  I, the attending physician,  performed the HPI with the patient and updated documentation appropriately.        Comments   Retina follow up PDR OU pt is reporting that her OS vision got a little blurred last week but has since went back to normal her last blood sugar was 159 yesterday       Last edited by Bernarda Caffey, MD on 10/09/2022  8:27 PM.    Pt states vision is blurry today. She states that she will be increasing her insulin.   Referring physician: Gevena Cotton MD [address] [place], [place] [address]  HISTORICAL INFORMATION:   Selected notes from the MEDICAL RECORD NUMBER Referred by Dr. Frederico Hamman for diabetic retinal evaluation LEE:  Ocular Hx- PMH-pregnancy, DM    CURRENT MEDICATIONS: No current outpatient medications on file. (Ophthalmic Drugs)   No current facility-administered medications for this visit. (Ophthalmic Drugs)   Current Outpatient Medications (Other)  Medication Sig   acetaminophen (TYLENOL) 500 MG tablet Take 1,000 mg by mouth every 6 (six) hours as needed.   aspirin (ASPIRIN LOW DOSE) 81 MG chewable tablet CHEW 1 TABLET BY MOUTH EVERY DAY   Continuous Blood Gluc Sensor (DEXCOM G6 SENSOR) MISC 1 Device by Does not apply route as directed.   Continuous Blood Gluc Transmit (DEXCOM G6 TRANSMITTER) MISC 1 Device by Does not apply route as directed.   insulin aspart (NOVOLOG FLEXPEN) 100 UNIT/ML FlexPen Inject 5 Units into the skin 3 (three) times daily with meals. Max daily 30 units   insulin glargine (LANTUS) 100 UNIT/ML Solostar Pen Inject 22 Units into the skin  at bedtime.   Insulin Pen Needle 32G X 4 MM MISC 1 Device by Does not apply route in the morning, at noon, in the evening, and at bedtime.   labetalol (NORMODYNE) 300 MG tablet Take 1 tablet (300 mg total) by mouth 2 (two) times daily.   lisinopril (ZESTRIL) 10 MG tablet Take 1 tablet (10 mg total) by mouth daily. (Patient not taking: Reported on 09/01/2022)   NIFEdipine (ADALAT CC) 60 MG 24 hr tablet Take 1 tablet (60 mg total) by mouth 2 (two) times daily.   oxyCODONE-acetaminophen (PERCOCET/ROXICET) 5-325 MG tablet Take 1-2 tablets by mouth every 6 (six) hours as needed. (Patient not taking: Reported on 09/01/2022)   Prenatal Vit-Fe Fumarate-FA (PRENATAL VITAMIN) 27-0.8 MG TABS Take 1 tablet by mouth daily.   No current facility-administered medications for this visit. (Other)   REVIEW OF SYSTEMS:   ALLERGIES Allergies  Allergen Reactions   Pork-Derived Products    PAST MEDICAL HISTORY Past Medical History:  Diagnosis Date   Chronic hypertension    Complication of anesthesia    with hand surgery, local anes did not work, tried twice- had to put her to sleep   Diabetic retinopathy ([place])    DKA (diabetic ketoacidosis) ([place]) 10/2020   Epidural hematoma ([place]) 07/04/2021   Obstetric vaginal laceration with type 3c third degree perineal laceration 06/29/2021   Pyelonephritis 10/2020   Rh negative status  during pregnancy 05/01/2021   s/p Rhogam 05/13/21   Type I diabetes mellitus ([place])    dx 58yr ago   UTI (urinary tract infection)    Past Surgical History:  Procedure Laterality Date   CESAREAN SECTION  05/16/2022   Procedure: CESAREAN SECTION;  Surgeon: BGriffin Basil MD;  Location: [facility] LD ORS;  Service: Obstetrics;;   female circumcision Bilateral    clitorectomy as well   FOOT SURGERY  2018   HAND SURGERY  2019   FAMILY HISTORY Family History  Problem Relation Age of Onset   Hyperlipidemia Mother    Hypertension Mother    Kidney disease Father    Alzheimer's disease  Father    SOCIAL HISTORY Social History   Tobacco Use   Smoking status: Never   Smokeless tobacco: Never  Vaping Use   Vaping Use: Never used  Substance Use Topics   Alcohol use: Never   Drug use: Never       OPHTHALMIC EXAM:  Base Eye Exam     Visual Acuity (Snellen - Linear)       Right Left   Dist [place] 20/30 +1 20/30 +1   Dist ph [place] NI NI         Tonometry (Tonopen, 9:24 AM)       Right Left   Pressure 24 22         Pupils       Pupils Dark Light Shape React APD   Right PERRL 3 2 Round Minimal None   Left PERRL 3 2 Round Minimal None         Visual Fields       Left Right    Full Full         Extraocular Movement       Right Left    Full, Ortho Full, Ortho         Neuro/Psych     Oriented x3: Yes   Mood/Affect: Normal         Dilation     Both eyes: 2.5% Phenylephrine @ 9:24 AM           Slit Lamp and Fundus Exam     Slit Lamp Exam       Right Left   Lids/Lashes normal normal   Conjunctiva/Sclera mild melanosis, Trace Injection, +corkscrew vessels nasally mild melanosis   Cornea trace PEE trace PEE   Anterior Chamber deep, clear, narrow temporal angle deep and quiet   Iris round and dilated, no NVI round and dilated, no NVI   Lens Clear Clear   Anterior Vitreous syneresis syneresis         Fundus Exam       Right Left   Disc pink and sharp; compact; mild hyperemia; +fibrosis, fine NVD -- regressing; +PPP pink and sharp; compact; +fibrosis w/ fine NVD   C/D Ratio 0.2 0.2   Macula good foveal reflex, scattered MA/DBH, exudates, +CWS, fine NVE inferior and temporal macula - regressing, ERM with striae nasally good foveal reflex, ERM with striae, scattered MA/DBH greatest temporal macula, exudates temporally, scattered fibrosis and NV, new preretinal heme superior macula   Vessels attenuated, Tortuous, early NVE greatest inferior arcades -- ?regressing, Copper wiring attenuated, copper wiring, +NVE -- scattered,  +fibrosis   Periphery attached, 360 MA/DBH, good 360 PRP laser changes and posterior fill in, scattered NV-regressing attached, 360 MA/DBH, good 360 PRP with room for fill in, scattered NV  IMAGING AND PROCEDURES  Imaging and Procedures for 10/09/2022  OCT, Retina - OU - Both Eyes       Right Eye Quality was good. Central Foveal Thickness: 254. Progression has been stable. Findings include normal foveal contour, no SRF, intraretinal hyper-reflective material, intraretinal fluid, vitreomacular adhesion , preretinal fibrosis (Mild persistent IRF / IRHM-- greatest temp macula +PRF overlying disc and inferior macula).   Left Eye Quality was good. Central Foveal Thickness: 271. Progression has worsened. Findings include normal foveal contour, no IRF, no SRF, epiretinal membrane, macular pucker, vitreous traction, preretinal fibrosis (Mild cystic changes inferior macula-- Improved, pre retinal fibrosis overlying disc extending to macula w/ mild traction, new pre retinal hyper reflective material ST macula).   Notes *Images captured and stored on drive  Diagnosis / Impression:  PDR w/ DME OU OD: Mild persistent IRF / IRHM-- greatest temp macula +PRF overlying disc and inferior macula OS: Mild cystic changes inferior macula-- Improved, pre retinal fibrosis overlying disc extending to macula w/ mild traction, new pre retinal hyper reflective material ST macula  Clinical management:  See below  Abbreviations: NFP - Normal foveal profile. CME - cystoid macular edema. PED - pigment epithelial detachment. IRF - intraretinal fluid. SRF - subretinal fluid. EZ - ellipsoid zone. ERM - epiretinal membrane. ORA - outer retinal atrophy. ORT - outer retinal tubulation. SRHM - subretinal hyper-reflective material. IRHM - intraretinal hyper-reflective material            ASSESSMENT/PLAN:    ICD-10-CM   1. 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 Epiretinal membrane (ERM) of both eyes  H35.373     3. Essential hypertension  I10     4. Hypertensive retinopathy of both eyes  H35.033      1,2. 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, both eyes  - s/p PRP OD (03.27.23), fill in (05.15.23)  - s/p PRP OS (04.17.23), fill in (10.20.23) - last A1c 14.5 on (09/01/22), 7.4 on (06.06.23), 8.4 on (04.06.23) 10.4 on (01.23.23) - BCVA 20/30 OU decreased from 20/25 OU - FA 10.20.23 shows 360 midzonal NVE w/ leakage OU - exam today shows new preretinal heme OS -- temporal mac - OCT OD: Mild persistent IRF / IRHM-- greatest temp macula +PRF overlying disc and inferior macula; OS: Mild cystic changes inferior macula-- Improved, pre retinal fibrosis overlying disc extending to macula w/ mild traction, new pre retinal hyper reflective material ST macula - R/B/A of injections in the left eye were discussed, recommend holding at this time since the blood sugars are improving. If at return visit the bleeding is still there or worse will recommend beginning injections. - f/u 2-3 weeks -- DFE/OCT, possible injection  3,4. History of high risk pregnancy w/ +HTN and hypertensive retinopathy  - s/p Cesarean 06.24.23 (baby boy) - 33 weeks and 1 day, 3lbs, now out of the NICU and at home - history of pre-eclampsia - BP improved since delivery - discussed importance of tight BP control  - continue to monitor   Ophthalmic Meds Ordered this visit:  No orders of the defined types were placed in this encounter.    Return in about 2 weeks (around 10/23/2022) for f/u PDR w/ DME OU, DFE, OCT, Possible, IVA, OS.  There are no Patient Instructions on file for this visit.  Explained the diagnoses, plan, and follow up with the patient  and they expressed understanding.  Patient expressed understanding of the importance of proper follow up care.   This document serves as a record  of services personally performed by Gardiner Sleeper, MD, PhD. It was created on their behalf by San Jetty. Owens Shark, OA an ophthalmic technician. The creation of this record is the provider's dictation and/or activities during the visit.    Electronically signed by: San Jetty. Owens Shark, [place] 11.13.2023 8:30 PM  This document serves as a record of services personally performed by Gardiner Sleeper, MD, PhD. It was created on their behalf by Renaldo Reel, [place] an ophthalmic technician. The creation of this record is the provider's dictation and/or activities during the visit.    Electronically signed by:  Renaldo Reel, COT  11.17.23 8:30 PM   Gardiner Sleeper, M.D., Ph.D. Diseases & Surgery of the Retina and Vitreous [facility]  I have reviewed the above documentation for accuracy and completeness, and I agree with the above. Gardiner Sleeper, M.D., Ph.D. 10/09/22 8:33 PM   Abbreviations: M myopia (nearsighted); A astigmatism; H hyperopia (farsighted); P presbyopia; Mrx spectacle prescription;  CTL contact lenses; OD right eye; OS left eye; OU both eyes  XT exotropia; ET esotropia; PEK punctate epithelial keratitis; PEE punctate epithelial erosions; DES dry eye syndrome; MGD meibomian gland dysfunction; ATs artificial tears; PFAT's preservative free artificial tears; [place] nuclear sclerotic cataract; PSC posterior subcapsular cataract; ERM epi-retinal membrane; PVD posterior vitreous detachment; RD retinal detachment; DM diabetes mellitus; DR diabetic retinopathy; NPDR non-proliferative diabetic retinopathy; PDR proliferative diabetic retinopathy; CSME clinically significant macular edema; DME diabetic macular edema; dbh dot blot hemorrhages; CWS cotton wool spot; POAG primary open angle glaucoma; C/D cup-to-disc ratio; HVF humphrey visual field; GVF goldmann visual field; OCT optical coherence tomography; IOP intraocular pressure; BRVO Branch retinal vein occlusion; CRVO central  retinal vein occlusion; CRAO central retinal artery occlusion; BRAO branch retinal artery occlusion; RT retinal tear; SB scleral buckle; PPV pars plana vitrectomy; VH Vitreous hemorrhage; PRP panretinal laser photocoagulation; IVK intravitreal kenalog; VMT vitreomacular traction; MH Macular hole;  NVD neovascularization of the disc; NVE neovascularization elsewhere; AREDS age related eye disease study; ARMD age related macular degeneration; POAG primary open angle glaucoma; EBMD epithelial/anterior basement membrane dystrophy; ACIOL anterior chamber intraocular lens; IOL intraocular lens; PCIOL posterior chamber intraocular lens; Phaco/IOL phacoemulsification with intraocular lens placement; Cross photorefractive keratectomy; LASIK laser assisted in situ keratomileusis; HTN hypertension; DM diabetes mellitus; COPD chronic obstructive pulmonary disease

## 2022-10-09 ENCOUNTER — Ambulatory Visit (INDEPENDENT_AMBULATORY_CARE_PROVIDER_SITE_OTHER): Payer: Medicaid Other | Admitting: Ophthalmology

## 2022-10-09 ENCOUNTER — Encounter (INDEPENDENT_AMBULATORY_CARE_PROVIDER_SITE_OTHER): Payer: Self-pay | Admitting: Ophthalmology

## 2022-10-09 DIAGNOSIS — I10 Essential (primary) hypertension: Secondary | ICD-10-CM | POA: Diagnosis not present

## 2022-10-09 DIAGNOSIS — H35373 Puckering of macula, bilateral: Secondary | ICD-10-CM | POA: Diagnosis not present

## 2022-10-09 DIAGNOSIS — H35033 Hypertensive retinopathy, bilateral: Secondary | ICD-10-CM

## 2022-10-09 DIAGNOSIS — E103513 Type 1 diabetes mellitus with proliferative diabetic retinopathy with macular edema, bilateral: Secondary | ICD-10-CM

## 2022-10-12 ENCOUNTER — Encounter: Payer: Medicaid Other | Attending: Family Medicine | Admitting: Nutrition

## 2022-10-12 DIAGNOSIS — E1065 Type 1 diabetes mellitus with hyperglycemia: Secondary | ICD-10-CM | POA: Insufficient documentation

## 2022-10-12 DIAGNOSIS — E1042 Type 1 diabetes mellitus with diabetic polyneuropathy: Secondary | ICD-10-CM | POA: Insufficient documentation

## 2022-10-12 NOTE — Progress Notes (Signed)
Patient is here today with her translator, and with her OmniPOd and a Dexcom transmitter, but no sensors.    [facility] called and they did not give them to her.  She will pick them up and return tomorrow to start the Dexcom, and an appointment was made for next week for pump training.  Her phone does not support the app, and a receiver was given to her.  It was set with date/time. Patient reports that she is not pregnant and denies miscarriage.  She will return tomorrow for me to train her on the use of the Dexcom

## 2022-10-12 NOTE — Patient Instructions (Signed)
Go by drug store to pick up the Dexcom sensors Return tomorrow at 11:45 AM  for [facility] training

## 2022-10-13 ENCOUNTER — Encounter: Payer: Medicaid Other | Admitting: Nutrition

## 2022-10-13 DIAGNOSIS — E1042 Type 1 diabetes mellitus with diabetic polyneuropathy: Secondary | ICD-10-CM

## 2022-10-13 NOTE — Progress Notes (Signed)
Patient is here with a interpreter.  She was trained on the Dexcom G6 sensor and transmitter usage.  Readings are going to a reader, since her phone is not able to support the G6 app. A sensor was inserted into her right upper outer arm, and transmitter was attached.  This was linked to her reader.  Questions were answered about appropriate drinks and we discussed the idea of a balanced meal with protein, carbs and fat in each meal.  We discussed foods that are carbohydrate foods like fruit, milks and starches.  Discussed need to limit fruits to 1 serving 2-3 times/day and what the serving sizes are for fruits and milk portions. She had no final questions.

## 2022-10-13 NOTE — Patient Instructions (Signed)
Call 800 number for manual in arabic. Call 800 number if questions, or if sensor falls off Apply a new sensor every 10 days Apply and new transmitter every 3 months.

## 2022-10-21 ENCOUNTER — Encounter: Payer: Medicaid Other | Admitting: Nutrition

## 2022-10-21 DIAGNOSIS — E1065 Type 1 diabetes mellitus with hyperglycemia: Secondary | ICD-10-CM

## 2022-10-21 NOTE — Patient Instructions (Signed)
Call 800 help line if questions. Call office if blood sugars drop below 70, or remain over 225.   Call if you can get an old I phone or a new android phone to be able to download the Dexcom G6

## 2022-10-21 NOTE — Progress Notes (Signed)
Patient is here with her interpreter and she was trained on the use of the OmniPod 5 insulin pump.  We linked her PDM to [facility] and to [place] [facility].  Her Dexcom G6 was not able to be linked to her phone, so it will not be linked to the pod. She was reminded that when she gets a new phone to download the G6 app and call me.  She agreed to do this. She was trained on how to fill a pod, apply the pod and use it to deliver a meal and correction bolus.  She filled the pod with Novolog insulin and it was applied to her right abdomen without difficulty.  She was trained on the need to rotate sites appropriately and how/where to apply the pods She re demonstrated how to give a meal bolus with written instructions to give 4u for meals.  We discussed the need to give more/less insulin when meal size and carb size varies.  She was also shown how to give a correction dose and told to do this when her blood sugar was ever over 225.  She reported good understanding of this.  We reviewed alerts and alarms and she was shown how to respond to theses.  We discussed the need to download google translate to view her manual in arabic.  She agreed to do this.   [employer] called to see if they have the manual in arabic, but was not able to get through.   We reviewed what IOB is , and all other topics on the training checklist.  She signed the checklist as understanding all topics with no final questions.  She was told  to call the office if blood sugars drop below 70, or remain over 225.  She agreed to do this.

## 2022-10-26 NOTE — Progress Notes (Signed)
[facility] Note  10/30/2022     CHIEF COMPLAINT Patient presents for Retina Follow Up   HISTORY OF PRESENT ILLNESS: Kristin Clay is a 36 y.o. female who presents to the clinic today for:   HPI     Retina Follow Up   Patient presents with  Diabetic Retinopathy.  In both eyes.  Severity is moderate.  Duration of 3 weeks.  Since onset it is stable.  I, the attending physician,  performed the HPI with the patient and updated documentation appropriately.        Comments   Pt here for 3 wk ret f/u for PDR OU. Pt states VA in OS is worse. She feels since laser on 10.20.23 she has not seen as well and OS is worse from last visit. OD seems stable. Blood sugar this morning was 225 this morning.       Last edited by Bernarda Caffey, MD on 10/30/2022 12:55 PM.    Pt states left eye vision is blurry, pt states her highest blood sugar has been 260, but she is taking her medicine as directed, she just got an insulin pump   Referring physician: Gevena Cotton MD [address] [place], [place] [address]  HISTORICAL INFORMATION:   Selected notes from the MEDICAL RECORD NUMBER Referred by Dr. Frederico Hamman for diabetic retinal evaluation LEE:  Ocular Hx- PMH-pregnancy, DM    CURRENT MEDICATIONS: No current outpatient medications on file. (Ophthalmic Drugs)   No current facility-administered medications for this visit. (Ophthalmic Drugs)   Current Outpatient Medications (Other)  Medication Sig   acetaminophen (TYLENOL) 500 MG tablet Take 1,000 mg by mouth every 6 (six) hours as needed.   aspirin (ASPIRIN LOW DOSE) 81 MG chewable tablet CHEW 1 TABLET BY MOUTH EVERY DAY   Continuous Blood Gluc Transmit (DEXCOM G6 TRANSMITTER) MISC 1 Device by Does not apply route as directed.   insulin aspart (NOVOLOG FLEXPEN) 100 UNIT/ML FlexPen Inject 5 Units into the skin 3 (three) times daily with meals. Max daily 30 units   insulin glargine (LANTUS)  100 UNIT/ML Solostar Pen Inject 22 Units into the skin at bedtime.   Insulin Pen Needle 32G X 4 MM MISC 1 Device by Does not apply route in the morning, at noon, in the evening, and at bedtime.   labetalol (NORMODYNE) 300 MG tablet Take 1 tablet (300 mg total) by mouth 2 (two) times daily.   lisinopril (ZESTRIL) 10 MG tablet Take 1 tablet (10 mg total) by mouth daily.   NIFEdipine (ADALAT CC) 60 MG 24 hr tablet Take 1 tablet (60 mg total) by mouth 2 (two) times daily.   oxyCODONE-acetaminophen (PERCOCET/ROXICET) 5-325 MG tablet Take 1-2 tablets by mouth every 6 (six) hours as needed.   Prenatal Vit-Fe Fumarate-FA (PRENATAL VITAMIN) 27-0.8 MG TABS Take 1 tablet by mouth daily.   Continuous Blood Gluc Sensor (DEXCOM G6 SENSOR) MISC 1 Device by Does not apply route as directed.   No current facility-administered medications for this visit. (Other)   REVIEW OF SYSTEMS: ROS   Positive for: Endocrine, Eyes Negative for: Constitutional, Gastrointestinal, Neurological, Skin, Genitourinary, Musculoskeletal, HENT, Cardiovascular, Respiratory, Psychiatric, Allergic/Imm, Heme/Lymph Last edited by Kingsley Spittle, COT on 10/30/2022  9:27 AM.     ALLERGIES Allergies  Allergen Reactions   Pork-Derived Products    PAST MEDICAL HISTORY Past Medical History:  Diagnosis Date   Chronic hypertension    Complication of anesthesia  with hand surgery, local anes did not work, tried twice- had to put her to sleep   Diabetic retinopathy ([place])    DKA (diabetic ketoacidosis) ([place]) 10/2020   Epidural hematoma ([place]) 07/04/2021   Obstetric vaginal laceration with type 3c third degree perineal laceration 06/29/2021   Pyelonephritis 10/2020   Rh negative status during pregnancy 05/01/2021   s/p Rhogam 05/13/21   Type I diabetes mellitus ([place])    dx 52yr ago   UTI (urinary tract infection)    Past Surgical History:  Procedure Laterality Date   CESAREAN SECTION  05/16/2022   Procedure: CESAREAN SECTION;   Surgeon: BGriffin Basil MD;  Location: [facility] LD ORS;  Service: Obstetrics;;   female circumcision Bilateral    clitorectomy as well   FOOT SURGERY  2018   HAND SURGERY  2019   FAMILY HISTORY Family History  Problem Relation Age of Onset   Hyperlipidemia Mother    Hypertension Mother    Kidney disease Father    Alzheimer's disease Father    SOCIAL HISTORY Social History   Tobacco Use   Smoking status: Never   Smokeless tobacco: Never  Vaping Use   Vaping Use: Never used  Substance Use Topics   Alcohol use: Never   Drug use: Never       OPHTHALMIC EXAM:  Base Eye Exam     Visual Acuity (Snellen - Linear)       Right Left   Dist [place] 20/40 -2 20/25 -1   Dist ph [place] 20/30 -1          Tonometry (Tonopen, 9:33 AM)       Right Left   Pressure 17 17         Pupils       Pupils Dark Light Shape React APD   Right PERRL 3 2 Round Minimal None   Left PERRL 3 2 Round Minimal None         Visual Fields (Counting fingers)       Left Right    Full Full         Extraocular Movement       Right Left    Full, Ortho Full, Ortho         Neuro/Psych     Oriented x3: Yes   Mood/Affect: Normal         Dilation     Both eyes: 1.0% Mydriacyl, 2.5% Phenylephrine @ 9:34 AM           Slit Lamp and Fundus Exam     Slit Lamp Exam       Right Left   Lids/Lashes normal normal   Conjunctiva/Sclera mild melanosis, Trace Injection, +corkscrew vessels nasally mild melanosis   Cornea trace PEE Clear   Anterior Chamber deep, clear, narrow temporal angle deep and quiet   Iris round and dilated, no NVI round and dilated, no NVI   Lens 1+Cortical changes Clear   Anterior Vitreous syneresis syneresis         Fundus Exam       Right Left   Disc pink and sharp; compact; mild hyperemia; +fibrosis, fine NVD -- regressing; +PPP pink and sharp; compact; +fibrosis w/ fine NVD   C/D Ratio 0.2 0.2   Macula good foveal reflex, scattered MA/DBH, exudates, +CWS,  fine NVE inferior and temporal macula - regressing, ERM with striae nasally good foveal reflex, ERM with striae, scattered MA/DBH greatest temporal macula, exudates temporally, scattered fibrosis and NV, preretinal heme  superior macula   Vessels attenuated, Tortuous, early NVE greatest inferior arcades -- ?regressing, Copper wiring attenuated, copper wiring, +NVE -- scattered, +fibrosis   Periphery attached, 360 MA/DBH, good 360 PRP laser changes and posterior fill in -- some room for fill in, scattered NV -- regressing attached, 360 MA/DBH, good 360 PRP with room for fill in, scattered NV           IMAGING AND PROCEDURES  Imaging and Procedures for 10/30/2022  OCT, Retina - OU - Both Eyes       Right Eye Quality was good. Central Foveal Thickness: 265. Progression has improved. Findings include normal foveal contour, no SRF, intraretinal hyper-reflective material, intraretinal fluid, vitreomacular adhesion , preretinal fibrosis (Mild interval improvement in in cystic changes greatest IT macula, +PRF overlying disc and inferior macula).   Left Eye Quality was good. Central Foveal Thickness: 274. Progression has improved. Findings include normal foveal contour, no IRF, no SRF, epiretinal membrane, macular pucker, vitreous traction, preretinal fibrosis (Mild cystic changes inferior macula, pre retinal fibrosis overlying disc extending to macula w/ mild traction, pre retinal hyper reflective material ST macula -- improving).   Notes *Images captured and stored on drive  Diagnosis / Impression:  PDR w/ DME OU OD: Mild interval improvement in in cystic changes greatest IT macula, +PRF overlying disc and inferior macula OS: Mild cystic changes inferior macula, pre retinal fibrosis overlying disc extending to macula w/ mild traction, pre retinal hyper reflective material ST macula -- improving  Clinical management:  See below  Abbreviations: NFP - Normal foveal profile. CME - cystoid macular  edema. PED - pigment epithelial detachment. IRF - intraretinal fluid. SRF - subretinal fluid. EZ - ellipsoid zone. ERM - epiretinal membrane. ORA - outer retinal atrophy. ORT - outer retinal tubulation. SRHM - subretinal hyper-reflective material. IRHM - intraretinal hyper-reflective material      Intravitreal Injection, Pharmacologic Agent - OS - Left Eye       Time Out 10/30/2022. 11:17 AM. Confirmed correct patient, procedure, site, and patient consented.   Anesthesia Topical anesthesia was used. Anesthetic medications included Lidocaine 4%, Proparacaine 0.5%.   Procedure Preparation included 5% betadine to ocular surface, eyelid speculum. A supplied needle was used.   Injection: 1.25 mg Bevacizumab 1.69m/0.05ml   Route: Intravitreal, Site: Left Eye   NDC:: 18299-371-69 Lot: [id number] , Expiration date: 12/09/2022   Post-op Post injection exam found visual acuity of at least counting fingers. The patient tolerated the procedure well. There were no complications. The patient received written and verbal post procedure care education. Post injection medications were not given.            ASSESSMENT/PLAN:    ICD-10-CM   1. 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 Epiretinal membrane (ERM) of both eyes  H35.373     3. Essential hypertension  I10     4. Hypertensive retinopathy of both eyes  H35.033     5. High-risk pregnancy in second trimester  O09.92      1,2. 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, both eyes  - s/p PRP OD (03.27.23), fill in (05.15.23)  - s/p PRP OS (04.17.23), fill in (10.20.23) - last  A1c 14.5 on (09/01/22), 7.4 on (06.06.23), 8.4 on (04.06.23) 10.4  on (01.23.23) - BCVA 20/30 OU decreased from 20/25 OU - FA 10.20.23 shows 360 midzonal NVE w/ leakage OU - exam today shows worsening preretinal heme OS -- temporal mac - OCT shows OD: Mild interval improvement in in cystic changes greatest IT macula, +PRF overlying disc and inferior macula; OS: Mild cystic changes inferior macula, pre retinal fibrosis overlying disc extending to macula w/ mild traction, pre retinal hyper reflective material ST macula -- improving - recommend IVA OS #1 today, 12.08.23 for increasing heme and DME - pt wishes to proceed with injection - RBA of procedure discussed, questions answered - IVA informed consent obtained and signed, 12.08.23 (OU) - see procedure note - f/u 4 weeks -- DFE/OCT, possible injection  3-5. History of high risk pregnancy w/ +HTN and hypertensive retinopathy  - s/p Cesarean 06.24.23 (baby boy) - 33 weeks and 1 day, 3lbs, now out of the NICU and at home - history of pre-eclampsia - BP improved since delivery - discussed importance of tight BP control  - continue to monitor   Ophthalmic Meds Ordered this visit:  Meds ordered this encounter  Medications   Bevacizumab (AVASTIN) SOLN 1.25 mg     Return in about 4 weeks (around 11/27/2022) for f/u PDR OU, DFE, OCT.  There are no Patient Instructions on file for this visit.  Explained the diagnoses, plan, and follow up with the patient and they expressed understanding.  Patient expressed understanding of the importance of proper follow up care.   This document serves as a record of services personally performed by Gardiner Sleeper, MD, PhD. It was created on their behalf by San Jetty. Owens Shark, OA an ophthalmic technician. The creation of this record is the provider's dictation and/or activities during the visit.    Electronically signed by: San Jetty. Owens Shark, [place] 12.04.2023 2:09 AM  Gardiner Sleeper, M.D., Ph.D. Diseases & Surgery of the Retina and Vitreous [facility]  I  have reviewed the above documentation for accuracy and completeness, and I agree with the above. Gardiner Sleeper, M.D., Ph.D. 11/02/22 2:10 AM   Abbreviations: M myopia (nearsighted); A astigmatism; H hyperopia (farsighted); P presbyopia; Mrx spectacle prescription;  CTL contact lenses; OD right eye; OS left eye; OU both eyes  XT exotropia; ET esotropia; PEK punctate epithelial keratitis; PEE punctate epithelial erosions; DES dry eye syndrome; MGD meibomian gland dysfunction; ATs artificial tears; PFAT's preservative free artificial tears; [place] nuclear sclerotic cataract; PSC posterior subcapsular cataract; ERM epi-retinal membrane; PVD posterior vitreous detachment; RD retinal detachment; DM diabetes mellitus; DR diabetic retinopathy; NPDR non-proliferative diabetic retinopathy; PDR proliferative diabetic retinopathy; CSME clinically significant macular edema; DME diabetic macular edema; dbh dot blot hemorrhages; CWS cotton wool spot; POAG primary open angle glaucoma; C/D cup-to-disc ratio; HVF humphrey visual field; GVF goldmann visual field; OCT optical coherence tomography; IOP intraocular pressure; BRVO Branch retinal vein occlusion; CRVO central retinal vein occlusion; CRAO central retinal artery occlusion; BRAO branch retinal artery occlusion; RT retinal tear; SB scleral buckle; PPV pars plana vitrectomy; VH Vitreous hemorrhage; PRP panretinal laser photocoagulation; IVK intravitreal kenalog; VMT vitreomacular traction; MH Macular hole;  NVD neovascularization of the disc; NVE neovascularization elsewhere; AREDS age related eye disease study; ARMD age related macular degeneration; POAG primary open angle glaucoma; EBMD epithelial/anterior basement membrane dystrophy; ACIOL anterior chamber intraocular lens; IOL intraocular lens; PCIOL posterior chamber intraocular lens; Phaco/IOL phacoemulsification with intraocular lens placement; [place] photorefractive keratectomy;  LASIK laser assisted in situ  keratomileusis; HTN hypertension; DM diabetes mellitus; COPD chronic obstructive pulmonary disease

## 2022-10-28 ENCOUNTER — Encounter: Payer: Medicaid Other | Attending: Family Medicine | Admitting: Nutrition

## 2022-10-28 ENCOUNTER — Ambulatory Visit: Payer: Medicaid Other | Admitting: Nutrition

## 2022-10-28 DIAGNOSIS — E1165 Type 2 diabetes mellitus with hyperglycemia: Secondary | ICD-10-CM | POA: Insufficient documentation

## 2022-10-28 NOTE — Progress Notes (Signed)
Patient reports no difficulty giving boluses and reports that she is putting blood sugar readings into each meal time bolus as well as 4 carbs (units) into the meal portion.  Dexcom download shows 34% time in range with 1% low, 38% high and 27% very high.  Patient reports eating "no sweets, eating only small amounts of carbs at every meal".   Since phone is not compatible with dexcom downoad, we are not able to download PDM.   Dexcom download shown to Dr. Lonzo Cloud and boluses were increased to 6 per meal.  Patient verbalized understanding that she will be putting in 6 in carb box and continuing to add blood sugar readings.   She is not doing correction boluses as encouraged when she was trained last week.  We reviewed the need to do this when blood sugar readings are over 250.  She verbalized understanding of this with her interpreter.  She redemonstrated how to do a correction dose, by leaving carb box blank, and just putting in blood sugar readings into the bolus calculator.   She reports no difficulty with alerts and alarms, saying she shows them to her husband who is able to tell her what it says.   She had no final questions for me a this time. Patient given a list of compatible [employer] phones for the dexcom  G6 app  Front desk notified of Dr. Harvel Ricks orders to get her in sooner than her next schedule appt. On 01/08/23.

## 2022-10-28 NOTE — Patient Instructions (Signed)
Do 6 carbs at each meal Do a correction dose when blood sugars are over 250.

## 2022-10-30 ENCOUNTER — Other Ambulatory Visit: Payer: Self-pay

## 2022-10-30 ENCOUNTER — Encounter (INDEPENDENT_AMBULATORY_CARE_PROVIDER_SITE_OTHER): Payer: Self-pay | Admitting: Ophthalmology

## 2022-10-30 ENCOUNTER — Ambulatory Visit (INDEPENDENT_AMBULATORY_CARE_PROVIDER_SITE_OTHER): Payer: Medicaid Other | Admitting: Ophthalmology

## 2022-10-30 DIAGNOSIS — H35033 Hypertensive retinopathy, bilateral: Secondary | ICD-10-CM | POA: Diagnosis not present

## 2022-10-30 DIAGNOSIS — E103513 Type 1 diabetes mellitus with proliferative diabetic retinopathy with macular edema, bilateral: Secondary | ICD-10-CM

## 2022-10-30 DIAGNOSIS — I10 Essential (primary) hypertension: Secondary | ICD-10-CM

## 2022-10-30 DIAGNOSIS — H35373 Puckering of macula, bilateral: Secondary | ICD-10-CM | POA: Diagnosis not present

## 2022-10-30 DIAGNOSIS — O0992 Supervision of high risk pregnancy, unspecified, second trimester: Secondary | ICD-10-CM

## 2022-10-30 MED ORDER — BEVACIZUMAB CHEMO INJECTION 1.25MG/0.05ML SYRINGE FOR KALEIDOSCOPE
1.2500 mg | INTRAVITREAL | Status: AC | PRN
  Administered 2022-10-30: 1.25 mg via INTRAVITREAL

## 2022-10-30 MED ORDER — DEXCOM G6 SENSOR MISC: 1.0000 | 2 refills | Status: DC

## 2022-11-09 ENCOUNTER — Encounter: Payer: Self-pay | Admitting: Internal Medicine

## 2022-11-27 ENCOUNTER — Encounter (INDEPENDENT_AMBULATORY_CARE_PROVIDER_SITE_OTHER): Payer: Medicaid Other | Admitting: Ophthalmology

## 2022-11-27 ENCOUNTER — Ambulatory Visit: Payer: Medicaid Other | Admitting: Internal Medicine

## 2022-11-30 ENCOUNTER — Ambulatory Visit (INDEPENDENT_AMBULATORY_CARE_PROVIDER_SITE_OTHER): Payer: Medicaid Other | Admitting: Ophthalmology

## 2022-11-30 ENCOUNTER — Encounter (INDEPENDENT_AMBULATORY_CARE_PROVIDER_SITE_OTHER): Payer: Self-pay | Admitting: Ophthalmology

## 2022-11-30 DIAGNOSIS — H35373 Puckering of macula, bilateral: Secondary | ICD-10-CM | POA: Diagnosis not present

## 2022-11-30 DIAGNOSIS — I10 Essential (primary) hypertension: Secondary | ICD-10-CM | POA: Diagnosis not present

## 2022-11-30 DIAGNOSIS — H35033 Hypertensive retinopathy, bilateral: Secondary | ICD-10-CM | POA: Diagnosis not present

## 2022-11-30 DIAGNOSIS — E103513 Type 1 diabetes mellitus with proliferative diabetic retinopathy with macular edema, bilateral: Secondary | ICD-10-CM | POA: Diagnosis not present

## 2022-11-30 MED ORDER — BEVACIZUMAB CHEMO INJECTION 1.25MG/0.05ML SYRINGE FOR KALEIDOSCOPE
1.2500 mg | INTRAVITREAL | Status: AC | PRN
  Administered 2022-11-30: 1.25 mg via INTRAVITREAL

## 2022-11-30 NOTE — Progress Notes (Signed)
[facility] Note  11/30/2022     CHIEF COMPLAINT Patient presents for Retina Follow Up   HISTORY OF PRESENT ILLNESS: Kristin Clay is a 37 y.o. female who presents to the clinic today for:   HPI     Retina Follow Up   Patient presents with  Diabetic Retinopathy.  In both eyes.  This started 4 weeks ago.  I, the attending physician,  performed the HPI with the patient and updated documentation appropriately.        Comments   Patient here for 4 weeks retina follow up for PDR OU. Patient states vision doing good. No eye pain.      Last edited by Bernarda Caffey, MD on 11/30/2022 11:55 AM.    Pt states vision has improved, no problems after first injection  Referring physician: Gevena Cotton MD [address], [address] [place], [place] [address]  HISTORICAL INFORMATION:   Selected notes from the MEDICAL RECORD NUMBER Referred by Dr. Frederico Hamman for diabetic retinal evaluation LEE:  Ocular Hx- PMH-pregnancy, DM    CURRENT MEDICATIONS: No current outpatient medications on file. (Ophthalmic Drugs)   No current facility-administered medications for this visit. (Ophthalmic Drugs)   Current Outpatient Medications (Other)  Medication Sig   acetaminophen (TYLENOL) 500 MG tablet Take 1,000 mg by mouth every 6 (six) hours as needed.   aspirin (ASPIRIN LOW DOSE) 81 MG chewable tablet CHEW 1 TABLET BY MOUTH EVERY DAY   Continuous Blood Gluc Sensor (DEXCOM G6 SENSOR) MISC 1 Device by Does not apply route as directed.   Continuous Blood Gluc Transmit (DEXCOM G6 TRANSMITTER) MISC 1 Device by Does not apply route as directed.   insulin aspart (NOVOLOG FLEXPEN) 100 UNIT/ML FlexPen Inject 5 Units into the skin 3 (three) times daily with meals. Max daily 30 units   insulin glargine (LANTUS) 100 UNIT/ML Solostar Pen Inject 22 Units into the skin at bedtime.   Insulin Pen Needle 32G X 4 MM MISC 1 Device by Does not apply route in the morning, at noon,  in the evening, and at bedtime.   labetalol (NORMODYNE) 300 MG tablet Take 1 tablet (300 mg total) by mouth 2 (two) times daily.   lisinopril (ZESTRIL) 10 MG tablet Take 1 tablet (10 mg total) by mouth daily.   NIFEdipine (ADALAT CC) 60 MG 24 hr tablet Take 1 tablet (60 mg total) by mouth 2 (two) times daily.   oxyCODONE-acetaminophen (PERCOCET/ROXICET) 5-325 MG tablet Take 1-2 tablets by mouth every 6 (six) hours as needed.   Prenatal Vit-Fe Fumarate-FA (PRENATAL VITAMIN) 27-0.8 MG TABS Take 1 tablet by mouth daily.   No current facility-administered medications for this visit. (Other)   REVIEW OF SYSTEMS: ROS   Positive for: Endocrine, Eyes Negative for: Constitutional, Gastrointestinal, Neurological, Skin, Genitourinary, Musculoskeletal, HENT, Cardiovascular, Respiratory, Psychiatric, Allergic/Imm, Heme/Lymph Last edited by Theodore Demark, COA on 11/30/2022 10:12 AM.     ALLERGIES Allergies  Allergen Reactions   Pork-Derived Products    PAST MEDICAL HISTORY Past Medical History:  Diagnosis Date   Chronic hypertension    Complication of anesthesia    with hand surgery, local anes did not work, tried twice- had to put her to sleep   Diabetic retinopathy ([place])    DKA (diabetic ketoacidosis) ([place]) 10/2020   Epidural hematoma ([place]) 07/04/2021   Obstetric vaginal laceration with type 3c third degree perineal laceration 06/29/2021   Pyelonephritis 10/2020   Rh negative status during pregnancy 05/01/2021  s/p Rhogam 05/13/21   Type I diabetes mellitus (HCC)    dx 60yr ago   UTI (urinary tract infection)    Past Surgical History:  Procedure Laterality Date   CESAREAN SECTION  05/16/2022   Procedure: CESAREAN SECTION;  Surgeon: BGriffin Basil MD;  [facility]: [facility];  Service: Obstetrics;;   female circumcision Bilateral    clitorectomy as well   FOOT SURGERY  2018   HAND SURGERY  2019   FAMILY HISTORY Family History  Problem Relation Age of Onset   Hyperlipidemia  Mother    Hypertension Mother    Kidney disease Father    Alzheimer's disease Father    SOCIAL HISTORY Social History   Tobacco Use   Smoking status: Never   Smokeless tobacco: Never  Vaping Use   Vaping Use: Never used  Substance Use Topics   Alcohol use: Never   Drug use: Never       OPHTHALMIC EXAM:  Base Eye Exam     Visual Acuity (Snellen - Linear)       Right Left   Dist [place] 20/25 +1 20/20 -1   Dist ph [place] NI          Tonometry (Tonopen, 10:10 AM)       Right Left   Pressure 19 21         Pupils       Dark Light Shape React APD   Right 3 2 Round Brisk None   Left 3 2 Round Brisk None         Visual Fields (Counting fingers)       Left Right    Full Full         Extraocular Movement       Right Left    Full, Ortho Full, Ortho         Neuro/Psych     Oriented x3: Yes   Mood/Affect: Normal         Dilation     Both eyes: 1.0% Mydriacyl, 2.5% Phenylephrine @ 10:10 AM           Slit Lamp and Fundus Exam     Slit Lamp Exam       Right Left   Lids/Lashes normal normal   Conjunctiva/Sclera mild melanosis, Trace Injection, +corkscrew vessels nasally mild melanosis   Cornea trace PEE Clear   Anterior Chamber deep, clear, narrow temporal angle deep and quiet   Iris round and dilated, no NVI round and dilated, no NVI   Lens 1+Cortical changes Clear   Anterior Vitreous syneresis syneresis         Fundus Exam       Right Left   Disc pink and sharp; compact; mild hyperemia; +fibrosis, fine NVD -- regressing; +PPP pink and sharp; compact; +fibrosis w/ fine NVD -- regressing   C/D Ratio 0.2 0.2   Macula good foveal reflex, scattered MA/DBH, exudates, +CWS, fine NVE inferior and temporal macula - regressing, ERM with striae nasally good foveal reflex, ERM with striae, scattered MA/DBH greatest temporal macula -- improving, exudates temporally, scattered fibrosis and NV, preretinal heme superior macula -- improving and shifting  inferiorly   Vessels attenuated, Tortuous, early NVE greatest inferior arcades -- ?regressing, Copper wiring attenuated, copper wiring, +NVE -- scattered, +fibrosis, Tortuous   Periphery attached, 360 MA/DBH, good 360 PRP laser changes and posterior fill in -- some room for fill in, scattered NV -- regressing attached, 360 MA/DBH, good 360 PRP with room  for fill in, scattered NV -- regressing           IMAGING AND PROCEDURES  Imaging and Procedures for 11/30/2022  OCT, Retina - OU - Both Eyes       Right Eye Quality was good. Central Foveal Thickness: 266. Progression has improved. Findings include normal foveal contour, no SRF, intraretinal hyper-reflective material, intraretinal fluid, vitreomacular adhesion , preretinal fibrosis (Mild interval improvement in in cystic changes greatest IT macula, +PRF overlying disc and inferior macula).   Left Eye Quality was good. Central Foveal Thickness: 266. Progression has improved. Findings include normal foveal contour, no IRF, no SRF, epiretinal membrane, macular pucker, vitreous traction, preretinal fibrosis (Mild cystic changes inferior macula -- improved, pre retinal fibrosis overlying disc extending to macula w/ mild traction, pre retinal hyper reflective material ST macula -- improved).   Notes *Images captured and stored on drive  Diagnosis / Impression:  PDR w/ DME OU OD: Mild interval improvement in in cystic changes greatest IT macula, +PRF overlying disc and inferior macula OS: Mild cystic changes inferior macula -- improved, pre retinal fibrosis overlying disc extending to macula w/ mild traction, pre retinal hyper reflective material ST macula -- improved  Clinical management:  See below  Abbreviations: NFP - Normal foveal profile. CME - cystoid macular edema. PED - pigment epithelial detachment. IRF - intraretinal fluid. SRF - subretinal fluid. EZ - ellipsoid zone. ERM - epiretinal membrane. ORA - outer retinal atrophy. ORT -  outer retinal tubulation. SRHM - subretinal hyper-reflective material. IRHM - intraretinal hyper-reflective material      Intravitreal Injection, Pharmacologic Agent - OS - Left Eye       Time Out 11/30/2022. 11:13 AM. Confirmed correct patient, procedure, site, and patient consented.   Anesthesia Topical anesthesia was used. Anesthetic medications included Lidocaine 4%, Proparacaine 0.5%.   Procedure Preparation included 5% betadine to ocular surface, eyelid speculum. A supplied needle was used.   Injection: 1.25 mg Bevacizumab 1.32m/0.05ml   Route: Intravitreal, Site: Left Eye   NDC:: 76226-333-54 Lot:: [id number] Expiration date: 12/22/2022   Post-op Post injection exam found visual acuity of at least counting fingers. The patient tolerated the procedure well. There were no complications. The patient received written and verbal post procedure care education. Post injection medications were not given.             ASSESSMENT/PLAN:    ICD-10-CM   1. Proliferative diabetic retinopathy of both eyes with macular edema associated with type 1 diabetes mellitus (HCC)  E10.3513 OCT, Retina - OU - Both Eyes    Intravitreal Injection, Pharmacologic Agent - OS - Left Eye    Bevacizumab (AVASTIN) SOLN 1.25 mg    2. Epiretinal membrane (ERM) of both eyes  H35.373     3. Essential hypertension  I10     4. Hypertensive retinopathy of both eyes  H35.033      1,2. 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, both eyes  - s/p PRP OD (03.27.23), fill in (05.15.23)  - s/p PRP OS (04.17.23), fill in (10.20.23)  - s/p IVA OS #1 (12.08.23) - last A1c 14.5 on (09/01/22), 7.4 on (06.06.23), 8.4 on (04.06.23) 10.4 on (01.23.23) - BCVA improved OU: 20/25 OD, 20/20 OS - FA 10.20.23 shows 360 midzonal NVE w/ leakage OU - exam today shows worsening preretinal heme OS -- temporal mac - OCT shows OD: Mild interval improvement in in cystic changes greatest IT macula, +PRF overlying disc and  inferior macula; OS: Mild cystic changes  inferior macula, pre retinal fibrosis overlying disc extending to macula w/ mild traction, pre retinal hyper reflective material ST macula -- improving - recommend IVA OS #2 today, 01.08.24 - pt wishes to proceed with injection - RBA of procedure discussed, questions answered - IVA informed consent obtained and signed, 12.08.23 (OU) - see procedure note - f/u 4 weeks -- DFE/OCT, possible injection  3,4. History of high risk pregnancy w/ +HTN and hypertensive retinopathy  - s/p Cesarean 06.24.23 (baby boy) - 33 weeks and 1 day, 3lbs, now out of the NICU and at home - history of pre-eclampsia - BP improved since delivery - discussed importance of tight BP control  - continue to monitor   Ophthalmic Meds Ordered this visit:  Meds ordered this encounter  Medications   Bevacizumab (AVASTIN) SOLN 1.25 mg     Return in about 4 weeks (around 12/28/2022) for f/u PDR OU, DFE, OCT.  There are no Patient Instructions on file for this visit.  Explained the diagnoses, plan, and follow up with the patient and they expressed understanding.  Patient expressed understanding of the importance of proper follow up care.   This document serves as a record of services personally performed by Gardiner Sleeper, MD, PhD. It was created on their behalf by San Jetty. Owens Shark, OA an ophthalmic technician. The creation of this record is the provider's dictation and/or activities during the visit.    Electronically signed by: San Jetty. Owens Shark, [place] 01.08.2024 12:00 PM  Gardiner Sleeper, M.D., Ph.D. Diseases & Surgery of the Retina and Vitreous [facility]  I have reviewed the above documentation for accuracy and completeness, and I agree with the above. Gardiner Sleeper, M.D., Ph.D. 11/30/22 12:00 PM  Abbreviations: M myopia (nearsighted); A astigmatism; H hyperopia (farsighted); P presbyopia; Mrx spectacle prescription;  CTL contact lenses; OD right eye;  OS left eye; OU both eyes  XT exotropia; ET esotropia; PEK punctate epithelial keratitis; PEE punctate epithelial erosions; DES dry eye syndrome; MGD meibomian gland dysfunction; ATs artificial tears; PFAT's preservative free artificial tears; [place] nuclear sclerotic cataract; PSC posterior subcapsular cataract; ERM epi-retinal membrane; PVD posterior vitreous detachment; RD retinal detachment; DM diabetes mellitus; DR diabetic retinopathy; NPDR non-proliferative diabetic retinopathy; PDR proliferative diabetic retinopathy; CSME clinically significant macular edema; DME diabetic macular edema; dbh dot blot hemorrhages; CWS cotton wool spot; POAG primary open angle glaucoma; C/D cup-to-disc ratio; HVF humphrey visual field; GVF goldmann visual field; OCT optical coherence tomography; IOP intraocular pressure; BRVO Branch retinal vein occlusion; CRVO central retinal vein occlusion; CRAO central retinal artery occlusion; BRAO branch retinal artery occlusion; RT retinal tear; SB scleral buckle; PPV pars plana vitrectomy; VH Vitreous hemorrhage; PRP panretinal laser photocoagulation; IVK intravitreal kenalog; VMT vitreomacular traction; MH Macular hole;  NVD neovascularization of the disc; NVE neovascularization elsewhere; AREDS age related eye disease study; ARMD age related macular degeneration; POAG primary open angle glaucoma; EBMD epithelial/anterior basement membrane dystrophy; ACIOL anterior chamber intraocular lens; IOL intraocular lens; PCIOL posterior chamber intraocular lens; Phaco/IOL phacoemulsification with intraocular lens placement; [place] photorefractive keratectomy; LASIK laser assisted in situ keratomileusis; HTN hypertension; DM diabetes mellitus; COPD chronic obstructive pulmonary disease

## 2022-12-16 ENCOUNTER — Ambulatory Visit (INDEPENDENT_AMBULATORY_CARE_PROVIDER_SITE_OTHER): Payer: Medicaid Other | Admitting: Family Medicine

## 2022-12-16 VITALS — BP 130/84 | HR 100 | Temp 97.7°F | Resp 16 | Wt 128.4 lb

## 2022-12-16 DIAGNOSIS — E11649 Type 2 diabetes mellitus with hypoglycemia without coma: Secondary | ICD-10-CM | POA: Diagnosis not present

## 2022-12-16 DIAGNOSIS — I10 Essential (primary) hypertension: Secondary | ICD-10-CM

## 2022-12-16 DIAGNOSIS — Z789 Other specified health status: Secondary | ICD-10-CM | POA: Diagnosis not present

## 2022-12-16 DIAGNOSIS — M20009 Unspecified deformity of unspecified finger(s): Secondary | ICD-10-CM

## 2022-12-16 DIAGNOSIS — M545 Low back pain, unspecified: Secondary | ICD-10-CM | POA: Diagnosis not present

## 2022-12-16 MED ORDER — TRIAMCINOLONE ACETONIDE 40 MG/ML IJ SUSP
40.0000 mg | Freq: Once | INTRAMUSCULAR | Status: AC
  Administered 2022-12-16: 40 mg via INTRAMUSCULAR

## 2022-12-16 NOTE — Progress Notes (Unsigned)
Due to language barrier, an interpreter was used.  Interpreter name or [place] ID #--Elaf [id number], Reason for encounter--OV.  Rodney Cruise, RMA

## 2022-12-17 ENCOUNTER — Encounter: Payer: Self-pay | Admitting: Family Medicine

## 2022-12-17 NOTE — Progress Notes (Signed)
Established Patient Office Visit  Subjective    Patient ID: Kristin Clay, female    DOB: [birth year]  Age: 37 y.o. MRN: [id number]  CC: No chief complaint on file.   HPI Kristin Clay presents with complaint of finger deformities and low back pain. She reports that she had had some infection son her hands and after they resolved she had deformities of her fingers that are painful and does not allow her to move several of her fingers. She denies any known trauma or injury to her back and the pain is intermittent. This visit was aided by an interpreter   Outpatient Encounter Medications as of 12/16/2022  Medication Sig   Continuous Blood Gluc Sensor (DEXCOM G6 SENSOR) MISC 1 Device by Does not apply route as directed.   Continuous Blood Gluc Transmit (DEXCOM G6 TRANSMITTER) MISC 1 Device by Does not apply route as directed.   insulin aspart (NOVOLOG FLEXPEN) 100 UNIT/ML FlexPen Inject 5 Units into the skin 3 (three) times daily with meals. Max daily 30 units   insulin glargine (LANTUS) 100 UNIT/ML Solostar Pen Inject 22 Units into the skin at bedtime.   Insulin Pen Needle 32G X 4 MM MISC 1 Device by Does not apply route in the morning, at noon, in the evening, and at bedtime.   labetalol (NORMODYNE) 300 MG tablet Take 1 tablet (300 mg total) by mouth 2 (two) times daily.   lisinopril (ZESTRIL) 10 MG tablet Take 1 tablet (10 mg total) by mouth daily.   NIFEdipine (ADALAT CC) 60 MG 24 hr tablet Take 1 tablet (60 mg total) by mouth 2 (two) times daily.   [DISCONTINUED] acetaminophen (TYLENOL) 500 MG tablet Take 1,000 mg by mouth every 6 (six) hours as needed.   [DISCONTINUED] aspirin (ASPIRIN LOW DOSE) 81 MG chewable tablet CHEW 1 TABLET BY MOUTH EVERY DAY   [DISCONTINUED] insulin detemir (LEVEMIR) 100 UNIT/ML injection Inject 20-40 Units into the skin See admin instructions. 40 units every morning and 20 units every night (Patient not taking: Reported on 04/11/2021)    [DISCONTINUED] oxyCODONE-acetaminophen (PERCOCET/ROXICET) 5-325 MG tablet Take 1-2 tablets by mouth every 6 (six) hours as needed.   [DISCONTINUED] Prenatal Vit-Fe Fumarate-FA (PRENATAL VITAMIN) 27-0.8 MG TABS Take 1 tablet by mouth daily.   [EXPIRED] triamcinolone acetonide (KENALOG-40) injection 40 mg    No facility-administered encounter medications on file as of 12/16/2022.    Past Medical History:  Diagnosis Date   Chronic hypertension    Complication of anesthesia    with hand surgery, local anes did not work, tried twice- had to put her to sleep   Diabetic retinopathy ([place])    DKA (diabetic ketoacidosis) ([place]) 10/2020   Epidural hematoma ([place]) 07/04/2021   Obstetric vaginal laceration with type 3c third degree perineal laceration 06/29/2021   Pyelonephritis 10/2020   Rh negative status during pregnancy 05/01/2021   s/p Rhogam 05/13/21   Type I diabetes mellitus ([place])    dx 44yrs ago   UTI (urinary tract infection)     Past Surgical History:  Procedure Laterality Date   CESAREAN SECTION  05/16/2022   Procedure: CESAREAN SECTION;  Surgeon: Griffin Basil, MD;  Location: [facility];  Service: Obstetrics;;   female circumcision Bilateral    clitorectomy as well   FOOT SURGERY  2018   HAND SURGERY  2019    Family History  Problem Relation Age of Onset   Hyperlipidemia Mother    Hypertension Mother  Kidney disease Father    Alzheimer's disease Father     Social History   Socioeconomic History   Marital status: Married    Spouse name: Not on file   Number of children: Not on file   Years of education: Not on file   Highest education level: Not on file  Occupational History   Not on file  Tobacco Use   Smoking status: Never   Smokeless tobacco: Never  Vaping Use   Vaping Use: Never used  Substance and Sexual Activity   Alcohol use: Never   Drug use: Never   Sexual activity: Not Currently    Birth control/protection: None  Other Topics Concern   Not on  file  Social History Narrative   ** Merged History Encounter **       ** Merged History Encounter **       Social Determinants of Health   Financial Resource Strain: High Risk (05/30/2021)   Overall Financial Resource Strain (CARDIA)    Difficulty of Paying Living Expenses: Very hard  Food Insecurity: Food Insecurity Present (03/12/2022)   Hunger Vital Sign    Worried About Running Out of Food in the Last Year: Sometimes true    Ran Out of Food in the Last Year: Sometimes true  Transportation Needs: Unmet Transportation Needs (03/12/2022)   PRAPARE - Hydrologist (Medical): Yes    Lack of Transportation (Non-Medical): Yes  Physical Activity: Not on file  Stress: Not on file  Social Connections: Not on file  Intimate Partner Violence: Not on file    Review of Systems  All other systems reviewed and are negative.       Objective    BP 130/84   Pulse 100   Temp 97.7 F (36.5 C) (Oral)   Resp 16   Wt 128 lb 6.4 oz (58.2 kg)   SpO2 99%   BMI 23.48 kg/m   Physical Exam Vitals and nursing note reviewed.  Constitutional:      General: She is not in acute distress. Cardiovascular:     Rate and Rhythm: Normal rate and regular rhythm.  Pulmonary:     Effort: Pulmonary effort is normal.     Breath sounds: Normal breath sounds.  Musculoskeletal:     Comments: Right hand 3rd digit and left hand thumb and 4th digit is stiff with decrease rom and some distal deformities.   Neurological:     General: No focal deficit present.     Mental Status: She is alert and oriented to person, place, and time.         Assessment & Plan:   1. Uncontrolled type 2 diabetes mellitus with hypoglycemia without coma [facility]) This is being management by endocrinology.  - POCT glycosylated hemoglobin (Hb A1C)  2. Primary hypertension Appears stable. continue  3. Finger deformity Referral to hand surg for further eval/mgt. Kenalog IM injection given -  Ambulatory referral to Hand Surgery - triamcinolone acetonide (KENALOG-40) injection 40 mg  4. Low back pain without sciatica, unspecified back pain laterality, unspecified chronicity Kenalog IM injection given.  - triamcinolone acetonide (KENALOG-40) injection 40 mg  5. Language barrier   Return in about 6 months (around 06/16/2023) for physical.   Becky Sax, MD

## 2022-12-24 NOTE — Progress Notes (Signed)
[facility] Note  12/28/2022     CHIEF COMPLAINT Patient presents for Retina Follow Up   HISTORY OF PRESENT ILLNESS: Kristin Clay is a 37 y.o. female who presents to the clinic today for:   HPI     Retina Follow Up   Patient presents with  Diabetic Retinopathy.  In both eyes.  This started 4 weeks ago.  I, the attending physician,  performed the HPI with the patient and updated documentation appropriately.        Comments   Patient here for 4 weeks retina follow up for PDR OU. Patient states vision doing very good. No eye pain.       Last edited by Bernarda Caffey, MD on 12/28/2022 12:12 PM.     Pt states   Referring physician: Gevena Cotton MD [address] [place], [place] [address]  HISTORICAL INFORMATION:   Selected notes from the MEDICAL RECORD NUMBER Referred by Dr. Frederico Hamman for diabetic retinal evaluation LEE:  Ocular Hx- PMH-pregnancy, DM    CURRENT MEDICATIONS: No current outpatient medications on file. (Ophthalmic Drugs)   No current facility-administered medications for this visit. (Ophthalmic Drugs)   Current Outpatient Medications (Other)  Medication Sig   Continuous Blood Gluc Sensor (DEXCOM G6 SENSOR) MISC 1 Device by Does not apply route as directed.   Continuous Blood Gluc Transmit (DEXCOM G6 TRANSMITTER) MISC 1 Device by Does not apply route as directed.   insulin aspart (NOVOLOG FLEXPEN) 100 UNIT/ML FlexPen Inject 5 Units into the skin 3 (three) times daily with meals. Max daily 30 units   insulin glargine (LANTUS) 100 UNIT/ML Solostar Pen Inject 22 Units into the skin at bedtime.   Insulin Pen Needle 32G X 4 MM MISC 1 Device by Does not apply route in the morning, at noon, in the evening, and at bedtime.   labetalol (NORMODYNE) 300 MG tablet Take 1 tablet (300 mg total) by mouth 2 (two) times daily.   lisinopril (ZESTRIL) 10 MG tablet Take 1 tablet (10 mg total) by mouth daily.   NIFEdipine  (ADALAT CC) 60 MG 24 hr tablet Take 1 tablet (60 mg total) by mouth 2 (two) times daily.   No current facility-administered medications for this visit. (Other)   REVIEW OF SYSTEMS: ROS   Positive for: Endocrine, Eyes Negative for: Constitutional, Gastrointestinal, Neurological, Skin, Genitourinary, Musculoskeletal, HENT, Cardiovascular, Respiratory, Psychiatric, Allergic/Imm, Heme/Lymph Last edited by Theodore Demark, COA on 12/28/2022  9:55 AM.     ALLERGIES Allergies  Allergen Reactions   Pork-Derived Products    PAST MEDICAL HISTORY Past Medical History:  Diagnosis Date   Chronic hypertension    Complication of anesthesia    with hand surgery, local anes did not work, tried twice- had to put her to sleep   Diabetic retinopathy ([place])    DKA (diabetic ketoacidosis) ([place]) 10/2020   Epidural hematoma ([place]) 07/04/2021   Obstetric vaginal laceration with type 3c third degree perineal laceration 06/29/2021   Pyelonephritis 10/2020   Rh negative status during pregnancy 05/01/2021   s/p Rhogam 05/13/21   Type I diabetes mellitus ([place])    dx 64yr ago   UTI (urinary tract infection)    Past Surgical History:  Procedure Laterality Date   CESAREAN SECTION  05/16/2022   Procedure: CESAREAN SECTION;  Surgeon: BGriffin Basil MD;  [facility]: [facility];  Service: Obstetrics;;   female circumcision Bilateral    clitorectomy as well  FOOT SURGERY  2018   HAND SURGERY  2019   FAMILY HISTORY Family History  Problem Relation Age of Onset   Hyperlipidemia Mother    Hypertension Mother    Kidney disease Father    Alzheimer's disease Father    SOCIAL HISTORY Social History   Tobacco Use   Smoking status: Never   Smokeless tobacco: Never  Vaping Use   Vaping Use: Never used  Substance Use Topics   Alcohol use: Never   Drug use: Never       OPHTHALMIC EXAM:  Base Eye Exam     Visual Acuity (Snellen - Linear)       Right Left   Dist  20/25 -2 20/20          Tonometry (Tonopen, 9:54 AM)       Right Left   Pressure 17 20         Pupils       Dark Light Shape React APD   Right 3 2 Round Brisk None   Left 3 2 Round Brisk None         Visual Fields (Counting fingers)       Left Right    Full Full         Extraocular Movement       Right Left    Full, Ortho Full, Ortho         Neuro/Psych     Oriented x3: Yes   Mood/Affect: Normal         Dilation     Both eyes: 1.0% Mydriacyl, 2.5% Phenylephrine @ 9:54 AM           Slit Lamp and Fundus Exam     Slit Lamp Exam       Right Left   Lids/Lashes normal normal   Conjunctiva/Sclera mild melanosis, Trace Injection, +corkscrew vessels nasally mild melanosis   Cornea trace PEE Clear   Anterior Chamber deep, clear, narrow temporal angle deep and quiet   Iris round and dilated, no NVI round and dilated, no NVI   Lens 1+Cortical changes Clear   Anterior Vitreous syneresis syneresis         Fundus Exam       Right Left   Disc pink and sharp; compact; mild hyperemia; +fibrosis, fine NVD -- regressing; +PPP pink and sharp; compact; +fibrosis w/ fine NVD -- regressing   C/D Ratio 0.2 0.2   Macula good foveal reflex, scattered MA/DBH, exudates, +CWS, fine NVE inferior and temporal macula - regressing, ERM with striae nasally good foveal reflex, ERM with striae, scattered MA/DBH greatest temporal macula -- improving, exudates temporally, scattered fibrosis and NV, preretinal heme superior macula -- improving and shifting inferiorly   Vessels attenuated, Tortuous, early NVE greatest inferior arcades -- ?regressing, Copper wiring attenuated, copper wiring, +NVE -- scattered, +fibrosis, Tortuous   Periphery attached, 360 MA/DBH, good 360 PRP laser changes and posterior fill in -- some room for fill in, scattered NV -- regressing attached, 360 MA/DBH, good 360 PRP with room for fill in, scattered NV -- regressing           IMAGING AND PROCEDURES  Imaging and  Procedures for 12/28/2022  OCT, Retina - OU - Both Eyes       Right Eye Quality was good. Central Foveal Thickness: 263. Progression has worsened. Findings include normal foveal contour, no SRF, intraretinal hyper-reflective material, intraretinal fluid, vitreomacular adhesion , preretinal fibrosis (Mild interval increase in cystic changes greatest inferior macula, +  PRF overlying disc and inferior macula).   Left Eye Quality was good. Central Foveal Thickness: 263. Progression has improved. Findings include normal foveal contour, no IRF, no SRF, epiretinal membrane, macular pucker, vitreous traction, preretinal fibrosis (Mild cystic changes inferior macula -- stably improved, pre retinal fibrosis overlying disc extending to macula w/ mild traction -- stable, pre retinal hyper reflective material ST macula -- improved).   Notes *Images captured and stored on drive  Diagnosis / Impression:  PDR w/ DME OU OD: Mild interval increase in cystic changes greatest inferior macula, +PRF overlying disc and inferior macula OS: Mild cystic changes inferior macula -- stably improved, pre retinal fibrosis overlying disc extending to macula w/ mild traction -- stable, pre retinal hyper reflective material ST macula -- improved  Clinical management:  See below  Abbreviations: NFP - Normal foveal profile. CME - cystoid macular edema. PED - pigment epithelial detachment. IRF - intraretinal fluid. SRF - subretinal fluid. EZ - ellipsoid zone. ERM - epiretinal membrane. ORA - outer retinal atrophy. ORT - outer retinal tubulation. SRHM - subretinal hyper-reflective material. IRHM - intraretinal hyper-reflective material      Intravitreal Injection, Pharmacologic Agent - OS - Left Eye       Time Out 12/28/2022. 11:07 AM. Confirmed correct patient, procedure, site, and patient consented.   Anesthesia Topical anesthesia was used. Anesthetic medications included Lidocaine 2%, Proparacaine 0.5%.    Procedure Preparation included 5% betadine to ocular surface, eyelid speculum. A (32g) needle was used.   Injection: 1.25 mg Bevacizumab 1.52m/0.05ml   Route: Intravitreal, Site: Left Eye   NDC:: 77414-239-53 Lot:: [id number] Expiration date: 01/22/2023   Post-op Post injection exam found visual acuity of at least counting fingers. The patient tolerated the procedure well. There were no complications. The patient received written and verbal post procedure care education. Post injection medications were not given.      Intravitreal Injection, Pharmacologic Agent - OD - Right Eye       Time Out 12/28/2022. 11:12 AM. Confirmed correct patient, procedure, site, and patient consented.   Anesthesia Topical anesthesia was used. Anesthetic medications included Lidocaine 2%, Proparacaine 0.5%.   Procedure Preparation included 5% betadine to ocular surface, eyelid speculum. A supplied needle was used.   Injection: 1.25 mg Bevacizumab 1.253m0.05ml   Route: Intravitreal, Site: Right Eye   NDC: 50242-060-01, Lot: : [id number] date: 02/13/2023   Post-op Post injection exam found visual acuity of at least counting fingers. The patient tolerated the procedure well. There were no complications. The patient received written and verbal post procedure care education.            ASSESSMENT/PLAN:    ICD-10-CM   1. 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 Epiretinal membrane (ERM) of both eyes  H35.373     3. Essential hypertension  I10     4. Hypertensive retinopathy of both eyes  H35.033     5. High-risk pregnancy in second trimester  O09.92      1,2.  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, both eyes  - s/p PRP OD (03.27.23), fill in (05.15.23)  - s/p PRP OS (04.17.23),  fill in (10.20.23)  - s/p IVA OS #1 (12.08.23), #2 (01.08.24) - last A1c 14.5 on (09/01/22), 7.4 on (06.06.23), 8.4 on (04.06.23) 10.4 on (01.23.23) - BCVA 20/25 OD, 20/20 OS -- stable - FA 10.20.23 shows 360 midzonal NVE w/ leakage OU - OCT shows OD: Mild interval increase in cystic changes greatest inferior macula, +PRF overlying disc and inferior macula; OS: Mild cystic changes inferior macula -- stably improved, pre retinal fibrosis overlying disc extending to macula w/ mild traction -- stable, pre retinal hyper reflective material ST macula -- improved - recommend IVA OU (OD #1 and OS #3) today, 02.05.24 - pt wishes to proceed with injection - RBA of procedure discussed, questions answered - IVA informed consent obtained and signed, 12.08.23 (OU) - see procedure note - f/u 4 weeks -- DFE/OCT, possible injection  3-5. History of high risk pregnancy w/ +HTN and hypertensive retinopathy  - s/p Cesarean 06.24.23 (baby boy) - 33 weeks and 1 day, 3lbs, now out of the NICU and at home - history of pre-eclampsia - BP improved since delivery - discussed importance of tight BP control  - continue to monitor  Ophthalmic Meds Ordered this visit:  Meds ordered this encounter  Medications   Bevacizumab (AVASTIN) SOLN 1.25 mg   Bevacizumab (AVASTIN) SOLN 1.25 mg     Return in about 4 weeks (around 01/25/2023) for f/u PDR OU, DFE, OCT.  There are no Patient Instructions on file for this visit.  Explained the diagnoses, plan, and follow up with the patient and they expressed understanding.  Patient expressed understanding of the importance of proper follow up care.   This document serves as a record of services personally performed by Gardiner Sleeper, MD, PhD. It was created on their behalf by Roselee Nova, COMT. The creation of this record is the provider's  dictation and/or activities during the visit.  Electronically signed by: Roselee Nova, COMT 12/28/22 12:15 PM  This document serves as a record of services personally performed by Gardiner Sleeper, MD, PhD. It was created on their behalf by San Jetty. Owens Shark, OA an ophthalmic technician. The creation of this record is the provider's dictation and/or activities during the visit.    Electronically signed by: San Jetty. Owens Shark, [place] 02.05.2024 12:15 PM  Gardiner Sleeper, M.D., Ph.D. Diseases & Surgery of the Retina and Vitreous [facility]  I have reviewed the above documentation for accuracy and completeness, and I agree with the above. Gardiner Sleeper, M.D., Ph.D. 12/28/22 12:17 PM  Abbreviations: M myopia (nearsighted); A astigmatism; H hyperopia (farsighted); P presbyopia; Mrx spectacle prescription;  CTL contact lenses; OD right eye; OS left eye; OU both eyes  XT exotropia; ET esotropia; PEK punctate epithelial keratitis; PEE punctate epithelial erosions; DES dry eye syndrome; MGD meibomian gland dysfunction; ATs artificial tears; PFAT's preservative free artificial tears; [place] nuclear sclerotic cataract; PSC posterior subcapsular cataract; ERM epi-retinal membrane; PVD posterior vitreous detachment; RD retinal detachment; DM diabetes mellitus; DR diabetic retinopathy; NPDR non-proliferative diabetic retinopathy; PDR proliferative diabetic retinopathy; CSME clinically significant macular edema; DME diabetic macular edema; dbh dot blot hemorrhages; CWS cotton wool spot; POAG primary open angle glaucoma; C/D cup-to-disc ratio; HVF humphrey visual field; GVF goldmann visual field; OCT optical coherence tomography; IOP intraocular pressure; BRVO Branch retinal vein occlusion; CRVO central retinal vein occlusion; CRAO central retinal artery occlusion; BRAO branch retinal artery occlusion; RT retinal tear; SB scleral buckle; PPV pars plana vitrectomy; VH Vitreous hemorrhage; PRP panretinal  laser  photocoagulation; IVK intravitreal kenalog; VMT vitreomacular traction; MH Macular hole;  NVD neovascularization of the disc; NVE neovascularization elsewhere; AREDS age related eye disease study; ARMD age related macular degeneration; POAG primary open angle glaucoma; EBMD epithelial/anterior basement membrane dystrophy; ACIOL anterior chamber intraocular lens; IOL intraocular lens; PCIOL posterior chamber intraocular lens; Phaco/IOL phacoemulsification with intraocular lens placement; PRK photorefractive keratectomy; LASIK laser assisted in situ keratomileusis; HTN hypertension; DM diabetes mellitus; COPD chronic obstructive pulmonary disease 

## 2022-12-28 ENCOUNTER — Encounter (INDEPENDENT_AMBULATORY_CARE_PROVIDER_SITE_OTHER): Payer: Self-pay | Admitting: Ophthalmology

## 2022-12-28 ENCOUNTER — Ambulatory Visit (INDEPENDENT_AMBULATORY_CARE_PROVIDER_SITE_OTHER): Payer: Medicaid Other | Admitting: Ophthalmology

## 2022-12-28 DIAGNOSIS — H35373 Puckering of macula, bilateral: Secondary | ICD-10-CM

## 2022-12-28 DIAGNOSIS — I10 Essential (primary) hypertension: Secondary | ICD-10-CM

## 2022-12-28 DIAGNOSIS — E103513 Type 1 diabetes mellitus with proliferative diabetic retinopathy with macular edema, bilateral: Secondary | ICD-10-CM

## 2022-12-28 DIAGNOSIS — H35033 Hypertensive retinopathy, bilateral: Secondary | ICD-10-CM | POA: Diagnosis not present

## 2022-12-28 DIAGNOSIS — O0992 Supervision of high risk pregnancy, unspecified, second trimester: Secondary | ICD-10-CM

## 2022-12-28 MED ORDER — BEVACIZUMAB CHEMO INJECTION 1.25MG/0.05ML SYRINGE FOR KALEIDOSCOPE
1.2500 mg | INTRAVITREAL | Status: AC | PRN
  Administered 2022-12-28: 1.25 mg via INTRAVITREAL

## 2022-12-30 DIAGNOSIS — M24541 Contracture, right hand: Secondary | ICD-10-CM | POA: Diagnosis not present

## 2022-12-30 DIAGNOSIS — M79641 Pain in right hand: Secondary | ICD-10-CM | POA: Diagnosis not present

## 2022-12-30 DIAGNOSIS — M79642 Pain in left hand: Secondary | ICD-10-CM | POA: Diagnosis not present

## 2023-01-08 ENCOUNTER — Ambulatory Visit (INDEPENDENT_AMBULATORY_CARE_PROVIDER_SITE_OTHER): Payer: Medicaid Other | Admitting: Internal Medicine

## 2023-01-08 ENCOUNTER — Encounter: Payer: Self-pay | Admitting: Internal Medicine

## 2023-01-08 VITALS — BP 128/74 | HR 76 | Ht 62.0 in | Wt 123.8 lb

## 2023-01-08 DIAGNOSIS — E1065 Type 1 diabetes mellitus with hyperglycemia: Secondary | ICD-10-CM | POA: Diagnosis not present

## 2023-01-08 DIAGNOSIS — E103592 Type 1 diabetes mellitus with proliferative diabetic retinopathy without macular edema, left eye: Secondary | ICD-10-CM

## 2023-01-08 DIAGNOSIS — E1042 Type 1 diabetes mellitus with diabetic polyneuropathy: Secondary | ICD-10-CM

## 2023-01-08 LAB — POCT GLYCOSYLATED HEMOGLOBIN (HGB A1C): Hemoglobin A1C: 10.9 % — AB (ref 4.0–5.6)

## 2023-01-08 LAB — POCT GLUCOSE (DEVICE FOR HOME USE): Glucose Fasting, POC: 158 mg/dL — AB (ref 70–99)

## 2023-01-08 MED ORDER — INSULIN ASPART 100 UNIT/ML IJ SOLN: INTRAMUSCULAR | 3 refills | Status: DC

## 2023-01-08 MED ORDER — DEXCOM G6 TRANSMITTER MISC: 1.0000 | 3 refills | Status: DC

## 2023-01-08 MED ORDER — DEXCOM G6 SENSOR MISC: 1.0000 | 3 refills | Status: DC

## 2023-01-08 NOTE — Patient Instructions (Signed)

## 2023-01-08 NOTE — Progress Notes (Signed)
Name: Kristin Clay  MRN/ DOB: [id number], 05-28-1986   Age/ Sex: 37 y.o., female    PCP: Dorna Mai, MD   Reason for Endocrinology Evaluation: Type 1 Diabetes Mellitus     Date of Initial Endocrinology Visit: 03/18/2022    PATIENT IDENTIFIER: Kristin Clay is a 37 y.o. female with a past medical history of T1 DM. The patient presented for initial endocrinology clinic visit on 03/18/2022 for consultative assistance with her diabetes management.    HPI: Kristin Clay was    Diagnosed with T1DM at age 60          Hemoglobin A1c has ranged from 8.4% in 2023, peaking at 13.0% in 2020. Patient has required hospitalization within the last 1 year from hyper or hypoglycemia: Yes 02/2022 with DKA  She is currently 24.5 weeks of gestation with a boy  She has 86 months old baby   Paternal aunts with T1 diabetes   Pt with tingling of hands and feet, she has partial ring finger amputations B/L    S/P delivery 6/27/2023[name]  SUBJECTIVE:   During the last visit (09/01/2022): A1c 10.1%   Today (01/08/23): Kristin Clay is here for a follow up on diabetes management. She checks  occasionally .  She has not been able to use the Dexcom again, she is having connectivity issue, she tried to contact the Dexcom but there is no interpreter to offer, I have asked the patient to consider using the help of her husband who speaks English when it comes to the circumstances at is it is important for her to be able to get her Dexcom connected in a timely manner    No recent GI symptoms  She is working in a [employer]  She is c/o occasional pain of her amputated right finger, saw surgery, was deemed cosmetic procedure  This patient with type 1 diabetes is treated with Omnipod (insulin pump). During the visit the pump basal and bolus doses were reviewed including carb/insulin rations and supplemental doses. The clinical list was updated. The glucose meter  download was reviewed in detail to determine if the current pump settings are providing the best glycemic control without excessive hypoglycemia.  Pump and meter download:    Pump   Omnipod  Settings   Insulin type   Novolog   Basal rate       0000 0.65              I:C ratio       0000 1:1                  Sensitivity       0000  45      Goal       0000  120          Type & Model of Pump: omnipod Insulin Type: Currently using novolog.  Body mass index is 22.64 kg/m.  PUMP STATISTICS: Unable to download      HOME DIABETES REGIMEN: Novolog      DIABETIC COMPLICATIONS: Microvascular complications:  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 Denies: CKD  Last eye exam: Completed 09/11/2022  Macrovascular complications:   Denies: CAD, PVD, CVA   PAST HISTORY: Past Medical History:  Past Medical History:  Diagnosis Date   Chronic hypertension    Complication of anesthesia    with hand surgery, local anes did not work, tried twice- had to put her to sleep   Diabetic retinopathy ([place])  DKA (diabetic ketoacidosis) ([place]) 10/2020   Epidural hematoma ([place]) 07/04/2021   Obstetric vaginal laceration with type 3c third degree perineal laceration 06/29/2021   Pyelonephritis 10/2020   Rh negative status during pregnancy 05/01/2021   s/p Rhogam 05/13/21   Type I diabetes mellitus ([place])    dx 53yr ago   UTI (urinary tract infection)    Past Surgical History:  Past Surgical History:  Procedure Laterality Date   CESAREAN SECTION  05/16/2022   Procedure: CESAREAN SECTION;  Surgeon: BGriffin Basil MD;  Location: [facility];  Service: Obstetrics;;   female circumcision Bilateral    clitorectomy as well   FOOT SURGERY  2018   HAND SURGERY  2019    Social History:  reports that she has never smoked. She has never used smokeless tobacco. She reports that she does not drink alcohol and does not use drugs. Family History:  Family History  Problem Relation Age of  Onset   Hyperlipidemia Mother    Hypertension Mother    Kidney disease Father    Alzheimer's disease Father      HOME MEDICATIONS: Allergies as of 01/08/2023       Reactions   Pork-derived Products         Medication List        Accurate as of January 08, 2023 11:55 AM. If you have any questions, ask your nurse or doctor.          STOP taking these medications    insulin glargine 100 UNIT/ML Solostar Pen Commonly known as: LANTUS Stopped by: IDorita Sciara MD       TAKE these medications    Dexcom G6 Sensor Misc 1 Device by Does not apply route as directed.   Dexcom G6 Transmitter Misc 1 Device by Does not apply route as directed.   Insulin Pen Needle 32G X 4 MM Misc 1 Device by Does not apply route in the morning, at noon, in the evening, and at bedtime.   labetalol 300 MG tablet Commonly known as: NORMODYNE Take 1 tablet (300 mg total) by mouth 2 (two) times daily.   lisinopril 10 MG tablet Commonly known as: ZESTRIL Take 1 tablet (10 mg total) by mouth daily.   NIFEdipine 60 MG 24 hr tablet Commonly known as: ADALAT CC Take 1 tablet (60 mg total) by mouth 2 (two) times daily.   NovoLOG FlexPen 100 UNIT/ML FlexPen Generic drug: insulin aspart Inject 5 Units into the skin 3 (three) times daily with meals. 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  Neuro: MS is good with appropriate affect, pt is alert and Ox3    DM foot exam: 01/08/2023  The skin of the feet is intact without sores or ulcerations. The pedal pulses are 2+ on right and 2+ on left. The sensation is decreased to a screening 5.07, 10 gram monofilament bilaterally   DATA REVIEWED:  Lab Results  Component Value Date   HGBA1C 10.9 (A) 01/08/2023   HGBA1C 14.5 (A) 09/01/2022   HGBA1C 7.4 (A) 04/28/2022    Latest Reference Range & Units 09/01/22 14:20  Sodium 135 - 145 mEq/L 128 (L)  Potassium 3.5 - 5.1 mEq/L 4.4  Chloride 96 - 112 mEq/L 96  CO2 19 - 32 mEq/L 21  Glucose 70 - 99 mg/dL 566 (HH)  BUN 6 - 23 mg/dL 14  Creatinine 0.40 - 1.20 mg/dL 0.58  Calcium 8.4 - 10.5 mg/dL 9.4  GFR >60.00 mL/min 115.80  MICROALB/CREAT RATIO 0.0 - 30.0 mg/g 5.4    Latest Reference Range & Units 09/01/22 14:20  TSH 0.35 - 5.50 uIU/mL 0.93  T4,Free(Direct) 0.60 - 1.60 ng/dL 1.11    Latest Reference Range & Units 09/01/22 14:20  Microalb, Ur 0.0 - 1.9 mg/dL 1.3  MICROALB/CREAT RATIO 0.0 - 30.0 mg/g 5.4  Creatinine,U mg/dL 24.0    ASSESSMENT / PLAN / RECOMMENDATIONS:   1) Type 1 Diabetes Mellitus, poorly controlled, With neuropathic, and retinopathic and neuropathic complications - Most recent A1c of  10.9 %. Goal A1c < 7.0%.    -Patient continues with hyperglycemia, but her A1c has trended down from 14.4% -Her in office BG (fasting) 150 mg/DL, so I suspect the reason for hyperglycemia and A1c of 10.9% is due to lack of prandial intake versus insulin-carb mismatch -She has been taking 6 units with meals, but due to an episode of hypoglycemia, I have asked her to take 5 g with each meal and to take 8 units if she is going to eat CHO such as rice/pasta, per  CDE recommendation -Unfortunately she did not bring her OmniPod today so we were unable to make any changes if needed, nor were we able to download her  information from the pump.  She was provided with a pamphlet to register her pump, and I will refer her back to our CDE to help her and troubleshooting CGM connectivity issue  MEDICATIONS: Novolog   EDUCATION / INSTRUCTIONS: BG monitoring instructions: Patient is instructed to check her blood sugars 4 times a day, fasting and 2 hours post meals. Call [facility] if: BG persistently < 60  I reviewed the Rule of 15 for the treatment of hypoglycemia in detail with the patient. Literature supplied.   2) Diabetic complications:  Eye: Does  have known diabetic retinopathy.  Neuro/ Feet: Does  have known diabetic peripheral neuropathy. Renal: Patient does not have known baseline CKD. She is not on an ACEI/ARB at present.   Follow-up in 6 months   Signed electronically by: Mack Guise, MD  [facility]  [facility] [address] [place], [place] [address] Phone: [phone] FAX: [phone]   CC: Dorna Mai, [address] [place] [address] Phone: [phone]  Fax: [phone]    Return to Endocrinology clinic as below: Future Appointments  Date Time Provider [place]  02/01/2023  9:15 AM Bernarda Caffey, MD [facility] None  02/19/2023 11:00 AM Berniece Salines, DO [facility] None  06/17/2023  9:20 AM Dorna Mai, MD [facility] None

## 2023-02-01 ENCOUNTER — Encounter (INDEPENDENT_AMBULATORY_CARE_PROVIDER_SITE_OTHER): Payer: Medicaid Other | Admitting: Ophthalmology

## 2023-02-01 DIAGNOSIS — H35033 Hypertensive retinopathy, bilateral: Secondary | ICD-10-CM

## 2023-02-01 DIAGNOSIS — I10 Essential (primary) hypertension: Secondary | ICD-10-CM

## 2023-02-01 DIAGNOSIS — O0992 Supervision of high risk pregnancy, unspecified, second trimester: Secondary | ICD-10-CM

## 2023-02-01 DIAGNOSIS — H35373 Puckering of macula, bilateral: Secondary | ICD-10-CM

## 2023-02-01 DIAGNOSIS — E103513 Type 1 diabetes mellitus with proliferative diabetic retinopathy with macular edema, bilateral: Secondary | ICD-10-CM

## 2023-02-11 ENCOUNTER — Encounter (INDEPENDENT_AMBULATORY_CARE_PROVIDER_SITE_OTHER): Payer: Medicaid Other | Admitting: Ophthalmology

## 2023-02-11 ENCOUNTER — Encounter: Payer: Self-pay | Admitting: Internal Medicine

## 2023-02-11 DIAGNOSIS — E103513 Type 1 diabetes mellitus with proliferative diabetic retinopathy with macular edema, bilateral: Secondary | ICD-10-CM

## 2023-02-11 DIAGNOSIS — H35373 Puckering of macula, bilateral: Secondary | ICD-10-CM

## 2023-02-11 DIAGNOSIS — H35033 Hypertensive retinopathy, bilateral: Secondary | ICD-10-CM

## 2023-02-11 DIAGNOSIS — I10 Essential (primary) hypertension: Secondary | ICD-10-CM

## 2023-02-11 DIAGNOSIS — O0992 Supervision of high risk pregnancy, unspecified, second trimester: Secondary | ICD-10-CM

## 2023-02-15 ENCOUNTER — Telehealth: Payer: Self-pay | Admitting: Nutrition

## 2023-02-15 NOTE — Telephone Encounter (Signed)
LVM with times available to see her this week, and number to call me back to confirm or reschedule

## 2023-02-16 DIAGNOSIS — S63232A Subluxation of proximal interphalangeal joint of right middle finger, initial encounter: Secondary | ICD-10-CM | POA: Diagnosis not present

## 2023-02-18 ENCOUNTER — Encounter (INDEPENDENT_AMBULATORY_CARE_PROVIDER_SITE_OTHER): Payer: Medicaid Other | Admitting: Ophthalmology

## 2023-02-19 ENCOUNTER — Encounter: Payer: Self-pay | Admitting: Cardiology

## 2023-02-19 ENCOUNTER — Ambulatory Visit: Payer: Medicaid Other | Admitting: Cardiology

## 2023-02-19 ENCOUNTER — Ambulatory Visit (INDEPENDENT_AMBULATORY_CARE_PROVIDER_SITE_OTHER): Payer: Medicaid Other | Admitting: Cardiology

## 2023-02-19 VITALS — BP 113/80 | HR 82

## 2023-02-19 DIAGNOSIS — E1010 Type 1 diabetes mellitus with ketoacidosis without coma: Secondary | ICD-10-CM

## 2023-02-19 DIAGNOSIS — I10 Essential (primary) hypertension: Secondary | ICD-10-CM | POA: Diagnosis not present

## 2023-02-19 NOTE — Patient Instructions (Signed)
Medication Instructions:  Your physician recommends that you continue on your current medications as directed. Please refer to the Current Medication list given to you today.  *If you need a refill on your cardiac medications before your next appointment, please call your pharmacy*   Lab Work: NONE ordered at this time of appointment  If you have labs (blood work) drawn today and your tests are completely normal, you will receive your results only by: [place] (if you have MyChart) OR A paper copy in the mail If you have any lab test that is abnormal or we need to change your treatment, we will call you to review the results.   Testing/Procedures: NONE ordered at this time of appointment   Follow-Up: At [facility], you and your health needs are our priority.  As part of our continuing mission to provide you with exceptional heart care, we have created designated Provider Care Teams.  These Care Teams include your primary Cardiologist (physician) and Advanced Practice Providers (APPs -  Physician Assistants and Nurse Practitioners) who all work together to provide you with the care you need, when you need it.  Your next appointment:   1 year(s) SOONER if you become pregnant   Provider:   Berniece Salines, DO     Other Instructions Bio Freeze over the counter at [facility] or [facility]

## 2023-02-19 NOTE — Progress Notes (Signed)
Cardio-Obstetrics Clinic  Follow Up Note   Date:  02/19/2023   ID:  Kristin Clay, DOB [birth year], MRN [id number]  PCP:  Dorna Mai, MD   [facility] Providers Cardiologist:  Berniece Salines, DO  Electrophysiologist:  None        Referring MD: Dorna Mai, MD   Chief Complaint: " I am doing well"  History of Present Illness:    Kristin Clay is a 37 y.o. female [G3P0212] who returns for follow up of chronic hypertension and Type 1 diabetes mellitus.   Medical history includes type 1 diabetes which was diagnosed at the age of 12 in her home country in [place] she has been on insulin since, hypertension was previously on losartan hydrochlorothiazide but has been transitioned to nifedipine since she became pregnant, history of severe preeclampsia was referred to the cardio obstetrics clinic for cardiovascular care during pregnancy and assessment of cardiovascular long-term risk.   I saw the patient on January 02, 2022 at that time we talked about her cardiovascular risk her blood pressure was within target no changes were made.  I also started the patient on aspirin 81 mg daily.  I saw the patient on 03/27/2022 at that time she her blood pressure was at target. She was on preeclampsia prophylaxis  At her visit on 08/29/2023 at that time she was doing well no med changes.   Today she is her with her interpreter - she offers no complaints. She is taking birthcontrol and is not planning on having another baby at this time.  Prior CV Studies Reviewed: The following studies were reviewed today:   Past Medical History:  Diagnosis Date   Chronic hypertension    Complication of anesthesia    with hand surgery, local anes did not work, tried twice- had to put her to sleep   Diabetic retinopathy ([place])    DKA (diabetic ketoacidosis) ([place]) 10/2020   Epidural hematoma ([place]) 07/04/2021   Obstetric vaginal laceration with type 3c third degree perineal laceration  06/29/2021   Pyelonephritis 10/2020   Rh negative status during pregnancy 05/01/2021   s/p Rhogam 05/13/21   Type I diabetes mellitus ([place])    dx 63yrs ago   UTI (urinary tract infection)     Past Surgical History:  Procedure Laterality Date   CESAREAN SECTION  05/16/2022   Procedure: CESAREAN SECTION;  Surgeon: Griffin Basil, MD;  Location: [facility];  Service: Obstetrics;;   female circumcision Bilateral    clitorectomy as well   FOOT SURGERY  2018   HAND SURGERY  2019      OB History     Gravida  3   Para  2   Term  0   Preterm  2   AB  1   Living  2      SAB  1   IAB  0   Ectopic  0   Multiple  0   Live Births  2               Current Medications: Current Meds  Medication Sig   Continuous Blood Gluc Sensor (DEXCOM G6 SENSOR) MISC 1 Device by Does not apply route as directed.   Continuous Blood Gluc Transmit (DEXCOM G6 TRANSMITTER) MISC 1 Device by Does not apply route as directed.   insulin aspart (NOVOLOG) 100 UNIT/ML injection Max daily 50 units   labetalol (NORMODYNE) 300 MG tablet Take 1 tablet (300 mg total) by mouth 2 (two)  times daily.   lisinopril (ZESTRIL) 10 MG tablet Take 1 tablet (10 mg total) by mouth daily.   NIFEdipine (ADALAT CC) 60 MG 24 hr tablet Take 1 tablet (60 mg total) by mouth 2 (two) times daily.     Allergies:   Pork-derived products   Social History   Socioeconomic History   Marital status: Married    Spouse name: Not on file   Number of children: Not on file   Years of education: Not on file   Highest education level: Not on file  Occupational History   Not on file  Tobacco Use   Smoking status: Never   Smokeless tobacco: Never  Vaping Use   Vaping Use: Never used  Substance and Sexual Activity   Alcohol use: Never   Drug use: Never   Sexual activity: Not Currently    Birth control/protection: None  Other Topics Concern   Not on file  Social History Narrative   ** Merged History Encounter **        ** Merged History Encounter **       Social Determinants of Health   Financial Resource Strain: High Risk (05/30/2021)   Overall Financial Resource Strain (CARDIA)    Difficulty of Paying Living Expenses: Very hard  Food Insecurity: Food Insecurity Present (03/12/2022)   Hunger Vital Sign    Worried About Running Out of Food in the Last Year: Sometimes true    Ran Out of Food in the Last Year: Sometimes true  Transportation Needs: Unmet Transportation Needs (03/12/2022)   PRAPARE - Hydrologist (Medical): Yes    Lack of Transportation (Non-Medical): Yes  Physical Activity: Not on file  Stress: Not on file  Social Connections: Not on file      Family History  Problem Relation Age of Onset   Hyperlipidemia Mother    Hypertension Mother    Kidney disease Father    Alzheimer's disease Father       ROS:   Please see the history of present illness.     All other systems reviewed and are negative.   Labs/EKG Reviewed:    EKG:   EKG is was not ordered today.    Recent Labs: 05/06/2022: Magnesium 4.8 05/16/2022: ALT 10 05/18/2022: Hemoglobin 9.3; Platelets 236 09/01/2022: BUN 14; Creatinine, Ser 0.58; Potassium 4.4; Sodium 128; TSH 0.93   Recent Lipid Panel Lab Results  Component Value Date/Time   CHOL 179 10/29/2019 02:43 AM   TRIG 77 10/29/2019 02:43 AM   HDL 35 (L) 10/29/2019 02:43 AM   CHOLHDL 5.1 10/29/2019 02:43 AM   LDLCALC 129 (H) 10/29/2019 02:43 AM    Physical Exam:    VS:  BP 113/80   Pulse 82   SpO2 100%     Wt Readings from Last 3 Encounters:  01/08/23 123 lb 12.8 oz (56.2 kg)  12/16/22 128 lb 6.4 oz (58.2 kg)  09/01/22 107 lb 12.8 oz (48.9 kg)     GEN:  Well nourished, well developed in no acute distress HEENT: Normal NECK: No JVD; No carotid bruits LYMPHATICS: No lymphadenopathy CARDIAC: RRR, no murmurs, rubs, gallops RESPIRATORY:  Clear to auscultation without rales, wheezing or rhonchi  ABDOMEN: Soft,  non-tender, non-distended MUSCULOSKELETAL:  No edema; No deformity  SKIN: Warm and dry NEUROLOGIC:  Alert and oriented x 3 PSYCHIATRIC:  Normal affect    Risk Assessment/Risk Calculators:  ASSESSMENT & PLAN:    Chronic hypertension  Diabetes Mellitus  Hx of severe preeclampsia   She is doing well from a CV standpoint . BP at target . She plans for other children in the future.  Will continue to follow patient closely.  Fu in 12 months   Patient Instructions  Medication Instructions:  Your physician recommends that you continue on your current medications as directed. Please refer to the Current Medication list given to you today.  *If you need a refill on your cardiac medications before your next appointment, please call your pharmacy*   Lab Work: NONE ordered at this time of appointment  If you have labs (blood work) drawn today and your tests are completely normal, you will receive your results only by: [place] (if you have MyChart) OR A paper copy in the mail If you have any lab test that is abnormal or we need to change your treatment, we will call you to review the results.   Testing/Procedures: NONE ordered at this time of appointment   Follow-Up: At [facility], you and your health needs are our priority.  As part of our continuing mission to provide you with exceptional heart care, we have created designated Provider Care Teams.  These Care Teams include your primary Cardiologist (physician) and Advanced Practice Providers (APPs -  Physician Assistants and Nurse Practitioners) who all work together to provide you with the care you need, when you need it.  Your next appointment:   1 year(s) SOONER if you become pregnant   Provider:   Berniece Salines, DO     Other Instructions Bio Freeze [facility] the counter [facility]    Dispo:  No follow-ups on file.   Medication Adjustments/Labs and Tests Ordered: Current  medicines are reviewed at length with the patient today.  Concerns regarding medicines are outlined above.  Tests Ordered: No orders of the defined types were placed in this encounter.  Medication Changes: No orders of the defined types were placed in this encounter.

## 2023-02-24 ENCOUNTER — Encounter (INDEPENDENT_AMBULATORY_CARE_PROVIDER_SITE_OTHER): Payer: Medicaid Other | Admitting: Ophthalmology

## 2023-02-24 DIAGNOSIS — H35033 Hypertensive retinopathy, bilateral: Secondary | ICD-10-CM

## 2023-02-24 DIAGNOSIS — H35373 Puckering of macula, bilateral: Secondary | ICD-10-CM

## 2023-02-24 DIAGNOSIS — O0992 Supervision of high risk pregnancy, unspecified, second trimester: Secondary | ICD-10-CM

## 2023-02-24 DIAGNOSIS — E103513 Type 1 diabetes mellitus with proliferative diabetic retinopathy with macular edema, bilateral: Secondary | ICD-10-CM

## 2023-02-24 DIAGNOSIS — I10 Essential (primary) hypertension: Secondary | ICD-10-CM

## 2023-03-01 NOTE — Progress Notes (Signed)
[facility] - Clinic Note  03/02/2023     CHIEF COMPLAINT Patient presents for Retina Follow Up   HISTORY OF PRESENT ILLNESS: Kristin Clay is a 37 y.o. female who presents to the clinic today for:   HPI     Retina Follow Up   Patient presents with  Diabetic Retinopathy.  In both eyes.  This started 9 weeks ago.  I, the attending physician,  performed the HPI with the patient and updated documentation appropriately.        Comments   Patient here for 4 weeks (9 weeks) for retina follow up for PDR OU. Patient states vision doing good. No eye pain.      Last edited by Rennis Chris, MD on 03/02/2023  5:11 PM.    Pt is delayed to follow up from 4 weeks to 9 weeks due to being sick and forgetting about her appts, pt states vision is stable, she states her insulin pump isnt working right now, her BP is under control  Referring physician: Aura Camps MD [address] [place], [place] [address]  HISTORICAL INFORMATION:   Selected notes from the MEDICAL RECORD NUMBER Referred by Dr. Karleen Hampshire for diabetic retinal evaluation LEE:  Ocular Hx- PMH-pregnancy, DM    CURRENT MEDICATIONS: No current outpatient medications on file. (Ophthalmic Drugs)   No current facility-administered medications for this visit. (Ophthalmic Drugs)   Current Outpatient Medications (Other)  Medication Sig   Continuous Blood Gluc Sensor (DEXCOM G6 SENSOR) MISC 1 Device by Does not apply route as directed.   Continuous Blood Gluc Transmit (DEXCOM G6 TRANSMITTER) MISC 1 Device by Does not apply route as directed.   insulin aspart (NOVOLOG) 100 UNIT/ML injection Max daily 50 units   labetalol (NORMODYNE) 300 MG tablet Take 1 tablet (300 mg total) by mouth 2 (two) times daily.   lisinopril (ZESTRIL) 10 MG tablet Take 1 tablet (10 mg total) by mouth daily.   NIFEdipine (ADALAT CC) 60 MG 24 hr tablet Take 1 tablet (60 mg total) by mouth 2 (two) times daily.    No current facility-administered medications for this visit. (Other)   REVIEW OF SYSTEMS: ROS   Positive for: Endocrine, Eyes Negative for: Constitutional, Gastrointestinal, Neurological, Skin, Genitourinary, Musculoskeletal, HENT, Cardiovascular, Respiratory, Psychiatric, Allergic/Imm, Heme/Lymph Last edited by Laddie Aquas, COA on 03/02/2023  8:21 AM.      ALLERGIES Allergies  Allergen Reactions   Pork-Derived Products    PAST MEDICAL HISTORY Past Medical History:  Diagnosis Date   Chronic hypertension    Complication of anesthesia    with hand surgery, local anes did not work, tried twice- had to put her to sleep   Diabetic retinopathy    DKA (diabetic ketoacidosis) 10/2020   Epidural hematoma 07/04/2021   Obstetric vaginal laceration with type 3c third degree perineal laceration 06/29/2021   Pyelonephritis 10/2020   Rh negative status during pregnancy 05/01/2021   s/p Rhogam 05/13/21   Type I diabetes mellitus    dx 85yrs ago   UTI (urinary tract infection)    Past Surgical History:  Procedure Laterality Date   CESAREAN SECTION  05/16/2022   Procedure: CESAREAN SECTION;  Surgeon: Warden Fillers, MD;  Location: [facility] LD [facility]S;  Service: Obstetrics;;   female circumcision Bilateral    clitorectomy as well   FOOT SURGERY  2018   HAND SURGERY  2019   FAMILY HISTORY Family History  Problem Relation Age of Onset  Hyperlipidemia Mother    Hypertension Mother    Kidney disease Father    Alzheimer's disease Father    SOCIAL HISTORY Social History   Tobacco Use   Smoking status: Never   Smokeless tobacco: Never  Vaping Use   Vaping Use: Never used  Substance Use Topics   Alcohol use: Never   Drug use: Never       OPHTHALMIC EXAM:  Base Eye Exam     Visual Acuity (Snellen - Linear)       Right Left   Dist [place] 20/25 -1 20/20         Tonometry (Tonopen, 8:18 AM)       Right Left   Pressure 15 15         Pupils       Dark Light Shape React  APD   Right 3 2 Round Brisk None   Left 3 2 Round Brisk None         Visual Fields (Counting fingers)       Left Right    Full Full         Extraocular Movement       Right Left    Full, Ortho Full, Ortho         Neuro/Psych     Oriented x3: Yes   Mood/Affect: Normal         Dilation     Both eyes: 1.0% Mydriacyl, 2.5% Phenylephrine @ 8:18 AM           Slit Lamp and Fundus Exam     Slit Lamp Exam       Right Left   Lids/Lashes normal normal   Conjunctiva/Sclera mild melanosis, Trace Injection, +corkscrew vessels nasally mild melanosis   Cornea trace PEE Clear   Anterior Chamber deep, clear, narrow temporal angle deep and quiet   Iris round and dilated, no NVI round and dilated, no NVI   Lens 1+Cortical changes Clear   Anterior Vitreous syneresis syneresis         Fundus Exam       Right Left   Disc pink and sharp; compact; mild hyperemia; +fibrosis, fine NVD -- regressing; +PPP pink and sharp; compact; +fibrosis w/ fine NVD -- regressing   C/D Ratio 0.2 0.2   Macula good foveal reflex, scattered MA/DBH, punctate exudates and cystic changes temporal macula, +CWS, fine NVE inferior and temporal macula - regressing, ERM with striae nasally good foveal reflex, ERM with striae, scattered MA/DBH greatest temporal macula -- improving, exudates temporally, scattered fibrosis and NV, preretinal heme superior macula -- improving   Vessels attenuated, Tortuous, early NVE greatest inferior arcades -- ?regressing, Copper wiring attenuated, copper wiring, +NVE -- scattered, +fibrosis, Tortuous   Periphery attached, 360 MA/DBH, good 360 PRP laser changes and posterior fill in -- some room for fill in, scattered NV -- regressing attached, 360 MA/DBH, good 360 PRP with room for fill in, scattered NV -- regressing           IMAGING AND PROCEDURES  Imaging and Procedures for 03/02/2023  OCT, Retina - OU - Both Eyes       Right Eye Quality was good. Central  Foveal Thickness: 244. Progression has improved. Findings include normal foveal contour, no SRF, intraretinal hyper-reflective material, intraretinal fluid, vitreomacular adhesion , preretinal fibrosis (interval improvement in IRF/IRHM -- non central cystic changes remain greatest IT macula, +PRF overlying disc and inferior macula).   Left Eye Quality was good. Central Foveal Thickness: 253.  Progression has improved. Findings include normal foveal contour, no IRF, no SRF, epiretinal membrane, macular pucker, vitreous traction, preretinal fibrosis (Mild cystic changes inferior macula -- stably improved, pre retinal fibrosis overlying disc extending to macula w/ mild traction -- stable, pre retinal hyper reflective material ST macula -- improved).   Notes *Images captured and stored on drive  Diagnosis / Impression:  PDR w/ DME OU OD: interval improvement in IRF/IRHM -- non central cystic changes remain greatest IT macula, +PRF overlying disc and inferior macula OS: Mild cystic changes inferior macula -- stably improved, pre retinal fibrosis overlying disc extending to macula w/ mild traction -- stable, pre retinal hyper reflective material ST macula -- improved  Clinical management:  See below  Abbreviations: NFP - Normal foveal profile. CME - cystoid macular edema. PED - pigment epithelial detachment. IRF - intraretinal fluid. SRF - subretinal fluid. EZ - ellipsoid zone. ERM - epiretinal membrane. ORA - outer retinal atrophy. ORT - outer retinal tubulation. SRHM - subretinal hyper-reflective material. IRHM - intraretinal hyper-reflective material      Intravitreal Injection, Pharmacologic Agent - OD - Right Eye       Time Out 03/02/2023. 8:16 AM. Confirmed correct patient, procedure, site, and patient consented.   Anesthesia Topical anesthesia was used. Anesthetic medications included Lidocaine 2%, Proparacaine 0.5%.   Procedure Preparation included 5% betadine to ocular surface, eyelid  speculum. A supplied needle was used.   Injection: 1.25 mg Bevacizumab 1.25mg /0.28ml   Route: Intravitreal, Site: Right Eye   NDC: P3213405, Lot: [id number], Expiration date: 04/11/2023   Post-op Post injection exam found visual acuity of at least counting fingers. The patient tolerated the procedure well. There were no complications. The patient received written and verbal post procedure care education.      Intravitreal Injection, Pharmacologic Agent - OS - Left Eye       Time Out 03/02/2023. 8:16 AM. Confirmed correct patient, procedure, site, and patient consented.   Anesthesia Topical anesthesia was used. Anesthetic medications included Lidocaine 2%, Proparacaine 0.5%.   Procedure Preparation included 5% betadine to ocular surface, eyelid speculum. A (32g) needle was used.   Injection: 1.25 mg Bevacizumab 1.25mg /0.46ml   Route: Intravitreal, Site: Left Eye   NDC: P3213405, Lot: [id number], Expiration date: 05/27/2023   Post-op Post injection exam found visual acuity of at least counting fingers. The patient tolerated the procedure well. There were no complications. The patient received written and verbal post procedure care education. Post injection medications were not given.            ASSESSMENT/PLAN:    ICD-10-CM   1. Proliferative diabetic retinopathy of both eyes with macular edema associated with type 1 diabetes mellitus  E10.3513 OCT, Retina - OU - Both Eyes    Intravitreal Injection, Pharmacologic Agent - OD - Right Eye    Intravitreal Injection, Pharmacologic Agent - OS - Left Eye    Bevacizumab (AVASTIN) SOLN 1.25 mg    Bevacizumab (AVASTIN) SOLN 1.25 mg    2. Epiretinal membrane (ERM) of both eyes  H35.373     3. Essential hypertension  I10     4. Hypertensive retinopathy of both eyes  H35.033     5. High-risk pregnancy in second trimester  O09.92      1,2. 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, both eyes  - delayed to follow up  from 4 weeks to 9 weeks (02.05.24 to 04.09.24)  - s/p PRP OD (03.27.23), fill in (05.15.23)  - s/p PRP OS (  04.17.23), fill in (10.20.23)  - s/p IVA OS #1 (12.08.23), #2 (01.08.24) - last A1c 14.5 on (09/01/22), 7.4 on (06.06.23), 8.4 on (04.06.23) 10.4 on (01.23.23) - BCVA 20/25 OD, 20/20 OS -- stable - FA 10.20.23 shows 360 midzonal NVE w/ leakage OU - OCT shows OD: interval improvement in IRF/IRHM -- non central cystic changes remain greatest IT macula, +PRF overlying disc and inferior macula; OS: Mild cystic changes inferior macula -- stably improved, pre retinal fibrosis overlying disc extending to macula w/ mild traction -- stable, pre retinal hyper reflective material ST macula -- improved at 9 weeks - recommend IVA OU (OD #1 and OS #3) today, 02.05.24 with follow up in 6 weeks - pt wishes to proceed with injection - RBA of procedure discussed, questions answered - IVA informed consent obtained and signed, 12.08.23 (OU) - see procedure note - f/u 6 weeks -- DFE/OCT, possible injection  3-5. History of high risk pregnancy w/ +HTN and hypertensive retinopathy  - s/p Cesarean 06.24.23 (baby boy) - 33 weeks and 1 day, 3lbs, now out of the NICU and at home - history of pre-eclampsia - BP improved since delivery - discussed importance of tight BP control  - continue to monitor  Ophthalmic Meds Ordered this visit:  Meds ordered this encounter  Medications   Bevacizumab (AVASTIN) SOLN 1.25 mg   Bevacizumab (AVASTIN) SOLN 1.25 mg     Return in about 6 weeks (around 04/13/2023) for f/u PDR OU, DFE, OCT.  There are no Patient Instructions on file for this visit.  Explained the diagnoses, plan, and follow up with the patient and they expressed understanding.  Patient expressed understanding of the importance of proper follow up care.   This document serves as a record of services personally performed by Karie ChimeraBrian G. Lesean Woolverton, MD, PhD. It was created on their behalf by Gerilyn Nestlehristine Shamlian, COT an  ophthalmic technician. The creation of this record is the provider's dictation and/or activities during the visit.    Electronically signed by:  Gerilyn Nestlehristine Shamlian, COT  04.08.24 12:53 PM  This document serves as a record of services personally performed by Karie ChimeraBrian G. Marne Meline, MD, PhD. It was created on their behalf by Glee ArvinAmanda J. Manson PasseyBrown, OA an ophthalmic technician. The creation of this record is the provider's dictation and/or activities during the visit.    Electronically signed by: Glee ArvinAmanda J. Kristopher OppenheimBrown, OA 04.09.2024 12:53 PM  Karie ChimeraBrian G. Argusta Mcgann, M.D., Ph.D. Diseases & Surgery of the Retina and Vitreous [facility]  I have reviewed the above documentation for accuracy and completeness, and I agree with the above. Karie ChimeraBrian G. Janae Bonser, M.D., Ph.D. 03/03/23 12:53 PM   Abbreviations: M myopia (nearsighted); A astigmatism; H hyperopia (farsighted); P presbyopia; Mrx spectacle prescription;  CTL contact lenses; OD right eye; OS left eye; OU both eyes  XT exotropia; ET esotropia; PEK punctate epithelial keratitis; PEE punctate epithelial erosions; DES dry eye syndrome; MGD meibomian gland dysfunction; ATs artificial tears; PFAT's preservative free artificial tears; NSC nuclear sclerotic cataract; PSC posterior subcapsular cataract; ERM epi-retinal membrane; PVD posterior vitreous detachment; RD retinal detachment; DM diabetes mellitus; DR diabetic retinopathy; NPDR non-proliferative diabetic retinopathy; PDR proliferative diabetic retinopathy; CSME clinically significant macular edema; DME diabetic macular edema; dbh dot blot hemorrhages; CWS cotton wool spot; POAG primary open angle glaucoma; C/D cup-to-disc ratio; HVF humphrey visual field; GVF goldmann visual field; OCT optical coherence tomography; IOP intraocular pressure; BRVO Branch retinal vein occlusion; CRVO central retinal vein occlusion; CRAO central retinal artery occlusion;  BRAO branch retinal artery occlusion; RT retinal tear; SB  scleral buckle; PPV pars plana vitrectomy; VH Vitreous hemorrhage; PRP panretinal laser photocoagulation; IVK intravitreal kenalog; VMT vitreomacular traction; MH Macular hole;  NVD neovascularization of the disc; NVE neovascularization elsewhere; AREDS age related eye disease study; ARMD age related macular degeneration; POAG primary open angle glaucoma; EBMD epithelial/anterior basement membrane dystrophy; ACIOL anterior chamber intraocular lens; IOL intraocular lens; PCIOL posterior chamber intraocular lens; Phaco/IOL phacoemulsification with intraocular lens placement; PRK photorefractive keratectomy; LASIK laser assisted in situ keratomileusis; HTN hypertension; DM diabetes mellitus; COPD chronic obstructive pulmonary disease

## 2023-03-02 ENCOUNTER — Encounter (INDEPENDENT_AMBULATORY_CARE_PROVIDER_SITE_OTHER): Payer: Self-pay | Admitting: Ophthalmology

## 2023-03-02 ENCOUNTER — Ambulatory Visit (INDEPENDENT_AMBULATORY_CARE_PROVIDER_SITE_OTHER): Payer: Medicaid Other | Admitting: Ophthalmology

## 2023-03-02 DIAGNOSIS — I10 Essential (primary) hypertension: Secondary | ICD-10-CM

## 2023-03-02 DIAGNOSIS — E103513 Type 1 diabetes mellitus with proliferative diabetic retinopathy with macular edema, bilateral: Secondary | ICD-10-CM

## 2023-03-02 DIAGNOSIS — H35373 Puckering of macula, bilateral: Secondary | ICD-10-CM

## 2023-03-02 DIAGNOSIS — H35033 Hypertensive retinopathy, bilateral: Secondary | ICD-10-CM

## 2023-03-02 DIAGNOSIS — O0992 Supervision of high risk pregnancy, unspecified, second trimester: Secondary | ICD-10-CM

## 2023-03-02 MED ORDER — BEVACIZUMAB CHEMO INJECTION 1.25MG/0.05ML SYRINGE FOR KALEIDOSCOPE
1.2500 mg | INTRAVITREAL | Status: AC | PRN
  Administered 2023-03-02: 1.25 mg via INTRAVITREAL

## 2023-03-24 ENCOUNTER — Ambulatory Visit (INDEPENDENT_AMBULATORY_CARE_PROVIDER_SITE_OTHER): Payer: Medicaid Other | Admitting: Family Medicine

## 2023-03-24 ENCOUNTER — Encounter: Payer: Self-pay | Admitting: Family Medicine

## 2023-03-24 VITALS — BP 123/87 | HR 76 | Ht 62.0 in | Wt 117.0 lb

## 2023-03-24 DIAGNOSIS — Z3169 Encounter for other general counseling and advice on procreation: Secondary | ICD-10-CM

## 2023-03-24 DIAGNOSIS — Z30011 Encounter for initial prescription of contraceptive pills: Secondary | ICD-10-CM | POA: Diagnosis not present

## 2023-03-24 DIAGNOSIS — N921 Excessive and frequent menstruation with irregular cycle: Secondary | ICD-10-CM | POA: Diagnosis not present

## 2023-03-24 MED ORDER — NORETHINDRONE 0.35 MG PO TABS: 1.0000 | ORAL_TABLET | Freq: Every day | ORAL | 3 refills | Status: DC

## 2023-03-24 NOTE — Progress Notes (Addendum)
Contraception/Family Planning VISIT ENCOUNTER NOTE  Subjective:   Kristin Clay is a 37 y.o. [id number] female  here for Nexplanon removal  Last pap smear was on 6/20233 and was normal.  No other gynecologic concerns.  Nexplanon was placed August 2023, currently having amenorrhea and light spotting that can come as soon as every two weeks. She does not like this. She is not currently breastfeeding. She does desire a future pregnancy.   Of note patient has a complex medical history of T1DM (retinopathy, peripheral neuropathy), Chronic hypertension and a history of severe preeclampsia with preterm delivery.   Interpreter (arabic), in person: [place] Interpreter services was used the entire visit for history taking and plan of care  Gynecologic History No LMP recorded (lmp unknown). Patient has had an implant. Contraception: Nexplanon  Health Maintenance Due  Topic Date Due   COVID-19 Vaccine (1) Never done   OPHTHALMOLOGY EXAM  Never done     The following portions of the patient's history were reviewed and updated as appropriate: allergies, current medications, past family history, past medical history, past social history, past surgical history and problem list.  Review of Systems Pertinent items are noted in HPI.   Objective:  BP 123/87   Pulse 76   Ht 5\' 2"  (1.575 m)   Wt 117 lb (53.1 kg)   LMP  (LMP Unknown)   Breastfeeding No   BMI 21.40 kg/m  Gen: well appearing, NAD HEENT: no scleral icterus CV: RR Lung: Normal WOB Ext: warm well perfused  Reviewed notes extensively as well as labs work from Feb 2024   Assessment and Plan:    1. Encounter for initial prescription of contraceptive pills - We has an extensive discussed about nexplanon removal with pregnancy planning. I was presenting the plan for Nexplanon removal with starting POP and needing back up with condoms for 2 weeks.  -patient opted to keep Nexplanon (see preconception counseling)  norethindrone (MICRONOR) 0.35 MG tablet; Take 1 tablet (0.35 mg total) by mouth daily.  Dispense: 28 tablet; Refill: 3  2. Breakthrough bleeding Discussed options for bleeding management in the setting of Nexplanon.  - norethindrone (MICRONOR) 0.35 MG tablet; Take 1 tablet (0.35 mg total) by mouth daily.  Dispense: 28 tablet; Refill: 3    3. Encounter for preconception consultation Patient is planning on conceiving. We reviewed her current problems and medications in terms of pregnancy safety. Additionally discussed fertility awareness, when to take a pregnancy test, and starting a prenatal vitamin. We discussed general early pregnancy precautions. Reviewed services at the office regarding prenatal care. She voiced understanding.  Problem specific recommendations are:  #Uncontrolled T1DM, last HA1C 10.9 in Feb 2024. She has not followed up with endocrinology. She has a Dexcom but has not had it placed by Nutrition to help monitor her sugars. Her insulin HAS not been adjusted at all since the elevated value in Feb - Reviewed the recommendation that her diabetes be in better control prior to pregnancy and increased risk of miscarriage and malformations with elevated BG.  -Offered to help facilitate nutrition visit to help with Dexcom placement - patient likely need improved management of her glucose levels  #Severe PEC in prior pregnancy, Discussed she is at high risk for this again.  BP is well controlled today on medications for chronic HTN  #Prior CSx2- would need repeat CS  After discussing her diabetes control at length the patient decided to keep her nexplanon and we discussed bleeding control/management with POP  for 3 months. She accepted this intervention.   Please refer to After Visit Summary for other counseling recommendations.   Return in about 3 months (around 06/24/2023) for PRN Nexplanon removal. .  Future Appointments  Date Time Provider Department Center  04/12/2023  9:45 AM  Jessica Priest, RN [facility]  06/17/2023  9:20 AM Georganna Skeans, MD [facility] None    Federico Flake, MD Center for [facility], [facility]

## 2023-04-05 ENCOUNTER — Ambulatory Visit: Payer: Medicaid Other | Admitting: Internal Medicine

## 2023-04-12 ENCOUNTER — Encounter: Payer: Medicaid Other | Attending: Internal Medicine | Admitting: Nutrition

## 2023-04-12 DIAGNOSIS — E1042 Type 1 diabetes mellitus with diabetic polyneuropathy: Secondary | ICD-10-CM

## 2023-04-12 NOTE — Patient Instructions (Signed)
Call when you get your new phone and we will order the OmniPods Call again, when you pick up the omnipods to schedule the pump restart training.

## 2023-04-12 NOTE — Progress Notes (Signed)
Patient is here with an interpreter, and with her Dexcom G6 transmitter and sensors.  Since her phone does not support the app, she was given a G6 reader and the sensor and transmitter were linked to the reader.  She is wanting to get pregnant, but her doctor does not want her to until her blood sugars are in better control.  Last HgbA1C was 10.6 % in February.  She was told that her blood sugars were much better on her insulin pump and wants to go back on this ASAP.  She is currently taking 20 Lantus q AM, and Novolog 5u ac meals and taking up to 8u if blood sugars are "high".  FBS today was 219.  She is currently using a meter to test her blood sugar readings.    She is wanting to go back on her OmniPOd pump, but because her phone does not support the app, she was told that we need to get her a different pump.  She wants the OmniPod, and said she is willing to get a new phone for this pump to work.  She was given a list of phones that support the Dexcom G6 mobile app, and my number to call when she gets this new phone.  She was also told that once she gets the phone, we can then order her the pods from [facility], and I can restart her on this.  She agreed to do this and had no final questions. Dexcom sensor: [id number], transmitter ID: [id number].  The sensor linked to the reader and will start after the  2 hour warm up is over.

## 2023-04-13 ENCOUNTER — Ambulatory Visit (INDEPENDENT_AMBULATORY_CARE_PROVIDER_SITE_OTHER): Payer: Medicaid Other | Admitting: Ophthalmology

## 2023-04-13 ENCOUNTER — Encounter (INDEPENDENT_AMBULATORY_CARE_PROVIDER_SITE_OTHER): Payer: Self-pay | Admitting: Ophthalmology

## 2023-04-13 DIAGNOSIS — Z794 Long term (current) use of insulin: Secondary | ICD-10-CM

## 2023-04-13 DIAGNOSIS — I10 Essential (primary) hypertension: Secondary | ICD-10-CM

## 2023-04-13 DIAGNOSIS — H35033 Hypertensive retinopathy, bilateral: Secondary | ICD-10-CM | POA: Diagnosis not present

## 2023-04-13 DIAGNOSIS — H35373 Puckering of macula, bilateral: Secondary | ICD-10-CM

## 2023-04-13 DIAGNOSIS — E103513 Type 1 diabetes mellitus with proliferative diabetic retinopathy with macular edema, bilateral: Secondary | ICD-10-CM

## 2023-04-13 DIAGNOSIS — O0992 Supervision of high risk pregnancy, unspecified, second trimester: Secondary | ICD-10-CM

## 2023-04-13 MED ORDER — BEVACIZUMAB CHEMO INJECTION 1.25MG/0.05ML SYRINGE FOR KALEIDOSCOPE
1.2500 mg | INTRAVITREAL | Status: AC | PRN
Start: 2023-04-13 — End: 2023-04-13
  Administered 2023-04-13: 1.25 mg via INTRAVITREAL

## 2023-04-13 NOTE — Progress Notes (Signed)
[facility] - Clinic Note  04/13/2023     CHIEF COMPLAINT Patient presents for Retina Follow Up   HISTORY OF PRESENT ILLNESS: Kristin Clay is a 37 y.o. female who presents to the clinic today for:   HPI     Retina Follow Up   Patient presents with  Diabetic Retinopathy.  This started 6 weeks ago.  Duration of 6 weeks.  Since onset it is stable.  I, the attending physician,  performed the HPI with the patient and updated documentation appropriately.        Comments   6 week retina follow up PDR pt is reporting vision seems better she is reporting high blood sugar reading with the last one 360 yesterday she ran out of insulin       Last edited by Rennis Chris, MD on 04/13/2023 11:40 AM.    Pt states vision is doing good, pt states her blood sugar is "very high"   Referring physician: Aura Camps MD [address] [place], [place] [address]  HISTORICAL INFORMATION:   Selected notes from the MEDICAL RECORD NUMBER Referred by Dr. Karleen Hampshire for diabetic retinal evaluation LEE:  Ocular Hx- PMH-pregnancy, DM    CURRENT MEDICATIONS: No current outpatient medications on file. (Ophthalmic Drugs)   No current facility-administered medications for this visit. (Ophthalmic Drugs)   Current Outpatient Medications (Other)  Medication Sig   Continuous Blood Gluc Sensor (DEXCOM G6 SENSOR) MISC 1 Device by Does not apply route as directed. (Patient not taking: Reported on 03/24/2023)   Continuous Blood Gluc Transmit (DEXCOM G6 TRANSMITTER) MISC 1 Device by Does not apply route as directed. (Patient not taking: Reported on 03/24/2023)   insulin aspart (NOVOLOG) 100 UNIT/ML injection Max daily 50 units   labetalol (NORMODYNE) 300 MG tablet Take 1 tablet (300 mg total) by mouth 2 (two) times daily.   NIFEdipine (ADALAT CC) 60 MG 24 hr tablet Take 1 tablet (60 mg total) by mouth 2 (two) times daily.   norethindrone (MICRONOR) 0.35 MG tablet Take  1 tablet (0.35 mg total) by mouth daily.   No current facility-administered medications for this visit. (Other)   REVIEW OF SYSTEMS: ROS   Positive for: Endocrine, Eyes Negative for: Constitutional, Gastrointestinal, Neurological, Skin, Genitourinary, Musculoskeletal, HENT, Cardiovascular, Respiratory, Psychiatric, Allergic/Imm, Heme/Lymph Last edited by Etheleen Mayhew, COT on 04/13/2023 10:06 AM.     ALLERGIES Allergies  Allergen Reactions   Pork-Derived Products    PAST MEDICAL HISTORY Past Medical History:  Diagnosis Date   Chronic hypertension    Complication of anesthesia    with hand surgery, local anes did not work, tried twice- had to put her to sleep   Diabetic retinopathy (HCC)    DKA (diabetic ketoacidosis) (HCC) 10/2020   Epidural hematoma (HCC) 07/04/2021   Obstetric vaginal laceration with type 3c third degree perineal laceration 06/29/2021   Pyelonephritis 10/2020   Rh negative status during pregnancy 05/01/2021   s/p Rhogam 05/13/21   Type I diabetes mellitus (HCC)    dx 25yrs ago   UTI (urinary tract infection)    Past Surgical History:  Procedure Laterality Date   CESAREAN SECTION  05/16/2022   Procedure: CESAREAN SECTION;  Surgeon: Warden Fillers, MD;  Location: [facility] LD [facility]S;  Service: Obstetrics;;   female circumcision Bilateral    clitorectomy as well   FOOT SURGERY  2018   HAND SURGERY  2019   FAMILY HISTORY Family History  Problem Relation  Age of Onset   Hyperlipidemia Mother    Hypertension Mother    Kidney disease Father    Alzheimer's disease Father    SOCIAL HISTORY Social History   Tobacco Use   Smoking status: Never   Smokeless tobacco: Never  Vaping Use   Vaping Use: Never used  Substance Use Topics   Alcohol use: Never   Drug use: Never       OPHTHALMIC EXAM:  Base Eye Exam     Visual Acuity (Snellen - Linear)       Right Left   Dist [place] 20/30 -2 20/20   Dist ph [place] 20/25          Tonometry (Tonopen, 10:11  AM)       Right Left   Pressure 20 22         Pupils       Pupils Dark Light Shape React APD   Right PERRL 3 2 Round Brisk None   Left PERRL 3 2 Round Brisk None         Visual Fields       Left Right    Full Full         Extraocular Movement       Right Left    Full, Ortho Full, Ortho         Neuro/Psych     Oriented x3: Yes   Mood/Affect: Normal         Dilation     Both eyes: 2.5% Phenylephrine @ 10:11 AM           Slit Lamp and Fundus Exam     Slit Lamp Exam       Right Left   Lids/Lashes normal normal   Conjunctiva/Sclera mild melanosis, Trace Injection, +corkscrew vessels nasally mild melanosis   Cornea trace PEE Clear   Anterior Chamber deep, clear, narrow temporal angle deep and quiet   Iris round and dilated, no NVI round and dilated, no NVI   Lens 1+Cortical changes Clear   Anterior Vitreous syneresis syneresis         Fundus Exam       Right Left   Disc pink and sharp; compact; mild hyperemia; +fibrosis, fine NVD -- regressing; +PPP pink and sharp; compact; +fibrosis w/ fine NVD -- regressing   C/D Ratio 0.2 0.2   Macula good foveal reflex, scattered MA/DBH, punctate exudates and cystic changes temporal macula, fine NVE inferior and temporal macula - regressing, ERM with striae nasally good foveal reflex, ERM with striae, scattered MA/DBH greatest temporal macula -- improving, exudates temporally, scattered fibrosis and NV, preretinal heme IT macula   Vessels attenuated, Tortuous, early NVE greatest inferior arcades -- ?regressing, Copper wiring attenuated, +NVE -- scattered, +fibrosis, Tortuous   Periphery attached, 360 MA/DBH, good 360 PRP laser changes and posterior fill in -- some room for fill in, scattered NV -- regressing attached, 360 MA/DBH, good 360 PRP with room for fill in, scattered NV -- regressing           IMAGING AND PROCEDURES  Imaging and Procedures for 04/13/2023  OCT, Retina - OU - Both Eyes        Right Eye Quality was good. Central Foveal Thickness: 242. Progression has been stable. Findings include normal foveal contour, no SRF, intraretinal hyper-reflective material, intraretinal fluid, vitreomacular adhesion , preretinal fibrosis (persistent IRF/IRHM -- non central cystic changes remain greatest IT macula, +PRF overlying disc and inferior macula).   Left Eye Quality was  good. Central Foveal Thickness: 253. Progression has worsened. Findings include normal foveal contour, no IRF, no SRF, epiretinal membrane, macular pucker, vitreous traction, preretinal fibrosis (Mild cystic changes inferior macula -- stably improved, pre retinal fibrosis overlying disc extending to macula w/ mild traction -- stable, pre retinal hyper reflective material IT macula -- increased).   Notes *Images captured and stored on drive  Diagnosis / Impression:  PDR w/ DME OU OD: persistent IRF/IRHM -- non central cystic changes remain greatest IT macula, +PRF overlying disc and inferior macula OS: Mild cystic changes inferior macula -- stably improved, pre retinal fibrosis overlying disc extending to macula w/ mild traction -- stable, pre retinal hyper reflective material IT macula -- increased  Clinical management:  See below  Abbreviations: NFP - Normal foveal profile. CME - cystoid macular edema. PED - pigment epithelial detachment. IRF - intraretinal fluid. SRF - subretinal fluid. EZ - ellipsoid zone. ERM - epiretinal membrane. ORA - outer retinal atrophy. ORT - outer retinal tubulation. SRHM - subretinal hyper-reflective material. IRHM - intraretinal hyper-reflective material      Intravitreal Injection, Pharmacologic Agent - OD - Right Eye       Time Out 04/13/2023. 10:42 AM. Confirmed correct patient, procedure, site, and patient consented.   Anesthesia Topical anesthesia was used. Anesthetic medications included Lidocaine 2%, Proparacaine 0.5%.   Procedure Preparation included 5% betadine to  ocular surface, eyelid speculum. A supplied needle was used.   Injection: 1.25 mg Bevacizumab 1.25mg /0.55ml   Route: Intravitreal, Site: Right Eye   NDC: P3213405, Lot: [id number], Expiration date: 05/17/2023   Post-op Post injection exam found visual acuity of at least counting fingers. The patient tolerated the procedure well. There were no complications. The patient received written and verbal post procedure care education.      Intravitreal Injection, Pharmacologic Agent - OS - Left Eye       Time Out 04/13/2023. 10:43 AM. Confirmed correct patient, procedure, site, and patient consented.   Anesthesia Topical anesthesia was used. Anesthetic medications included Lidocaine 2%, Proparacaine 0.5%.   Procedure Preparation included 5% betadine to ocular surface, eyelid speculum. A (32g) needle was used.   Injection: 1.25 mg Bevacizumab 1.25mg /0.88ml   Route: Intravitreal, Site: Left Eye   NDC: P3213405, Lot: [id number], Expiration date: 06/17/2023   Post-op Post injection exam found visual acuity of at least counting fingers. The patient tolerated the procedure well. There were no complications. The patient received written and verbal post procedure care education. Post injection medications were not given.            ASSESSMENT/PLAN:    ICD-10-CM   1. 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 Current use of insulin (HCC)  Z79.4     3. Epiretinal membrane (ERM) of both eyes  H35.373     4. Essential hypertension  I10     5. Hypertensive retinopathy of both eyes  H35.033     6. High-risk pregnancy in second trimester  O09.92      1-3. Proliferative  diabetic retinopathy w/ DME and ERM, both eyes  - last A1c 10.9 on 02.16.24  - delayed to follow up  from 4 weeks to 9 weeks (02.05.24 to 04.09.24)  - s/p PRP OD (03.27.23), fill in (05.15.23)  - s/p PRP OS (04.17.23), fill in (10.20.23)  - s/p IVA OS #1 (12.08.23), #2 (01.08.24), #3 (04.09.24)  - s/p IVA OD #1 (02.05.24), #2 (04.09.24) - last A1c 14.5 on (09/01/22), 7.4 on (06.06.23), 8.4 on (04.06.23) 10.4 on (01.23.23) - BCVA 20/25 OD, 20/20 OS -- stable - FA 10.20.23 shows 360 midzonal NVE w/ leakage OU - OCT shows OD: interval improvement in IRF/IRHM -- non central cystic changes remain greatest IT macula, +PRF overlying disc and inferior macula; OS: Mild cystic changes inferior macula -- stably improved, pre retinal fibrosis overlying disc extending to macula w/ mild traction -- stable, pre retinal hyper reflective material IT macula -- increased at 6 weeks - recommend IVA OU (OD #3 and OS #4) today, 05.21.24 with follow up back to 4 weeks - pt wishes to proceed with injection - RBA of procedure discussed, questions answered - IVA informed consent obtained and signed, 12.08.23 (OU) - see procedure note - f/u 4 weeks -- DFE/OCT, possible injection  4-6. History of high risk pregnancy w/ +HTN and hypertensive retinopathy  - s/p Cesarean 06.24.23 (baby boy) - 33 weeks and 1 day, 3lbs, now out of the NICU and at home - history of pre-eclampsia - BP improved since delivery - discussed importance of tight BP control  - continue to monitor  Ophthalmic Meds Ordered this visit:  Meds ordered this encounter  Medications   Bevacizumab (AVASTIN) SOLN 1.25 mg   Bevacizumab (AVASTIN) SOLN 1.25 mg     Return in about 4 weeks (around 05/11/2023) for f/u PDR OU, DFE, OCT.  There are no Patient Instructions on file for this visit.  Explained the diagnoses, plan, and follow up with the patient and they expressed understanding.  Patient expressed understanding of the importance of proper follow  up care.   This document serves as a record of services personally performed by Karie Chimera, MD, PhD. It was created on their behalf by Glee Arvin. Manson Passey, OA an ophthalmic technician. The creation of this record is the provider's dictation and/or activities during the visit.    Electronically signed by: Glee Arvin. Manson Passey, [place] 05.21.2024 12:01 PM   Karie Chimera, M.D., Ph.D. Diseases & Surgery of the Retina and Vitreous [facility]  I have reviewed the above documentation for accuracy and completeness, and I agree with the above. Karie Chimera, M.D., Ph.D. 04/13/23 12:03 PM    Abbreviations: M myopia (nearsighted); A astigmatism; H hyperopia (farsighted); P presbyopia; Mrx spectacle prescription;  CTL contact lenses; OD right eye; OS left eye; OU both eyes  XT exotropia; ET esotropia; PEK punctate epithelial keratitis; PEE punctate epithelial erosions; DES dry eye syndrome; MGD meibomian gland dysfunction; ATs artificial tears; PFAT's preservative free artificial tears; NSC nuclear sclerotic cataract; PSC posterior subcapsular cataract; ERM epi-retinal membrane; PVD posterior vitreous detachment; RD retinal detachment; DM diabetes mellitus; DR diabetic retinopathy; NPDR non-proliferative diabetic retinopathy; PDR proliferative diabetic retinopathy; CSME clinically significant macular edema; DME diabetic macular edema; dbh dot blot hemorrhages; CWS cotton wool spot; POAG primary open angle glaucoma; C/D cup-to-disc ratio; HVF humphrey visual field; GVF goldmann visual field; OCT optical coherence tomography; IOP intraocular pressure; BRVO Branch retinal vein occlusion; CRVO central retinal vein occlusion; CRAO central retinal artery occlusion; BRAO branch retinal artery occlusion; RT retinal tear; SB scleral buckle; PPV pars plana vitrectomy; VH Vitreous hemorrhage; PRP panretinal laser  photocoagulation; IVK intravitreal kenalog; VMT vitreomacular traction; MH Macular hole;   NVD neovascularization of the disc; NVE neovascularization elsewhere; AREDS age related eye disease study; ARMD age related macular degeneration; POAG primary open angle glaucoma; EBMD epithelial/anterior basement membrane dystrophy; ACIOL anterior chamber intraocular lens; IOL intraocular lens; PCIOL posterior chamber intraocular lens; Phaco/IOL phacoemulsification with intraocular lens placement; PRK photorefractive keratectomy; LASIK laser assisted in situ keratomileusis; HTN hypertension; DM diabetes mellitus; COPD chronic obstructive pulmonary disease

## 2023-04-17 ENCOUNTER — Telehealth: Payer: Self-pay

## 2023-04-17 ENCOUNTER — Other Ambulatory Visit (HOSPITAL_COMMUNITY): Payer: Self-pay

## 2023-04-17 NOTE — Telephone Encounter (Signed)
Patient Advocate Encounter   Received notification from [facility] that prior authorization is required for Dexcom G6 supplies  Submitted: 04/17/23 Sensor Key [id number] Transmitter Key [id number]  Status is APPROVED   Effective through 10/18/2023

## 2023-05-18 NOTE — Progress Notes (Signed)
[facility] - Clinic Note  05/24/2023     CHIEF COMPLAINT Patient presents for Retina Follow Up   HISTORY OF PRESENT ILLNESS: Kristin Clay is a 37 y.o. female who presents to the clinic today for:   HPI     Retina Follow Up   Patient presents with  Diabetic Retinopathy.  In both eyes.  This started 4 weeks ago.  I, the attending physician,  performed the HPI with the patient and updated documentation appropriately.        Comments   Patient here for 4 weeks retina follow up for PDR OU. Patient states vision doing good. No eye pain. Not using any drops.      Last edited by Rennis Chris, MD on 05/24/2023 12:07 PM.     Pt states vision has improved   Referring physician: Aura Camps MD [address] [place], [place] [address]  HISTORICAL INFORMATION:   Selected notes from the MEDICAL RECORD NUMBER Referred by Dr. Karleen Hampshire for diabetic retinal evaluation LEE:  Ocular Hx- PMH-pregnancy, DM    CURRENT MEDICATIONS: No current outpatient medications on file. (Ophthalmic Drugs)   No current facility-administered medications for this visit. (Ophthalmic Drugs)   Current Outpatient Medications (Other)  Medication Sig   insulin aspart (NOVOLOG) 100 UNIT/ML injection Max daily 50 units   labetalol (NORMODYNE) 300 MG tablet Take 1 tablet (300 mg total) by mouth 2 (two) times daily.   NIFEdipine (ADALAT CC) 60 MG 24 hr tablet Take 1 tablet (60 mg total) by mouth 2 (two) times daily.   norethindrone (MICRONOR) 0.35 MG tablet Take 1 tablet (0.35 mg total) by mouth daily.   Continuous Blood Gluc Sensor (DEXCOM G6 SENSOR) MISC 1 Device by Does not apply route as directed. (Patient not taking: Reported on 03/24/2023)   Continuous Blood Gluc Transmit (DEXCOM G6 TRANSMITTER) MISC 1 Device by Does not apply route as directed. (Patient not taking: Reported on 03/24/2023)   No current facility-administered medications for this visit. (Other)    REVIEW OF SYSTEMS: ROS   Positive for: Endocrine, Eyes Negative for: Constitutional, Gastrointestinal, Neurological, Skin, Genitourinary, Musculoskeletal, HENT, Cardiovascular, Respiratory, Psychiatric, Allergic/Imm, Heme/Lymph Last edited by Laddie Aquas, COA on 05/24/2023  9:14 AM.      ALLERGIES Allergies  Allergen Reactions   Pork-Derived Products    PAST MEDICAL HISTORY Past Medical History:  Diagnosis Date   Chronic hypertension    Complication of anesthesia    with hand surgery, local anes did not work, tried twice- had to put her to sleep   Diabetic retinopathy (HCC)    DKA (diabetic ketoacidosis) (HCC) 10/2020   Epidural hematoma (HCC) 07/04/2021   Obstetric vaginal laceration with type 3c third degree perineal laceration 06/29/2021   Pyelonephritis 10/2020   Rh negative status during pregnancy 05/01/2021   s/p Rhogam 05/13/21   Type I diabetes mellitus (HCC)    dx 1yrs ago   UTI (urinary tract infection)    Past Surgical History:  Procedure Laterality Date   CESAREAN SECTION  05/16/2022   Procedure: CESAREAN SECTION;  Surgeon: Warden Fillers, MD;  Location: [facility];  Service: Obstetrics;;   female circumcision Bilateral    clitorectomy as well   FOOT SURGERY  2018   HAND SURGERY  2019   FAMILY HISTORY Family History  Problem Relation Age of Onset   Hyperlipidemia Mother    Hypertension Mother    Kidney disease Father  Alzheimer's disease Father    SOCIAL HISTORY Social History   Tobacco Use   Smoking status: Never   Smokeless tobacco: Never  Vaping Use   Vaping Use: Never used  Substance Use Topics   Alcohol use: Never   Drug use: Never       OPHTHALMIC EXAM:  Base Eye Exam     Visual Acuity (Snellen - Linear)       Right Left   Dist [place] 20/25 -1 20/20   Dist ph [place] 20/20 -2          Tonometry (Tonopen, 9:13 AM)       Right Left   Pressure 17 15         Pupils       Dark Light Shape React APD   Right 3 2 Round  Brisk None   Left 3 2 Round Brisk None         Visual Fields (Counting fingers)       Left Right    Full Full         Extraocular Movement       Right Left    Full, Ortho Full, Ortho         Neuro/Psych     Oriented x3: Yes   Mood/Affect: Normal         Dilation     Both eyes: 1.0% Mydriacyl, 2.5% Phenylephrine @ 9:13 AM           Slit Lamp and Fundus Exam     Slit Lamp Exam       Right Left   Lids/Lashes normal normal   Conjunctiva/Sclera mild melanosis, Trace Injection, +corkscrew vessels nasally mild melanosis   Cornea trace PEE Clear   Anterior Chamber deep, clear, narrow temporal angle deep and quiet   Iris round and dilated, no NVI round and dilated, no NVI   Lens 1+Cortical changes Clear   Anterior Vitreous syneresis syneresis         Fundus Exam       Right Left   Disc pink and sharp; compact; mild hyperemia; +fibrosis, fine NVD -- regressing; +PPP pink and sharp; compact; +fibrosis w/ fine NVD -- regressing   C/D Ratio 0.2 0.2   Macula good foveal reflex, scattered MA/DBH, punctate exudates and cystic changes temporal macula, fine NVE inferior and temporal macula - regressing, ERM with striae nasally good foveal reflex, ERM with striae, scattered MA/DBH greatest temporal macula -- improving, exudates temporally, scattered fibrosis and NV, preretinal heme IT macula -- improving   Vessels attenuated, Tortuous, early NVE greatest inferior arcades -- ?regressing, Copper wiring attenuated, +NVE -- scattered, +fibrosis, Tortuous   Periphery attached, 360 MA/DBH, good 360 PRP laser changes and posterior fill in -- some room for fill in, scattered NV -- regressing attached, 360 MA/DBH, good 360 PRP with room for fill in, scattered NV -- regressing           IMAGING AND PROCEDURES  Imaging and Procedures for 05/24/2023  OCT, Retina - OU - Both Eyes       Right Eye Quality was good. Central Foveal Thickness: 243. Progression has improved.  Findings include normal foveal contour, no SRF, intraretinal hyper-reflective material, intraretinal fluid, vitreomacular adhesion , preretinal fibrosis (Mild interval improvement in IRF/IRHM IT macula, +PRF overlying disc and inferior macula).   Left Eye Quality was good. Central Foveal Thickness: 256. Progression has improved. Findings include normal foveal contour, no IRF, no SRF, epiretinal membrane, macular pucker, vitreous  traction, preretinal fibrosis (Mild cystic changes inferior macula -- stably improved, pre retinal fibrosis overlying disc extending to macula w/ mild traction -- stable, mild improvement in pre retinal hyper reflective material IT macula ).   Notes *Images captured and stored on drive  Diagnosis / Impression:  PDR w/ DME OU OD: Mild interval improvement in IRF/IRHM IT macula, +PRF overlying disc and inferior macula OS: Mild cystic changes inferior macula -- stably improved, pre retinal fibrosis overlying disc extending to macula w/ mild traction -- stable, mild improvement in pre retinal hyper reflective material IT macula   Clinical management:  See below  Abbreviations: NFP - Normal foveal profile. CME - cystoid macular edema. PED - pigment epithelial detachment. IRF - intraretinal fluid. SRF - subretinal fluid. EZ - ellipsoid zone. ERM - epiretinal membrane. ORA - outer retinal atrophy. ORT - outer retinal tubulation. SRHM - subretinal hyper-reflective material. IRHM - intraretinal hyper-reflective material      Intravitreal Injection, Pharmacologic Agent - OD - Right Eye       Time Out 05/24/2023. 9:45 AM. Confirmed correct patient, procedure, site, and patient consented.   Anesthesia Topical anesthesia was used. Anesthetic medications included Lidocaine 2%, Proparacaine 0.5%.   Procedure Preparation included 5% betadine to ocular surface, eyelid speculum. A supplied needle was used.   Injection: 1.25 mg Bevacizumab 1.25mg /0.61ml   Route: Intravitreal,  Site: Right Eye   NDC: P3213405, Lot: [id number], Expiration date: 07/05/2023   Post-op Post injection exam found visual acuity of at least counting fingers. The patient tolerated the procedure well. There were no complications. The patient received written and verbal post procedure care education.      Intravitreal Injection, Pharmacologic Agent - OS - Left Eye       Time Out 05/24/2023. 9:45 AM. Confirmed correct patient, procedure, site, and patient consented.   Anesthesia Topical anesthesia was used. Anesthetic medications included Lidocaine 2%, Proparacaine 0.5%.   Procedure Preparation included 5% betadine to ocular surface, eyelid speculum. A (32g) needle was used.   Injection: 1.25 mg Bevacizumab 1.25mg /0.56ml   Route: Intravitreal, Site: Left Eye   NDC: P3213405, Lot: [id number], Expiration date: 08/20/2023   Post-op Post injection exam found visual acuity of at least counting fingers. The patient tolerated the procedure well. There were no complications. The patient received written and verbal post procedure care education. Post injection medications were not given.             ASSESSMENT/PLAN:    ICD-10-CM   1. 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 Current use of insulin (HCC)  Z79.4     3. Epiretinal membrane (ERM) of both eyes  H35.373     4. Essential hypertension  I10     5. Hypertensive retinopathy of both eyes  H35.033     6. High-risk pregnancy in second trimester  O09.92      1-3. 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, both eyes  - last A1c 10.9 on 02.16.24  - delayed to follow up from 4 weeks to 9 weeks (02.05.24  to 04.09.24)  - s/p PRP OD (03.27.23), fill in (05.15.23)  - s/p  PRP OS (04.17.23), fill in (10.20.23)  - s/p IVA OS #1 (12.08.23), #2 (01.08.24), #3 (04.09.24), #4 (05.21.24)  - s/p IVA OD #1 (02.05.24), #2 (04.09.24), #3 (05.21.24) - last A1c 14.5 on (09/01/22), 7.4 on (06.06.23), 8.4 on (04.06.23) 10.4 on (01.23.23) - BCVA 20/25 OD, 20/20 OS -- stable - FA 10.20.23 shows 360 midzonal NVE w/ leakage OU - OCT shows OD: Mild interval improvement in IRF/IRHM IT macula, +PRF overlying disc and inferior macula; OS: Mild cystic changes inferior macula -- stably improved, pre retinal fibrosis overlying disc extending to macula w/ mild traction -- stable, mild improvement in pre retinal hyper reflective material IT macula at 6 weeks - recommend IVA OU (OD #4 and OS #5) today, 07.01.24 with follow up extended to 6-8 weeks - pt wishes to proceed with injection - RBA of procedure discussed, questions answered - IVA informed consent obtained and signed, 12.08.23 (OU) - see procedure note - approved for Eylea for 2024 - f/u 6-8 weeks -- DFE/OCT, possible injection  4-6. History of high risk pregnancy w/ +HTN and hypertensive retinopathy  - s/p Cesarean 06.24.23 (baby boy) - 33 weeks and 1 day, 3lbs, now out of the NICU and at home - history of pre-eclampsia - BP improved since delivery - discussed importance of tight BP control  - continue to monitor  Ophthalmic Meds Ordered this visit:  Meds ordered this encounter  Medications   Bevacizumab (AVASTIN) SOLN 1.25 mg   Bevacizumab (AVASTIN) SOLN 1.25 mg     Return for f/u 6-8 weeks, PDR OU, DFE, OCT.  There are no Patient Instructions on file for this visit.  Explained the diagnoses, plan, and follow up with the patient and they expressed understanding.  Patient expressed understanding of the importance of proper follow up care.   This document serves as a record of services personally performed by Karie Chimera, MD, PhD. It was created on  their behalf by Glee Arvin. Manson Passey, OA an ophthalmic technician. The creation of this record is the provider's dictation and/or activities during the visit.    Electronically signed by: Glee Arvin. Manson Passey, [place] 06.25.2024 12:15 PM  Karie Chimera, M.D., Ph.D. Diseases & Surgery of the Retina and Vitreous [facility]  I have reviewed the above documentation for accuracy and completeness, and I agree with the above. Karie Chimera, M.D., Ph.D. 05/24/23 12:16 PM  Abbreviations: M myopia (nearsighted); A astigmatism; H hyperopia (farsighted); P presbyopia; Mrx spectacle prescription;  CTL contact lenses; OD right eye; OS left eye; OU both eyes  XT exotropia; ET esotropia; PEK punctate epithelial keratitis; PEE punctate epithelial erosions; DES dry eye syndrome; MGD meibomian gland dysfunction; ATs artificial tears; PFAT's preservative free artificial tears; NSC nuclear sclerotic cataract; PSC posterior subcapsular cataract; ERM epi-retinal membrane; PVD posterior vitreous detachment; RD retinal detachment; DM diabetes mellitus; DR diabetic retinopathy; NPDR non-proliferative diabetic retinopathy; PDR proliferative diabetic retinopathy; CSME clinically significant macular edema; DME diabetic macular edema; dbh dot blot hemorrhages; CWS cotton wool spot; POAG primary open angle glaucoma; C/D cup-to-disc ratio; HVF humphrey visual field; GVF goldmann visual field; OCT optical coherence tomography; IOP intraocular pressure; BRVO Branch retinal vein occlusion; CRVO central retinal vein occlusion; CRAO central retinal artery occlusion; BRAO branch retinal artery occlusion; RT retinal tear; SB scleral buckle; PPV pars plana vitrectomy; VH Vitreous hemorrhage; PRP panretinal laser photocoagulation; IVK intravitreal kenalog; VMT vitreomacular traction; MH Macular hole;  NVD neovascularization of the disc; NVE neovascularization elsewhere; AREDS age related eye  disease study; ARMD age related macular  degeneration; POAG primary open angle glaucoma; EBMD epithelial/anterior basement membrane dystrophy; ACIOL anterior chamber intraocular lens; IOL intraocular lens; PCIOL posterior chamber intraocular lens; Phaco/IOL phacoemulsification with intraocular lens placement; PRK photorefractive keratectomy; LASIK laser assisted in situ keratomileusis; HTN hypertension; DM diabetes mellitus; COPD chronic obstructive pulmonary disease

## 2023-05-21 ENCOUNTER — Encounter (INDEPENDENT_AMBULATORY_CARE_PROVIDER_SITE_OTHER): Payer: Medicaid Other | Admitting: Ophthalmology

## 2023-05-24 ENCOUNTER — Encounter (INDEPENDENT_AMBULATORY_CARE_PROVIDER_SITE_OTHER): Payer: Self-pay | Admitting: Ophthalmology

## 2023-05-24 ENCOUNTER — Ambulatory Visit (INDEPENDENT_AMBULATORY_CARE_PROVIDER_SITE_OTHER): Payer: Medicaid Other | Admitting: Ophthalmology

## 2023-05-24 DIAGNOSIS — O0992 Supervision of high risk pregnancy, unspecified, second trimester: Secondary | ICD-10-CM

## 2023-05-24 DIAGNOSIS — I10 Essential (primary) hypertension: Secondary | ICD-10-CM | POA: Diagnosis not present

## 2023-05-24 DIAGNOSIS — E103513 Type 1 diabetes mellitus with proliferative diabetic retinopathy with macular edema, bilateral: Secondary | ICD-10-CM

## 2023-05-24 DIAGNOSIS — Z794 Long term (current) use of insulin: Secondary | ICD-10-CM

## 2023-05-24 DIAGNOSIS — H35033 Hypertensive retinopathy, bilateral: Secondary | ICD-10-CM | POA: Diagnosis not present

## 2023-05-24 DIAGNOSIS — H35373 Puckering of macula, bilateral: Secondary | ICD-10-CM

## 2023-05-24 MED ORDER — BEVACIZUMAB CHEMO INJECTION 1.25MG/0.05ML SYRINGE FOR KALEIDOSCOPE
1.2500 mg | INTRAVITREAL | Status: AC | PRN
Start: 2023-05-24 — End: 2023-05-24
  Administered 2023-05-24: 1.25 mg via INTRAVITREAL

## 2023-06-17 ENCOUNTER — Encounter: Payer: Self-pay | Admitting: Family Medicine

## 2023-06-17 ENCOUNTER — Ambulatory Visit (INDEPENDENT_AMBULATORY_CARE_PROVIDER_SITE_OTHER): Payer: Medicaid Other | Admitting: Family Medicine

## 2023-06-17 VITALS — BP 104/73 | HR 87 | Temp 98.1°F | Ht 62.0 in | Wt 115.4 lb

## 2023-06-17 DIAGNOSIS — Z789 Other specified health status: Secondary | ICD-10-CM | POA: Diagnosis not present

## 2023-06-17 DIAGNOSIS — Z794 Long term (current) use of insulin: Secondary | ICD-10-CM | POA: Diagnosis not present

## 2023-06-17 DIAGNOSIS — B3731 Acute candidiasis of vulva and vagina: Secondary | ICD-10-CM | POA: Diagnosis not present

## 2023-06-17 DIAGNOSIS — E11649 Type 2 diabetes mellitus with hypoglycemia without coma: Secondary | ICD-10-CM

## 2023-06-17 MED ORDER — LANTUS SOLOSTAR 100 UNIT/ML ~~LOC~~ SOPN: 30.0000 [IU] | PEN_INJECTOR | Freq: Every day | SUBCUTANEOUS | 0 refills | Status: DC

## 2023-06-17 MED ORDER — FLUCONAZOLE 150 MG PO TABS: 150.0000 mg | ORAL_TABLET | Freq: Once | ORAL | 0 refills | Status: AC

## 2023-06-17 MED ORDER — INSULIN PEN NEEDLE 31G X 5 MM MISC: 1.0000 | Freq: Every day | 5 refills | Status: DC

## 2023-06-17 NOTE — Progress Notes (Signed)
Patient is here for their 6 month follow-up Patient has no concerns today Care gaps have been discussed with patient  

## 2023-06-18 ENCOUNTER — Other Ambulatory Visit: Payer: Self-pay | Admitting: Family Medicine

## 2023-06-18 DIAGNOSIS — N921 Excessive and frequent menstruation with irregular cycle: Secondary | ICD-10-CM

## 2023-06-18 DIAGNOSIS — Z30011 Encounter for initial prescription of contraceptive pills: Secondary | ICD-10-CM

## 2023-06-22 ENCOUNTER — Other Ambulatory Visit: Payer: Self-pay

## 2023-06-22 ENCOUNTER — Encounter: Payer: Self-pay | Admitting: Pharmacist

## 2023-06-22 ENCOUNTER — Ambulatory Visit: Payer: Medicaid Other | Attending: Family Medicine | Admitting: Pharmacist

## 2023-06-22 DIAGNOSIS — Z794 Long term (current) use of insulin: Secondary | ICD-10-CM | POA: Diagnosis not present

## 2023-06-22 DIAGNOSIS — E10649 Type 1 diabetes mellitus with hypoglycemia without coma: Secondary | ICD-10-CM | POA: Diagnosis not present

## 2023-06-22 MED ORDER — DEXCOM G7 SENSOR MISC
6 refills | Status: DC
Start: 2023-06-22 — End: 2023-07-23
  Filled 2023-06-22: qty 3, 30d supply, fill #0

## 2023-06-22 MED ORDER — NOVOLOG FLEXPEN 100 UNIT/ML ~~LOC~~ SOPN
12.0000 [IU] | PEN_INJECTOR | Freq: Three times a day (TID) | SUBCUTANEOUS | 2 refills | Status: DC
Start: 2023-06-22 — End: 2023-08-31
  Filled 2023-06-22: qty 15, 42d supply, fill #0

## 2023-06-22 MED ORDER — DEXCOM G7 RECEIVER DEVI
0 refills | Status: DC
  Filled 2023-06-22: qty 1, 30d supply, fill #0

## 2023-06-22 NOTE — Progress Notes (Signed)
Established Patient Office Visit  Subjective    Patient ID: hope rosendo, female    DOB: [birth year]  Age: 37 y.o. MRN: [id number]  CC: No chief complaint on file.   HPI Kristin Clay presents for follow up of chronic med issues. Patient reports some vaginal discharge and itching. This visit was aided by an interpreter.    Outpatient Encounter Medications as of 06/17/2023  Medication Sig   aspirin 81 MG chewable tablet Chew by mouth.   [EXPIRED] fluconazole (DIFLUCAN) 150 MG tablet Take 1 tablet (150 mg total) by mouth once for 1 dose.   Insulin Pen Needle 31G X 5 MM MISC 1 each by Does not apply route at bedtime.   labetalol (NORMODYNE) 300 MG tablet Take 1 tablet (300 mg total) by mouth 2 (two) times daily.   lisinopril (ZESTRIL) 10 MG tablet Take by mouth.   NIFEdipine (ADALAT CC) 60 MG 24 hr tablet Take 1 tablet (60 mg total) by mouth 2 (two) times daily.   norethindrone (MICRONOR) 0.35 MG tablet Take 1 tablet (0.35 mg total) by mouth daily.   [DISCONTINUED] insulin aspart (NOVOLOG) 100 UNIT/ML injection Max daily 50 units   [DISCONTINUED] insulin glargine (LANTUS SOLOSTAR) 100 UNIT/ML Solostar Pen Inject into the skin.   insulin glargine (LANTUS SOLOSTAR) 100 UNIT/ML Solostar Pen Inject 30 Units into the skin daily.   [DISCONTINUED] Continuous Blood Gluc Sensor (DEXCOM G6 SENSOR) MISC 1 Device by Does not apply route as directed. (Patient not taking: Reported on 03/24/2023)   [DISCONTINUED] Continuous Blood Gluc Transmit (DEXCOM G6 TRANSMITTER) MISC 1 Device by Does not apply route as directed. (Patient not taking: Reported on 03/24/2023)   [DISCONTINUED] insulin detemir (LEVEMIR) 100 UNIT/ML injection Inject 20-40 Units into the skin See admin instructions. 40 units every morning and 20 units every night (Patient not taking: Reported on 04/11/2021)   No facility-administered encounter medications on file as of 06/17/2023.    Past Medical History:  Diagnosis  Date   Chronic hypertension    Complication of anesthesia    with hand surgery, local anes did not work, tried twice- had to put her to sleep   Diabetic retinopathy (HCC)    DKA (diabetic ketoacidosis) (HCC) 10/2020   Epidural hematoma (HCC) 07/04/2021   Obstetric vaginal laceration with type 3c third degree perineal laceration 06/29/2021   Pyelonephritis 10/2020   Rh negative status during pregnancy 05/01/2021   s/p Rhogam 05/13/21   Type I diabetes mellitus (HCC)    dx 32yrs ago   UTI (urinary tract infection)     Past Surgical History:  Procedure Laterality Date   CESAREAN SECTION  05/16/2022   Procedure: CESAREAN SECTION;  Surgeon: Warden Fillers, MD;  Location: [facility] LD [facility];  Service: Obstetrics;;   female circumcision Bilateral    clitorectomy as well   FOOT SURGERY  2018   HAND SURGERY  2019    Family History  Problem Relation Age of Onset   Hyperlipidemia Mother    Hypertension Mother    Kidney disease Father    Alzheimer's disease Father     Social History   Socioeconomic History   Marital status: Married    Spouse name: Not on file   Number of children: Not on file   Years of education: Not on file   Highest education level: Not on file  Occupational History   Not on file  Tobacco Use   Smoking status: Never   Smokeless tobacco: Never  Vaping Use   Vaping status: Never Used  Substance and Sexual Activity   Alcohol use: Never   Drug use: Never   Sexual activity: Not Currently    Birth control/protection: None  Other Topics Concern   Not on file  Social History Narrative   ** Merged History Encounter **       ** Merged History Encounter **       Social Determinants of Health   Financial Resource Strain: High Risk (05/30/2021)   Overall Financial Resource Strain (CARDIA)    Difficulty of Paying Living Expenses: Very hard  Food Insecurity: Food Insecurity Present (03/12/2022)   Hunger Vital Sign    Worried About Running Out of Food in the Last  Year: Sometimes true    Ran Out of Food in the Last Year: Sometimes true  Transportation Needs: Unmet Transportation Needs (03/12/2022)   PRAPARE - Administrator, Civil Service (Medical): Yes    Lack of Transportation (Non-Medical): Yes  Physical Activity: Not on file  Stress: Not on file  Social Connections: Unknown (06/10/2023)   [facility] from [facility]   Social Network    Social Network: Not on file  Intimate Partner Violence: Unknown (06/10/2023)   Received from [facility]   HITS    Physically Hurt: Not on file    Insult or Talk Down To: Not on file    Threaten Physical Harm: Not on file    Scream or Curse: Not on file    Review of Systems  All other systems reviewed and are negative.       Objective    BP 104/73   Pulse 87   Temp 98.1 F (36.7 C) (Oral)   Ht 5\' 2"  (1.575 m)   Wt 115 lb 6.4 oz (52.3 kg)   BMI 21.11 kg/m   Physical Exam Vitals and nursing note reviewed.  Constitutional:      General: She is not in acute distress. Cardiovascular:     Rate and Rhythm: Normal rate and regular rhythm.  Pulmonary:     Effort: Pulmonary effort is normal.     Breath sounds: Normal breath sounds.  Abdominal:     Palpations: Abdomen is soft.     Tenderness: There is no abdominal tenderness.  Musculoskeletal:        General: Normal range of motion.  Neurological:     General: No focal deficit present.     Mental Status: She is alert and oriented to person, place, and time.         Assessment & Plan:   1. Uncontrolled type 2 diabetes mellitus with hypoglycemia without coma (HCC) Elevated A1c. Discussed compliance. Meds refilled. Referred to [facility] for med managment - POCT glycosylated hemoglobin (Hb A1C)  2. Yeast vaginitis Diflucan prescribed.   3. Language barrier to communication     Return in about 3 months (around 09/17/2023) for follow up.   Tommie Raymond, MD

## 2023-06-22 NOTE — Progress Notes (Signed)
    S:     Patient arrives in good spirits.  Presents for diabetes evaluation, education, and management. Patient was referred and last seen by Primary Care Provider (Dr. Andrey Campanile) on 06/17/2023.  Patient reports Diabetes is longstanding. Since her last PCP visit, pt reports that her sugars have been elevated due to no Novolog. She was using OmniPod with her Endocrinologist but has not been seen by Endo since Feb. Now, she is only using 30u of Lantus once daily and her home CGM data shows 100% of her readings >300 over the last 24 hours.   Family/Social History:  -Never smoker  Insurance coverage/medication affordability: [employer]  Medication adherence reported to Lantus but she is not taking bolus insulin.  Current diabetes medications include:  -Lantus 30 units daily.   Patient denies hypoglycemia.  Patient reported dietary habits: Eats 1-2 meals/day Patient reports eating pasta, veggies, and meat primarily. Patient was counseled on the plate method and eating consistent meals.  Patient-reported exercise habits: none   Patient denies nocturia (nighttime urination).  Patient reports neuropathy (nerve pain). Patient reports visual changes. Patient reports self foot exams.   O:  Lab Results  Component Value Date   HGBA1C 10.9 (A) 01/08/2023   There were no vitals filed for this visit.  Lipid Panel     Component Value Date/Time   CHOL 179 10/29/2019 0243   TRIG 77 10/29/2019 0243   HDL 35 (L) 10/29/2019 0243   CHOLHDL 5.1 10/29/2019 0243   VLDL 15 10/29/2019 0243   LDLCALC 129 (H) 10/29/2019 0243   No meter with her today. Patient reports checking blood glucose only after breakfast and after dinner with no fasting checks. She reports the following range: 78 - 200. Tells me that most of her levels are in the low 100s-140s since her last visit with me.   Clinical Atherosclerotic Cardiovascular Disease (ASCVD): No  The ASCVD Risk score (Arnett DK, et al., 2019) failed to  calculate for the following reasons:   The 2019 ASCVD risk score is only valid for ages 41 to 68   A/P: Diabetes longstanding currently uncontrolled. Cgm reveals continued hyperglycemia at home. Will have her go downstairs to schedule a follow-up with Dr. Lonzo Cloud. I have sent a new rxn for Decom G7. We will restart Novolog at 12u TID before meals.  -Continue Lantus 30 units daily.  -Restart Humalog 12 units TID before each meal.  -Extensively discussed pathophysiology of diabetes, recommended lifestyle interventions, dietary effects on blood sugar control -Counseled on s/sx of and management of hypoglycemia -Next A1C anticipated 03/2023.   Written patient instructions provided.  Total time in face to face counseling 20  minutes. Follow up with me in 1 month.  Thank you for allowing pharmacy to participate in this patient's care.  Butch Penny, PharmD, Patsy [place], CPP Clinical Pharmacist [facility] [phone]

## 2023-07-01 NOTE — Progress Notes (Signed)
[facility] - Clinic Note  07/05/2023     CHIEF COMPLAINT Patient presents for Retina Follow Up   HISTORY OF PRESENT ILLNESS: Kristin Clay is a 37 y.o. female who presents to the clinic today for:   HPI     Retina Follow Up   Patient presents with  Diabetic Retinopathy.  In both eyes.  This started 6 weeks ago.  Duration of 6 weeks.  Since onset it is stable.  I, the attending physician,  performed the HPI with the patient and updated documentation appropriately.        Comments   6 week retina follow up and IVA OU pt is reporting no vision changes noticed she denies any flashes or floaters her last reading was 150       Last edited by Rennis Chris, MD on 07/05/2023 10:41 AM.     Patient feels the vision is good.    Referring physician: Aura Camps MD [address], [address] [place], [place] [address]  HISTORICAL INFORMATION:   Selected notes from the MEDICAL RECORD NUMBER Referred by Dr. Karleen Hampshire for diabetic retinal evaluation LEE:  Ocular Hx- PMH-pregnancy, DM    CURRENT MEDICATIONS: No current outpatient medications on file. (Ophthalmic Drugs)   No current facility-administered medications for this visit. (Ophthalmic Drugs)   Current Outpatient Medications (Other)  Medication Sig   aspirin 81 MG chewable tablet Chew by mouth.   Continuous Glucose Receiver (DEXCOM G7 RECEIVER) DEVI Use to check blood sugar continuously throughout the day.   Continuous Glucose Sensor (DEXCOM G7 SENSOR) MISC Use to check blood sugar continuously throughout the day. Change sensors once every 10 days.   insulin aspart (NOVOLOG FLEXPEN) 100 UNIT/ML FlexPen Inject 12 Units into the skin 3 (three) times daily with meals.   insulin glargine (LANTUS SOLOSTAR) 100 UNIT/ML Solostar Pen Inject 30 Units into the skin daily.   Insulin Pen Needle 31G X 5 MM MISC 1 each by Does not apply route at bedtime.   labetalol (NORMODYNE) 300 MG tablet Take 1  tablet (300 mg total) by mouth 2 (two) times daily.   lisinopril (ZESTRIL) 10 MG tablet Take by mouth.   NIFEdipine (ADALAT CC) 60 MG 24 hr tablet Take 1 tablet (60 mg total) by mouth 2 (two) times daily.   norethindrone (MICRONOR) 0.35 MG tablet Take 1 tablet (0.35 mg total) by mouth daily.   No current facility-administered medications for this visit. (Other)   REVIEW OF SYSTEMS: ROS   Positive for: Endocrine, Eyes Negative for: Constitutional, Gastrointestinal, Neurological, Skin, Genitourinary, Musculoskeletal, HENT, Cardiovascular, Respiratory, Psychiatric, Allergic/Imm, Heme/Lymph Last edited by Etheleen Mayhew, COT on 07/05/2023  9:32 AM.       ALLERGIES Allergies  Allergen Reactions   Pork-Derived Products    PAST MEDICAL HISTORY Past Medical History:  Diagnosis Date   Chronic hypertension    Complication of anesthesia    with hand surgery, local anes did not work, tried twice- had to put her to sleep   Diabetic retinopathy (HCC)    DKA (diabetic ketoacidosis) (HCC) 10/2020   Epidural hematoma (HCC) 07/04/2021   Obstetric vaginal laceration with type 3c third degree perineal laceration 06/29/2021   Pyelonephritis 10/2020   Rh negative status during pregnancy 05/01/2021   s/p Rhogam 05/13/21   Type I diabetes mellitus (HCC)    dx 70yrs ago   UTI (urinary tract infection)    Past Surgical History:  Procedure Laterality Date   CESAREAN  SECTION  05/16/2022   Procedure: CESAREAN SECTION;  Surgeon: Warden Fillers, MD;  Location: [facility] LD [facility]S;  Service: Obstetrics;;   female circumcision Bilateral    clitorectomy as well   FOOT SURGERY  2018   HAND SURGERY  2019   FAMILY HISTORY Family History  Problem Relation Age of Onset   Hyperlipidemia Mother    Hypertension Mother    Kidney disease Father    Alzheimer's disease Father    SOCIAL HISTORY Social History   Tobacco Use   Smoking status: Never   Smokeless tobacco: Never  Vaping Use   Vaping status:  Never Used  Substance Use Topics   Alcohol use: Never   Drug use: Never       OPHTHALMIC EXAM:  Base Eye Exam     Visual Acuity (Snellen - Linear)       Right Left   Dist [place] 20/40 +1 20/25 -2   Dist ph [place] NI          Tonometry (Tonopen, 9:38 AM)       Right Left   Pressure 15 16         Pupils       Pupils Dark Light Shape React APD   Right PERRL 3 2 Round Brisk None   Left PERRL 3 2 Round Brisk None         Visual Fields       Left Right    Full Full         Extraocular Movement       Right Left    Full Full         Neuro/Psych     Oriented x3: Yes   Mood/Affect: Normal         Dilation     Both eyes: 2.5% Phenylephrine @ 9:38 AM           Slit Lamp and Fundus Exam     Slit Lamp Exam       Right Left   Lids/Lashes normal normal   Conjunctiva/Sclera mild melanosis, Trace Injection, +corkscrew vessels nasally mild melanosis   Cornea trace PEE Clear   Anterior Chamber deep, clear, narrow temporal angle deep and quiet   Iris round and dilated, no NVI round and dilated, no NVI   Lens 1+Cortical changes Clear   Anterior Vitreous syneresis syneresis         Fundus Exam       Right Left   Disc pink and sharp; compact; mild hyperemia; +fibrosis, fine NVD -- regressing; +PPP pink and sharp; compact; +fibrosis w/ fine NVD -- regressing   C/D Ratio 0.2 0.2   Macula good foveal reflex, scattered MA/DBH, punctate exudates and cystic changes temporal macula-slightly increased, fine NVE inferior and temporal macula - regressing, ERM with striae nasally good foveal reflex, ERM with striae, scattered MA/DBH greatest temporal macula -- improving, exudates temporally, scattered fibrosis and NV, preretinal heme IT macula -- improving   Vessels attenuated, Tortuous, early NVE greatest inferior arcades -- ?regressing, Copper wiring attenuated, +NVE -- scattered, +fibrosis, Tortuous   Periphery attached, 360 MA/DBH, good 360 PRP laser changes and  posterior fill in -- some room for fill in, scattered NV -- regressing attached, 360 MA/DBH, good 360 PRP with room for fill in, scattered NV -- regressing           IMAGING AND PROCEDURES  Imaging and Procedures for 07/05/2023  OCT, Retina - OU - Both Eyes  Right Eye Quality was good. Central Foveal Thickness: 240. Progression has worsened. Findings include normal foveal contour, no SRF, intraretinal hyper-reflective material, intraretinal fluid, vitreomacular adhesion , preretinal fibrosis (Mild interval increase in IRF/IRHM IT macula, +PRF overlying disc and inferior macula).   Left Eye Quality was good. Central Foveal Thickness: 259. Progression has improved. Findings include normal foveal contour, no IRF, no SRF, epiretinal membrane, macular pucker, vitreous traction, preretinal fibrosis (Mild cystic changes inferior macula -- stably improved, pre retinal fibrosis overlying disc extending to macula w/ mild traction -- stable, interval improvement in pre retinal hyper reflective material IT macula ).   Notes *Images captured and stored on drive  Diagnosis / Impression:  PDR w/ DME OU OD: Mild interval increase in IRF/IRHM IT macula, +PRF overlying disc and inferior macula OS: Mild cystic changes inferior macula -- stably improved, pre retinal fibrosis overlying disc extending to macula w/ mild traction -- stable, interval improvement in pre retinal hyper reflective material IT macula   Clinical management:  See below  Abbreviations: NFP - Normal foveal profile. CME - cystoid macular edema. PED - pigment epithelial detachment. IRF - intraretinal fluid. SRF - subretinal fluid. EZ - ellipsoid zone. ERM - epiretinal membrane. ORA - outer retinal atrophy. ORT - outer retinal tubulation. SRHM - subretinal hyper-reflective material. IRHM - intraretinal hyper-reflective material      Intravitreal Injection, Pharmacologic Agent - OD - Right Eye       Time Out 07/05/2023. 10:03  AM. Confirmed correct patient, procedure, site, and patient consented.   Anesthesia Topical anesthesia was used. Anesthetic medications included Lidocaine 2%, Proparacaine 0.5%.   Procedure Preparation included 5% betadine to ocular surface, eyelid speculum. A supplied needle was used.   Injection: 1.25 mg Bevacizumab 1.25mg /0.53ml   Route: Intravitreal, Site: Right Eye   NDC: P3213405, Lot: [id number], Expiration date: 07/05/2023   Post-op Post injection exam found visual acuity of at least counting fingers. The patient tolerated the procedure well. There were no complications. The patient received written and verbal post procedure care education.      Intravitreal Injection, Pharmacologic Agent - OS - Left Eye       Time Out 07/05/2023. 10:03 AM. Confirmed correct patient, procedure, site, and patient consented.   Anesthesia Topical anesthesia was used. Anesthetic medications included Lidocaine 2%, Proparacaine 0.5%.   Procedure Preparation included 5% betadine to ocular surface, eyelid speculum. A (32g) needle was used.   Injection: 1.25 mg Bevacizumab 1.25mg /0.61ml   Route: Intravitreal, Site: Left Eye   NDC: P3213405, Lot: [id number], Expiration date: 03/15/2024   Post-op Post injection exam found visual acuity of at least counting fingers. The patient tolerated the procedure well. There were no complications. The patient received written and verbal post procedure care education. Post injection medications were not given.            ASSESSMENT/PLAN:    ICD-10-CM   1. 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 Current use of insulin (HCC)  Z79.4      3. Epiretinal membrane (ERM) of both eyes  H35.373     4. Essential hypertension  I10  5. Hypertensive retinopathy of both eyes  H35.033     6. High-risk pregnancy in second trimester  O09.92      1-3. 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, both eyes - last A1c 10.9 on 02.16.24, 14.5 on (09/01/22), 7.4 on (06.06.23), 8.4 on (04.06.23) 10.4 on (01.23.23)  - delayed to follow up from 4 weeks to 9 weeks (02.05.24 to 04.09.24)  - s/p PRP OD (03.27.23), fill in (05.15.23)  - s/p PRP OS (04.17.23), fill in (10.20.23) - s/p IVA OS #1 (12.08.23), #2 (01.08.24), #3 (04.09.24), #4 (05.21.24), #5 (07.01.24)  - s/p IVA OD #1 (02.05.24), #2 (04.09.24), #3 (05.21.24), #4 (07.01.24) - BCVA 20/40 OD - decrease, 20/25 OS -- decrease - FA 10.20.23 shows 360 midzonal NVE w/ leakage OU - OCT shows OD: Mild interval increase in IRF/IRHM IT macula, +PRF overlying disc and inferior macula; OS: Mild cystic changes inferior macula -- stably improved, pre retinal fibrosis overlying disc extending to macula w/ mild traction -- stable, interval improvement in pre retinal hyper reflective material IT macula at 6 weeks - recommend IVA OU (OD #5 and OS #6) today, 08.12.24 with follow up in 6 weeks again - pt wishes to proceed with injection - RBA of procedure discussed, questions answered - IVA informed consent obtained and signed, 12.08.23 (OU) - see procedure note - approved for Eylea for 2024 - f/u 6 weeks -- DFE/OCT, possible injection  4-6. History of high risk pregnancy w/ +HTN and hypertensive retinopathy - s/p Cesarean 06.24.23 (baby boy) - 33 weeks and 1 day, 3lbs, now out of the NICU and at home - history of pre-eclampsia - BP improved since delivery - discussed importance of tight BP control  - continue to monitor  Ophthalmic Meds Ordered this visit:  Meds ordered this encounter  Medications   Bevacizumab (AVASTIN) SOLN 1.25 mg   Bevacizumab (AVASTIN) SOLN 1.25 mg     Return in  about 6 weeks (around 08/16/2023) for f/u PDR OU, DFE, OCT, Possible, IVA, OU.  There are no Patient Instructions on file for this visit.  Explained the diagnoses, plan, and follow up with the patient and they expressed understanding.  Patient expressed understanding of the importance of proper follow up care.   This document serves as a record of services personally performed by Karie Chimera, MD, PhD. It was created on their behalf by Annalee Genta, COMT. The creation of this record is the provider's dictation and/or activities during the visit.  Electronically signed by: Annalee Genta, COMT 07/05/23 10:44 AM  This document serves as a record of services personally performed by Karie Chimera, MD, PhD. It was created on their behalf by Gerilyn Nestle, COT an ophthalmic technician. The creation of this record is the provider's dictation and/or activities during the visit.    Electronically signed by:  Charlette Caffey, COT  07/05/23 10:44 AM   Karie Chimera, M.D., Ph.D. Diseases & Surgery of the Retina and Vitreous [facility]  I have reviewed the above documentation for accuracy and completeness, and I agree with the above. Karie Chimera, M.D., Ph.D. 07/05/23 10:44 AM   Abbreviations: M myopia (nearsighted); A astigmatism; H hyperopia (farsighted); P presbyopia; Mrx spectacle prescription;  CTL contact lenses; OD right eye; OS left eye; OU both eyes  XT exotropia; ET esotropia; PEK punctate epithelial keratitis; PEE punctate epithelial erosions; DES dry eye syndrome; MGD meibomian gland dysfunction; ATs artificial tears; PFAT's preservative free artificial tears; NSC  nuclear sclerotic cataract; PSC posterior subcapsular cataract; ERM epi-retinal membrane; PVD posterior vitreous detachment; RD retinal detachment; DM diabetes mellitus; DR diabetic retinopathy; NPDR non-proliferative diabetic retinopathy; PDR proliferative diabetic retinopathy; CSME clinically  significant macular edema; DME diabetic macular edema; dbh dot blot hemorrhages; CWS cotton wool spot; POAG primary open angle glaucoma; C/D cup-to-disc ratio; HVF humphrey visual field; GVF goldmann visual field; OCT optical coherence tomography; IOP intraocular pressure; BRVO Branch retinal vein occlusion; CRVO central retinal vein occlusion; CRAO central retinal artery occlusion; BRAO branch retinal artery occlusion; RT retinal tear; SB scleral buckle; PPV pars plana vitrectomy; VH Vitreous hemorrhage; PRP panretinal laser photocoagulation; IVK intravitreal kenalog; VMT vitreomacular traction; MH Macular hole;  NVD neovascularization of the disc; NVE neovascularization elsewhere; AREDS age related eye disease study; ARMD age related macular degeneration; POAG primary open angle glaucoma; EBMD epithelial/anterior basement membrane dystrophy; ACIOL anterior chamber intraocular lens; IOL intraocular lens; PCIOL posterior chamber intraocular lens; Phaco/IOL phacoemulsification with intraocular lens placement; PRK photorefractive keratectomy; LASIK laser assisted in situ keratomileusis; HTN hypertension; DM diabetes mellitus; COPD chronic obstructive pulmonary disease

## 2023-07-05 ENCOUNTER — Ambulatory Visit (INDEPENDENT_AMBULATORY_CARE_PROVIDER_SITE_OTHER): Payer: Medicaid Other | Admitting: Ophthalmology

## 2023-07-05 ENCOUNTER — Encounter (INDEPENDENT_AMBULATORY_CARE_PROVIDER_SITE_OTHER): Payer: Self-pay | Admitting: Ophthalmology

## 2023-07-05 DIAGNOSIS — E103513 Type 1 diabetes mellitus with proliferative diabetic retinopathy with macular edema, bilateral: Secondary | ICD-10-CM | POA: Diagnosis not present

## 2023-07-05 DIAGNOSIS — H35373 Puckering of macula, bilateral: Secondary | ICD-10-CM | POA: Diagnosis not present

## 2023-07-05 DIAGNOSIS — O0992 Supervision of high risk pregnancy, unspecified, second trimester: Secondary | ICD-10-CM

## 2023-07-05 DIAGNOSIS — H35033 Hypertensive retinopathy, bilateral: Secondary | ICD-10-CM | POA: Diagnosis not present

## 2023-07-05 DIAGNOSIS — Z794 Long term (current) use of insulin: Secondary | ICD-10-CM | POA: Diagnosis not present

## 2023-07-05 DIAGNOSIS — I10 Essential (primary) hypertension: Secondary | ICD-10-CM

## 2023-07-05 MED ORDER — BEVACIZUMAB CHEMO INJECTION 1.25MG/0.05ML SYRINGE FOR KALEIDOSCOPE
1.2500 mg | INTRAVITREAL | Status: AC | PRN
Start: 2023-07-05 — End: 2023-07-05
  Administered 2023-07-05: 1.25 mg via INTRAVITREAL

## 2023-07-14 ENCOUNTER — Ambulatory Visit: Payer: Medicaid Other | Admitting: Family Medicine

## 2023-07-22 ENCOUNTER — Ambulatory Visit: Payer: Medicaid Other | Admitting: Obstetrics and Gynecology

## 2023-07-23 ENCOUNTER — Encounter: Payer: Self-pay | Admitting: Pharmacist

## 2023-07-23 ENCOUNTER — Ambulatory Visit: Payer: Medicaid Other | Attending: Family Medicine | Admitting: Pharmacist

## 2023-07-23 ENCOUNTER — Other Ambulatory Visit: Payer: Self-pay

## 2023-07-23 VITALS — BP 116/81 | HR 77 | Wt 114.0 lb

## 2023-07-23 DIAGNOSIS — I10 Essential (primary) hypertension: Secondary | ICD-10-CM

## 2023-07-23 DIAGNOSIS — Z794 Long term (current) use of insulin: Secondary | ICD-10-CM | POA: Diagnosis not present

## 2023-07-23 DIAGNOSIS — E10649 Type 1 diabetes mellitus with hypoglycemia without coma: Secondary | ICD-10-CM

## 2023-07-23 LAB — POCT GLYCOSYLATED HEMOGLOBIN (HGB A1C): HbA1c, POC (controlled diabetic range): 13.9 % — AB (ref 0.0–7.0)

## 2023-07-23 MED ORDER — DEXCOM G7 SENSOR MISC
6 refills | Status: DC
Start: 2023-07-23 — End: 2024-04-20
  Filled 2023-07-23 – 2023-08-31 (×2): qty 3, 30d supply, fill #0
  Filled 2023-10-08: qty 3, 30d supply, fill #1
  Filled 2023-10-29 – 2023-11-01 (×2): qty 3, 30d supply, fill #2
  Filled 2023-12-06: qty 3, 30d supply, fill #3
  Filled 2024-01-18: qty 3, 30d supply, fill #4
  Filled 2024-02-15: qty 3, 30d supply, fill #5
  Filled 2024-03-20: qty 3, 30d supply, fill #6

## 2023-07-23 MED ORDER — LANTUS SOLOSTAR 100 UNIT/ML ~~LOC~~ SOPN: 36.0000 [IU] | PEN_INJECTOR | Freq: Every day | SUBCUTANEOUS | 2 refills | Status: DC

## 2023-07-23 NOTE — Progress Notes (Signed)
S:     No chief complaint on file.  37 y.o. female who presents for diabetes evaluation, education, and management.   Patient was referred and last seen by Primary Care Provider, Dr. Andrey Campanile, on 06/17/23. Patient was last seen by Pharmacy clinic on 06/22/23.   PMH is significant for HTN, T1DM, diabetic retinopathy.   At last visit, patient reported not taking bolus insulin and her CGM data showed consistent hyperglycemia >300. Lantus 30 units was continued and humalog was restarted at 12 units [facility].   Today, Patient arrives in good spirits and presents without any assistance. Patient reports using 27 units of lantus and 12 units humalog TID AC. Still endorses hyperglycemia symptoms (peripheral neuropathy, fatigue, polyuria, blurry vision) but states it has improved in the past 5 days or so. Patient does not have CGM on currently nor did she bring in her receiver to review CGM data. Patient would like help applying new Dexcom G7 sensor & setting up her phone as the receiver. A1c 13.9%.  Family/Social History:  -Fhx:  -Never smoker  Current diabetes medications include: lantus 27 units daily, humalog 12 units TID AC Current hypertension medications include: lisinopril 10 mg daily, nifedipine ER 60 mg daily, labetalol 300 mg BID Current hyperlipidemia medications include: ASA 81 mg  Patient reports adherence to taking all medications as prescribed.   Insurance coverage: [place] [employer]  Patient reports one hypoglycemic event. BG was 60--due to unintentional insulin stacking   Reported 2 hour post-meal/random blood sugars: 210-350.  Patient reports nocturia (nighttime urination).  Patient reports neuropathy (nerve pain). Patient reports visual changes. Patient reports self foot exams.   Patient reported dietary habits: not discussed this visit Patient-reported exercise habits: not discussed this visit  O:  ROS  Physical Exam Lab Results  Component Value Date   HGBA1C 13.9 (A)  07/23/2023   There were no vitals filed for this visit.  Lipid Panel     Component Value Date/Time   CHOL 179 10/29/2019 0243   TRIG 77 10/29/2019 0243   HDL 35 (L) 10/29/2019 0243   CHOLHDL 5.1 10/29/2019 0243   VLDL 15 10/29/2019 0243   LDLCALC 129 (H) 10/29/2019 0243    Clinical Atherosclerotic Cardiovascular Disease (ASCVD): No  The ASCVD Risk score (Arnett DK, et al., 2019) failed to calculate for the following reasons:   The 2019 ASCVD risk score is only valid for ages 83 to 13   Patient is participating in a Managed Medicaid Plan:  Yes   A/P: Diabetes longstanding currently uncontrolled. Patient is able to verbalize appropriate hypoglycemia management plan. Medication adherence appears appropriate. Control is suboptimal due to medication regimen & non-adherence to CGM. I applied Dexcom G7 for her and got her app set up on a new phone. Patient was educated on how to use sensor + app and to call if BG is consistently >300 or if frequent lows occur.  -Increased dose of basal insulin Lantus (insulin glargine) from 27 units to 36 units daily in the morning. -start using Dexcom G7 sensor, replace every 10 days -Continued rapid insulin Novolog (insulin aspart) Humalog (insulin lispro) from 12 units TID before meals.  -Patient educated on purpose, proper use, and potential adverse effects of insulin.  -Extensively discussed pathophysiology of diabetes, recommended lifestyle interventions, dietary effects on blood sugar control.  -Counseled on s/sx of and management of hypoglycemia.  -Next A1c anticipated 09/2023.   Hypertension longstanding currently controlled. Blood pressure goal of <130/80 mmHg. Medication adherence appropriate.  -  Continued lisinopril, labetalol, nifedipine. Patient is not pregnant (has Nexplanon in currently)  Written patient instructions provided. Patient verbalized understanding of treatment plan.  Total time in face to face counseling 60 minutes.     Follow-up:  Pharmacist in 3 weeks PCP clinic visit in 2 months  Patient seen with Kristin Clay, PharmD Candidate [employer] [facility]  Kristin Clay, PharmD, [place], CPP Clinical Pharmacist [facility] [phone]

## 2023-07-29 ENCOUNTER — Other Ambulatory Visit: Payer: Self-pay

## 2023-08-20 ENCOUNTER — Encounter (INDEPENDENT_AMBULATORY_CARE_PROVIDER_SITE_OTHER): Payer: Medicaid Other | Admitting: Ophthalmology

## 2023-08-20 ENCOUNTER — Ambulatory Visit (INDEPENDENT_AMBULATORY_CARE_PROVIDER_SITE_OTHER): Payer: Medicaid Other | Admitting: Ophthalmology

## 2023-08-20 ENCOUNTER — Encounter (INDEPENDENT_AMBULATORY_CARE_PROVIDER_SITE_OTHER): Payer: Self-pay | Admitting: Ophthalmology

## 2023-08-20 DIAGNOSIS — I10 Essential (primary) hypertension: Secondary | ICD-10-CM

## 2023-08-20 DIAGNOSIS — H35033 Hypertensive retinopathy, bilateral: Secondary | ICD-10-CM

## 2023-08-20 DIAGNOSIS — Z794 Long term (current) use of insulin: Secondary | ICD-10-CM

## 2023-08-20 DIAGNOSIS — H35373 Puckering of macula, bilateral: Secondary | ICD-10-CM

## 2023-08-20 DIAGNOSIS — E103513 Type 1 diabetes mellitus with proliferative diabetic retinopathy with macular edema, bilateral: Secondary | ICD-10-CM

## 2023-08-20 DIAGNOSIS — O0992 Supervision of high risk pregnancy, unspecified, second trimester: Secondary | ICD-10-CM

## 2023-08-20 MED ORDER — BEVACIZUMAB CHEMO INJECTION 1.25MG/0.05ML SYRINGE FOR KALEIDOSCOPE
1.2500 mg | INTRAVITREAL | Status: AC | PRN
Start: 2023-08-20 — End: 2023-08-20
  Administered 2023-08-20: 1.25 mg via INTRAVITREAL

## 2023-08-20 NOTE — Progress Notes (Signed)
[facility] - Clinic Note  08/20/2023     CHIEF COMPLAINT Patient presents for Retina Follow Up   HISTORY OF PRESENT ILLNESS: Kristin Clay is a 37 y.o. female who presents to the clinic today for:   HPI     Retina Follow Up   Patient presents with  Diabetic Retinopathy.  In both eyes.  This started 6 weeks ago.  Duration of 6 weeks.  Since onset it is stable.        Comments   6 week retina follow up PDR OU and IVA OU PT is reporting no vision changes noticed she denies any flashes or floaters her last reading 210 yesterday pt is has recently increased insulin       Last edited by Etheleen Mayhew, COT on 08/20/2023 12:38 PM.     Patient states that she feels the vision is improving.   Referring physician: Aura Camps MD [address] [place], [place] [address]  HISTORICAL INFORMATION:   Selected notes from the MEDICAL RECORD NUMBER Referred by Dr. Karleen Hampshire for diabetic retinal evaluation LEE:  Ocular Hx- PMH-pregnancy, DM    CURRENT MEDICATIONS: No current outpatient medications on file. (Ophthalmic Drugs)   No current facility-administered medications for this visit. (Ophthalmic Drugs)   Current Outpatient Medications (Other)  Medication Sig   aspirin 81 MG chewable tablet Chew by mouth.   Continuous Glucose Receiver (DEXCOM G7 RECEIVER) DEVI Use to check blood sugar continuously throughout the day.   Continuous Glucose Sensor (DEXCOM G7 SENSOR) MISC Use to check blood sugar continuously throughout the day. Change sensors once every 10 days.   insulin aspart (NOVOLOG FLEXPEN) 100 UNIT/ML FlexPen Inject 12 Units into the skin 3 (three) times daily with meals.   insulin glargine (LANTUS SOLOSTAR) 100 UNIT/ML Solostar Pen Inject 36 Units into the skin daily.   Insulin Pen Needle 31G X 5 MM MISC 1 each by Does not apply route at bedtime.   labetalol (NORMODYNE) 300 MG tablet Take 1 tablet (300 mg total) by mouth  2 (two) times daily.   lisinopril (ZESTRIL) 10 MG tablet Take by mouth.   NIFEdipine (ADALAT CC) 60 MG 24 hr tablet Take 1 tablet (60 mg total) by mouth 2 (two) times daily.   norethindrone (MICRONOR) 0.35 MG tablet Take 1 tablet (0.35 mg total) by mouth daily.   No current facility-administered medications for this visit. (Other)   REVIEW OF SYSTEMS: ROS   Positive for: Endocrine, Eyes Negative for: Constitutional, Gastrointestinal, Neurological, Skin, Genitourinary, Musculoskeletal, HENT, Cardiovascular, Respiratory, Psychiatric, Allergic/Imm, Heme/Lymph Last edited by Etheleen Mayhew, COT on 08/20/2023 12:37 PM.       ALLERGIES Allergies  Allergen Reactions   Pork-Derived Products    PAST MEDICAL HISTORY Past Medical History:  Diagnosis Date   Chronic hypertension    Complication of anesthesia    with hand surgery, local anes did not work, tried twice- had to put her to sleep   Diabetic retinopathy (HCC)    DKA (diabetic ketoacidosis) (HCC) 10/2020   Epidural hematoma (HCC) 07/04/2021   Obstetric vaginal laceration with type 3c third degree perineal laceration 06/29/2021   Pyelonephritis 10/2020   Rh negative status during pregnancy 05/01/2021   s/p Rhogam 05/13/21   Type I diabetes mellitus (HCC)    dx 45yrs ago   UTI (urinary tract infection)    Past Surgical History:  Procedure Laterality Date   CESAREAN SECTION  05/16/2022   Procedure:  CESAREAN SECTION;  Surgeon: Warden Fillers, MD;  Location: [facility] LD [facility]S;  Service: Obstetrics;;   female circumcision Bilateral    clitorectomy as well   FOOT SURGERY  2018   HAND SURGERY  2019   FAMILY HISTORY Family History  Problem Relation Age of Onset   Hyperlipidemia Mother    Hypertension Mother    Kidney disease Father    Alzheimer's disease Father    SOCIAL HISTORY Social History   Tobacco Use   Smoking status: Never   Smokeless tobacco: Never  Vaping Use   Vaping status: Never Used  Substance Use Topics    Alcohol use: Never   Drug use: Never       OPHTHALMIC EXAM:  Base Eye Exam     Visual Acuity (Snellen - Linear)       Right Left   Dist [place] 20/50 -2 20/30 -1   Dist ph [place] 20/30 -2 NI         Tonometry (Tonopen, 12:42 PM)       Right Left   Pressure 17 18         Pupils       Pupils Dark Light Shape React APD   Right PERRL 3 2 Round Brisk None   Left PERRL 3 2 Round Brisk None         Visual Fields       Left Right    Full Full         Extraocular Movement       Right Left    Full, Ortho Full, Ortho         Neuro/Psych     Oriented x3: Yes   Mood/Affect: Normal         Dilation     Both eyes: 2.5% Phenylephrine            Slit Lamp and Fundus Exam     Slit Lamp Exam       Right Left   Lids/Lashes normal normal   Conjunctiva/Sclera mild melanosis, Trace Injection, +corkscrew vessels nasally mild melanosis   Cornea trace PEE Clear   Anterior Chamber deep, clear, narrow temporal angle deep and quiet   Iris round and dilated, no NVI round and dilated, no NVI   Lens 1+Cortical changes Clear   Anterior Vitreous syneresis syneresis         Fundus Exam       Right Left   Disc pink and sharp; compact; mild hyperemia; +fibrosis, fine NVD -- regressing; +PPP pink and sharp; compact; +fibrosis w/ fine NVD -- regressing   C/D Ratio 0.2 0.2   Macula good foveal reflex, scattered MA/DBH, punctate exudates and cystic changes temporal macula-slightly increased, fine NVE inferior and temporal macula - regressing, ERM with striae nasally good foveal reflex, ERM with striae, scattered MA/DBH greatest temporal macula -- improving, exudates temporally, scattered fibrosis and NV, preretinal heme IT macula -- improving   Vessels attenuated, Tortuous, early NVE greatest inferior arcades -- ?regressing, Copper wiring attenuated, +NVE -- scattered, +fibrosis, Tortuous   Periphery attached, 360 MA/DBH, good 360 PRP laser changes and posterior fill in --  some room for fill in, scattered NV -- regressing attached, 360 MA/DBH, good 360 PRP with room for fill in, scattered NV -- regressing           IMAGING AND PROCEDURES  Imaging and Procedures for 08/20/2023  OCT, Retina - OU - Both Eyes       Right Eye  Quality was good. Central Foveal Thickness: 234. Progression has improved. Findings include normal foveal contour, no SRF, intraretinal hyper-reflective material, intraretinal fluid, vitreomacular adhesion , preretinal fibrosis (Persistent IRF/IRHM IT macula--slightly improved, +PRF overlying disc and inferior macula).   Left Eye Quality was good. Central Foveal Thickness: 251. Progression has improved. Findings include normal foveal contour, no IRF, no SRF, epiretinal membrane, macular pucker, vitreous traction, preretinal fibrosis (Mild cystic changes inferior macula -- stably improved, pre retinal fibrosis overlying disc extending to macula w/ mild traction -- stable, interval improvement in pre retinal hyper reflective material IT macula ).   Notes *Images captured and stored on drive  Diagnosis / Impression:  PDR w/ DME OU OD: Mild interval increase in IRF/IRHM IT macula, +PRF overlying disc and inferior macula OS: Mild cystic changes inferior macula -- stably improved, pre retinal fibrosis overlying disc extending to macula w/ mild traction -- stable, interval improvement in pre retinal hyper reflective material IT macula   Clinical management:  See below  Abbreviations: NFP - Normal foveal profile. CME - cystoid macular edema. PED - pigment epithelial detachment. IRF - intraretinal fluid. SRF - subretinal fluid. EZ - ellipsoid zone. ERM - epiretinal membrane. ORA - outer retinal atrophy. ORT - outer retinal tubulation. SRHM - subretinal hyper-reflective material. IRHM - intraretinal hyper-reflective material      Intravitreal Injection, Pharmacologic Agent - OD - Right Eye       Time Out 08/20/2023. 12:56 PM. Confirmed  correct patient, procedure, site, and patient consented.   Anesthesia Topical anesthesia was used. Anesthetic medications included Lidocaine 2%, Proparacaine 0.5%.   Procedure Preparation included 5% betadine to ocular surface, eyelid speculum. A supplied needle was used.   Injection: 1.25 mg Bevacizumab 1.25mg /0.14ml   Route: Intravitreal, Site: Right Eye   NDC: P3213405   Post-op Post injection exam found visual acuity of at least counting fingers. The patient tolerated the procedure well. There were no complications. The patient received written and verbal post procedure care education.      Intravitreal Injection, Pharmacologic Agent - OS - Left Eye       Time Out 08/20/2023. 12:57 PM. Confirmed correct patient, procedure, site, and patient consented.   Anesthesia Topical anesthesia was used. Anesthetic medications included Lidocaine 2%, Proparacaine 0.5%.   Procedure Preparation included 5% betadine to ocular surface, eyelid speculum. A (32g) needle was used.   Injection: 1.25 mg Bevacizumab 1.25mg /0.32ml   Route: Intravitreal, Site: Left Eye   NDC: P3213405   Post-op Post injection exam found visual acuity of at least counting fingers. The patient tolerated the procedure well. There were no complications. The patient received written and verbal post procedure care education. Post injection medications were not given.             ASSESSMENT/PLAN:    ICD-10-CM   1. 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 Current use of insulin (HCC)  Z79.4     3. Epiretinal membrane (ERM) of both eyes  H35.373     4. Essential hypertension  I10     5. Hypertensive retinopathy of both eyes  H35.033     6. High-risk  pregnancy in second trimester  O09.92      1-3. Proliferative diabetic retinopathy  w/ DME and ERM, both eyes - last A1c 13.9 (08.30.24), 10.9 (02.16.24), 14.5 (09/01/22), 7.4 (06.06.23) -delayed to follow up from 4 weeks to 9 weeks (02.05.24 to 04.09.24)  - s/p PRP OD (03.27.23), fill in (05.15.23)  - s/p PRP OS (04.17.23), fill in (10.20.23) - s/p IVA OS #1 (12.08.23), #2 (01.08.24), #3 (04.09.24), #4 (05.21.24), #5 (07.01.24),  #6 (8.12.24) - s/p IVA OD #1 (02.05.24), #2 (04.09.24), #3 (05.21.24), #4 (07.01.24), #5 (08.12.24) - BCVA 20/30 OU - FA 10.20.23 shows 360 midzonal NVE w/ leakage OU - OCT shows OD: Persistent IRF/IRHM IT macula--slightly improved, +PRF overlying disc and inferior macula OS: Mild cystic changes inferior macula -- stably improved, pre retinal fibrosis overlying disc extending to macula w/ mild traction -- stable, interval improvement in pre retinal hyper reflective material IT macula at 6 weeks - recommend IVA OU (OD #6 and OS #7) today, 9.27.24 with follow up in 6 weeks again - pt wishes to proceed with injection - RBA of procedure discussed, questions answered - IVA informed consent obtained and signed, 12.08.23 (OU) - see procedure note - approved for Eylea for 2024 - f/u 6 weeks -- DFE/OCT, possible injection  4-6. History of high risk pregnancy w/ +HTN and hypertensive retinopathy - s/p Cesarean 06.24.23 (baby boy) - 33 weeks and 1 day, 3lbs, now out of the NICU and at home - history of pre-eclampsia - BP improved since delivery - discussed importance of tight BP control  - continue to monitor  Ophthalmic Meds Ordered this visit:  No orders of the defined types were placed in this encounter.    Return in about 6 weeks (around 10/01/2023) for f/u PDR OU, DFE, OCT, Possible, IVA, OU.  There are no Patient Instructions on file for this visit.  Explained the diagnoses, plan, and follow up with the patient and they expressed understanding.  Patient  expressed understanding of the importance of proper follow up care.   This document serves as a record of services personally performed by Karie Chimera, MD, PhD. It was created on their behalf by Charlette Caffey, COT an ophthalmic technician. The creation of this record is the provider's dictation and/or activities during the visit.    Electronically signed by:  Charlette Caffey, COT  08/20/23 1:02 PM  Karie Chimera, M.D., Ph.D. Diseases & Surgery of the Retina and Vitreous [facility]     Abbreviations: M myopia (nearsighted); A astigmatism; H hyperopia (farsighted); P presbyopia; Mrx spectacle prescription;  CTL contact lenses; OD right eye; OS left eye; OU both eyes  XT exotropia; ET esotropia; PEK punctate epithelial keratitis; PEE punctate epithelial erosions; DES dry eye syndrome; MGD meibomian gland dysfunction; ATs artificial tears; PFAT's preservative free artificial tears; NSC nuclear sclerotic cataract; PSC posterior subcapsular cataract; ERM epi-retinal membrane; PVD posterior vitreous detachment; RD retinal detachment; DM diabetes mellitus; DR diabetic retinopathy; NPDR non-proliferative diabetic retinopathy; PDR proliferative diabetic retinopathy; CSME clinically significant macular edema; DME diabetic macular edema; dbh dot blot hemorrhages; CWS cotton wool spot; POAG primary open angle glaucoma; C/D cup-to-disc ratio; HVF humphrey visual field; GVF goldmann visual field; OCT optical coherence tomography; IOP intraocular pressure; BRVO Branch retinal vein occlusion; CRVO central retinal vein occlusion; CRAO central retinal artery occlusion; BRAO branch retinal artery occlusion; RT retinal tear; SB scleral buckle; PPV pars plana vitrectomy; VH Vitreous hemorrhage; PRP panretinal laser photocoagulation; IVK intravitreal kenalog; VMT vitreomacular traction; MH Macular hole;  NVD neovascularization of the disc; NVE neovascularization elsewhere;  AREDS age  related eye disease study; ARMD age related macular degeneration; POAG primary open angle glaucoma; EBMD epithelial/anterior basement membrane dystrophy; ACIOL anterior chamber intraocular lens; IOL intraocular lens; PCIOL posterior chamber intraocular lens; Phaco/IOL phacoemulsification with intraocular lens placement; PRK photorefractive keratectomy; LASIK laser assisted in situ keratomileusis; HTN hypertension; DM diabetes mellitus; COPD chronic obstructive pulmonary disease

## 2023-08-29 NOTE — Progress Notes (Unsigned)
S:     No chief complaint on file.  37 y.o. female who presents for diabetes evaluation, education, and management.   Patient was referred and last seen by Primary Care Provider, Dr. Andrey Campanile, on 06/17/23. Patient was last seen by Pharmacy clinic on 07/23/23.   PMH is significant for HTN, T1DM, diabetic retinopathy.   At last visit, patient continued to endorse hyperglycemia symptoms, but noted some improvement the 5 days before her visit. Was not wearing CGM, but brought Dexcom G7 to clinic which was applied and app on her phone was set up. A1c checked at that visit increased to 13.9%. Lantus was increased from 27 units to 36 units daily and Humalog 12 units TID AC was continued.  Today ***   A1C = 13.9% Review medications and adherence (timing of meds, etc.)  Side effects with meds? At home BGs?  Highs Lows  Hyperglycemia sx (nocturia, neuropathy, visual changes, foot exams) Hypoglycemia symptoms (dizziness, shaky, sweating, hungry, confusion) Diet Exercise   Family/Social History:  -Fhx:  -Never smoker  Current diabetes medications include: lantus 36 units daily, humalog 12 units TID AC Current hypertension medications include: lisinopril 10 mg daily, nifedipine ER 60 mg daily, labetalol 300 mg BID Current hyperlipidemia medications include: ASA 81 mg  Patient reports adherence to taking all medications as prescribed.   Insurance coverage: [place] Medicaid  Patient reports one hypoglycemic event. BG was 60--due to unintentional insulin stacking   Reported 2 hour post-meal/random blood sugars: 210-350.  Patient reports nocturia (nighttime urination).  Patient reports neuropathy (nerve pain). Patient reports visual changes. Patient reports self foot exams.   Patient reported dietary habits: not discussed this visit Patient-reported exercise habits: not discussed this visit  O:  ROS  Physical Exam Lab Results  Component Value Date   HGBA1C 13.9 (A) 07/23/2023    There were no vitals filed for this visit.  Lipid Panel     Component Value Date/Time   CHOL 179 10/29/2019 0243   TRIG 77 10/29/2019 0243   HDL 35 (L) 10/29/2019 0243   CHOLHDL 5.1 10/29/2019 0243   VLDL 15 10/29/2019 0243   LDLCALC 129 (H) 10/29/2019 0243    Clinical Atherosclerotic Cardiovascular Disease (ASCVD): No  The ASCVD Risk score (Arnett DK, et al., 2019) failed to calculate for the following reasons:   The 2019 ASCVD risk score is only valid for ages 31 to 64   Patient is participating in a Managed Medicaid Plan:  Yes   A/P: Diabetes longstanding currently uncontrolled. Patient is able to verbalize appropriate hypoglycemia management plan. Medication adherence appears appropriate. Control is suboptimal due to medication regimen & non-adherence to CGM. I applied Dexcom G7 for her and got her app set up on a new phone. Patient was educated on how to use sensor + app and to call if BG is consistently >300 or if frequent lows occur.  -Increased dose of basal insulin Lantus (insulin glargine) from 27 units to 36 units daily in the morning. -start using Dexcom G7 sensor, replace every 10 days -Continued rapid insulin Novolog (insulin aspart) Humalog (insulin lispro) from 12 units TID before meals.  -Patient educated on purpose, proper use, and potential adverse effects of insulin.  -Extensively discussed pathophysiology of diabetes, recommended lifestyle interventions, dietary effects on blood sugar control.  -Counseled on s/sx of and management of hypoglycemia.  -Next A1c anticipated 09/2023.   Hypertension longstanding currently controlled. Blood pressure goal of <130/80 mmHg. Medication adherence appropriate.  -Continued lisinopril, labetalol,  nifedipine. Patient is not pregnant (has Nexplanon in currently)  Written patient instructions provided. Patient verbalized understanding of treatment plan.  Total time in face to face counseling 60 minutes.    Follow-up:   Pharmacist in 3 weeks PCP clinic visit in 2 months  Patient seen with Kristin Clay, Kristin Clay Candidate [employer] [facility]  Kristin Clay, Kristin Clay, Kristin Clay, Kristin Clay Clinical Pharmacist [facility] [phone]

## 2023-08-30 ENCOUNTER — Encounter: Payer: Self-pay | Admitting: Family Medicine

## 2023-08-30 ENCOUNTER — Ambulatory Visit: Payer: Medicaid Other | Admitting: Pharmacist

## 2023-08-30 ENCOUNTER — Ambulatory Visit (INDEPENDENT_AMBULATORY_CARE_PROVIDER_SITE_OTHER): Payer: Medicaid Other | Admitting: Family Medicine

## 2023-08-30 VITALS — BP 109/77 | HR 79 | Wt 119.7 lb

## 2023-08-30 DIAGNOSIS — Z3046 Encounter for surveillance of implantable subdermal contraceptive: Secondary | ICD-10-CM | POA: Diagnosis not present

## 2023-08-30 DIAGNOSIS — Z603 Acculturation difficulty: Secondary | ICD-10-CM | POA: Diagnosis not present

## 2023-08-30 DIAGNOSIS — Z758 Other problems related to medical facilities and other health care: Secondary | ICD-10-CM

## 2023-08-30 DIAGNOSIS — O1414 Severe pre-eclampsia complicating childbirth: Secondary | ICD-10-CM

## 2023-08-30 DIAGNOSIS — Z3169 Encounter for other general counseling and advice on procreation: Secondary | ICD-10-CM

## 2023-08-30 DIAGNOSIS — E1042 Type 1 diabetes mellitus with diabetic polyneuropathy: Secondary | ICD-10-CM | POA: Diagnosis not present

## 2023-08-30 MED ORDER — PREPLUS 27-1 MG PO TABS
1.0000 | ORAL_TABLET | Freq: Every day | ORAL | 13 refills | Status: AC
Start: 2023-08-30 — End: ?

## 2023-08-30 NOTE — Progress Notes (Unsigned)
Contraception/Family Planning VISIT ENCOUNTER NOTE  Subjective:   Kristin Clay is a 37 y.o. [phone] female here for reproductive life counseling.  Reports she does want a pregnancy in the next year. Denies abnormal vaginal bleeding, discharge, pelvic pain, problems with intercourse or other gynecologic concerns.    Gynecologic History Patient's last menstrual period was 08/25/2023 (exact date). Contraception: none  Health Maintenance Due  Topic Date Due   INFLUENZA VACCINE  06/24/2023   COVID-19 Vaccine (1 - 2023-24 season) Never done   Diabetic kidney evaluation - eGFR measurement  09/02/2023   Diabetic kidney evaluation - Urine ACR  09/02/2023    The following portions of the patient's history were reviewed and updated as appropriate: allergies, current medications, past family history, past medical history, past social history, past surgical history and problem list.  Review of Systems Pertinent items are noted in HPI.   Objective:  BP 109/77   Pulse 79   Wt 119 lb 11.2 oz (54.3 kg)   LMP 08/25/2023 (Exact Date)   BMI 21.89 kg/m  Gen: well appearing, NAD HEENT: no scleral icterus CV: RR Lung: Normal WOB Ext: warm well perfused. Nexplanon placed incorrectly, scar from insertion is well below level of medial epicondyle and mid-tricep muscle.   Nexplanon Removal Patient identified, informed consent performed, consent signed.   Appropriate time out taken. Nexplanon site identified.  Area prepped in usual sterile fashon. One ml of 1% lidocaine was used to anesthetize the area at the distal end of the implant. A small stab incision was made right beside the implant on the distal portion.  The Nexplanon rod was grasped using hemostats and removed with difficulty-- there was dense scar tissue over the distal edge and it was grasped about 5mm from the distal end and removed in this fashion and blunt dissection.  There was minimal blood loss. There were no  complications.  3 ml of 1% lidocaine was injected around the incision for post-procedure analgesia.  Steri-strips were applied over the small incision.  A pressure bandage was applied to reduce any bruising.  The patient tolerated the procedure well and was given post procedure instructions.  Patient is planning to use nothing for contraception/attempt conception.   Assessment and Plan:   Contraception counseling: Patient has Nexplanon. She was put on POP in May 2024. This stopped her bleeding for 3 months and then is return. She reports hair loss and also worsening headaches.   1. Nexplanon removal See above   2. Encounter for preconception consultation Extensive counseling about her multiple high risk conditions and the high risk nature of pregnancy for this patient. She desires another pregnancy. Reviewed at length the risk associated with her uncontrolled T1DM (last HA1C 13.9%), risk of severe PEC with future pregnancy. Patient also asked about achieving a twin pregnancy with medication and I firmly but kindly stated that given the very high risk nature of her pregnancies and that twin pregnancy would further increase these risks we would not prescribe medications that increase the likelihood of twin pregnancy.   Additionally I discussed that her hair loss is likely related to her poorly controlled diabetes and is unlikely to resolve with removal of Nexplanon.   Patient is also s/p CS about 14 months ago.   Recommended starting prenatal vitamin Continue to improve her glucose control and risk of miscarriage and fetal anomalies with hyperglycemia Reviewed that once pregnant and > 12 weeks we will want to start aspirin to reduce preeclampsia risk  Discontinued ACE-I due to tetratogenic nature of medication. Message sent to patient to reinforce stopping Establish care quickly for positive pregnancy test Will need viability [place]  Arabic interpreter used:  Elzehour Elmahadi  Please refer to  After Visit Summary for other counseling recommendations.   No follow-ups on file.  Federico Flake, MD, MPH, ABFM Attending Physician Faculty Practice- [facility]

## 2023-08-31 ENCOUNTER — Encounter: Payer: Self-pay | Admitting: Family Medicine

## 2023-08-31 ENCOUNTER — Encounter: Payer: Self-pay | Admitting: Pharmacist

## 2023-08-31 ENCOUNTER — Ambulatory Visit: Payer: Medicaid Other | Attending: Family Medicine | Admitting: Pharmacist

## 2023-08-31 ENCOUNTER — Other Ambulatory Visit: Payer: Self-pay

## 2023-08-31 DIAGNOSIS — Z794 Long term (current) use of insulin: Secondary | ICD-10-CM

## 2023-08-31 DIAGNOSIS — E10649 Type 1 diabetes mellitus with hypoglycemia without coma: Secondary | ICD-10-CM

## 2023-08-31 MED ORDER — LANTUS SOLOSTAR 100 UNIT/ML ~~LOC~~ SOPN
32.0000 [IU] | PEN_INJECTOR | Freq: Every day | SUBCUTANEOUS | 2 refills | Status: DC
  Filled 2023-08-31 – 2023-10-08 (×3): qty 15, 46d supply, fill #0

## 2023-08-31 MED ORDER — NOVOLOG FLEXPEN 100 UNIT/ML ~~LOC~~ SOPN
12.0000 [IU] | PEN_INJECTOR | Freq: Three times a day (TID) | SUBCUTANEOUS | 2 refills | Status: DC
  Filled 2023-08-31: qty 15, 42d supply, fill #0
  Filled 2023-10-08: qty 15, 42d supply, fill #1

## 2023-08-31 NOTE — Progress Notes (Signed)
    S:     No chief complaint on file.  37 y.o. female who presents for diabetes evaluation, education, and management. Patient was referred and last seen by Primary Care Provider, Dr. Andrey Campanile, on 06/17/23. Patient was last seen by Pharmacy clinic on 07/23/23. A1c at that visit was 13.9%. We adjusted her insulin and provided her with CGM refills.   PMH is significant for HTN, T1DM, diabetic retinopathy.   Today, patient arrives in good spirits and presents without any assistance. Patient reports using 36 units of lantus and 10 units humalog TID AC. Still endorses hyperglycemia symptoms (peripheral neuropathy, fatigue, polyuria, blurry vision) but states it has improved somewhat since last visit. Patient has not refilled her CGM supplies since last visit. She is checking blood sugar at home sparingly.   Family/Social History:  -Fhx: HLD, HTN, kidney disease -Never smoker  Current diabetes medications include: Lantus 36 units daily, humalog 12 units TID AC (taking 10u TID) Patient reports adherence to taking all medications as prescribed.   Insurance coverage: [place] [employer]  Patient reports one hypoglycemic event since last visit. BG was 55--due to skipping meals.    Reported 2 hour post-meal/random blood sugars: 180s - 240s  Patient reports nocturia (nighttime urination).  Patient reports neuropathy (nerve pain). Patient reports visual changes. Patient reports self foot exams.   Patient reported dietary habits: not discussed this visit Patient-reported exercise habits: not discussed this visit  O:  ROS  Physical Exam Lab Results  Component Value Date   HGBA1C 13.9 (A) 07/23/2023   There were no vitals filed for this visit.  Lipid Panel     Component Value Date/Time   CHOL 179 10/29/2019 0243   TRIG 77 10/29/2019 0243   HDL 35 (L) 10/29/2019 0243   CHOLHDL 5.1 10/29/2019 0243   VLDL 15 10/29/2019 0243   LDLCALC 129 (H) 10/29/2019 0243    Clinical Atherosclerotic  Cardiovascular Disease (ASCVD): No  The ASCVD Risk score (Arnett DK, et al., 2019) failed to calculate for the following reasons:   The 2019 ASCVD risk score is only valid for ages 41 to 76   Patient is participating in a Managed Medicaid Plan:  Yes   A/P: Diabetes longstanding currently uncontrolled. Patient is able to verbalize appropriate hypoglycemia management plan. Medication adherence appears appropriate. Control is suboptimal due to medication regimen & non-adherence to CGM. I refilled her insulin and CGM supplies today.  -Decrease Lantus (insulin glargine) to 32 units daily in the morning. -Start using Dexcom G7 sensor, replace every 10 days -Increase Novolog (insulin aspart) to 12 units TID before meals as prescribed. -Patient educated on purpose, proper use, and potential adverse effects of insulin.  -Extensively discussed pathophysiology of diabetes, recommended lifestyle interventions, dietary effects on blood sugar control.  -Counseled on s/sx of and management of hypoglycemia.  -Next A1c anticipated 09/2023.   Written patient instructions provided. Patient verbalized understanding of treatment plan.  Total time in face to face counseling 45 minutes.    Follow-up:  Pharmacist in 1 month. PCP clinic visit: 09/17/2023. Endocrine 10/14/2024.   Butch Penny, PharmD, [place]y [place], CPP Clinical Pharmacist [facility] [phone]

## 2023-09-09 ENCOUNTER — Other Ambulatory Visit: Payer: Self-pay | Admitting: Family Medicine

## 2023-09-09 NOTE — Telephone Encounter (Signed)
Requested Prescriptions  Refused Prescriptions Disp Refills   LANTUS SOLOSTAR 100 UNIT/ML Solostar Pen [Pharmacy Med Name: LANTUS SOLOSTAR 100 UNIT/ML]      Sig: INJECT 30 UNITS INTO THE SKIN DAILY     Endocrinology:  Diabetes - Insulins Failed - 09/09/2023  2:31 PM      Failed - HBA1C is between 0 and 7.9 and within 180 days    HbA1c, POC (controlled diabetic range)  Date Value Ref Range Status  07/23/2023 13.9 (A) 0.0 - 7.0 % Final         Passed - Valid encounter within last 6 months    Recent Outpatient Visits           1 week ago Long term (current) use of insulin (HCC)   [facility] [place], Cornelius Moras, RPH-CPP   1 week ago    [facility] [place], [place] L, RPH-CPP   1 month ago Type 1 diabetes mellitus with hypoglycemia and without coma [facility])   [place] [facility] [place], [place] L, RPH-CPP   2 months ago Type 1 diabetes mellitus with hypoglycemia and without coma [facility])   [facility] [place], [place] L, RPH-CPP   2 months ago Uncontrolled type 2 diabetes mellitus with hypoglycemia without coma [facility])   [facility], MD       Future Appointments             In 1 week Georganna Skeans, MD [facility]   In 2 weeks Lois Huxley, Cornelius Moras, RPH-CPP  [facility]

## 2023-09-14 ENCOUNTER — Other Ambulatory Visit: Payer: Self-pay | Admitting: Cardiology

## 2023-09-17 ENCOUNTER — Ambulatory Visit: Payer: Medicaid Other | Admitting: Family Medicine

## 2023-09-22 ENCOUNTER — Encounter: Payer: Self-pay | Admitting: Family Medicine

## 2023-09-22 ENCOUNTER — Ambulatory Visit: Payer: Medicaid Other | Admitting: Family Medicine

## 2023-09-22 VITALS — BP 102/68 | HR 88 | Temp 98.1°F | Resp 16 | Ht 63.0 in | Wt 130.4 lb

## 2023-09-22 DIAGNOSIS — I10 Essential (primary) hypertension: Secondary | ICD-10-CM | POA: Diagnosis not present

## 2023-09-22 DIAGNOSIS — Z794 Long term (current) use of insulin: Secondary | ICD-10-CM | POA: Diagnosis not present

## 2023-09-22 DIAGNOSIS — E11649 Type 2 diabetes mellitus with hypoglycemia without coma: Secondary | ICD-10-CM | POA: Diagnosis not present

## 2023-09-22 DIAGNOSIS — M25512 Pain in left shoulder: Secondary | ICD-10-CM

## 2023-09-22 LAB — POCT GLYCOSYLATED HEMOGLOBIN (HGB A1C): Hemoglobin A1C: 10.7 % — AB (ref 4.0–5.6)

## 2023-09-22 MED ORDER — METHYLPREDNISOLONE 4 MG PO TBPK: ORAL_TABLET | ORAL | 0 refills | Status: DC

## 2023-09-22 NOTE — Progress Notes (Unsigned)
Established Patient Office Visit  Subjective    Patient ID: Kristin Clay, female    DOB: May 26, 1986  Age: 37 y.o. MRN: [id number]  CC:  Chief Complaint  Patient presents with   Follow-up    HPI Kristin Clay presents for follow up of chronic med issues including diabetes and hypertension. Patient also reports some left shoulder pain. Reports sx for months. Having difficulty moving her shoulder. Denies known trauma or injury. This visit was aided by and interpreter.   Outpatient Encounter Medications as of 09/22/2023  Medication Sig   aspirin 81 MG chewable tablet Chew by mouth.   Continuous Glucose Receiver (DEXCOM G7 RECEIVER) DEVI Use to check blood sugar continuously throughout the day.   Continuous Glucose Sensor (DEXCOM G7 SENSOR) MISC Use to check blood sugar continuously throughout the day. Change sensors once every 10 days.   insulin aspart (NOVOLOG FLEXPEN) 100 UNIT/ML FlexPen Inject 12 Units into the skin 3 (three) times daily with meals.   insulin glargine (LANTUS SOLOSTAR) 100 UNIT/ML Solostar Pen Inject 32 Units into the skin daily.   Insulin Pen Needle 31G X 5 MM MISC 1 each by Does not apply route at bedtime.   labetalol (NORMODYNE) 300 MG tablet Take 1 tablet (300 mg total) by mouth 2 (two) times daily.   NIFEdipine (ADALAT CC) 60 MG 24 hr tablet TAKE 1 TABLET BY MOUTH 2 TIMES DAILY.   Prenatal Vit-Fe Fumarate-FA (PREPLUS) 27-1 MG TABS Take 1 tablet by mouth daily.   [DISCONTINUED] insulin detemir (LEVEMIR) 100 UNIT/ML injection Inject 20-40 Units into the skin See admin instructions. 40 units every morning and 20 units every night (Patient not taking: Reported on 04/11/2021)   No facility-administered encounter medications on file as of 09/22/2023.    Past Medical History:  Diagnosis Date   Chronic hypertension    Complication of anesthesia    with hand surgery, local anes did not work, tried twice- had to put her to sleep   Diabetic  retinopathy (HCC)    DKA (diabetic ketoacidosis) (HCC) 10/2020   Epidural hematoma (HCC) 07/04/2021   Hypertension in pregnancy, preeclampsia, severe, delivered 12/15/2021   Obstetric vaginal laceration with type 3c third degree perineal laceration 06/29/2021   Pyelonephritis 10/2020   Rh negative status during pregnancy 05/01/2021   s/p Rhogam 05/13/21   Type I diabetes mellitus (HCC)    dx 27yrs ago   UTI (urinary tract infection)     Past Surgical History:  Procedure Laterality Date   CESAREAN SECTION  05/16/2022   Procedure: CESAREAN SECTION;  Surgeon: Warden Fillers, MD;  Location: [facility] LD [facility]S;  Service: Obstetrics;;   female circumcision Bilateral    clitorectomy as well   FOOT SURGERY  2018   HAND SURGERY  2019    Family History  Problem Relation Age of Onset   Hyperlipidemia Mother    Hypertension Mother    Kidney disease Father    Alzheimer's disease Father     Social History   Socioeconomic History   Marital status: Married    Spouse name: Not on file   Number of children: Not on file   Years of education: Not on file   Highest education level: Not on file  Occupational History   Not on file  Tobacco Use   Smoking status: Never   Smokeless tobacco: Never  Vaping Use   Vaping status: Never Used  Substance and Sexual Activity   Alcohol use: Never  Drug use: Never   Sexual activity: Not Currently    Birth control/protection: None  Other Topics Concern   Not on file  Social History Narrative   ** Merged History Encounter **       ** Merged History Encounter **       Social Determinants of Health   Financial Resource Strain: High Risk (05/30/2021)   Overall Financial Resource Strain (CARDIA)    Difficulty of Paying Living Expenses: Very hard  Food Insecurity: Food Insecurity Present (03/12/2022)   Hunger Vital Sign    Worried About Running Out of Food in the Last Year: Sometimes true    Ran Out of Food in the Last Year: Sometimes true   Transportation Needs: Unmet Transportation Needs (03/12/2022)   PRAPARE - Transportation    Lack of Transportation (Medical): Yes    Lack of Transportation (Non-Medical): Yes  Physical Activity: Sufficiently Active (09/22/2023)   Exercise Vital Sign    Days of Exercise per Week: 5 days    Minutes of Exercise per Session: 30 min  Stress: No Stress Concern Present (09/22/2023)   [employer] of Occupational Health - Occupational Stress Questionnaire    Feeling of Stress : Not at all  Social Connections: Moderately Integrated (09/22/2023)   Social Connection and Isolation Panel [NHANES]    Frequency of Communication with Friends and Family: More than three times a week    Frequency of Social Gatherings with Friends and Family: More than three times a week    Attends Religious Services: 1 to 4 times per year    Active Member of [employer] or Organizations: No    Attends Banker Meetings: Never    Marital Status: Married  Catering manager Violence: Not At Risk (09/22/2023)   Humiliation, Afraid, Rape, and Kick questionnaire    Fear of Current or Ex-Partner: No    Emotionally Abused: No    Physically Abused: No    Sexually Abused: No    Review of Systems  All other systems reviewed and are negative.       Objective    BP 102/68 (BP Location: Right Arm, Patient Position: Sitting, Cuff Size: Normal)   Pulse 88   Temp 98.1 F (36.7 C) (Oral)   Resp 16   Ht 5\' 3"  (1.6 m)   Wt 130 lb 6.4 oz (59.1 kg)   LMP 08/25/2023 (Exact Date)   SpO2 98%   BMI 23.10 kg/m   Physical Exam Vitals and nursing note reviewed.  Constitutional:      General: She is not in acute distress. Cardiovascular:     Rate and Rhythm: Normal rate and regular rhythm.  Pulmonary:     Effort: Pulmonary effort is normal.     Breath sounds: Normal breath sounds.  Abdominal:     Palpations: Abdomen is soft.     Tenderness: There is no abdominal tenderness.  Musculoskeletal:     Left  shoulder: Tenderness present. No deformity. Decreased range of motion.  Neurological:     General: No focal deficit present.     Mental Status: She is alert and oriented to person, place, and time.         Assessment & Plan:  1. Uncontrolled type 2 diabetes mellitus with hypoglycemia without coma (HCC) Improved A1c but not at goal. Continue follow up with [facility].  - HgB A1c - Urine Albumin/Creatinine with ratio (send out) [LAB689]  2. Essential hypertension Appears stable. continue  3. Encounter for long-term (current) use of  insulin (HCC)   4. Left shoulder pain, unspecified chronicity Medrol dose pack prescribed. Tylenol/nsaids prn  Return in about 3 months (around 12/23/2023) for follow up.   Tommie Raymond, MD

## 2023-09-23 ENCOUNTER — Encounter: Payer: Self-pay | Admitting: Family Medicine

## 2023-09-23 LAB — MICROALBUMIN / CREATININE URINE RATIO
Creatinine, Urine: 36.5 mg/dL
Microalb/Creat Ratio: 19 mg/g{creat} (ref 0–29)
Microalbumin, Urine: 7.1 ug/mL

## 2023-09-28 ENCOUNTER — Encounter: Payer: Self-pay | Admitting: Pharmacist

## 2023-09-28 ENCOUNTER — Ambulatory Visit: Payer: Medicaid Other | Attending: Family Medicine | Admitting: Pharmacist

## 2023-09-28 DIAGNOSIS — Z794 Long term (current) use of insulin: Secondary | ICD-10-CM | POA: Diagnosis not present

## 2023-09-28 DIAGNOSIS — E10649 Type 1 diabetes mellitus with hypoglycemia without coma: Secondary | ICD-10-CM

## 2023-09-28 NOTE — Progress Notes (Signed)
    S:     No chief complaint on file.  37 y.o. female who presents for diabetes evaluation, education, and management. Patient was last seen by Primary Care Provider, Dr. Andrey Campanile, on 09/22/2023. A1c at that visit was 10.7% and UACR was 19 mg/g. Patient was last seen at Pharmacy clinic by [facility], on 08/31/2023. At that visit her insulin was adjusted.   PMH is significant for T1DM, Diabetic retinopathy, HTN  Patient arrives in  good spirits and presents without  any assistance. Patient reports using 34 units of lantus and 12 units novolog QID AC. Still endorses some hyperglycemia symptoms (tingling toes, fingers and muscle cramps). Patient denies polyuria and any vision changes.Patient reports  drinking a lot of water. Patient is using a CGM.   Family/Social History:  - Fhx: HTN, HLD, Kidney disease  - Never smoker   Current diabetes medications include: Lantus 32 units daily,  Novolog 12 units TID AC  Patient reports adherence to taking all medications as prescribed.   Insurance coverage: [place] Medicaid   Patient reports 1-2 hypoglycemic events last week, endorses sweatiness. CGM for last 30 days shows less than 1% of time in hypoglycemia.   Patient denies nocturia (nighttime urination).  Patient reports neuropathy (nerve pain). Patient denies visual changes.  Patient reported dietary habits: Eats 4 meals/day Patient-reported exercise habits: not discussed this visit   O:  Brings CGM (Dexcom G7) Clarity App for review.  30 days average: 246  30 days GMI: 9.2 % Blood glucose today: 139  Time and Range over 30 days: 29% 30 day % above 250: 47%   Lab Results  Component Value Date   HGBA1C 10.7 (A) 09/22/2023   There were no vitals filed for this visit.  Lipid Panel     Component Value Date/Time   CHOL 179 10/29/2019 0243   TRIG 77 10/29/2019 0243   HDL 35 (L) 10/29/2019 0243   CHOLHDL 5.1 10/29/2019 0243   VLDL 15 10/29/2019 0243   LDLCALC 129 (H) 10/29/2019 0243    Clinical Atherosclerotic Cardiovascular Disease (ASCVD): No  The ASCVD Risk score (Arnett DK, et al., 2019) failed to calculate for the following reasons:   The 2019 ASCVD risk score is only valid for ages 72 to 23   Patient is participating in a Managed Medicaid Plan:  Yes   A/P: Diabetes longstanding  currently uncontrolled. Patient is able to verbalize appropriate hypoglycemia management plan. Medication adherence appears appropriate. Control is suboptimal due to medication regimen.  -No medications were adjusted today  -Patient educated on purpose, proper use, and potential adverse effects of insulin.  -Extensively discussed pathophysiology of diabetes, recommended lifestyle interventions, dietary effects on blood sugar control.  -Counseled on s/sx of and management of hypoglycemia.  -Next A1c anticipated January 2025.    Written patient instructions provided. Patient verbalized understanding of treatment plan.  Total time in face to face counseling 30 minutes.    Follow-up:  Pharmacist 10/29/2023 PCP clinic visit in 12/24/2023 Endocrine 10/15/2023.   Patient seen with:  Erasmo Leventhal, PharmD Candidate  [employer]  Butch Penny, PharmD, [place], CPP Clinical Pharmacist [facility] [phone]

## 2023-10-01 ENCOUNTER — Encounter (INDEPENDENT_AMBULATORY_CARE_PROVIDER_SITE_OTHER): Payer: Medicaid Other | Admitting: Ophthalmology

## 2023-10-05 ENCOUNTER — Encounter (INDEPENDENT_AMBULATORY_CARE_PROVIDER_SITE_OTHER): Payer: Medicaid Other | Admitting: Ophthalmology

## 2023-10-05 NOTE — Progress Notes (Signed)
[facility] - Clinic Note  10/12/2023     CHIEF COMPLAINT Patient presents for Retina Follow Up   HISTORY OF PRESENT ILLNESS: Kristin Clay is a 37 y.o. female who presents to the clinic today for:   HPI     Retina Follow Up   Patient presents with  Diabetic Retinopathy.  In both eyes.  This started months ago.  Duration of 6 weeks.  Since onset it is stable.  I, the attending physician,  performed the HPI with the patient and updated documentation appropriately.        Comments   Patient feels that the vision is the same. She is not using eye drops. Her blood sugar was 223.       Last edited by Rennis Chris, MD on 10/12/2023 12:30 PM.    Patient feels the vision is doing well. The insulin dosage has decreased.   Referring physician: Aura Camps MD [address] [place], [place] [address]  HISTORICAL INFORMATION:   Selected notes from the MEDICAL RECORD NUMBER Referred by Dr. Karleen Hampshire for diabetic retinal evaluation LEE:  Ocular Hx- PMH-pregnancy, DM    CURRENT MEDICATIONS: No current outpatient medications on file. (Ophthalmic Drugs)   No current facility-administered medications for this visit. (Ophthalmic Drugs)   Current Outpatient Medications (Other)  Medication Sig   aspirin 81 MG chewable tablet Chew by mouth.   Continuous Glucose Receiver (DEXCOM G7 RECEIVER) DEVI Use to check blood sugar continuously throughout the day.   Continuous Glucose Sensor (DEXCOM G7 SENSOR) MISC Use to check blood sugar continuously throughout the day. Change sensors once every 10 days.   insulin aspart (NOVOLOG FLEXPEN) 100 UNIT/ML FlexPen Inject 12 Units into the skin 3 (three) times daily with meals.   insulin glargine (LANTUS SOLOSTAR) 100 UNIT/ML Solostar Pen Inject 32 Units into the skin daily.   Insulin Pen Needle 31G X 5 MM MISC 1 each by Does not apply route at bedtime.   labetalol (NORMODYNE) 300 MG tablet Take 1 tablet  (300 mg total) by mouth 2 (two) times daily.   methylPREDNISolone (MEDROL DOSEPAK) 4 MG TBPK tablet Take po daily as directed   NIFEdipine (ADALAT CC) 60 MG 24 hr tablet TAKE 1 TABLET BY MOUTH 2 TIMES DAILY.   Prenatal Vit-Fe Fumarate-FA (PREPLUS) 27-1 MG TABS Take 1 tablet by mouth daily.   No current facility-administered medications for this visit. (Other)   REVIEW OF SYSTEMS: ROS   Positive for: Endocrine, Eyes Negative for: Constitutional, Gastrointestinal, Neurological, Skin, Genitourinary, Musculoskeletal, HENT, Cardiovascular, Respiratory, Psychiatric, Allergic/Imm, Heme/Lymph Last edited by Charlette Caffey, COT on 10/12/2023  8:53 AM.      ALLERGIES Allergies  Allergen Reactions   Pork-Derived Products    PAST MEDICAL HISTORY Past Medical History:  Diagnosis Date   Chronic hypertension    Complication of anesthesia    with hand surgery, local anes did not work, tried twice- had to put her to sleep   Diabetic retinopathy (HCC)    DKA (diabetic ketoacidosis) (HCC) 10/2020   Epidural hematoma (HCC) 07/04/2021   Hypertension in pregnancy, preeclampsia, severe, delivered 12/15/2021   Obstetric vaginal laceration with type 3c third degree perineal laceration 06/29/2021   Pyelonephritis 10/2020   Rh negative status during pregnancy 05/01/2021   s/p Rhogam 05/13/21   Type I diabetes mellitus (HCC)    dx 31yrs ago   UTI (urinary tract infection)    Past Surgical History:  Procedure Laterality Date  CESAREAN SECTION  05/16/2022   Procedure: CESAREAN SECTION;  Surgeon: Warden Fillers, MD;  [facility]: [facility];  Service: Obstetrics;;   female circumcision Bilateral    clitorectomy as well   FOOT SURGERY  2018   HAND SURGERY  2019   FAMILY HISTORY Family History  Problem Relation Age of Onset   Hyperlipidemia Mother    Hypertension Mother    Kidney disease Father    Alzheimer's disease Father    SOCIAL HISTORY Social History   Tobacco Use   Smoking status:  Never   Smokeless tobacco: Never  Vaping Use   Vaping status: Never Used  Substance Use Topics   Alcohol use: Never   Drug use: Never       OPHTHALMIC EXAM:  Base Eye Exam     Visual Acuity (Snellen - Linear)       Right Left   Dist [name] 20/40 20/25 +2   Dist ph  20/20 +1 20/20 +1         Tonometry (Tonopen, 8:59 AM)       Right Left   Pressure 20 18         Pupils       Dark Light Shape React APD   Right 3 2 Round Brisk None   Left 3 2 Round Brisk None         Visual Fields       Left Right    Full Full         Extraocular Movement       Right Left    Full, Ortho Full, Ortho         Neuro/Psych     Oriented x3: Yes   Mood/Affect: Normal         Dilation     Both eyes: 1.0% Mydriacyl, 2.5% Phenylephrine @ 8:54 AM           Slit Lamp and Fundus Exam     Slit Lamp Exam       Right Left   Lids/Lashes normal normal   Conjunctiva/Sclera mild melanosis, Trace Injection, +corkscrew vessels nasally mild melanosis   Cornea trace PEE Clear   Anterior Chamber deep, clear, narrow temporal angle deep and quiet   Iris round and dilated, no NVI round and dilated, no NVI   Lens 1+Cortical changes Clear   Anterior Vitreous syneresis syneresis         Fundus Exam       Right Left   Disc pink and sharp; compact; mild hyperemia; +fibrosis, fine NVD -- regressing; +PPP pink and sharp; compact; +fibrosis w/ fine NVD -- regressing   C/D Ratio 0.2 0.2   Macula good foveal reflex, scattered MA/DBH, punctate exudates and cystic changes temporal macula-slightly increased, fine NVE inferior and temporal macula - regressing, ERM with striae nasally good foveal reflex, ERM with striae, scattered MA/DBH greatest temporal macula -- improving, exudates temporally, scattered fibrosis and NV, preretinal heme IT macula -- improved   Vessels attenuated, Tortuous, early NVE greatest inferior arcades -- ?regressing, Copper wiring attenuated, +NVE -- scattered,  +fibrosis, Tortuous   Periphery attached, 360 MA/DBH, good 360 PRP laser changes and posterior fill in -- some room for fill in, scattered NV -- regressing attached, 360 MA/DBH, good 360 PRP with room for fill in, scattered NV -- regressing, punctate fibrosis           IMAGING AND PROCEDURES  Imaging and Procedures for 10/12/2023  OCT, Retina - OU -  Both Eyes       Right Eye Quality was good. Central Foveal Thickness: 241. Progression has worsened. Findings include normal foveal contour, no SRF, intraretinal hyper-reflective material, intraretinal fluid, vitreomacular adhesion , preretinal fibrosis (Persistent IRF/IRHM IT macula--slightly increased; +PRF overlying disc and inferior macula).   Left Eye Quality was good. Central Foveal Thickness: 254. Progression has improved. Findings include normal foveal contour, no IRF, no SRF, epiretinal membrane, macular pucker, vitreous traction, preretinal fibrosis (Mild cystic changes inferior macula -- stably improved, pre retinal fibrosis overlying disc extending to macula w/ mild traction -- stable, interval improvement in pre retinal hyper reflective material IT macula-- resolved).   Notes *Images captured and stored on drive  Diagnosis / Impression:  PDR w/ DME OU OD: Persistent IRF/IRHM IT macula--slightly increased; +PRF overlying disc and inferior macula OS: Mild cystic changes inferior macula -- stably improved, pre retinal fibrosis overlying disc extending to macula w/ mild traction -- stable, interval improvement in pre retinal hyper reflective material IT macula  Clinical management:  See below  Abbreviations: NFP - Normal foveal profile. CME - cystoid macular edema. PED - pigment epithelial detachment. IRF - intraretinal fluid. SRF - subretinal fluid. EZ - ellipsoid zone. ERM - epiretinal membrane. ORA - outer retinal atrophy. ORT - outer retinal tubulation. SRHM - subretinal hyper-reflective material. IRHM - intraretinal  hyper-reflective material      Intravitreal Injection, Pharmacologic Agent - OD - Right Eye       Time Out 10/12/2023. 9:45 AM. Confirmed correct patient, procedure, site, and patient consented.   Anesthesia Topical anesthesia was used. Anesthetic medications included Lidocaine 2%, Proparacaine 0.5%.   Procedure Preparation included 5% betadine to ocular surface, eyelid speculum. A (32g) needle was used.   Injection: 1.25 mg Bevacizumab 1.25mg /0.63ml   Route: Intravitreal, Site: Right Eye   NDC: P3213405, Lot: [id number], Expiration date: 01/10/2024   Post-op Post injection exam found visual acuity of at least counting fingers. The patient tolerated the procedure well. There were no complications. The patient received written and verbal post procedure care education.      Intravitreal Injection, Pharmacologic Agent - OS - Left Eye       Time Out 10/12/2023. 9:45 AM. Confirmed correct patient, procedure, site, and patient consented.   Anesthesia Topical anesthesia was used. Anesthetic medications included Lidocaine 2%, Proparacaine 0.5%.   Procedure Preparation included 5% betadine to ocular surface, eyelid speculum. A (32g) needle was used.   Injection: 1.25 mg Bevacizumab 1.25mg /0.31ml   Route: Intravitreal, Site: Left Eye   NDC: P3213405, Lot: [id number], Expiration date: 01/16/2024   Post-op Post injection exam found visual acuity of at least counting fingers. The patient tolerated the procedure well. There were no complications. The patient received written and verbal post procedure care education. Post injection medications were not given.            ASSESSMENT/PLAN:    ICD-10-CM   1. 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 Current use of insulin (HCC)  Z79.4     3. Epiretinal membrane (ERM) of both eyes  H35.373     4. Essential hypertension  I10  5. Hypertensive retinopathy of both eyes  H35.033     6. High-risk pregnancy in second trimester  O09.92      1-3. 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, both eyes - last A1c 10.7 (10.20.224), 13.9 (08.30.24), 10.9 (02.16.24), 14.5 (09/01/22), 7.4 (06.06.23)  - delayed to follow up from 4 weeks to 9 weeks (02.05.24 to 04.09.24)  - s/p PRP OD (03.27.23), fill in (05.15.23)  - s/p PRP OS (04.17.23), fill in (10.20.23) - s/p IVA OS #1 (12.08.23), #2 (01.08.24), #3 (04.09.24), #4 (05.21.24), #5 (07.01.24),  #6 (8.12.24), #7 (09.27.24) - s/p IVA OD #1 (02.05.24), #2 (04.09.24), #3 (05.21.24), #4 (07.01.24), #5 (08.12.24), #6 (09.27.24) - BCVA 20/20 OU - improved - FA 10.20.23 shows 360 midzonal NVE w/ leakage OU - OCT shows OD: Persistent IRF/IRHM IT macula--slightly increased +PRF overlying disc and inferior macula; OS: Mild cystic changes inferior macula -- stably improved, pre retinal fibrosis overlying disc extending to macula w/ mild traction -- stable, interval improvement in pre retinal hyper reflective material IT macula at 7.5 weeks - recommend IVA OU (OD #7 and OS #8) today, 11.19.24 with follow up in 6 weeks - pt wishes to proceed with injection - RBA of procedure discussed, questions answered - IVA informed consent obtained and signed, 12.08.23 (OU) - see procedure note - approved for Eylea for 2024 - f/u 6 weeks -- DFE/OCT, possible injection  4-6. History of high risk pregnancy w/ +HTN and hypertensive retinopathy - s/p Cesarean 06.24.23 (baby boy) - 33 weeks and 1 day, 3lbs, now out of the NICU and at home - history of pre-eclampsia - BP improved since delivery - discussed importance of tight BP control  - continue to monitor  Ophthalmic Meds  Ordered this visit:  Meds ordered this encounter  Medications   Bevacizumab (AVASTIN) SOLN 1.25 mg   Bevacizumab (AVASTIN) SOLN 1.25 mg     Return in about 6 weeks (around 11/23/2023) for f/u PDR OU , DFE, OCT, Possible, IVA, OU.  There are no Patient Instructions on file for this visit.  Explained the diagnoses, plan, and follow up with the patient and they expressed understanding.  Patient expressed understanding of the importance of proper follow up care.   This document serves as a record of services personally performed by Karie Chimera, MD, PhD. It was created on their behalf by Charlette Caffey, COT an ophthalmic technician. The creation of this record is the provider's dictation and/or activities during the visit.    Electronically signed by:  Charlette Caffey, COT  10/12/23 12:30 PM  Karie Chimera, M.D., Ph.D. Diseases & Surgery of the Retina and Vitreous [facility]  I have reviewed the above documentation for accuracy and completeness, and I agree with the above. Karie Chimera, M.D., Ph.D. 10/12/23 12:31 PM   Abbreviations: M myopia (nearsighted); A astigmatism; H hyperopia (farsighted); P presbyopia; Mrx spectacle prescription;  CTL contact lenses; OD right eye; OS left eye; OU both eyes  XT exotropia; ET esotropia; PEK punctate epithelial keratitis; PEE punctate epithelial erosions; DES dry eye syndrome; MGD meibomian gland dysfunction; ATs artificial tears; PFAT's preservative free artificial tears; NSC nuclear sclerotic cataract; PSC posterior subcapsular cataract; ERM epi-retinal membrane; PVD posterior vitreous detachment; RD retinal detachment; DM diabetes mellitus; DR diabetic retinopathy; NPDR non-proliferative diabetic retinopathy; PDR proliferative diabetic retinopathy; CSME clinically significant macular edema; DME diabetic macular edema; dbh dot blot hemorrhages; CWS cotton wool spot; POAG primary open angle glaucoma; C/D  cup-to-disc  ratio; HVF humphrey visual field; GVF goldmann visual field; OCT optical coherence tomography; IOP intraocular pressure; BRVO Branch retinal vein occlusion; CRVO central retinal vein occlusion; CRAO central retinal artery occlusion; BRAO branch retinal artery occlusion; RT retinal tear; SB scleral buckle; PPV pars plana vitrectomy; VH Vitreous hemorrhage; PRP panretinal laser photocoagulation; IVK intravitreal kenalog; VMT vitreomacular traction; MH Macular hole;  NVD neovascularization of the disc; NVE neovascularization elsewhere; AREDS age related eye disease study; ARMD age related macular degeneration; POAG primary open angle glaucoma; EBMD epithelial/anterior basement membrane dystrophy; ACIOL anterior chamber intraocular lens; IOL intraocular lens; PCIOL posterior chamber intraocular lens; Phaco/IOL phacoemulsification with intraocular lens placement; PRK photorefractive keratectomy; LASIK laser assisted in situ keratomileusis; HTN hypertension; DM diabetes mellitus; COPD chronic obstructive pulmonary disease

## 2023-10-08 ENCOUNTER — Other Ambulatory Visit: Payer: Self-pay

## 2023-10-08 ENCOUNTER — Encounter (INDEPENDENT_AMBULATORY_CARE_PROVIDER_SITE_OTHER): Payer: Medicaid Other | Admitting: Ophthalmology

## 2023-10-12 ENCOUNTER — Encounter (INDEPENDENT_AMBULATORY_CARE_PROVIDER_SITE_OTHER): Payer: Self-pay | Admitting: Ophthalmology

## 2023-10-12 ENCOUNTER — Ambulatory Visit (INDEPENDENT_AMBULATORY_CARE_PROVIDER_SITE_OTHER): Payer: Medicaid Other | Admitting: Ophthalmology

## 2023-10-12 DIAGNOSIS — H35373 Puckering of macula, bilateral: Secondary | ICD-10-CM | POA: Diagnosis not present

## 2023-10-12 DIAGNOSIS — I10 Essential (primary) hypertension: Secondary | ICD-10-CM

## 2023-10-12 DIAGNOSIS — E103513 Type 1 diabetes mellitus with proliferative diabetic retinopathy with macular edema, bilateral: Secondary | ICD-10-CM | POA: Diagnosis not present

## 2023-10-12 DIAGNOSIS — H35033 Hypertensive retinopathy, bilateral: Secondary | ICD-10-CM

## 2023-10-12 DIAGNOSIS — O0992 Supervision of high risk pregnancy, unspecified, second trimester: Secondary | ICD-10-CM

## 2023-10-12 DIAGNOSIS — Z794 Long term (current) use of insulin: Secondary | ICD-10-CM

## 2023-10-12 MED ORDER — BEVACIZUMAB CHEMO INJECTION 1.25MG/0.05ML SYRINGE FOR KALEIDOSCOPE
1.2500 mg | INTRAVITREAL | Status: AC | PRN
  Administered 2023-10-12: 1.25 mg via INTRAVITREAL

## 2023-10-15 ENCOUNTER — Encounter: Payer: Self-pay | Admitting: Internal Medicine

## 2023-10-15 ENCOUNTER — Ambulatory Visit (INDEPENDENT_AMBULATORY_CARE_PROVIDER_SITE_OTHER): Payer: Medicaid Other | Admitting: Internal Medicine

## 2023-10-15 ENCOUNTER — Other Ambulatory Visit: Payer: Self-pay | Admitting: Internal Medicine

## 2023-10-15 ENCOUNTER — Telehealth: Payer: Self-pay

## 2023-10-15 VITALS — BP 120/80 | HR 76 | Ht 63.0 in | Wt 130.0 lb

## 2023-10-15 DIAGNOSIS — E103592 Type 1 diabetes mellitus with proliferative diabetic retinopathy without macular edema, left eye: Secondary | ICD-10-CM | POA: Diagnosis not present

## 2023-10-15 DIAGNOSIS — E1042 Type 1 diabetes mellitus with diabetic polyneuropathy: Secondary | ICD-10-CM | POA: Diagnosis not present

## 2023-10-15 DIAGNOSIS — E1065 Type 1 diabetes mellitus with hyperglycemia: Secondary | ICD-10-CM

## 2023-10-15 MED ORDER — OMNIPOD 5 G7 PODS (GEN 5) MISC: 1.0000 | 3 refills | Status: DC

## 2023-10-15 MED ORDER — OMNIPOD 5 G7 INTRO (GEN 5) KIT: 1.0000 | PACK | 0 refills | Status: DC

## 2023-10-15 NOTE — Patient Instructions (Addendum)
Continue Lantus 34 units daily  Continue  Humalog 12 units with each meal  Novolog correctional insulin: ADD extra units on insulin to your meal-time Novolog dose if your blood sugars are higher than 180. Use the scale below to help guide you:   Blood sugar before meal Number of units to inject  Less than 180 0 unit  181- 230 1 units  231 - 280 2 units  281 - 330 3 units  331 - 380 4 units  381 - 430 5 units  431 - 480 6 units  481 - 530 7 units      HOW TO TREAT LOW BLOOD SUGARS (Blood sugar LESS THAN 70 MG/DL) Please follow the RULE OF 15 for the treatment of hypoglycemia treatment (when your (blood sugars are less than 70 mg/dL)   STEP 1: Take 15 grams of carbohydrates when your blood sugar is low, which includes:  3-4 GLUCOSE TABS  OR 3-4 OZ OF JUICE OR REGULAR SODA OR ONE TUBE OF GLUCOSE GEL    STEP 2: RECHECK blood sugar in 15 MINUTES STEP 3: If your blood sugar is still low at the 15 minute recheck --> then, go back to STEP 1 and treat AGAIN with another 15 grams of carbohydrates.

## 2023-10-15 NOTE — Progress Notes (Signed)
Name: Kristin Clay  MRN/ DOB: [id number], 06-Dec-1985   Age/ Sex: 37 y.o., female    PCP: Georganna Skeans, MD   Reason for Endocrinology Evaluation: Type 1 Diabetes Mellitus     Date of Initial Endocrinology Visit: 03/18/2022    PATIENT IDENTIFIER: Kristin Clay is a 37 y.o. female with a past medical history of T1 DM. The patient presented for initial endocrinology clinic visit on 03/18/2022 for consultative assistance with her diabetes management.    HPI: Ms. Floresca was    Diagnosed with T1DM at age 89          Hemoglobin A1c has ranged from 8.4% in 2023, peaking at 13.0% in 2020. Patient has required hospitalization within the last 1 year from hyper or hypoglycemia: Yes 02/2022 with DKA  She is currently 24.5 weeks of gestation with a boy  She has 64 months old baby   Paternal aunts with T1 diabetes   Pt with tingling of hands and feet, she has partial ring finger amputations B/L    S/P delivery 6/27/2023[facility]    SUBJECTIVE:   During the last visit (01/08/2023): A1c 10.9%      Today (10/15/23): Kristin Clay is here for a follow up on diabetes management.  She checks glucose multiple times daily through Dexcom.  She has not been on the pump since 03/2023, her phone did not support the app She was seen by her PCP last month for shoulder pain, was prescribed Medrol Dosepak  Denies nauea or vomiting  Denies constipation or diarrhea  She forget lantus 2 days ago  Patient is complaining of burning sensation of the feet, she also has noted onycholysis   HOME DIABETES REGIMEN: Lantus 34 units Humalog 12 units 3 times daily before every meal    CONTINUOUS GLUCOSE MONITORING RECORD INTERPRETATION    Dates of Recording: 11/9-11/22/2024  Sensor description:dexcom  Results statistics:   CGM use % of time 43  Average and SD 227/93  Time in range     36   %  % Time Above 180 25  % Time above 250 38  % Time Below target 1    Glycemic patterns summary: BGs are high throughout the day and the night  Hyperglycemic episodes postprandial  Hypoglycemic episodes occurred after bolus  Overnight periods: Variable   DIABETIC COMPLICATIONS: Microvascular complications:  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 Denies: CKD  Last eye exam: Completed 10/12/2023  Macrovascular complications:   Denies: CAD, PVD, CVA   PAST HISTORY: Past Medical History:  Past Medical History:  Diagnosis Date   Chronic hypertension    Complication of anesthesia    with hand surgery, local anes did not work, tried twice- had to put her to sleep   Diabetic retinopathy (HCC)    DKA (diabetic ketoacidosis) (HCC) 10/2020   Epidural hematoma (HCC) 07/04/2021   Hypertension in pregnancy, preeclampsia, severe, delivered 12/15/2021   Obstetric vaginal laceration with type 3c third degree perineal laceration 06/29/2021   Pyelonephritis 10/2020   Rh negative status during pregnancy 05/01/2021   s/p Rhogam 05/13/21   Type I diabetes mellitus (HCC)    dx 22yrs ago   UTI (urinary tract infection)    Past Surgical History:  Past Surgical History:  Procedure Laterality Date   CESAREAN SECTION  05/16/2022   Procedure: CESAREAN SECTION;  Surgeon: Warden Fillers, MD;  Location: [facility];  Service: Obstetrics;;   female circumcision Bilateral    clitorectomy as well  FOOT SURGERY  2018   HAND SURGERY  2019    Social History:  reports that she has never smoked. She has never used smokeless tobacco. She reports that she does not drink alcohol and does not use drugs. Family History:  Family History  Problem Relation Age of Onset   Hyperlipidemia Mother    Hypertension Mother    Kidney disease Father    Alzheimer's disease Father      HOME MEDICATIONS: Allergies as of 10/15/2023       Reactions   Pork-derived Products         Medication List        Accurate as of October 15, 2023  8:29 AM. If you have any questions, ask your  nurse or doctor.          aspirin 81 MG chewable tablet Chew by mouth.   Dexcom G7 Receiver Devi Use to check blood sugar continuously throughout the day.   Dexcom G7 Sensor Misc Use to check blood sugar continuously throughout the day. Change sensors once every 10 days.   fluconazole 150 MG tablet Commonly known as: DIFLUCAN Take 150 mg by mouth once.   Insulin Pen Needle 31G X 5 MM Misc 1 each by Does not apply route at bedtime.   labetalol 300 MG tablet Commonly known as: NORMODYNE Take 1 tablet (300 mg total) by mouth 2 (two) times daily.   Lantus SoloStar 100 UNIT/ML Solostar Pen Generic drug: insulin glargine Inject 32 Units into the skin daily.   methylPREDNISolone 4 MG Tbpk tablet Commonly known as: MEDROL DOSEPAK Take po daily as directed   NIFEdipine 60 MG 24 hr tablet Commonly known as: ADALAT CC TAKE 1 TABLET BY MOUTH 2 TIMES DAILY.   NovoLOG FlexPen 100 UNIT/ML FlexPen Generic drug: insulin aspart Inject 12 Units into the skin 3 (three) times daily with meals.   PrePLUS 27-1 MG Tabs Take 1 tablet by mouth daily.         ALLERGIES: Allergies  Allergen Reactions   Pork-Derived Products      REVIEW OF SYSTEMS: A comprehensive ROS was conducted with the patient and is negative except as per HPI    OBJECTIVE:   VITAL SIGNS: BP 120/80 (BP Location: Left Arm, Patient Position: Sitting, Cuff Size: Small)   Pulse 76   Ht 5\' 3"  (1.6 m)   Wt 130 lb (59 kg)   SpO2 99%   BMI 23.03 kg/m    PHYSICAL EXAM:  General: Pt appears well and is in NAD  Lungs: Clear with good BS bilat with no rales, rhonchi, or wheezes  Heart: RRR   Extremities:  Lower extremities - No pretibial edema.  Neuro: MS is good with appropriate affect, pt is alert and Ox3    DM foot exam: 10/15/2023  The skin of the feet is without sores or ulcerations. The pedal pulses are 2+ on right and 2+ on left. The sensation is decreased to a screening 5.07, 10 gram  monofilament at the left great toe   DATA REVIEWED:  Lab Results  Component Value Date   HGBA1C 10.7 (A) 09/22/2023   HGBA1C 13.9 (A) 07/23/2023   HGBA1C 10.9 (A) 01/08/2023      ASSESSMENT / PLAN / RECOMMENDATIONS:   1) Type 1 Diabetes Mellitus, poorly controlled, With neuropathic, and retinopathic and neuropathic complications - Most recent A1c of  10.9 %. Goal A1c < 7.0%.    -Patient continues with hyperglycemia as well as glycemic excursions -Patient  with imperfect adherence to medication, she also admits to sometimes taking prandial insulin without eating, I again emphasized the importance of taking prandial dose of insulin before the meal to prevent hypoglycemia -She tells me the correction scale is too strong, I will adjust her sensitivity factor as below -She would like to go back on the OmniPod as her phone is not compatible with the Dexcom, a new prescription and a new referral to our CDE for retraining has been placed  MEDICATIONS: Continue Lantus 34 units daily Continue Humalog 12 units with each meal Change CF : Humalog (BG-130/50) TIDQAC  EDUCATION / INSTRUCTIONS: BG monitoring instructions: Patient is instructed to check her blood sugars 4 times a day, fasting and 2 hours post meals. Call [facility] if: BG persistently < 60  I reviewed the Rule of 15 for the treatment of hypoglycemia in detail with the patient. Literature supplied.   2) Diabetic complications:  Eye: Does  have known diabetic retinopathy.  Neuro/ Feet: Does  have known diabetic peripheral neuropathy. Renal: Patient does not have known baseline CKD. She is not on an ACEI/ARB at present.  3. Peripheral Neuropathy/Onycholysis :  -Referral to podiatry will be placed  Follow-up in 3 months  I spent 30 minutes preparing to see the patient by review of recent labs, imaging and procedures, obtaining and reviewing separately obtained history, communicating with the patient, ordering  medications, tests or procedures, and documenting clinical information in the EHR including the differential Dx, treatment, and any further evaluation and other management    Signed electronically by: Lyndle Herrlich, MD  [facility]  [facility] [address] [place] [place], [place] [address] Phone: [phone] FAX: [phone]   CC: Georganna Skeans, MD [address] [place] [address] Phone: [phone]  Fax: [phone]    Return to Endocrinology clinic as below: Future Appointments  Date Time Provider Department Center  10/29/2023 10:00 AM Drucilla Chalet, RPH-CPP [facility] None  11/23/2023  9:00 AM Rennis Chris, MD [facility] None  12/24/2023  9:20 AM Georganna Skeans, MD [facility] None

## 2023-10-15 NOTE — Telephone Encounter (Signed)
PA needed for Omnipod Kit and Pods

## 2023-10-18 ENCOUNTER — Telehealth: Payer: Self-pay

## 2023-10-18 ENCOUNTER — Other Ambulatory Visit (HOSPITAL_COMMUNITY): Payer: Self-pay

## 2023-10-18 NOTE — Telephone Encounter (Signed)
Pharmacy Patient Advocate Encounter   Received notification from Pt Calls Messages that prior authorization for Omnipod kit and pods is required/requested.   Insurance verification completed.   The patient is insured through [facility] .   Per test claim: Kit is Refill too soon. PA is not needed at this time. Medication was filled 12/05/22. Next eligible fill date is 10/30/23. Only one fill allowed per year for the kit.  Test claim for Omnipods was successful. $4 copay for 30 day supply.

## 2023-10-22 ENCOUNTER — Other Ambulatory Visit: Payer: Self-pay | Admitting: Family Medicine

## 2023-10-29 ENCOUNTER — Encounter: Payer: Self-pay | Admitting: Pharmacist

## 2023-10-29 ENCOUNTER — Other Ambulatory Visit: Payer: Self-pay

## 2023-10-29 ENCOUNTER — Ambulatory Visit: Payer: Medicaid Other | Attending: Family Medicine | Admitting: Pharmacist

## 2023-10-29 DIAGNOSIS — E1042 Type 1 diabetes mellitus with diabetic polyneuropathy: Secondary | ICD-10-CM

## 2023-10-29 DIAGNOSIS — Z794 Long term (current) use of insulin: Secondary | ICD-10-CM

## 2023-10-29 NOTE — Progress Notes (Signed)
Robley Fries #[id number] S:     No chief complaint on file.  37 y.o. female who presents for diabetes evaluation, education, and management. Patient was last seen by Primary Care Provider, Dr. Andrey Campanile, on 09/22/2023. A1c at that visit was 10.7%.   Patient was last seen at Pharmacy clinic on 09/28/2023. We did not adjust her medications at that visit. She has since established with Endocrine. She saw Dr. Lonzo Cloud on 10/15/2023 where she endorsed imperfect adherence with Lantus 34u daily + Humalog 12u TID before meals. Unfortunately, her CGM report showed that her CGM was only active 43% of the time and she was only in a goal glucose range 36% of time between 11/9-11/22/2024. Her Humalog was adjusted for a CF - goal glucose of 130 with 1 unit for every 50 mg/dL above 180mg /dL as follows:   -Novolog 12 units with each meal  -Novolog correctional insulin: ADD extra units on insulin to your meal-time Novolog dose if your blood sugars are higher than 180. Use the scale below to help guide you:    Blood sugar before meal Number of units to inject  Less than 180 0 unit  181- 230 1 units  231 - 280 2 units  281 - 330 3 units  331 - 380 4 units  381 - 430 5 units  431 - 480 6 units  481 - 530 7 units    Today, patient arrives in good spirits and presents without  any assistance. Patient reports using 34 units of lantus and 12 units novolog QID AC + her correction factor. She was able to get her OmniPod supplies but has not seen the CDE at Endo to set this up. She plans to make that appt after completing her appt with me today. Tells me today her neuropathy and muscle cramping has improved since last visit. Patient denies polyuria and any vision changes. Patient reports drinking a lot of water. Patient is using a CGM.  Family/Social History:  - Fhx: HTN, HLD, Kidney disease  - Never smoker   Current diabetes medications include: Lantus 32 units daily,  Novolog 12 units TID AC + CF (130/50) as prescribed  by Endocrinology. Patient reports adherence to taking all medications as prescribed.   Insurance coverage: [place] Medicaid   Patient endorses occasional hypoglycemic events but these have improved since her last visit. This is not a concern for her today. She is doing better eating after administering insulin.   Patient denies nocturia (nighttime urination).  Patient reports neuropathy (nerve pain). Patient denies visual changes.  Patient reported dietary habits: Eats 4 meals/day Patient-reported exercise habits: not discussed this visit   O:  Brings CGM (Dexcom G7) Clarity App for review. She tells me  14 day average: 211  Time in range over 14 days: 38% 14 day % above 250: 28% Out of sensors - next fill is 11/07/23. Tells me today that one fell off.   Lab Results  Component Value Date   HGBA1C 10.7 (A) 09/22/2023   There were no vitals filed for this visit.  Lipid Panel     Component Value Date/Time   CHOL 179 10/29/2019 0243   TRIG 77 10/29/2019 0243   HDL 35 (L) 10/29/2019 0243   CHOLHDL 5.1 10/29/2019 0243   VLDL 15 10/29/2019 0243   LDLCALC 129 (H) 10/29/2019 0243   Clinical Atherosclerotic Cardiovascular Disease (ASCVD): No  The ASCVD Risk score (Arnett DK, et al., 2019) failed to calculate for the following reasons:  The 2019 ASCVD risk score is only valid for ages 69 to 81   Patient is participating in a Managed Medicaid Plan:  Yes   A/P: Diabetes longstanding  currently uncontrolled, however, she is now est with Endo and is going to make a CDE RN appt to set up OmniPod. Will hold off on any additional changes at this time and defer further management to Endo. Patient is able to verbalize appropriate hypoglycemia management plan, and her hypo episodes have improved since she was seen by Endo last month. Medication adherence appears appropriate. Control is suboptimal due to medication regimen.  -No medications were adjusted today  -Patient educated on purpose,  proper use, and potential adverse effects of insulin.  -Extensively discussed pathophysiology of diabetes, recommended lifestyle interventions, dietary effects on blood sugar control.  -Counseled on s/sx of and management of hypoglycemia.  -Next A1c anticipated January 2025.    Written patient instructions provided. Patient verbalized understanding of treatment plan.  Total time in face to face counseling 30 minutes.    Follow-up:  Pharmacist prn. PCP clinic visit in 12/24/2023 Endocrine in Feb. CDE asap to set up OmniPod.  Butch Penny, PharmD, Patsy [place], CPP Clinical Pharmacist [facility] [phone]

## 2023-11-01 ENCOUNTER — Telehealth: Payer: Self-pay

## 2023-11-01 ENCOUNTER — Other Ambulatory Visit: Payer: Self-pay

## 2023-11-01 NOTE — Telephone Encounter (Signed)
Pharmacy Patient Advocate Encounter   Received notification from CoverMyMeds that prior authorization for [facility] G7 SENSOR is required/requested.   Insurance verification completed.   The patient is insured through [facility] MEDICAID .   Per test claim: PA required; PA submitted to above mentioned insurance via CoverMyMeds Key/confirmation #/EOC [id number] Status is pending

## 2023-11-02 ENCOUNTER — Other Ambulatory Visit: Payer: Self-pay

## 2023-11-03 ENCOUNTER — Other Ambulatory Visit: Payer: Self-pay

## 2023-11-03 ENCOUNTER — Encounter: Payer: Medicaid Other | Attending: Internal Medicine | Admitting: Nutrition

## 2023-11-03 DIAGNOSIS — E1042 Type 1 diabetes mellitus with diabetic polyneuropathy: Secondary | ICD-10-CM | POA: Diagnosis present

## 2023-11-03 MED ORDER — INSULIN ASPART 100 UNIT/ML IJ SOLN
70.0000 [IU] | Freq: Every day | INTRAMUSCULAR | 3 refills | Status: DC
  Filled 2023-11-03: qty 20, 28d supply, fill #0

## 2023-11-03 NOTE — Telephone Encounter (Signed)
Pharmacy Patient Advocate Encounter  Received notification from [facility] that Prior Authorization for [facility] G7 SENSOR has been APPROVED from 11/01/2023 to 10/31/2024   PA #/Case ID/Reference #: [id number]

## 2023-11-03 NOTE — Progress Notes (Unsigned)
Kristin Clay was started back on her OmniPod 5 insulin pump.  Settings were put in per DR. Shamleffer's orders:  Basal rate: 1.0u/hr, ISF: 50, target 120 with correction over 170, I/C: 1 (she takes 12u ac meals and 1-2u ac snack).  Max basal rate: 2.0, max bolus 20u.  She took her Lantus this morning at 7AM.  The pump was put in a 100% basal reduction mode for 12 hours.  She was shown how to put the pump back in automated mode.She filled a pod with Novolog insulin, and attached this to her right abdomen.  We reviewed the need for the pod to be in direct view of the sensor for good connection.    She reported good understanding of this.  I will call her tomorrow to see how she is doing.  We reviewed how to bolus, how to link the dexcom cgm to her PDM, and how/when to do a correction dose.  She had no final questions.

## 2023-11-03 NOTE — Telephone Encounter (Signed)
Novolog vials sent to [facility] per Bonita Quin for patient in office for pump training

## 2023-11-04 ENCOUNTER — Telehealth: Payer: Self-pay | Admitting: Nutrition

## 2023-11-04 ENCOUNTER — Other Ambulatory Visit: Payer: Self-pay

## 2023-11-04 NOTE — Patient Instructions (Signed)
Call office if blood sugars remain high or drop below 70.   Call OmniPod help line if questions about how to use the pump

## 2023-11-04 NOTE — Telephone Encounter (Signed)
I called her and when she answered and found out who I was, she hung up on me.  I tried calling back x3 and it went to voice mail, which was not set up.  I called again and someone came on the phone and told me this was not her number.  I tried calling the home number and the voice mail said the phone does not have a voice Jordan that is sent up as yet. I then tried to call her old number and this does not work.  No connection.

## 2023-11-11 NOTE — Progress Notes (Signed)
 [facility] - Clinic Note  11/23/2023     CHIEF COMPLAINT Patient presents for Retina Follow Up   HISTORY OF PRESENT ILLNESS: Kristin Clay is a 37 y.o. female who presents to the clinic today for:   HPI     Retina Follow Up   Patient presents with  Diabetic Retinopathy.  In both eyes.  This started 6 weeks ago.  I, the attending physician,  performed the HPI with the patient and updated documentation appropriately.        Comments   Patient here for 6 weeks retina follow up for PDR OU. Patient states vision good. No eye pain.       Last edited by Valdemar Rogue, MD on 11/23/2023  6:26 PM.     Patient feels the vision is doing well.   Referring physician: Jacques Sharper MD [address], [address] [place], [place] [address]  HISTORICAL INFORMATION:   Selected notes from the MEDICAL RECORD NUMBER Referred by Dr. Jacques for diabetic retinal evaluation LEE:  Ocular Hx- PMH-pregnancy, DM    CURRENT MEDICATIONS: No current outpatient medications on file. (Ophthalmic Drugs)   No current facility-administered medications for this visit. (Ophthalmic Drugs)   Current Outpatient Medications (Other)  Medication Sig   aspirin  81 MG chewable tablet Chew by mouth.   Continuous Glucose Receiver (DEXCOM G7 RECEIVER) DEVI Use to check blood sugar continuously throughout the day.   Continuous Glucose Sensor (DEXCOM G7 SENSOR) MISC Use to check blood sugar continuously throughout the day. Change sensors once every 10 days.   fluconazole  (DIFLUCAN ) 150 MG tablet Take 150 mg by mouth once.   insulin  aspart (NOVOLOG  FLEXPEN) 100 UNIT/ML FlexPen Inject 12 Units into the skin 3 (three) times daily with meals.   insulin  aspart (NOVOLOG ) 100 UNIT/ML injection Inject 70 units max daily via insulin  pump   Insulin  Disposable Pump (OMNIPOD 5 G7 INTRO, GEN 5,) KIT 1 Device by Does not apply route every other day.   Insulin  Disposable Pump (OMNIPOD 5 G7  PODS, GEN 5,) MISC 1 Device by Does not apply route every other day.   insulin  glargine (LANTUS  SOLOSTAR) 100 UNIT/ML Solostar Pen Inject 34 Units into the skin daily.   Insulin  Pen Needle 31G X 5 MM MISC 1 each by Does not apply route at bedtime.   labetalol  (NORMODYNE ) 300 MG tablet Take 1 tablet (300 mg total) by mouth 2 (two) times daily.   methylPREDNISolone  (MEDROL  DOSEPAK) 4 MG TBPK tablet Take po daily as directed   NIFEdipine  (ADALAT  CC) 60 MG 24 hr tablet TAKE 1 TABLET BY MOUTH 2 TIMES DAILY.   Prenatal Vit-Fe Fumarate-FA (PREPLUS) 27-1 MG TABS Take 1 tablet by mouth daily.   No current facility-administered medications for this visit. (Other)   REVIEW OF SYSTEMS: ROS   Positive for: Endocrine, Eyes Negative for: Constitutional, Gastrointestinal, Neurological, Skin, Genitourinary, Musculoskeletal, HENT, Cardiovascular, Respiratory, Psychiatric, Allergic/Imm, Heme/Lymph Last edited by Orval Asberry RAMAN, COA on 11/23/2023  9:01 AM.       ALLERGIES Allergies  Allergen Reactions   Pork-Derived Products    PAST MEDICAL HISTORY Past Medical History:  Diagnosis Date   Chronic hypertension    Complication of anesthesia    with hand surgery, local anes did not work, tried twice- had to put her to sleep   Diabetic retinopathy (HCC)    DKA (diabetic ketoacidosis) (HCC) 10/2020   Epidural hematoma (HCC) 07/04/2021   Hypertension in pregnancy, preeclampsia, severe, delivered  12/15/2021   Obstetric vaginal laceration with type 3c third degree perineal laceration 06/29/2021   Pyelonephritis 10/2020   Rh negative status during pregnancy 05/01/2021   s/p Rhogam 05/13/21   Type I diabetes mellitus (HCC)    dx 39yrs ago   UTI (urinary tract infection)    Past Surgical History:  Procedure Laterality Date   CESAREAN SECTION  05/16/2022   Procedure: CESAREAN SECTION;  Surgeon: Zina Jerilynn LABOR, MD;  Location: [facility] LD [facility]S;  Service: Obstetrics;;   female circumcision Bilateral     clitorectomy as well   FOOT SURGERY  2018   HAND SURGERY  2019   FAMILY HISTORY Family History  Problem Relation Age of Onset   Hyperlipidemia Mother    Hypertension Mother    Kidney disease Father    Alzheimer's disease Father    SOCIAL HISTORY Social History   Tobacco Use   Smoking status: Never   Smokeless tobacco: Never  Vaping Use   Vaping status: Never Used  Substance Use Topics   Alcohol use: Never   Drug use: Never       OPHTHALMIC EXAM:  Base Eye Exam     Visual Acuity (Snellen - Linear)       Right Left   Dist [place] 20/25 -2 20/20   Dist ph [place] NI NI         Tonometry (Tonopen, 9:00 AM)       Right Left   Pressure 15 16         Pupils       Dark Light Shape React APD   Right 3 2 Round Brisk None   Left 3 2 Round Brisk None         Visual Fields (Counting fingers)       Left Right    Full Full         Extraocular Movement       Right Left    Full, Ortho Full, Ortho         Neuro/Psych     Oriented x3: Yes   Mood/Affect: Normal         Dilation     Both eyes: 1.0% Mydriacyl, 2.5% Phenylephrine  @ 9:00 AM           Slit Lamp and Fundus Exam     Slit Lamp Exam       Right Left   Lids/Lashes normal normal   Conjunctiva/Sclera mild melanosis, Trace Injection, +corkscrew vessels nasally mild melanosis   Cornea trace PEE Clear   Anterior Chamber deep, clear, narrow temporal angle deep and quiet   Iris round and dilated, no NVI round and dilated, no NVI   Lens 1+Cortical changes Clear   Anterior Vitreous syneresis syneresis         Fundus Exam       Right Left   Disc pink and sharp; compact; mild hyperemia; +fibrosis, fine NVD -- regressing; +PPP pink and sharp; compact; +fibrosis w/ fine NVD -- regressing   C/D Ratio 0.2 0.2   Macula good foveal reflex, scattered MA/DBH, punctate exudates and cystic changes temporal macula-slightly improved, fine NVE inferior and temporal macula - regressing, ERM with striae  nasally good foveal reflex, ERM with striae, scattered MA/DBH greatest temporal macula -- improving, exudates temporally, scattered fibrosis and NV, preretinal heme IT macula -- improved   Vessels attenuated, Tortuous, early NVE greatest inferior arcades -- ?regressing, Copper wiring attenuated, +NVE -- scattered, +fibrosis, Tortuous   Periphery attached, 360  MA/DBH, good 360 PRP laser changes and posterior fill in -- some room for fill in, scattered NV -- regressing attached, 360 MA/DBH, good 360 PRP with room for fill in, scattered NV -- regressing, punctate fibrosis           IMAGING AND PROCEDURES  Imaging and Procedures for 11/23/2023  OCT, Retina - OU - Both Eyes       Right Eye Quality was good. Central Foveal Thickness: 237. Progression has improved. Findings include normal foveal contour, no SRF, intraretinal hyper-reflective material, intraretinal fluid, vitreomacular adhesion , preretinal fibrosis (Persistent IRF/IRHM IT macula--slightly improved; +PRF overlying disc and inferior macula).   Left Eye Quality was good. Central Foveal Thickness: 253. Progression has been stable. Findings include normal foveal contour, no IRF, no SRF, epiretinal membrane, macular pucker, vitreous traction, preretinal fibrosis (Mild cystic changes inferior macula -- stably improved, pre retinal fibrosis overlying disc extending to macula w/ mild traction -- stable, stable resolution of pre retinal hyper reflective material IT macula).   Notes *Images captured and stored on drive  Diagnosis / Impression:  PDR w/ DME OU OD: Persistent IRF/IRHM IT macula--slightly improved; +PRF overlying disc and inferior macula OS: Mild cystic changes inferior macula -- stably improved, pre retinal fibrosis overlying disc extending to macula w/ mild traction -- stable, stable resolution of pre retinal hyper reflective material IT macula  Clinical management:  See below  Abbreviations: NFP - Normal foveal profile.  CME - cystoid macular edema. PED - pigment epithelial detachment. IRF - intraretinal fluid. SRF - subretinal fluid. EZ - ellipsoid zone. ERM - epiretinal membrane. ORA - outer retinal atrophy. ORT - outer retinal tubulation. SRHM - subretinal hyper-reflective material. IRHM - intraretinal hyper-reflective material      Intravitreal Injection, Pharmacologic Agent - OD - Right Eye       Time Out 11/23/2023. 10:07 AM. Confirmed correct patient, procedure, site, and patient consented.   Anesthesia Topical anesthesia was used. Anesthetic medications included Lidocaine  2%, Proparacaine 0.5%.   Procedure Preparation included 5% betadine to ocular surface, eyelid speculum. A (32g) needle was used.   Injection: 1.25 mg Bevacizumab  1.25mg /0.4ml   Route: Intravitreal, Site: Right Eye   NDC: H525437, Lot: [id number], Expiration date: 01/31/2024   Post-op Post injection exam found visual acuity of at least counting fingers. The patient tolerated the procedure well. There were no complications. The patient received written and verbal post procedure care education.      Intravitreal Injection, Pharmacologic Agent - OS - Left Eye       Time Out 11/23/2023. 10:07 AM. Confirmed correct patient, procedure, site, and patient consented.   Anesthesia Topical anesthesia was used. Anesthetic medications included Lidocaine  2%, Proparacaine 0.5%.   Procedure Preparation included 5% betadine to ocular surface, eyelid speculum. A (32g) needle was used.   Injection: 1.25 mg Bevacizumab  1.25mg /0.37ml   Route: Intravitreal, Site: Left Eye   NDC: H525437, Lot: [id number], Expiration date: 02/07/2024   Post-op Post injection exam found visual acuity of at least counting fingers. The patient tolerated the procedure well. There were no complications. The patient received written and verbal post procedure care education. Post injection medications were not given.            ASSESSMENT/PLAN:     ICD-10-CM   1. 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 Current use of insulin  (HCC)  Z79.4     3. Epiretinal membrane (ERM) of both eyes  H35.373     4. Essential hypertension  I10     5. Hypertensive retinopathy of both eyes  H35.033     6. High-risk pregnancy in second trimester  O09.92      ** 12.31.24 -- Patient blood sugar was checked in the office (72) and she was given orange juice.**  1-3. 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, both eyes - last A1c 10.7 (10.30.24), 13.9 (08.30.24), 10.9 (02.16.24), 14.5 (09/01/22), 7.4 (06.06.23)  - delayed to follow up from 4 weeks to 9 weeks (02.05.24 to 04.09.24)  - s/p PRP OD (03.27.23), fill in (05.15.23)  - s/p PRP OS (04.17.23), fill in (10.20.23) - s/p IVA OS #1 (12.08.23), #2 (01.08.24), #3 (04.09.24), #4 (05.21.24), #5 (07.01.24),  #6 (8.12.24), #7 (09.27.24), #8 (11.19.24) - s/p IVA OD #1 (02.05.24), #2 (04.09.24), #3 (05.21.24), #4 (07.01.24), #5 (08.12.24), #6 (09.27.24), #7 (11.19.24) - BCVA OD 20/25 from 20/20, OS 20/20 - stable - FA 10.20.23 shows 360 midzonal NVE w/ leakage OU - OCT shows OD: Persistent IRF/IRHM IT macula--slightly improved, +PRF overlying disc and inferior macula; OS: Mild cystic changes inferior macula -- stably improved, pre retinal fibrosis overlying disc extending to macula w/ mild traction -- stable, stable resolution of pre retinal hyper reflective material IT macula at 6 weeks - recommend IVA OU (OD #8 and OS #9) today, 12.31.24 with follow up in 6 weeks - pt wishes to proceed with injection - RBA of procedure discussed, questions answered - IVA informed consent obtained  and signed, 12.31.24 (OU) - see procedure note - approved for Eylea  for 2024 - f/u 6 weeks -- DFE/OCT, possible injections  4-6. History of high risk pregnancy w/ +HTN and hypertensive retinopathy - s/p Cesarean 06.24.23 (baby boy) - 33 weeks and 1 day, 3lbs, now out of the NICU and at home - history of pre-eclampsia - BP improved since delivery - discussed importance of tight BP control  - continue to monitor  Ophthalmic Meds Ordered this visit:  Meds ordered this encounter  Medications   Bevacizumab  (AVASTIN ) SOLN 1.25 mg   Bevacizumab  (AVASTIN ) SOLN 1.25 mg     Return in about 6 weeks (around 01/04/2024) for f/u PDR OU , DFE, OCT, Possible, IVA, OU.  There are no Patient Instructions on file for this visit.  Explained the diagnoses, plan, and follow up with the patient and they expressed understanding.  Patient expressed understanding of the importance of proper follow up care.   This document serves as a record of services personally performed by Redell JUDITHANN Hans, MD, PhD. It was created on their behalf by Wanda GEANNIE Keens, COT an ophthalmic technician. The creation of this record is the provider's dictation and/or activities during the visit.    Electronically signed by:  Wanda GEANNIE Keens, COT  11/24/23 2:06 AM  Redell JUDITHANN Hans, M.D., Ph.D. Diseases & Surgery of the Retina and Vitreous [facility]  I have reviewed the above documentation for accuracy and completeness, and I agree with the above. Redell JUDITHANN Hans, M.D., Ph.D. 11/24/23 2:06 AM   Abbreviations: M myopia (nearsighted); A astigmatism; H hyperopia (farsighted); P presbyopia; Mrx spectacle prescription;  CTL contact lenses; OD right eye; OS  left eye; OU both eyes  XT exotropia; ET esotropia; PEK punctate epithelial keratitis; PEE punctate epithelial erosions; DES dry eye syndrome; MGD meibomian gland dysfunction; ATs artificial tears; PFAT's preservative free artificial tears; NSC nuclear  sclerotic cataract; PSC posterior subcapsular cataract; ERM epi-retinal membrane; PVD posterior vitreous detachment; RD retinal detachment; DM diabetes mellitus; DR diabetic retinopathy; NPDR non-proliferative diabetic retinopathy; PDR proliferative diabetic retinopathy; CSME clinically significant macular edema; DME diabetic macular edema; dbh dot blot hemorrhages; CWS cotton wool spot; POAG primary open angle glaucoma; C/D cup-to-disc ratio; HVF humphrey visual field; GVF goldmann visual field; OCT optical coherence tomography; IOP intraocular pressure; BRVO Branch retinal vein occlusion; CRVO central retinal vein occlusion; CRAO central retinal artery occlusion; BRAO branch retinal artery occlusion; RT retinal tear; SB scleral buckle; PPV pars plana vitrectomy; VH Vitreous hemorrhage; PRP panretinal laser photocoagulation; IVK intravitreal kenalog ; VMT vitreomacular traction; MH Macular hole;  NVD neovascularization of the disc; NVE neovascularization elsewhere; AREDS age related eye disease study; ARMD age related macular degeneration; POAG primary open angle glaucoma; EBMD epithelial/anterior basement membrane dystrophy; ACIOL anterior chamber intraocular lens; IOL intraocular lens; PCIOL posterior chamber intraocular lens; Phaco/IOL phacoemulsification with intraocular lens placement; PRK photorefractive keratectomy; LASIK laser assisted in situ keratomileusis; HTN hypertension; DM diabetes mellitus; COPD chronic obstructive pulmonary disease

## 2023-11-23 ENCOUNTER — Ambulatory Visit (INDEPENDENT_AMBULATORY_CARE_PROVIDER_SITE_OTHER): Payer: Medicaid Other | Admitting: Ophthalmology

## 2023-11-23 ENCOUNTER — Encounter (INDEPENDENT_AMBULATORY_CARE_PROVIDER_SITE_OTHER): Payer: Self-pay | Admitting: Ophthalmology

## 2023-11-23 DIAGNOSIS — H35033 Hypertensive retinopathy, bilateral: Secondary | ICD-10-CM

## 2023-11-23 DIAGNOSIS — O0992 Supervision of high risk pregnancy, unspecified, second trimester: Secondary | ICD-10-CM

## 2023-11-23 DIAGNOSIS — E103513 Type 1 diabetes mellitus with proliferative diabetic retinopathy with macular edema, bilateral: Secondary | ICD-10-CM

## 2023-11-23 DIAGNOSIS — I10 Essential (primary) hypertension: Secondary | ICD-10-CM | POA: Diagnosis not present

## 2023-11-23 DIAGNOSIS — H35373 Puckering of macula, bilateral: Secondary | ICD-10-CM | POA: Diagnosis not present

## 2023-11-23 DIAGNOSIS — Z794 Long term (current) use of insulin: Secondary | ICD-10-CM

## 2023-11-23 MED ORDER — BEVACIZUMAB CHEMO INJECTION 1.25MG/0.05ML SYRINGE FOR KALEIDOSCOPE
1.2500 mg | INTRAVITREAL | Status: AC | PRN
  Administered 2023-11-23: 1.25 mg via INTRAVITREAL

## 2023-12-03 ENCOUNTER — Ambulatory Visit: Payer: Medicaid Other | Admitting: Pharmacist

## 2023-12-06 ENCOUNTER — Telehealth: Payer: Self-pay | Admitting: Internal Medicine

## 2023-12-06 ENCOUNTER — Encounter: Payer: Self-pay | Admitting: Pharmacist

## 2023-12-06 ENCOUNTER — Other Ambulatory Visit: Payer: Self-pay

## 2023-12-06 ENCOUNTER — Telehealth: Payer: Self-pay | Admitting: Nutrition

## 2023-12-06 ENCOUNTER — Ambulatory Visit: Payer: Medicaid Other | Attending: Family Medicine | Admitting: Pharmacist

## 2023-12-06 DIAGNOSIS — E1042 Type 1 diabetes mellitus with diabetic polyneuropathy: Secondary | ICD-10-CM

## 2023-12-06 DIAGNOSIS — Z794 Long term (current) use of insulin: Secondary | ICD-10-CM | POA: Diagnosis not present

## 2023-12-06 NOTE — Telephone Encounter (Signed)
 Spoke with patient and advise that prescription is on file with the pharmacy

## 2023-12-06 NOTE — Telephone Encounter (Signed)
 MEDICATION: Insulin  Disposable Pump -OMNIPOD 5 G7   PHARMACY:    [id number] - [place], [place] - [address] (Ph: [phone])    HAS THE PATIENT CONTACTED THEIR PHARMACY?  Yes  IS THIS A 90 DAY SUPPLY : Yes  IS PATIENT OUT OF MEDICATION: Yes  IF NOT; HOW MUCH IS LEFT:   LAST APPOINTMENT DATE: @11 /22/2024  NEXT APPOINTMENT DATE:@2 /25/2025  DO WE HAVE YOUR PERMISSION TO LEAVE A DETAILED MESSAGE?: Yes  OTHER COMMENTS:    **Let patient know to contact pharmacy at the end of the day to make sure medication is ready. **  ** Please notify patient to allow 48-72 hours to process**  **Encourage patient to contact the pharmacy for refills or they can request refills through [facility]**

## 2023-12-06 NOTE — Telephone Encounter (Signed)
 Hi Rock - I am working with Herlene at [facility] today and we saw this patient for DM follow-up. I saw you had not been able to contact her successfully after setting up her OmniPod. We updated her phone number [phone]) in Epic today. She ran out of pods last week and was [facility] and then was planning to come back by the endocrinology office this afternoon for assistance and to set up follow-up. Just wanted to provide an update - Thank you!

## 2023-12-06 NOTE — Progress Notes (Signed)
 Kristin Clay #[id number] S:     Chief Complaint  Patient presents with   Diabetes   38 y.o. female who presents for diabetes evaluation, education, and management. Patient was last seen by Primary Care Provider, Dr. Tanda, on 09/22/2023. A1c at that visit was 10.7%. 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   Patient was last seen at Pharmacy clinic on 10/29/23. At that time patient had obtained Dexcom G7 sensors and Omnipod supplies and had an upcoming appt with the DM educator at Endocrine on 11/03/23. Therefore her regimen was not adjusted. Pt did have Omnipod applied and set up with CDE on 11/03/23, but was since lost-to-follow-up due to having a new phone number.   Today, patient arrives in good spirits and presents without  any assistance. She has been out of pods since last week, and has not had a sensor in the past 3 days. She does report that she had skin irritation/itching at the site of the Omnipod. Also reports that her Dexcom sensors frequently fall off early, despite using over grip tape. Since she has been out of Omnipods she has only been taking Novolog  12 units daily (Flexpen), as she was told not to take Lantus  after starting her pump.   Family/Social History:  - Fhx: HTN, HLD, Kidney disease  - Never smoker   Current diabetes medications include: Novolog  12 units TID AC + CF (130/50) since running out of Omnipods ~1 week ago.  Patient reports adherence to taking all medications as prescribed.   Insurance coverage: [place] [employer]   Patient endorses ~2 hypoglycemic events since last pharmacy visit.   Patient denies nocturia (nighttime urination).  Patient reports neuropathy (nerve pain). Patient denies visual changes.  Patient reported dietary habits: Eats 4 meals/day Patient-reported exercise habits: not discussed this visit   O:  Brings CGM (Dexcom G7) Clarity App for review.  CGM active ~30% - limited data in Jan 2025: 14 day  average: 246 TIR: 27% TAR: 71% TBR: 1%  Lab Results  Component Value Date   HGBA1C 10.7 (A) 09/22/2023   There were no vitals filed for this visit.  Lipid Panel     Component Value Date/Time   CHOL 179 10/29/2019 0243   TRIG 77 10/29/2019 0243   HDL 35 (L) 10/29/2019 0243   CHOLHDL 5.1 10/29/2019 0243   VLDL 15 10/29/2019 0243   LDLCALC 129 (H) 10/29/2019 0243   Clinical Atherosclerotic Cardiovascular Disease (ASCVD): No  The ASCVD Risk score (Arnett DK, et al., 2019) failed to calculate for the following reasons:   The 2019 ASCVD risk score is only valid for ages 55 to 23   Patient is participating in a Managed Medicaid Plan:  Yes   A/P: Diabetes longstanding  currently uncontrolled with A1c of 10.7% above goal < 7%. Patient did have Omnipod for ~1 mo but was unaware that she could request refills from the pharmacy, so has now been without adequate insulin  for over 1 week (prandial only, no basal). She is also due for a refill of her CGM. Will coordinate with endocrinology to get patient continued follow-up and assistance managing her new insulin  pump. Patient is not having symptomatic hyper or hypoglycemia at this time. - Patient encouraged to pick up Dexcom G7 sensors from [facility] pharmacy after appointment. Her husband will be picking up the Omnipod refill from [facility]. She was instructed to bring the supplies over to the endocrinology office this afternoon for assistance and to set up continued follow-up.  -  Communicated with CDE Rock Dasen at Endocrine office to provide update and coordinate care.  -No medications were adjusted today  -Patient educated on purpose, proper use, and potential adverse effects of insulin .  -Counseled on s/sx of and management of hypoglycemia.  -Next A1c anticipated January 2025.   Patient verbalized understanding of treatment plan.  Total time in face to face counseling 35 minutes.    Follow-up:  Pharmacist prn. Endocrine/CDE asap for continued  Omnipod support PCP 12/24/23 Endocrine 01/18/24  Lorain Baseman, PharmD PGY1 Pharmacy Resident  Herlene Fleeta Morris, PharmD, BCACP, CPP Clinical Pharmacist [facility] [phone]

## 2023-12-07 ENCOUNTER — Other Ambulatory Visit: Payer: Self-pay | Admitting: Internal Medicine

## 2023-12-23 NOTE — Progress Notes (Signed)
[facility] - Clinic Note  01/04/2024     CHIEF COMPLAINT Patient presents for Retina Follow Up   HISTORY OF PRESENT ILLNESS: Kristin Clay is a 38 y.o. female who presents to the clinic today for:   HPI     Retina Follow Up   Patient presents with  Diabetic Retinopathy.  In both eyes.  This started 6 weeks ago.  Duration of 6 weeks.  Since onset it is stable.  I, the attending physician,  performed the HPI with the patient and updated documentation appropriately.        Comments   6 week retina follow up PDR OU and IVA OU pt is reporting no vision changes noticed she denies any flashes or floaters her last reading was 250      Last edited by Rennis Chris, MD on 01/04/2024 12:06 PM.    Patient states her vision is stable, she has an insulin pump, she states her blood sugar is usually around 140, the highest it's been in 375 after a large meal  Referring physician: Aura Camps MD [address] [place], [place] [address]  HISTORICAL INFORMATION:   Selected notes from the MEDICAL RECORD NUMBER Referred by Dr. Karleen Hampshire for diabetic retinal evaluation LEE:  Ocular Hx- PMH-pregnancy, DM    CURRENT MEDICATIONS: No current outpatient medications on file. (Ophthalmic Drugs)   No current facility-administered medications for this visit. (Ophthalmic Drugs)   Current Outpatient Medications (Other)  Medication Sig   aspirin 81 MG chewable tablet Chew by mouth.   Continuous Glucose Receiver (DEXCOM G7 RECEIVER) DEVI Use to check blood sugar continuously throughout the day.   Continuous Glucose Sensor (DEXCOM G7 SENSOR) MISC Use to check blood sugar continuously throughout the day. Change sensors once every 10 days.   fluconazole (DIFLUCAN) 150 MG tablet Take 150 mg by mouth once.   insulin aspart (NOVOLOG FLEXPEN) 100 UNIT/ML FlexPen Inject 12 Units into the skin 3 (three) times daily with meals.   insulin aspart (NOVOLOG) 100  UNIT/ML injection Inject 70 units max daily via insulin pump   Insulin Disposable Pump (OMNIPOD 5 DEXG7G6 INTRO GEN 5) KIT 1 DEVICE BY DOES NOT APPLY ROUTE EVERY OTHER DAY.   Insulin Disposable Pump (OMNIPOD 5 G7 PODS, GEN 5,) MISC 1 Device by Does not apply route every other day.   insulin glargine (LANTUS SOLOSTAR) 100 UNIT/ML Solostar Pen Inject 34 Units into the skin daily. (Patient not taking: Reported on 12/06/2023)   Insulin Pen Needle 31G X 5 MM MISC use at bedtime.   labetalol (NORMODYNE) 300 MG tablet Take 1 tablet (300 mg total) by mouth 2 (two) times daily.   methylPREDNISolone (MEDROL DOSEPAK) 4 MG TBPK tablet Take po daily as directed   NIFEdipine (ADALAT CC) 60 MG 24 hr tablet TAKE 1 TABLET BY MOUTH 2 TIMES DAILY.   Prenatal Vit-Fe Fumarate-FA (PREPLUS) 27-1 MG TABS Take 1 tablet by mouth daily.   No current facility-administered medications for this visit. (Other)   REVIEW OF SYSTEMS: ROS   Positive for: Endocrine, Eyes Negative for: Constitutional, Gastrointestinal, Neurological, Skin, Genitourinary, Musculoskeletal, HENT, Cardiovascular, Respiratory, Psychiatric, Allergic/Imm, Heme/Lymph Last edited by Etheleen Mayhew, COT on 01/04/2024  8:52 AM.        ALLERGIES Allergies  Allergen Reactions   Pork-Derived Products    PAST MEDICAL HISTORY Past Medical History:  Diagnosis Date   Chronic hypertension    Complication of anesthesia    with hand  surgery, local anes did not work, tried twice- had to put her to sleep   Diabetic retinopathy (HCC)    DKA (diabetic ketoacidosis) (HCC) 10/2020   Epidural hematoma (HCC) 07/04/2021   Hypertension in pregnancy, preeclampsia, severe, delivered 12/15/2021   Obstetric vaginal laceration with type 3c third degree perineal laceration 06/29/2021   Pyelonephritis 10/2020   Rh negative status during pregnancy 05/01/2021   s/p Rhogam 05/13/21   Type I diabetes mellitus (HCC)    dx 72yrs ago   UTI (urinary tract infection)     Past Surgical History:  Procedure Laterality Date   CESAREAN SECTION  05/16/2022   Procedure: CESAREAN SECTION;  Surgeon: Warden Fillers, MD;  Location: [facility] LD ORS;  Service: Obstetrics;;   female circumcision Bilateral    clitorectomy as well   FOOT SURGERY  2018   HAND SURGERY  2019   FAMILY HISTORY Family History  Problem Relation Age of Onset   Hyperlipidemia Mother    Hypertension Mother    Kidney disease Father    Alzheimer's disease Father    SOCIAL HISTORY Social History   Tobacco Use   Smoking status: Never   Smokeless tobacco: Never  Vaping Use   Vaping status: Never Used  Substance Use Topics   Alcohol use: Never   Drug use: Never       OPHTHALMIC EXAM:  Base Eye Exam     Visual Acuity (Snellen - Linear)       Right Left   Dist [place] 20/25 -2 20/25   Dist ph [place] NI NI         Tonometry (Tonopen, 8:55 AM)       Right Left   Pressure 11 16         Pupils       Pupils Dark Light Shape React APD   Right PERRL 3 2 Round Brisk None   Left PERRL 3 2 Round Brisk None         Visual Fields       Left Right    Full Full         Extraocular Movement       Right Left    Full, Ortho Full, Ortho         Neuro/Psych     Oriented x3: Yes   Mood/Affect: Normal         Dilation     Both eyes: 2.5% Phenylephrine @ 8:55 AM           Slit Lamp and Fundus Exam     Slit Lamp Exam       Right Left   Lids/Lashes normal normal   Conjunctiva/Sclera mild melanosis, Trace Injection, +corkscrew vessels nasally mild melanosis   Cornea trace PEE Clear   Anterior Chamber deep, clear, narrow temporal angle deep and quiet   Iris round and dilated, no NVI round and dilated, no NVI   Lens 1+Cortical changes Clear   Anterior Vitreous syneresis syneresis         Fundus Exam       Right Left   Disc pink and sharp; compact; +fibrosis, fine NVD -- regressing; +PPP pink and sharp; compact; +fibrosis w/ fine NVD -- regressing   C/D  Ratio 0.2 0.2   Macula good foveal reflex, scattered MA/DBH, punctate exudates and cystic changes temporal macula -- slightly improved, fine NVE inferior and temporal macula - regressing, ERM with striae nasally good foveal reflex, ERM with striae, scattered MA/DBH greatest temporal  macula -- improving, exudates temporally, scattered fibrosis and NV   Vessels attenuated, Tortuous, early NVE greatest inferior arcades -- ?regressing, Copper wiring attenuated, +NVE -- scattered, +fibrosis, Tortuous   Periphery attached, 360 MA/DBH, good 360 PRP laser changes and posterior fill in -- some room for fill in, scattered NV -- regressing attached, 360 MA/DBH, good 360 PRP with room for fill in, scattered NV -- regressing, punctate fibrosis           IMAGING AND PROCEDURES  Imaging and Procedures for 01/04/2024  OCT, Retina - OU - Both Eyes       Right Eye Quality was good. Central Foveal Thickness: 234. Progression has improved. Findings include normal foveal contour, no SRF, intraretinal hyper-reflective material, intraretinal fluid, vitreomacular adhesion , preretinal fibrosis (Persistent IRF/IRHM IT macula -- slightly improved; +PRF overlying disc and inferior macula).   Left Eye Quality was good. Central Foveal Thickness: 248. Progression has been stable. Findings include normal foveal contour, no IRF, no SRF, epiretinal membrane, macular pucker, vitreous traction, preretinal fibrosis (Trace cystic changes temporal macula -- slightly improved, pre retinal fibrosis overlying disc extending to macula w/ mild traction -- stable).   Notes *Images captured and stored on drive  Diagnosis / Impression:  PDR w/ DME OU OD: Persistent IRF/IRHM IT macula--slightly improved; +PRF overlying disc and inferior macula OS: Mild cystic changes inferior macula -- stably improved, pre retinal fibrosis overlying disc extending to macula w/ mild traction -- stable  Clinical management:  See  below  Abbreviations: NFP - Normal foveal profile. CME - cystoid macular edema. PED - pigment epithelial detachment. IRF - intraretinal fluid. SRF - subretinal fluid. EZ - ellipsoid zone. ERM - epiretinal membrane. ORA - outer retinal atrophy. ORT - outer retinal tubulation. SRHM - subretinal hyper-reflective material. IRHM - intraretinal hyper-reflective material      Intravitreal Injection, Pharmacologic Agent - OD - Right Eye       Time Out 01/04/2024. 10:07 AM. Confirmed correct patient, procedure, site, and patient consented.   Anesthesia Topical anesthesia was used. Anesthetic medications included Lidocaine 2%, Proparacaine 0.5%.   Procedure Preparation included 5% betadine to ocular surface, eyelid speculum. A supplied (32g) needle was used.   Injection: 1.25 mg Bevacizumab 1.25mg /0.49ml   Route: Intravitreal, Site: Right Eye   NDC: P3213405, Lot: [id number], Expiration date: 02/08/2024   Post-op Post injection exam found visual acuity of at least counting fingers. The patient tolerated the procedure well. There were no complications. The patient received written and verbal post procedure care education.      Intravitreal Injection, Pharmacologic Agent - OS - Left Eye       Time Out 01/04/2024. 10:07 AM. Confirmed correct patient, procedure, site, and patient consented.   Anesthesia Topical anesthesia was used. Anesthetic medications included Lidocaine 2%, Proparacaine 0.5%.   Procedure Preparation included 5% betadine to ocular surface, eyelid speculum. A (32g) needle was used.   Injection: 1.25 mg Bevacizumab 1.25mg /0.36ml   Route: Intravitreal, Site: Left Eye   NDC: P3213405, Lot: [id number], Expiration date: 02/10/2024   Post-op Post injection exam found visual acuity of at least counting fingers. The patient tolerated the procedure well. There were no complications. The patient received written and verbal post procedure care education. Post injection  medications were not given.            ASSESSMENT/PLAN:    ICD-10-CM   1. Proliferative diabetic retinopathy of both eyes with macular edema associated with type 1 diabetes mellitus (HCC)  E10.3513 OCT, Retina -  OU - Both Eyes    Intravitreal Injection, Pharmacologic Agent - OD - Right Eye    Intravitreal Injection, Pharmacologic Agent - OS - Left Eye    Bevacizumab (AVASTIN) SOLN 1.25 mg    Bevacizumab (AVASTIN) SOLN 1.25 mg    2. Current use of insulin (HCC)  Z79.4     3. Epiretinal membrane (ERM) of both eyes  H35.373     4. Essential hypertension  I10     5. Hypertensive retinopathy of both eyes  H35.033     6. High-risk pregnancy in second trimester  O09.92      1-3. 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, both eyes - last A1c 10.7 (10.30.24), 13.9 (08.30.24), 10.9 (02.16.24), 14.5 (09/01/22), 7.4 (06.06.23) - delayed to follow up from 4 weeks to 9 weeks (02.05.24 to 04.09.24)  - s/p PRP OD (03.27.23), fill in (05.15.23)  - s/p PRP OS (04.17.23), fill in (10.20.23) - s/p IVA OS #1 (12.08.23), #2 (01.08.24), #3 (04.09.24), #4 (05.21.24), #5 (07.01.24),  #6 (8.12.24), #7 (09.27.24), #8 (11.19.24), #9 (12.31.24) - s/p IVA OD #1 (02.05.24), #2 (04.09.24), #3 (05.21.24), #4 (07.01.24), #5 (08.12.24), #6 (09.27.24), #7 (11.19.24), #8 (12.31.24) - BCVA OD 20/25 from 20/20, OS 20/20 - stable - FA 10.20.23 shows 360 midzonal NVE w/ leakage OU - OCT shows OD: Persistent IRF/IRHM IT macula--slightly improved; +PRF overlying disc and inferior macula; OS: Mild cystic changes inferior macula -- stably improved, pre retinal fibrosis overlying disc extending to macula w/ mild traction -- stable at 6 weeks - recommend IVA OU (OD #9 and OS #10) today, 02.11.25 with follow up extended to 6-7 weeks - pt wishes to proceed with injections - RBA of procedure discussed, questions answered - IVA informed consent obtained and signed, 12.31.24 (OU) - see procedure note - approved for  Eylea for 2024 - f/u 6 weeks -- DFE/OCT, possible injections  4-6. History of high risk pregnancy w/ +HTN and hypertensive retinopathy - s/p Cesarean 06.24.23 (baby boy) - 33 weeks and 1 day, 3lbs, now out of the NICU and at home - history of pre-eclampsia - BP improved since delivery - discussed importance of tight BP control  - continue to monitor  Ophthalmic Meds Ordered this visit:  Meds ordered this encounter  Medications   Bevacizumab (AVASTIN) SOLN 1.25 mg   Bevacizumab (AVASTIN) SOLN 1.25 mg     Return for f/u 6-7 weeks, PDR OU, DFE, OCT, Possible Injxn.  There are no Patient Instructions on file for this visit.  Explained the diagnoses, plan, and follow up with the patient and they expressed understanding.  Patient expressed understanding of the importance of proper follow up care.   This document serves as a record of services personally performed by Karie Chimera, MD, PhD. It was created on their behalf by Charlette Caffey, COT an ophthalmic technician. The creation of this record is the provider's dictation and/or activities during the visit.    Electronically signed by:  Charlette Caffey, COT  01/04/24 12:15 PM  This document serves as a record of services personally performed by Karie Chimera, MD, PhD. It was created on their behalf by Glee Arvin. Manson Passey, OA an ophthalmic technician. The creation of this record is the provider's dictation and/or activities during the visit.    Electronically signed by: Glee Arvin. Manson Passey, OA 01/04/24 12:15 PM  Karie Chimera, M.D., Ph.D. Diseases & Surgery of the Retina and Vitreous [facility]  I have  reviewed the above documentation for accuracy and completeness, and I agree with the above. Karie Chimera, M.D., Ph.D. 01/04/24 12:18 PM   Abbreviations: M myopia (nearsighted); A astigmatism; H hyperopia (farsighted); P presbyopia; Mrx spectacle prescription;  CTL contact lenses; OD right eye; OS left  eye; OU both eyes  XT exotropia; ET esotropia; PEK punctate epithelial keratitis; PEE punctate epithelial erosions; DES dry eye syndrome; MGD meibomian gland dysfunction; ATs artificial tears; PFAT's preservative free artificial tears; NSC nuclear sclerotic cataract; PSC posterior subcapsular cataract; ERM epi-retinal membrane; PVD posterior vitreous detachment; RD retinal detachment; DM diabetes mellitus; DR diabetic retinopathy; NPDR non-proliferative diabetic retinopathy; PDR proliferative diabetic retinopathy; CSME clinically significant macular edema; DME diabetic macular edema; dbh dot blot hemorrhages; CWS cotton wool spot; POAG primary open angle glaucoma; C/D cup-to-disc ratio; HVF humphrey visual field; GVF goldmann visual field; OCT optical coherence tomography; IOP intraocular pressure; BRVO Branch retinal vein occlusion; CRVO central retinal vein occlusion; CRAO central retinal artery occlusion; BRAO branch retinal artery occlusion; RT retinal tear; SB scleral buckle; PPV pars plana vitrectomy; VH Vitreous hemorrhage; PRP panretinal laser photocoagulation; IVK intravitreal kenalog; VMT vitreomacular traction; MH Macular hole;  NVD neovascularization of the disc; NVE neovascularization elsewhere; AREDS age related eye disease study; ARMD age related macular degeneration; POAG primary open angle glaucoma; EBMD epithelial/anterior basement membrane dystrophy; ACIOL anterior chamber intraocular lens; IOL intraocular lens; PCIOL posterior chamber intraocular lens; Phaco/IOL phacoemulsification with intraocular lens placement; PRK photorefractive keratectomy; LASIK laser assisted in situ keratomileusis; HTN hypertension; DM diabetes mellitus; COPD chronic obstructive pulmonary disease

## 2023-12-24 ENCOUNTER — Ambulatory Visit: Payer: Medicaid Other | Admitting: Family Medicine

## 2023-12-24 ENCOUNTER — Encounter: Payer: Self-pay | Admitting: Family Medicine

## 2023-12-24 VITALS — BP 101/68 | HR 88 | Temp 98.3°F | Resp 16 | Ht 62.0 in | Wt 128.8 lb

## 2023-12-24 DIAGNOSIS — Z789 Other specified health status: Secondary | ICD-10-CM

## 2023-12-24 DIAGNOSIS — Z5986 Financial insecurity: Secondary | ICD-10-CM

## 2023-12-24 DIAGNOSIS — Z794 Long term (current) use of insulin: Secondary | ICD-10-CM | POA: Diagnosis not present

## 2023-12-24 DIAGNOSIS — M25512 Pain in left shoulder: Secondary | ICD-10-CM

## 2023-12-24 DIAGNOSIS — I10 Essential (primary) hypertension: Secondary | ICD-10-CM | POA: Diagnosis not present

## 2023-12-24 DIAGNOSIS — E11649 Type 2 diabetes mellitus with hypoglycemia without coma: Secondary | ICD-10-CM

## 2023-12-24 LAB — POCT GLYCOSYLATED HEMOGLOBIN (HGB A1C): Hemoglobin A1C: 10.1 % — AB (ref 4.0–5.6)

## 2023-12-28 ENCOUNTER — Encounter: Payer: Self-pay | Admitting: Family Medicine

## 2023-12-28 NOTE — Progress Notes (Signed)
Established Patient Office Visit  Subjective    Patient ID: Kristin Clay, female    DOB: [birth year]  Age: 38 y.o. MRN: [id number]  CC:  Chief Complaint  Patient presents with   Follow-up    3 month, pain in shoulders    HPI Kristin Clay presents for follow up of diabetes. She has had complications related to her diabetes but today denies acute complaints. She does want to start to utilize pump. This visit was aided by an interpreter.   Outpatient Encounter Medications as of 12/24/2023  Medication Sig   aspirin 81 MG chewable tablet Chew by mouth.   Continuous Glucose Receiver (DEXCOM G7 RECEIVER) DEVI Use to check blood sugar continuously throughout the day.   Continuous Glucose Sensor (DEXCOM G7 SENSOR) MISC Use to check blood sugar continuously throughout the day. Change sensors once every 10 days.   fluconazole (DIFLUCAN) 150 MG tablet Take 150 mg by mouth once.   insulin aspart (NOVOLOG FLEXPEN) 100 UNIT/ML FlexPen Inject 12 Units into the skin 3 (three) times daily with meals.   Insulin Disposable Pump (OMNIPOD 5 DEXG7G6 INTRO GEN 5) KIT 1 DEVICE BY DOES NOT APPLY ROUTE EVERY OTHER DAY.   Insulin Disposable Pump (OMNIPOD 5 G7 PODS, GEN 5,) MISC 1 Device by Does not apply route every other day.   Insulin Pen Needle 31G X 5 MM MISC 1 each by Does not apply route at bedtime.   labetalol (NORMODYNE) 300 MG tablet Take 1 tablet (300 mg total) by mouth 2 (two) times daily.   methylPREDNISolone (MEDROL DOSEPAK) 4 MG TBPK tablet Take po daily as directed   NIFEdipine (ADALAT CC) 60 MG 24 hr tablet TAKE 1 TABLET BY MOUTH 2 TIMES DAILY.   Prenatal Vit-Fe Fumarate-FA (PREPLUS) 27-1 MG TABS Take 1 tablet by mouth daily.   insulin aspart (NOVOLOG) 100 UNIT/ML injection Inject 70 units max daily via insulin pump (Patient not taking: Reported on 12/24/2023)   insulin glargine (LANTUS SOLOSTAR) 100 UNIT/ML Solostar Pen Inject 34 Units into the skin daily. (Patient not  taking: Reported on 12/06/2023)   [DISCONTINUED] insulin detemir (LEVEMIR) 100 UNIT/ML injection Inject 20-40 Units into the skin See admin instructions. 40 units every morning and 20 units every night (Patient not taking: Reported on 04/11/2021)   No facility-administered encounter medications on file as of 12/24/2023.    Past Medical History:  Diagnosis Date   Chronic hypertension    Complication of anesthesia    with hand surgery, local anes did not work, tried twice- had to put her to sleep   Diabetic retinopathy (HCC)    DKA (diabetic ketoacidosis) (HCC) 10/2020   Epidural hematoma (HCC) 07/04/2021   Hypertension in pregnancy, preeclampsia, severe, delivered 12/15/2021   Obstetric vaginal laceration with type 3c third degree perineal laceration 06/29/2021   Pyelonephritis 10/2020   Rh negative status during pregnancy 05/01/2021   s/p Rhogam 05/13/21   Type I diabetes mellitus (HCC)    dx 65yrs ago   UTI (urinary tract infection)     Past Surgical History:  Procedure Laterality Date   CESAREAN SECTION  05/16/2022   Procedure: CESAREAN SECTION;  Surgeon: Warden Fillers, MD;  Location: [facility];  Service: Obstetrics;;   female circumcision Bilateral    clitorectomy as well   FOOT SURGERY  2018   HAND SURGERY  2019    Family History  Problem Relation Age of Onset   Hyperlipidemia Mother    Hypertension Mother  Kidney disease Father    Alzheimer's disease Father     Social History   Socioeconomic History   Marital status: Married    Spouse name: Not on file   Number of children: Not on file   Years of education: Not on file   Highest education level: Not on file  Occupational History   Not on file  Tobacco Use   Smoking status: Never   Smokeless tobacco: Never  Vaping Use   Vaping status: Never Used  Substance and Sexual Activity   Alcohol use: Never   Drug use: Never   Sexual activity: Not Currently    Birth control/protection: None  Other Topics Concern    Not on file  Social History Narrative   ** Merged History Encounter **       ** Merged History Encounter **       Social Drivers of Corporate investment banker Strain: High Risk (05/30/2021)   Overall Financial Resource Strain (CARDIA)    Difficulty of Paying Living Expenses: Very hard  Food Insecurity: No Food Insecurity (12/24/2023)   Hunger Vital Sign    Worried About Running Out of Food in the Last Year: Never true    Ran Out of Food in the Last Year: Never true  Transportation Needs: No Transportation Needs (12/24/2023)   PRAPARE - Administrator, Civil Service (Medical): No    Lack of Transportation (Non-Medical): No  Physical Activity: Sufficiently Active (09/22/2023)   Exercise Vital Sign    Days of Exercise per Week: 5 days    Minutes of Exercise per Session: 30 min  Stress: No Stress Concern Present (09/22/2023)   [employer] of Occupational Health - Occupational Stress Questionnaire    Feeling of Stress : Not at all  Social Connections: Moderately Integrated (09/22/2023)   Social Connection and Isolation Panel [NHANES]    Frequency of Communication with Friends and Family: More than three times a week    Frequency of Social Gatherings with Friends and Family: More than three times a week    Attends Religious Services: 1 to 4 times per year    Active Member of [employer] or Organizations: No    Attends Banker Meetings: Never    Marital Status: Married  Catering manager Violence: Not At Risk (09/22/2023)   Humiliation, Afraid, Rape, and Kick questionnaire    Fear of Current or Ex-Partner: No    Emotionally Abused: No    Physically Abused: No    Sexually Abused: No    Review of Systems  All other systems reviewed and are negative.       Objective    BP 101/68 (BP Location: Left Arm, Patient Position: Sitting, Cuff Size: Normal)   Pulse 88   Temp 98.3 F (36.8 C) (Oral)   Resp 16   Ht 5\' 2"  (1.575 m)   Wt 128 lb 12.8 oz (58.4  kg)   SpO2 98%   BMI 23.56 kg/m   Physical Exam Vitals and nursing note reviewed.  Constitutional:      General: She is not in acute distress. Cardiovascular:     Rate and Rhythm: Normal rate and regular rhythm.  Pulmonary:     Effort: Pulmonary effort is normal.     Breath sounds: Normal breath sounds.  Abdominal:     Palpations: Abdomen is soft.     Tenderness: There is no abdominal tenderness.  Musculoskeletal:     Left shoulder: Tenderness present. No deformity.  Decreased range of motion.  Neurological:     General: No focal deficit present.     Mental Status: She is alert and oriented to person, place, and time.         Assessment & Plan:  1. Uncontrolled type 2 diabetes mellitus with hypoglycemia without coma (HCC) (Primary) Uncontrolled. Will refer to endo for further eval/mgt - HgB A1c - Ambulatory referral to Endocrinology  2. Essential hypertension Appears stable. Continue   3. Left shoulder pain, unspecified chronicity  4. Language barrier to communication      Return in about 3 months (around 03/22/2024) for follow up.   Tommie Raymond, MD

## 2023-12-30 NOTE — Progress Notes (Signed)
   S:     No chief complaint on file.  38 y.o. female who presents for diabetes evaluation, education, and management.   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   Patient was last seen by Primary Care Provider, Dr. Tanda, on 12/24/2023. A1c at that visit was 10.1%. BP was 101/68. Ambulatory referral was sent to endocrinology. She has a planned appt with them later this month.  Patient was last seen at Pharmacy clinic on 12/07/23. Patient did have a rxn for Omnipod and Dexcom at that visit but was unaware that she could request refills from the pharmacy, so was without adequate supplies. Endocrinology was contacted to get patient continued follow-up and assistance managing her new insulin  pump.   Today, patient arrives in good spirits and presents with an in-person interpretor.  Family/Social History:  - Fhx: HTN, HLD, Kidney disease  - Never smoker   Current diabetes medications include:  Using OmniPodDoes with current settings as follows:  Basal rate 1 unit/hr x 24 hours ISF:  50 Target 120 Correct above 170 Of note, she administers Novolog  12u TID before meals.   Patient reports adherence to taking all medications as prescribed.   Insurance coverage: [place] Medicaid   Patient denies hypoglycemic events since last pharmacy visit.   Patient denies nocturia (nighttime urination).  Patient reports neuropathy (nerve pain). Patient reports visual changes. Patient reports checking feet.   Patient reported dietary habits: Eats 1 meals/day -salad w/ egg or chicken -toast or rice -Drink: water , coffee/tea w/o sugar -avoids sweets -Snacks: peanuts (rarely)  Patient-reported exercise habits:  -no formal exercise - reports low energy to do excercise  O:   14 day average blood glucose: 255  Average Glucose: 267 mg/dL Glucose Management Indicator: 9.7  Time in Goal:  - Time in range 70-180: 20% - Time above range: 79% - Time below  range: 1%  Lab Results  Component Value Date   HGBA1C 10.1 (A) 12/24/2023   There were no vitals filed for this visit.  Lipid Panel     Component Value Date/Time   CHOL 179 10/29/2019 0243   TRIG 77 10/29/2019 0243   HDL 35 (L) 10/29/2019 0243   CHOLHDL 5.1 10/29/2019 0243   VLDL 15 10/29/2019 0243   LDLCALC 129 (H) 10/29/2019 0243   Clinical Atherosclerotic Cardiovascular Disease (ASCVD): No  The ASCVD Risk score (Arnett DK, et al., 2019) failed to calculate for the following reasons:   The 2019 ASCVD risk score is only valid for ages 37 to 48   Patient is participating in a Managed Medicaid Plan:  Yes   A/P: Diabetes longstanding  currently uncontrolled with A1c of 10.1% above goal < 7%.  -No medications were adjusted today. We have directed her to her Endocrinologist's office to program  -Patient educated on purpose, proper use, and potential adverse effects of insulin .  -Educated patient on proper CGM pairing. -Counseled on s/sx of and management of hypoglycemia.  -Refill sent for insulin  vials for OmniPod filling.  -Refills sent for pen needles to use with her back-up insulin  pens.  -Next A1c 5/25  Patient verbalized understanding of treatment plan.  Total time in face to face counseling 35 minutes.    Follow-up:  Pharmacist: 02/11/2024 Eye: 01/04/24 Endocrine 01/18/24  Seen By: Margretta Fear, PharmD Candidate [facility] [employer] [employer] Class of 2027  Herlene Fleeta Morris, PharmD, [place], CPP Clinical Pharmacist [facility] [phone]

## 2023-12-31 ENCOUNTER — Ambulatory Visit: Payer: Medicaid Other | Attending: Family Medicine | Admitting: Pharmacist

## 2023-12-31 ENCOUNTER — Ambulatory Visit: Payer: Medicaid Other | Admitting: Dietician

## 2023-12-31 ENCOUNTER — Encounter: Payer: Self-pay | Admitting: Pharmacist

## 2023-12-31 ENCOUNTER — Other Ambulatory Visit: Payer: Self-pay

## 2023-12-31 DIAGNOSIS — E119 Type 2 diabetes mellitus without complications: Secondary | ICD-10-CM | POA: Diagnosis present

## 2023-12-31 DIAGNOSIS — Z794 Long term (current) use of insulin: Secondary | ICD-10-CM | POA: Diagnosis not present

## 2023-12-31 DIAGNOSIS — Z9641 Presence of insulin pump (external) (internal): Secondary | ICD-10-CM | POA: Insufficient documentation

## 2023-12-31 DIAGNOSIS — E103593 Type 1 diabetes mellitus with proliferative diabetic retinopathy without macular edema, bilateral: Secondary | ICD-10-CM | POA: Diagnosis not present

## 2023-12-31 DIAGNOSIS — E10649 Type 1 diabetes mellitus with hypoglycemia without coma: Secondary | ICD-10-CM

## 2023-12-31 DIAGNOSIS — E1042 Type 1 diabetes mellitus with diabetic polyneuropathy: Secondary | ICD-10-CM

## 2023-12-31 MED ORDER — INSULIN ASPART 100 UNIT/ML IJ SOLN
70.0000 [IU] | Freq: Every day | INTRAMUSCULAR | 3 refills | Status: DC
  Filled 2023-12-31: qty 20, 28d supply, fill #0
  Filled 2024-02-21: qty 20, 28d supply, fill #1
  Filled 2024-03-20: qty 20, 28d supply, fill #2

## 2023-12-31 MED ORDER — INSULIN PEN NEEDLE 31G X 5 MM MISC
1.0000 | Freq: Every day | 5 refills | Status: DC
  Filled 2023-12-31: qty 100, 90d supply, fill #0

## 2023-12-31 NOTE — Progress Notes (Signed)
 Time spent 1 hour 15 minutes:  Patient is here today as a walk in from [facility] Scientist, Research (physical Sciences) from another clinic in the building) due to increased blood glucose on the Omnipod 5 insulin  pump.   Patient is here today with Sahar an Arabic interpretor with [employer].  Currently her pump is in manual mode but patient states that she uses her pump in auto mode. Unable to obtain her pump report from [facility] as her pump is not connected.  Omnipod 5 current pump settings:   Basal rate 1 unit/hr x 24 hours ISF:  50 Target 120 Correct above 170 ICR:  1:1 - she is not carb counting and enters 12 units at each meal  Dexcom G7  CGM Results from download:   % Time CGM active:   82.9 %   (Goal >70%)  Average glucose:   259 mg/dL for 14 days  Glucose management indicator:   9.5 %  Time in range (70-180 mg/dL):   21 %   (Goal >29%)  Time High (181-250 mg/dL):   30 %   (Goal < 74%)  Time Very High (>250 mg/dL):    49 %   (Goal < 5%)  Time Low (54-69 mg/dL):   0 %   (Goal <5%)  Time Very Low (<54 mg/dL):   <1 %   (Goal <8%)  %CV (glucose variability)    34.9 %  (Goal <36%)   Currently states she is  eating 1 meal per day for the past month but continues to bolus the 12 units tid.  She eats increased amounts of fried foods.  Breakfast:  none today or yesterday Lunch:  none Dinner:  falafel (6) in brown bread (2) with mayo Denies snacks except occasional nuts Water , unsweetened tea  No exercise  Spoke with Donell Redgie Butts, MD.   Changed her ISF to 45 Instructed patient to bolus 14 fifteen minutes before each meal Briefly discussed nutrition and diabetes (recommended less frying, more non-starchy vegetables, lean protein, and discussed carbohydrates. Patient is able to verbalize. Provided My plate in arabic and a carb sheet in arabic.  Patient does have an Omnipod account.  Paired her pump to [facility] with assistance from Omnipod Rep.   Patient's [facility] report should be available to the MD at  patient's next appointment on 01/18/2024.  Omnipod ID and password: Email:  [email or web address] ID:  [email or web address] Password:  [email or web address]  Leita Constable, RD, LDN, CDCES, DipACLM

## 2024-01-04 ENCOUNTER — Encounter (INDEPENDENT_AMBULATORY_CARE_PROVIDER_SITE_OTHER): Payer: Self-pay | Admitting: Ophthalmology

## 2024-01-04 ENCOUNTER — Telehealth: Payer: Self-pay | Admitting: Family Medicine

## 2024-01-04 ENCOUNTER — Ambulatory Visit (INDEPENDENT_AMBULATORY_CARE_PROVIDER_SITE_OTHER): Payer: Medicaid Other | Admitting: Ophthalmology

## 2024-01-04 DIAGNOSIS — Z794 Long term (current) use of insulin: Secondary | ICD-10-CM

## 2024-01-04 DIAGNOSIS — I10 Essential (primary) hypertension: Secondary | ICD-10-CM

## 2024-01-04 DIAGNOSIS — O0992 Supervision of high risk pregnancy, unspecified, second trimester: Secondary | ICD-10-CM

## 2024-01-04 DIAGNOSIS — E103513 Type 1 diabetes mellitus with proliferative diabetic retinopathy with macular edema, bilateral: Secondary | ICD-10-CM | POA: Diagnosis not present

## 2024-01-04 DIAGNOSIS — H35033 Hypertensive retinopathy, bilateral: Secondary | ICD-10-CM

## 2024-01-04 DIAGNOSIS — H35373 Puckering of macula, bilateral: Secondary | ICD-10-CM

## 2024-01-04 MED ORDER — BEVACIZUMAB CHEMO INJECTION 1.25MG/0.05ML SYRINGE FOR KALEIDOSCOPE
1.2500 mg | INTRAVITREAL | Status: AC | PRN
Start: 2024-01-04 — End: 2024-01-04
  Administered 2024-01-04: 1.25 mg via INTRAVITREAL

## 2024-01-04 MED ORDER — BEVACIZUMAB CHEMO INJECTION 1.25MG/0.05ML SYRINGE FOR KALEIDOSCOPE
1.2500 mg | INTRAVITREAL | Status: AC | PRN
  Administered 2024-01-04: 1.25 mg via INTRAVITREAL

## 2024-01-04 NOTE — Telephone Encounter (Signed)
Patient was identified as falling into the [place] Measure - Diabetes.   Patient was seen 12/31/2023 and has a 6 week f/u on 02/11/2024

## 2024-01-18 ENCOUNTER — Other Ambulatory Visit: Payer: Self-pay

## 2024-01-18 ENCOUNTER — Encounter: Payer: Self-pay | Admitting: Internal Medicine

## 2024-01-18 ENCOUNTER — Ambulatory Visit (INDEPENDENT_AMBULATORY_CARE_PROVIDER_SITE_OTHER): Payer: Medicaid Other | Admitting: Internal Medicine

## 2024-01-18 VITALS — BP 104/70 | HR 80 | Ht 62.0 in | Wt 130.0 lb

## 2024-01-18 DIAGNOSIS — E1042 Type 1 diabetes mellitus with diabetic polyneuropathy: Secondary | ICD-10-CM

## 2024-01-18 DIAGNOSIS — E103592 Type 1 diabetes mellitus with proliferative diabetic retinopathy without macular edema, left eye: Secondary | ICD-10-CM

## 2024-01-18 DIAGNOSIS — E1065 Type 1 diabetes mellitus with hyperglycemia: Secondary | ICD-10-CM

## 2024-01-18 NOTE — Patient Instructions (Addendum)
???   2 ???? ?? ???????????? ?? ???? ?????? ?6 ???? ?? ???????????? ?? ???? ????? ?12 ???? ?? ???????????? ?? ???? ?????    Enter 2 grams of carbohydrates  with a snack, 6 grams of carbohydrates with a small meal and 12 grams of carbohydrates with large meal    HOW TO TREAT LOW BLOOD SUGARS (Blood sugar LESS THAN 70 MG/DL) Please follow the RULE OF 15 for the treatment of hypoglycemia treatment (when your (blood sugars are less than 70 mg/dL)   STEP 1: Take 15 grams of carbohydrates when your blood sugar is low, which includes:  3-4 GLUCOSE TABS  OR 3-4 OZ OF JUICE OR REGULAR SODA OR ONE TUBE OF GLUCOSE GEL    STEP 2: RECHECK blood sugar in 15 MINUTES STEP 3: If your blood sugar is still low at the 15 minute recheck --> then, go back to STEP 1 and treat AGAIN with another 15 grams of carbohydrates.

## 2024-01-18 NOTE — Progress Notes (Signed)
 Name: zamia tyminski  MRN/ DOB: [id number], 1986/03/12   Age/ Sex: 38 y.o., female    PCP: Georganna Skeans, MD   Reason for Endocrinology Evaluation: Type 1 Diabetes Mellitus     Date of Initial Endocrinology Visit: 03/18/2022    PATIENT IDENTIFIER: Ms. britni driscoll is a 38 y.o. female with a past medical history of T1 DM. The patient presented for initial endocrinology clinic visit on 03/18/2022 for consultative assistance with her diabetes management.    HPI: Ms. Waltermire was    Diagnosed with T1DM at age 84          Hemoglobin A1c has ranged from 8.4% in 2023, peaking at 13.0% in 2020. Patient has required hospitalization within the last 1 year from hyper or hypoglycemia: Yes 02/2022 with DKA  She is currently 24.5 weeks of gestation with a boy  She has 7 months old baby   Paternal aunts with T1 diabetes   Pt with tingling of hands and feet, she has partial ring finger amputations B/L    S/P delivery 6/27/2023[facility] Lunch  She was off the insulin pump 03/2023 until 10/2023   SUBJECTIVE:   During the last visit (10/15/2023): A1c 10.9%      Today (01/18/24): [name] is here for a follow up on diabetes management.  She checks glucose multiple times daily through Dexcom.  She was retrained on the Omnipod 10/2023 Denies abdominal pain  Denies constipation or diarrhea   This patient with type 1 diabetes is treated with Omnipod (insulin pump). During the visit the pump basal and bolus doses were reviewed including carb/insulin rations and supplemental doses. The clinical list was updated. The glucose meter download was reviewed in detail to determine if the current pump settings are providing the best glycemic control without excessive hypoglycemia.  Pump and meter download:    Pump   Omnipod Settings   Insulin type   Novolog   Basal rate       0000  1.0 u/h               I:C ratio       0000 1:1                   Sensitivity       0000  45      Goal       0000  120            Type & Model of Pump: omnipod Insulin Type: Currently using Novolog.    PUMP STATISTICS: Average BG: 231  Average Daily Carbs (g): 16.4  Average Total Daily Insulin: 54.5  Average Daily Basal: 39.1 (72 %) Average Daily Bolus: 15.3 (28 %)    HOME DIABETES REGIMEN: Novolog     CONTINUOUS GLUCOSE MONITORING RECORD INTERPRETATION    Dates of Recording: 2/12-2/25/2025  Sensor description:dexcom  Results statistics:   CGM use % of time 71.5  Average and SD 231/84  Time in range 30  %  % Time Above 180 28  % Time above 250 41  % Time Below target 1   Glycemic patterns summary: BGs are elevated postprandial, but return to normal between the meals Hyperglycemic episodes postprandial  Hypoglycemic episodes occurred N/A  Overnight periods: Mostly optimal   DIABETIC COMPLICATIONS: Microvascular complications:  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 Denies: CKD  Last eye exam: Completed 11/23/2023  Macrovascular complications:   Denies: CAD, PVD, CVA   PAST HISTORY: Past Medical History:  Past Medical History:  Diagnosis Date   Chronic hypertension    Complication of anesthesia    with hand surgery, local anes did not work, tried twice- had to put her to sleep   Diabetic retinopathy (HCC)    DKA (diabetic ketoacidosis) (HCC) 10/2020   Epidural hematoma (HCC) 07/04/2021   Hypertension in pregnancy, preeclampsia, severe, delivered 12/15/2021   Obstetric vaginal laceration with type 3c third degree perineal laceration 06/29/2021   Pyelonephritis 10/2020   Rh negative status during pregnancy 05/01/2021   s/p Rhogam 05/13/21   Type I diabetes mellitus (HCC)    dx 39yrs ago   UTI (urinary tract infection)    Past Surgical History:  Past Surgical History:  Procedure Laterality Date   CESAREAN SECTION  05/16/2022   Procedure: CESAREAN SECTION;  Surgeon: Warden Fillers, MD;  Location: [facility];   Service: Obstetrics;;   female circumcision Bilateral    clitorectomy as well   FOOT SURGERY  2018   HAND SURGERY  2019    Social History:  reports that she has never smoked. She has never used smokeless tobacco. She reports that she does not drink alcohol and does not use drugs. Family History:  Family History  Problem Relation Age of Onset   Hyperlipidemia Mother    Hypertension Mother    Kidney disease Father    Alzheimer's disease Father      HOME MEDICATIONS: Allergies as of 01/18/2024       Reactions   Pork-derived Products         Medication List        Accurate as of January 18, 2024 10:52 AM. If you have any questions, ask your nurse or doctor.          STOP taking these medications    methylPREDNISolone 4 MG Tbpk tablet Commonly known as: MEDROL DOSEPAK Stopped by: Johnney Ou Tadarius Maland       TAKE these medications    aspirin 81 MG chewable tablet Chew by mouth.   Dexcom G7 Receiver Devi Use to check blood sugar continuously throughout the day.   Dexcom G7 Sensor Misc Use to check blood sugar continuously throughout the day. Change sensors once every 10 days.   Droplet Pen Needles 31G X 5 MM Misc Generic drug: Insulin Pen Needle use at bedtime.   fluconazole 150 MG tablet Commonly known as: DIFLUCAN Take 150 mg by mouth once.   insulin aspart 100 UNIT/ML injection Commonly known as: NovoLOG Inject 70 units max daily via insulin pump What changed: Another medication with the same name was removed. Continue taking this medication, and follow the directions you see here. Changed by: Johnney Ou Jahne Krukowski   labetalol 300 MG tablet Commonly known as: NORMODYNE Take 1 tablet (300 mg total) by mouth 2 (two) times daily.   Lantus SoloStar 100 UNIT/ML Solostar Pen Generic drug: insulin glargine Inject 34 Units into the skin daily.   NIFEdipine 60 MG 24 hr tablet Commonly known as: ADALAT CC TAKE 1 TABLET BY MOUTH 2 TIMES DAILY.    Omnipod 5 DexG7G6 Intro Gen 5 Kit 1 DEVICE BY DOES NOT APPLY ROUTE EVERY OTHER DAY. What changed: Another medication with the same name was removed. Continue taking this medication, and follow the directions you see here. Changed by: Johnney Ou Derik Fults   PrePLUS 27-1 MG Tabs Take 1 tablet by mouth daily.         ALLERGIES: Allergies  Allergen Reactions   Pork-Derived Products  REVIEW OF SYSTEMS: A comprehensive ROS was conducted with the patient and is negative except as per HPI    OBJECTIVE:   VITAL SIGNS: BP 104/70 (BP Location: Left Arm, Patient Position: Sitting, Cuff Size: Small)   Pulse 80   Ht 5\' 2"  (1.575 m)   Wt 130 lb (59 kg)   SpO2 96%   BMI 23.78 kg/m    PHYSICAL EXAM:  General: Pt appears well and is in NAD  Lungs: Clear with good BS bilat with no rales, rhonchi, or wheezes  Heart: RRR   Extremities:  Lower extremities - No pretibial edema.  Neuro: MS is good with appropriate affect, pt is alert and Ox3    DM foot exam: 10/15/2023  The skin of the feet is without sores or ulcerations. The pedal pulses are 2+ on right and 2+ on left. The sensation is decreased to a screening 5.07, 10 gram monofilament at the left great toe   DATA REVIEWED:  Lab Results  Component Value Date   HGBA1C 10.1 (A) 12/24/2023   HGBA1C 10.7 (A) 09/22/2023   HGBA1C 13.9 (A) 07/23/2023      ASSESSMENT / PLAN / RECOMMENDATIONS:   1) Type 1 Diabetes Mellitus, poorly controlled, With neuropathic, and retinopathic and neuropathic complications - Most recent A1c of  10.9 %. Goal A1c < 7.0%.    -I have reviewed OmniPod/CGM download.  The patient has optimal BG's overnight and between the meals, but unfortunately she continues not to bolus with each meal on consistent basis which results in postprandial hyperglycemia  -She was given clear instructions today on the need to bolus with 2 units for snack, 6 units for small meal, and 12 units with a regular meal -She  has been snacking on oranges and peanuts without bolusing -Today we discussed troubleshooting of the pump, for some reason she thought that if she does not have a Dexcom she could not use the pump to bolus for herself, I did explain the function of Dexcom as well as OmniPod, and she was advised that if she does not have a Dexcom, she should check glucose and entered into the pump and she should be able to use the pump to bolus for meals -She also was under the impression that if she runs out of insulin sooner, she could not change the pod, patient advised that she could change the pod every 2 days if needed -Patient did not understand why the pump goes into manual mode, I did explain this is due to hypoglycemia and she should use a fingerstick to check glucose and to return to the pump and bolus, we discussed the importance of staying in automatic mode is much as possible.   MEDICATIONS: Novolog    Pump   Omnipod Settings   Insulin type   Novolog   Basal rate       0000  1.0 u/h               I:C ratio       0000 1:1    Enter #12g TID, 6 g with small meal and 2 g with snacks              Sensitivity       0000  45      Goal       0000  120         EDUCATION / INSTRUCTIONS: BG monitoring instructions: Patient is instructed to check her blood sugars 4 times a  day, fasting and 2 hours post meals. Call [place] [place] [facility] if: BG persistently < 60  I reviewed the Rule of 15 for the treatment of hypoglycemia in detail with the patient. Literature supplied.   2) Diabetic complications:  Eye: Does  have known diabetic retinopathy.  Neuro/ Feet: Does  have known diabetic peripheral neuropathy. Renal: Patient does not have known baseline CKD. She is not on an ACEI/ARB at present.    Follow-up in 4 months  I spent 30 minutes preparing to see the patient by review of recent labs, imaging and procedures, obtaining and reviewing separately obtained history, communicating  with the patient, ordering medications, tests or procedures, and documenting clinical information in the EHR including the differential Dx, treatment, and any further evaluation and other management    Signed electronically by: Lyndle Herrlich, MD  [facility]  [facility] [address] [place] [place], [place] [address] Phone: [phone] FAX: [phone]   CC: Georganna Skeans, MD [address] [place] [address] Phone: [phone]  Fax: [phone]    Return to Endocrinology clinic as below: Future Appointments  Date Time Provider Department Center  02/11/2024 10:30 AM Drucilla Chalet, RPH-CPP [facility] None  02/18/2024  8:45 AM Rennis Chris, MD [facility] None  04/20/2024  9:50 AM Robet Crutchfield, Konrad Dolores, MD [facility] None

## 2024-01-28 ENCOUNTER — Other Ambulatory Visit: Payer: Self-pay | Admitting: Family Medicine

## 2024-01-28 ENCOUNTER — Other Ambulatory Visit: Payer: Self-pay

## 2024-01-28 MED ORDER — OMNIPOD 5 G7 PODS (GEN 5) MISC: 1.0000 | 2 refills | Status: DC

## 2024-02-11 ENCOUNTER — Ambulatory Visit: Payer: Self-pay | Admitting: Pharmacist

## 2024-02-15 ENCOUNTER — Other Ambulatory Visit: Payer: Self-pay

## 2024-02-17 NOTE — Progress Notes (Signed)
 [facility] - Clinic Note  02/18/2024     CHIEF COMPLAINT Patient presents for Retina Follow Up   HISTORY OF PRESENT ILLNESS: Kristin Clay TIFFANCY MOGER is a 38 y.o. female who presents to the clinic today for:   HPI     Retina Follow Up   Patient presents with  Diabetic Retinopathy.  In both eyes.  This started 6 weeks ago.  Duration of 6 weeks.  Since onset it is stable.  I, the attending physician,  performed the HPI with the patient and updated documentation appropriately.        Comments   6 week retina follow up PDR OU pt is reporting no vision changes noticed she denies any flashes or floaters she is having higher reading with the last one this morning over 370       Last edited by Rennis Chris, MD on 02/18/2024 11:44 AM.    Patient states vision is blurry this morning  Referring physician: Aura Camps MD [address] [place], [place] [address]  HISTORICAL INFORMATION:   Selected notes from the MEDICAL RECORD NUMBER Referred by Dr. Karleen Hampshire for diabetic retinal evaluation LEE:  Ocular Hx- PMH-pregnancy, DM    CURRENT MEDICATIONS: No current outpatient medications on file. (Ophthalmic Drugs)   No current facility-administered medications for this visit. (Ophthalmic Drugs)   Current Outpatient Medications (Other)  Medication Sig   aspirin 81 MG chewable tablet Chew by mouth.   Continuous Glucose Receiver (DEXCOM G7 RECEIVER) DEVI Use to check blood sugar continuously throughout the day.   Continuous Glucose Sensor (DEXCOM G7 SENSOR) MISC Use to check blood sugar continuously throughout the day. Change sensors once every 10 days.   fluconazole (DIFLUCAN) 150 MG tablet Take 150 mg by mouth once. (Patient not taking: Reported on 01/18/2024)   insulin aspart (NOVOLOG) 100 UNIT/ML injection Inject 70 units max daily via insulin pump   Insulin Disposable Pump (OMNIPOD 5 DEXG7G6 INTRO GEN 5) KIT 1 DEVICE BY DOES NOT APPLY ROUTE  EVERY OTHER DAY.   Insulin Disposable Pump (OMNIPOD 5 G7 PODS, GEN 5,) MISC 1 each by Does not apply route every 3 (three) days.   insulin glargine (LANTUS SOLOSTAR) 100 UNIT/ML Solostar Pen Inject 34 Units into the skin daily.   Insulin Pen Needle 31G X 5 MM MISC use at bedtime.   labetalol (NORMODYNE) 300 MG tablet Take 1 tablet (300 mg total) by mouth 2 (two) times daily.   NIFEdipine (ADALAT CC) 60 MG 24 hr tablet TAKE 1 TABLET BY MOUTH 2 TIMES DAILY.   Prenatal Vit-Fe Fumarate-FA (PREPLUS) 27-1 MG TABS Take 1 tablet by mouth daily.   No current facility-administered medications for this visit. (Other)   REVIEW OF SYSTEMS: ROS   Positive for: Endocrine, Eyes Negative for: Constitutional, Gastrointestinal, Neurological, Skin, Genitourinary, Musculoskeletal, HENT, Cardiovascular, Respiratory, Psychiatric, Allergic/Imm, Heme/Lymph Last edited by Etheleen Mayhew, COT on 02/18/2024  8:49 AM.     ALLERGIES Allergies  Allergen Reactions   Pork-Derived Products    PAST MEDICAL HISTORY Past Medical History:  Diagnosis Date   Chronic hypertension    Complication of anesthesia    with hand surgery, local anes did not work, tried twice- had to put her to sleep   Diabetic retinopathy (HCC)    DKA (diabetic ketoacidosis) (HCC) 10/2020   Epidural hematoma (HCC) 07/04/2021   Hypertension in pregnancy, preeclampsia, severe, delivered 12/15/2021   Obstetric vaginal laceration with type 3c third degree perineal laceration  06/29/2021   Pyelonephritis 10/2020   Rh negative status during pregnancy 05/01/2021   s/p Rhogam 05/13/21   Type I diabetes mellitus (HCC)    dx 5yrs ago   UTI (urinary tract infection)    Past Surgical History:  Procedure Laterality Date   CESAREAN SECTION  05/16/2022   Procedure: CESAREAN SECTION;  Surgeon: Warden Fillers, MD;  Location: [facility] LD ORS;  Service: Obstetrics;;   female circumcision Bilateral    clitorectomy as well   FOOT SURGERY  2018   HAND  SURGERY  2019   FAMILY HISTORY Family History  Problem Relation Age of Onset   Hyperlipidemia Mother    Hypertension Mother    Kidney disease Father    Alzheimer's disease Father    SOCIAL HISTORY Social History   Tobacco Use   Smoking status: Never   Smokeless tobacco: Never  Vaping Use   Vaping status: Never Used  Substance Use Topics   Alcohol use: Never   Drug use: Never       OPHTHALMIC EXAM:  Base Eye Exam     Visual Acuity (Snellen - Linear)       Right Left   Dist [place] 20/40 -2 20/25 -2   Dist ph [place] NI NI         Tonometry (Tonopen, 8:54 AM)       Right Left   Pressure 19 20         Pupils       Pupils Dark Light Shape React APD   Right PERRL 3 2 Round Brisk None   Left PERRL 3 2 Round Brisk None         Visual Fields       Left Right    Full Full         Extraocular Movement       Right Left    Full, Ortho Full, Ortho         Neuro/Psych     Oriented x3: Yes   Mood/Affect: Normal         Dilation     Both eyes: 2.5% Phenylephrine @ 8:54 AM           Slit Lamp and Fundus Exam     Slit Lamp Exam       Right Left   Lids/Lashes normal normal   Conjunctiva/Sclera mild melanosis, Trace Injection, +corkscrew vessels nasally mild melanosis   Cornea trace PEE Clear   Anterior Chamber deep, clear, narrow temporal angle deep and quiet   Iris round and dilated, no NVI round and dilated, no NVI   Lens 1+Cortical changes Clear   Anterior Vitreous syneresis syneresis         Fundus Exam       Right Left   Disc pink and sharp; compact; +fibrosis, fine NVD -- regressing; +PPP pink and sharp; compact; +fibrosis w/ fine NVD -- regressing   C/D Ratio 0.2 0.2   Macula good foveal reflex, scattered MA/DBH, punctate exudates and cystic changes temporal macula -- persistent, fine NVE inferior and temporal macula - regressing, ERM with striae nasally good foveal reflex, ERM with striae, scattered MA/DBH greatest temporal macula  -- improving, exudates temporally, scattered fibrosis and NV   Vessels attenuated, Tortuous, early NVE greatest inferior arcades -- ?regressing, Copper wiring attenuated, +NVE -- scattered, +fibrosis, Tortuous   Periphery attached, 360 MA/DBH, good 360 PRP laser changes and posterior fill in -- some room for fill in, scattered NV -- regressing  attached, 360 MA/DBH, good 360 PRP with room for fill in, scattered NV -- regressing, punctate fibrosis           IMAGING AND PROCEDURES  Imaging and Procedures for 02/18/2024  OCT, Retina - OU - Both Eyes       Right Eye Quality was good. Central Foveal Thickness: 234. Progression has been stable. Findings include normal foveal contour, no SRF, intraretinal hyper-reflective material, intraretinal fluid, vitreomacular adhesion , preretinal fibrosis (Persistent IRF/IRHM IT macula; +PRF overlying disc and inferior macula).   Left Eye Quality was good. Central Foveal Thickness: 251. Progression has been stable. Findings include normal foveal contour, no IRF, no SRF, epiretinal membrane, macular pucker, vitreous traction, preretinal fibrosis (Trace cystic changes temporal macula -- slightly improved, pre retinal fibrosis overlying disc extending to macula w/ mild traction -- stable).   Notes *Images captured and stored on drive  Diagnosis / Impression:  PDR w/ DME OU OD: Persistent IRF/IRHM IT macula; +PRF overlying disc and inferior macula OS: Mild cystic changes inferior macula -- stably improved, pre retinal fibrosis overlying disc extending to macula w/ mild traction -- stable  Clinical management:  See below  Abbreviations: NFP - Normal foveal profile. CME - cystoid macular edema. PED - pigment epithelial detachment. IRF - intraretinal fluid. SRF - subretinal fluid. EZ - ellipsoid zone. ERM - epiretinal membrane. ORA - outer retinal atrophy. ORT - outer retinal tubulation. SRHM - subretinal hyper-reflective material. IRHM - intraretinal  hyper-reflective material      Intravitreal Injection, Pharmacologic Agent - OD - Right Eye       Time Out 02/18/2024. 10:03 AM. Confirmed correct patient, procedure, site, and patient consented.   Anesthesia Topical anesthesia was used. Anesthetic medications included Lidocaine 2%, Proparacaine 0.5%.   Procedure Preparation included 5% betadine to ocular surface, eyelid speculum. A (32g) needle was used.   Injection: 2 mg aflibercept 2 MG/0.05ML   Route: Intravitreal, Site: Right Eye   NDC: L6038910, Lot: [id number], Expiration date: 04/21/2025, Waste: 0 mL   Post-op Post injection exam found visual acuity of at least counting fingers. The patient tolerated the procedure well. There were no complications. The patient received written and verbal post procedure care education.      Intravitreal Injection, Pharmacologic Agent - OS - Left Eye       Time Out 02/18/2024. 10:03 AM. Confirmed correct patient, procedure, site, and patient consented.   Anesthesia Topical anesthesia was used. Anesthetic medications included Lidocaine 2%, Proparacaine 0.5%.   Procedure Preparation included 5% betadine to ocular surface, eyelid speculum. A (32g) needle was used.   Injection: 2 mg aflibercept 2 MG/0.05ML   Route: Intravitreal, Site: Left Eye   NDC: L6038910, Lot: [id number], Expiration date: 12/23/2024, Waste: 0 mL   Post-op Post injection exam found visual acuity of at least counting fingers. The patient tolerated the procedure well. There were no complications. The patient received written and verbal post procedure care education. Post injection medications were not given.            ASSESSMENT/PLAN:    ICD-10-CM   1. 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 Current  use of insulin (HCC)  Z79.4     3. Epiretinal membrane (ERM) of both eyes  H35.373     4. Essential hypertension  I10      1-3. 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, both eyes - last A1c 10.1 on 01.31.25, 10.7 (10.30.24), 13.9 (08.30.24), 10.9 (02.16.24), 14.5 (10.10.23), 7.4 (06.06.23) - delayed to follow up from 4 weeks to 9 weeks (02.05.24 to 04.09.24)  - s/p PRP OD (03.27.23), fill in (05.15.23)  - s/p PRP OS (04.17.23), fill in (10.20.23) - s/p IVA OS #1 (12.08.23), #2 (01.08.24), #3 (04.09.24), #4 (05.21.24), #5 (07.01.24),  #6 (8.12.24), #7 (09.27.24), #8 (11.19.24), #9 (12.31.24) #10(02.11.25) - s/p IVA OD #1 (02.05.24), #2 (04.09.24), #3 (05.21.24), #4 (07.01.24), #5 (08.12.24), #6 (09.27.24), #7 (11.19.24), #8 (12.31.24) #9(02.11.25) - BCVA OD 20/25 from 20/20, OS 20/20 - stable - FA 10.20.23 shows 360 midzonal NVE w/ leakage OU - OCT shows OD: Persistent IRF/IRHM IT macula; +PRF overlying disc and inferior macula; OS: Mild cystic changes inferior macula -- stably improved, pre retinal fibrosis overlying disc extending to macula w/ mild traction -- stable at 6+ weeks **discussed decreased efficacy / resistance to Avastin and potential benefit of switching from Avastin to [facility]** - recommend switching to IVE OU #1 today, 03.28.25 with follow up in 6 weeks - pt wishes to proceed with injections - RBA of procedure discussed, questions answered - IVE informed consent obtained and signed, 03.28.25 (OU) - see procedure note - approved for Eylea for 2025 - f/u 6 weeks -- DFE/OCT, possible injections  4-6. History of high risk pregnancy w/ +HTN and hypertensive retinopathy - s/p Cesarean 06.24.23 (baby boy) - 33 weeks and 1 day, 3lbs, now out of the NICU and at home - history of pre-eclampsia - BP improved since delivery - discussed importance of tight  BP control  - continue to monitor  Ophthalmic Meds Ordered this visit:  Meds ordered this encounter  Medications   aflibercept (EYLEA) SOLN 2 mg   aflibercept (EYLEA) SOLN 2 mg     Return in about 6 weeks (around 03/31/2024) for f/u PDR OU, DFE, OCT, Possible Injxn.  There are no Patient Instructions on file for this visit.  This document serves as a record of services personally performed by Karie Chimera, MD, PhD. It was created on their behalf by Berlin Hun COT, an ophthalmic technician. The creation of this record is the provider's dictation and/or activities during the visit.    Electronically signed by: Berlin Hun COT 03.28.25 11:45 AM  This document serves as a record of services personally performed by Karie Chimera, MD, PhD. It was created on their behalf by Glee Arvin. Manson Passey, OA an ophthalmic technician. The creation of this record is the provider's dictation and/or activities during the visit.    Electronically signed by: Glee Arvin. Manson Passey, OA 02/18/24 11:45 AM  Karie Chimera, M.D., Ph.D. Diseases & Surgery of the Retina and Vitreous [facility] 02/18/2024   I have reviewed the above documentation for accuracy and completeness, and I agree with the above. Karie Chimera, M.D., Ph.D. 02/18/24 11:50 AM   Abbreviations: M myopia (nearsighted); A astigmatism; H hyperopia (farsighted); P presbyopia; Mrx spectacle prescription;  CTL contact lenses; OD right eye; OS left eye; OU both eyes  XT exotropia; ET esotropia; PEK punctate epithelial keratitis; PEE punctate epithelial erosions; DES dry eye syndrome; MGD meibomian gland dysfunction; ATs artificial tears; PFAT's preservative free artificial tears; NSC nuclear  sclerotic cataract; PSC posterior subcapsular cataract; ERM epi-retinal membrane; PVD posterior vitreous detachment; RD retinal detachment; DM diabetes mellitus; DR diabetic retinopathy; NPDR non-proliferative diabetic  retinopathy; PDR proliferative diabetic retinopathy; CSME clinically significant macular edema; DME diabetic macular edema; dbh dot blot hemorrhages; CWS cotton wool spot; POAG primary open angle glaucoma; C/D cup-to-disc ratio; HVF humphrey visual field; GVF goldmann visual field; OCT optical coherence tomography; IOP intraocular pressure; BRVO Branch retinal vein occlusion; CRVO central retinal vein occlusion; CRAO central retinal artery occlusion; BRAO branch retinal artery occlusion; RT retinal tear; SB scleral buckle; PPV pars plana vitrectomy; VH Vitreous hemorrhage; PRP panretinal laser photocoagulation; IVK intravitreal kenalog; VMT vitreomacular traction; MH Macular hole;  NVD neovascularization of the disc; NVE neovascularization elsewhere; AREDS age related eye disease study; ARMD age related macular degeneration; POAG primary open angle glaucoma; EBMD epithelial/anterior basement membrane dystrophy; ACIOL anterior chamber intraocular lens; IOL intraocular lens; PCIOL posterior chamber intraocular lens; Phaco/IOL phacoemulsification with intraocular lens placement; PRK photorefractive keratectomy; LASIK laser assisted in situ keratomileusis; HTN hypertension; DM diabetes mellitus; COPD chronic obstructive pulmonary disease

## 2024-02-18 ENCOUNTER — Ambulatory Visit (INDEPENDENT_AMBULATORY_CARE_PROVIDER_SITE_OTHER): Payer: Medicaid Other | Admitting: Ophthalmology

## 2024-02-18 ENCOUNTER — Encounter (INDEPENDENT_AMBULATORY_CARE_PROVIDER_SITE_OTHER): Payer: Self-pay | Admitting: Ophthalmology

## 2024-02-18 DIAGNOSIS — Z794 Long term (current) use of insulin: Secondary | ICD-10-CM

## 2024-02-18 DIAGNOSIS — H35373 Puckering of macula, bilateral: Secondary | ICD-10-CM

## 2024-02-18 DIAGNOSIS — I10 Essential (primary) hypertension: Secondary | ICD-10-CM | POA: Diagnosis not present

## 2024-02-18 DIAGNOSIS — E103513 Type 1 diabetes mellitus with proliferative diabetic retinopathy with macular edema, bilateral: Secondary | ICD-10-CM | POA: Diagnosis not present

## 2024-02-18 MED ORDER — AFLIBERCEPT 2MG/0.05ML IZ SOLN FOR KALEIDOSCOPE
2.0000 mg | INTRAVITREAL | Status: AC | PRN
  Administered 2024-02-18: 2 mg via INTRAVITREAL

## 2024-02-21 ENCOUNTER — Other Ambulatory Visit: Payer: Self-pay

## 2024-03-16 ENCOUNTER — Other Ambulatory Visit: Payer: Self-pay

## 2024-03-20 ENCOUNTER — Other Ambulatory Visit: Payer: Self-pay

## 2024-03-30 NOTE — Progress Notes (Signed)
 [facility] - Clinic Note  03/31/2024     CHIEF COMPLAINT Patient presents for Retina Follow Up   HISTORY OF PRESENT ILLNESS: Kristin Clay is a 38 y.o. female who presents to the clinic today for:   HPI     Retina Follow Up   Patient presents with  Diabetic Retinopathy.  In both eyes.  This started 6 weeks ago.  Duration of 6 weeks.  Since onset it is stable.        Comments   6 week retina follow up PDR OU and I'VE OU pt is reporting no vision changes noticed she denies any flashes or floaters her last reading was 300 this am       Last edited by Alise Appl, COT on 03/31/2024  8:34 AM.     Patient states that she saw more floaters after the last injection.   Referring physician: Lorena Rolling MD [address], [address] [place], [place] [address]  HISTORICAL INFORMATION:   Selected notes from the MEDICAL RECORD NUMBER Referred by Dr. Jolena Nay for diabetic retinal evaluation LEE:  Ocular Hx- PMH-pregnancy, DM    CURRENT MEDICATIONS: No current outpatient medications on file. (Ophthalmic Drugs)   No current facility-administered medications for this visit. (Ophthalmic Drugs)   Current Outpatient Medications (Other)  Medication Sig   aspirin  81 MG chewable tablet Chew by mouth.   Continuous Glucose Receiver (DEXCOM G7 RECEIVER) DEVI Use to check blood sugar continuously throughout the day.   Continuous Glucose Sensor (DEXCOM G7 SENSOR) MISC Use to check blood sugar continuously throughout the day. Change sensors once every 10 days.   fluconazole  (DIFLUCAN ) 150 MG tablet Take 150 mg by mouth once. (Patient not taking: Reported on 01/18/2024)   insulin  aspart (NOVOLOG ) 100 UNIT/ML injection Inject 70 units max daily via insulin  pump   Insulin  Disposable Pump (OMNIPOD 5 DEXG7G6 INTRO GEN 5) KIT 1 DEVICE BY DOES NOT APPLY ROUTE EVERY OTHER DAY.   Insulin  Disposable Pump (OMNIPOD 5 G7 PODS, GEN 5,) MISC 1 each by Does not  apply route every 3 (three) days.   insulin  glargine (LANTUS  SOLOSTAR) 100 UNIT/ML Solostar Pen Inject 34 Units into the skin daily.   Insulin  Pen Needle 31G X 5 MM MISC use at bedtime.   labetalol  (NORMODYNE ) 300 MG tablet Take 1 tablet (300 mg total) by mouth 2 (two) times daily.   NIFEdipine  (ADALAT  CC) 60 MG 24 hr tablet TAKE 1 TABLET BY MOUTH 2 TIMES DAILY.   Prenatal Vit-Fe Fumarate-FA (PREPLUS) 27-1 MG TABS Take 1 tablet by mouth daily.   No current facility-administered medications for this visit. (Other)   REVIEW OF SYSTEMS: ROS   Positive for: Endocrine, Eyes Negative for: Constitutional, Gastrointestinal, Neurological, Skin, Genitourinary, Musculoskeletal, HENT, Cardiovascular, Respiratory, Psychiatric, Allergic/Imm, Heme/Lymph Last edited by Alise Appl, COT on 03/31/2024  8:34 AM.      ALLERGIES Allergies  Allergen Reactions   Pork-Derived Products    PAST MEDICAL HISTORY Past Medical History:  Diagnosis Date   Chronic hypertension    Complication of anesthesia    with hand surgery, local anes did not work, tried twice- had to put her to sleep   Diabetic retinopathy (HCC)    DKA (diabetic ketoacidosis) (HCC) 10/2020   Epidural hematoma (HCC) 07/04/2021   Hypertension in pregnancy, preeclampsia, severe, delivered 12/15/2021   Obstetric vaginal laceration with type 3c third degree perineal laceration 06/29/2021   Pyelonephritis 10/2020   Rh negative status  during pregnancy 05/01/2021   s/p Rhogam 05/13/21   Type I diabetes mellitus (HCC)    dx 63yrs ago   UTI (urinary tract infection)    Past Surgical History:  Procedure Laterality Date   CESAREAN SECTION  05/16/2022   Procedure: CESAREAN SECTION;  Surgeon: Abigail Abler, MD;  [facility]: [facility];  Service: Obstetrics;;   female circumcision Bilateral    clitorectomy as well   FOOT SURGERY  2018   HAND SURGERY  2019   FAMILY HISTORY Family History  Problem Relation Age of Onset   Hyperlipidemia  Mother    Hypertension Mother    Kidney disease Father    Alzheimer's disease Father    SOCIAL HISTORY Social History   Tobacco Use   Smoking status: Never   Smokeless tobacco: Never  Vaping Use   Vaping status: Never Used  Substance Use Topics   Alcohol use: Never   Drug use: Never       OPHTHALMIC EXAM:  Base Eye Exam     Visual Acuity (Snellen - Linear)       Right Left   Dist [place] 20/25 -1 20/25   Dist ph Rudy NI NI         Tonometry (Tonopen, 8:36 AM)       Right Left   Pressure 17 18         Pupils       Pupils Dark Light Shape React APD   Right PERRL 3 2 Round Brisk None   Left PERRL 3 2 Round Brisk None         Visual Fields       Left Right    Full Full         Extraocular Movement       Right Left    Full, Ortho Full, Ortho         Neuro/Psych     Oriented x3: Yes   Mood/Affect: Normal         Dilation     Both eyes: 2.5% Phenylephrine  @ 8:36 AM           Slit Lamp and Fundus Exam     Slit Lamp Exam       Right Left   Lids/Lashes normal normal   Conjunctiva/Sclera mild melanosis, Trace Injection, +corkscrew vessels nasally mild melanosis   Cornea trace PEE Clear   Anterior Chamber deep, clear, narrow temporal angle deep and quiet   Iris round and dilated, no NVI round and dilated, no NVI   Lens 1+Cortical changes Clear   Anterior Vitreous syneresis syneresis         Fundus Exam       Right Left   Disc pink and sharp; compact; +fibrosis, fine NVD -- regressing; +PPP pink and sharp; compact; +fibrosis w/ fine NVD -- regressing   C/D Ratio 0.2 0.2   Macula good foveal reflex, scattered MA/DBH, punctate exudates and cystic changes temporal macula -- persistent, fine NVE inferior and temporal macula - regressing, ERM with striae nasally good foveal reflex, ERM with striae, scattered MA/DBH greatest temporal macula -- improving, exudates temporally, scattered fibrosis and NV   Vessels attenuated, Tortuous, early  NVE greatest inferior arcades -- ?regressing, Copper wiring attenuated, +NVE -- scattered, +fibrosis, Tortuous   Periphery attached, 360 MA/DBH, good 360 PRP laser changes and posterior fill in -- some room for fill in, scattered NV -- regressing attached, 360 MA/DBH, good 360 PRP with room for fill in,  scattered NV -- regressing, punctate fibrosis           IMAGING AND PROCEDURES  Imaging and Procedures for 03/31/2024  OCT, Retina - OU - Both Eyes        Right Eye Quality was good. Central Foveal Thickness: 231. Progression has been stable. Findings include normal foveal contour, no SRF, intraretinal hyper-reflective material, intraretinal fluid, vitreomacular adhesion , preretinal fibrosis (Persistent IRF/IRHM IT macula; +PRF overlying disc and inferior macula).   Left Eye Quality was good. Central Foveal Thickness: 248. Progression has been stable. Findings include normal foveal contour, no IRF, no SRF, epiretinal membrane, macular pucker, vitreous traction, preretinal fibrosis (Trace cystic changes temporal macula -- slightly improved, pre retinal fibrosis overlying disc extending to macula w/ mild traction -- stable).   Notes  *Images captured and stored on drive  Diagnosis / Impression:  PDR w/ DME OU OD: Persistent IRF/IRHM IT macula; +PRF overlying disc and inferior macula OS: Mild cystic changes inferior macula -- stably improved, pre retinal fibrosis overlying disc extending to macula w/ mild traction -- stable  Clinical management:  See below  Abbreviations: NFP - Normal foveal profile. CME - cystoid macular edema. PED - pigment epithelial detachment. IRF - intraretinal fluid. SRF - subretinal fluid. EZ - ellipsoid zone. ERM - epiretinal membrane. ORA - outer retinal atrophy. ORT - outer retinal tubulation. SRHM - subretinal hyper-reflective material. IRHM - intraretinal hyper-reflective material      Intravitreal Injection, Pharmacologic Agent - OD - Right Eye        Time Out 03/31/2024. 9:53 AM. Confirmed correct patient, procedure, site, and patient consented.   Anesthesia Topical anesthesia was used. Anesthetic medications included Lidocaine  2%, Proparacaine 0.5%.   Procedure Preparation included 5% betadine to ocular surface, eyelid speculum. A (32g) needle was used.   Injection: 2 mg aflibercept  2 MG/0.05ML   Route: Intravitreal, Site: Right Eye   NDC: Q956576, Waste: 0 mL   Post-op Post injection exam found visual acuity of at least counting fingers. The patient tolerated the procedure well. There were no complications. The patient received written and verbal post procedure care education.      Intravitreal Injection, Pharmacologic Agent - OS - Left Eye       Time Out 03/31/2024. 9:53 AM. Confirmed correct patient, procedure, site, and patient consented.   Anesthesia Topical anesthesia was used. Anesthetic medications included Lidocaine  2%, Proparacaine 0.5%.   Procedure Preparation included 5% betadine to ocular surface, eyelid speculum. A (32g) needle was used.   Injection: 2 mg aflibercept  2 MG/0.05ML   Route: Intravitreal, Site: Left Eye   NDC: Q956576, Waste: 0 mL   Post-op Post injection exam found visual acuity of at least counting fingers. The patient tolerated the procedure well. There were no complications. The patient received written and verbal post procedure care education. Post injection medications were not given.             ASSESSMENT/PLAN:    ICD-10-CM   1. 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 Current use of insulin  (HCC)  Z79.4     3. Epiretinal membrane (ERM) of both eyes  H35.373     4. Essential hypertension  I10     5. Hypertensive  retinopathy of both eyes  H35.033     6. High-risk pregnancy in second trimester  O09.92       1-3. 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, both eyes - last A1c 10.1 (01.31.25), 10.7 (10.30.24), 13.9 (08.30.24), 10.9 (02.16.24), 14.5 (10.10.23), 7.4 (06.06.23) - delayed to follow up from 4 weeks to 9 weeks (02.05.24 to 04.09.24)  - s/p PRP OD (03.27.23), fill in (05.15.23)  - s/p PRP OS (04.17.23), fill in (10.20.23) - s/p IVA OS #1 (12.08.23), #2 (01.08.24), #3 (04.09.24), #4 (05.21.24), #5 (07.01.24), #6 (8.12.24), #7 (09.27.24), #8 (11.19.24), #9 (12.31.24),  #10 (02.11.25) - s/p IVA OD #1 (02.05.24), #2 (04.09.24), #3 (05.21.24), #4 (07.01.24), #5 (08.12.24), #6 (09.27.24), #7 (11.19.24), #8 (12.31.24), #9 (02.11.25) - IVA resistance ====================== -s/p IVE OU #1  (03.28.25) - BCVA OD 20/25 OU - FA 10.20.23 shows 360 midzonal NVE w/ leakage OU - OCT shows OD: Persistent IRF/IRHM IT macula; +PRF overlying disc and inferior macula; OS: Mild cystic changes inferior macula, pre retinal fibrosis overlying disc extending to macula w/ mild traction -- stable at 6+ weeks **discussed decreased efficacy / resistance to Avastin  and potential benefit of switching from Avastin  to Eylea ** -  IVE OU #2 today, 05.09.25 with follow up in 6 weeks - pt wishes to proceed with injections - RBA of procedure discussed, questions answered - IVE informed consent obtained and signed, 03.28.25 (OU) - see procedure note - approved for Eylea  for 2025 - f/u 6 weeks -- DFE/OCT, possible injections  4-6. History of high risk pregnancy w/ +HTN and hypertensive retinopathy - s/p Cesarean 06.24.23 (baby boy) - 33 weeks and 1 day, 3lbs, now out of the NICU and at home - history of pre-eclampsia - BP improved since delivery - discussed importance of tight BP control  - continue to monitor  Ophthalmic Meds Ordered this visit:  No orders of the defined types were placed in this encounter.     Return in about 6 weeks (around 05/12/2024) for f/u PDR OU , DFE, OCT, Possible, IVE, OU.  There are no Patient Instructions on file for this visit.  This document serves as a record of services personally performed by Jeanice Millard, MD, PhD. It was created on their behalf by Eller Gut COT, an ophthalmic technician. The creation of this record is the provider's dictation and/or activities during the visit.    Electronically signed by: Eller Gut COT 05.08.2025 9:56 AM  This document serves as a record of services personally performed by Jeanice Millard, MD, PhD. It was created on their behalf by Olene Berne, COT an ophthalmic technician. The creation of this record is the provider's dictation and/or activities during the visit.    Electronically signed by:  Olene Berne, COT  03/31/24 9:56 AM    Abbreviations: M myopia (nearsighted); A astigmatism; H hyperopia (farsighted); P presbyopia; Mrx spectacle prescription;  CTL contact lenses; OD right eye; OS left eye; OU both eyes  XT exotropia; ET esotropia; PEK punctate epithelial keratitis; PEE punctate epithelial erosions; DES dry eye syndrome; MGD meibomian gland dysfunction; ATs artificial tears; PFAT's preservative free artificial tears; NSC nuclear sclerotic cataract; PSC posterior subcapsular cataract; ERM epi-retinal membrane; PVD posterior vitreous detachment; RD retinal detachment; DM diabetes mellitus; DR diabetic retinopathy; NPDR non-proliferative diabetic retinopathy; PDR proliferative diabetic retinopathy; CSME clinically significant macular edema; DME diabetic macular edema; dbh dot blot hemorrhages; CWS cotton wool spot; POAG primary open angle glaucoma; C/D cup-to-disc ratio; HVF humphrey visual field; GVF  goldmann visual field; OCT optical coherence tomography; IOP intraocular pressure; BRVO Branch retinal vein occlusion; CRVO central retinal vein occlusion; CRAO central retinal artery occlusion;  BRAO branch retinal artery occlusion; RT retinal tear; SB scleral buckle; PPV pars plana vitrectomy; VH Vitreous hemorrhage; PRP panretinal laser photocoagulation; IVK intravitreal kenalog ; VMT vitreomacular traction; MH Macular hole;  NVD neovascularization of the disc; NVE neovascularization elsewhere; AREDS age related eye disease study; ARMD age related macular degeneration; POAG primary open angle glaucoma; EBMD epithelial/anterior basement membrane dystrophy; ACIOL anterior chamber intraocular lens; IOL intraocular lens; PCIOL posterior chamber intraocular lens; Phaco/IOL phacoemulsification with intraocular lens placement; PRK photorefractive keratectomy; LASIK laser assisted in situ keratomileusis; HTN hypertension; DM diabetes mellitus; COPD chronic obstructive pulmonary disease

## 2024-03-31 ENCOUNTER — Encounter (INDEPENDENT_AMBULATORY_CARE_PROVIDER_SITE_OTHER): Payer: Self-pay | Admitting: Ophthalmology

## 2024-03-31 ENCOUNTER — Ambulatory Visit (INDEPENDENT_AMBULATORY_CARE_PROVIDER_SITE_OTHER): Admitting: Ophthalmology

## 2024-03-31 DIAGNOSIS — Z794 Long term (current) use of insulin: Secondary | ICD-10-CM

## 2024-03-31 DIAGNOSIS — H35373 Puckering of macula, bilateral: Secondary | ICD-10-CM | POA: Diagnosis not present

## 2024-03-31 DIAGNOSIS — H35033 Hypertensive retinopathy, bilateral: Secondary | ICD-10-CM | POA: Diagnosis not present

## 2024-03-31 DIAGNOSIS — E103513 Type 1 diabetes mellitus with proliferative diabetic retinopathy with macular edema, bilateral: Secondary | ICD-10-CM | POA: Diagnosis not present

## 2024-03-31 DIAGNOSIS — O0992 Supervision of high risk pregnancy, unspecified, second trimester: Secondary | ICD-10-CM

## 2024-03-31 DIAGNOSIS — I10 Essential (primary) hypertension: Secondary | ICD-10-CM

## 2024-03-31 MED ORDER — AFLIBERCEPT 2MG/0.05ML IZ SOLN FOR KALEIDOSCOPE
2.0000 mg | INTRAVITREAL | Status: AC | PRN
  Administered 2024-03-31: 2 mg via INTRAVITREAL

## 2024-04-20 ENCOUNTER — Encounter: Payer: Self-pay | Admitting: Internal Medicine

## 2024-04-20 ENCOUNTER — Ambulatory Visit (INDEPENDENT_AMBULATORY_CARE_PROVIDER_SITE_OTHER): Payer: Medicaid Other | Admitting: Internal Medicine

## 2024-04-20 VITALS — BP 120/80 | HR 74 | Ht 62.0 in | Wt 132.0 lb

## 2024-04-20 DIAGNOSIS — E1042 Type 1 diabetes mellitus with diabetic polyneuropathy: Secondary | ICD-10-CM | POA: Diagnosis not present

## 2024-04-20 DIAGNOSIS — E103592 Type 1 diabetes mellitus with proliferative diabetic retinopathy without macular edema, left eye: Secondary | ICD-10-CM | POA: Diagnosis not present

## 2024-04-20 DIAGNOSIS — E1065 Type 1 diabetes mellitus with hyperglycemia: Secondary | ICD-10-CM

## 2024-04-20 LAB — POCT GLYCOSYLATED HEMOGLOBIN (HGB A1C): Hemoglobin A1C: 11 % — AB (ref 4.0–5.6)

## 2024-04-20 MED ORDER — DEXCOM G7 SENSOR MISC: 1.0000 | 6 refills | Status: DC

## 2024-04-20 MED ORDER — INSULIN ASPART 100 UNIT/ML IJ SOLN: INTRAMUSCULAR | 3 refills | Status: DC

## 2024-04-20 MED ORDER — OMNIPOD 5 G7 PODS (GEN 5) MISC: 1.0000 | 3 refills | Status: DC

## 2024-04-20 NOTE — Progress Notes (Signed)
 Name: Kristin Clay  MRN/ DOB: [id number], 03/07/1986   Age/ Sex: 38 y.o., female    PCP: Abraham Abo, MD   Reason for Endocrinology Evaluation: Type 1 Diabetes Mellitus     Date of Initial Endocrinology Visit: 03/18/2022    PATIENT IDENTIFIER: [name] is a 38 y.o. female with a past medical history of T1 DM. The patient presented for initial endocrinology clinic visit on 03/18/2022 for consultative assistance with her diabetes management.    HPI: [name] was    Diagnosed with T1DM at age 76          Hemoglobin A1c has ranged from 8.4% in 2023, peaking at 13.0% in 2020. Patient has required hospitalization within the last 1 year from hyper or hypoglycemia: Yes 02/2022 with DKA  She is currently 24.5 weeks of gestation with a boy  She has 38 months old baby   Paternal aunts with T1 diabetes   Pt with tingling of hands and feet, she has partial ring finger amputations B/L    S/P delivery 6/27/2023Sylvester Clay  She was off the insulin  pump 03/2023 until 10/2023 when it was resumed again   SUBJECTIVE:   During the last visit (01/18/2024): A1c 10.9%    Today (04/20/24): Kristin Clay is here for a follow up on diabetes management.  She checks glucose multiple times daily through Dexcom.  She continues to follow-up with Dr. Zamora for proliferative diabetic retinopathy denies abdominal pain  Denies constipation or diarrhea  Denies nausea or vomiting  This patient with type 1 diabetes is treated with Omnipod (insulin  pump). During the visit the pump basal and bolus doses were reviewed including carb/insulin  rations and supplemental doses. The clinical list was updated. The glucose meter download was reviewed in detail to determine if the current pump settings are providing the best glycemic control without excessive hypoglycemia.  Pump and meter download:    Pump   Omnipod Settings   Insulin  type   Novolog    Basal rate        0000  1.0 u/h               I:C ratio       0000 1:1    Enter #12g TID, 6 g with small meal and 2 g with snacks              Sensitivity       0000  45      Goal       0000  120           Type & Model of Pump: omnipod Insulin  Type: Currently using Novolog .    PUMP STATISTICS: Average BG: 265 Average Daily Carbs (g):  29 Average Total Daily Insulin : 58.2 Average Daily Basal: 30.8 (53 %) Average Daily Bolus: 27.4 (47 %)    HOME DIABETES REGIMEN: Novolog      CONTINUOUS GLUCOSE MONITORING RECORD INTERPRETATION    Dates of Recording: 5/16-5/29/2025  Sensor description:dexcom  Results statistics:   CGM use % of time 66.2  Average and SD 265/87  Time in range 19%  % Time Above 180 23  % Time above 250 58  % Time Below target 0   Glycemic patterns summary: Patient has been noted with hypoglycemia throughout the day and night Hyperglycemic episodes throughout the day and night  Hypoglycemic episodes occurred N/A  Overnight periods: Mostly high   DIABETIC COMPLICATIONS: Microvascular complications:  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 Denies: CKD  Last eye exam: Completed 11/23/2023  Macrovascular complications:   Denies: CAD, PVD, CVA   PAST HISTORY: Past Medical History:  Past Medical History:  Diagnosis Date   Chronic hypertension    Complication of anesthesia    with hand surgery, local anes did not work, tried twice- had to put her to sleep   Diabetic retinopathy (HCC)    DKA (diabetic ketoacidosis) (HCC) 10/2020   Epidural hematoma (HCC) 07/04/2021   Hypertension in pregnancy, preeclampsia, severe, delivered 12/15/2021   Obstetric vaginal laceration with type 3c third degree perineal laceration 06/29/2021   Pyelonephritis 10/2020   Rh negative status during pregnancy 05/01/2021   s/p Rhogam 05/13/21   Type I diabetes mellitus (HCC)    dx 36yrs ago   UTI (urinary tract infection)    Past Surgical History:  Past Surgical History:   Procedure Laterality Date   CESAREAN SECTION  05/16/2022   Procedure: CESAREAN SECTION;  Surgeon: Abigail Abler, MD;  Location: [facility];  Service: Obstetrics;;   female circumcision Bilateral    clitorectomy as well   FOOT SURGERY  2018   HAND SURGERY  2019    Social History:  reports that she has never smoked. She has never used smokeless tobacco. She reports that she does not drink alcohol and does not use drugs. Family History:  Family History  Problem Relation Age of Onset   Hyperlipidemia Mother    Hypertension Mother    Kidney disease Father    Alzheimer's disease Father      HOME MEDICATIONS: Allergies as of 04/20/2024       Reactions   Pork-derived Products         Medication List        Accurate as of Apr 20, 2024 10:28 AM. If you have any questions, ask your nurse or doctor.          aspirin  81 MG chewable tablet Chew by mouth.   Dexcom G7 Receiver Devi Use to check blood sugar continuously throughout the day.   Dexcom G7 Sensor Misc Use to check blood sugar continuously throughout the day. Change sensors once every 10 days.   Droplet Pen Needles 31G X 5 MM Misc Generic drug: Insulin  Pen Needle use at bedtime.   fluconazole  150 MG tablet Commonly known as: DIFLUCAN  Take 150 mg by mouth once.   insulin  aspart 100 UNIT/ML injection Commonly known as: NovoLOG  Inject 70 units max daily via insulin  pump   labetalol  300 MG tablet Commonly known as: NORMODYNE  Take 1 tablet (300 mg total) by mouth 2 (two) times daily.   Lantus  SoloStar 100 UNIT/ML Solostar Pen Generic drug: insulin  glargine Inject 34 Units into the skin daily.   NIFEdipine  60 MG 24 hr tablet Commonly known as: ADALAT  CC TAKE 1 TABLET BY MOUTH 2 TIMES DAILY.   Omnipod 5 DexG7G6 Intro Gen 5 Kit 1 DEVICE BY DOES NOT APPLY ROUTE EVERY OTHER DAY.   Omnipod 5 G7 Pods (Gen 5) Misc 1 each by Does not apply route every 3 (three) days.   PrePLUS 27-1 MG Tabs Take 1 tablet by  mouth daily.         ALLERGIES: Allergies  Allergen Reactions   Pork-Derived Products      REVIEW OF SYSTEMS: A comprehensive ROS was conducted with the patient and is negative except as per HPI    OBJECTIVE:   VITAL SIGNS: BP 120/80 (BP Location: Left Arm, Patient Position: Sitting, Cuff Size: Normal)   Pulse  74   Ht 5\' 2"  (1.575 m)   Wt 132 lb (59.9 kg)   SpO2 97%   BMI 24.14 kg/m    PHYSICAL EXAM:  General: Pt appears well and is in NAD  Lungs: Clear with good BS bilat with no rales, rhonchi, or wheezes  Heart: RRR   Extremities:  Lower extremities - No pretibial edema.  Neuro: MS is good with appropriate affect, pt is alert and Ox3    DM foot exam: 10/15/2023  The skin of the feet is without sores or ulcerations. The pedal pulses are 2+ on right and 2+ on left. The sensation is decreased to a screening 5.07, 10 gram monofilament at the left great toe   DATA REVIEWED:  Lab Results  Component Value Date   HGBA1C 10.1 (A) 12/24/2023   HGBA1C 10.7 (A) 09/22/2023   HGBA1C 13.9 (A) 07/23/2023      ASSESSMENT / PLAN / RECOMMENDATIONS:   1) Type 1 Diabetes Mellitus, poorly controlled, With neuropathic, and retinopathic and neuropathic complications - Most recent A1c of  11.0 %. Goal A1c < 7.0%.    - Patient continues with hyperglycemia - In reviewing CGM/OmniPod download she continues to skip on prandial insulin  dosing - She endorses subjective hyoglycemia, I explained to the patient that she may consume either 2 tablets of glucose or 2 ounce of sugar sweetened beverage when symptomatic if her BG is >70 mg/dL.  We also reviewed the proper correction for hypoglycemia with BG reading< 70 Mg/DL by either consuming 4 sugar tablets or 4 ounces of sugar sweetened beverages - I explained to the patient the importance of lowering her glucose to prevent worsening diabetic retinopathy - I did advise the patient against conception due to hyperglycemia and it may worsen  retinopathy - She has been eating falafel without a bolus, I explained to the patient the this does have carbohydrates and she should bolus for it  -I will increase her basal rate - She was advised to enter 14 g with each meal and 6 g with a snack - I will also adjust her sensitivity factor from 45 to 40 - She continues to tell me that we have not sent prescription for her Dexcom, I did show her on the last visit that everything was sent to the pharmacy, I refilled her OmniPod, Dexcom, and insulin  to [facility].  We discussed the importance of keeping the pharmacy consistent as she does tend to fluctuate with pharmacy use   MEDICATIONS: Novolog     Pump   Omnipod Settings   Insulin  type   Novolog    Basal rate       0000  1.1 u/h               I:C ratio       0000 1:1    Enter #14g TID, 6 g with small meal and 2 g with snacks              Sensitivity       0000  40      Goal       0000  120         EDUCATION / INSTRUCTIONS: BG monitoring instructions: Patient is instructed to check her blood sugars 4 times a day, fasting and 2 hours post meals. Call [facility] if: BG persistently < 60  I reviewed the Rule of 15 for the treatment of hypoglycemia in detail with the patient. Literature supplied.   2) Diabetic complications:  Eye: Does  have known diabetic retinopathy.  Neuro/ Feet: Does  have known diabetic peripheral neuropathy. Renal: Patient does not have known baseline CKD. She is not on an ACEI/ARB at present.    Follow-up in 3 months     Signed electronically by: Natale Bail, MD  [facility]  [facility] [address] [place], [place] [address] Phone: [phone] FAX: [phone]   CC: Abraham Abo, MD [address] [place] [address] Phone: [phone]  Fax: [phone]    Return to Endocrinology clinic as below: Future Appointments  Date Time Provider Department  Center  05/12/2024  8:45 AM Ronelle Coffee, MD [facility] None

## 2024-04-20 NOTE — Patient Instructions (Signed)

## 2024-05-03 NOTE — Progress Notes (Signed)
 [facility] - Clinic Note  05/12/2024     CHIEF COMPLAINT Patient presents for Retina Follow Up   HISTORY OF PRESENT ILLNESS: Kristin Clay is a 38 y.o. female who presents to the clinic today for:   HPI     Retina Follow Up   Patient presents with  Diabetic Retinopathy.  In both eyes.  This started 2 years ago.  Duration of 6 weeks.  Since onset it is stable.  I, the attending physician,  performed the HPI with the patient and updated documentation appropriately.        Comments   6 week retina follow up PDR OU. Pt states no changes in vision. Pt denies FOL/floaters/pain. Pt does not use ats. A1c= 10.1, 12/24/2023 BS= pt does not monitor       Last edited by Ronelle Coffee, MD on 05/12/2024  5:05 PM.      Patient states her vision is good  Referring physician: Lorena Rolling MD [address], [address] [place], [place] [address]  HISTORICAL INFORMATION:   Selected notes from the MEDICAL RECORD NUMBER Referred by Dr. Jolena Nay for diabetic retinal evaluation LEE:  Ocular Hx- PMH-pregnancy, DM    CURRENT MEDICATIONS: No current outpatient medications on file. (Ophthalmic Drugs)   No current facility-administered medications for this visit. (Ophthalmic Drugs)   Current Outpatient Medications (Other)  Medication Sig   aspirin  81 MG chewable tablet Chew by mouth.   insulin  aspart (NOVOLOG ) 100 UNIT/ML injection Max daily 80 units   Insulin  Disposable Pump (OMNIPOD 5 G7 PODS, GEN 5,) MISC 1 each by Does not apply route every 3 (three) days.   labetalol  (NORMODYNE ) 300 MG tablet Take 1 tablet (300 mg total) by mouth 2 (two) times daily.   NIFEdipine  (ADALAT  CC) 60 MG 24 hr tablet TAKE 1 TABLET BY MOUTH 2 TIMES DAILY.   Prenatal Vit-Fe Fumarate-FA (PREPLUS) 27-1 MG TABS Take 1 tablet by mouth daily.   Continuous Glucose Sensor (DEXCOM G7 SENSOR) MISC 1 Device by Other route as directed. Change sensors once every 10 days. (Patient not  taking: Reported on 05/12/2024)   fluconazole  (DIFLUCAN ) 150 MG tablet Take 150 mg by mouth once. (Patient not taking: Reported on 05/12/2024)   No current facility-administered medications for this visit. (Other)   REVIEW OF SYSTEMS: ROS   Positive for: Endocrine, Eyes Negative for: Constitutional, Gastrointestinal, Neurological, Skin, Genitourinary, Musculoskeletal, HENT, Cardiovascular, Respiratory, Psychiatric, Allergic/Imm, Heme/Lymph Last edited by Carrington Clack, COT on 05/12/2024  8:30 AM.       ALLERGIES Allergies  Allergen Reactions   Pork-Derived Products    PAST MEDICAL HISTORY Past Medical History:  Diagnosis Date   Chronic hypertension    Complication of anesthesia    with hand surgery, local anes did not work, tried twice- had to put her to sleep   Diabetic retinopathy (HCC)    DKA (diabetic ketoacidosis) (HCC) 10/2020   Epidural hematoma (HCC) 07/04/2021   Hypertension in pregnancy, preeclampsia, severe, delivered 12/15/2021   Obstetric vaginal laceration with type 3c third degree perineal laceration 06/29/2021   Pyelonephritis 10/2020   Rh negative status during pregnancy 05/01/2021   s/p Rhogam 05/13/21   Type I diabetes mellitus (HCC)    dx 36yrs ago   UTI (urinary tract infection)    Past Surgical History:  Procedure Laterality Date   CESAREAN SECTION  05/16/2022   Procedure: CESAREAN SECTION;  Surgeon: Abigail Abler, MD;  Location: [facility];  Service: Obstetrics;;   female circumcision Bilateral    clitorectomy as well   FOOT SURGERY  2018   HAND SURGERY  2019   FAMILY HISTORY Family History  Problem Relation Age of Onset   Hyperlipidemia Mother    Hypertension Mother    Kidney disease Father    Alzheimer's disease Father    SOCIAL HISTORY Social History   Tobacco Use   Smoking status: Never   Smokeless tobacco: Never  Vaping Use   Vaping status: Never Used  Substance Use Topics   Alcohol use: Never   Drug use: Never        OPHTHALMIC EXAM:  Base Eye Exam     Visual Acuity (Snellen - Linear)       Right Left   Dist [place] 20/30 20/20 -1   Dist ph [place] 20/20 -2 NI         Tonometry (Tonopen, 8:44 AM)       Right Left   Pressure 16 20         Pupils       Pupils Dark Light Shape React APD   Right PERRL 4 3 Round Minimal None   Left PERRL 4 3 Round Minimal None         Visual Fields       Left Right    Full Full         Extraocular Movement       Right Left    Full, Ortho Full, Ortho         Neuro/Psych     Oriented x3: Yes   Mood/Affect: Normal         Dilation     Both eyes: 1.0% Mydriacyl, 2.5% Phenylephrine  @ 8:45 AM           Slit Lamp and Fundus Exam     Slit Lamp Exam       Right Left   Lids/Lashes normal normal   Conjunctiva/Sclera mild melanosis, Trace Injection, +corkscrew vessels nasally mild melanosis   Cornea trace PEE Clear   Anterior Chamber deep, clear, narrow temporal angle deep and quiet   Iris round and dilated, no NVI round and dilated, no NVI   Lens 1+Cortical changes Clear   Anterior Vitreous syneresis syneresis         Fundus Exam       Right Left   Disc pink and sharp; compact; +fibrosis, fine NVD -- regressing; +PPP pink and sharp; compact; +fibrosis w/ fine NVD -- regressing   C/D Ratio 0.2 0.2   Macula good foveal reflex, scattered MA/DBH, punctate exudates and cystic changes temporal macula -- persistent, fine NVE inferior and temporal macula - regressing, ERM with striae nasally good foveal reflex, ERM with striae, scattered MA/DBH greatest temporal macula, exudates temporally, scattered fibrosis and NV -- regressing   Vessels attenuated, mild tortuosity, early NVE greatest inferior arcades attenuated, +NVE -- scattered, +fibrosis, Tortuous   Periphery attached, 360 MA/DBH, good 360 PRP laser changes and posterior fill in -- some room for fill in, scattered NV -- regressing attached, 360 MA/DBH, good 360 PRP with room for fill  in, scattered NV -- regressing, punctate fibrosis           IMAGING AND PROCEDURES  Imaging and Procedures for 05/12/2024  OCT, Retina - OU - Both Eyes       Right Eye Quality was good. Central Foveal Thickness: 231. Progression has improved. Findings include normal foveal contour, no SRF, intraretinal hyper-reflective  material, intraretinal fluid, vitreomacular adhesion , preretinal fibrosis (Mild interval improvement in IRF/IRHM IT macula; +PRF overlying disc and inferior macula).   Left Eye Quality was good. Central Foveal Thickness: 248. Progression has been stable. Findings include normal foveal contour, no SRF, epiretinal membrane, macular pucker, vitreous traction, preretinal fibrosis (Trace cystic changes temporal macula, pre retinal fibrosis overlying disc extending to macula w/ mild traction -- stable).   Notes *Images captured and stored on drive  Diagnosis / Impression:  PDR w/ DME OU OD: Mild interval improvement in IRF/IRHM IT macula; +PRF overlying disc and inferior macula OS: Mild cystic changes inferior macula, pre retinal fibrosis overlying disc extending to macula w/ mild traction -- stable  Clinical management:  See below  Abbreviations: NFP - Normal foveal profile. CME - cystoid macular edema. PED - pigment epithelial detachment. IRF - intraretinal fluid. SRF - subretinal fluid. EZ - ellipsoid zone. ERM - epiretinal membrane. ORA - outer retinal atrophy. ORT - outer retinal tubulation. SRHM - subretinal hyper-reflective material. IRHM - intraretinal hyper-reflective material      Intravitreal Injection, Pharmacologic Agent - OD - Right Eye       Time Out 05/12/2024. 10:10 AM. Confirmed correct patient, procedure, site, and patient consented.   Anesthesia Topical anesthesia was used. Anesthetic medications included Lidocaine  2%, Proparacaine 0.5%.   Procedure Preparation included 5% betadine to ocular surface, eyelid speculum. A (32g) needle was used.    Injection: 2 mg aflibercept  2 MG/0.05ML   Route: Intravitreal, Site: Right Eye   NDC: D2246706, Lot: [id number], Expiration date: 06/22/2025, Waste: 0 mL   Post-op Post injection exam found visual acuity of at least counting fingers. The patient tolerated the procedure well. There were no complications. The patient received written and verbal post procedure care education.      Intravitreal Injection, Pharmacologic Agent - OS - Left Eye       Time Out 05/12/2024. 10:11 AM. Confirmed correct patient, procedure, site, and patient consented.   Anesthesia Topical anesthesia was used. Anesthetic medications included Lidocaine  2%, Proparacaine 0.5%.   Procedure Preparation included 5% betadine to ocular surface, eyelid speculum. A (32g) needle was used.   Injection: 2 mg aflibercept  2 MG/0.05ML   Route: Intravitreal, Site: Left Eye   NDC: D2246706, Lot: [id number], Expiration date: 05/22/2025, Waste: 0 mL   Post-op Post injection exam found visual acuity of at least counting fingers. The patient tolerated the procedure well. There were no complications. The patient received written and verbal post procedure care education. Post injection medications were not given.             ASSESSMENT/PLAN:    ICD-10-CM   1. 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 Current use of insulin  (HCC)  Z79.4     3. Epiretinal membrane (ERM) of both eyes  H35.373     4. Essential hypertension  I10     5. Hypertensive retinopathy of both eyes  H35.033     6. High-risk pregnancy in second trimester  O09.92       1-3. 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, both  eyes - last A1c 10.1 (01.31.25),  10.7 (10.30.24), 13.9 (08.30.24), 10.9 (02.16.24), 14.5 (10.10.23), 7.4 (06.06.23) - delayed to follow up from 4 weeks to 9 weeks (02.05.24 to 04.09.24)  - s/p PRP OD (03.27.23), fill in (05.15.23)  - s/p PRP OS (04.17.23), fill in (10.20.23) - s/p IVA OS #1 (12.08.23), #2 (01.08.24), #3 (04.09.24), #4 (05.21.24), #5 (07.01.24), #6 (8.12.24), #7 (09.27.24), #8 (11.19.24), #9 (12.31.24),  #10 (02.11.25) - s/p IVA OD #1 (02.05.24), #2 (04.09.24), #3 (05.21.24), #4 (07.01.24), #5 (08.12.24), #6 (09.27.24), #7 (11.19.24), #8 (12.31.24), #9 (02.11.25) - IVA resistance ====================== - s/p IVE OU #1  (03.28.25), #2 (05.09.25) - BCVA OD 20/25 OU - FA 10.20.23 shows 360 midzonal NVE w/ leakage OU - OCT shows OD: Mild interval improvement in IRF/IRHM IT macula; +PRF overlying disc and inferior macula; OS: Mild cystic changes inferior macula, pre retinal fibrosis overlying disc extending to macula w/ mild traction -- stable at 6 weeks - recommend IVE OU #3 today, 06.20.25 with follow up in 6 weeks - pt wishes to proceed with injections - RBA of procedure discussed, questions answered - IVE informed consent obtained and signed, 03.28.25 (OU) - see procedure note - approved for Eylea  for 2025 - f/u 6 weeks -- DFE/OCT, possible injections  4-6. History of high risk pregnancy w/ +HTN and hypertensive retinopathy - s/p Cesarean 06.24.23 (baby boy) - 33 weeks and 1 day, 3lbs, now out of the NICU and at home - history of pre-eclampsia - BP improved since delivery - discussed importance of tight BP control  - continue to monitor  Ophthalmic Meds Ordered this visit:  Meds ordered this encounter  Medications   aflibercept  (EYLEA ) SOLN 2 mg   aflibercept  (EYLEA ) SOLN 2 mg     Return in about 6 weeks (around 06/23/2024) for f/u PDR OU, DFE, OCT, Possible Injxn.  There are no Patient Instructions on file for this visit.  This document serves as a record of  services personally performed by Jeanice Millard, MD, PhD. It was created on their behalf by Angelia Kelp, an ophthalmic technician. The creation of this record is the provider's dictation and/or activities during the visit.    Electronically signed by: Angelia Kelp, OA, 05/12/24  5:07 PM  This document serves as a record of services personally performed by Jeanice Millard, MD, PhD. It was created on their behalf by Morley Arabia. Bevin Bucks, OA an ophthalmic technician. The creation of this record is the provider's dictation and/or activities during the visit.    Electronically signed by: Morley Arabia. Bevin Bucks, OA 05/12/24 5:07 PM  Jeanice Millard, M.D., Ph.D. Diseases & Surgery of the Retina and Vitreous [facility] 05/12/2024   I have reviewed the above documentation for accuracy and completeness, and I agree with the above. Jeanice Millard, M.D., Ph.D. 05/12/24 5:07 PM   Abbreviations: M myopia (nearsighted); A astigmatism; H hyperopia (farsighted); P presbyopia; Mrx spectacle prescription;  CTL contact lenses; OD right eye; OS left eye; OU both eyes  XT exotropia; ET esotropia; PEK punctate epithelial keratitis; PEE punctate epithelial erosions; DES dry eye syndrome; MGD meibomian gland dysfunction; ATs artificial tears; PFAT's preservative free artificial tears; NSC nuclear sclerotic cataract; PSC posterior subcapsular cataract; ERM epi-retinal membrane; PVD posterior vitreous detachment; RD retinal detachment; DM diabetes mellitus; DR diabetic retinopathy; NPDR non-proliferative diabetic retinopathy; PDR proliferative diabetic retinopathy; CSME clinically significant macular edema; DME diabetic macular edema; dbh dot blot hemorrhages; CWS cotton wool spot; POAG primary open angle glaucoma; C/D cup-to-disc ratio; HVF humphrey  visual field; GVF goldmann visual field; OCT optical coherence tomography; IOP intraocular pressure; BRVO Branch retinal vein occlusion; CRVO central  retinal vein occlusion; CRAO central retinal artery occlusion; BRAO branch retinal artery occlusion; RT retinal tear; SB scleral buckle; PPV pars plana vitrectomy; VH Vitreous hemorrhage; PRP panretinal laser photocoagulation; IVK intravitreal kenalog ; VMT vitreomacular traction; MH Macular hole;  NVD neovascularization of the disc; NVE neovascularization elsewhere; AREDS age related eye disease study; ARMD age related macular degeneration; POAG primary open angle glaucoma; EBMD epithelial/anterior basement membrane dystrophy; ACIOL anterior chamber intraocular lens; IOL intraocular lens; PCIOL posterior chamber intraocular lens; Phaco/IOL phacoemulsification with intraocular lens placement; PRK photorefractive keratectomy; LASIK laser assisted in situ keratomileusis; HTN hypertension; DM diabetes mellitus; COPD chronic obstructive pulmonary disease

## 2024-05-12 ENCOUNTER — Ambulatory Visit (INDEPENDENT_AMBULATORY_CARE_PROVIDER_SITE_OTHER): Admitting: Ophthalmology

## 2024-05-12 ENCOUNTER — Encounter (INDEPENDENT_AMBULATORY_CARE_PROVIDER_SITE_OTHER): Payer: Self-pay | Admitting: Ophthalmology

## 2024-05-12 DIAGNOSIS — Z794 Long term (current) use of insulin: Secondary | ICD-10-CM

## 2024-05-12 DIAGNOSIS — E103513 Type 1 diabetes mellitus with proliferative diabetic retinopathy with macular edema, bilateral: Secondary | ICD-10-CM

## 2024-05-12 DIAGNOSIS — I10 Essential (primary) hypertension: Secondary | ICD-10-CM | POA: Diagnosis not present

## 2024-05-12 DIAGNOSIS — H35373 Puckering of macula, bilateral: Secondary | ICD-10-CM | POA: Diagnosis not present

## 2024-05-12 DIAGNOSIS — H35033 Hypertensive retinopathy, bilateral: Secondary | ICD-10-CM | POA: Diagnosis not present

## 2024-05-12 DIAGNOSIS — O0992 Supervision of high risk pregnancy, unspecified, second trimester: Secondary | ICD-10-CM

## 2024-05-12 MED ORDER — AFLIBERCEPT 2MG/0.05ML IZ SOLN FOR KALEIDOSCOPE
2.0000 mg | INTRAVITREAL | Status: AC | PRN
  Administered 2024-05-12: 2 mg via INTRAVITREAL

## 2024-06-20 NOTE — Progress Notes (Signed)
 [facility] - Clinic Note  06/22/2024     CHIEF COMPLAINT Patient presents for No chief complaint on file.   HISTORY OF PRESENT ILLNESS: Kristin Clay is a 38 y.o. female who presents to the clinic today for:      Patient states her vision is good  Referring physician: Jacques Sharper MD [address] [place], [place] [address]  HISTORICAL INFORMATION:   Selected notes from the MEDICAL RECORD NUMBER Referred by Dr. Jacques for diabetic retinal evaluation LEE:  Ocular Hx- PMH-pregnancy, DM    CURRENT MEDICATIONS: No current outpatient medications on file. (Ophthalmic Drugs)   No current facility-administered medications for this visit. (Ophthalmic Drugs)   Current Outpatient Medications (Other)  Medication Sig   aspirin  81 MG chewable tablet Chew by mouth.   Continuous Glucose Sensor (DEXCOM G7 SENSOR) MISC 1 Device by Other route as directed. Change sensors once every 10 days. (Patient not taking: Reported on 05/12/2024)   fluconazole  (DIFLUCAN ) 150 MG tablet Take 150 mg by mouth once. (Patient not taking: Reported on 05/12/2024)   insulin  aspart (NOVOLOG ) 100 UNIT/ML injection Max daily 80 units   Insulin  Disposable Pump (OMNIPOD 5 G7 PODS, GEN 5,) MISC 1 each by Does not apply route every 3 (three) days.   labetalol  (NORMODYNE ) 300 MG tablet Take 1 tablet (300 mg total) by mouth 2 (two) times daily.   NIFEdipine  (ADALAT  CC) 60 MG 24 hr tablet TAKE 1 TABLET BY MOUTH 2 TIMES DAILY.   Prenatal Vit-Fe Fumarate-FA (PREPLUS) 27-1 MG TABS Take 1 tablet by mouth daily.   No current facility-administered medications for this visit. (Other)   REVIEW OF SYSTEMS:     ALLERGIES Allergies  Allergen Reactions   Pork-Derived Products    PAST MEDICAL HISTORY Past Medical History:  Diagnosis Date   Chronic hypertension    Complication of anesthesia    with hand surgery, local anes did not work, tried twice- had to put her to  sleep   Diabetic retinopathy (HCC)    DKA (diabetic ketoacidosis) (HCC) 10/2020   Epidural hematoma (HCC) 07/04/2021   Hypertension in pregnancy, preeclampsia, severe, delivered 12/15/2021   Obstetric vaginal laceration with type 3c third degree perineal laceration 06/29/2021   Pyelonephritis 10/2020   Rh negative status during pregnancy 05/01/2021   s/p Rhogam 05/13/21   Type I diabetes mellitus (HCC)    dx 20yrs ago   UTI (urinary tract infection)    Past Surgical History:  Procedure Laterality Date   CESAREAN SECTION  05/16/2022   Procedure: CESAREAN SECTION;  Surgeon: Zina Jerilynn LABOR, MD;  Location: [facility];  Service: Obstetrics;;   female circumcision Bilateral    clitorectomy as well   FOOT SURGERY  2018   HAND SURGERY  2019   FAMILY HISTORY Family History  Problem Relation Age of Onset   Hyperlipidemia Mother    Hypertension Mother    Kidney disease Father    Alzheimer's disease Father    SOCIAL HISTORY Social History   Tobacco Use   Smoking status: Never   Smokeless tobacco: Never  Vaping Use   Vaping status: Never Used  Substance Use Topics   Alcohol use: Never   Drug use: Never       OPHTHALMIC EXAM:  Not recorded    IMAGING AND PROCEDURES  Imaging and Procedures for 06/22/2024           ASSESSMENT/PLAN:    ICD-10-CM   1. Proliferative  diabetic retinopathy of both eyes with macular edema associated with type 1 diabetes mellitus (HCC)  Z89.6486     2. Current use of insulin  (HCC)  Z79.4     3. Epiretinal membrane (ERM) of both eyes  H35.373     4. Essential hypertension  I10     5. Hypertensive retinopathy of both eyes  H35.033     6. High-risk pregnancy in second trimester  O09.92       1-3. 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, both eyes - last A1c 10.1 (01.31.25), 10.7 (10.30.24), 13.9 (08.30.24), 10.9 (02.16.24), 14.5 (10.10.23), 7.4 (06.06.23) - delayed to follow up from 4 weeks to 9 weeks (02.05.24 to  04.09.24)  - s/p PRP OD (03.27.23), fill in (05.15.23)  - s/p PRP OS (04.17.23), fill in (10.20.23) - s/p IVA OS #1 (12.08.23), #2 (01.08.24), #3 (04.09.24), #4 (05.21.24), #5 (07.01.24), #6 (8.12.24), #7 (09.27.24), #8 (11.19.24), #9 (12.31.24),  #10 (02.11.25) - s/p IVA OD #1 (02.05.24), #2 (04.09.24), #3 (05.21.24), #4 (07.01.24), #5 (08.12.24), #6 (09.27.24), #7 (11.19.24), #8 (12.31.24), #9 (02.11.25) - IVA resistance ====================== - s/p IVE OU #1  (03.28.25), #2 (05.09.25), #3 (06.20.25) - BCVA OD 20/25 OU - FA 10.20.23 shows 360 midzonal NVE w/ leakage OU - OCT shows OD: Mild interval improvement in IRF/IRHM IT macula; +PRF overlying disc and inferior macula; OS: Mild cystic changes inferior macula, pre retinal fibrosis overlying disc extending to macula w/ mild traction -- stable at 6 weeks - recommend IVE OU #4 today, 07.31.25 with follow up in 6 weeks - pt wishes to proceed with injections - RBA of procedure discussed, questions answered - IVE informed consent obtained and signed, 03.28.25 (OU) - see procedure note - approved for Eylea  for 2025 - f/u 6 weeks -- DFE/OCT, possible injections  4-6. History of high risk pregnancy w/ +HTN and hypertensive retinopathy - s/p Cesarean 06.24.23 (baby boy) - 33 weeks and 1 day, 3lbs, now out of the NICU and at home - history of pre-eclampsia - BP improved since delivery - discussed importance of tight BP control  - continue to monitor  Ophthalmic Meds Ordered this visit:  No orders of the defined types were placed in this encounter.    No follow-ups on file.  There are no Patient Instructions on file for this visit.  This document serves as a record of services personally performed by Redell JUDITHANN Hans, MD, PhD. It was created on their behalf by Alan PARAS. Delores, OA an ophthalmic technician. The creation of this record is the provider's dictation and/or activities during the visit.    Electronically signed by: Alan PARAS.  Delores BIHARI 06/20/24 12:38 PM   Redell JUDITHANN Hans, M.D., Ph.D. Diseases & Surgery of the Retina and Vitreous [facility] 06/22/2024     Abbreviations: M myopia (nearsighted); A astigmatism; H hyperopia (farsighted); P presbyopia; Mrx spectacle prescription;  CTL contact lenses; OD right eye; OS left eye; OU both eyes  XT exotropia; ET esotropia; PEK punctate epithelial keratitis; PEE punctate epithelial erosions; DES dry eye syndrome; MGD meibomian gland dysfunction; ATs artificial tears; PFAT's preservative free artificial tears; NSC nuclear sclerotic cataract; PSC posterior subcapsular cataract; ERM epi-retinal membrane; PVD posterior vitreous detachment; RD retinal detachment; DM diabetes mellitus; DR diabetic retinopathy; NPDR non-proliferative diabetic retinopathy; PDR proliferative diabetic retinopathy; CSME clinically significant macular edema; DME diabetic macular edema; dbh dot blot hemorrhages; CWS cotton wool spot; POAG primary open angle glaucoma; C/D cup-to-disc ratio; HVF humphrey visual  field; GVF goldmann visual field; OCT optical coherence tomography; IOP intraocular pressure; BRVO Branch retinal vein occlusion; CRVO central retinal vein occlusion; CRAO central retinal artery occlusion; BRAO branch retinal artery occlusion; RT retinal tear; SB scleral buckle; PPV pars plana vitrectomy; VH Vitreous hemorrhage; PRP panretinal laser photocoagulation; IVK intravitreal kenalog ; VMT vitreomacular traction; MH Macular hole;  NVD neovascularization of the disc; NVE neovascularization elsewhere; AREDS age related eye disease study; ARMD age related macular degeneration; POAG primary open angle glaucoma; EBMD epithelial/anterior basement membrane dystrophy; ACIOL anterior chamber intraocular lens; IOL intraocular lens; PCIOL posterior chamber intraocular lens; Phaco/IOL phacoemulsification with intraocular lens placement; PRK photorefractive keratectomy; LASIK laser assisted in  situ keratomileusis; HTN hypertension; DM diabetes mellitus; COPD chronic obstructive pulmonary disease

## 2024-06-22 ENCOUNTER — Ambulatory Visit (INDEPENDENT_AMBULATORY_CARE_PROVIDER_SITE_OTHER): Admitting: Ophthalmology

## 2024-06-22 ENCOUNTER — Encounter (INDEPENDENT_AMBULATORY_CARE_PROVIDER_SITE_OTHER): Payer: Self-pay | Admitting: Ophthalmology

## 2024-06-22 DIAGNOSIS — Z794 Long term (current) use of insulin: Secondary | ICD-10-CM | POA: Diagnosis not present

## 2024-06-22 DIAGNOSIS — H35033 Hypertensive retinopathy, bilateral: Secondary | ICD-10-CM | POA: Diagnosis not present

## 2024-06-22 DIAGNOSIS — O0992 Supervision of high risk pregnancy, unspecified, second trimester: Secondary | ICD-10-CM

## 2024-06-22 DIAGNOSIS — H35373 Puckering of macula, bilateral: Secondary | ICD-10-CM | POA: Diagnosis not present

## 2024-06-22 DIAGNOSIS — E103513 Type 1 diabetes mellitus with proliferative diabetic retinopathy with macular edema, bilateral: Secondary | ICD-10-CM

## 2024-06-22 DIAGNOSIS — I10 Essential (primary) hypertension: Secondary | ICD-10-CM | POA: Diagnosis not present

## 2024-06-22 MED ORDER — AFLIBERCEPT 2MG/0.05ML IZ SOLN FOR KALEIDOSCOPE
2.0000 mg | INTRAVITREAL | Status: AC | PRN
  Administered 2024-06-22: 2 mg via INTRAVITREAL

## 2024-07-06 NOTE — Progress Notes (Shared)
 [facility] - Clinic Note  07/20/2024     CHIEF COMPLAINT Patient presents for No chief complaint on file.   HISTORY OF PRESENT ILLNESS: Kristin Clay OASIS Clay is a 38 y.o. female who presents to the clinic today for:    Patient states   Referring physician: Jacques Sharper MD [address], [address] [place], [place] [address]  HISTORICAL INFORMATION:   Selected notes from the MEDICAL RECORD NUMBER Referred by Dr. Jacques for diabetic retinal evaluation LEE:  Ocular Hx- PMH-pregnancy, DM    CURRENT MEDICATIONS: No current outpatient medications on file. (Ophthalmic Drugs)   No current facility-administered medications for this visit. (Ophthalmic Drugs)   Current Outpatient Medications (Other)  Medication Sig   aspirin  81 MG chewable tablet Chew by mouth.   Continuous Glucose Sensor (DEXCOM G7 SENSOR) MISC 1 Device by Other route as directed. Change sensors once every 10 days. (Patient not taking: Reported on 06/22/2024)   fluconazole  (DIFLUCAN ) 150 MG tablet Take 150 mg by mouth once. (Patient not taking: Reported on 06/22/2024)   insulin  aspart (NOVOLOG ) 100 UNIT/ML injection Max daily 80 units (Patient not taking: Reported on 06/22/2024)   Insulin  Disposable Pump (OMNIPOD 5 G7 PODS, GEN 5,) MISC 1 each by Does not apply route every 3 (three) days.   labetalol  (NORMODYNE ) 300 MG tablet Take 1 tablet (300 mg total) by mouth 2 (two) times daily.   NIFEdipine  (ADALAT  CC) 60 MG 24 hr tablet TAKE 1 TABLET BY MOUTH 2 TIMES DAILY.   Prenatal Vit-Fe Fumarate-FA (PREPLUS) 27-1 MG TABS Take 1 tablet by mouth daily.   No current facility-administered medications for this visit. (Other)   REVIEW OF SYSTEMS:   ALLERGIES Allergies  Allergen Reactions   Pork-Derived Products    PAST MEDICAL HISTORY Past Medical History:  Diagnosis Date   Chronic hypertension    Complication of anesthesia    with hand surgery, local anes did not work, tried twice- had to  put her to sleep   Diabetic retinopathy (HCC)    DKA (diabetic ketoacidosis) (HCC) 10/2020   Epidural hematoma (HCC) 07/04/2021   Hypertension in pregnancy, preeclampsia, severe, delivered 12/15/2021   Obstetric vaginal laceration with type 3c third degree perineal laceration 06/29/2021   Pyelonephritis 10/2020   Rh negative status during pregnancy 05/01/2021   s/p Rhogam 05/13/21   Type I diabetes mellitus (HCC)    dx 80yrs ago   UTI (urinary tract infection)    Past Surgical History:  Procedure Laterality Date   CESAREAN SECTION  05/16/2022   Procedure: CESAREAN SECTION;  Surgeon: Zina Jerilynn LABOR, MD;  Location: [facility];  Service: Obstetrics;;   female circumcision Bilateral    clitorectomy as well   FOOT SURGERY  2018   HAND SURGERY  2019   FAMILY HISTORY Family History  Problem Relation Age of Onset   Hyperlipidemia Mother    Hypertension Mother    Kidney disease Father    Alzheimer's disease Father    SOCIAL HISTORY Social History   Tobacco Use   Smoking status: Never   Smokeless tobacco: Never  Vaping Use   Vaping status: Never Used  Substance Use Topics   Alcohol use: Never   Drug use: Never       OPHTHALMIC EXAM:  Not recorded    IMAGING AND PROCEDURES  Imaging and Procedures for 07/20/2024          ASSESSMENT/PLAN:    ICD-10-CM   1. Proliferative diabetic retinopathy  of both eyes with macular edema associated with type 1 diabetes mellitus (HCC)  Z89.6486     2. Current use of insulin  (HCC)  Z79.4     3. Epiretinal membrane (ERM) of both eyes  H35.373     4. Essential hypertension  I10     5. Hypertensive retinopathy of both eyes  H35.033     6. High-risk pregnancy in second trimester  O09.92       1-3. 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, both eyes - last A1c 10.1 (01.31.25), 10.7 (10.30.24), 13.9 (08.30.24), 10.9 (02.16.24), 14.5 (10.10.23), 7.4 (06.06.23) - delayed to follow up from 4 weeks to 9 weeks (02.05.24 to  04.09.24)  - s/p PRP OD (03.27.23), fill in (05.15.23)  - s/p PRP OS (04.17.23), fill in (10.20.23) - s/p IVA OS #1 (12.08.23), #2 (01.08.24), #3 (04.09.24), #4 (05.21.24), #5 (07.01.24), #6 (8.12.24), #7 (09.27.24), #8 (11.19.24), #9 (12.31.24),  #10 (02.11.25) - s/p IVA OD #1 (02.05.24), #2 (04.09.24), #3 (05.21.24), #4 (07.01.24), #5 (08.12.24), #6 (09.27.24), #7 (11.19.24), #8 (12.31.24), #9 (02.11.25) - IVA resistance ====================== - s/p IVE OU #1 (03.28.25), #2 (05.09.25), #3 (06.20.25) - s/p IVE OD #4 (07.31.25) - BCVA OD decreased to 20/25 from 20/20, OS stable at 20/20 - FA 10.20.23 shows 360 midzonal NVE w/ leakage OU - OCT shows OD: Mild interval increase in IRF/IRHM IT macula; +PRF overlying disc and inferior macula; OS: Mild cystic changes inferior macula -- stably improved, pre retinal fibrosis overlying disc extending to macula w/ mild traction -- stable at 6 weeks - recommend IVE today OD #5 (08.28.25) with follow up back to 4-5  weeks - will hold OS today -- pt in agreement - pt wishes to proceed with injection - RBA of procedure discussed, questions answered - IVE informed consent obtained and signed, 03.28.25 (OU) - see procedure note - approved for Eylea  for 2025 - f/u 4-5 weeks -- DFE/OCT, possible injection(s)  4-6. History of high risk pregnancy w/ +HTN and hypertensive retinopathy - s/p Cesarean 06.24.23 (baby boy) - 33 weeks and 1 day, 3lbs, now out of the NICU and at home - history of pre-eclampsia - BP improved since delivery - discussed importance of tight BP control  - continue to monitor  Ophthalmic Meds Ordered this visit:  No orders of the defined types were placed in this encounter.    No follow-ups on file.  There are no Patient Instructions on file for this visit.  This document serves as a record of services personally performed by Redell JUDITHANN Hans, MD, PhD. It was created on their behalf by Avelina Pereyra, COA an ophthalmic technician.  The creation of this record is the provider's dictation and/or activities during the visit.   Electronically signed by: Avelina GORMAN Pereyra, COT  07/06/24  3:21 PM     Redell JUDITHANN Hans, M.D., Ph.D. Diseases & Surgery of the Retina and Vitreous [facility]    Abbreviations: M myopia (nearsighted); A astigmatism; H hyperopia (farsighted); P presbyopia; Mrx spectacle prescription;  CTL contact lenses; OD right eye; OS left eye; OU both eyes  XT exotropia; ET esotropia; PEK punctate epithelial keratitis; PEE punctate epithelial erosions; DES dry eye syndrome; MGD meibomian gland dysfunction; ATs artificial tears; PFAT's preservative free artificial tears; NSC nuclear sclerotic cataract; PSC posterior subcapsular cataract; ERM epi-retinal membrane; PVD posterior vitreous detachment; RD retinal detachment; DM diabetes mellitus; DR diabetic retinopathy; NPDR non-proliferative diabetic retinopathy; PDR proliferative diabetic retinopathy; CSME clinically significant macular  edema; DME diabetic macular edema; dbh dot blot hemorrhages; CWS cotton wool spot; POAG primary open angle glaucoma; C/D cup-to-disc ratio; HVF humphrey visual field; GVF goldmann visual field; OCT optical coherence tomography; IOP intraocular pressure; BRVO Branch retinal vein occlusion; CRVO central retinal vein occlusion; CRAO central retinal artery occlusion; BRAO branch retinal artery occlusion; RT retinal tear; SB scleral buckle; PPV pars plana vitrectomy; VH Vitreous hemorrhage; PRP panretinal laser photocoagulation; IVK intravitreal kenalog ; VMT vitreomacular traction; MH Macular hole;  NVD neovascularization of the disc; NVE neovascularization elsewhere; AREDS age related eye disease study; ARMD age related macular degeneration; POAG primary open angle glaucoma; EBMD epithelial/anterior basement membrane dystrophy; ACIOL anterior chamber intraocular lens; IOL intraocular lens; PCIOL posterior chamber intraocular  lens; Phaco/IOL phacoemulsification with intraocular lens placement; PRK photorefractive keratectomy; LASIK laser assisted in situ keratomileusis; HTN hypertension; DM diabetes mellitus; COPD chronic obstructive pulmonary disease

## 2024-07-20 ENCOUNTER — Encounter (INDEPENDENT_AMBULATORY_CARE_PROVIDER_SITE_OTHER): Admitting: Ophthalmology

## 2024-07-20 DIAGNOSIS — H35373 Puckering of macula, bilateral: Secondary | ICD-10-CM

## 2024-07-20 DIAGNOSIS — H35033 Hypertensive retinopathy, bilateral: Secondary | ICD-10-CM

## 2024-07-20 DIAGNOSIS — Z794 Long term (current) use of insulin: Secondary | ICD-10-CM

## 2024-07-20 DIAGNOSIS — O0992 Supervision of high risk pregnancy, unspecified, second trimester: Secondary | ICD-10-CM

## 2024-07-20 DIAGNOSIS — E103513 Type 1 diabetes mellitus with proliferative diabetic retinopathy with macular edema, bilateral: Secondary | ICD-10-CM

## 2024-07-20 DIAGNOSIS — I10 Essential (primary) hypertension: Secondary | ICD-10-CM

## 2024-07-21 ENCOUNTER — Ambulatory Visit (INDEPENDENT_AMBULATORY_CARE_PROVIDER_SITE_OTHER): Admitting: Internal Medicine

## 2024-07-21 ENCOUNTER — Encounter: Payer: Self-pay | Admitting: Internal Medicine

## 2024-07-21 VITALS — BP 114/70 | HR 78 | Ht 62.0 in | Wt 124.0 lb

## 2024-07-21 DIAGNOSIS — E1042 Type 1 diabetes mellitus with diabetic polyneuropathy: Secondary | ICD-10-CM | POA: Diagnosis not present

## 2024-07-21 DIAGNOSIS — E1065 Type 1 diabetes mellitus with hyperglycemia: Secondary | ICD-10-CM | POA: Diagnosis not present

## 2024-07-21 DIAGNOSIS — N926 Irregular menstruation, unspecified: Secondary | ICD-10-CM

## 2024-07-21 DIAGNOSIS — E103592 Type 1 diabetes mellitus with proliferative diabetic retinopathy without macular edema, left eye: Secondary | ICD-10-CM

## 2024-07-21 DIAGNOSIS — E0590 Thyrotoxicosis, unspecified without thyrotoxic crisis or storm: Secondary | ICD-10-CM

## 2024-07-21 LAB — BASIC METABOLIC PANEL WITH GFR
BUN: 18 mg/dL (ref 7–25)
CO2: 24 mmol/L (ref 20–32)
Calcium: 9.4 mg/dL (ref 8.6–10.2)
Chloride: 101 mmol/L (ref 98–110)
Creat: 0.51 mg/dL (ref 0.50–0.97)
Glucose, Bld: 330 mg/dL — ABNORMAL HIGH (ref 65–99)
Potassium: 4.8 mmol/L (ref 3.5–5.3)
Sodium: 134 mmol/L — ABNORMAL LOW (ref 135–146)
eGFR: 122 mL/min/1.73m2 (ref >=60)

## 2024-07-21 LAB — LIPID PANEL
Cholesterol: 188 mg/dL (ref <200)
HDL: 42 mg/dL — ABNORMAL LOW (ref >=50)
LDL Cholesterol (Calc): 128 mg/dL — ABNORMAL HIGH
Non-HDL Cholesterol (Calc): 146 mg/dL — ABNORMAL HIGH (ref <130)
Total CHOL/HDL Ratio: 4.5 (calc) (ref <5.0)
Triglycerides: 81 mg/dL (ref <150)

## 2024-07-21 LAB — POCT GLYCOSYLATED HEMOGLOBIN (HGB A1C): Hemoglobin A1C: 11.8 % — AB (ref 4.0–5.6)

## 2024-07-21 LAB — FOLLICLE STIMULATING HORMONE: FSH: 5.1 m[IU]/mL

## 2024-07-21 LAB — ESTRADIOL: Estradiol: 166 pg/mL

## 2024-07-21 LAB — LUTEINIZING HORMONE: LH: 4.3 m[IU]/mL

## 2024-07-21 LAB — PROLACTIN: Prolactin: 4.1 ng/mL

## 2024-07-21 LAB — TSH: TSH: 0.37 m[IU]/L — ABNORMAL LOW

## 2024-07-21 MED ORDER — INSULIN GLARGINE 100 UNIT/ML SOLOSTAR PEN: 32.0000 [IU] | PEN_INJECTOR | Freq: Every day | SUBCUTANEOUS | 11 refills | Status: AC

## 2024-07-21 MED ORDER — NOVOLOG FLEXPEN 100 UNIT/ML ~~LOC~~ SOPN: 12.0000 [IU] | PEN_INJECTOR | Freq: Three times a day (TID) | SUBCUTANEOUS | 6 refills | Status: AC

## 2024-07-21 MED ORDER — INSULIN PEN NEEDLE 32G X 4 MM MISC: 1.0000 | Freq: Four times a day (QID) | 3 refills | Status: AC

## 2024-07-21 MED ORDER — DEXCOM G7 SENSOR MISC: 1.0000 | 6 refills | Status: AC

## 2024-07-21 NOTE — Progress Notes (Signed)
 Name: Kristin Clay  MRN/ DOB: [id number], 1986/11/18   Age/ Sex: 38 y.o., female    PCP: Tanda Bleacher, MD   Reason for Endocrinology Evaluation: Type 1 Diabetes Mellitus     Date of Initial Endocrinology Visit: 03/18/2022    PATIENT IDENTIFIER: Kristin Clay is a 38 y.o. female with a past medical history of T1 DM. The patient presented for initial endocrinology clinic visit on 03/18/2022 for consultative assistance with her diabetes management.    HPI: [name] was    Diagnosed with T1DM at age 61          Hemoglobin A1c has ranged from 8.4% in 2023, peaking at 13.0% in 2020. Patient has required hospitalization within the last 1 year from hyper or hypoglycemia: Yes 02/2022 with DKA  She is currently 24.5 weeks of gestation with a boy  She has 67 months old baby   Paternal aunts with T1 diabetes   Pt with tingling of hands and feet, she has partial ring finger amputations B/L    S/P delivery 6/27/2023[facility]  She was off the insulin  pump 03/2023 until 10/2023 when it was resumed again   SUBJECTIVE:   During the last visit (04/20/2024): A1c 11.0%    Today (07/21/24): Kristin Clay is here for a follow up on diabetes management.  She checks glucose multiple times daily through Dexcom.  She continues to follow-up with Dr. Zamora for proliferative diabetic retinopathy, she continues to receive intra vitreal injections   The patient states she has not had insulin  over the past 2 weeks, as well as no OmniPod's.  Per patient and will inform the pharmacy, that our prescriptions are not written correctly The patient states she has not been prescribed enough insulin  vials.  I did explain to the patient that I did write for 8 vials, per patient and the pharmacy only gives her 4 vials.   Denies constipation or diarrhea  Has nausea Patient is complaining of increased abdominal enlargement but no abdominal pain Has noted irregular  mensuration over the past 3 months, LMP 3 days ago  She is not nursing   This patient with type 1 diabetes is treated with Omnipod (insulin  pump). During the visit the pump basal and bolus doses were reviewed including carb/insulin  rations and supplemental doses. The clinical list was updated. The glucose meter download was reviewed in detail to determine if the current pump settings are providing the best glycemic control without excessive hypoglycemia.  Pump and meter download:     Pump   Omnipod Settings   Insulin  type   Novolog    Basal rate       0000  1.1 u/h               I:C ratio       0000 1:1    Enter #14g TID, 6 g with small meal and 2 g with snacks              Sensitivity       0000  40      Goal       0000  120          Type & Model of Pump: omnipod Insulin  Type: Currently using Novolog .    PUMP STATISTICS:     HOME DIABETES REGIMEN: Novolog      CONTINUOUS GLUCOSE MONITORING RECORD INTERPRETATION    Dates of Recording: 8/8-8/21/2025  Sensor description:dexcom  Results statistics:   CGM  use % of time 94  Average and SD 304/92  Time in range 15%  % Time Above 180 13  % Time above 250 72  % Time Below target 0   Glycemic patterns summary: Hyperglycemia noted throughout the day and night Hyperglycemic episodes throughout the day and night  Hypoglycemic episodes occurred N/A  Overnight periods: High   DIABETIC COMPLICATIONS: Microvascular complications:  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 Denies: CKD  Last eye exam: Completed 11/23/2023  Macrovascular complications:   Denies: CAD, PVD, CVA   PAST HISTORY: Past Medical History:  Past Medical History:  Diagnosis Date   Chronic hypertension    Complication of anesthesia    with hand surgery, local anes did not work, tried twice- had to put her to sleep   Diabetic retinopathy (HCC)    DKA (diabetic ketoacidosis) (HCC) 10/2020   Epidural hematoma (HCC) 07/04/2021    Hypertension in pregnancy, preeclampsia, severe, delivered 12/15/2021   Obstetric vaginal laceration with type 3c third degree perineal laceration 06/29/2021   Pyelonephritis 10/2020   Rh negative status during pregnancy 05/01/2021   s/p Rhogam 05/13/21   Type I diabetes mellitus (HCC)    dx 61yrs ago   UTI (urinary tract infection)    Past Surgical History:  Past Surgical History:  Procedure Laterality Date   CESAREAN SECTION  05/16/2022   Procedure: CESAREAN SECTION;  Surgeon: Zina Jerilynn LABOR, MD;  Location: [facility];  Service: Obstetrics;;   female circumcision Bilateral    clitorectomy as well   FOOT SURGERY  2018   HAND SURGERY  2019    Social History:  reports that she has never smoked. She has never used smokeless tobacco. She reports that she does not drink alcohol and does not use drugs. Family History:  Family History  Problem Relation Age of Onset   Hyperlipidemia Mother    Hypertension Mother    Kidney disease Father    Alzheimer's disease Father      HOME MEDICATIONS: Allergies as of 07/21/2024       Reactions   Pork-derived Products         Medication List        Accurate as of July 21, 2024 12:17 PM. If you have any questions, ask your nurse or doctor.          STOP taking these medications    insulin  aspart 100 UNIT/ML injection Commonly known as: NovoLOG  Replaced by: NovoLOG  FlexPen 100 UNIT/ML FlexPen Stopped by: Lenford Beddow J Derhonda Eastlick       TAKE these medications    aspirin  81 MG chewable tablet Chew by mouth.   Dexcom G7 Sensor Misc 1 Device by Other route as directed. Change sensors once every 10 days.   fluconazole  150 MG tablet Commonly known as: DIFLUCAN  Take 150 mg by mouth once.   insulin  glargine 100 UNIT/ML Solostar Pen Commonly known as: LANTUS  Inject 32 Units into the skin daily. Started by: Quinne Pires J Yajaira Doffing   Insulin  Pen Needle 32G X 4 MM Misc 1 Device by Does not apply route in the morning, at noon, in the  evening, and at bedtime. Started by: Namiko Pritts J Leida Luton   labetalol  300 MG tablet Commonly known as: NORMODYNE  Take 1 tablet (300 mg total) by mouth 2 (two) times daily.   NIFEdipine  60 MG 24 hr tablet Commonly known as: ADALAT  CC TAKE 1 TABLET BY MOUTH 2 TIMES DAILY.   NovoLOG  FlexPen 100 UNIT/ML FlexPen Generic drug: insulin  aspart Inject 12-24 Units into the  skin 3 (three) times daily with meals. Replaces: insulin  aspart 100 UNIT/ML injection Started by: Donell PARAS Jorah Hua   Omnipod 5 G7 Pods (Gen 5) Misc 1 each by Does not apply route every 3 (three) days.   PrePLUS 27-1 MG Tabs Take 1 tablet by mouth daily.         ALLERGIES: Allergies  Allergen Reactions   Pork-Derived Products      REVIEW OF SYSTEMS: A comprehensive ROS was conducted with the patient and is negative except as per HPI    OBJECTIVE:   VITAL SIGNS: BP 114/70 (BP Location: Left Arm, Patient Position: Sitting, Cuff Size: Normal)   Pulse 78   Ht 5' 2 (1.575 m)   Wt 124 lb (56.2 kg)   SpO2 99%   BMI 22.68 kg/m    PHYSICAL EXAM:  General: Pt appears well and is in NAD  Lungs: Clear with good BS bilat with no rales, rhonchi, or wheezes  Heart: RRR   Extremities:  Lower extremities - No pretibial edema.  Neuro: MS is good with appropriate affect, pt is alert and Ox3    DM foot exam: 10/15/2023  The skin of the feet is without sores or ulcerations. The pedal pulses are 2+ on right and 2+ on left. The sensation is decreased to a screening 5.07, 10 gram monofilament at the left great toe   DATA REVIEWED:  Lab Results  Component Value Date   HGBA1C 11.8 (A) 07/21/2024   HGBA1C 11.0 (A) 04/20/2024   HGBA1C 10.1 (A) 12/24/2023    Latest Reference Range & Units 07/21/24 12:20  Sodium 135 - 146 mmol/L 134 (L)  Potassium 3.5 - 5.3 mmol/L 4.8  Chloride 98 - 110 mmol/L 101  CO2 20 - 32 mmol/L 24  Glucose 65 - 99 mg/dL 669 (H)  BUN 7 - 25 mg/dL 18  Creatinine 9.49 - 9.02 mg/dL 9.48   Calcium  8.6 - 10.2 mg/dL 9.4  BUN/Creatinine Ratio 6 - 22 (calc) SEE NOTE:  eGFR > OR = 60 mL/min/1.38m2 122    Latest Reference Range & Units 07/21/24 12:20 07/21/24 13:54  Total CHOL/HDL Ratio <5.0 (calc) 4.5   Cholesterol <200 mg/dL 811   HDL Cholesterol > OR = 50 mg/dL 42 (L)   LDL Cholesterol (Calc) mg/dL (calc) 871 (H)   MICROALB/CREAT RATIO <30 mg/g creat  30 (H)  Non-HDL Cholesterol (Calc) <130 mg/dL (calc) 853 (H)   Triglycerides <150 mg/dL 81      Latest Reference Range & Units 07/21/24 12:20  LH mIU/mL 4.3  FSH mIU/mL 5.1  Prolactin ng/mL 4.1  Glucose 65 - 99 mg/dL 669 (H)  Estradiol  pg/mL 166  TSH mIU/L 0.37 (L)      ASSESSMENT / PLAN / RECOMMENDATIONS:   1) Type 1 Diabetes Mellitus, poorly controlled, With neuropathic, and retinopathic and neuropathic complications - Most recent A1c of  11.8 %. Goal A1c < 7.0%.    - Patient continues with hyperglycemia due to imperfect adherence to medication -Patient states she has no OmniPod, attributes this to improper prescription by our office, I did prescribe 45 pods back in May with a year supply.  My assistant verified with the pharmacy the patient received #10 pods on June 6, per pharmacist Medicaid only approves 15 pods in a month.  Also per pharmacist, due to her insurance they cannot have her medication auto refill and the patient should be contacting them for refills, and no one from the family or the patient has contacted the  pharmacy for refills but they do have refills available -She also indicated that she does not have enough insulin , I explained to the patient that I prescribed 8 vials, patient states she was only provided 4 vials through the pharmacy, my assistant verified that the patient was in fact provided with #8 vials on 04/20/2024. -We again went over the fact that if she does not have her OmniPod on she should inject insulin .  She initially said she does not have any insulin  pens at home, but throughout the  visit she did indicate that she does have Lantus  pen but she only used it 2 days ago, when asked why she did not use it for the past 2 weeks since she had it available, the patient stated that she did not want to use Lantus  because she did not have NovoLog .  I explained to the patient that just because she does not have the OmniPod, she could use NovoLog  vials to inject NovoLog  with each meal, but the patient states she does not have enough syringes to do that? -I explained to the patient that my goal is to help her and provide her with all the refills that she needs, every time she comes I provide her with all the refills needed, on her last appointment I even showed her all the refills that were sent on the AVS, but she again continues to run out of medication and implies that we are not prescribing her enough medications - I did advise the patient that it is best to return to insulin  pens as the pump has not been able to control her glucose due to multiple issues and obtaining the prescription   MEDICATIONS: Stop OmniPod Start Lantus  32 units daily Take NovoLog  12 units with each meal Start correction factor: NovoLog  (BG -130/50) TIDQAC      EDUCATION / INSTRUCTIONS: BG monitoring instructions: Patient is instructed to check her blood sugars 3 times a day. Call [place] [facility] if: BG persistently < 70  I reviewed the Rule of 15 for the treatment of hypoglycemia in detail with the patient. Literature supplied.   2) Diabetic complications:  Eye: Does  have known diabetic retinopathy.  Neuro/ Feet: Does  have known diabetic peripheral neuropathy. Renal: Patient does not have known baseline CKD. She is not on an ACEI/ARB at present.  3) Irregular menstruation :  - This is new - Will refer to GYN - Will obtain prolactin, FSH, LH, estradiol  and TFTs  4) Subclinical Hyperthyroidism:   - Unclear if this is the reason for irregular menstruation or not - No treatment will be  offered at this time but will recheck in 6 weeks   Follow-up in 3 months I spent 37 minutes preparing to see the patient by review of recent labs, imaging and procedures, obtaining and reviewing separately obtained history, communicating with the patient, ordering medications, tests or procedures, and documenting clinical information in the EHR including the differential Dx, treatment, and any further evaluation and other management   Signed electronically by: Stefano Redgie Butts, MD  [facility]  [facility] [address] [place], [place] [address] Phone: [phone] FAX: [phone]   CC: Tanda Bleacher, MD [address] [place] [address] Phone: [phone]  Fax: [phone]    Return to Endocrinology clinic as below: Future Appointments  Date Time Provider Department Center  07/27/2024  8:00 AM Valdemar Rogue, MD [facility] None

## 2024-07-21 NOTE — Patient Instructions (Addendum)
 Lantus  32 units daily  Novolog  12 units with each meal  Novolog  correctional insulin : ADD extra units on insulin  to your meal-time Novolog  dose if your blood sugars are higher than 180. Use the scale below to help guide you:   Blood sugar before meal Number of units to inject  Less than 180 0 unit  181- 230 1 units  231 - 280 2 units  281 - 330 3 units  331 - 380 4 units  381 - 430 5 units  431 - 480 6 units  481 - 530 7 units      HOW TO TREAT LOW BLOOD SUGARS (Blood sugar LESS THAN 70 MG/DL) Please follow the RULE OF 15 for the treatment of hypoglycemia treatment (when your (blood sugars are less than 70 mg/dL)   STEP 1: Take 15 grams of carbohydrates when your blood sugar is low, which includes:  3-4 GLUCOSE TABS  OR 3-4 OZ OF JUICE OR REGULAR SODA OR ONE TUBE OF GLUCOSE GEL    STEP 2: RECHECK blood sugar in 15 MINUTES STEP 3: If your blood sugar is still low at the 15 minute recheck --> then, go back to STEP 1 and treat AGAIN with another 15 grams of carbohydrates    -HOW TO TREAT LOW BLOOD SUGARS (Blood sugar LESS THAN 70 MG/DL) Please follow the RULE OF 15 for the treatment of hypoglycemia treatment (when your (blood sugars are less than 70 mg/dL)   STEP 1: Take 15 grams of carbohydrates when your blood sugar is low, which includes:  3-4 GLUCOSE TABS  OR 3-4 OZ OF JUICE OR REGULAR SODA OR ONE TUBE OF GLUCOSE GEL    STEP 2: RECHECK blood sugar in 15 MINUTES STEP 3: If your blood sugar is still low at the 15 minute recheck --> then, go back to STEP 1 and treat AGAIN with another 15 grams of carbohydrates.

## 2024-07-22 LAB — MICROALBUMIN / CREATININE URINE RATIO
Creatinine, Urine: 43 mg/dL (ref 20–275)
Microalb Creat Ratio: 30 mg/g{creat} — ABNORMAL HIGH (ref <30)
Microalb, Ur: 1.3 mg/dL

## 2024-07-25 ENCOUNTER — Ambulatory Visit: Payer: Self-pay | Admitting: Internal Medicine

## 2024-07-25 NOTE — Telephone Encounter (Signed)
 Please use interpreter line and contact the patient to let her know that her thyroid  is slightly overactive   Her numbers are mildly off and do not warrant medication at this time,, I would like to repeat her thyroid  levels in 6 weeks    She is also leaking extra protein in the urine, this is due to uncontrolled diabetes, encourage optimizing glucose as soon as possible to prevent further damage to the kidney

## 2024-07-25 NOTE — Progress Notes (Signed)
 [facility] - Clinic Note  07/27/2024     CHIEF COMPLAINT Patient presents for Retina Follow Up   HISTORY OF PRESENT ILLNESS: Kristin Clay is a 38 y.o. female who presents to the clinic today for:   HPI     Retina Follow Up   Patient presents with  Diabetic Retinopathy.  In both eyes.  This started 5 weeks ago.  Duration of 5 weeks.  Since onset it is stable.  I, the attending physician,  performed the HPI with the patient and updated documentation appropriately.        Comments   5 week retina follow up PDR pt is reporting no vision changes noticed she denies any flashes or floaters her last reading 250 yesterday       Last edited by Valdemar Rogue, MD on 07/27/2024 11:37 AM.     Patient states  [facility] is good  Referring physician: Jacques Sharper MD [address] [place], [place] [address]  HISTORICAL INFORMATION:   Selected notes from the MEDICAL RECORD NUMBER Referred by Dr. Jacques for diabetic retinal evaluation LEE:  Ocular Hx- PMH-pregnancy, DM    CURRENT MEDICATIONS: No current outpatient medications on file. (Ophthalmic Drugs)   No current facility-administered medications for this visit. (Ophthalmic Drugs)   Current Outpatient Medications (Other)  Medication Sig   aspirin  81 MG chewable tablet Chew by mouth.   Continuous Glucose Sensor (DEXCOM G7 SENSOR) MISC 1 Device by Other route as directed. Change sensors once every 10 days.   fluconazole  (DIFLUCAN ) 150 MG tablet Take 150 mg by mouth once.   insulin  aspart (NOVOLOG  FLEXPEN) 100 UNIT/ML FlexPen Inject 12-24 Units into the skin 3 (three) times daily with meals.   Insulin  Disposable Pump (OMNIPOD 5 G7 PODS, GEN 5,) MISC 1 each by Does not apply route every 3 (three) days.   insulin  glargine (LANTUS ) 100 UNIT/ML Solostar Pen Inject 32 Units into the skin daily.   Insulin  Pen Needle 32G X 4 MM MISC 1 Device by Does not apply route in the morning, at noon, in the  evening, and at bedtime.   labetalol  (NORMODYNE ) 300 MG tablet Take 1 tablet (300 mg total) by mouth 2 (two) times daily.   NIFEdipine  (ADALAT  CC) 60 MG 24 hr tablet TAKE 1 TABLET BY MOUTH 2 TIMES DAILY.   Prenatal Vit-Fe Fumarate-FA (PREPLUS) 27-1 MG TABS Take 1 tablet by mouth daily.   No current facility-administered medications for this visit. (Other)   REVIEW OF SYSTEMS: ROS   Positive for: Endocrine, Eyes Negative for: Constitutional, Gastrointestinal, Neurological, Skin, Genitourinary, Musculoskeletal, HENT, Cardiovascular, Respiratory, Psychiatric, Allergic/Imm, Heme/Lymph Last edited by Resa Delon ORN, COT on 07/27/2024  7:55 AM.     ALLERGIES Allergies  Allergen Reactions   Pork-Derived Products    PAST MEDICAL HISTORY Past Medical History:  Diagnosis Date   Chronic hypertension    Complication of anesthesia    with hand surgery, local anes did not work, tried twice- had to put her to sleep   Diabetic retinopathy (HCC)    DKA (diabetic ketoacidosis) (HCC) 10/2020   Epidural hematoma (HCC) 07/04/2021   Hypertension in pregnancy, preeclampsia, severe, delivered 12/15/2021   Obstetric vaginal laceration with type 3c third degree perineal laceration 06/29/2021   Pyelonephritis 10/2020   Rh negative status during pregnancy 05/01/2021   s/p Rhogam 05/13/21   Type I diabetes mellitus (HCC)    dx 33yrs ago   UTI (urinary tract infection)  Past Surgical History:  Procedure Laterality Date   CESAREAN SECTION  05/16/2022   Procedure: CESAREAN SECTION;  Surgeon: Zina Jerilynn LABOR, MD;  Location: [facility]S;  Service: Obstetrics;;   female circumcision Bilateral    clitorectomy as well   FOOT SURGERY  2018   HAND SURGERY  2019   FAMILY HISTORY Family History  Problem Relation Age of Onset   Hyperlipidemia Mother    Hypertension Mother    Kidney disease Father    Alzheimer's disease Father    SOCIAL HISTORY Social History   Tobacco Use   Smoking status: Never    Smokeless tobacco: Never  Vaping Use   Vaping status: Never Used  Substance Use Topics   Alcohol use: Never   Drug use: Never       OPHTHALMIC EXAM:  Base Eye Exam     Visual Acuity (Snellen - Linear)       Right Left   Dist [place] 20/20 -3 20/20 -2         Tonometry (Tonopen, 7:59 AM)       Right Left   Pressure 18 18         Pupils       Pupils Dark Light Shape React APD   Right PERRL 4 3 Round Brisk None   Left PERRL 4 3 Round Brisk None         Visual Fields       Left Right    Full Full         Extraocular Movement       Right Left    Full, Ortho Full, Ortho         Neuro/Psych     Oriented x3: Yes   Mood/Affect: Normal         Dilation     Both eyes: 2.5% Phenylephrine  @ 7:59 AM           Slit Lamp and Fundus Exam     Slit Lamp Exam       Right Left   Lids/Lashes normal normal   Conjunctiva/Sclera mild melanosis, Trace Injection, +corkscrew vessels nasally mild melanosis   Cornea trace PEE Clear   Anterior Chamber deep, clear, narrow temporal angle deep and quiet   Iris round and dilated, no NVI round and dilated, no NVI   Lens 1+Cortical changes Clear   Anterior Vitreous syneresis syneresis         Fundus Exam       Right Left   Disc pink and sharp; compact; +fibrosis, fine NVD -- regressing; +PPP pink and sharp; compact; +fibrosis w/ fine NVD -- regressing   C/D Ratio 0.2 0.2   Macula good foveal reflex, scattered MA/DBH, punctate exudates and cystic changes IT macula -- slightly improved, fine NVE inferior and temporal macula - regressing, ERM with striae nasally good foveal reflex, ERM with striae, scattered MA/DBH greatest temporal macula, exudates temporally, scattered fibrosis and NV -- regressing   Vessels attenuated, mild tortuosity, early NVE greatest inferior arcades attenuated, +NVE -- scattered and regressing, +fibrosis, Tortuous   Periphery attached, 360 MA/DBH, good 360 PRP laser changes and posterior  fill in -- some room for fill in, scattered NV -- regressing attached, 360 MA/DBH, good 360 PRP with room for fill in, scattered NV -- regressing, punctate fibrosis           IMAGING AND PROCEDURES  Imaging and Procedures for 07/27/2024  OCT, Retina - OU - Both Eyes  Right Eye Quality was good. Central Foveal Thickness: 228. Progression has improved. Findings include normal foveal contour, no SRF, intraretinal hyper-reflective material, intraretinal fluid, vitreomacular adhesion , preretinal fibrosis (Mild interval improvement in IRF/IRHM IT macula; +PRF overlying disc and inferior macula).   Left Eye Quality was good. Central Foveal Thickness: 246. Progression has been stable. Findings include normal foveal contour, no IRF, no SRF, epiretinal membrane, macular pucker, vitreous traction, preretinal fibrosis (Trace cystic changes temporal macula -- stably improved, pre retinal fibrosis overlying disc extending to macula w/ mild traction -- stable).   Notes *Images captured and stored on drive  Diagnosis / Impression:  PDR w/ DME OU OD: Mild interval improvement in IRF/IRHM IT macula; +PRF overlying disc and inferior macula OS: Trace cystic changes inferior macula -- stably improved, pre retinal fibrosis overlying disc extending to macula w/ mild traction -- stable  Clinical management:  See below  Abbreviations: NFP - Normal foveal profile. CME - cystoid macular edema. PED - pigment epithelial detachment. IRF - intraretinal fluid. SRF - subretinal fluid. EZ - ellipsoid zone. ERM - epiretinal membrane. ORA - outer retinal atrophy. ORT - outer retinal tubulation. SRHM - subretinal hyper-reflective material. IRHM - intraretinal hyper-reflective material      Intravitreal Injection, Pharmacologic Agent - OD - Right Eye       Time Out 07/27/2024. 8:34 AM. Confirmed correct patient, procedure, site, and patient consented.   Anesthesia Topical anesthesia was used. Anesthetic  medications included Lidocaine  2%, Proparacaine 0.5%.   Procedure Preparation included 5% betadine to ocular surface, eyelid speculum. A (32g) needle was used.   Injection: 2 mg aflibercept  2 MG/0.05ML   Route: Intravitreal, Site: Right Eye   NDC: [id number], Lot: [id number], Expiration date: 09/22/2025, Waste: 0 mL   Post-op Post injection exam found visual acuity of at least counting fingers. The patient tolerated the procedure well. There were no complications. The patient received written and verbal post procedure care education.            ASSESSMENT/PLAN:    ICD-10-CM   1. 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 Current use of insulin  (HCC)  Z79.4     3. Epiretinal membrane (ERM) of both eyes  H35.373     4. Essential hypertension  I10     5. Hypertensive retinopathy of both eyes  H35.033     6. High-risk pregnancy in second trimester  O09.92      1-3. 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, both eyes - last A1c 11.8 (08.29.25), 10.1 (01.31.25), 10.7 (10.30.24), 13.9 (08.30.24), 10.9 (02.16.24), 14.5 (10.10.23), 7.4 (06.06.23) - delayed to follow up from 4 weeks to 9 weeks (02.05.24 to 04.09.24)  - s/p PRP OD (03.27.23), fill in (05.15.23)  - s/p PRP OS (04.17.23), fill in (10.20.23) - s/p IVA OS #1 (12.08.23), #2 (01.08.24), #3 (04.09.24), #4 (05.21.24), #5 (07.01.24), #6 (8.12.24), #7 (09.27.24), #8 (11.19.24), #9 (12.31.24),  #10 (02.11.25) - s/p IVA OD #1 (02.05.24), #2 (04.09.24), #3 (05.21.24), #4 (07.01.24), #5 (08.12.24), #6 (09.27.24), #7 (11.19.24), #8 (12.31.24), #9 (02.11.25) - IVA resistance ====================== - s/p IVE OU #1 (03.28.25), #2 (05.09.25), #3 (06.20.25) - s/p IVE OD #4 (07.31.25) - BCVA OD 20/20-improved from  20/25, OS stable at 20/20 - FA 10.20.23  shows 360 midzonal NVE w/ leakage OU - OCT shows OD: Mild interval improvement in IRF/IRHM IT macula; +PRF overlying disc and inferior macula; OS: Mild cystic changes inferior macula -- stably improved, pre retinal fibrosis overlying disc extending to macula w/ mild traction -- stable at 5 weeks - recommend IVE today OD #5 (09.04.25) with follow up in 5-6  weeks - will hold treatment in OS again today -- pt in agreement - pt wishes to proceed with injection OD - RBA of procedure discussed, questions answered - IVE informed consent obtained and signed, 03.28.25 (OU) - see procedure note - approved for Eylea  for 2025 - f/u 5-6 weeks -- DFE/OCT, possible injection(s)  4-6. History of high risk pregnancy w/ +HTN and hypertensive retinopathy - s/p Cesarean 06.24.23 (baby boy) - 33 weeks and 1 day, 3lbs, now out of the NICU and at home - history of pre-eclampsia - BP improved since delivery - discussed importance of tight BP control  - continue to monitor  Ophthalmic Meds Ordered this visit:  Meds ordered this encounter  Medications   aflibercept  (EYLEA ) SOLN 2 mg     Return for 5-6wks PDR OU, DFE, OCT, Possible Injxn.  There are no Patient Instructions on file for this visit.  This document serves as a record of services personally performed by Redell JUDITHANN Hans, MD, PhD. It was created on their behalf by Avelina Pereyra, COA an ophthalmic technician. The creation of this record is the provider's dictation and/or activities during the visit.   Electronically signed by: Avelina GORMAN Pereyra, COT  07/27/24  11:39 AM   This document serves as a record of services personally performed by Redell JUDITHANN Hans, MD, PhD. It was created on their behalf by Almetta Pesa, an ophthalmic technician. The creation of this record is the provider's dictation and/or activities during the visit.    Electronically signed by: Almetta Pesa, OA, 07/27/24  11:39 AM  Redell JUDITHANN Hans, M.D., Ph.D. Diseases & Surgery of the Retina and Vitreous [facility]  I have reviewed the above documentation for accuracy and completeness, and I agree with the above. Redell JUDITHANN Hans, M.D., Ph.D. 07/27/24 11:41 AM   Abbreviations: M myopia (nearsighted); A astigmatism; H hyperopia (farsighted); P presbyopia; Mrx spectacle prescription;  CTL contact lenses; OD right eye; OS left eye; OU both eyes  XT exotropia; ET esotropia; PEK punctate epithelial keratitis; PEE punctate epithelial erosions; DES dry eye syndrome; MGD meibomian gland dysfunction; ATs artificial tears; PFAT's preservative free artificial tears; NSC nuclear sclerotic cataract; PSC posterior subcapsular cataract; ERM epi-retinal membrane; PVD posterior vitreous detachment; RD retinal detachment; DM diabetes mellitus; DR diabetic retinopathy; NPDR non-proliferative diabetic retinopathy; PDR proliferative diabetic retinopathy; CSME clinically significant macular edema; DME diabetic macular edema; dbh dot blot hemorrhages; CWS cotton wool spot; POAG primary open angle glaucoma; C/D cup-to-disc ratio; HVF humphrey visual field; GVF goldmann visual field; OCT optical coherence tomography; IOP intraocular pressure; BRVO Branch retinal vein occlusion; CRVO central retinal vein occlusion; CRAO central retinal artery occlusion; BRAO branch retinal artery occlusion; RT retinal tear; SB scleral buckle; PPV pars plana vitrectomy; VH Vitreous hemorrhage; PRP panretinal laser photocoagulation; IVK intravitreal kenalog ; VMT vitreomacular traction; MH Macular hole;  NVD neovascularization of the disc; NVE neovascularization elsewhere; AREDS age related eye disease study; ARMD age related macular degeneration; POAG primary open angle glaucoma; EBMD epithelial/anterior basement membrane dystrophy; ACIOL anterior chamber intraocular lens; IOL intraocular lens; PCIOL posterior chamber intraocular lens; Phaco/IOL phacoemulsification  with intraocular  lens placement; PRK photorefractive keratectomy; LASIK laser assisted in situ keratomileusis; HTN hypertension; DM diabetes mellitus; COPD chronic obstructive pulmonary disease

## 2024-07-27 ENCOUNTER — Ambulatory Visit (INDEPENDENT_AMBULATORY_CARE_PROVIDER_SITE_OTHER): Admitting: Ophthalmology

## 2024-07-27 ENCOUNTER — Encounter (INDEPENDENT_AMBULATORY_CARE_PROVIDER_SITE_OTHER): Payer: Self-pay | Admitting: Ophthalmology

## 2024-07-27 DIAGNOSIS — H35033 Hypertensive retinopathy, bilateral: Secondary | ICD-10-CM

## 2024-07-27 DIAGNOSIS — O0992 Supervision of high risk pregnancy, unspecified, second trimester: Secondary | ICD-10-CM

## 2024-07-27 DIAGNOSIS — I10 Essential (primary) hypertension: Secondary | ICD-10-CM | POA: Diagnosis not present

## 2024-07-27 DIAGNOSIS — H35373 Puckering of macula, bilateral: Secondary | ICD-10-CM | POA: Diagnosis not present

## 2024-07-27 DIAGNOSIS — Z794 Long term (current) use of insulin: Secondary | ICD-10-CM | POA: Diagnosis not present

## 2024-07-27 DIAGNOSIS — E103513 Type 1 diabetes mellitus with proliferative diabetic retinopathy with macular edema, bilateral: Secondary | ICD-10-CM | POA: Diagnosis not present

## 2024-07-27 MED ORDER — AFLIBERCEPT 2MG/0.05ML IZ SOLN FOR KALEIDOSCOPE
2.0000 mg | INTRAVITREAL | Status: AC | PRN
  Administered 2024-07-27: 2 mg via INTRAVITREAL

## 2024-07-28 ENCOUNTER — Encounter (INDEPENDENT_AMBULATORY_CARE_PROVIDER_SITE_OTHER): Admitting: Ophthalmology

## 2024-08-21 NOTE — Progress Notes (Signed)
 [facility] - Clinic Note  08/28/2024     CHIEF COMPLAINT Patient presents for Retina Follow Up   HISTORY OF PRESENT ILLNESS: Kristin Clay is a 38 y.o. female who presents to the clinic today for:   HPI     Retina Follow Up   Patient presents with  Diabetic Retinopathy.  In both eyes.  This started 5 weeks ago.  Duration of 5 weeks.  Since onset it is stable.  I, the attending physician,  performed the HPI with the patient and updated documentation appropriately.        Comments   5 week retina follow up PDR OU pt is reporting no vision changes noticed she denies any flashes or floaters her last reading was 150 last night       Last edited by Valdemar Rogue, MD on 08/28/2024  2:41 PM.    Patient states  [facility] is good  Referring physician: Jacques Sharper MD [address] [place], [place] [address]  HISTORICAL INFORMATION:   Selected notes from the MEDICAL RECORD NUMBER Referred by Dr. Jacques for diabetic retinal evaluation LEE:  Ocular Hx- PMH-pregnancy, DM    CURRENT MEDICATIONS: No current outpatient medications on file. (Ophthalmic Drugs)   No current facility-administered medications for this visit. (Ophthalmic Drugs)   Current Outpatient Medications (Other)  Medication Sig   aspirin  81 MG chewable tablet Chew by mouth.   Continuous Glucose Sensor (DEXCOM G7 SENSOR) MISC 1 Device by Other route as directed. Change sensors once every 10 days.   fluconazole  (DIFLUCAN ) 150 MG tablet Take 150 mg by mouth once.   insulin  aspart (NOVOLOG  FLEXPEN) 100 UNIT/ML FlexPen Inject 12-24 Units into the skin 3 (three) times daily with meals.   Insulin  Disposable Pump (OMNIPOD 5 G7 PODS, GEN 5,) MISC 1 each by Does not apply route every 3 (three) days.   insulin  glargine (LANTUS ) 100 UNIT/ML Solostar Pen Inject 32 Units into the skin daily.   Insulin  Pen Needle 32G X 4 MM MISC 1 Device by Does not apply route in the morning, at noon, in  the evening, and at bedtime.   labetalol  (NORMODYNE ) 300 MG tablet Take 1 tablet (300 mg total) by mouth 2 (two) times daily.   NIFEdipine  (ADALAT  CC) 60 MG 24 hr tablet TAKE 1 TABLET BY MOUTH 2 TIMES DAILY.   Prenatal Vit-Fe Fumarate-FA (PREPLUS) 27-1 MG TABS Take 1 tablet by mouth daily.   No current facility-administered medications for this visit. (Other)   REVIEW OF SYSTEMS: ROS   Positive for: Endocrine, Eyes Negative for: Constitutional, Gastrointestinal, Neurological, Skin, Genitourinary, Musculoskeletal, HENT, Cardiovascular, Respiratory, Psychiatric, Allergic/Imm, Heme/Lymph Last edited by Resa Delon ORN, COT on 08/28/2024  2:10 PM.      ALLERGIES Allergies  Allergen Reactions   Pork-Derived Products    PAST MEDICAL HISTORY Past Medical History:  Diagnosis Date   Chronic hypertension    Complication of anesthesia    with hand surgery, local anes did not work, tried twice- had to put her to sleep   Diabetic retinopathy (HCC)    DKA (diabetic ketoacidosis) (HCC) 10/2020   Epidural hematoma (HCC) 07/04/2021   Hypertension in pregnancy, preeclampsia, severe, delivered 12/15/2021   Obstetric vaginal laceration with type 3c third degree perineal laceration 06/29/2021   Pyelonephritis 10/2020   Rh negative status during pregnancy 05/01/2021   s/p Rhogam 05/13/21   Type I diabetes mellitus (HCC)    dx 50yrs ago   UTI (  urinary tract infection)    Past Surgical History:  Procedure Laterality Date   CESAREAN SECTION  05/16/2022   Procedure: CESAREAN SECTION;  Surgeon: Zina Jerilynn LABOR, MD;  Location: [facility]S;  Service: Obstetrics;;   female circumcision Bilateral    clitorectomy as well   FOOT SURGERY  2018   HAND SURGERY  2019   FAMILY HISTORY Family History  Problem Relation Age of Onset   Hyperlipidemia Mother    Hypertension Mother    Kidney disease Father    Alzheimer's disease Father    SOCIAL HISTORY Social History   Tobacco Use   Smoking  status: Never   Smokeless tobacco: Never  Vaping Use   Vaping status: Never Used  Substance Use Topics   Alcohol use: Never   Drug use: Never       OPHTHALMIC EXAM:  Base Eye Exam     Visual Acuity (Snellen - Linear)       Right Left   Dist [place] 20/25 -2 20/25 -1   Dist ph [place] NI 20/20 -2         Tonometry (Tonopen, 2:15 PM)       Right Left   Pressure 14 16         Pupils       Pupils Dark Light Shape React APD   Right PERRL 4 3 Round Brisk None   Left PERRL 4 3 Round Brisk None         Visual Fields       Left Right    Full Full         Extraocular Movement       Right Left    Full, Ortho Full, Ortho         Neuro/Psych     Oriented x3: Yes   Mood/Affect: Normal         Dilation     Both eyes: 2.5% Phenylephrine  @ 2:15 PM           Slit Lamp and Fundus Exam     Slit Lamp Exam       Right Left   Lids/Lashes normal normal   Conjunctiva/Sclera mild melanosis, Trace Injection, +corkscrew vessels nasally mild melanosis   Cornea trace PEE Clear   Anterior Chamber deep, clear, narrow temporal angle deep and quiet   Iris round and dilated, no NVI round and dilated, no NVI   Lens 1+Cortical changes Clear   Anterior Vitreous syneresis syneresis         Fundus Exam       Right Left   Disc pink and sharp; compact; +fibrosis, fine NVD -- regressing; +PPP pink and sharp; compact; +fibrosis w/ fine NVD -- regressing   C/D Ratio 0.2 0.2   Macula good foveal reflex, scattered MA/DBH, punctate exudates and cystic changes IT macula -- slightly improved, fine NVE inferior and temporal macula - regressing, ERM with striae nasally good foveal reflex, ERM with striae, scattered MA/DBH greatest temporal macula, exudates temporally, scattered fibrosis and NV -- regressing   Vessels attenuated, mild tortuosity, early NVE greatest inferior arcades attenuated, +NVE -- scattered and regressing, +fibrosis, Tortuous   Periphery attached, 360 MA/DBH,  good 360 PRP laser changes and posterior fill in -- some room for fill in, scattered NV -- regressing attached, 360 MA/DBH, good 360 PRP with room for fill in, scattered NV -- regressing, punctate fibrosis           IMAGING AND PROCEDURES  Imaging and  Procedures for 08/28/2024  OCT, Retina - OU - Both Eyes       Right Eye Quality was good. Central Foveal Thickness: 229. Progression has improved. Findings include normal foveal contour, no SRF, intraretinal hyper-reflective material, intraretinal fluid, vitreomacular adhesion , preretinal fibrosis (Mild interval improvement in IRF/IRHM IT macula; +PRF overlying disc and inferior macula).   Left Eye Quality was good. Central Foveal Thickness: 247. Progression has been stable. Findings include normal foveal contour, no IRF, no SRF, epiretinal membrane, macular pucker, vitreous traction, preretinal fibrosis (Trace cystic changes temporal macula -- stably improved, pre retinal fibrosis overlying disc extending to macula w/ mild traction -- stable).   Notes *Images captured and stored on drive  Diagnosis / Impression:  PDR w/ DME OU OD: Mild interval improvement in IRF/IRHM IT macula; +PRF overlying disc and inferior macula OS: Trace cystic changes inferior macula -- stably improved, pre retinal fibrosis overlying disc extending to macula w/ mild traction -- stable  Clinical management:  See below  Abbreviations: NFP - Normal foveal profile. CME - cystoid macular edema. PED - pigment epithelial detachment. IRF - intraretinal fluid. SRF - subretinal fluid. EZ - ellipsoid zone. ERM - epiretinal membrane. ORA - outer retinal atrophy. ORT - outer retinal tubulation. SRHM - subretinal hyper-reflective material. IRHM - intraretinal hyper-reflective material      Intravitreal Injection, Pharmacologic Agent - OD - Right Eye       Time Out 08/28/2024. 2:28 PM. Confirmed correct patient, procedure, site, and patient consented.    Anesthesia Topical anesthesia was used. Anesthetic medications included Lidocaine  2%, Proparacaine 0.5%.   Procedure Preparation included 5% betadine to ocular surface, eyelid speculum. A (32g) needle was used.   Injection: 2 mg aflibercept  2 MG/0.05ML   Route: Intravitreal, Site: Right Eye   NDC: [id number], Lot: [id number], Expiration date: 10/22/2025, Waste: 0 mL   Post-op Post injection exam found visual acuity of at least counting fingers. The patient tolerated the procedure well. There were no complications. The patient received written and verbal post procedure care education.             ASSESSMENT/PLAN:    ICD-10-CM   1. 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 Current use of insulin  (HCC)  Z79.4     3. Epiretinal membrane (ERM) of both eyes  H35.373     4. Essential hypertension  I10     5. Hypertensive retinopathy of both eyes  H35.033     6. High-risk pregnancy in second trimester  O09.92      1-3. 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, both eyes - last A1c 11.8 (08.29.25), 10.1 (01.31.25), 10.7 (10.30.24), 13.9 (08.30.24), 10.9 (02.16.24)- delayed to follow up from 4 weeks to 9 weeks (02.05.24 to 04.09.24)  - s/p PRP OD (03.27.23), fill in (05.15.23)  - s/p PRP OS (04.17.23), fill in (10.20.23) - s/p IVA OS #1 (12.08.23), #2 (01.08.24), #3 (04.09.24), #4 (05.21.24), #5 (07.01.24), #6 (8.12.24), #7 (09.27.24), #8 (11.19.24), #9 (12.31.24),  #10 (02.11.25) - s/p IVA OD #1 (02.05.24), #2 (04.09.24), #3 (05.21.24), #4 (07.01.24), #5 (08.12.24), #6 (09.27.24), #7 (11.19.24), #8 (12.31.24), #9 (02.11.25) - IVA resistance ====================== - s/p IVE OU #1 (03.28.25), #2 (05.09.25), #3 (06.20.25) - s/p IVE OD #4 (07.31.25), #5  (  09.04.25)  - BCVA OD 20/20-improved from 20/25, OS stable at 20/20 - FA 10.20.23 shows 360 midzonal NVE w/ leakage OU - OCT shows OD: Mild interval improvement in IRF/IRHM IT macula; +PRF overlying disc and inferior macula; OS: Mild cystic changes inferior macula -- stably improved, pre retinal fibrosis overlying disc extending to macula w/ mild traction -- stable at 4.5 weeks - recommend IVE today OD #6 (10.06.25) with follow up in 4-5  weeks - will hold treatment in OS again today -- pt in agreement - pt wishes to proceed with injection OD - RBA of procedure discussed, questions answered - IVE informed consent obtained and signed, 03.28.25 (OU) - see procedure note - approved for Eylea  for 2025 - f/u 4-5 weeks -- DFE/OCT, FA transit OD, possible injection(s)  4-6. History of high risk pregnancy w/ +HTN and hypertensive retinopathy - s/p Cesarean 06.24.23 (baby boy) - 33 weeks and 1 day, 3lbs, now out of the NICU and at home - history of pre-eclampsia - BP improved since delivery - discussed importance of tight BP control  - continue to monitor  Ophthalmic Meds Ordered this visit:  Meds ordered this encounter  Medications   aflibercept  (EYLEA ) SOLN 2 mg     Return in about 5 weeks (around 10/02/2024) for f/u, PDR, DFE, OCT, Possible, IVE, OD, Vs., OU, FA, Transit OD.  There are no Patient Instructions on file for this visit.  This document serves as a record of services personally performed by Redell JUDITHANN Hans, MD, PhD. It was created on their behalf by Avelina Pereyra, COA an ophthalmic technician. The creation of this record is the provider's dictation and/or activities during the visit.   Electronically signed by: Avelina GORMAN Pereyra, COT  08/28/24  2:41 PM   This document serves as a record of services personally performed by Redell JUDITHANN Hans, MD, PhD. It was created on their behalf by Wanda GEANNIE Keens, COT an ophthalmic technician. The creation of this record is the provider's  dictation and/or activities during the visit.    Electronically signed by:  Wanda GEANNIE Keens, COT  08/28/24 2:41 PM  Redell JUDITHANN Hans, M.D., Ph.D. Diseases & Surgery of the Retina and Vitreous [facility]  I have reviewed the above documentation for accuracy and completeness, and I agree with the above. Redell JUDITHANN Hans, M.D., Ph.D. 08/28/24 2:48 PM   Abbreviations: M myopia (nearsighted); A astigmatism; H hyperopia (farsighted); P presbyopia; Mrx spectacle prescription;  CTL contact lenses; OD right eye; OS left eye; OU both eyes  XT exotropia; ET esotropia; PEK punctate epithelial keratitis; PEE punctate epithelial erosions; DES dry eye syndrome; MGD meibomian gland dysfunction; ATs artificial tears; PFAT's preservative free artificial tears; NSC nuclear sclerotic cataract; PSC posterior subcapsular cataract; ERM epi-retinal membrane; PVD posterior vitreous detachment; RD retinal detachment; DM diabetes mellitus; DR diabetic retinopathy; NPDR non-proliferative diabetic retinopathy; PDR proliferative diabetic retinopathy; CSME clinically significant macular edema; DME diabetic macular edema; dbh dot blot hemorrhages; CWS cotton wool spot; POAG primary open angle glaucoma; C/D cup-to-disc ratio; HVF humphrey visual field; GVF goldmann visual field; OCT optical coherence tomography; IOP intraocular pressure; BRVO Branch retinal vein occlusion; CRVO central retinal vein occlusion; CRAO central retinal artery occlusion; BRAO branch retinal artery occlusion; RT retinal tear; SB scleral buckle; PPV pars plana vitrectomy; VH Vitreous hemorrhage; PRP panretinal laser photocoagulation; IVK intravitreal kenalog ; VMT vitreomacular traction; MH Macular hole;  NVD neovascularization of the disc; NVE neovascularization elsewhere; AREDS age related eye disease study;  ARMD age related macular degeneration; POAG primary open angle glaucoma; EBMD epithelial/anterior basement membrane dystrophy;  ACIOL anterior chamber intraocular lens; IOL intraocular lens; PCIOL posterior chamber intraocular lens; Phaco/IOL phacoemulsification with intraocular lens placement; PRK photorefractive keratectomy; LASIK laser assisted in situ keratomileusis; HTN hypertension; DM diabetes mellitus; COPD chronic obstructive pulmonary disease

## 2024-08-28 ENCOUNTER — Ambulatory Visit (INDEPENDENT_AMBULATORY_CARE_PROVIDER_SITE_OTHER): Admitting: Ophthalmology

## 2024-08-28 ENCOUNTER — Encounter (INDEPENDENT_AMBULATORY_CARE_PROVIDER_SITE_OTHER): Payer: Self-pay | Admitting: Ophthalmology

## 2024-08-28 DIAGNOSIS — E103513 Type 1 diabetes mellitus with proliferative diabetic retinopathy with macular edema, bilateral: Secondary | ICD-10-CM | POA: Diagnosis not present

## 2024-08-28 DIAGNOSIS — H35373 Puckering of macula, bilateral: Secondary | ICD-10-CM

## 2024-08-28 DIAGNOSIS — I10 Essential (primary) hypertension: Secondary | ICD-10-CM

## 2024-08-28 DIAGNOSIS — Z794 Long term (current) use of insulin: Secondary | ICD-10-CM | POA: Diagnosis not present

## 2024-08-28 DIAGNOSIS — H35033 Hypertensive retinopathy, bilateral: Secondary | ICD-10-CM

## 2024-08-28 DIAGNOSIS — O0992 Supervision of high risk pregnancy, unspecified, second trimester: Secondary | ICD-10-CM

## 2024-08-28 MED ORDER — AFLIBERCEPT 2MG/0.05ML IZ SOLN FOR KALEIDOSCOPE
2.0000 mg | INTRAVITREAL | Status: AC | PRN
  Administered 2024-08-28: 2 mg via INTRAVITREAL

## 2024-09-14 ENCOUNTER — Other Ambulatory Visit: Payer: Self-pay | Admitting: Cardiology

## 2024-09-29 NOTE — Progress Notes (Signed)
 [facility] - Clinic Note  10/04/2024     CHIEF COMPLAINT Patient presents for Retina Follow Up   HISTORY OF PRESENT ILLNESS: Kristin Clay is a 38 y.o. female who presents to the clinic today for:   HPI     Retina Follow Up   Patient presents with  Diabetic Retinopathy.  In both eyes.  This started 5 weeks ago.  I, the attending physician,  performed the HPI with the patient and updated documentation appropriately.        Comments   Patient here for 5 weeks retina follow up for PDR OU. Patient states vision doing good. No eye pain.       Last edited by Valdemar Rogue, MD on 10/04/2024 11:55 PM.     Patient states  [facility] is [facility]  Referring physician: Jacques Sharper MD [address] [place], [place] [address]  HISTORICAL INFORMATION:   Selected notes from the MEDICAL RECORD NUMBER Referred by Dr. Jacques for diabetic retinal evaluation LEE:  Ocular Hx- PMH-pregnancy, DM    CURRENT MEDICATIONS: No current outpatient medications on file. (Ophthalmic Drugs)   No current facility-administered medications for this visit. (Ophthalmic Drugs)   Current Outpatient Medications (Other)  Medication Sig   aspirin  81 MG chewable tablet Chew by mouth.   Continuous Glucose Sensor (DEXCOM G7 SENSOR) MISC 1 Device by Other route as directed. Change sensors once every 10 days.   fluconazole  (DIFLUCAN ) 150 MG tablet Take 150 mg by mouth once.   insulin  aspart (NOVOLOG  FLEXPEN) 100 UNIT/ML FlexPen Inject 12-24 Units into the skin 3 (three) times daily with meals.   Insulin  Disposable Pump (OMNIPOD 5 G7 PODS, GEN 5,) MISC 1 each by Does not apply route every 3 (three) days.   insulin  glargine (LANTUS ) 100 UNIT/ML Solostar Pen Inject 32 Units into the skin daily.   Insulin  Pen Needle 32G X 4 MM MISC 1 Device by Does not apply route in the morning, at noon, in the evening, and at bedtime.   labetalol  (NORMODYNE ) 300 MG tablet Take 1 tablet (300  mg total) by mouth 2 (two) times daily.   NIFEdipine  (ADALAT  CC) 60 MG 24 hr tablet TAKE 1 TABLET BY MOUTH 2 TIMES DAILY.   Prenatal Vit-Fe Fumarate-FA (PREPLUS) 27-1 MG TABS Take 1 tablet by mouth daily.   No current facility-administered medications for this visit. (Other)   REVIEW OF SYSTEMS: ROS   Positive for: Endocrine, Eyes Negative for: Constitutional, Gastrointestinal, Neurological, Skin, Genitourinary, Musculoskeletal, HENT, Cardiovascular, Respiratory, Psychiatric, Allergic/Imm, Heme/Lymph Last edited by Orval Asberry RAMAN, COA on 10/04/2024  1:43 PM.       ALLERGIES Allergies  Allergen Reactions   Porcine (Pork) Protein-Containing Drug Products    PAST MEDICAL HISTORY Past Medical History:  Diagnosis Date   Chronic hypertension    Complication of anesthesia    with hand surgery, local anes did not work, tried twice- had to put her to sleep   Diabetic retinopathy (HCC)    DKA (diabetic ketoacidosis) (HCC) 10/2020   Epidural hematoma (HCC) 07/04/2021   Hypertension in pregnancy, preeclampsia, severe, delivered 12/15/2021   Obstetric vaginal laceration with type 3c third degree perineal laceration 06/29/2021   Pyelonephritis 10/2020   Rh negative status during pregnancy 05/01/2021   s/p Rhogam 05/13/21   Type I diabetes mellitus (HCC)    dx 56yrs ago   UTI (urinary tract infection)    Past Surgical History:  Procedure Laterality Date  CESAREAN SECTION  05/16/2022   Procedure: CESAREAN SECTION;  Surgeon: Zina Jerilynn LABOR, MD;  Location: [facility];  Service: Obstetrics;;   female circumcision Bilateral    clitorectomy as well   FOOT SURGERY  2018   HAND SURGERY  2019   FAMILY HISTORY Family History  Problem Relation Age of Onset   Hyperlipidemia Mother    Hypertension Mother    Kidney disease Father    Alzheimer's disease Father    SOCIAL HISTORY Social History   Tobacco Use   Smoking status: Never   Smokeless tobacco: Never  Vaping Use   Vaping  status: Never Used  Substance Use Topics   Alcohol use: Never   Drug use: Never       OPHTHALMIC EXAM:  Base Eye Exam     Visual Acuity (Snellen - Linear)       Right Left   Dist Combine 20/30 -2 20/20 -2   Dist ph  20/20 -2          Tonometry (Tonopen, 1:41 PM)       Right Left   Pressure 20 18         Pupils       Dark Light Shape React APD   Right 4 3 Round Brisk None   Left 4 3 Round Brisk None         Visual Fields (Counting fingers)       Left Right    Full Full         Extraocular Movement       Right Left    Full, Ortho Full, Ortho         Neuro/Psych     Oriented x3: Yes   Mood/Affect: Normal         Dilation     Both eyes: 1.0% Mydriacyl, 2.5% Phenylephrine  @ 1:41 PM           Slit Lamp and Fundus Exam     Slit Lamp Exam       Right Left   Lids/Lashes normal normal   Conjunctiva/Sclera mild melanosis, Trace Injection, +corkscrew vessels nasally mild melanosis   Cornea trace PEE Clear   Anterior Chamber deep, clear, narrow temporal angle deep and quiet   Iris round and dilated, no NVI round and dilated, no NVI   Lens 1+Cortical changes Clear   Anterior Vitreous syneresis syneresis         Fundus Exam       Right Left   Disc pink and sharp; compact; +fibrosis, fine NVD -- regressed; +PPP pink and sharp; compact; +fibrosis w/ fine NVD -- regressed   C/D Ratio 0.2 0.2   Macula good foveal reflex, scattered MA/DBH, punctate exudates and cystic changes IT macula -- slightly increased, fine NVE inferior and temporal macula - regressing, ERM with striae nasally good foveal reflex, ERM with striae, minimal MA/DBH, punctate exudates temporally, scattered fibrosis and NV -- regressing   Vessels attenuated, mild tortuosity, early NVE greatest inferior arcades--regressing attenuated, +NVE -- scattered and regressing, +fibrosis, Tortuous   Periphery attached, 360 MA/DBH, good 360 PRP laser changes and posterior fill in -- some room  for fill in, scattered NV -- regressing attached, 360 MA/DBH, good 360 PRP, scattered NV -- regressing, punctate fibrosis           IMAGING AND PROCEDURES  Imaging and Procedures for 10/04/2024  OCT, Retina - OU - Both Eyes       Right Eye Quality was  good. Central Foveal Thickness: 230. Progression has worsened. Findings include normal foveal contour, no SRF, intraretinal hyper-reflective material, intraretinal fluid, vitreomacular adhesion , preretinal fibrosis (Mild interval increase in IRF/IRHM IT macula; +PRF overlying disc and inferior macula).   Left Eye Quality was good. Central Foveal Thickness: 246. Progression has been stable. Findings include normal foveal contour, no IRF, no SRF, epiretinal membrane, macular pucker, vitreous traction, preretinal fibrosis (Trace cystic changes temporal macula -- stably improved, pre retinal fibrosis overlying disc extending to macula w/ mild traction -- stable).   Notes *Images captured and stored on drive  Diagnosis / Impression:  PDR w/ DME OU OD: Mild interval increase in IRF/IRHM IT macula; +PRF overlying disc and inferior macula OS: Trace cystic changes inferior macula -- stably improved, pre retinal fibrosis overlying disc extending to macula w/ mild traction -- stable  Clinical management:  See below  Abbreviations: NFP - Normal foveal profile. CME - cystoid macular edema. PED - pigment epithelial detachment. IRF - intraretinal fluid. SRF - subretinal fluid. EZ - ellipsoid zone. ERM - epiretinal membrane. ORA - outer retinal atrophy. ORT - outer retinal tubulation. SRHM - subretinal hyper-reflective material. IRHM - intraretinal hyper-reflective material      Fluorescein  Angiography Optos (Transit OD)       Right Eye Progression has improved. Early phase findings include staining, microaneurysm, vascular perfusion defect. Mid/Late phase findings include leakage, staining, microaneurysm, vascular perfusion defect (Interval  improvement in 360 mid zonal NVE with leakage, just mild late focal perivascular leakage, no active NV ).   Left Eye Progression has improved. Early phase findings include leakage, staining, microaneurysm. Mid/Late phase findings include leakage, staining, microaneurysm (Interval improvement in 360 mid zonal NVE with leakage; no active NV, mild late focal perivascular leakage).   Notes **Images stored on drive**  Impression: PDR OU OD: Interval improvement in 360 mid zonal NVE with leakage, just mild late focal perivascular leakage, no active NV  ND:Pwuzmcjo improvement in 360 mid zonal NVE with leakage; no active NV, mild late focal perivascular leakage     Intravitreal Injection, Pharmacologic Agent - OD - Right Eye       Time Out 10/04/2024. 2:38 PM. Confirmed correct patient, procedure, site, and patient consented.   Anesthesia Topical anesthesia was used. Anesthetic medications included Lidocaine  2%, Proparacaine 0.5%.   Procedure Preparation included 5% betadine to ocular surface, eyelid speculum. A (32g) needle was used.   Injection: 2 mg aflibercept  2 MG/0.05ML   Route: Intravitreal, Site: Right Eye   NDC: [id number], Lot: [id number], Expiration date: 12/23/2025, Waste: 0 mL   Post-op Post injection exam found visual acuity of at least counting fingers. The patient tolerated the procedure well. There were no complications. The patient received written and verbal post procedure care education.            ASSESSMENT/PLAN:    ICD-10-CM   1. 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 Current use of insulin  (HCC)  Z79.4     3. Epiretinal membrane (ERM) of both eyes  H35.373     4. Essential hypertension   I10     5. Hypertensive retinopathy of both eyes  H35.033     6. High-risk pregnancy in  second trimester  O09.92      1-3. 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, both eyes - last A1c 11.8 (08.29.25), 10.1 (01.31.25), 10.7 (10.30.24) - delayed to follow up from 4 weeks to 9 weeks (02.05.24 to 04.09.24)  - s/p PRP OD (03.27.23), fill in (05.15.23)  - s/p PRP OS (04.17.23), fill in (10.20.23) - s/p IVA OS #1 (12.08.23), #2 (01.08.24), #3 (04.09.24), #4 (05.21.24), #5 (07.01.24), #6 (8.12.24), #7 (09.27.24), #8 (11.19.24), #9 (12.31.24),  #10 (02.11.25) - s/p IVA OD #1 (02.05.24), #2 (04.09.24), #3 (05.21.24), #4 (07.01.24), #5 (08.12.24), #6 (09.27.24), #7 (11.19.24), #8 (12.31.24), #9 (02.11.25)  - IVA resistance ====================== - s/p IVE OU #1 (03.28.25), #2 (05.09.25), #3 (06.20.25) - s/p IVE OD #4 (07.31.25), #5 (09.04.25), #6 (10.06.25) - BCVA OD 20/20 from 20/25, OS stable at 20/20 - FA 10.20.23 shows 360 midzonal NVE w/ leakage OU - repeat FA (11.12.25) shows Interval improvement in 360 mid zonal NVE with leakage, just mild late focal perivascular leakage, no active NV; OS Interval improvement in 360 mid zonal NVE with leakage; no active NV, mild late focal perivascular leakage - OCT shows OD: Mild interval increase in IRF/IRHM IT macula; +PRF overlying disc and inferior macula; OS: Mild cystic changes inferior macula -- stably improved, pre retinal fibrosis overlying disc extending to macula w/ mild traction -- stable at 5 weeks - recommend IVE today OD #7 (11.12.25) with follow up in 5  weeks - will hold treatment in OS again today -- pt in agreement - pt wishes to proceed with injection OD - RBA of procedure discussed, questions answered - IVE informed consent obtained and signed, 03.28.25 (OU) - see procedure note - approved for Eylea  for 2025 - f/u 5 weeks -- DFE/OCT, possible injection(s)  4-6. History of high risk pregnancy w/ +HTN and hypertensive  retinopathy - s/p Cesarean 06.24.23 (baby boy) - 33 weeks and 1 day, 3lbs, now out of the NICU and at home - history of pre-eclampsia - BP improved since delivery - discussed importance of tight BP control  - continue to monitor  Ophthalmic Meds Ordered this visit:  Meds ordered this encounter  Medications   aflibercept  (EYLEA ) SOLN 2 mg     Return in about 5 weeks (around 11/08/2024) for PDR OU, DFE, OCT, Possible Injxn.  There are no Patient Instructions on file for this visit.  This document serves as a record of services personally performed by Redell JUDITHANN Hans, MD, PhD. It was created on their behalf by Almetta Pesa, an ophthalmic technician. The creation of this record is the provider's dictation and/or activities during the visit.    Electronically signed by: Almetta Pesa, OA, 10/05/24  12:08 AM  This document serves as a record of services personally performed by Redell JUDITHANN Hans, MD, PhD. It was created on their behalf by Wanda GEANNIE Keens, COT an ophthalmic technician. The creation of this record is the provider's dictation and/or activities during the visit.    Electronically signed by:  Wanda GEANNIE Keens, COT  10/05/24 12:08 AM  Redell JUDITHANN Hans, M.D., Ph.D. Diseases & Surgery of the Retina and Vitreous [facility]  I have reviewed the above documentation for accuracy and completeness, and I agree with the above. Redell JUDITHANN Hans, M.D., Ph.D. 10/05/24 12:12 AM   Abbreviations: M myopia (nearsighted); A astigmatism; H hyperopia (farsighted); P presbyopia; Mrx spectacle prescription;  CTL contact lenses; OD right eye; OS left eye; OU both eyes  XT exotropia;  ET esotropia; PEK punctate epithelial keratitis; PEE punctate epithelial erosions; DES dry eye syndrome; MGD meibomian gland dysfunction; ATs artificial tears; PFAT's preservative free artificial tears; NSC nuclear sclerotic cataract; PSC posterior subcapsular cataract; ERM epi-retinal  membrane; PVD posterior vitreous detachment; RD retinal detachment; DM diabetes mellitus; DR diabetic retinopathy; NPDR non-proliferative diabetic retinopathy; PDR proliferative diabetic retinopathy; CSME clinically significant macular edema; DME diabetic macular edema; dbh dot blot hemorrhages; CWS cotton wool spot; POAG primary open angle glaucoma; C/D cup-to-disc ratio; HVF humphrey visual field; GVF goldmann visual field; OCT optical coherence tomography; IOP intraocular pressure; BRVO Branch retinal vein occlusion; CRVO central retinal vein occlusion; CRAO central retinal artery occlusion; BRAO branch retinal artery occlusion; RT retinal tear; SB scleral buckle; PPV pars plana vitrectomy; VH Vitreous hemorrhage; PRP panretinal laser photocoagulation; IVK intravitreal kenalog ; VMT vitreomacular traction; MH Macular hole;  NVD neovascularization of the disc; NVE neovascularization elsewhere; AREDS age related eye disease study; ARMD age related macular degeneration; POAG primary open angle glaucoma; EBMD epithelial/anterior basement membrane dystrophy; ACIOL anterior chamber intraocular lens; IOL intraocular lens; PCIOL posterior chamber intraocular lens; Phaco/IOL phacoemulsification with intraocular lens placement; PRK photorefractive keratectomy; LASIK laser assisted in situ keratomileusis; HTN hypertension; DM diabetes mellitus; COPD chronic obstructive pulmonary disease

## 2024-10-04 ENCOUNTER — Encounter (INDEPENDENT_AMBULATORY_CARE_PROVIDER_SITE_OTHER): Payer: Self-pay | Admitting: Ophthalmology

## 2024-10-04 ENCOUNTER — Ambulatory Visit (INDEPENDENT_AMBULATORY_CARE_PROVIDER_SITE_OTHER): Admitting: Ophthalmology

## 2024-10-04 DIAGNOSIS — H35373 Puckering of macula, bilateral: Secondary | ICD-10-CM

## 2024-10-04 DIAGNOSIS — Z794 Long term (current) use of insulin: Secondary | ICD-10-CM

## 2024-10-04 DIAGNOSIS — O0992 Supervision of high risk pregnancy, unspecified, second trimester: Secondary | ICD-10-CM

## 2024-10-04 DIAGNOSIS — H35033 Hypertensive retinopathy, bilateral: Secondary | ICD-10-CM

## 2024-10-04 DIAGNOSIS — I10 Essential (primary) hypertension: Secondary | ICD-10-CM | POA: Diagnosis not present

## 2024-10-04 DIAGNOSIS — E103513 Type 1 diabetes mellitus with proliferative diabetic retinopathy with macular edema, bilateral: Secondary | ICD-10-CM

## 2024-10-04 MED ORDER — AFLIBERCEPT 2MG/0.05ML IZ SOLN FOR KALEIDOSCOPE
2.0000 mg | INTRAVITREAL | Status: AC | PRN
  Administered 2024-10-04: 2 mg via INTRAVITREAL

## 2024-10-13 ENCOUNTER — Ambulatory Visit: Admitting: Internal Medicine

## 2024-10-16 ENCOUNTER — Ambulatory Visit (INDEPENDENT_AMBULATORY_CARE_PROVIDER_SITE_OTHER): Admitting: Family Medicine

## 2024-10-16 ENCOUNTER — Other Ambulatory Visit (HOSPITAL_COMMUNITY)
Admission: RE | Admit: 2024-10-16 | Discharge: 2024-10-16 | Disposition: A | Discharge status: Home / Self Care | Source: Ambulatory Visit | Attending: Family Medicine | Admitting: Family Medicine

## 2024-10-16 ENCOUNTER — Other Ambulatory Visit: Payer: Self-pay

## 2024-10-16 VITALS — BP 122/83 | HR 89 | Wt 117.0 lb

## 2024-10-16 DIAGNOSIS — N939 Abnormal uterine and vaginal bleeding, unspecified: Secondary | ICD-10-CM

## 2024-10-16 DIAGNOSIS — N760 Acute vaginitis: Secondary | ICD-10-CM | POA: Diagnosis not present

## 2024-10-17 DIAGNOSIS — N939 Abnormal uterine and vaginal bleeding, unspecified: Secondary | ICD-10-CM | POA: Insufficient documentation

## 2024-10-17 DIAGNOSIS — N760 Acute vaginitis: Secondary | ICD-10-CM | POA: Insufficient documentation

## 2024-10-17 NOTE — Assessment & Plan Note (Signed)
 Patient with abnormal discharge, itching.  Wet prep collected today.  Also evaluating for STDs.

## 2024-10-17 NOTE — Progress Notes (Signed)
    Subjective:  Kristin Clay is a 39 y.o. female who presents to the clinic today to discuss fertility issues and irregular bleeding.  Fertility concerns Abnormal uterine bleeding Patient presenting for evaluation because she is trying to get pregnant and is having difficulty.  She reports that her menses are extremely irregular.  Sometimes she will have them every other week and sometimes she will have them every 3 weeks.  She will bleed for 4 days which is regular.  She does not have heavy bleeding.  She strongly desires pregnancy.  Patient also reports she is having abnormal discharge which is thick and white.  She also has itching.  Concern for yeast infection.  Of note patient has type 1 diabetes that is extremely poorly controlled.  She reports that her blood sugars are not good.  Upon chart review last hemoglobin A1c was 10.1.  Her primary care provider also did a full hormonal workup including prolactin, LH, estradiol , FSH, TSH and these all came back normal.    Objective:  Physical Exam: BP 122/83   Pulse 89   Wt 117 lb (53.1 kg)   LMP 10/02/2024 (Within Days)   BMI 21.40 kg/m   Gen: Alert, well-appearing, no acute distress CV: RRR with no murmurs appreciated Pulm: NWOB, CTAB with no crackles, wheezes, or rhonchi GI: Normal bowel sounds present. Soft, Nontender, Nondistended. MSK: no edema, cyanosis, or clubbing noted Skin: warm, dry Neuro: grossly normal, moves all extremities Psych: Normal affect and thought content  No results found for this or any previous visit (from the past 72 hours).   Assessment/Plan:  Abnormal uterine bleeding Patient reporting abnormal menses.  Reports they last 4 days but occur every 2 to 3 weeks.  Patient with extremely poorly controlled diabetes.  Recommended against pregnancy until patient gets her blood glucoses under control.  Discussed that with better glucose control and may help regulate her menses and she more likely to  get pregnant.  Counseled extensively on the dangers of getting pregnant with severely uncontrolled type 1 diabetes.  I will order pelvic ultrasound to evaluate for fibroids and she will follow-up in 3 to 4 weeks.  Acute vaginitis Patient with abnormal discharge, itching.  Wet prep collected today.  Also evaluating for STDs.   Lab Orders  No laboratory test(s) ordered today    No orders of the defined types were placed in this encounter.     Steffan Rover, MD Attending Family Medicine Physician, [facility] for [facility], [facility]   10/17/24 5:04 PM

## 2024-10-17 NOTE — Assessment & Plan Note (Addendum)
 Patient reporting abnormal menses.  Reports they last 4 days but occur every 2 to 3 weeks.  Patient with extremely poorly controlled diabetes.  Recommended against pregnancy until patient gets her blood glucoses under control.  Discussed that with better glucose control and may help regulate her menses and she more likely to get pregnant.  Counseled extensively on the dangers of getting pregnant with severely uncontrolled type 1 diabetes.  I will order pelvic ultrasound to evaluate for fibroids and she will follow-up in 3 to 4 weeks.

## 2024-10-18 ENCOUNTER — Ambulatory Visit: Payer: Self-pay | Admitting: Family Medicine

## 2024-10-18 LAB — CERVICOVAGINAL ANCILLARY ONLY
Bacterial Vaginitis (gardnerella): POSITIVE — AB
Candida Glabrata: NEGATIVE
Candida Vaginitis: POSITIVE — AB
Chlamydia: NEGATIVE
Comment: NEGATIVE
Comment: NEGATIVE
Comment: NEGATIVE
Comment: NEGATIVE
Comment: NEGATIVE
Comment: NORMAL
Neisseria Gonorrhea: NEGATIVE
Trichomonas: NEGATIVE

## 2024-10-18 MED ORDER — METRONIDAZOLE 500 MG PO TABS: 500.0000 mg | ORAL_TABLET | Freq: Two times a day (BID) | ORAL | 0 refills | Status: DC

## 2024-10-18 MED ORDER — FLUCONAZOLE 150 MG PO TABS: 150.0000 mg | ORAL_TABLET | Freq: Once | ORAL | 0 refills | Status: AC

## 2024-10-24 NOTE — Progress Notes (Signed)
 [facility] - Clinic Note  11/07/2024     CHIEF COMPLAINT Patient presents for Retina Follow Up   HISTORY OF PRESENT ILLNESS: Kristin Clay is a 38 y.o. female who presents to the clinic today for:   HPI     Retina Follow Up   Patient presents with  Diabetic Retinopathy.  In both eyes.  This started 5 weeks ago.  I, the attending physician,  performed the HPI with the patient and updated documentation appropriately.        Comments   Pt states no changes in vision. Pt denies FOL/floaters/pain. Pt does not use any drops. BS= 200-250 on average, non-fasting A1c=13.1, 11/01/2024 Pt has upped Novolog  14 units every meal and  Lantus  40 units a day.      Last edited by Valdemar Rogue, MD on 11/07/2024  6:06 PM.      Patient states  [facility] is good  Referring physician: Jacques Sharper MD [address], [address] [place], [place] [address]  HISTORICAL INFORMATION:   Selected notes from the MEDICAL RECORD NUMBER Referred by Dr. Jacques for diabetic retinal evaluation LEE:  Ocular Hx- PMH-pregnancy, DM    CURRENT MEDICATIONS: No current outpatient medications on file. (Ophthalmic Drugs)   No current facility-administered medications for this visit. (Ophthalmic Drugs)   Current Outpatient Medications (Other)  Medication Sig   aspirin  81 MG chewable tablet Chew by mouth.   Continuous Glucose Sensor (DEXCOM G7 SENSOR) MISC 1 Device by Other route as directed. Change sensors once every 10 days.   insulin  aspart (NOVOLOG  FLEXPEN) 100 UNIT/ML FlexPen Inject 12-24 Units into the skin 3 (three) times daily with meals.   insulin  glargine (LANTUS ) 100 UNIT/ML Solostar Pen Inject 32 Units into the skin daily.   Insulin  Pen Needle 32G X 4 MM MISC 1 Device by Does not apply route in the morning, at noon, in the evening, and at bedtime.   labetalol  (NORMODYNE ) 300 MG tablet Take 1 tablet (300 mg total) by mouth 2 (two) times daily.   NIFEdipine  (ADALAT   CC) 60 MG 24 hr tablet TAKE 1 TABLET BY MOUTH 2 TIMES DAILY.   Prenatal Vit-Fe Fumarate-FA (PREPLUS) 27-1 MG TABS Take 1 tablet by mouth daily.   No current facility-administered medications for this visit. (Other)   REVIEW OF SYSTEMS: ROS   Positive for: Endocrine, Eyes Negative for: Constitutional, Gastrointestinal, Neurological, Skin, Genitourinary, Musculoskeletal, HENT, Cardiovascular, Respiratory, Psychiatric, Allergic/Imm, Heme/Lymph Last edited by Elnor Avelina RAMAN, COT on 11/07/2024  2:12 PM.        ALLERGIES Allergies  Allergen Reactions   Porcine (Pork) Protein-Containing Drug Products    PAST MEDICAL HISTORY Past Medical History:  Diagnosis Date   Chronic hypertension    Complication of anesthesia    with hand surgery, local anes did not work, tried twice- had to put her to sleep   Diabetic retinopathy (HCC)    DKA (diabetic ketoacidosis) (HCC) 10/2020   Epidural hematoma (HCC) 07/04/2021   Hypertension in pregnancy, preeclampsia, severe, delivered 12/15/2021   Obstetric vaginal laceration with type 3c third degree perineal laceration 06/29/2021   Pyelonephritis 10/2020   Rh negative status during pregnancy 05/01/2021   s/p Rhogam 05/13/21   Type I diabetes mellitus (HCC)    dx 35yrs ago   UTI (urinary tract infection)    Past Surgical History:  Procedure Laterality Date   CESAREAN SECTION  05/16/2022   Procedure: CESAREAN SECTION;  Surgeon: Zina Jerilynn LABOR, MD;  Location: [facility] LD ORS;  Service: Obstetrics;;   female circumcision Bilateral    clitorectomy as well   FOOT SURGERY  2018   HAND SURGERY  2019   FAMILY HISTORY Family History  Problem Relation Age of Onset   Hyperlipidemia Mother    Hypertension Mother    Kidney disease Father    Alzheimer's disease Father    SOCIAL HISTORY Social History   Tobacco Use   Smoking status: Never   Smokeless tobacco: Never  Vaping Use   Vaping status: Never Used  Substance Use Topics   Alcohol use: Never    Drug use: Never       OPHTHALMIC EXAM:  Base Eye Exam     Visual Acuity (Snellen - Linear)       Right Left   Dist [place] 20/30 -2 20/20   Dist ph [place] 20/25 -2          Tonometry (Tonopen, 2:15 PM)       Right Left   Pressure 17 17         Pupils       Pupils Dark Light Shape React APD   Right PERRL 4 3 Round Minimal None   Left PERRL 4 3 Round Minimal None         Visual Fields       Left Right    Full Full         Extraocular Movement       Right Left    Full, Ortho Full, Ortho         Neuro/Psych     Oriented x3: Yes   Mood/Affect: Normal         Dilation     Both eyes: 1.0% Mydriacyl, 2.5% Phenylephrine  @ 2:15 PM           Slit Lamp and Fundus Exam     Slit Lamp Exam       Right Left   Lids/Lashes normal normal   Conjunctiva/Sclera mild melanosis, Trace Injection, +corkscrew vessels nasally mild melanosis   Cornea trace PEE Clear   Anterior Chamber deep, clear, narrow temporal angle deep and quiet   Iris round and dilated, no NVI round and dilated, no NVI   Lens 1+Cortical changes Clear   Anterior Vitreous syneresis syneresis         Fundus Exam       Right Left   Disc pink and sharp; compact; +fibrosis, fine NVD -- regressed; +PPP pink and sharp; compact; +fibrosis w/ fine NVD -- regressed   C/D Ratio 0.2 0.2   Macula good foveal reflex, scattered MA/DBH, punctate exudates and cystic changes IT macula -- slightly improved, fine NVE inferior and temporal macula - regressing, ERM with striae nasally good foveal reflex, ERM with striae, minimal MA/DBH, punctate exudates temporally, scattered fibrosis and NV -- regressing   Vessels attenuated, mild tortuosity, early NVE greatest inferior arcades--regressing attenuated, +NVE -- scattered and regressing, +fibrosis, Tortuous   Periphery attached, 360 MA/DBH, good 360 PRP laser changes and posterior fill in -- some room for fill in, scattered NV -- regressing attached, 360 MA/DBH,  good 360 PRP, scattered NV -- regressing, punctate fibrosis           IMAGING AND PROCEDURES  Imaging and Procedures for 11/07/2024  OCT, Retina - OU - Both Eyes       Right Eye Quality was good. Central Foveal Thickness: 232. Progression has improved. Findings include normal foveal contour, no SRF, intraretinal  hyper-reflective material, intraretinal fluid, vitreomacular adhesion , preretinal fibrosis (Mild interval improvement in IRF/IRHM IT macula; +PRF overlying disc and inferior macula).   Left Eye Quality was good. Central Foveal Thickness: 255. Progression has been stable. Findings include normal foveal contour, no IRF, no SRF, epiretinal membrane, macular pucker, vitreous traction, preretinal fibrosis (Trace cystic changes temporal macula -- stably improved--no DME, pre retinal fibrosis overlying disc extending to macula w/ mild traction -- stable).   Notes *Images captured and stored on drive  Diagnosis / Impression:  PDR w/ DME OU OD: Mild interval improvement in IRF/IRHM IT macula; +PRF overlying disc and inferior macula OS: Trace cystic changes inferior macula -- stably improved, pre retinal fibrosis overlying disc extending to macula w/ mild traction -- stable  Clinical management:  See below  Abbreviations: NFP - Normal foveal profile. CME - cystoid macular edema. PED - pigment epithelial detachment. IRF - intraretinal fluid. SRF - subretinal fluid. EZ - ellipsoid zone. ERM - epiretinal membrane. ORA - outer retinal atrophy. ORT - outer retinal tubulation. SRHM - subretinal hyper-reflective material. IRHM - intraretinal hyper-reflective material      Intravitreal Injection, Pharmacologic Agent - OD - Right Eye       Time Out 11/07/2024. 3:53 PM. Confirmed correct patient, procedure, site, and patient consented.   Anesthesia Topical anesthesia was used. Anesthetic medications included Lidocaine  2%, Proparacaine 0.5%.   Procedure Preparation included 5%  betadine to ocular surface, eyelid speculum. A (32g) needle was used.   Injection: 2 mg aflibercept  2 MG/0.05ML   Route: Intravitreal, Site: Right Eye   NDC: [id number], Lot: [id number], Expiration date: 12/23/2025, Waste: 0 mL   Post-op Post injection exam found visual acuity of at least counting fingers. The patient tolerated the procedure well. There were no complications. The patient received written and verbal post procedure care education.             ASSESSMENT/PLAN:    ICD-10-CM   1. 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 Current use of insulin  (HCC)  Z79.4     3. Epiretinal membrane (ERM) of both eyes  H35.373     4. Essential hypertension  I10     5. Hypertensive retinopathy of both eyes  H35.033     6. High-risk pregnancy in second trimester  O09.92     7. Proliferative diabetic retinopathy of both eyes with macular edema associated with type 2 diabetes mellitus (HCC)  Z88.6486      1-3. 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, both eyes - last A1c 13.5 (12.10.25), 11.8 (08.29.25), 10.1 (01.31.25), 10.7 (10.30.24) - delayed to follow up from 4 weeks to 9 weeks (02.05.24 to 04.09.24)  - s/p PRP OD (03.27.23), fill in (05.15.23)  - s/p PRP OS (04.17.23), fill in (10.20.23) - s/p IVA OS #1 (12.08.23), #2 (01.08.24), #3 (04.09.24), #4 (05.21.24), #5 (07.01.24), #6 (8.12.24), #7 (09.27.24), #8 (11.19.24), #9 (12.31.24),  #10 (02.11.25) - s/p IVA OD #1 (02.05.24), #2 (04.09.24), #3 (05.21.24), #4 (07.01.24), #5 (08.12.24), #6 (09.27.24), #7 (11.19.24), #8 (12.31.24), #9 (02.11.25)  - IVA resistance ====================== - s/p IVE OU #1 (03.28.25), #2 (05.09.25), #3 (06.20.25) - s/p IVE OD #4 (07.31.25), #5 (09.04.25), #6 (10.06.25), #7  (11.12.25) - BCVA OD 20/25 from 20/20, OS  stable at 20/20 - FA 10.20.23 shows 360 midzonal NVE w/ leakage OU - repeat FA (11.12.25) shows Interval improvement in 360 mid zonal NVE with leakage, just mild late focal perivascular leakage, no active NV; OS Interval improvement in 360 mid zonal NVE with leakage; no active NV, mild late focal perivascular leakage - OCT shows OD: Mild interval improvement in IRF/IRHM IT macula; +PRF overlying disc and inferior macula; OS: Mild cystic changes inferior macula -- stably improved--no DME, pre retinal fibrosis overlying disc extending to macula w/ mild traction -- stable at 5 weeks - recommend IVE today OD #8 (12.16.25) with follow up in 5  weeks - will hold treatment in OS again today -- pt in agreement - pt wishes to proceed with injection OD - RBA of procedure discussed, questions answered - IVE informed consent obtained and signed, 03.28.25 (OU) - see procedure note - approved for Eylea  for 2025 - f/u 5 weeks -- DFE/OCT, possible injection(s)  4-6. History of high risk pregnancy w/ +HTN and hypertensive retinopathy - s/p Cesarean 06.24.23 (baby boy) - 33 weeks and 1 day, 3lbs, now out of the NICU and at home - history of pre-eclampsia - BP improved since delivery - discussed importance of tight BP control  - continue to monitor  Ophthalmic Meds Ordered this visit:  Meds ordered this encounter  Medications   aflibercept  (EYLEA ) SOLN 2 mg     Return in about 5 weeks (around 12/12/2024) for PDR OU, DFE, OCT, Possible Injxn.  There are no Patient Instructions on file for this visit.   This document serves as a record of services personally performed by Redell JUDITHANN Hans, MD, PhD. It was created on their behalf by Wanda GEANNIE Keens, COT an ophthalmic technician. The creation of this record is the provider's dictation and/or activities during the visit.    Electronically signed by:  Wanda GEANNIE Keens, COT  11/07/2024 6:07 PM   This document  serves as a record of services personally performed by Redell JUDITHANN Hans, MD, PhD. It was created on their behalf by Almetta Pesa, an ophthalmic technician. The creation of this record is the provider's dictation and/or activities during the visit.    Electronically signed by: Almetta Pesa, OA, 11/07/2024  6:07 PM  Redell JUDITHANN Hans, M.D., Ph.D. Diseases & Surgery of the Retina and Vitreous [facility]  I have reviewed the above documentation for accuracy and completeness, and I agree with the above. Redell JUDITHANN Hans, M.D., Ph.D. 11/07/2024 6:08 PM   Abbreviations: M myopia (nearsighted); A astigmatism; H hyperopia (farsighted); P presbyopia; Mrx spectacle prescription;  CTL contact lenses; OD right eye; OS left eye; OU both eyes  XT exotropia; ET esotropia; PEK punctate epithelial keratitis; PEE punctate epithelial erosions; DES dry eye syndrome; MGD meibomian gland dysfunction; ATs artificial tears; PFAT's preservative free artificial tears; NSC nuclear sclerotic cataract; PSC posterior subcapsular cataract; ERM epi-retinal membrane; PVD posterior vitreous detachment; RD retinal detachment; DM diabetes mellitus; DR diabetic retinopathy; NPDR non-proliferative diabetic retinopathy; PDR proliferative diabetic retinopathy; CSME clinically significant macular edema; DME diabetic macular edema; dbh dot blot hemorrhages; CWS cotton wool spot; POAG primary open angle glaucoma; C/D cup-to-disc ratio; HVF humphrey visual field; GVF goldmann visual field; OCT optical coherence tomography; IOP intraocular pressure; BRVO Branch retinal vein occlusion; CRVO central retinal vein occlusion; CRAO central retinal artery occlusion; BRAO branch retinal artery occlusion; RT retinal tear; SB scleral buckle; PPV pars plana vitrectomy; VH Vitreous hemorrhage; PRP panretinal laser photocoagulation; IVK  intravitreal kenalog ; VMT vitreomacular traction; MH Macular hole;  NVD neovascularization of the  disc; NVE neovascularization elsewhere; AREDS age related eye disease study; ARMD age related macular degeneration; POAG primary open angle glaucoma; EBMD epithelial/anterior basement membrane dystrophy; ACIOL anterior chamber intraocular lens; IOL intraocular lens; PCIOL posterior chamber intraocular lens; Phaco/IOL phacoemulsification with intraocular lens placement; PRK photorefractive keratectomy; LASIK laser assisted in situ keratomileusis; HTN hypertension; DM diabetes mellitus; COPD chronic obstructive pulmonary disease

## 2024-10-26 NOTE — Telephone Encounter (Addendum)
 Attempted to call pt with [facility] Interpreter [phone].  Voicemail box not set up.   Waddell, RN   ----- Message from Rock LITTIE Shown, RN sent at 10/25/2024 11:21 AM EST -----  Patient has not read mychart message, needs call

## 2024-10-27 NOTE — Telephone Encounter (Addendum)
 Called pt with [facility] [phone].  Pt states she picked up flagyl  and diflucan  last week and has completed the.  She had no questions at this time.   Waddell, RN     ----- Message from Rock LITTIE Shown, RN sent at 10/25/2024 11:21 AM EST -----  Patient has not read mychart message, needs call

## 2024-11-01 ENCOUNTER — Telehealth: Payer: Self-pay | Admitting: Internal Medicine

## 2024-11-01 ENCOUNTER — Other Ambulatory Visit

## 2024-11-01 ENCOUNTER — Ambulatory Visit: Admitting: Internal Medicine

## 2024-11-01 ENCOUNTER — Encounter: Payer: Self-pay | Admitting: Internal Medicine

## 2024-11-01 VITALS — BP 140/82 | Ht 62.0 in | Wt 116.0 lb

## 2024-11-01 DIAGNOSIS — E0590 Thyrotoxicosis, unspecified without thyrotoxic crisis or storm: Secondary | ICD-10-CM | POA: Diagnosis not present

## 2024-11-01 DIAGNOSIS — E1042 Type 1 diabetes mellitus with diabetic polyneuropathy: Secondary | ICD-10-CM | POA: Diagnosis not present

## 2024-11-01 DIAGNOSIS — E1065 Type 1 diabetes mellitus with hyperglycemia: Secondary | ICD-10-CM

## 2024-11-01 DIAGNOSIS — E103592 Type 1 diabetes mellitus with proliferative diabetic retinopathy without macular edema, left eye: Secondary | ICD-10-CM | POA: Diagnosis not present

## 2024-11-01 LAB — POCT GLYCOSYLATED HEMOGLOBIN (HGB A1C): Hemoglobin A1C: 13.5 % — AB (ref 4.0–5.6)

## 2024-11-01 NOTE — Telephone Encounter (Signed)
 Hi Linda,   I am reaching out to see what to do you think about this patient having the iLet pump or the tandem pump?   Historically she was on OmniPod, but she has so much difficulty and due to lack of bolus IQ technology , this did not work for her and her A1c remained above 10%.   She has iPhone 15.    Please let me know what you think, I will put in a referral to see you, because I will be out of the office for the next 2 weeks.  But feel free to let the staff know to order any of those pumps for her   Thank you

## 2024-11-01 NOTE — Patient Instructions (Addendum)
 Lantus  40 units daily  Novolog  14 units with each meal  Novolog  correctional insulin : ADD extra units on insulin  to your meal-time Novolog  dose if your blood sugars are higher than 180. Use the scale below to help guide you:   Blood sugar before meal Number of units to inject  Less than 180 0 unit  181- 230 1 units  231 - 280 2 units  281 - 330 3 units  331 - 380 4 units  381 - 430 5 units  431 - 480 6 units  481 - 530 7 units      HOW TO TREAT LOW BLOOD SUGARS (Blood sugar LESS THAN 70 MG/DL) Please follow the RULE OF 15 for the treatment of hypoglycemia treatment (when your (blood sugars are less than 70 mg/dL)   STEP 1: Take 15 grams of carbohydrates when your blood sugar is low, which includes:  3-4 GLUCOSE TABS  OR 3-4 OZ OF JUICE OR REGULAR SODA OR ONE TUBE OF GLUCOSE GEL    STEP 2: RECHECK blood sugar in 15 MINUTES STEP 3: If your blood sugar is still low at the 15 minute recheck --> then, go back to STEP 1 and treat AGAIN with another 15 grams of carbohydrates

## 2024-11-01 NOTE — Progress Notes (Signed)
 Name: Kristin Clay  MRN/ DOB: [id number], 14-Oct-1986   Age/ Sex: 38 y.o., female    PCP: Tanda Bleacher, MD   Reason for Endocrinology Evaluation: Type 1 Diabetes Mellitus     Date of Initial Endocrinology Visit: 03/18/2022    PATIENT IDENTIFIER: Kristin Clay is a 38 y.o. female with a past medical history of T1 DM. The patient presented for initial endocrinology clinic visit on 03/18/2022 for consultative assistance with her diabetes management.    HPI: [name] was    Diagnosed with T1DM at age 29          Hemoglobin A1c has ranged from 8.4% in 2023, peaking at 13.0% in 2020. Patient has required hospitalization within the last 1 year from hyper or hypoglycemia: Yes 02/2022 with DKA  She is currently 24.5 weeks of gestation with a boy  She has 29 months old baby   Paternal aunts with T1 diabetes   Pt with tingling of hands and feet, she has partial ring finger amputations B/L    S/P delivery 6/27/2023[place]  She was off the insulin  pump 03/2023 until 10/2023 when it was resumed again   I discontinued the pump in August, 2025 with an A1c of 11.8%, and the patient continuously having difficulty obtaining her pods from the pharmacy.  Upon contacting the pharmacy, the patient is only allowed to have 30-day supply.  Her insurance but somehow there was missed pick up opportunities   SUBJECTIVE:   During the last visit (07/21/2024): A1c 11.0%    Today (11/01/24): Kristin Clay is here for a follow up on diabetes management.  She has not been using the Dexcom consistently, patient attributes this due to connectivity and sensor malfunction, patient claims she has been checking glucose at home 3 times daily with a regular meter, but there is no meter today.   She continues to follow-up with Dr. Zamora for proliferative diabetic retinopathy, she continues to receive intra vitreal injections  She was evaluated by GYN for abnormal uterine  bleeding, hormonal checkup was normal.  She was counseled against conception with severely and poorly controlled DM  She is c/o cough, productive in nature for the past 2 months. Associated with fever and vomiting. Daughter with similar symptoms    Has mild palpitations No diarrhea She  states   HOME DIABETES REGIMEN: Lantus  32 units daily- 36 units  NovoLog  12 units with each meal Correction factor: NovoLog  (BG -130/50) TIDQAC    CONTINUOUS GLUCOSE MONITORING RECORD INTERPRETATION    Dates of Recording:11/27-12/08/2024  Sensor description:dexcom  Results statistics:   CGM use % of time 37  Average and SD 342/83  Time in range 8%  % Time Above 180 9  % Time above 250 83  % Time Below target 0   Glycemic patterns summary: Limited data, there is only information for 1 day out of the 14 days downloaded Hyperglycemic episodes all through the day and night  Hypoglycemic episodes occurred N/A  Overnight periods: High   DIABETIC COMPLICATIONS: Microvascular complications:  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 Denies: CKD  Last eye exam: Completed 11/23/2023  Macrovascular complications:   Denies: CAD, PVD, CVA   PAST HISTORY: Past Medical History:  Past Medical History:  Diagnosis Date   Chronic hypertension    Complication of anesthesia    with hand surgery, local anes did not work, tried twice- had to put her to sleep   Diabetic retinopathy (HCC)    DKA (diabetic  ketoacidosis) (HCC) 10/2020   Epidural hematoma (HCC) 07/04/2021   Hypertension in pregnancy, preeclampsia, severe, delivered 12/15/2021   Obstetric vaginal laceration with type 3c third degree perineal laceration 06/29/2021   Pyelonephritis 10/2020   Rh negative status during pregnancy 05/01/2021   s/p Rhogam 05/13/21   Type I diabetes mellitus (HCC)    dx 75yrs ago   UTI (urinary tract infection)    Past Surgical History:  Past Surgical History:  Procedure Laterality Date   CESAREAN SECTION   05/16/2022   Procedure: CESAREAN SECTION;  Surgeon: Zina Jerilynn LABOR, MD;  Location: [facility];  Service: Obstetrics;;   female circumcision Bilateral    clitorectomy as well   FOOT SURGERY  2018   HAND SURGERY  2019    Social History:  reports that she has never smoked. She has never used smokeless tobacco. She reports that she does not drink alcohol and does not use drugs. Family History:  Family History  Problem Relation Age of Onset   Hyperlipidemia Mother    Hypertension Mother    Kidney disease Father    Alzheimer's disease Father      HOME MEDICATIONS: Allergies as of 11/01/2024       Reactions   Porcine (pork) Protein-containing Drug Products         Medication List        Accurate as of November 01, 2024  2:50 PM. If you have any questions, ask your nurse or doctor.          STOP taking these medications    fluconazole  150 MG tablet Commonly known as: DIFLUCAN  Stopped by: Donell PARAS Dariane Natzke   metroNIDAZOLE  500 MG tablet Commonly known as: FLAGYL  Stopped by: Anquinette Pierro J Maisley Hainsworth   Omnipod 5 G7 Pods (Gen 5) Misc Stopped by: Donell PARAS Cordon Gassett       TAKE these medications    aspirin  81 MG chewable tablet Chew by mouth.   Dexcom G7 Sensor Misc 1 Device by Other route as directed. Change sensors once every 10 days.   insulin  glargine 100 UNIT/ML Solostar Pen Commonly known as: LANTUS  Inject 32 Units into the skin daily.   Insulin  Pen Needle 32G X 4 MM Misc 1 Device by Does not apply route in the morning, at noon, in the evening, and at bedtime.   labetalol  300 MG tablet Commonly known as: NORMODYNE  Take 1 tablet (300 mg total) by mouth 2 (two) times daily.   NIFEdipine  60 MG 24 hr tablet Commonly known as: ADALAT  CC TAKE 1 TABLET BY MOUTH 2 TIMES DAILY.   NovoLOG  FlexPen 100 UNIT/ML FlexPen Generic drug: insulin  aspart Inject 12-24 Units into the skin 3 (three) times daily with meals.   PrePLUS 27-1 MG Tabs Take 1 tablet by mouth  daily.         ALLERGIES: Allergies  Allergen Reactions   Porcine (Pork) Protein-Containing Drug Products      REVIEW OF SYSTEMS: A comprehensive ROS was conducted with the patient and is negative except as per HPI    OBJECTIVE:   VITAL SIGNS: BP (!) 140/82   Ht 5' 2 (1.575 m)   Wt 116 lb (52.6 kg)   LMP 10/02/2024 (Within Days)   BMI 21.22 kg/m    PHYSICAL EXAM:  General: Pt appears well and is in NAD  Lungs: Clear with good BS bilat with no rales, rhonchi, or wheezes  Heart: RRR   Extremities:  Lower extremities - No pretibial edema.  Neuro: MS is  good with appropriate affect, pt is alert and Ox3    DM foot exam: 11/01/2024  The skin of the feet is without sores or ulcerations. The pedal pulses are 2+ on right and 2+ on left. The sensation is intact  to a screening 5.07, 10 gram monofilament at the left great toe   DATA REVIEWED:  Lab Results  Component Value Date   HGBA1C 13.5 (A) 11/01/2024   HGBA1C 11.8 (A) 07/21/2024   HGBA1C 11.0 (A) 04/20/2024    Latest Reference Range & Units 11/01/24 13:35  TSH mIU/L 1.42  T4,Free(Direct) 0.8 - 1.8 ng/dL 1.3  Thyroid  stimulating immunoglobulin  Rpt (IP)      Latest Reference Range & Units 07/21/24 12:20  Sodium 135 - 146 mmol/L 134 (L)  Potassium 3.5 - 5.3 mmol/L 4.8  Chloride 98 - 110 mmol/L 101  CO2 20 - 32 mmol/L 24  Glucose 65 - 99 mg/dL 669 (H)  BUN 7 - 25 mg/dL 18  Creatinine 9.49 - 9.02 mg/dL 9.48  Calcium  8.6 - 10.2 mg/dL 9.4  BUN/Creatinine Ratio 6 - 22 (calc) SEE NOTE:  eGFR > OR = 60 mL/min/1.12m2 122    Latest Reference Range & Units 07/21/24 12:20 07/21/24 13:54  Total CHOL/HDL Ratio <5.0 (calc) 4.5   Cholesterol <200 mg/dL 811   HDL Cholesterol > OR = 50 mg/dL 42 (L)   LDL Cholesterol (Calc) mg/dL (calc) 871 (H)   MICROALB/CREAT RATIO <30 mg/g creat  30 (H)  Non-HDL Cholesterol (Calc) <130 mg/dL (calc) 853 (H)   Triglycerides <150 mg/dL 81      Latest Reference Range & Units  07/21/24 12:20  LH mIU/mL 4.3  FSH mIU/mL 5.1  Prolactin ng/mL 4.1  Glucose 65 - 99 mg/dL 669 (H)  Estradiol  pg/mL 166  TSH mIU/L 0.37 (L)      ASSESSMENT / PLAN / RECOMMENDATIONS:   1) Type 1 Diabetes Mellitus, poorly controlled, With neuropathic, and retinopathic and neuropathic complications - Most recent A1c of  13.5%. Goal A1c < 7.0%.    - Patient continues worsening glycemic control -She assures me compliance with multiple daily injections of insulin , but interviewing limited data from CGM it appears that she uses NovoLog  sporadically. -The patient indicates that she has  has not been eating , when I further questioned how that she is above throughout the day, the patient indicated that she has been juicing at home, she gives me examples of orange juice and a mixture of milk/bananas.  We have had multiple conversations with the patient regarding the importance of low-carb diet, she has met with our RD and CDE multiple times, and she has met with multiple RD's throughout the years to discuss the importance of low-carb diet -I have no other option but increase her insulin  - We will see if a tandem pump or a bionic islet pump will be a good option, assuming that she will stay consistent with using them -We discontinued OmniPod because she had difficulty maintaining it on a consistent basis  - I have verified with the pharmacy on 11/01/2024 that the patient was dispensed# 3 sensors of Dexcom in August, September, and November (9 in October) - She was dispensed#10 pens of Lantus  in September and again in November - She was also dispensed NovoLog # 15 pens in September.  No pickup so far   -The patient missed Dexcom pick up in October.  She still has a prescription of NovoLog  pending at the pharmacy that she has not picked up  MEDICATIONS:  Increase Lantus  40 units daily Increase NovoLog  14 units with each meal Continue correction factor: NovoLog  (BG -130/50)  TIDQAC    EDUCATION / INSTRUCTIONS: BG monitoring instructions: Patient is instructed to check her blood sugars 3 times a day. Call [place] [facility] if: BG persistently < 70  I reviewed the Rule of 15 for the treatment of hypoglycemia in detail with the patient. Literature supplied.   2) Diabetic complications:  Eye: Does  have known diabetic retinopathy.  Neuro/ Feet: Does  have known diabetic peripheral neuropathy. Renal: Patient does not have known baseline CKD. She is not on an ACEI/ARB at present.   3) Subclinical Hyperthyroidism:  -Patient is clinically euthyroid - TFTs today have normalized, no intervention - A translated letter was mailed to the patient  Follow-up in 3 months  Signed electronically by: Stefano Redgie Butts, MD  [facility] Endocrinology  [facility] [address] [place] [place], [place] [address] Phone: [phone] FAX: [phone]   CC: Tanda Bleacher, MD [address] [place] [address] Phone: [phone]  Fax: [phone]    Return to Endocrinology clinic as below: Future Appointments  Date Time Provider Department Center  11/07/2024  2:45 PM Valdemar Rogue, MD [facility] None  03/07/2025  9:10 AM Juron Vorhees, Donell Redgie, MD [facility] None

## 2024-11-02 ENCOUNTER — Ambulatory Visit: Payer: Self-pay | Admitting: Internal Medicine

## 2024-11-06 LAB — TRAB (TSH RECEPTOR BINDING ANTIBODY): TRAB: 1.17 IU/L (ref <=2.00)

## 2024-11-06 LAB — TSH: TSH: 1.42 m[IU]/L

## 2024-11-06 LAB — THYROID STIMULATING IMMUNOGLOBULIN: TSI: <89 %{baseline} (ref <140)

## 2024-11-06 LAB — T4, FREE: Free T4: 1.3 ng/dL (ref 0.8–1.8)

## 2024-11-07 ENCOUNTER — Ambulatory Visit (INDEPENDENT_AMBULATORY_CARE_PROVIDER_SITE_OTHER): Admitting: Ophthalmology

## 2024-11-07 ENCOUNTER — Encounter (INDEPENDENT_AMBULATORY_CARE_PROVIDER_SITE_OTHER): Payer: Self-pay | Admitting: Ophthalmology

## 2024-11-07 DIAGNOSIS — Z794 Long term (current) use of insulin: Secondary | ICD-10-CM

## 2024-11-07 DIAGNOSIS — I10 Essential (primary) hypertension: Secondary | ICD-10-CM | POA: Diagnosis not present

## 2024-11-07 DIAGNOSIS — O0992 Supervision of high risk pregnancy, unspecified, second trimester: Secondary | ICD-10-CM

## 2024-11-07 DIAGNOSIS — E103513 Type 1 diabetes mellitus with proliferative diabetic retinopathy with macular edema, bilateral: Secondary | ICD-10-CM

## 2024-11-07 DIAGNOSIS — H35033 Hypertensive retinopathy, bilateral: Secondary | ICD-10-CM | POA: Diagnosis not present

## 2024-11-07 DIAGNOSIS — E113513 Type 2 diabetes mellitus with proliferative diabetic retinopathy with macular edema, bilateral: Secondary | ICD-10-CM

## 2024-11-07 DIAGNOSIS — H35373 Puckering of macula, bilateral: Secondary | ICD-10-CM | POA: Diagnosis not present

## 2024-11-07 MED ORDER — AFLIBERCEPT 2MG/0.05ML IZ SOLN FOR KALEIDOSCOPE
2.0000 mg | INTRAVITREAL | Status: AC | PRN
  Administered 2024-11-07: 18:00:00 2 mg via INTRAVITREAL

## 2024-11-13 ENCOUNTER — Other Ambulatory Visit (INDEPENDENT_AMBULATORY_CARE_PROVIDER_SITE_OTHER): Payer: Self-pay

## 2024-11-21 NOTE — Telephone Encounter (Signed)
 Have tried calling [facility] and not able to leave message, and no one answers.

## 2024-11-21 NOTE — Telephone Encounter (Signed)
 Spoke wiith husband.  Told him to have her call me to discuss new insulin  pump. Telephone number given

## 2024-11-22 ENCOUNTER — Encounter: Admitting: Nutrition

## 2024-11-22 DIAGNOSIS — E1065 Type 1 diabetes mellitus with hyperglycemia: Secondary | ICD-10-CM | POA: Diagnosis present

## 2024-11-22 NOTE — Progress Notes (Signed)
 Patient was shown both the Tandem and Beta Bionic pumps-how they work and how it is worn on the body and how to bolus for meals.  She likes the ease if use of the Beta Bionic pump and has chosen that one.

## 2024-11-22 NOTE — Patient Instructions (Signed)
 Call us  when you get your pump for training:  [phone]

## 2024-11-28 NOTE — Progress Notes (Shared)
 [facility] - Clinic Note  12/12/2024     CHIEF COMPLAINT Patient presents for No chief complaint on file.   HISTORY OF PRESENT ILLNESS: Kristin Clay is a 39 y.o. female who presents to the clinic today for:      Patient states  [facility] is [facility]  Referring physician: Jacques Sharper MD [address] [place], [place] [address]  HISTORICAL INFORMATION:   Selected notes from the MEDICAL RECORD NUMBER Referred by Dr. Jacques for diabetic retinal evaluation LEE:  Ocular Hx- PMH-pregnancy, DM    CURRENT MEDICATIONS: No current outpatient medications on file. (Ophthalmic Drugs)   No current facility-administered medications for this visit. (Ophthalmic Drugs)   Current Outpatient Medications (Other)  Medication Sig   aspirin  81 MG chewable tablet Chew by mouth.   Continuous Glucose Sensor (DEXCOM G7 SENSOR) MISC 1 Device by Other route as directed. Change sensors once every 10 days.   insulin  aspart (NOVOLOG  FLEXPEN) 100 UNIT/ML FlexPen Inject 12-24 Units into the skin 3 (three) times daily with meals.   insulin  glargine (LANTUS ) 100 UNIT/ML Solostar Pen Inject 32 Units into the skin daily.   Insulin  Pen Needle 32G X 4 MM MISC 1 Device by Does not apply route in the morning, at noon, in the evening, and at bedtime.   labetalol  (NORMODYNE ) 300 MG tablet Take 1 tablet (300 mg total) by mouth 2 (two) times daily.   NIFEdipine  (ADALAT  CC) 60 MG 24 hr tablet TAKE 1 TABLET BY MOUTH 2 TIMES DAILY.   Prenatal Vit-Fe Fumarate-FA (PREPLUS) 27-1 MG TABS Take 1 tablet by mouth daily.   No current facility-administered medications for this visit. (Other)   REVIEW OF SYSTEMS:      ALLERGIES Allergies  Allergen Reactions   Porcine (Pork) Protein-Containing Drug Products    PAST MEDICAL HISTORY Past Medical History:  Diagnosis Date   Chronic hypertension    Complication of anesthesia    with hand surgery, local anes did not work, tried  twice- had to put her to sleep   Diabetic retinopathy (HCC)    DKA (diabetic ketoacidosis) (HCC) 10/2020   Epidural hematoma (HCC) 07/04/2021   Hypertension in pregnancy, preeclampsia, severe, delivered 12/15/2021   Obstetric vaginal laceration with type 3c third degree perineal laceration 06/29/2021   Pyelonephritis 10/2020   Rh negative status during pregnancy 05/01/2021   s/p Rhogam 05/13/21   Type I diabetes mellitus (HCC)    dx 89yrs ago   UTI (urinary tract infection)    Past Surgical History:  Procedure Laterality Date   CESAREAN SECTION  05/16/2022   Procedure: CESAREAN SECTION;  Surgeon: Zina Jerilynn LABOR, MD;  Location: [facility];  Service: Obstetrics;;   female circumcision Bilateral    clitorectomy as well   FOOT SURGERY  2018   HAND SURGERY  2019   FAMILY HISTORY Family History  Problem Relation Age of Onset   Hyperlipidemia Mother    Hypertension Mother    Kidney disease Father    Alzheimer's disease Father    SOCIAL HISTORY Social History   Tobacco Use   Smoking status: Never   Smokeless tobacco: Never  Vaping Use   Vaping status: Never Used  Substance Use Topics   Alcohol use: Never   Drug use: Never       OPHTHALMIC EXAM:  Not recorded    IMAGING AND PROCEDURES  Imaging and Procedures for 12/12/2024  ASSESSMENT/PLAN:  No diagnosis found.  1-3. 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, both eyes - last A1c 13.5 (12.10.25), 11.8 (08.29.25), 10.1 (01.31.25), 10.7 (10.30.24) - delayed to follow up from 4 weeks to 9 weeks (02.05.24 to 04.09.24)  - s/p PRP OD (03.27.23), fill in (05.15.23)  - s/p PRP OS (04.17.23), fill in (10.20.23) - s/p IVA OS #1 (12.08.23), #2 (01.08.24), #3 (04.09.24), #4 (05.21.24), #5 (07.01.24), #6 (8.12.24), #7 (09.27.24), #8 (11.19.24), #9 (12.31.24),  #10 (02.11.25) - s/p IVA OD #1 (02.05.24), #2 (04.09.24), #3 (05.21.24), #4 (07.01.24), #5 (08.12.24), #6 (09.27.24), #7 (11.19.24), #8 (12.31.24),  #9 (02.11.25)  - IVA resistance ====================== - s/p IVE OU #1 (03.28.25), #2 (05.09.25), #3 (06.20.25) - s/p IVE OD #4 (07.31.25), #5 (09.04.25), #6 (10.06.25), #7 (11.12.25), #8 (12.16.25) - BCVA OD 20/25 from 20/20, OS stable at 20/20 - FA 10.20.23 shows 360 midzonal NVE w/ leakage OU - repeat FA (11.12.25) shows Interval improvement in 360 mid zonal NVE with leakage, just mild late focal perivascular leakage, no active NV; OS Interval improvement in 360 mid zonal NVE with leakage; no active NV, mild late focal perivascular leakage - OCT shows OD: Mild interval improvement in IRF/IRHM IT macula; +PRF overlying disc and inferior macula; OS: Mild cystic changes inferior macula -- stably improved--no DME, pre retinal fibrosis overlying disc extending to macula w/ mild traction -- stable at 5 weeks - recommend IVE today OD #9 (01.20.26) with follow up in 5  weeks - will hold treatment in OS again today -- pt in agreement - pt wishes to proceed with injection OD - RBA of procedure discussed, questions answered - IVE informed consent obtained and signed, 03.28.25 (OU) - see procedure note - approved for Eylea  for 2025 - f/u 5 weeks -- DFE/OCT, possible injection(s)  4-6. History of high risk pregnancy w/ +HTN and hypertensive retinopathy - s/p Cesarean 06.24.23 (baby boy) - 33 weeks and 1 day, 3lbs, now out of the NICU and at home - history of pre-eclampsia - BP improved since delivery - discussed importance of tight BP control  - continue to monitor  Ophthalmic Meds Ordered this visit:  No orders of the defined types were placed in this encounter.    No follow-ups on file.  There are no Patient Instructions on file for this visit.   This document serves as a record of services personally performed by Redell JUDITHANN Hans, MD, PhD. It was created on their behalf by Wanda GEANNIE Keens, COT an ophthalmic technician. The creation of this record is the provider's dictation and/or  activities during the visit.    Electronically signed by:  Wanda GEANNIE Keens, COT  11/28/2024 12:22 PM  Redell JUDITHANN Hans, M.D., Ph.D. Diseases & Surgery of the Retina and Vitreous [facility]    Abbreviations: M myopia (nearsighted); A astigmatism; H hyperopia (farsighted); P presbyopia; Mrx spectacle prescription;  CTL contact lenses; OD right eye; OS left eye; OU both eyes  XT exotropia; ET esotropia; PEK punctate epithelial keratitis; PEE punctate epithelial erosions; DES dry eye syndrome; MGD meibomian gland dysfunction; ATs artificial tears; PFAT's preservative free artificial tears; NSC nuclear sclerotic cataract; PSC posterior subcapsular cataract; ERM epi-retinal membrane; PVD posterior vitreous detachment; RD retinal detachment; DM diabetes mellitus; DR diabetic retinopathy; NPDR non-proliferative diabetic retinopathy; PDR proliferative diabetic retinopathy; CSME clinically significant macular edema; DME diabetic macular edema; dbh dot blot hemorrhages; CWS cotton wool spot; POAG primary open angle glaucoma; C/D cup-to-disc ratio; HVF humphrey visual field;  GVF goldmann visual field; OCT optical coherence tomography; IOP intraocular pressure; BRVO Branch retinal vein occlusion; CRVO central retinal vein occlusion; CRAO central retinal artery occlusion; BRAO branch retinal artery occlusion; RT retinal tear; SB scleral buckle; PPV pars plana vitrectomy; VH Vitreous hemorrhage; PRP panretinal laser photocoagulation; IVK intravitreal kenalog ; VMT vitreomacular traction; MH Macular hole;  NVD neovascularization of the disc; NVE neovascularization elsewhere; AREDS age related eye disease study; ARMD age related macular degeneration; POAG primary open angle glaucoma; EBMD epithelial/anterior basement membrane dystrophy; ACIOL anterior chamber intraocular lens; IOL intraocular lens; PCIOL posterior chamber intraocular lens; Phaco/IOL phacoemulsification with intraocular lens  placement; PRK photorefractive keratectomy; LASIK laser assisted in situ keratomileusis; HTN hypertension; DM diabetes mellitus; COPD chronic obstructive pulmonary disease

## 2024-12-12 ENCOUNTER — Encounter (INDEPENDENT_AMBULATORY_CARE_PROVIDER_SITE_OTHER): Admitting: Ophthalmology

## 2024-12-12 DIAGNOSIS — O0992 Supervision of high risk pregnancy, unspecified, second trimester: Secondary | ICD-10-CM

## 2024-12-12 DIAGNOSIS — E103513 Type 1 diabetes mellitus with proliferative diabetic retinopathy with macular edema, bilateral: Secondary | ICD-10-CM

## 2024-12-12 DIAGNOSIS — I10 Essential (primary) hypertension: Secondary | ICD-10-CM

## 2024-12-12 DIAGNOSIS — H35373 Puckering of macula, bilateral: Secondary | ICD-10-CM

## 2024-12-12 DIAGNOSIS — H35033 Hypertensive retinopathy, bilateral: Secondary | ICD-10-CM

## 2024-12-12 DIAGNOSIS — Z794 Long term (current) use of insulin: Secondary | ICD-10-CM

## 2024-12-19 NOTE — Progress Notes (Signed)
 [facility] - Clinic Note  12/22/2024     CHIEF COMPLAINT Patient presents for Retina Follow Up   HISTORY OF PRESENT ILLNESS: Kristin Clay is a 39 y.o. female who presents to the clinic today for:   HPI     Retina Follow Up   Patient presents with  Diabetic Retinopathy.  In both eyes.  This started 5 weeks ago.  I, the attending physician,  performed the HPI with the patient and updated documentation appropriately.        Comments   Pt states no changes in vision. Pt denies FOL/floaters/pain. Pt does not use any drops. BS= 200-250 on average, non-fasting A1c=13.1, 11/01/2024      Last edited by Valdemar Rogue, MD on 12/22/2024 12:29 PM.    Patient states the OS has a FB sensation. Her blood sugar is not any better. They have increased her insulin  and is going to get an insulin  pump  Referring physician: Jacques Sharper MD [address], [address] [place], [place] [address]  HISTORICAL INFORMATION:   Selected notes from the MEDICAL RECORD NUMBER Referred by Dr. Jacques for diabetic retinal evaluation LEE:  Ocular Hx- PMH-pregnancy, DM    CURRENT MEDICATIONS: No current outpatient medications on file. (Ophthalmic Drugs)   No current facility-administered medications for this visit. (Ophthalmic Drugs)   Current Outpatient Medications (Other)  Medication Sig   aspirin  81 MG chewable tablet Chew by mouth.   Continuous Glucose Sensor (DEXCOM G7 SENSOR) MISC 1 Device by Other route as directed. Change sensors once every 10 days.   insulin  aspart (NOVOLOG  FLEXPEN) 100 UNIT/ML FlexPen Inject 12-24 Units into the skin 3 (three) times daily with meals.   insulin  glargine (LANTUS ) 100 UNIT/ML Solostar Pen Inject 32 Units into the skin daily.   Insulin  Pen Needle 32G X 4 MM MISC 1 Device by Does not apply route in the morning, at noon, in the evening, and at bedtime.   labetalol  (NORMODYNE ) 300 MG tablet Take 1 tablet (300 mg total) by mouth 2  (two) times daily.   NIFEdipine  (ADALAT  CC) 60 MG 24 hr tablet TAKE 1 TABLET BY MOUTH 2 TIMES DAILY.   Prenatal Vit-Fe Fumarate-FA (PREPLUS) 27-1 MG TABS Take 1 tablet by mouth daily.   No current facility-administered medications for this visit. (Other)   REVIEW OF SYSTEMS:      ALLERGIES Allergies  Allergen Reactions   Porcine (Pork) Protein-Containing Drug Products    PAST MEDICAL HISTORY Past Medical History:  Diagnosis Date   Chronic hypertension    Complication of anesthesia    with hand surgery, local anes did not work, tried twice- had to put her to sleep   Diabetic retinopathy (HCC)    DKA (diabetic ketoacidosis) (HCC) 10/2020   Epidural hematoma (HCC) 07/04/2021   Hypertension in pregnancy, preeclampsia, severe, delivered 12/15/2021   Obstetric vaginal laceration with type 3c third degree perineal laceration 06/29/2021   Pyelonephritis 10/2020   Rh negative status during pregnancy 05/01/2021   s/p Rhogam 05/13/21   Type I diabetes mellitus (HCC)    dx 1yrs ago   UTI (urinary tract infection)    Past Surgical History:  Procedure Laterality Date   CESAREAN SECTION  05/16/2022   Procedure: CESAREAN SECTION;  Surgeon: Zina Jerilynn LABOR, MD;  Location: [facility] LD ORS;  Service: Obstetrics;;   female circumcision Bilateral    clitorectomy as well   FOOT SURGERY  2018   HAND SURGERY  2019  FAMILY HISTORY Family History  Problem Relation Age of Onset   Hyperlipidemia Mother    Hypertension Mother    Kidney disease Father    Alzheimer's disease Father    SOCIAL HISTORY Social History   Tobacco Use   Smoking status: Never   Smokeless tobacco: Never  Vaping Use   Vaping status: Never Used  Substance Use Topics   Alcohol use: Never   Drug use: Never       OPHTHALMIC EXAM:  Base Eye Exam     Visual Acuity (Snellen - Linear)       Right Left   Dist [place] 20/25 +1 20/20 -2   Dist ph [place] NI          Tonometry (Tonopen, 8:44 AM)       Right Left    Pressure 17 17         Pupils       Pupils Dark Light Shape React APD   Right PERRL 4 3 Round Brisk None   Left PERRL 4 3 Round Brisk None         Visual Fields       Left Right    Full Full         Extraocular Movement       Right Left    Full, Ortho Full, Ortho         Neuro/Psych     Oriented x3: Yes   Mood/Affect: Normal         Dilation     Both eyes: 1.0% Mydriacyl, 2.5% Phenylephrine  @ 8:45 AM           Slit Lamp and Fundus Exam     Slit Lamp Exam       Right Left   Lids/Lashes normal normal   Conjunctiva/Sclera mild melanosis, Trace Injection, +corkscrew vessels nasally mild melanosis   Cornea trace PEE Clear   Anterior Chamber deep, clear, narrow temporal angle deep and quiet   Iris round and dilated, no NVI round and dilated, no NVI   Lens 1+Cortical changes Clear   Anterior Vitreous syneresis syneresis         Fundus Exam       Right Left   Disc pink and sharp; compact; +fibrosis, fine NVD -- regressed; +PPP pink and sharp; compact; +fibrosis w/ fine NVD -- regressed   C/D Ratio 0.2 0.2   Macula good foveal reflex, scattered MA/DBH, punctate exudates and cystic changes IT macula, fine NVE inferior and temporal macula - regressing, ERM with striae nasally good foveal reflex, ERM with striae, minimal MA/DBH, punctate exudates temporally, scattered fibrosis and NV -- regressing   Vessels attenuated, mild tortuosity, early NVE greatest inferior arcades--regressing attenuated, +NVE -- scattered and regressing, +fibrosis, Tortuous   Periphery attached, 360 MA/DBH, good 360 PRP laser changes and posterior fill in -- some room for fill in, scattered NV -- regressing attached, 360 MA/DBH, good 360 PRP, scattered NV -- regressing, punctate fibrosis           IMAGING AND PROCEDURES  Imaging and Procedures for 12/22/2024  OCT, Retina - OU - Both Eyes       Right Eye Quality was good. Central Foveal Thickness: 228. Progression has been  stable. Findings include normal foveal contour, no SRF, intraretinal hyper-reflective material, intraretinal fluid, vitreomacular adhesion , preretinal fibrosis (Persistent IRF/IRHM IT macula; +PRF overlying disc and inferior macula).   Left Eye Quality was good. Central Foveal Thickness: 247. Progression has been stable.  Findings include normal foveal contour, no IRF, no SRF, epiretinal membrane, macular pucker, vitreous traction, preretinal fibrosis (Trace cystic changes temporal macula -- stably improved--no DME, pre retinal fibrosis overlying disc extending to macula w/ mild traction -- stable).   Notes *Images captured and stored on drive  Diagnosis / Impression:  PDR w/ DME OU OD: Persistent IRF/IRHM IT macula; +PRF overlying disc and inferior macula OS: Trace cystic changes inferior macula -- stably improved, pre retinal fibrosis overlying disc extending to macula w/ mild traction -- stable  Clinical management:  See below  Abbreviations: NFP - Normal foveal profile. CME - cystoid macular edema. PED - pigment epithelial detachment. IRF - intraretinal fluid. SRF - subretinal fluid. EZ - ellipsoid zone. ERM - epiretinal membrane. ORA - outer retinal atrophy. ORT - outer retinal tubulation. SRHM - subretinal hyper-reflective material. IRHM - intraretinal hyper-reflective material      Intravitreal Injection, Pharmacologic Agent - OD - Right Eye       Time Out 12/22/2024. 9:40 AM. Confirmed correct patient, procedure, site, and patient consented.   Anesthesia Topical anesthesia was used. Anesthetic medications included Lidocaine  2%, Proparacaine 0.5%.   Procedure Preparation included 5% betadine to ocular surface, eyelid speculum. A (32g) needle was used.   Injection: 2 mg aflibercept  2 MG/0.05ML   Route: Intravitreal, Site: Right Eye   NDC: [id number], Lot: [id number], Expiration date: 12/23/2025, Waste: 0 mL   Post-op Post injection exam found visual acuity of at least  counting fingers. The patient tolerated the procedure well. There were no complications. The patient received written and verbal post procedure care education.            ASSESSMENT/PLAN:    ICD-10-CM   1. 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 Current use of insulin  (HCC)  Z79.4     3. Epiretinal membrane (ERM) of both eyes  H35.373     4. Essential hypertension  I10     5. Hypertensive retinopathy of both eyes  H35.033     6. High-risk pregnancy in second trimester  O09.92      1-3. 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, both eyes - last A1c 13.5 (12.10.25), 11.8 (08.29.25), 10.1 (01.31.25), 10.7 (10.30.24) - delayed to follow up from 4 weeks to 9 weeks (02.05.24 to 04.09.24)  - s/p PRP OD (03.27.23), fill in (05.15.23)  - s/p PRP OS (04.17.23), fill in (10.20.23) - s/p IVA OS #1 (12.08.23), #2 (01.08.24), #3 (04.09.24), #4 (05.21.24), #5 (07.01.24), #6 (8.12.24), #7 (09.27.24), #8 (11.19.24), #9 (12.31.24),  #10 (02.11.25) - s/p IVA OD #1 (02.05.24), #2 (04.09.24), #3 (05.21.24), #4 (07.01.24), #5 (08.12.24), #6 (09.27.24), #7 (11.19.24), #8 (12.31.24), #9 (02.11.25)  - IVA resistance ====================== - s/p IVE OU #1 (03.28.25), #2 (05.09.25), #3 (06.20.25) - s/p IVE OD #4 (07.31.25), #5 (09.04.25), #6 (10.06.25), #7 (11.12.25), #8 (12.16.25) - BCVA OD 20/25, OS stable at 20/20 - FA 10.20.23 shows 360 midzonal NVE w/ leakage OU - repeat FA (11.12.25) shows Interval improvement in 360 mid zonal NVE with leakage, just mild late focal perivascular leakage, no active NV; OS Interval improvement in 360 mid zonal NVE with leakage; no active NV, mild late focal perivascular leakage - OCT shows OD: Persistent IRF/IRHM IT macula; +PRF overlying disc  and  inferior macula; OS: Mild cystic changes inferior macula -- stably improved--no DME, pre retinal fibrosis overlying disc extending to macula w/ mild traction -- stable at 6 weeks - recommend IVE today OD #9 (01.27.26) with follow up in 5 weeks - will hold treatment in OS again today -- pt in agreement - pt wishes to proceed with injection OD - RBA of procedure discussed, questions answered - IVE informed consent obtained and signed, 03.28.25 (OU) - see procedure note - approved for Eylea  for 2026 - f/u 5 weeks -- DFE/OCT, possible injection(s)  4-6. History of high risk pregnancy w/ +HTN and hypertensive retinopathy - s/p Cesarean 06.24.23 (baby boy) - 33 weeks and 1 day, 3lbs, now out of the NICU and at home - history of pre-eclampsia - BP improved since delivery - discussed importance of tight BP control  - continue to monitor  Ophthalmic Meds Ordered this visit:  Meds ordered this encounter  Medications   aflibercept  (EYLEA ) SOLN 2 mg     Return in about 5 weeks (around 01/26/2025) for f/u, PDR, DFE, OCT, Possible, IVE, OD.  There are no Patient Instructions on file for this visit.  This document serves as a record of services personally performed by Redell JUDITHANN Hans, MD, PhD. It was created on their behalf by Delon Newness COT, an ophthalmic technician. The creation of this record is the provider's dictation and/or activities during the visit   Electronically signed by: Delon Newness COT 01.27.26 12:31 PM  This document serves as a record of services personally performed by Redell JUDITHANN Hans, MD, PhD. It was created on their behalf by Wanda GEANNIE Keens, COT an ophthalmic technician. The creation of this record is the provider's dictation and/or activities during the visit.    Electronically signed by:  Wanda GEANNIE Keens, COT  12/22/24 12:31 PM  Redell JUDITHANN Hans, M.D., Ph.D. Diseases & Surgery of the Retina and Vitreous [facility]  I have  reviewed the above documentation for accuracy and completeness, and I agree with the above. Redell JUDITHANN Hans, M.D., Ph.D. 12/22/24 12:32 PM   Abbreviations: M myopia (nearsighted); A astigmatism; H hyperopia (farsighted); P presbyopia; Mrx spectacle prescription;  CTL contact lenses; OD right eye; OS left eye; OU both eyes  XT exotropia; ET esotropia; PEK punctate epithelial keratitis; PEE punctate epithelial erosions; DES dry eye syndrome; MGD meibomian gland dysfunction; ATs artificial tears; PFAT's preservative free artificial tears; NSC nuclear sclerotic cataract; PSC posterior subcapsular cataract; ERM epi-retinal membrane; PVD posterior vitreous detachment; RD retinal detachment; DM diabetes mellitus; DR diabetic retinopathy; NPDR non-proliferative diabetic retinopathy; PDR proliferative diabetic retinopathy; CSME clinically significant macular edema; DME diabetic macular edema; dbh dot blot hemorrhages; CWS cotton wool spot; POAG primary open angle glaucoma; C/D cup-to-disc ratio; HVF humphrey visual field; GVF goldmann visual field; OCT optical coherence tomography; IOP intraocular pressure; BRVO Branch retinal vein occlusion; CRVO central retinal vein occlusion; CRAO central retinal artery occlusion; BRAO branch retinal artery occlusion; RT retinal tear; SB scleral buckle; PPV pars plana vitrectomy; VH Vitreous hemorrhage; PRP panretinal laser photocoagulation; IVK intravitreal kenalog ; VMT vitreomacular traction; MH Macular hole;  NVD neovascularization of the disc; NVE neovascularization elsewhere; AREDS age related eye disease study; ARMD age related macular degeneration; POAG primary open angle glaucoma; EBMD epithelial/anterior basement membrane dystrophy; ACIOL anterior chamber intraocular lens; IOL intraocular lens; PCIOL posterior chamber intraocular lens; Phaco/IOL phacoemulsification with intraocular lens placement; PRK photorefractive keratectomy; LASIK laser assisted in situ keratomileusis;  HTN hypertension; DM  diabetes mellitus; COPD chronic obstructive pulmonary disease

## 2024-12-20 ENCOUNTER — Telehealth: Payer: Self-pay | Admitting: Nutrition

## 2024-12-20 NOTE — Telephone Encounter (Signed)
 Paperwork for Betabionic pump faxed to [phone]

## 2024-12-22 ENCOUNTER — Encounter (INDEPENDENT_AMBULATORY_CARE_PROVIDER_SITE_OTHER): Payer: Self-pay | Admitting: Ophthalmology

## 2024-12-22 ENCOUNTER — Ambulatory Visit (INDEPENDENT_AMBULATORY_CARE_PROVIDER_SITE_OTHER): Admitting: Ophthalmology

## 2024-12-22 DIAGNOSIS — E103513 Type 1 diabetes mellitus with proliferative diabetic retinopathy with macular edema, bilateral: Secondary | ICD-10-CM

## 2024-12-22 DIAGNOSIS — I10 Essential (primary) hypertension: Secondary | ICD-10-CM

## 2024-12-22 DIAGNOSIS — H35373 Puckering of macula, bilateral: Secondary | ICD-10-CM | POA: Diagnosis not present

## 2024-12-22 DIAGNOSIS — H35033 Hypertensive retinopathy, bilateral: Secondary | ICD-10-CM

## 2024-12-22 DIAGNOSIS — Z794 Long term (current) use of insulin: Secondary | ICD-10-CM

## 2024-12-22 DIAGNOSIS — O0992 Supervision of high risk pregnancy, unspecified, second trimester: Secondary | ICD-10-CM

## 2024-12-22 MED ORDER — AFLIBERCEPT 2MG/0.05ML IZ SOLN FOR KALEIDOSCOPE
2.0000 mg | INTRAVITREAL | Status: AC | PRN
  Administered 2024-12-22: 2 mg via INTRAVITREAL

## 2024-12-26 ENCOUNTER — Telehealth: Payer: Self-pay | Admitting: Nutrition

## 2024-12-26 NOTE — Telephone Encounter (Signed)
 Patient called saying that she got a call from the Beta bionic pump requesting her charge card number and she was not sure if this was ok.  Sonny contacted and she was not certain if this was normal.  She told me she will have her inside man call her to clarify this.  Patient contacted with this information

## 2025-01-26 ENCOUNTER — Encounter (INDEPENDENT_AMBULATORY_CARE_PROVIDER_SITE_OTHER): Admitting: Ophthalmology

## 2025-03-07 ENCOUNTER — Ambulatory Visit: Admitting: Internal Medicine
